# Patient Record
Sex: Female | Born: 1960 | Race: White | Hispanic: No | Smoking: Current every day smoker
Health system: Southern US, Community
[De-identification: ages and names within clinical notes are randomized; demographics above are authoritative.]

## PROBLEM LIST (undated history)

## (undated) DIAGNOSIS — J439 Emphysema, unspecified: Secondary | ICD-10-CM

## (undated) DIAGNOSIS — F419 Anxiety disorder, unspecified: Secondary | ICD-10-CM

## (undated) DIAGNOSIS — R51 Headache: Secondary | ICD-10-CM

## (undated) DIAGNOSIS — J45909 Unspecified asthma, uncomplicated: Secondary | ICD-10-CM

## (undated) DIAGNOSIS — K8010 Calculus of gallbladder with chronic cholecystitis without obstruction: Secondary | ICD-10-CM

## (undated) DIAGNOSIS — T8859XA Other complications of anesthesia, initial encounter: Secondary | ICD-10-CM

## (undated) DIAGNOSIS — R519 Headache, unspecified: Secondary | ICD-10-CM

## (undated) DIAGNOSIS — M1990 Unspecified osteoarthritis, unspecified site: Secondary | ICD-10-CM

## (undated) DIAGNOSIS — J449 Chronic obstructive pulmonary disease, unspecified: Secondary | ICD-10-CM

## (undated) DIAGNOSIS — I10 Essential (primary) hypertension: Secondary | ICD-10-CM

## (undated) DIAGNOSIS — K4090 Unilateral inguinal hernia, without obstruction or gangrene, not specified as recurrent: Secondary | ICD-10-CM

## (undated) DIAGNOSIS — Z8719 Personal history of other diseases of the digestive system: Secondary | ICD-10-CM

## (undated) DIAGNOSIS — T4145XA Adverse effect of unspecified anesthetic, initial encounter: Secondary | ICD-10-CM

## (undated) DIAGNOSIS — M7989 Other specified soft tissue disorders: Secondary | ICD-10-CM

## (undated) HISTORY — DX: Other specified soft tissue disorders: M79.89

## (undated) HISTORY — PX: TUBAL LIGATION: SHX77

## (undated) HISTORY — DX: Unilateral inguinal hernia, without obstruction or gangrene, not specified as recurrent: K40.90

## (undated) HISTORY — DX: Headache: R51

## (undated) HISTORY — DX: Essential (primary) hypertension: I10

## (undated) HISTORY — DX: Chronic obstructive pulmonary disease, unspecified: J44.9

## (undated) HISTORY — DX: Anxiety disorder, unspecified: F41.9

## (undated) HISTORY — DX: Personal history of other diseases of the digestive system: Z87.19

## (undated) HISTORY — DX: Calculus of gallbladder with chronic cholecystitis without obstruction: K80.10

## (undated) HISTORY — DX: Unspecified osteoarthritis, unspecified site: M19.90

## (undated) HISTORY — DX: Headache, unspecified: R51.9

---

## 1997-12-28 ENCOUNTER — Inpatient Hospital Stay (HOSPITAL_COMMUNITY): Admission: AD | Admit: 1997-12-28 | Discharge: 1997-12-28 | Payer: Self-pay | Admitting: *Deleted

## 1998-03-20 ENCOUNTER — Emergency Department (HOSPITAL_COMMUNITY): Admission: EM | Admit: 1998-03-20 | Discharge: 1998-03-20 | Payer: Self-pay | Admitting: Emergency Medicine

## 1998-03-20 ENCOUNTER — Encounter: Payer: Self-pay | Admitting: Emergency Medicine

## 1998-05-22 ENCOUNTER — Encounter: Admission: RE | Admit: 1998-05-22 | Discharge: 1998-05-22 | Payer: Self-pay | Admitting: Obstetrics

## 1998-07-13 ENCOUNTER — Emergency Department (HOSPITAL_COMMUNITY): Admission: EM | Admit: 1998-07-13 | Discharge: 1998-07-13 | Payer: Self-pay | Admitting: Emergency Medicine

## 1998-07-13 ENCOUNTER — Encounter: Payer: Self-pay | Admitting: Emergency Medicine

## 1998-07-15 ENCOUNTER — Encounter: Admission: RE | Admit: 1998-07-15 | Discharge: 1998-07-15 | Payer: Self-pay | Admitting: Internal Medicine

## 1998-07-29 ENCOUNTER — Encounter: Admission: RE | Admit: 1998-07-29 | Discharge: 1998-07-29 | Payer: Self-pay | Admitting: Hematology and Oncology

## 1999-04-03 ENCOUNTER — Ambulatory Visit (HOSPITAL_COMMUNITY): Admission: RE | Admit: 1999-04-03 | Discharge: 1999-04-03 | Payer: Self-pay | Admitting: *Deleted

## 1999-10-19 ENCOUNTER — Emergency Department (HOSPITAL_COMMUNITY): Admission: EM | Admit: 1999-10-19 | Discharge: 1999-10-19 | Payer: Self-pay | Admitting: Emergency Medicine

## 2000-02-17 ENCOUNTER — Encounter: Payer: Self-pay | Admitting: Emergency Medicine

## 2000-02-17 ENCOUNTER — Emergency Department (HOSPITAL_COMMUNITY): Admission: EM | Admit: 2000-02-17 | Discharge: 2000-02-17 | Payer: Self-pay | Admitting: Emergency Medicine

## 2000-10-23 ENCOUNTER — Emergency Department (HOSPITAL_COMMUNITY): Admission: EM | Admit: 2000-10-23 | Discharge: 2000-10-23 | Payer: Self-pay | Admitting: Emergency Medicine

## 2000-10-23 ENCOUNTER — Encounter: Payer: Self-pay | Admitting: Emergency Medicine

## 2000-11-17 ENCOUNTER — Inpatient Hospital Stay (HOSPITAL_COMMUNITY): Admission: AD | Admit: 2000-11-17 | Discharge: 2000-11-17 | Payer: Self-pay | Admitting: Obstetrics

## 2001-02-20 ENCOUNTER — Emergency Department (HOSPITAL_COMMUNITY): Admission: EM | Admit: 2001-02-20 | Discharge: 2001-02-20 | Payer: Self-pay | Admitting: Emergency Medicine

## 2001-02-20 ENCOUNTER — Encounter: Payer: Self-pay | Admitting: Emergency Medicine

## 2001-07-21 ENCOUNTER — Emergency Department (HOSPITAL_COMMUNITY): Admission: EM | Admit: 2001-07-21 | Discharge: 2001-07-21 | Payer: Self-pay | Admitting: Emergency Medicine

## 2001-10-23 ENCOUNTER — Emergency Department (HOSPITAL_COMMUNITY): Admission: EM | Admit: 2001-10-23 | Discharge: 2001-10-23 | Payer: Self-pay | Admitting: Emergency Medicine

## 2001-10-23 ENCOUNTER — Encounter: Payer: Self-pay | Admitting: Emergency Medicine

## 2002-01-03 ENCOUNTER — Emergency Department (HOSPITAL_COMMUNITY): Admission: EM | Admit: 2002-01-03 | Discharge: 2002-01-03 | Payer: Self-pay | Admitting: Emergency Medicine

## 2002-01-03 ENCOUNTER — Encounter: Payer: Self-pay | Admitting: Emergency Medicine

## 2002-02-10 ENCOUNTER — Encounter: Payer: Self-pay | Admitting: Emergency Medicine

## 2002-02-10 ENCOUNTER — Emergency Department (HOSPITAL_COMMUNITY): Admission: EM | Admit: 2002-02-10 | Discharge: 2002-02-10 | Payer: Self-pay

## 2002-02-20 ENCOUNTER — Ambulatory Visit (HOSPITAL_COMMUNITY): Admission: RE | Admit: 2002-02-20 | Discharge: 2002-02-20 | Payer: Self-pay | Admitting: *Deleted

## 2002-04-27 ENCOUNTER — Other Ambulatory Visit: Admission: RE | Admit: 2002-04-27 | Discharge: 2002-04-27 | Payer: Self-pay | Admitting: Family Medicine

## 2002-07-18 ENCOUNTER — Encounter: Payer: Self-pay | Admitting: Emergency Medicine

## 2002-07-18 ENCOUNTER — Emergency Department (HOSPITAL_COMMUNITY): Admission: EM | Admit: 2002-07-18 | Discharge: 2002-07-18 | Payer: Self-pay | Admitting: Emergency Medicine

## 2003-01-02 ENCOUNTER — Ambulatory Visit (HOSPITAL_COMMUNITY): Admission: RE | Admit: 2003-01-02 | Discharge: 2003-01-02 | Payer: Self-pay | Admitting: Internal Medicine

## 2003-07-02 ENCOUNTER — Emergency Department (HOSPITAL_COMMUNITY): Admission: AD | Admit: 2003-07-02 | Discharge: 2003-07-02 | Payer: Self-pay | Admitting: Family Medicine

## 2004-02-08 ENCOUNTER — Emergency Department (HOSPITAL_COMMUNITY): Admission: EM | Admit: 2004-02-08 | Discharge: 2004-02-08 | Payer: Self-pay | Admitting: Emergency Medicine

## 2004-03-24 ENCOUNTER — Ambulatory Visit: Payer: Self-pay | Admitting: Family Medicine

## 2004-03-31 ENCOUNTER — Ambulatory Visit (HOSPITAL_COMMUNITY): Admission: RE | Admit: 2004-03-31 | Discharge: 2004-03-31 | Payer: Self-pay | Admitting: Family Medicine

## 2004-04-03 ENCOUNTER — Emergency Department (HOSPITAL_COMMUNITY): Admission: EM | Admit: 2004-04-03 | Discharge: 2004-04-03 | Payer: Self-pay | Admitting: Emergency Medicine

## 2004-04-06 ENCOUNTER — Ambulatory Visit (HOSPITAL_COMMUNITY): Admission: RE | Admit: 2004-04-06 | Discharge: 2004-04-06 | Payer: Self-pay | Admitting: Internal Medicine

## 2004-04-13 ENCOUNTER — Encounter: Admission: RE | Admit: 2004-04-13 | Discharge: 2004-04-13 | Payer: Self-pay | Admitting: Family Medicine

## 2004-09-21 ENCOUNTER — Ambulatory Visit: Payer: Self-pay | Admitting: Family Medicine

## 2004-12-28 ENCOUNTER — Ambulatory Visit: Payer: Self-pay | Admitting: Family Medicine

## 2004-12-30 ENCOUNTER — Ambulatory Visit (HOSPITAL_COMMUNITY): Admission: RE | Admit: 2004-12-30 | Discharge: 2004-12-30 | Payer: Self-pay | Admitting: Internal Medicine

## 2005-03-15 ENCOUNTER — Ambulatory Visit: Payer: Self-pay | Admitting: Family Medicine

## 2005-03-15 ENCOUNTER — Other Ambulatory Visit: Admission: RE | Admit: 2005-03-15 | Discharge: 2005-03-15 | Payer: Self-pay | Admitting: Family Medicine

## 2005-04-07 ENCOUNTER — Encounter: Admission: RE | Admit: 2005-04-07 | Discharge: 2005-04-07 | Payer: Self-pay | Admitting: Internal Medicine

## 2005-04-21 ENCOUNTER — Encounter: Admission: RE | Admit: 2005-04-21 | Discharge: 2005-04-21 | Payer: Self-pay | Admitting: Internal Medicine

## 2005-09-13 ENCOUNTER — Ambulatory Visit: Payer: Self-pay | Admitting: Family Medicine

## 2006-01-07 ENCOUNTER — Ambulatory Visit: Payer: Self-pay | Admitting: Family Medicine

## 2006-09-01 ENCOUNTER — Ambulatory Visit: Payer: Self-pay | Admitting: Internal Medicine

## 2006-09-08 ENCOUNTER — Ambulatory Visit: Payer: Self-pay | Admitting: Family Medicine

## 2006-10-13 ENCOUNTER — Ambulatory Visit: Payer: Self-pay | Admitting: Family Medicine

## 2006-11-03 ENCOUNTER — Ambulatory Visit: Payer: Self-pay | Admitting: Internal Medicine

## 2006-11-15 ENCOUNTER — Ambulatory Visit: Payer: Self-pay | Admitting: Internal Medicine

## 2006-11-23 ENCOUNTER — Ambulatory Visit (HOSPITAL_COMMUNITY): Admission: RE | Admit: 2006-11-23 | Discharge: 2006-11-23 | Payer: Self-pay | Admitting: Family Medicine

## 2006-11-23 ENCOUNTER — Ambulatory Visit: Payer: Self-pay | Admitting: Internal Medicine

## 2007-09-02 ENCOUNTER — Emergency Department (HOSPITAL_COMMUNITY): Admission: EM | Admit: 2007-09-02 | Discharge: 2007-09-02 | Payer: Self-pay | Admitting: Family Medicine

## 2007-09-12 ENCOUNTER — Ambulatory Visit: Payer: Self-pay | Admitting: Internal Medicine

## 2007-11-03 ENCOUNTER — Ambulatory Visit (HOSPITAL_COMMUNITY): Admission: RE | Admit: 2007-11-03 | Discharge: 2007-11-03 | Payer: Self-pay | Admitting: Family Medicine

## 2008-02-05 ENCOUNTER — Emergency Department (HOSPITAL_COMMUNITY): Admission: EM | Admit: 2008-02-05 | Discharge: 2008-02-05 | Payer: Self-pay | Admitting: Emergency Medicine

## 2008-02-05 ENCOUNTER — Ambulatory Visit: Payer: Self-pay | Admitting: *Deleted

## 2008-06-14 HISTORY — PX: COLON SURGERY: SHX602

## 2008-06-14 HISTORY — PX: COLOSTOMY: SHX63

## 2008-06-28 ENCOUNTER — Emergency Department (HOSPITAL_COMMUNITY): Admission: EM | Admit: 2008-06-28 | Discharge: 2008-06-28 | Payer: Self-pay | Admitting: Family Medicine

## 2008-07-16 ENCOUNTER — Emergency Department (HOSPITAL_COMMUNITY): Admission: EM | Admit: 2008-07-16 | Discharge: 2008-07-16 | Payer: Self-pay | Admitting: Family Medicine

## 2008-08-19 ENCOUNTER — Encounter: Payer: Self-pay | Admitting: Internal Medicine

## 2008-08-19 ENCOUNTER — Ambulatory Visit: Payer: Self-pay | Admitting: Internal Medicine

## 2008-08-19 DIAGNOSIS — R109 Unspecified abdominal pain: Secondary | ICD-10-CM | POA: Insufficient documentation

## 2008-08-19 LAB — CONVERTED CEMR LAB
Alkaline Phosphatase: 75 units/L (ref 39–117)
BUN: 5 mg/dL — ABNORMAL LOW (ref 6–23)
Band Neutrophils: 0 % (ref 0–10)
Basophils Absolute: 0 10*3/uL (ref 0.0–0.1)
Chloride: 104 meq/L (ref 96–112)
Creatinine, Ser: 0.55 mg/dL (ref 0.40–1.20)
Eosinophils Absolute: 0.4 10*3/uL (ref 0.0–0.7)
Eosinophils Relative: 3 % (ref 0–5)
Glucose, Bld: 81 mg/dL (ref 70–99)
Hemoglobin: 13.5 g/dL (ref 12.0–15.0)
Lymphocytes Relative: 21 % (ref 12–46)
Lymphs Abs: 2.8 10*3/uL (ref 0.7–4.0)
Monocytes Absolute: 1 10*3/uL (ref 0.1–1.0)
Neutrophils Relative %: 67 % (ref 43–77)
Platelets: 332 10*3/uL (ref 150–400)
RDW: 14.4 % (ref 11.5–15.5)
Sodium: 142 meq/L (ref 135–145)
Total Bilirubin: 0.3 mg/dL (ref 0.3–1.2)
WBC: 13 10*3/uL — ABNORMAL HIGH (ref 4.0–10.5)

## 2008-08-20 ENCOUNTER — Telehealth: Payer: Self-pay | Admitting: Internal Medicine

## 2008-08-21 ENCOUNTER — Ambulatory Visit (HOSPITAL_COMMUNITY): Admission: RE | Admit: 2008-08-21 | Discharge: 2008-08-21 | Payer: Self-pay | Admitting: Internal Medicine

## 2008-09-10 ENCOUNTER — Ambulatory Visit: Payer: Self-pay | Admitting: Family Medicine

## 2008-09-11 ENCOUNTER — Encounter: Payer: Self-pay | Admitting: Internal Medicine

## 2008-09-16 ENCOUNTER — Ambulatory Visit: Payer: Self-pay | Admitting: Family Medicine

## 2008-09-16 ENCOUNTER — Encounter (INDEPENDENT_AMBULATORY_CARE_PROVIDER_SITE_OTHER): Payer: Self-pay | Admitting: Adult Health

## 2008-09-16 LAB — CONVERTED CEMR LAB
ALT: 14 units/L (ref 0–35)
AST: 15 units/L (ref 0–37)
Alkaline Phosphatase: 71 units/L (ref 39–117)
CO2: 24 meq/L (ref 19–32)
Chloride: 111 meq/L (ref 96–112)
Cholesterol: 220 mg/dL — ABNORMAL HIGH (ref 0–200)
Creatinine, Ser: 0.66 mg/dL (ref 0.40–1.20)
Eosinophils Absolute: 0.4 10*3/uL (ref 0.0–0.7)
Eosinophils Relative: 5 % (ref 0–5)
Glucose, Bld: 78 mg/dL (ref 70–99)
HDL: 47 mg/dL (ref >39)
LDL Cholesterol: 150 mg/dL — ABNORMAL HIGH (ref 0–99)
Lymphocytes Relative: 35 % (ref 12–46)
Lymphs Abs: 3 10*3/uL (ref 0.7–4.0)
MCV: 89 fL (ref 78.0–100.0)
Monocytes Relative: 9 % (ref 3–12)
Potassium: 4.7 meq/L (ref 3.5–5.3)
Total CHOL/HDL Ratio: 4.7

## 2008-12-04 ENCOUNTER — Encounter (INDEPENDENT_AMBULATORY_CARE_PROVIDER_SITE_OTHER): Payer: Self-pay | Admitting: Adult Health

## 2008-12-04 ENCOUNTER — Ambulatory Visit: Payer: Self-pay | Admitting: Internal Medicine

## 2008-12-04 LAB — CONVERTED CEMR LAB
ALT: 10 units/L (ref 0–35)
AST: 13 units/L (ref 0–37)
Basophils Relative: 0 % (ref 0–1)
Chloride: 107 meq/L (ref 96–112)
Eosinophils Absolute: 0.6 10*3/uL (ref 0.0–0.7)
GC Probe Amp, Genital: NEGATIVE
HCT: 42.6 % (ref 36.0–46.0)
MCHC: 32.4 g/dL (ref 30.0–36.0)
Monocytes Absolute: 0.7 10*3/uL (ref 0.1–1.0)
Potassium: 4.2 meq/L (ref 3.5–5.3)
Sodium: 141 meq/L (ref 135–145)
TSH: 1.337 microintl units/mL (ref 0.350–4.500)
Total Bilirubin: 0.3 mg/dL (ref 0.3–1.2)
Vit D, 25-Hydroxy: 20 ng/mL — ABNORMAL LOW (ref 30–89)

## 2008-12-11 ENCOUNTER — Emergency Department (HOSPITAL_COMMUNITY): Admission: EM | Admit: 2008-12-11 | Discharge: 2008-12-11 | Payer: Self-pay | Admitting: Emergency Medicine

## 2009-01-10 ENCOUNTER — Encounter: Admission: RE | Admit: 2009-01-10 | Discharge: 2009-01-10 | Payer: Self-pay | Admitting: Internal Medicine

## 2009-03-05 ENCOUNTER — Inpatient Hospital Stay (HOSPITAL_COMMUNITY): Admission: EM | Admit: 2009-03-05 | Discharge: 2009-03-12 | Payer: Self-pay | Admitting: Emergency Medicine

## 2009-03-05 ENCOUNTER — Encounter (INDEPENDENT_AMBULATORY_CARE_PROVIDER_SITE_OTHER): Payer: Self-pay | Admitting: General Surgery

## 2009-04-17 ENCOUNTER — Emergency Department (HOSPITAL_COMMUNITY): Admission: EM | Admit: 2009-04-17 | Discharge: 2009-04-17 | Payer: Self-pay | Admitting: Emergency Medicine

## 2009-05-26 ENCOUNTER — Ambulatory Visit: Payer: Self-pay | Admitting: Internal Medicine

## 2009-05-26 ENCOUNTER — Encounter (INDEPENDENT_AMBULATORY_CARE_PROVIDER_SITE_OTHER): Payer: Self-pay | Admitting: Adult Health

## 2009-05-26 LAB — CONVERTED CEMR LAB
Albumin: 4.6 g/dL (ref 3.5–5.2)
CO2: 23 meq/L (ref 19–32)
Calcium: 9.6 mg/dL (ref 8.4–10.5)
Chloride: 104 meq/L (ref 96–112)
Cholesterol: 179 mg/dL (ref 0–200)
Glucose, Bld: 94 mg/dL (ref 70–99)
LDL Cholesterol: 108 mg/dL — ABNORMAL HIGH (ref 0–99)
Sodium: 141 meq/L (ref 135–145)
Total CHOL/HDL Ratio: 3.7
Triglycerides: 117 mg/dL (ref <150)
VLDL: 23 mg/dL (ref 0–40)

## 2009-06-10 ENCOUNTER — Ambulatory Visit: Payer: Self-pay | Admitting: Internal Medicine

## 2009-06-10 ENCOUNTER — Encounter (INDEPENDENT_AMBULATORY_CARE_PROVIDER_SITE_OTHER): Payer: Self-pay | Admitting: Adult Health

## 2009-06-10 LAB — CONVERTED CEMR LAB
BUN: 8 mg/dL (ref 6–23)
Calcium: 9.9 mg/dL (ref 8.4–10.5)

## 2009-07-08 ENCOUNTER — Ambulatory Visit: Payer: Self-pay | Admitting: Internal Medicine

## 2009-07-14 ENCOUNTER — Inpatient Hospital Stay (HOSPITAL_COMMUNITY): Admission: RE | Admit: 2009-07-14 | Discharge: 2009-07-20 | Payer: Self-pay | Admitting: General Surgery

## 2009-07-14 ENCOUNTER — Encounter (INDEPENDENT_AMBULATORY_CARE_PROVIDER_SITE_OTHER): Payer: Self-pay | Admitting: General Surgery

## 2009-07-23 ENCOUNTER — Inpatient Hospital Stay (HOSPITAL_COMMUNITY): Admission: EM | Admit: 2009-07-23 | Discharge: 2009-07-30 | Payer: Self-pay | Admitting: Emergency Medicine

## 2009-08-06 ENCOUNTER — Encounter: Admission: RE | Admit: 2009-08-06 | Discharge: 2009-08-06 | Payer: Self-pay | Admitting: General Surgery

## 2009-08-13 ENCOUNTER — Encounter: Admission: RE | Admit: 2009-08-13 | Discharge: 2009-08-13 | Payer: Self-pay | Admitting: General Surgery

## 2009-09-02 ENCOUNTER — Encounter: Admission: RE | Admit: 2009-09-02 | Discharge: 2009-09-02 | Payer: Self-pay | Admitting: General Surgery

## 2009-09-04 ENCOUNTER — Ambulatory Visit (HOSPITAL_COMMUNITY): Admission: RE | Admit: 2009-09-04 | Discharge: 2009-09-04 | Payer: Self-pay | Admitting: General Surgery

## 2009-09-05 ENCOUNTER — Ambulatory Visit: Payer: Self-pay | Admitting: Internal Medicine

## 2009-09-05 ENCOUNTER — Encounter: Payer: Self-pay | Admitting: Cardiology

## 2009-09-15 ENCOUNTER — Ambulatory Visit (HOSPITAL_COMMUNITY): Admission: RE | Admit: 2009-09-15 | Discharge: 2009-09-15 | Payer: Self-pay | Admitting: General Surgery

## 2009-10-01 ENCOUNTER — Ambulatory Visit: Payer: Self-pay | Admitting: Cardiology

## 2009-10-01 DIAGNOSIS — Z720 Tobacco use: Secondary | ICD-10-CM | POA: Insufficient documentation

## 2009-10-01 DIAGNOSIS — I10 Essential (primary) hypertension: Secondary | ICD-10-CM | POA: Insufficient documentation

## 2009-10-01 DIAGNOSIS — R079 Chest pain, unspecified: Secondary | ICD-10-CM | POA: Insufficient documentation

## 2009-10-02 ENCOUNTER — Telehealth: Payer: Self-pay | Admitting: Cardiology

## 2009-10-02 ENCOUNTER — Telehealth (INDEPENDENT_AMBULATORY_CARE_PROVIDER_SITE_OTHER): Payer: Self-pay | Admitting: *Deleted

## 2009-10-07 LAB — CONVERTED CEMR LAB
BUN: 4 mg/dL — ABNORMAL LOW (ref 6–23)
CO2: 30 meq/L (ref 19–32)
Glucose, Bld: 86 mg/dL (ref 70–99)
Potassium: 3.6 meq/L (ref 3.5–5.1)
Sodium: 143 meq/L (ref 135–145)

## 2009-10-16 ENCOUNTER — Encounter (INDEPENDENT_AMBULATORY_CARE_PROVIDER_SITE_OTHER): Payer: Self-pay | Admitting: *Deleted

## 2009-10-16 ENCOUNTER — Ambulatory Visit: Payer: Self-pay | Admitting: Cardiology

## 2009-10-16 ENCOUNTER — Ambulatory Visit: Payer: Self-pay

## 2010-01-03 ENCOUNTER — Emergency Department (HOSPITAL_COMMUNITY): Admission: EM | Admit: 2010-01-03 | Discharge: 2010-01-04 | Payer: Self-pay | Admitting: Emergency Medicine

## 2010-04-07 ENCOUNTER — Emergency Department (HOSPITAL_COMMUNITY): Admission: EM | Admit: 2010-04-07 | Discharge: 2010-04-07 | Payer: Self-pay | Admitting: Family Medicine

## 2010-05-19 ENCOUNTER — Encounter (INDEPENDENT_AMBULATORY_CARE_PROVIDER_SITE_OTHER): Payer: Self-pay | Admitting: *Deleted

## 2010-05-19 LAB — CONVERTED CEMR LAB
ALT: 12 units/L (ref 0–35)
Albumin: 4.4 g/dL (ref 3.5–5.2)
Alkaline Phosphatase: 73 units/L (ref 39–117)
BUN: 7 mg/dL (ref 6–23)
Basophils Absolute: 0 10*3/uL (ref 0.0–0.1)
Basophils Relative: 1 % (ref 0–1)
CO2: 22 meq/L (ref 19–32)
CRP: 0.2 mg/dL (ref <0.6)
Calcium: 9.4 mg/dL (ref 8.4–10.5)
Chloride: 109 meq/L (ref 96–112)
Eosinophils Absolute: 0.5 10*3/uL (ref 0.0–0.7)
Eosinophils Relative: 6 % — ABNORMAL HIGH (ref 0–5)
HCT: 44.7 % (ref 36.0–46.0)
Lymphs Abs: 2.4 10*3/uL (ref 0.7–4.0)
MCHC: 32.4 g/dL (ref 30.0–36.0)
MCV: 86.3 fL (ref 78.0–100.0)
Monocytes Absolute: 0.5 10*3/uL (ref 0.1–1.0)
Monocytes Relative: 6 % (ref 3–12)
Neutrophils Relative %: 59 % (ref 43–77)
Platelets: 340 10*3/uL (ref 150–400)
Total CK: 64 units/L (ref 7–177)
Total Protein: 6.8 g/dL (ref 6.0–8.3)

## 2010-07-05 ENCOUNTER — Encounter: Payer: Self-pay | Admitting: Family Medicine

## 2010-07-05 ENCOUNTER — Encounter: Payer: Self-pay | Admitting: General Surgery

## 2010-07-06 ENCOUNTER — Encounter: Payer: Self-pay | Admitting: Internal Medicine

## 2010-07-14 NOTE — Progress Notes (Signed)
Summary: Reseach update  Pt was randomized to stress test for Promise Study. Do you want any meds held prior to treadmill. Pt is on HCTZ 25mg  once daily. Loletha Carrow  Appended Document: Reseach update do not need to hold HCTZ

## 2010-07-14 NOTE — Progress Notes (Signed)
Summary: Stress test instructions  Patient notified by phone that received stress test for Promise Study.  Stress test is ordered for Oct 16, 2009 at 2 pm. All instructions for  stress test was given to patient. Patient verbalized understanding. Loletha Carrow

## 2010-07-14 NOTE — Miscellaneous (Signed)
Summary: Orders Update  Clinical Lists Changes  Problems: Added new problem of CHEST PAIN (ICD-786.50) Orders: Added new Test order of TLB-BMP (Basic Metabolic Panel-BMET) (80048-METABOL) - Signed

## 2010-07-14 NOTE — Consult Note (Signed)
Summary: [facility]   Imported By: Marylou Mccoy 11/26/2009 15:02:36  _____________________________________________________________________  External Attachment:    Type:   Image     Comment:   External Document

## 2010-07-14 NOTE — Assessment & Plan Note (Signed)
Summary: np6/intermittent chestpain/ h/o tobacco use./ pt has medicaid...   Primary Provider:  Dr. Rudean Curt  CC:  new patient/intermittent chest pain/pt has not taken her medication yet today.  History of Present Illness: 50 yo with history of HTN, smoking, and hyperlipidemia presents for evaluation of chest pain.  She has had episodes of tightness in her chest for about 6 months.  Nothing in particular brings it on.  It is not exertional.  She gets it a couple of times a week.  It is not related to meals either.  Pain will last about 10 minutes then resolve.  It is of moderate intensity.  She gets mild exertional dyspnea such as after climbing a flight of steps.  She attributes this to smoking.  She does have a history of premature CAD in her mother.   ECG: NSR, Qs in V1 and V2 (nonspecific finding)  Labs (12/10): creatinine 0.66, LDL 108, HDL 48  Current Medications (verified): 1)  Hydrochlorothiazide 25 Mg Tabs (Hydrochlorothiazide) .... Take One Tablet Once Daily  Allergies (verified): 1)  ! Penicillin 2)  ! Codeine 3)  ! Aspirin  Past History:  Past Medical History: 1. HTN 2. Smokes 1 ppd 3. Hyperlipidemia 4. Colonic perforation with polypectomy in 2010.  Had colostomy that has been taken down.  5. Diverticulitis in 2010 6. 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   Vital Signs:  Patient profile:   50 year old female Height:      64 inches Weight:      151 pounds BMI:     26.01 Pulse rate:   80 / minute Pulse rhythm:   regular BP sitting:   132 / 82  (left arm) Cuff size:   regular  Vitals Entered By: Judithe Modest CMA (October 01, 2009 11:42 AM)  Physical Exam  General:  Well developed, well nourished, in no acute distress. Head:  normocephalic and atraumatic Nose:  no  deformity, discharge, inflammation, or lesions Mouth:  Teeth, gums and palate normal. Oral mucosa normal. Neck:  Neck supple, no JVD. No masses, thyromegaly or abnormal cervical nodes. Lungs:  Clear bilaterally to auscultation and percussion. Heart:  Non-displaced PMI, chest non-tender; regular rate and rhythm, S1, S2 without murmurs, rubs or gallops. Carotid upstroke normal, no bruit. Pedals normal pulses. No edema, no varicosities. Abdomen:  Bowel sounds positive; abdomen soft and non-tender without masses, organomegaly, or hernias noted. No hepatosplenomegaly. Msk:  Back normal, normal gait. Muscle strength and tone normal. Extremities:  No clubbing or cyanosis. Neurologic:  Alert and oriented x 3. Skin:  Intact without lesions or rashes. Psych:  Normal affect.   Impression & Recommendations:  Problem # 1:  CHEST PAIN (ICD-786.50) Atypical chest pain in a patient with multiple risk factors for CAD, including HTN, active smoking, hyperlipidemia, and family history of premature CAD in her mother.  I think that she merits risk stratification.  I have spoken with her about the PROMISE trial, which she is interested it.  This will randomize her to coronary CT angiogram or stress myoview. I will have her start ASA 81 mg daily.   Problem # 2:  SMOKER (ICD-305.1) Patient is interested in quitting.  I gave her a prescription for Chantix and encouraged her to quit smoking.  Problem # 3:  HYPERTENSION, UNSPECIFIED (ICD-401.9) BP is reasonably controlled on HCTZ.   Patient Instructions: 1)  Your physician has recommended you make the following change in your medication:  START ASPIRIN 81 MG  TAKE 1 TABLET A DAY.  THIS SHOULD BE BUFFERED OR COATED. 2)  START CHANTIX TO HELP YOU STOP SMOKING. 3)  Your physician discussed the hazards of tobacco use.  Tobacco use cessation is recommended and techniques and options to help you quit were discussed. 4)  TESTING PER PROMISE STUDY 5)  Your physician  recommends that you schedule a follow-up appointment in: 2-3 WEEKS WITH DR. Shirlee Latch AFTER TESTING IS COMPLETED. Prescriptions: CHANTIX CONTINUING MONTH PAK 1 MG TABS (VARENICLINE TARTRATE) TAKE AS DIRECTED  #1 x 1   Entered by:   Lisabeth Devoid RN   Authorized by:   Marca Ancona, MD   Signed by:   Lisabeth Devoid RN on 10/01/2009   Method used:   Faxed to ...       [facility] (retail)       [address] [place], [place]  [address]       Ph: [phone]       Fax: [phone]   RxID:   [id number]

## 2010-07-14 NOTE — Letter (Signed)
Summary: Outpatient Coinsurance Notice  Outpatient Coinsurance Notice   Imported By: Marylou Mccoy 10/17/2009 16:27:38  _____________________________________________________________________  External Attachment:    Type:   Image     Comment:   External Document

## 2010-07-30 ENCOUNTER — Ambulatory Visit (HOSPITAL_BASED_OUTPATIENT_CLINIC_OR_DEPARTMENT_OTHER): Payer: Self-pay

## 2010-08-29 LAB — URINE MICROSCOPIC-ADD ON

## 2010-08-29 LAB — URINALYSIS, ROUTINE W REFLEX MICROSCOPIC
Bilirubin Urine: NEGATIVE
Glucose, UA: NEGATIVE mg/dL
Ketones, ur: 15 mg/dL — AB
Nitrite: NEGATIVE
Protein, ur: NEGATIVE mg/dL
Specific Gravity, Urine: 1.029 (ref 1.005–1.030)

## 2010-08-29 LAB — RAPID URINE DRUG SCREEN, HOSP PERFORMED
Amphetamines: NOT DETECTED
Barbiturates: NOT DETECTED
Tetrahydrocannabinol: POSITIVE — AB

## 2010-08-29 LAB — DIFFERENTIAL
Lymphocytes Relative: 30 % (ref 12–46)
Lymphs Abs: 2.3 10*3/uL (ref 0.7–4.0)
Monocytes Absolute: 1 10*3/uL (ref 0.1–1.0)

## 2010-08-29 LAB — CBC
MCH: 28.4 pg (ref 26.0–34.0)
Platelets: 271 10*3/uL (ref 150–400)
RDW: 15.4 % (ref 11.5–15.5)

## 2010-08-29 LAB — COMPREHENSIVE METABOLIC PANEL
ALT: 14 U/L (ref 0–35)
Albumin: 4 g/dL (ref 3.5–5.2)
Alkaline Phosphatase: 71 U/L (ref 39–117)
CO2: 28 mEq/L (ref 19–32)
Calcium: 9.6 mg/dL (ref 8.4–10.5)
Creatinine, Ser: 0.76 mg/dL (ref 0.4–1.2)
Total Bilirubin: 0.5 mg/dL (ref 0.3–1.2)

## 2010-08-30 LAB — CBC
HCT: 44 % (ref 36.0–46.0)
Hemoglobin: 15.2 g/dL — ABNORMAL HIGH (ref 12.0–15.0)
WBC: 8.3 10*3/uL (ref 4.0–10.5)

## 2010-08-30 LAB — DIFFERENTIAL
Eosinophils Relative: 6 % — ABNORMAL HIGH (ref 0–5)
Lymphocytes Relative: 42 % (ref 12–46)
Lymphs Abs: 3.4 10*3/uL (ref 0.7–4.0)
Monocytes Absolute: 0.7 10*3/uL (ref 0.1–1.0)
Monocytes Relative: 9 % (ref 3–12)

## 2010-08-30 LAB — BASIC METABOLIC PANEL
Chloride: 100 mEq/L (ref 96–112)
GFR calc non Af Amer: >60 mL/min (ref >60)
Potassium: 3.5 mEq/L (ref 3.5–5.1)
Sodium: 136 mEq/L (ref 135–145)

## 2010-09-02 LAB — CBC
HCT: 31 % — ABNORMAL LOW (ref 36.0–46.0)
HCT: 31.4 % — ABNORMAL LOW (ref 36.0–46.0)
HCT: 31.8 % — ABNORMAL LOW (ref 36.0–46.0)
Hemoglobin: 10.4 g/dL — ABNORMAL LOW (ref 12.0–15.0)
Hemoglobin: 10.5 g/dL — ABNORMAL LOW (ref 12.0–15.0)
Hemoglobin: 10.8 g/dL — ABNORMAL LOW (ref 12.0–15.0)
Hemoglobin: 10.8 g/dL — ABNORMAL LOW (ref 12.0–15.0)
Hemoglobin: 10.9 g/dL — ABNORMAL LOW (ref 12.0–15.0)
MCHC: 34.3 g/dL (ref 30.0–36.0)
MCHC: 34.4 g/dL (ref 30.0–36.0)
MCHC: 34.5 g/dL (ref 30.0–36.0)
MCHC: 34.6 g/dL (ref 30.0–36.0)
MCHC: 34.6 g/dL (ref 30.0–36.0)
MCV: 84.8 fL (ref 78.0–100.0)
MCV: 85.1 fL (ref 78.0–100.0)
MCV: 86 fL (ref 78.0–100.0)
Platelets: 310 10*3/uL (ref 150–400)
Platelets: 490 10*3/uL — ABNORMAL HIGH (ref 150–400)
Platelets: 531 10*3/uL — ABNORMAL HIGH (ref 150–400)
RBC: 3.57 MIL/uL — ABNORMAL LOW (ref 3.87–5.11)
RBC: 3.62 MIL/uL — ABNORMAL LOW (ref 3.87–5.11)
RBC: 3.65 MIL/uL — ABNORMAL LOW (ref 3.87–5.11)
RBC: 3.73 MIL/uL — ABNORMAL LOW (ref 3.87–5.11)
RBC: 3.96 MIL/uL (ref 3.87–5.11)
RBC: 3.98 MIL/uL (ref 3.87–5.11)
RDW: 15.2 % (ref 11.5–15.5)
RDW: 15.2 % (ref 11.5–15.5)
RDW: 15.8 % — ABNORMAL HIGH (ref 11.5–15.5)
WBC: 12.2 10*3/uL — ABNORMAL HIGH (ref 4.0–10.5)
WBC: 17.5 10*3/uL — ABNORMAL HIGH (ref 4.0–10.5)
WBC: 9.8 10*3/uL (ref 4.0–10.5)

## 2010-09-02 LAB — BASIC METABOLIC PANEL
BUN: 1 mg/dL — ABNORMAL LOW (ref 6–23)
BUN: 10 mg/dL (ref 6–23)
BUN: 6 mg/dL (ref 6–23)
CO2: 23 mEq/L (ref 19–32)
CO2: 25 mEq/L (ref 19–32)
CO2: 25 mEq/L (ref 19–32)
CO2: 26 mEq/L (ref 19–32)
Calcium: 8.4 mg/dL (ref 8.4–10.5)
Calcium: 8.5 mg/dL (ref 8.4–10.5)
Calcium: 8.9 mg/dL (ref 8.4–10.5)
Chloride: 102 mEq/L (ref 96–112)
Chloride: 104 mEq/L (ref 96–112)
Chloride: 105 mEq/L (ref 96–112)
Chloride: 107 mEq/L (ref 96–112)
Creatinine, Ser: 0.51 mg/dL (ref 0.4–1.2)
GFR calc Af Amer: >60 mL/min (ref >60)
GFR calc Af Amer: >60 mL/min (ref >60)
GFR calc non Af Amer: >60 mL/min (ref >60)
Glucose, Bld: 102 mg/dL — ABNORMAL HIGH (ref 70–99)
Glucose, Bld: 107 mg/dL — ABNORMAL HIGH (ref 70–99)
Glucose, Bld: 109 mg/dL — ABNORMAL HIGH (ref 70–99)
Glucose, Bld: 126 mg/dL — ABNORMAL HIGH (ref 70–99)
Glucose, Bld: 139 mg/dL — ABNORMAL HIGH (ref 70–99)
Glucose, Bld: 90 mg/dL (ref 70–99)
Potassium: 4 mEq/L (ref 3.5–5.1)
Potassium: 4.3 mEq/L (ref 3.5–5.1)
Potassium: 4.4 mEq/L (ref 3.5–5.1)
Potassium: 4.9 mEq/L (ref 3.5–5.1)
Sodium: 133 mEq/L — ABNORMAL LOW (ref 135–145)
Sodium: 136 mEq/L (ref 135–145)
Sodium: 138 mEq/L (ref 135–145)
Sodium: 140 mEq/L (ref 135–145)

## 2010-09-02 LAB — GLUCOSE, CAPILLARY
Glucose-Capillary: 104 mg/dL — ABNORMAL HIGH (ref 70–99)
Glucose-Capillary: 110 mg/dL — ABNORMAL HIGH (ref 70–99)
Glucose-Capillary: 133 mg/dL — ABNORMAL HIGH (ref 70–99)
Glucose-Capillary: 150 mg/dL — ABNORMAL HIGH (ref 70–99)

## 2010-09-02 LAB — CHOLESTEROL, TOTAL
Cholesterol: 103 mg/dL (ref 0–200)
Cholesterol: 140 mg/dL (ref 0–200)

## 2010-09-02 LAB — CULTURE, ROUTINE-ABSCESS

## 2010-09-02 LAB — DIFFERENTIAL
Basophils Absolute: 0 10*3/uL (ref 0.0–0.1)
Eosinophils Absolute: 0.4 10*3/uL (ref 0.0–0.7)
Eosinophils Relative: 0 % (ref 0–5)
Eosinophils Relative: 5 % (ref 0–5)
Lymphocytes Relative: 26 % (ref 12–46)
Lymphocytes Relative: 8 % — ABNORMAL LOW (ref 12–46)
Lymphs Abs: 1.4 10*3/uL (ref 0.7–4.0)
Lymphs Abs: 1.4 10*3/uL (ref 0.7–4.0)
Lymphs Abs: 2.2 10*3/uL (ref 0.7–4.0)
Monocytes Absolute: 0.7 10*3/uL (ref 0.1–1.0)
Monocytes Relative: 6 % (ref 3–12)
Monocytes Relative: 9 % (ref 3–12)
Neutro Abs: 14.9 10*3/uL — ABNORMAL HIGH (ref 1.7–7.7)
Neutro Abs: 9.9 10*3/uL — ABNORMAL HIGH (ref 1.7–7.7)
Neutrophils Relative %: 81 % — ABNORMAL HIGH (ref 43–77)
Neutrophils Relative %: 85 % — ABNORMAL HIGH (ref 43–77)

## 2010-09-02 LAB — WOUND CULTURE

## 2010-09-02 LAB — ANAEROBIC CULTURE

## 2010-09-02 LAB — COMPREHENSIVE METABOLIC PANEL
ALT: 19 U/L (ref 0–35)
ALT: 25 U/L (ref 0–35)
AST: 16 U/L (ref 0–37)
AST: 17 U/L (ref 0–37)
Albumin: 2.6 g/dL — ABNORMAL LOW (ref 3.5–5.2)
Albumin: 2.7 g/dL — ABNORMAL LOW (ref 3.5–5.2)
Alkaline Phosphatase: 87 U/L (ref 39–117)
CO2: 26 mEq/L (ref 19–32)
Calcium: 8.8 mg/dL (ref 8.4–10.5)
Creatinine, Ser: 0.41 mg/dL (ref 0.4–1.2)
GFR calc Af Amer: >60 mL/min (ref >60)
GFR calc non Af Amer: >60 mL/min (ref >60)
Potassium: 3.8 mEq/L (ref 3.5–5.1)
Sodium: 133 mEq/L — ABNORMAL LOW (ref 135–145)
Sodium: 138 mEq/L (ref 135–145)
Total Protein: 5.9 g/dL — ABNORMAL LOW (ref 6.0–8.3)
Total Protein: 6 g/dL (ref 6.0–8.3)

## 2010-09-02 LAB — APTT: aPTT: 37 seconds (ref 24–37)

## 2010-09-02 LAB — TRIGLYCERIDES: Triglycerides: 125 mg/dL (ref <150)

## 2010-09-02 LAB — URINALYSIS, ROUTINE W REFLEX MICROSCOPIC
Glucose, UA: NEGATIVE mg/dL
Hgb urine dipstick: NEGATIVE
Nitrite: NEGATIVE
Protein, ur: NEGATIVE mg/dL
Urobilinogen, UA: 0.2 mg/dL (ref 0.0–1.0)

## 2010-09-02 LAB — PHOSPHORUS
Phosphorus: 3.3 mg/dL (ref 2.3–4.6)
Phosphorus: 4.4 mg/dL (ref 2.3–4.6)

## 2010-09-02 LAB — FUNGUS CULTURE W SMEAR

## 2010-09-02 LAB — MAGNESIUM
Magnesium: 1.9 mg/dL (ref 1.5–2.5)
Magnesium: 2.3 mg/dL (ref 1.5–2.5)

## 2010-09-02 LAB — PROTIME-INR
INR: 1.25 (ref 0.00–1.49)
Prothrombin Time: 15.6 seconds — ABNORMAL HIGH (ref 11.6–15.2)

## 2010-09-16 LAB — POCT URINALYSIS DIP (DEVICE)
Glucose, UA: NEGATIVE mg/dL
Specific Gravity, Urine: 1.02 (ref 1.005–1.030)

## 2010-09-16 LAB — GC/CHLAMYDIA PROBE AMP, GENITAL: GC Probe Amp, Genital: NEGATIVE

## 2010-09-16 LAB — URINE MICROSCOPIC-ADD ON

## 2010-09-16 LAB — WET PREP, GENITAL
Clue Cells Wet Prep HPF POC: NONE SEEN
Yeast Wet Prep HPF POC: NONE SEEN

## 2010-09-16 LAB — URINE CULTURE

## 2010-09-16 LAB — URINALYSIS, ROUTINE W REFLEX MICROSCOPIC
Glucose, UA: NEGATIVE mg/dL
Nitrite: NEGATIVE
Specific Gravity, Urine: 1.029 (ref 1.005–1.030)
pH: 6 (ref 5.0–8.0)

## 2010-09-16 LAB — POCT PREGNANCY, URINE: Preg Test, Ur: NEGATIVE

## 2010-09-18 LAB — URINALYSIS, ROUTINE W REFLEX MICROSCOPIC
Nitrite: NEGATIVE
Specific Gravity, Urine: 1.018 (ref 1.005–1.030)
Urobilinogen, UA: 0.2 mg/dL (ref 0.0–1.0)

## 2010-09-18 LAB — BASIC METABOLIC PANEL
BUN: 2 mg/dL — ABNORMAL LOW (ref 6–23)
BUN: 4 mg/dL — ABNORMAL LOW (ref 6–23)
BUN: <1 mg/dL — ABNORMAL LOW (ref 6–23)
CO2: 25 mEq/L (ref 19–32)
CO2: 26 mEq/L (ref 19–32)
CO2: 28 mEq/L (ref 19–32)
Calcium: 7.8 mg/dL — ABNORMAL LOW (ref 8.4–10.5)
Calcium: 7.9 mg/dL — ABNORMAL LOW (ref 8.4–10.5)
Calcium: 8.5 mg/dL (ref 8.4–10.5)
Chloride: 103 mEq/L (ref 96–112)
Creatinine, Ser: 0.5 mg/dL (ref 0.4–1.2)
Creatinine, Ser: 0.53 mg/dL (ref 0.4–1.2)
Creatinine, Ser: 0.59 mg/dL (ref 0.4–1.2)
Creatinine, Ser: 0.67 mg/dL (ref 0.4–1.2)
GFR calc Af Amer: >60 mL/min (ref >60)
GFR calc Af Amer: >60 mL/min (ref >60)
GFR calc non Af Amer: >60 mL/min (ref >60)
Glucose, Bld: 127 mg/dL — ABNORMAL HIGH (ref 70–99)
Potassium: 3.3 mEq/L — ABNORMAL LOW (ref 3.5–5.1)

## 2010-09-18 LAB — COMPREHENSIVE METABOLIC PANEL
AST: 18 U/L (ref 0–37)
CO2: 22 mEq/L (ref 19–32)
Calcium: 9.5 mg/dL (ref 8.4–10.5)
Creatinine, Ser: 0.66 mg/dL (ref 0.4–1.2)
GFR calc Af Amer: >60 mL/min (ref >60)
GFR calc non Af Amer: >60 mL/min (ref >60)
Total Protein: 7 g/dL (ref 6.0–8.3)

## 2010-09-18 LAB — CBC
HCT: 37.3 % (ref 36.0–46.0)
MCHC: 33.3 g/dL (ref 30.0–36.0)
MCHC: 33.9 g/dL (ref 30.0–36.0)
MCHC: 34 g/dL (ref 30.0–36.0)
MCV: 87.3 fL (ref 78.0–100.0)
MCV: 87.5 fL (ref 78.0–100.0)
Platelets: 261 10*3/uL (ref 150–400)
Platelets: 275 10*3/uL (ref 150–400)
Platelets: 316 10*3/uL (ref 150–400)
Platelets: 338 10*3/uL (ref 150–400)
RBC: 3.86 MIL/uL — ABNORMAL LOW (ref 3.87–5.11)
RBC: 5.05 MIL/uL (ref 3.87–5.11)
RDW: 14.4 % (ref 11.5–15.5)
RDW: 15.1 % (ref 11.5–15.5)
WBC: 11.9 10*3/uL — ABNORMAL HIGH (ref 4.0–10.5)

## 2010-09-18 LAB — MAGNESIUM
Magnesium: 1.9 mg/dL (ref 1.5–2.5)
Magnesium: 1.9 mg/dL (ref 1.5–2.5)

## 2010-09-18 LAB — DIFFERENTIAL
Eosinophils Relative: 6 % — ABNORMAL HIGH (ref 0–5)
Lymphocytes Relative: 36 % (ref 12–46)
Lymphs Abs: 3.5 10*3/uL (ref 0.7–4.0)

## 2010-09-18 LAB — URINE CULTURE

## 2010-09-18 LAB — TYPE AND SCREEN
ABO/RH(D): O POS
Antibody Screen: NEGATIVE

## 2010-09-18 LAB — URINE MICROSCOPIC-ADD ON

## 2010-09-18 LAB — PHOSPHORUS
Phosphorus: 1.5 mg/dL — ABNORMAL LOW (ref 2.3–4.6)
Phosphorus: 3.3 mg/dL (ref 2.3–4.6)

## 2010-09-18 LAB — POTASSIUM: Potassium: 3.6 mEq/L (ref 3.5–5.1)

## 2010-09-18 LAB — POCT PREGNANCY, URINE: Preg Test, Ur: NEGATIVE

## 2010-09-18 LAB — LIPASE, BLOOD: Lipase: 15 U/L (ref 11–59)

## 2010-09-21 LAB — POCT I-STAT, CHEM 8
BUN: 8 mg/dL (ref 6–23)
Chloride: 106 mEq/L (ref 96–112)
Creatinine, Ser: 0.7 mg/dL (ref 0.4–1.2)
Sodium: 141 mEq/L (ref 135–145)
TCO2: 23 mmol/L (ref 0–100)

## 2010-09-21 LAB — CBC
HCT: 40 % (ref 36.0–46.0)
Hemoglobin: 13.5 g/dL (ref 12.0–15.0)
Platelets: 271 10*3/uL (ref 150–400)
RDW: 14.4 % (ref 11.5–15.5)
WBC: 13.4 10*3/uL — ABNORMAL HIGH (ref 4.0–10.5)

## 2010-09-21 LAB — DIFFERENTIAL
Basophils Absolute: 0.1 10*3/uL (ref 0.0–0.1)
Eosinophils Relative: 4 % (ref 0–5)
Lymphocytes Relative: 23 % (ref 12–46)
Lymphs Abs: 3.1 10*3/uL (ref 0.7–4.0)
Monocytes Absolute: 0.9 10*3/uL (ref 0.1–1.0)
Neutro Abs: 8.8 10*3/uL — ABNORMAL HIGH (ref 1.7–7.7)

## 2010-09-21 LAB — URINALYSIS, ROUTINE W REFLEX MICROSCOPIC
Ketones, ur: NEGATIVE mg/dL
Leukocytes, UA: NEGATIVE
Nitrite: NEGATIVE
Protein, ur: NEGATIVE mg/dL
Urobilinogen, UA: 0.2 mg/dL (ref 0.0–1.0)

## 2010-09-21 LAB — URINE CULTURE: Culture: NO GROWTH

## 2010-09-21 LAB — URINE MICROSCOPIC-ADD ON

## 2010-09-28 LAB — POCT URINALYSIS DIP (DEVICE)
Glucose, UA: 100 mg/dL — AB
Specific Gravity, Urine: >=1.03 (ref 1.005–1.030)
pH: 5.5 (ref 5.0–8.0)

## 2010-09-29 LAB — POCT URINALYSIS DIP (DEVICE)
Bilirubin Urine: NEGATIVE
Hgb urine dipstick: NEGATIVE
Ketones, ur: NEGATIVE mg/dL
Protein, ur: NEGATIVE mg/dL
Specific Gravity, Urine: 1.02 (ref 1.005–1.030)
pH: 7 (ref 5.0–8.0)

## 2011-02-14 IMAGING — CT CT ABDOMEN W/ CM
2 of 6 series · 16 of 46 positions shown, 18 images · IV contrast (agent unspecified)
Comparison: 12/11/2008

CT ABDOMEN

CLINICAL DATA: Abdominal pain

CT ABDOMEN AND PELVIS WITH CONTRAST
TECHNIQUE: Multidetector CT imaging of the abdomen and pelvis was
performed using the standard protocol following bolus
administration of intravenous contrast.
Contrast: 80 ml of omni 300

[Series 2: abd pelvis · axial · 0.83mm/px · z∈[-429,-49]mm · 13 of 86 slices shown, 15 images]
[im 5/86  soft-tissue]
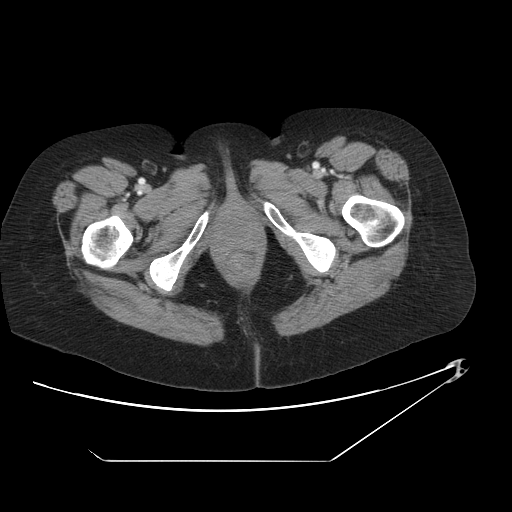
[im 5/86  bone]
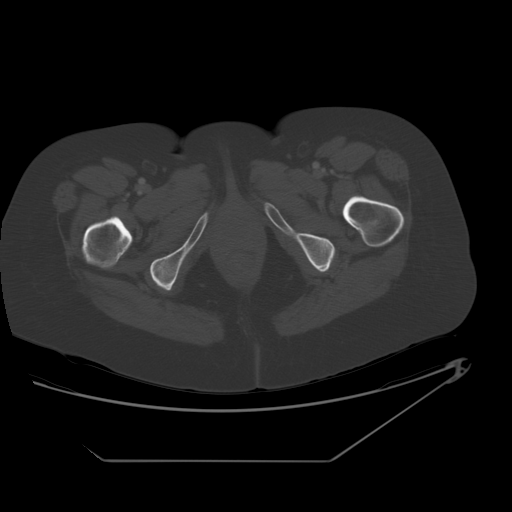
[im 14/86  soft-tissue]
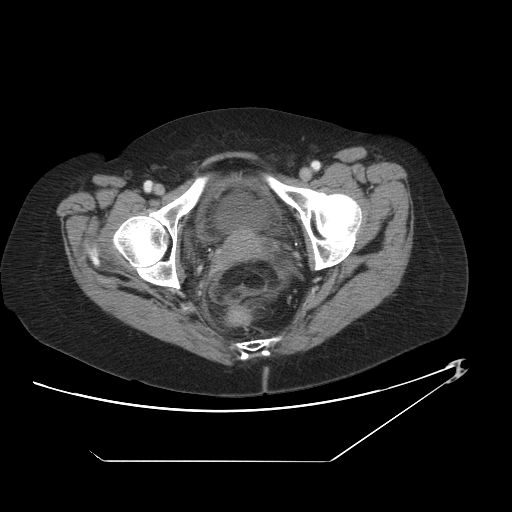
[im 18/86  soft-tissue]
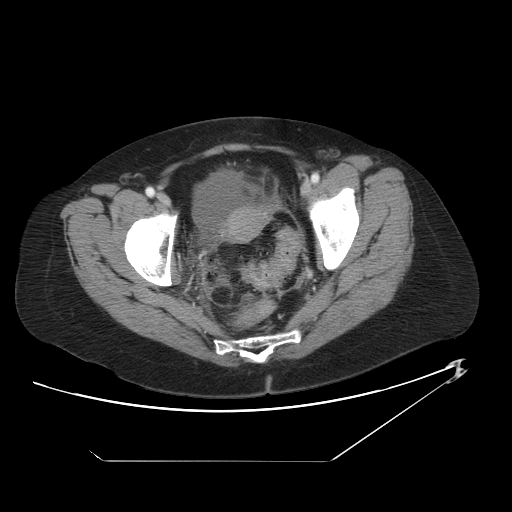
[im 23/86  soft-tissue]
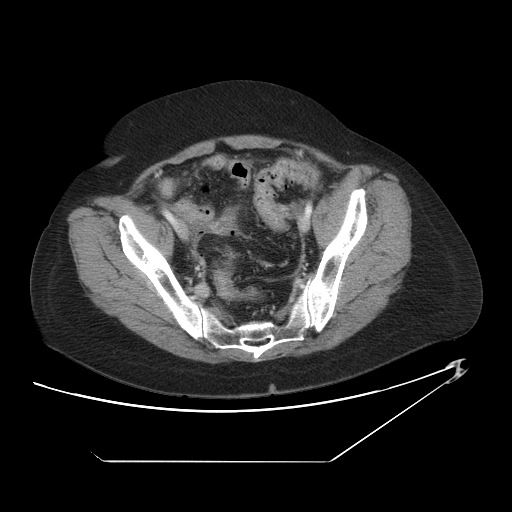
[im 32/86  soft-tissue]
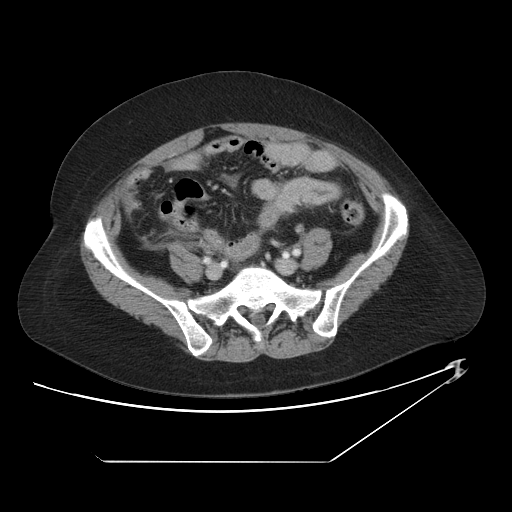
[im 36/86  soft-tissue]
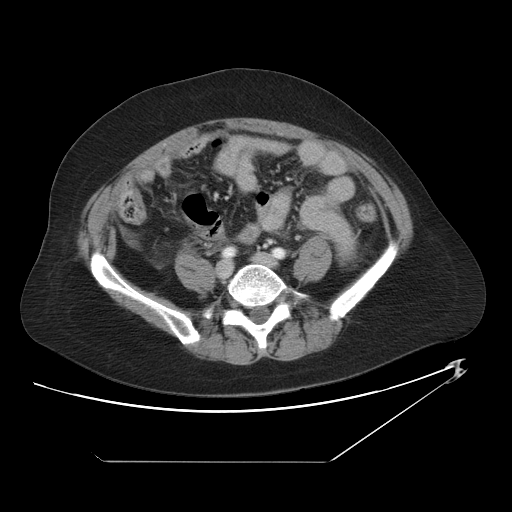
[im 45/86  soft-tissue]
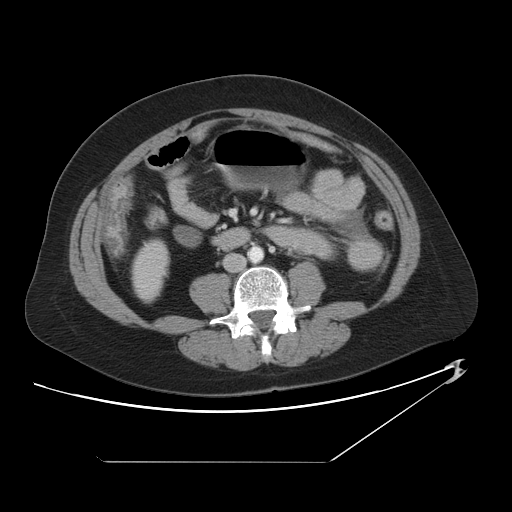
[im 50/86  soft-tissue]
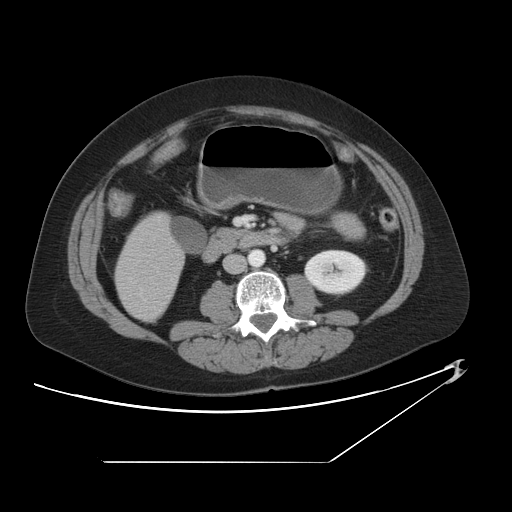
[im 54/86  soft-tissue]
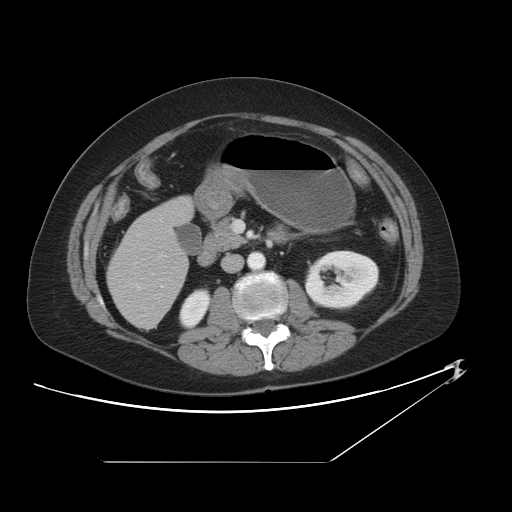
[im 54/86  bone]
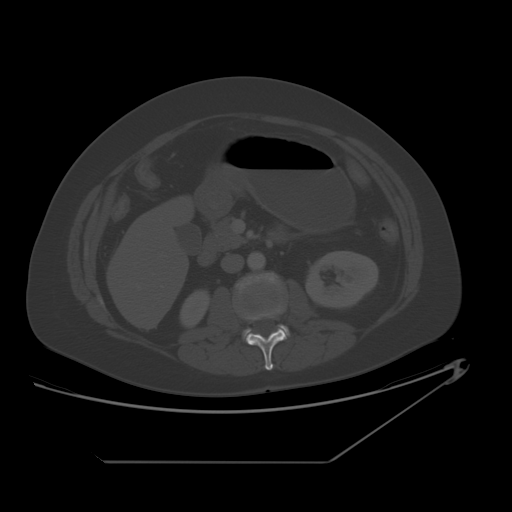
[im 63/86  soft-tissue]
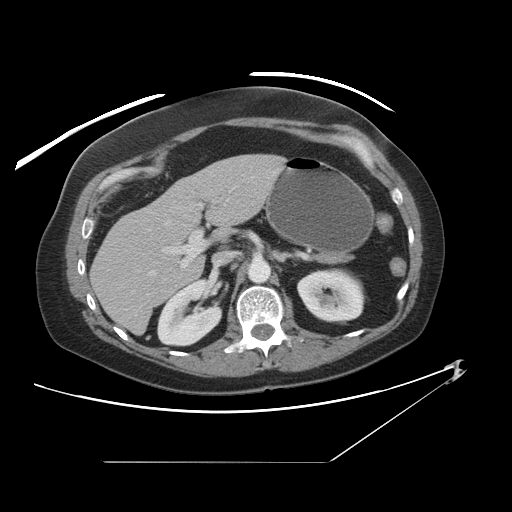
[im 68/86  soft-tissue]
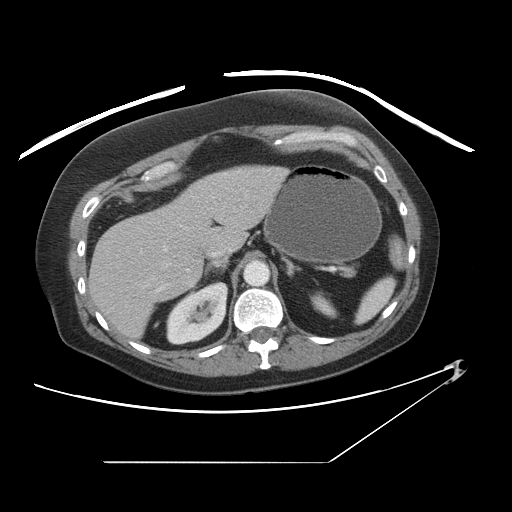
[im 72/86  soft-tissue]
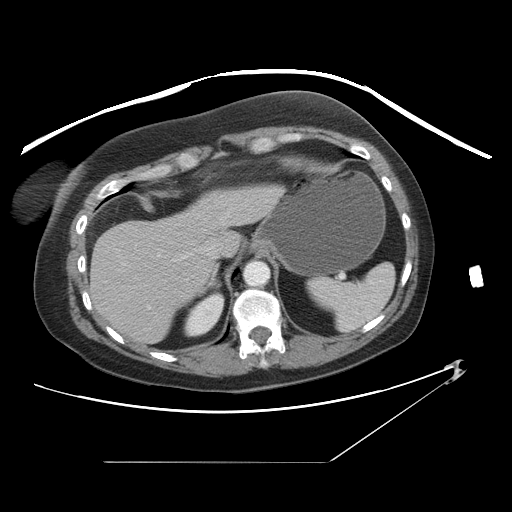
[im 81/86  soft-tissue]
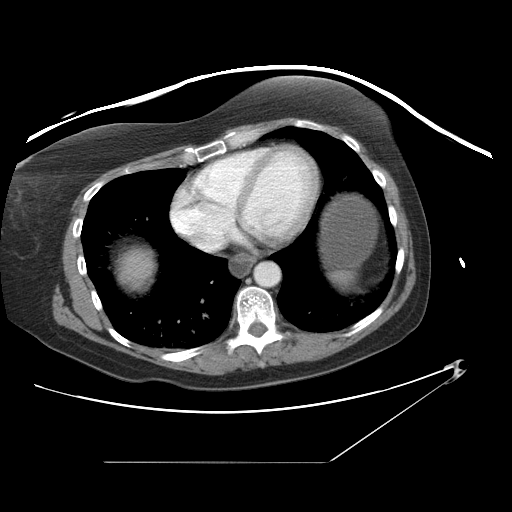

[Series 401: cor · coronal · 0.80mm/px · 3 of 91 slices shown]
[im 23/91  soft-tissue]
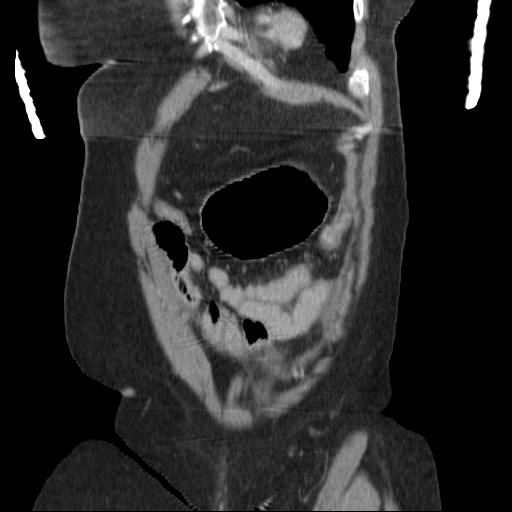
[im 46/91  soft-tissue]
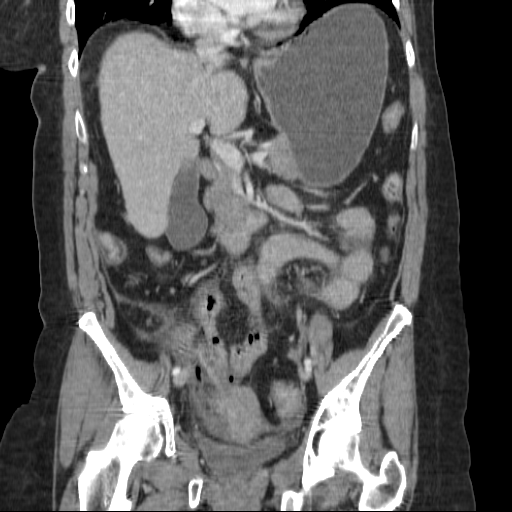
[im 68/91  soft-tissue]
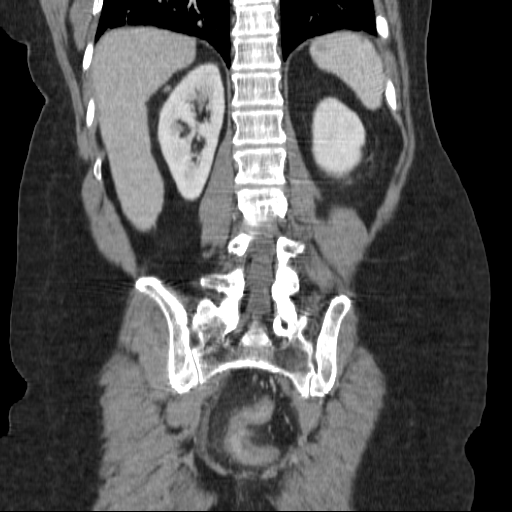

[16 of 46 positions shown; findings below may reference images not displayed]

FINDINGS: Dependent changes are noted within both lung bases.

The adrenal glands are normal.

The pancreas is normal.

The right kidney is normal.

There is a hypodense structure within the left kidney which is
unchanged and likely represents a simple cyst.

The liver is negative.

The gallbladder is normal.

There is no biliary ductal dilatation.

There are no enlarged upper abdominal lymph nodes.

There is mesenteric edema and free fluid within the upper abdomen.

There is mild wall thickening and enhancement involving the entire
colon.

There are multiple tiny foci of gas identified within the right
iliac fossa.

No discrete drainable and abscess identified.
IMPRESSION: 1.  Edema and fluid does throughout the upper abdomen is an
abnormal finding.  In the setting of recent colonoscopy with acute
abdominal pain findings are concerning for peritonitis secondary to
bowel perforation. Tiny foci of gas within the right iliac fossa
supports the diagnosis of bowel perforation secondary to a
colonoscopy.

CT PELVIS
FINDINGS: The site of perforation is felt to be at the
rectosigmoid junction, image 67 of the axial series.  Here, there
is a foci of extraluminal gas directly adjacent to the colon.

There is some of free fluid and mesenteric edema within the pelvis.

The urinary bladder is negative.

The uterus and adnexal structures are unremarkable.

There are no pathologically enlarged pelvic or inguinal lymph
nodes.
IMPRESSION: 1.  Suspect perforation of the rectosigmoid colon.

Critical test results telephoned to [REDACTED] at the time of
interpretation on [DATE] p.m. at 03/05/2009 .

## 2011-03-24 ENCOUNTER — Ambulatory Visit (INDEPENDENT_AMBULATORY_CARE_PROVIDER_SITE_OTHER): Payer: PRIVATE HEALTH INSURANCE | Admitting: Surgery

## 2011-03-24 ENCOUNTER — Encounter (INDEPENDENT_AMBULATORY_CARE_PROVIDER_SITE_OTHER): Payer: Self-pay | Admitting: Surgery

## 2011-03-24 VITALS — BP 124/86 | HR 64 | Temp 97.1°F | Resp 16 | Ht 64.0 in | Wt 159.6 lb

## 2011-03-24 DIAGNOSIS — F17200 Nicotine dependence, unspecified, uncomplicated: Secondary | ICD-10-CM

## 2011-03-24 DIAGNOSIS — K432 Incisional hernia without obstruction or gangrene: Secondary | ICD-10-CM

## 2011-03-24 NOTE — Progress Notes (Signed)
Subjective:     Patient ID: Maria Oliver, female   DOB: [birth year], 50 y.o.   MRN: [id number]  HPI  Patient Care Team: Collene Leyden as PCP - General (Family Medicine)  This patient is a 50 y.o.female who presents today for surgical evaluation.   Reason for visit: Incisional hernia. Status post numerous surgeries.  The patient is a smoking female who required emergency colectomy and ostomy for a perforation after polypectomy during colonoscopy in Sept 2010. She gradually recovered. She underwent colostomy takedown [id number]. This was complicated by a wound infection. She also had a delayed anastomotic leak that required perutaneous drainage and IV nutrition. Eventually it healed by Apr 2011. It was noted during the ostomy takedown she had a incisional hernia which was primarily repaired given the fact it was a contaminated case.  The patient notes that she has had incisional swelling since the surgery & has has worsened over the past year. She was diagnosed with a hernia and came to me for evaluation. She has a bowel movement every day. She claims she can walk 20 minutes without much problem. Some limited mobility secondary to left knee pain & arthritis.  Past Medical History  Diagnosis Date  . Arthritis   . COPD (chronic obstructive pulmonary disease)   . Hypertension   . Generalized headaches   . Leg swelling   . Abdominal pain     Past Surgical History  Procedure Date  . Hernia repair 2010  . Colon surgery 2010    per patient "colon take down"    History   Social History  . Marital Status: Widowed    Spouse Name: N/A    Number of Children: N/A  . Years of Education: N/A   Occupational History  . Not on file.   Social History Main Topics  . Smoking status: Current Everyday Smoker -- 1.0 packs/day  . Smokeless tobacco: Never Used  . Alcohol Use: No  . Drug Use: No  . Sexually Active: Not on file   Other Topics Concern  . Not on file   Social History Narrative    . No narrative on file    Family History  Problem Relation Age of Onset  . Heart disease Mother 88  . Alzheimer's disease Father 23    Current outpatient prescriptions:hydrochlorothiazide (HYDRODIURIL) 25 MG tablet, Take 25 mg by mouth daily.  , Disp: , Rfl: ;  Multiple Vitamin (MULTIVITAMIN) tablet, Take 1 tablet by mouth daily.  , Disp: , Rfl:   Allergies  Allergen Reactions  . Aspirin     REACTION: GI upset  . Codeine     REACTION: GI upset  . Penicillins     REACTION: rash       Review of Systems  Constitutional: Negative for fever, chills, diaphoresis, appetite change and fatigue.  HENT: Negative for ear pain, sore throat, trouble swallowing, neck pain and ear discharge.   Eyes: Negative for photophobia, discharge and visual disturbance.  Respiratory: Negative for cough, choking, chest tightness and shortness of breath.   Cardiovascular: Negative for chest pain and palpitations.  Gastrointestinal: Negative for nausea, vomiting, abdominal pain, diarrhea, constipation, blood in stool, abdominal distention, anal bleeding and rectal pain.  Genitourinary: Negative for dysuria, frequency and difficulty urinating.  Musculoskeletal: Positive for arthralgias. Negative for myalgias and gait problem.  Skin: Negative for color change, pallor, rash and wound.  Neurological: Negative for dizziness, speech difficulty, weakness and numbness.  Hematological: Negative for adenopathy.  Psychiatric/Behavioral: Negative  for confusion and agitation. The patient is not nervous/anxious.        Objective:   Physical Exam  Constitutional: She is oriented to person, place, and time. She appears well-developed and well-nourished. No distress.  HENT:  Head: Normocephalic.  Mouth/Throat: Oropharynx is clear and moist. No oropharyngeal exudate.  Eyes: Conjunctivae and EOM are normal. Pupils are equal, round, and reactive to light. No scleral icterus.  Neck: Normal range of motion. Neck supple.  No tracheal deviation present.  Cardiovascular: Normal rate, regular rhythm and intact distal pulses.   Pulmonary/Chest: Effort normal and breath sounds normal. No respiratory distress. She exhibits no tenderness.  Abdominal: Soft. She exhibits no distension and no mass. There is no tenderness. There is no rebound and no guarding. Hernia confirmed negative in the right inguinal area and confirmed negative in the left inguinal area.       10x10cm swelling RLQ reducing to midline c/w VWH.  Genitourinary: No vaginal discharge found.  Musculoskeletal: Normal range of motion. She exhibits no tenderness.  Lymphadenopathy:    She has no cervical adenopathy.       Right: No inguinal adenopathy present.       Left: No inguinal adenopathy present.  Neurological: She is alert and oriented to person, place, and time. No cranial nerve deficit. She exhibits normal muscle tone. Coordination normal.  Skin: Skin is warm and dry. No rash noted. She is not diaphoretic. No erythema. No pallor.  Psychiatric: She has a normal mood and affect. Her behavior is normal. Judgment and thought content normal.       Assessment:     Recurrent incisional hernia in a smoker who's had numerous complications from surgeries    Plan:     I had a long conversation with the patient. Certainly her hernia is large enough and recurrent that it would benefit from repair. She's not severely symptomatic from it.  However, I stated that I need her to stop smoking before I would take her to surgery to fix this. I noted her numerous problems with leak wound infection and recurrence. I noted that she is at high risk for hernia recurrence without stopping smoking.  She did not like this. She stated "Why have I paid to $10 to see a surgeon if you're not going to operate on me? "  She also challenged: "How would you know I even stopped smoking?"   I noted that I would check urine for nicotine to document that she really had stop smoking. I  gave her the phone number of [place]'s smoking cessation support system to help her through that period.  She noted she been smoking since a teenager. She says that when she was on patches in the hospital she still wanted to smoke. I stressed to her that smoking increases her risk for a lot of problems such as recurrence, stroke, heart attack, and numerous other problems.  While there is risk of incarceration and strangulation in her lifetime, I think the risk of complications of the surgery are higher. She wanted to get a second opinion. I discussed the case with my partners. (Drs. Rosenbower, Andrey Campanile, [place], [place]). All agree she should stop smoking.  The anatomy & physiology of the abdominal wall was discussed.  The pathophysiology of hernias was discussed.  Natural history risks without surgery of enlargement, pain, incarceration & strangulation was discussed.   Contributors to complications such as smoking, obesity, diabetes, prior surgery, etc were discussed.  I feel the risks  of no intervention will lead to serious problems that outweigh the operative risks; therefore, I recommended surgery to reduce and repair the hernia.  I explained laparoscopic techniques with possible need for an open approach.  I noted the probable use of mesh to patch and/or buttress hernia repair  Risks such as bleeding, infection, abscess, need for further treatment, heart attack, death, and other risks were discussed.  Goals of post-operative recovery were discussed as well.  Possibility that this will not correct all symptoms was explained.  I stressed the importance of low-impact activity, aggressive pain control, avoiding constipation, & not pushing through pain to minimize risk of post-operative chronic pain or injury. Possibility of reherniation was discussed.  We will work to minimize complications.   An educational handout further explaining the pathology & treatment options was given as well.   I think it is  reasonable start with a laparoscopic approach to minimize wound risks.   I offered her to to get a second opinion at any another institution. However, I suspect they will stay the same thing.  I gave her my card. I told her if she stop smoking, I will be happy to help take care of this.

## 2011-03-24 NOTE — Patient Instructions (Signed)
We strongly recommend that you stop smoking.   Smoking increases the risk of surgery including infection in the form of an open wound, pus formation, abscess, hernia at an incision on the abdomen, etc.  You have an increased risk of other MAJOR complications such as stroke, heart attack, forming clots in the leg and/or lungs, and death.    While it can be one of the most difficult things to do, the [place] community has programs to help you stop.  Consider talking with your primary care physician about options.  Also, Smoking Cessation classes are available through the [facility]. Call [phone] or check the Classes and Support Groups section of the Web site such as: http://www.hanson.biz/.cfm?id=1235  Hernia A hernia occurs when an internal organ pushes out through a weak spot in the belly (abdominal) wall. Hernias most commonly occur in the groin and around the navel. Hernias also can occur through by cut (incision) made by the surgeon after an abdominal operation. Hernias often can be pushed back into place (reduced). Most hernias tend to get worse over time. Problems occur when abdominal contents get stuck in the opening (incarcerated hernia). The blood supply becomes blocked or impaired (strangulated hernia). Because of these risks, you may require surgery to repair the hernia. CAUSES  Heavy lifting.   Prolonged coughing.   Straining to move your bowels.   Hernias can also occur through a cut (incision) by a surgeon after an abdominal operation.  HOME CARE INSTRUCTIONS  Bed rest is not required. You may continue your normal activities. Avoid heavy lifting (more than 10 pounds) or straining. Cough gently. If you are a smoker it is best to stop. Even the best hernia repair can break down with the continual strain of coughing. Even if you do not have your hernia repaired, a cough will continue to aggravate the problem.   Do not wear anything tight over your  hernia. Do not try to keep it in with an outside bandage or truss. These can damage abdominal contents if they are trapped within the hernia sac.   Eat a normal diet. Avoid constipation. Straining over long periods of time will increase hernia size and encourage breakdown of repairs. If you cannot do this with diet alone, stool softeners may be used.  SEEK IMMEDIATE MEDICAL CARE IF: You have problems (symptoms) of a trapped (incarcerated) hernia:  You develop an oral temperature above 102, or as your caregiver suggests.   You develop increasing abdominal pain.   You feel sick to your stomach (nausea) and vomiting.   The hernia is stuck outside the abdomen, looks discolored, feels hard, or is tender.   You have any changes in your bowel habits or in the hernia that is unusual for you.   You have increased pain or swelling around the hernia.   You cannot push the hernia back in place by applying gentle pressure while lying down.  MAKE SURE YOU:   Understand these instructions.   Will watch your condition.   Will get help right away if you are not doing well or get worse.  Document Released: 05/31/2005 Document Re-Released: 03/28/2009 [facility] Patient Information 2011 [place], [place].

## 2011-03-28 ENCOUNTER — Emergency Department (HOSPITAL_COMMUNITY)
Admission: EM | Admit: 2011-03-28 | Discharge: 2011-03-29 | Disposition: A | Discharge status: Home / Self Care | Payer: Self-pay | Attending: Emergency Medicine | Admitting: Emergency Medicine

## 2011-03-28 ENCOUNTER — Emergency Department (HOSPITAL_COMMUNITY): Payer: Self-pay

## 2011-03-28 DIAGNOSIS — I10 Essential (primary) hypertension: Secondary | ICD-10-CM | POA: Insufficient documentation

## 2011-03-28 DIAGNOSIS — R10819 Abdominal tenderness, unspecified site: Secondary | ICD-10-CM | POA: Insufficient documentation

## 2011-03-28 DIAGNOSIS — Z79899 Other long term (current) drug therapy: Secondary | ICD-10-CM | POA: Insufficient documentation

## 2011-03-28 DIAGNOSIS — R1032 Left lower quadrant pain: Secondary | ICD-10-CM | POA: Insufficient documentation

## 2011-03-28 DIAGNOSIS — R300 Dysuria: Secondary | ICD-10-CM | POA: Insufficient documentation

## 2011-03-28 DIAGNOSIS — R42 Dizziness and giddiness: Secondary | ICD-10-CM | POA: Insufficient documentation

## 2011-03-28 DIAGNOSIS — R350 Frequency of micturition: Secondary | ICD-10-CM | POA: Insufficient documentation

## 2011-03-28 LAB — BASIC METABOLIC PANEL
CO2: 26 mEq/L (ref 19–32)
GFR calc non Af Amer: >90 mL/min (ref >90)
Glucose, Bld: 90 mg/dL (ref 70–99)
Potassium: 3.7 mEq/L (ref 3.5–5.1)
Sodium: 137 mEq/L (ref 135–145)

## 2011-03-28 LAB — DIFFERENTIAL
Basophils Absolute: 0 10*3/uL (ref 0.0–0.1)
Basophils Relative: 0 % (ref 0–1)
Neutro Abs: 6.5 10*3/uL (ref 1.7–7.7)
Neutrophils Relative %: 53 % (ref 43–77)

## 2011-03-28 LAB — URINALYSIS, ROUTINE W REFLEX MICROSCOPIC
Leukocytes, UA: NEGATIVE
Nitrite: NEGATIVE
Specific Gravity, Urine: 1.009 (ref 1.005–1.030)
Urobilinogen, UA: 0.2 mg/dL (ref 0.0–1.0)

## 2011-03-28 LAB — CBC
Hemoglobin: 15.1 g/dL — ABNORMAL HIGH (ref 12.0–15.0)
RBC: 5.16 MIL/uL — ABNORMAL HIGH (ref 3.87–5.11)

## 2011-03-29 MED ORDER — IOHEXOL 300 MG/ML  SOLN
80.0000 mL | Freq: Once | INTRAMUSCULAR | Status: AC | PRN
  Administered 2011-03-29: 80 mL via INTRAVENOUS

## 2011-04-20 ENCOUNTER — Encounter (HOSPITAL_COMMUNITY): Payer: Self-pay | Admitting: *Deleted

## 2011-04-20 ENCOUNTER — Emergency Department (HOSPITAL_COMMUNITY)
Admission: EM | Admit: 2011-04-20 | Discharge: 2011-04-20 | Disposition: A | Discharge status: Home / Self Care | Payer: Self-pay | Source: Home / Self Care | Attending: Family Medicine | Admitting: Family Medicine

## 2011-04-20 DIAGNOSIS — J329 Chronic sinusitis, unspecified: Secondary | ICD-10-CM

## 2011-04-20 DIAGNOSIS — H8309 Labyrinthitis, unspecified ear: Secondary | ICD-10-CM

## 2011-04-20 MED ORDER — MECLIZINE HCL 25 MG PO TABS: 25.0000 mg | ORAL_TABLET | Freq: Four times a day (QID) | ORAL | Status: AC

## 2011-04-20 MED ORDER — AZITHROMYCIN 250 MG PO TABS: 250.0000 mg | ORAL_TABLET | Freq: Every day | ORAL | Status: AC

## 2011-04-20 NOTE — ED Provider Notes (Signed)
History     CSN: [id number] Arrival date & time: 04/20/2011  3:06 PM   First MD Initiated Contact with Patient 04/20/11 1554      Chief Complaint  Patient presents with  . Headache    1 1/2 week of sinus pressure, headache, dizziness    (Consider location/radiation/quality/duration/timing/severity/associated sxs/prior treatment) Patient is a 50 y.o. female presenting with headaches.  Headache The primary symptoms include headaches, dizziness and nausea. Primary symptoms do not include altered mental status, visual change, focal weakness, loss of sensation, fever or vomiting. The symptoms are unchanged. Context: change of position.  The headache is not associated with photophobia, visual change, neck stiffness or weakness.  Dizziness also occurs with nausea. Dizziness does not occur with vomiting or weakness.  Additional symptoms do not include neck stiffness, weakness, pain, photophobia, hearing loss or vertigo. Medical issues do not include seizures, alcohol use, diabetes or recent surgery.    Past Medical History  Diagnosis Date  . Arthritis   . COPD (chronic obstructive pulmonary disease)   . Hypertension   . Generalized headaches   . Leg swelling   . Abdominal pain     Past Surgical History  Procedure Date  . Hernia repair 2010  . Colon surgery 2010    per patient "colon take down"    Family History  Problem Relation Age of Onset  . Heart disease Mother 109  . Heart failure Mother   . Asthma Mother   . Alzheimer's disease Father 91    History  Substance Use Topics  . Smoking status: Current Everyday Smoker -- 1.0 packs/day for 25 years  . Smokeless tobacco: Never Used  . Alcohol Use: No    OB History    Grav Para Term Preterm Abortions TAB SAB Ect Mult Living                  Review of Systems  Constitutional: Negative.  Negative for fever.  HENT: Negative for hearing loss, ear pain and neck stiffness.   Eyes: Negative.  Negative for photophobia.    Respiratory: Negative.   Cardiovascular: Negative.   Gastrointestinal: Positive for nausea. Negative for vomiting and diarrhea.  Genitourinary: Negative.   Neurological: Positive for dizziness and headaches. Negative for vertigo, focal weakness, weakness, light-headedness and numbness.  Psychiatric/Behavioral: Negative for altered mental status.    Allergies  Aspirin; Codeine; and Penicillins  Home Medications   Current Outpatient Rx  Name Route Sig Dispense Refill  . HYDROCHLOROTHIAZIDE 25 MG PO TABS Oral Take 25 mg by mouth daily.      Marland Kitchen ONE-DAILY MULTI VITAMINS PO TABS Oral Take 1 tablet by mouth daily.        BP 142/89  Pulse 73  Temp(Src) 98.3 F (36.8 C) (Oral)  Resp 18  SpO2 98%  Physical Exam  Constitutional: She is oriented to person, place, and time. She appears well-developed and well-nourished. No distress.  HENT:  Head: Normocephalic and atraumatic.  Right Ear: External ear normal.  Left Ear: External ear normal.  Nose: Nose normal.  Mouth/Throat: Oropharynx is clear and moist.       Maxillary and frontal tenderness  Eyes: Conjunctivae and EOM are normal. Pupils are equal, round, and reactive to light. Right eye exhibits no discharge. Left eye exhibits no discharge.  Neck: Normal range of motion. Neck supple.  Cardiovascular: Normal rate and regular rhythm.   Pulmonary/Chest: Effort normal and breath sounds normal.  Neurological: She is alert and oriented to person, place,  and time. No cranial nerve deficit. Coordination normal.  Skin: Skin is warm and dry.  Psychiatric: She has a normal mood and affect.    ED Course  Procedures (including critical care time)  Labs Reviewed - No data to display No results found.   No diagnosis found.    MDM          Randa Spike, MD 04/20/11 [phone]

## 2011-06-21 IMAGING — CR DG CHEST 2V
2 series · 2 of 2 positions shown · non-contrast
Comparison: 04/06/2004

CLINICAL DATA: Preop colon surgery.

CHEST - 2 VIEW

[view not recorded (1 of 2)]
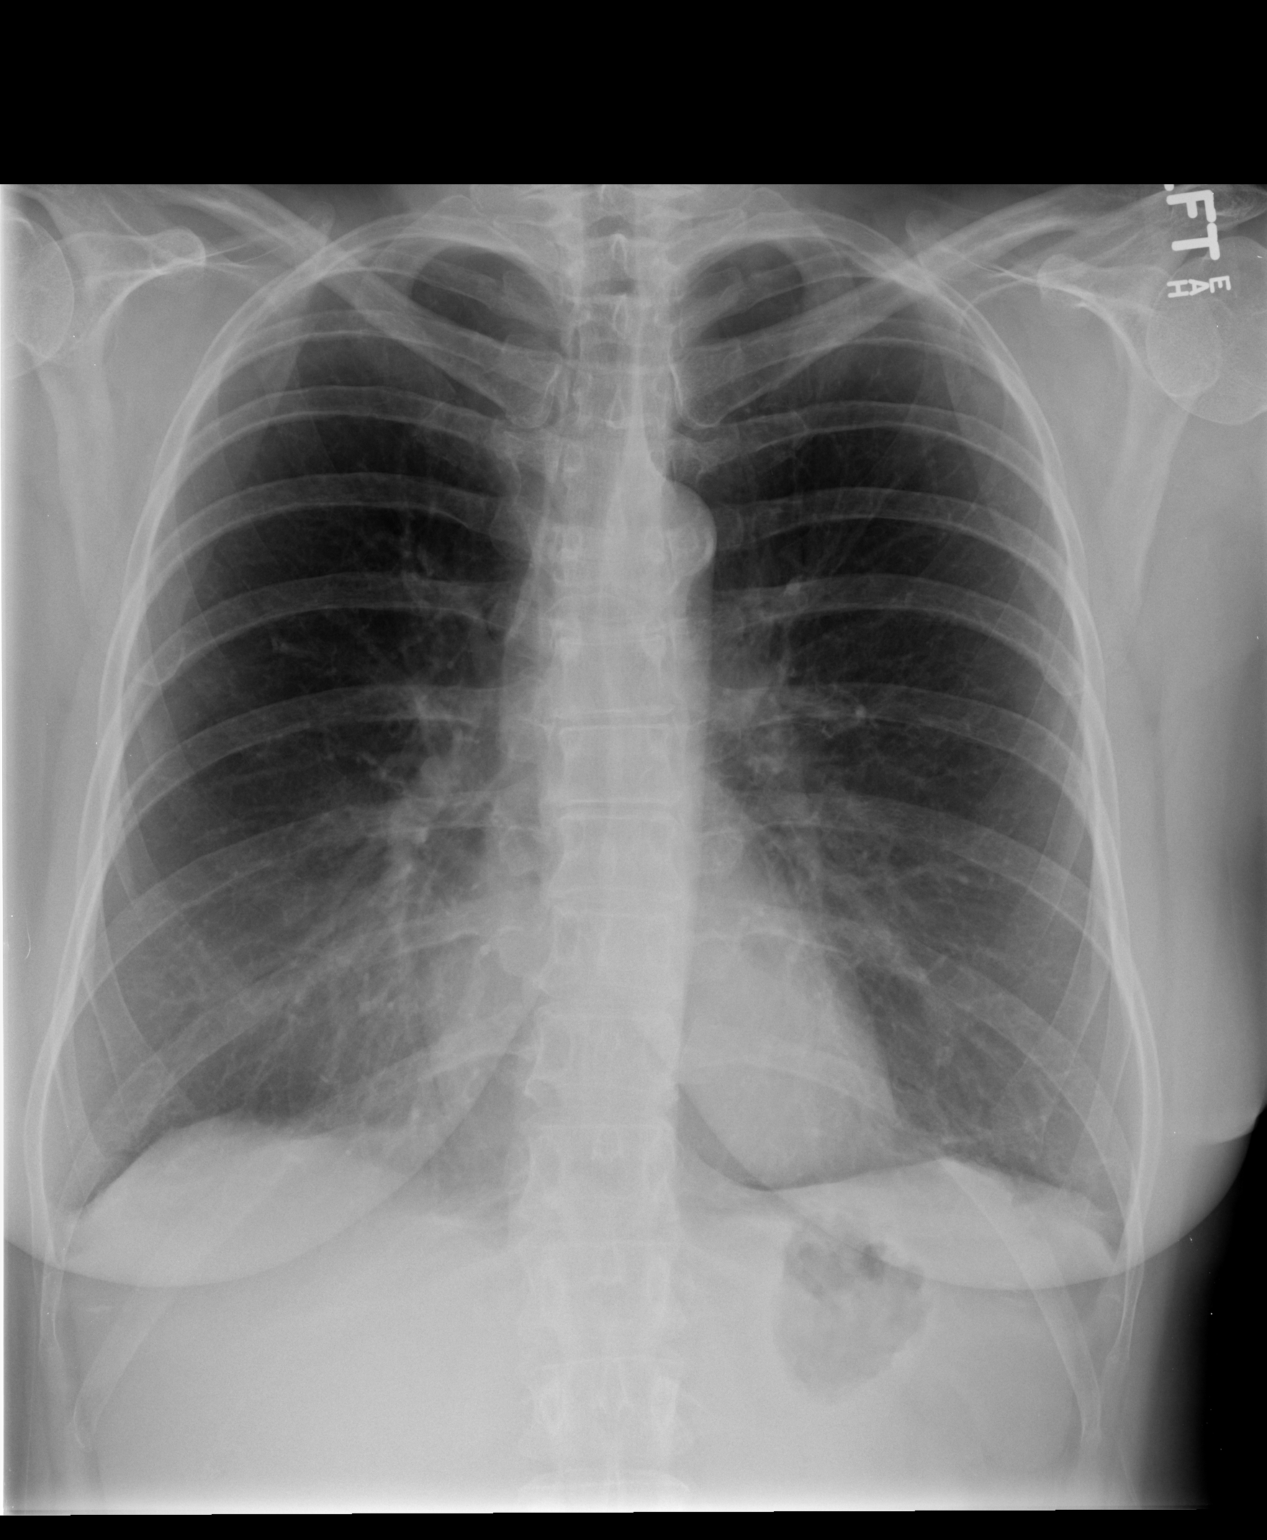

[view not recorded (2 of 2)]
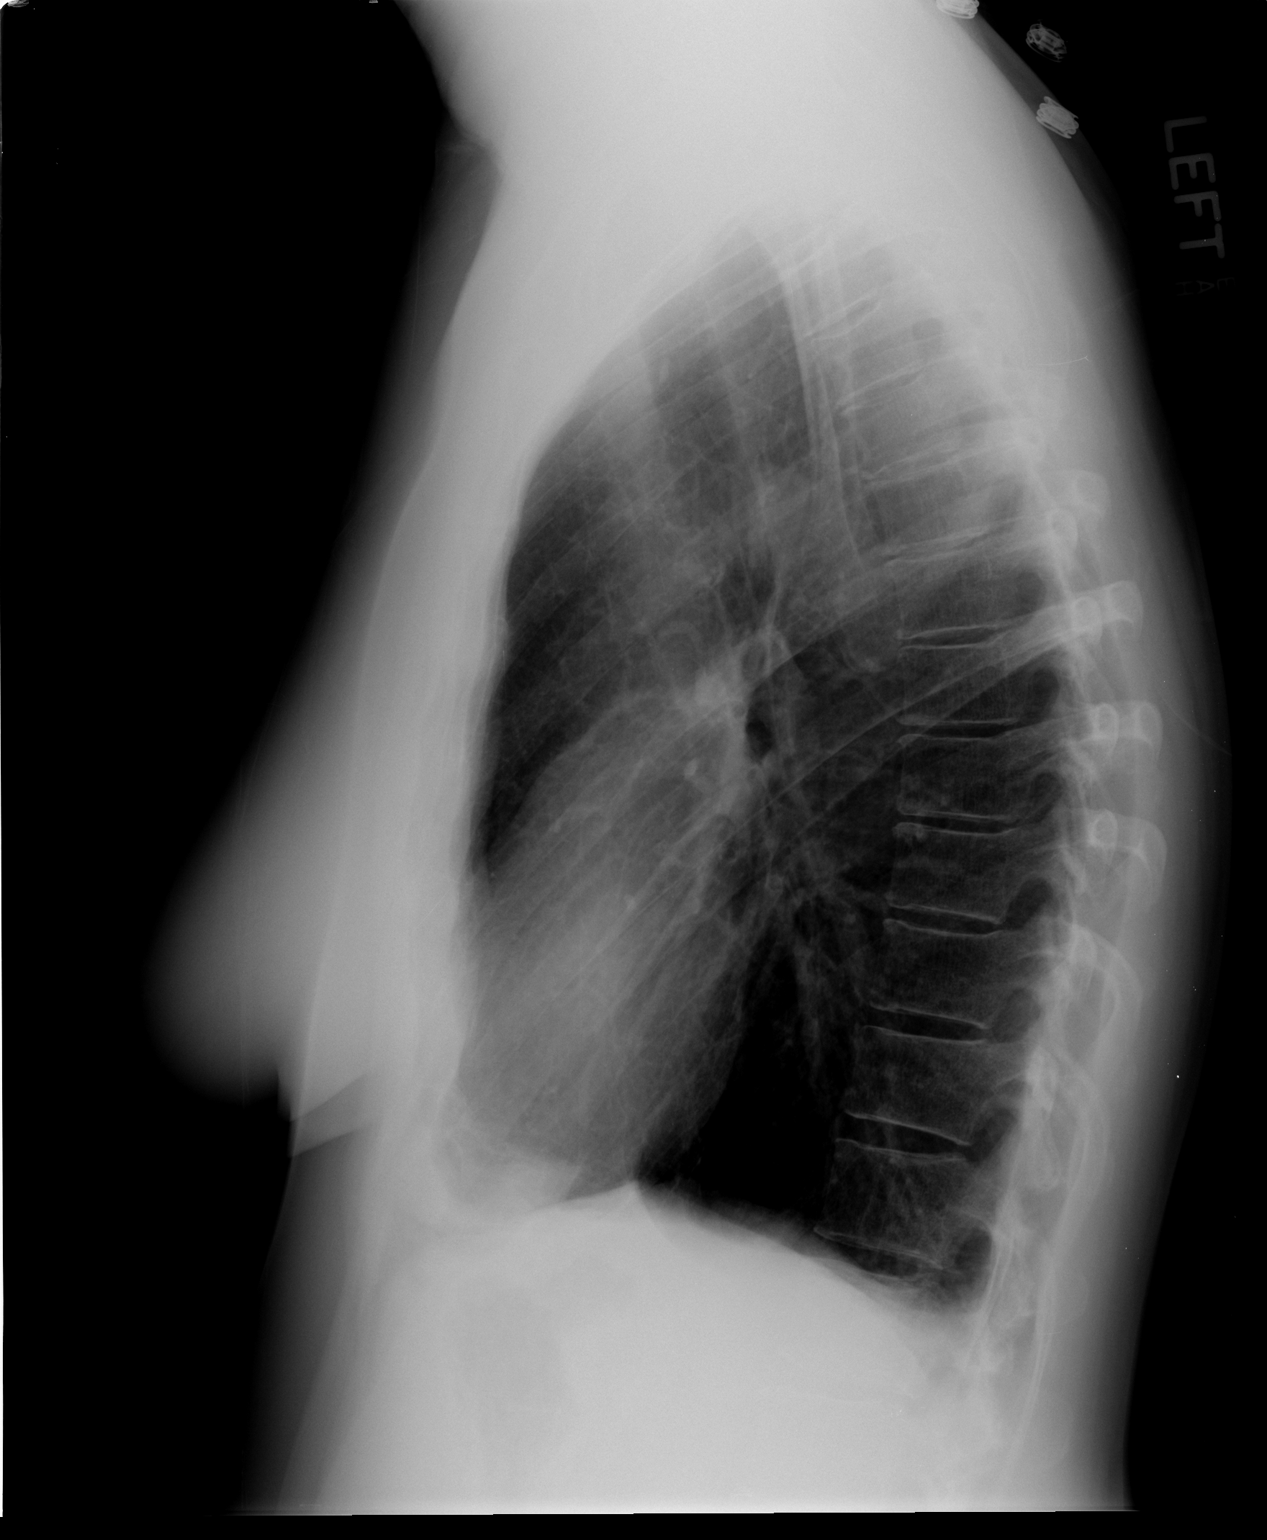

[2 of 2 positions shown; findings below may reference images not displayed]

FINDINGS: Suspect mild COPD with hyperinflation.  Heart is normal
size.  Lungs are clear.  No effusions or acute bony abnormality.
IMPRESSION: COPD.  No acute findings.

## 2011-09-21 ENCOUNTER — Encounter (HOSPITAL_COMMUNITY): Payer: Self-pay | Admitting: Emergency Medicine

## 2011-09-21 ENCOUNTER — Emergency Department (HOSPITAL_COMMUNITY)
Admission: EM | Admit: 2011-09-21 | Discharge: 2011-09-22 | Disposition: A | Discharge status: Home / Self Care | Payer: Self-pay | Attending: Emergency Medicine | Admitting: Emergency Medicine

## 2011-09-21 DIAGNOSIS — I10 Essential (primary) hypertension: Secondary | ICD-10-CM | POA: Insufficient documentation

## 2011-09-21 DIAGNOSIS — F17200 Nicotine dependence, unspecified, uncomplicated: Secondary | ICD-10-CM | POA: Insufficient documentation

## 2011-09-21 DIAGNOSIS — R109 Unspecified abdominal pain: Secondary | ICD-10-CM | POA: Insufficient documentation

## 2011-09-21 DIAGNOSIS — K439 Ventral hernia without obstruction or gangrene: Secondary | ICD-10-CM | POA: Insufficient documentation

## 2011-09-21 DIAGNOSIS — M25559 Pain in unspecified hip: Secondary | ICD-10-CM | POA: Insufficient documentation

## 2011-09-21 DIAGNOSIS — Z9889 Other specified postprocedural states: Secondary | ICD-10-CM | POA: Insufficient documentation

## 2011-09-21 DIAGNOSIS — R10813 Right lower quadrant abdominal tenderness: Secondary | ICD-10-CM | POA: Insufficient documentation

## 2011-09-21 DIAGNOSIS — M25551 Pain in right hip: Secondary | ICD-10-CM

## 2011-09-21 LAB — CBC
MCV: 86.5 fL (ref 78.0–100.0)
Platelets: 342 10*3/uL (ref 150–400)
RBC: 4.83 MIL/uL (ref 3.87–5.11)
WBC: 11.3 10*3/uL — ABNORMAL HIGH (ref 4.0–10.5)

## 2011-09-21 LAB — DIFFERENTIAL
Basophils Absolute: 0.1 10*3/uL (ref 0.0–0.1)
Eosinophils Relative: 6 % — ABNORMAL HIGH (ref 0–5)
Lymphocytes Relative: 38 % (ref 12–46)
Lymphs Abs: 4.3 10*3/uL — ABNORMAL HIGH (ref 0.7–4.0)
Neutro Abs: 5.6 10*3/uL (ref 1.7–7.7)

## 2011-09-21 LAB — BASIC METABOLIC PANEL
CO2: 25 mEq/L (ref 19–32)
Chloride: 105 mEq/L (ref 96–112)
Glucose, Bld: 115 mg/dL — ABNORMAL HIGH (ref 70–99)
Potassium: 3.9 mEq/L (ref 3.5–5.1)
Sodium: 141 mEq/L (ref 135–145)

## 2011-09-21 NOTE — ED Notes (Signed)
PT. REPORTS RIGHT HIP , RIGHT LOWER BACKA ND RIGHT THIGH PAIN FOR 3 DAYS , DENIES INJURY OR FALL , WORSE WHEN WALKING , STATES HISTORY OF HERNIA .

## 2011-09-22 ENCOUNTER — Emergency Department (HOSPITAL_COMMUNITY): Payer: Self-pay

## 2011-09-22 LAB — URINALYSIS, ROUTINE W REFLEX MICROSCOPIC
Glucose, UA: NEGATIVE mg/dL
Hgb urine dipstick: NEGATIVE
Specific Gravity, Urine: 1.007 (ref 1.005–1.030)
Urobilinogen, UA: 0.2 mg/dL (ref 0.0–1.0)

## 2011-09-22 LAB — URINE MICROSCOPIC-ADD ON

## 2011-09-22 MED ORDER — IOHEXOL 300 MG/ML  SOLN
80.0000 mL | Freq: Once | INTRAMUSCULAR | Status: AC | PRN
  Administered 2011-09-22: 80 mL via INTRAVENOUS

## 2011-09-22 MED ORDER — ONDANSETRON HCL 4 MG/2ML IJ SOLN
4.0000 mg | Freq: Once | INTRAMUSCULAR | Status: AC
  Administered 2011-09-22: 4 mg via INTRAVENOUS
  Filled 2011-09-22: qty 2

## 2011-09-22 MED ORDER — SODIUM CHLORIDE 0.9 % IV BOLUS (SEPSIS)
1000.0000 mL | Freq: Once | INTRAVENOUS | Status: AC
  Administered 2011-09-22: 1000 mL via INTRAVENOUS

## 2011-09-22 MED ORDER — ONDANSETRON HCL 4 MG/2ML IJ SOLN
4.0000 mg | Freq: Once | INTRAMUSCULAR | Status: AC
  Administered 2011-09-22: 4 mg via INTRAVENOUS

## 2011-09-22 MED ORDER — IOHEXOL 300 MG/ML  SOLN
20.0000 mL | INTRAMUSCULAR | Status: AC
  Administered 2011-09-22: 20 mL via ORAL

## 2011-09-22 MED ORDER — HYDROCODONE-ACETAMINOPHEN 5-500 MG PO TABS: 1.0000 | ORAL_TABLET | Freq: Four times a day (QID) | ORAL | Status: AC | PRN

## 2011-09-22 MED ORDER — ONDANSETRON HCL 4 MG/2ML IJ SOLN
INTRAMUSCULAR | Status: AC
  Administered 2011-09-22: 4 mg via INTRAVENOUS
  Filled 2011-09-22: qty 2

## 2011-09-22 MED ORDER — HYDROCHLOROTHIAZIDE 25 MG PO TABS: 25.0000 mg | ORAL_TABLET | Freq: Every day | ORAL | Status: DC

## 2011-09-22 MED ORDER — HYDROMORPHONE HCL PF 1 MG/ML IJ SOLN
1.0000 mg | Freq: Once | INTRAMUSCULAR | Status: AC
  Administered 2011-09-22: 1 mg via INTRAVENOUS
  Filled 2011-09-22: qty 1

## 2011-09-22 NOTE — ED Provider Notes (Signed)
  7:30 AM Pt seen and examined by me in CDU. Pt placed in CDU awaiting CT abd/pelvis for evaluation of her hernia and abdominal pain. Pt in NAD, VS normal. Abdomen soft, diffusely tender. Also complaining of right hip pain, worsenining over last 3 days, x-ray negative. Pt states "I hurt all over."   Dg Hip Complete Right  09/22/2011  *RADIOLOGY REPORT*  Clinical Data: Right hip pain.  RIGHT HIP - COMPLETE 2+ VIEW  Comparison: None.  Findings: There is no evidence of fracture or dislocation.  Both femoral heads are seated normally within their respective acetabula.  The proximal right femur appears intact.  No significant degenerative change is appreciated.  The sacroiliac joints are unremarkable in appearance.  The visualized bowel gas pattern is grossly unremarkable in appearance.  IMPRESSION: No evidence of fracture or dislocation.  Original Report Authenticated By: Tonia Ghent, M.D.   Ct Abdomen Pelvis W Contrast  09/22/2011  *RADIOLOGY REPORT*  Clinical Data: Right lower quadrant pain.  Right flank pain. Nausea and vomiting.  Previous colon resection for diverticulitis.  CT ABDOMEN AND PELVIS WITH CONTRAST  Technique:  Multidetector CT imaging of the abdomen and pelvis was performed following the standard protocol during bolus administration of intravenous contrast.  Contrast: 80mL OMNIPAQUE IOHEXOL 300 MG/ML  SOLN  Comparison: 03/29/2011  Findings: A large hypogastric ventral abdominal wall hernia is again seen containing both colon and small bowel.  There is no evidence of bowel obstruction or ischemia.  The appendix is not well visualized, however there is no evidence of inflammatory process in the region of the cecum.  No evidence of bowel wall thickening or dilatation.  No other signs of inflammatory process or abnormal fluid collections within the abdomen or pelvis.  The liver, gallbladder, pancreas, adrenal glands, and kidneys are normal appearance. No evidence of hydronephrosis or ureteral  calculi.  Several small low attenuation splenic lesions remain stable, consistent with a benign etiology.  Uterus and adnexa are unremarkable in appearance.  No other soft tissue masses or lymphadenopathy identified within the abdomen or pelvis.  IMPRESSION:  1.  No acute findings. 2.  Stable large hypogastric ventral hernia.  No evidence of bowel obstruction or ischemia.  Original Report Authenticated By: Danae Orleans, M.D.    8:55 AM CT abdomen pelvis negative. Will d/chome with pain medications. Pt asking for refill of HCTZ. She is non toxic, ambulatory, VS remain normal.   Lottie Mussel, PA 09/22/11 [phone]

## 2011-09-22 NOTE — ED Provider Notes (Signed)
History     CSN: [id number]  Arrival date & time 09/21/11  1907   First MD Initiated Contact with Patient 09/22/11 0032      Chief Complaint  Patient presents with  . Hip Pain    (Consider location/radiation/quality/duration/timing/severity/associated sxs/prior treatment) HPI  Patient who is a smoking female who required emergency colectomy and ostomy for a perforation after polypectomy during colonoscopy in Sept 2010 and gradually recovered and underwent colostomy takedown Feb 2011 that was complicated by a wound infection with a incisional hernia which was primarily repaired given the fact it was a contaminated case and ongoing right abdominal hernia presents to ER complaining of a 3-4 day hx of right hip, right lower abdomen and lower back pain. Patient states that pain was gradual onset and mild and located primarily in her right hip but that over the last few days has been increasing in pain and now involves total right hip, lower back and right lower abdomen. Patient states "I am just so sore all over." Patient has taken nothing for pain prior to arrival. Patient states pain is aggravated by movement such as walking in such as touch like lying on her belly. Patient states pain is now constant. She denies known fevers, chills, chest pain, shortness breath, nausea, vomiting, diarrhea, dysuria, hematuria, blood in her stool, or increased urination. She denies vaginal discharge or pelvic pain. Patient states [facility] as her [facility] care [facility] but that she no longer meets criteria to be seen. Patient states she's not had blood pressure medication in a long time and is requesting refill.   Past Medical History  Diagnosis Date  . Arthritis   . COPD (chronic obstructive pulmonary disease)   . Hypertension   . Generalized headaches   . Leg swelling   . Abdominal pain     Past Surgical History  Procedure Date  . Hernia repair 2010  . Colon  surgery 2010    per patient "colon take down"    Family History  Problem Relation Age of Onset  . Heart disease Mother 72  . Heart failure Mother   . Asthma Mother   . Alzheimer's disease Father 63    History  Substance Use Topics  . Smoking status: Current Everyday Smoker -- 1.0 packs/day for 25 years  . Smokeless tobacco: Never Used  . Alcohol Use: No    OB History    Grav Para Term Preterm Abortions TAB SAB Ect Mult Living                  Review of Systems  All other systems reviewed and are negative.    Allergies  Aspirin; Codeine; and Penicillins  Home Medications   Current Outpatient Rx  Name Route Sig Dispense Refill  . HYDROCHLOROTHIAZIDE 25 MG PO TABS Oral Take 25 mg by mouth daily.      . IBUPROFEN 200 MG PO TABS Oral Take 400 mg by mouth every 6 (six) hours as needed. For pain    . ONE-DAILY MULTI VITAMINS PO TABS Oral Take 1 tablet by mouth daily.        BP 122/90  Pulse 87  Temp(Src) 98.7 F (37.1 C) (Oral)  Resp 18  SpO2 94%  Physical Exam  Nursing note and vitals reviewed. Constitutional: She is oriented to person, place, and time. She appears well-developed and well-nourished. No distress.  HENT:  Head: Normocephalic and atraumatic.  Eyes: Conjunctivae are normal.  Neck: Normal range of motion. Neck supple.  Cardiovascular: Normal rate, regular rhythm, normal heart sounds and intact distal pulses.  Exam reveals no gallop and no friction rub.   No murmur heard. Pulmonary/Chest: Effort normal and breath sounds normal. No respiratory distress. She has no wheezes. She has no rales. She exhibits no tenderness.  Abdominal: Soft. Bowel sounds are normal. She exhibits no distension and no mass. There is tenderness. There is no rebound and no guarding.       Moderate tenderness to palpation with guarding of right lower quadrant with right lower abdominal hernia that is soft and reducible.   Musculoskeletal: Normal range of motion. She exhibits  tenderness. She exhibits no edema.       Tenderness to palpation of the lateral right hip pain with full range of motion but no erythema, rash, or swelling. No tenderness to palpation of entire bilateral eyes or lower legs. Good femoral pulse bilaterally good pedal pulses bilaterally  5/5 strength of bilateral lower extremities. No spinal midline tenderness to palpation.  Neurological: She is alert and oriented to person, place, and time.  Skin: Skin is warm and dry. No rash noted. She is not diaphoretic. No erythema.  Psychiatric: She has a normal mood and affect.    ED Course  Procedures (including critical care time)  IV fluids, IV dilaudid, IV zofran  Labs Reviewed  URINALYSIS, ROUTINE W REFLEX MICROSCOPIC - Abnormal; Notable for the following:    Leukocytes, UA TRACE (*)    All other components within normal limits  CBC - Abnormal; Notable for the following:    WBC 11.3 (*)    All other components within normal limits  DIFFERENTIAL - Abnormal; Notable for the following:    Lymphs Abs 4.3 (*)    Eosinophils Relative 6 (*)    All other components within normal limits  BASIC METABOLIC PANEL - Abnormal; Notable for the following:    Glucose, Bld 115 (*)    All other components within normal limits  URINE MICROSCOPIC-ADD ON - Abnormal; Notable for the following:    Squamous Epithelial / LPF MANY (*)    All other components within normal limits   Dg Hip Complete Right  09/22/2011  *RADIOLOGY REPORT*  Clinical Data: Right hip pain.  RIGHT HIP - COMPLETE 2+ VIEW  Comparison: None.  Findings: There is no evidence of fracture or dislocation.  Both femoral heads are seated normally within their respective acetabula.  The proximal right femur appears intact.  No significant degenerative change is appreciated.  The sacroiliac joints are unremarkable in appearance.  The visualized bowel gas pattern is grossly unremarkable in appearance.  IMPRESSION: No evidence of fracture or dislocation.   Original Report Authenticated By: Tonia Ghent, M.D.     No diagnosis found.    MDM  CT adomen pelvis pending with dispo pending results. Sign out given to Dr. Nicanor Alcon and Lorenz Coaster PA-C. VSS.         Lenon Oms [place], [place] 09/22/11 [phone]

## 2011-09-22 NOTE — ED Provider Notes (Signed)
Medical screening examination/treatment/procedure(s) were performed by non-physician practitioner and as supervising physician I was immediately available for consultation/collaboration.  Jahmai Finelli R. Odell Fasching, MD 09/22/11 0712 

## 2011-09-22 NOTE — ED Notes (Signed)
The p[t continues to vomit 

## 2011-09-22 NOTE — ED Notes (Signed)
The pt is sleeping at present.  Unable to drink oral contrast

## 2011-09-22 NOTE — Discharge Instructions (Signed)
Your x-ray of your right hip is normal today. You pain may be related to arthritis, or a pulled muscle. Your CT of abdomen is normal as well. Make sure to continue your blood pressure medications. Tylenol for pain, take vicodin as prescribed as needed for severe pain. Follow up with primary care doctor as soon as able for further evaluation and treatment.   Abdominal Pain Abdominal pain can be caused by many things. Your caregiver decides the seriousness of your pain by an examination and possibly blood tests and X-rays. Many cases can be observed and treated at home. Most abdominal pain is not caused by a disease and will probably improve without treatment. However, in many cases, more time must pass before a clear cause of the pain can be found. Before that point, it may not be known if you need more testing, or if hospitalization or surgery is needed. HOME CARE INSTRUCTIONS   Do not take laxatives unless directed by your caregiver.   Take pain medicine only as directed by your caregiver.   Only take over-the-counter or prescription medicines for pain, discomfort, or fever as directed by your caregiver.   Try a clear liquid diet (broth, tea, or water) for as long as directed by your caregiver. Slowly move to a bland diet as tolerated.  SEEK IMMEDIATE MEDICAL CARE IF:   The pain does not go away.   You have a fever.   You keep throwing up (vomiting).   The pain is felt only in portions of the abdomen. Pain in the right side could possibly be appendicitis. In an adult, pain in the left lower portion of the abdomen could be colitis or diverticulitis.   You pass bloody or black tarry stools.  MAKE SURE YOU:   Understand these instructions.   Will watch your condition.   Will get help right away if you are not doing well or get worse.  Document Released: 03/10/2005 Document Revised: 05/20/2011 Document Reviewed: 01/17/2008 [facility] [employer] Patient Information 2012 [place], [place].

## 2011-09-22 NOTE — ED Notes (Signed)
PA at bedside.

## 2011-09-22 NOTE — ED Notes (Signed)
X-ray here to get patient; informed x-ray to come back in 10 minutes so patient can be medicated for pain first.

## 2011-09-22 NOTE — ED Notes (Signed)
Patient transported to CT 

## 2011-09-22 NOTE — ED Notes (Signed)
Pt continues to drink contrast slowly without difficulty.

## 2011-09-22 NOTE — ED Notes (Signed)
Pt awake and alert, able to drink contrast independently. Will continue to monitor closely.

## 2011-09-22 NOTE — ED Notes (Signed)
Patient complaining of right lower abdominal pain, which reaches around her side and to her flank area for the past three days.  Patient does have right abdominal hernia (has had hernia for six months). Patient describes pain as "constant" and "burning".  Patient denies history of kidney stones; denies any changes in her urine.  Patient alert and oriented x4; PERRL present.  Upon arrival to room, patient changed into gown.  Will continue to monitor.

## 2011-09-25 NOTE — ED Provider Notes (Signed)
Medical screening examination/treatment/procedure(s) were performed by non-physician practitioner and as supervising physician I was immediately available for consultation/collaboration.  Juliet Rude. Rubin Payor, MD 09/25/11 1949

## 2011-11-29 ENCOUNTER — Emergency Department (HOSPITAL_COMMUNITY)
Admission: EM | Admit: 2011-11-29 | Discharge: 2011-11-29 | Disposition: A | Discharge status: Home / Self Care | Payer: No Typology Code available for payment source | Attending: Emergency Medicine | Admitting: Emergency Medicine

## 2011-11-29 ENCOUNTER — Emergency Department (HOSPITAL_COMMUNITY): Payer: No Typology Code available for payment source

## 2011-11-29 ENCOUNTER — Encounter (HOSPITAL_COMMUNITY): Payer: Self-pay | Admitting: Emergency Medicine

## 2011-11-29 DIAGNOSIS — S335XXA Sprain of ligaments of lumbar spine, initial encounter: Secondary | ICD-10-CM | POA: Insufficient documentation

## 2011-11-29 DIAGNOSIS — M542 Cervicalgia: Secondary | ICD-10-CM | POA: Insufficient documentation

## 2011-11-29 DIAGNOSIS — I10 Essential (primary) hypertension: Secondary | ICD-10-CM | POA: Insufficient documentation

## 2011-11-29 DIAGNOSIS — S139XXA Sprain of joints and ligaments of unspecified parts of neck, initial encounter: Secondary | ICD-10-CM | POA: Insufficient documentation

## 2011-11-29 DIAGNOSIS — F17200 Nicotine dependence, unspecified, uncomplicated: Secondary | ICD-10-CM | POA: Insufficient documentation

## 2011-11-29 DIAGNOSIS — S39012A Strain of muscle, fascia and tendon of lower back, initial encounter: Secondary | ICD-10-CM

## 2011-11-29 DIAGNOSIS — S161XXA Strain of muscle, fascia and tendon at neck level, initial encounter: Secondary | ICD-10-CM

## 2011-11-29 DIAGNOSIS — J449 Chronic obstructive pulmonary disease, unspecified: Secondary | ICD-10-CM | POA: Insufficient documentation

## 2011-11-29 DIAGNOSIS — M545 Low back pain, unspecified: Secondary | ICD-10-CM | POA: Insufficient documentation

## 2011-11-29 DIAGNOSIS — M25512 Pain in left shoulder: Secondary | ICD-10-CM

## 2011-11-29 DIAGNOSIS — J4489 Other specified chronic obstructive pulmonary disease: Secondary | ICD-10-CM | POA: Insufficient documentation

## 2011-11-29 DIAGNOSIS — M25519 Pain in unspecified shoulder: Secondary | ICD-10-CM | POA: Insufficient documentation

## 2011-11-29 MED ORDER — HYDROCODONE-ACETAMINOPHEN 5-325 MG PO TABS
1.0000 | ORAL_TABLET | Freq: Once | ORAL | Status: AC
  Administered 2011-11-29: 1 via ORAL
  Filled 2011-11-29: qty 1

## 2011-11-29 MED ORDER — DIAZEPAM 5 MG PO TABS
5.0000 mg | ORAL_TABLET | Freq: Once | ORAL | Status: AC
  Administered 2011-11-29: 5 mg via ORAL
  Filled 2011-11-29: qty 1

## 2011-11-29 MED ORDER — HYDROCODONE-ACETAMINOPHEN 5-325 MG PO TABS: 1.0000 | ORAL_TABLET | ORAL | Status: AC | PRN

## 2011-11-29 MED ORDER — DIAZEPAM 5 MG PO TABS: 5.0000 mg | ORAL_TABLET | Freq: Four times a day (QID) | ORAL | Status: AC | PRN

## 2011-11-29 NOTE — ED Notes (Signed)
Rx x 2.  Pt voiced understanding to f/u with PCP and return for worsening condition.  
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
## 2011-11-29 NOTE — Discharge Instructions (Signed)
Arthralgia Your caregiver has diagnosed you as suffering from an arthralgia. Arthralgia means there is pain in a joint. This can come from many reasons including:  Bruising the joint which causes soreness (inflammation) in the joint.   Wear and tear on the joints which occur as we grow older (osteoarthritis).   Overusing the joint.   Various forms of arthritis.   Infections of the joint.  Regardless of the cause of pain in your joint, most of these different pains respond to anti-inflammatory drugs and rest. The exception to this is when a joint is infected, and these cases are treated with antibiotics, if it is a bacterial infection. HOME CARE INSTRUCTIONS   Rest the injured area for as long as directed by your caregiver. Then slowly start using the joint as directed by your caregiver and as the pain allows. Crutches as directed may be useful if the ankles, knees or hips are involved. If the knee was splinted or casted, continue use and care as directed. If an stretchy or elastic wrapping bandage has been applied today, it should be removed and re-applied every 3 to 4 hours. It should not be applied tightly, but firmly enough to keep swelling down. Watch toes and feet for swelling, bluish discoloration, coldness, numbness or excessive pain. If any of these problems (symptoms) occur, remove the ace bandage and re-apply more loosely. If these symptoms persist, contact your caregiver or return to this location.   For the first 24 hours, keep the injured extremity elevated on pillows while lying down.   Apply ice for 15 to 20 minutes to the sore joint every couple hours while awake for the first half day. Then 3 to 4 times per day for the first 48 hours. Put the ice in a plastic bag and place a towel between the bag of ice and your skin.   Wear any splinting, casting, elastic bandage applications, or slings as instructed.   Only take over-the-counter or prescription medicines for pain,  discomfort, or fever as directed by your caregiver. Do not use aspirin immediately after the injury unless instructed by your physician. Aspirin can cause increased bleeding and bruising of the tissues.   If you were given crutches, continue to use them as instructed and do not resume weight bearing on the sore joint until instructed.  Persistent pain and inability to use the sore joint as directed for more than 2 to 3 days are warning signs indicating that you should see a caregiver for a follow-up visit as soon as possible. Initially, a hairline fracture (break in bone) may not be evident on X-rays. Persistent pain and swelling indicate that further evaluation, non-weight bearing or use of the joint (use of crutches or slings as instructed), or further X-rays are indicated. X-rays may sometimes not show a small fracture until a week or 10 days later. Make a follow-up appointment with your own caregiver or one to whom we have referred you. A radiologist (specialist in reading X-rays) may read your X-rays. Make sure you know how you are to obtain your X-ray results. Do not assume everything is normal if you do not hear from us. SEEK MEDICAL CARE IF: Bruising, swelling, or pain increases. SEEK IMMEDIATE MEDICAL CARE IF:   Your fingers or toes are numb or blue.   The pain is not responding to medications and continues to stay the same or get worse.   The pain in your joint becomes severe.   You develop a fever over   102 F (38.9 C).   It becomes impossible to move or use the joint.  MAKE SURE YOU:   Understand these instructions.   Will watch your condition.   Will get help right away if you are not doing well or get worse.  Document Released: 05/31/2005 Document Revised: 05/20/2011 Document Reviewed: 01/17/2008 [facility] Patient Information 2012 [place], [place].Cervical Sprain A cervical sprain is an injury in the neck in which the ligaments are stretched or torn. The ligaments are the  tissues that hold the bones of the neck (vertebrae) in place.Cervical sprains can range from very mild to very severe. Most cervical sprains get better in 1 to 3 weeks, but it depends on the cause and extent of the injury. Severe cervical sprains can cause the neck vertebrae to be unstable. This can lead to damage of the spinal cord and can result in serious nervous system problems. Your caregiver will determine whether your cervical sprain is mild or severe. CAUSES  Severe cervical sprains may be caused by:  Contact sport injuries (football, rugby, wrestling, hockey, auto racing, gymnastics, diving, martial arts, boxing).   Motor vehicle collisions.   Whiplash injuries. This means the neck is forcefully whipped backward and forward.   Falls.  Mild cervical sprains may be caused by:   Awkward positions, such as cradling a telephone between your ear and shoulder.   Sitting in a chair that does not offer proper support.   Working at a poorly Marketing executive station.   Activities that require looking up or down for long periods of time.  SYMPTOMS   Pain, soreness, stiffness, or a burning sensation in the front, back, or sides of the neck. This discomfort may develop immediately after injury or it may develop slowly and not begin for 24 hours or more after an injury.   Pain or tenderness directly in the middle of the back of the neck.   Shoulder or upper back pain.   Limited ability to move the neck.   Headache.   Dizziness.   Weakness, numbness, or tingling in the hands or arms.   Muscle spasms.   Difficulty swallowing or chewing.   Tenderness and swelling of the neck.  DIAGNOSIS  Most of the time, your caregiver can diagnose this problem by taking your history and doing a physical exam. Your caregiver will ask about any known problems, such as arthritis in the neck or a previous neck injury. X-rays may be taken to find out if there are any other problems, such as problems  with the bones of the neck. However, an X-ray often does not reveal the full extent of a cervical sprain. Other tests such as a computed tomography (CT) scan or magnetic resonance imaging (MRI) may be needed. TREATMENT  Treatment depends on the severity of the cervical sprain. Mild sprains can be treated with rest, keeping the neck in place (immobilization), and pain medicines. Severe cervical sprains need immediate immobilization and an appointment with an orthopedist or neurosurgeon. Several treatment options are available to help with pain, muscle spasms, and other symptoms. Your caregiver may prescribe:  Medicines, such as pain relievers, numbing medicines, or muscle relaxants.   Physical therapy. This can include stretching exercises, strengthening exercises, and posture training. Exercises and improved posture can help stabilize the neck, strengthen muscles, and help stop symptoms from returning.   A neck collar to be worn for short periods of time. Often, these collars are worn for comfort. However, certain collars may be worn to  protect the neck and prevent further worsening of a serious cervical sprain.  HOME CARE INSTRUCTIONS   Put ice on the injured area.   Put ice in a plastic bag.   Place a towel between your skin and the bag.   Leave the ice on for 15 to 20 minutes, 3 to 4 times a day.   Only take over-the-counter or prescription medicines for pain, discomfort, or fever as directed by your caregiver.   Keep all follow-up appointments as directed by your caregiver.   Keep all physical therapy appointments as directed by your caregiver.   If a neck collar is prescribed, wear it as directed by your caregiver.   Do not drive while wearing a neck collar.   Make any needed adjustments to your work station to promote good posture.   Avoid positions and activities that make your symptoms worse.   Warm up and stretch before being active to help prevent problems.  SEEK MEDICAL  CARE IF:   Your pain is not controlled with medicine.   You are unable to decrease your pain medicine over time as planned.   Your activity level is not improving as expected.  SEEK IMMEDIATE MEDICAL CARE IF:   You develop any bleeding, stomach upset, or signs of an allergic reaction to your medicine.   Your symptoms get worse.   You develop new, unexplained symptoms.   You have numbness, tingling, weakness, or paralysis in any part of your body.  MAKE SURE YOU:   Understand these instructions.   Will watch your condition.   Will get help right away if you are not doing well or get worse.  Document Released: 03/28/2007 Document Revised: 05/20/2011 Document Reviewed: 03/03/2011 [facility] Patient Information 2012 [place], [place].Lumbosacral Strain Lumbosacral strain is one of the most common causes of back pain. There are many causes of back pain. Most are not serious conditions. CAUSES  Your backbone (spinal column) is made up of 24 main vertebral bodies, the sacrum, and the coccyx. These are held together by muscles and tough, fibrous tissue (ligaments). Nerve roots pass through the openings between the vertebrae. A sudden move or injury to the back may cause injury to, or pressure on, these nerves. This may result in localized back pain or pain movement (radiation) into the buttocks, down the leg, and into the foot. Sharp, shooting pain from the buttock down the back of the leg (sciatica) is frequently associated with a ruptured (herniated) disk. Pain may be caused by muscle spasm alone. Your caregiver can often find the cause of your pain by the details of your symptoms and an exam. In some cases, you may need tests (such as X-rays). Your caregiver will work with you to decide if any tests are needed based on your specific exam. HOME CARE INSTRUCTIONS   Avoid an underactive lifestyle. Active exercise, as directed by your caregiver, is your greatest weapon against back pain.   Avoid  hard physical activities (tennis, racquetball, waterskiing) if you are not in proper physical condition for it. This may aggravate or create problems.   If you have a back problem, avoid sports requiring sudden body movements. Swimming and walking are generally safer activities.   Maintain good posture.   Avoid becoming overweight (obese).   Use bed rest for only the most extreme, sudden (acute) episode. Your caregiver will help you determine how much bed rest is necessary.   For acute conditions, you may put ice on the injured area.  Put ice in a plastic bag.   Place a towel between your skin and the bag.   Leave the ice on for 15 to 20 minutes at a time, every 2 hours, or as needed.   After you are improved and more active, it may help to apply heat for 30 minutes before activities.  See your caregiver if you are having pain that lasts longer than expected. Your caregiver can advise appropriate exercises or therapy if needed. With conditioning, most back problems can be avoided. SEEK IMMEDIATE MEDICAL CARE IF:   You have numbness, tingling, weakness, or problems with the use of your arms or legs.   You experience severe back pain not relieved with medicines.   There is a change in bowel or bladder control.   You have increasing pain in any area of the body, including your belly (abdomen).   You notice shortness of breath, dizziness, or feel faint.   You feel sick to your stomach (nauseous), are throwing up (vomiting), or become sweaty.   You notice discoloration of your toes or legs, or your feet get very cold.   Your back pain is getting worse.   You have a fever.  MAKE SURE YOU:   Understand these instructions.   Will watch your condition.   Will get help right away if you are not doing well or get worse.  Document Released: 03/10/2005 Document Revised: 05/20/2011 Document Reviewed: 08/30/2008 [facility] Patient Information 2012 [place], [place].

## 2011-11-29 NOTE — ED Notes (Signed)
Pt placed in c-collar
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
## 2011-11-30 NOTE — ED Provider Notes (Signed)
Medical screening examination/treatment/procedure(s) were performed by non-physician practitioner and as supervising physician I was immediately available for consultation/collaboration.  Flint Melter, MD 11/30/11 2022

## 2011-12-28 ENCOUNTER — Ambulatory Visit (HOSPITAL_COMMUNITY)
Admission: RE | Admit: 2011-12-28 | Discharge: 2011-12-28 | Disposition: A | Discharge status: Home / Self Care | Payer: No Typology Code available for payment source | Source: Ambulatory Visit | Attending: Chiropractic Medicine | Admitting: Chiropractic Medicine

## 2011-12-28 ENCOUNTER — Other Ambulatory Visit (HOSPITAL_COMMUNITY): Payer: Self-pay | Admitting: Chiropractic Medicine

## 2011-12-28 DIAGNOSIS — R52 Pain, unspecified: Secondary | ICD-10-CM

## 2011-12-28 DIAGNOSIS — M25569 Pain in unspecified knee: Secondary | ICD-10-CM | POA: Insufficient documentation

## 2012-01-12 ENCOUNTER — Other Ambulatory Visit (HOSPITAL_COMMUNITY): Payer: Self-pay | Admitting: Chiropractic Medicine

## 2012-01-12 DIAGNOSIS — R52 Pain, unspecified: Secondary | ICD-10-CM

## 2012-01-15 ENCOUNTER — Ambulatory Visit (HOSPITAL_COMMUNITY)
Admission: RE | Admit: 2012-01-15 | Discharge: 2012-01-15 | Disposition: A | Discharge status: Home / Self Care | Payer: No Typology Code available for payment source | Source: Ambulatory Visit | Attending: Chiropractic Medicine | Admitting: Chiropractic Medicine

## 2012-01-15 DIAGNOSIS — M659 Unspecified synovitis and tenosynovitis, unspecified site: Secondary | ICD-10-CM | POA: Insufficient documentation

## 2012-01-15 DIAGNOSIS — M6740 Ganglion, unspecified site: Secondary | ICD-10-CM | POA: Insufficient documentation

## 2012-01-15 DIAGNOSIS — R52 Pain, unspecified: Secondary | ICD-10-CM

## 2012-01-15 DIAGNOSIS — X58XXXA Exposure to other specified factors, initial encounter: Secondary | ICD-10-CM | POA: Insufficient documentation

## 2012-01-15 DIAGNOSIS — IMO0002 Reserved for concepts with insufficient information to code with codable children: Secondary | ICD-10-CM | POA: Insufficient documentation

## 2012-01-15 DIAGNOSIS — M25569 Pain in unspecified knee: Secondary | ICD-10-CM | POA: Insufficient documentation

## 2012-01-19 ENCOUNTER — Encounter (HOSPITAL_COMMUNITY): Payer: Self-pay

## 2012-01-19 ENCOUNTER — Emergency Department (INDEPENDENT_AMBULATORY_CARE_PROVIDER_SITE_OTHER)
Admission: EM | Admit: 2012-01-19 | Discharge: 2012-01-19 | Disposition: A | Discharge status: Home / Self Care | Payer: Self-pay | Source: Home / Self Care | Attending: Emergency Medicine | Admitting: Emergency Medicine

## 2012-01-19 DIAGNOSIS — N390 Urinary tract infection, site not specified: Secondary | ICD-10-CM

## 2012-01-19 LAB — POCT URINALYSIS DIP (DEVICE)
Glucose, UA: NEGATIVE mg/dL
Ketones, ur: NEGATIVE mg/dL
Leukocytes, UA: NEGATIVE
pH: 6 (ref 5.0–8.0)

## 2012-01-19 MED ORDER — NITROFURANTOIN MONOHYD MACRO 100 MG PO CAPS: 100.0000 mg | ORAL_CAPSULE | Freq: Two times a day (BID) | ORAL | Status: AC

## 2012-01-19 NOTE — ED Provider Notes (Signed)
History     CSN: [id number]  Arrival date & time 01/19/12  1100   First MD Initiated Contact with Patient 01/19/12 1110      Chief Complaint  Patient presents with  . Urinary Tract Infection    (Consider location/radiation/quality/duration/timing/severity/associated sxs/prior treatment) HPI Comments: Patient presents urgent care this morning complaining of increased frequency urgency and burning with urination. "Feels like when done I still want to urinate more". Some abdominal pressure (patient points to suprapubic region). Vision denies any fevers, generalized malaise headaches, nausea vomiting or flank pain.  Patient is a 51 y.o. female presenting with urinary tract infection. The history is provided by the patient.  Urinary Tract Infection This is a new problem. The current episode started 2 days ago. The problem occurs constantly. The problem has not changed since onset.Associated symptoms include abdominal pain. Pertinent negatives include no shortness of breath. The treatment provided no relief.    Past Medical History  Diagnosis Date  . Arthritis   . COPD (chronic obstructive pulmonary disease)   . Hypertension   . Generalized headaches   . Leg swelling   . Abdominal pain     Past Surgical History  Procedure Date  . Hernia repair 2010  . Colon surgery 2010    per patient "colon take down"    Family History  Problem Relation Age of Onset  . Heart disease Mother 23  . Heart failure Mother   . Asthma Mother   . Alzheimer's disease Father 69    History  Substance Use Topics  . Smoking status: Current Everyday Smoker -- 1.0 packs/day for 25 years  . Smokeless tobacco: Never Used  . Alcohol Use: No    OB History    Grav Para Term Preterm Abortions TAB SAB Ect Mult Living                  Review of Systems  Constitutional: Negative for chills, diaphoresis, activity change and appetite change.  Respiratory: Negative for shortness of breath.     Gastrointestinal: Positive for abdominal pain. Negative for constipation.  Genitourinary: Positive for dysuria, urgency and frequency. Negative for hematuria, flank pain, decreased urine volume, vaginal bleeding, vaginal discharge, enuresis, genital sores, vaginal pain and menstrual problem.  Skin: Negative for rash and wound.    Allergies  Aspirin; Codeine; and Penicillins  Home Medications   Current Outpatient Rx  Name Route Sig Dispense Refill  . HYDROCHLOROTHIAZIDE 25 MG PO TABS Oral Take 25 mg by mouth daily.      . IBUPROFEN 200 MG PO TABS Oral Take 400 mg by mouth every 6 (six) hours as needed. For pain    . ONE-DAILY MULTI VITAMINS PO TABS Oral Take 1 tablet by mouth daily.      Marland Kitchen NITROFURANTOIN MONOHYD MACRO 100 MG PO CAPS Oral Take 1 capsule (100 mg total) by mouth 2 (two) times daily. 14 capsule 0    BP 135/86  Pulse 64  Temp 98.3 F (36.8 C) (Oral)  Resp 18  SpO2 96%  Physical Exam  Nursing note and vitals reviewed. Constitutional: Vital signs are normal. She appears well-developed and well-nourished.  Non-toxic appearance. She does not have a sickly appearance. She does not appear ill. No distress.    ED Course  Procedures (including critical care time)  Labs Reviewed  POCT URINALYSIS DIP (DEVICE) - Abnormal; Notable for the following:    Bilirubin Urine SMALL (*)     Hgb urine dipstick TRACE (*)  Protein, ur 30 (*)     All other components within normal limits   No results found.   1. Urinary tract infection       MDM  Dysuria for 2 days. Afebrile no abdominal or flank pain. Minimal suprapubic tenderness. Patient looks comfortable will start patient on Macrobid for 7 days. Patient was advised about symptoms that would warrant further evaluation or warrant her return.      Jimmie Molly, MD 01/19/12 [phone]

## 2012-01-19 NOTE — ED Notes (Signed)
Describes frequency, urgency, burning, spasm sensation with urination for past 2 days; NAD at present w c/o low abdominal discomfort

## 2012-05-30 ENCOUNTER — Encounter (HOSPITAL_COMMUNITY): Payer: Self-pay

## 2012-05-30 ENCOUNTER — Emergency Department (HOSPITAL_COMMUNITY)
Admission: EM | Admit: 2012-05-30 | Discharge: 2012-05-30 | Disposition: A | Discharge status: Home / Self Care | Payer: No Typology Code available for payment source | Source: Home / Self Care

## 2012-05-30 DIAGNOSIS — K439 Ventral hernia without obstruction or gangrene: Secondary | ICD-10-CM

## 2012-05-30 DIAGNOSIS — I10 Essential (primary) hypertension: Secondary | ICD-10-CM

## 2012-05-30 DIAGNOSIS — F17200 Nicotine dependence, unspecified, uncomplicated: Secondary | ICD-10-CM

## 2012-05-30 MED ORDER — HYDROCHLOROTHIAZIDE 25 MG PO TABS: 25.0000 mg | ORAL_TABLET | Freq: Every day | ORAL | Status: DC

## 2012-05-30 NOTE — ED Notes (Signed)
Patient states [facility]-needs medication refill.  Also states has had prior colon surgeries  And having some issues with constipation

## 2012-05-30 NOTE — ED Provider Notes (Signed)
Patient Demographics  Maria Oliver, is a 51 y.o. female  ZOX:[id number]  WJX:[id number]  DOB - Nov 04, 1960  Chief Complaint  Patient presents with  . Medication Refill  . GI Problem        Subjective:   Maria Oliver today is here for to establish primary care and also to get a refill on her antihypertensive medication. She has a chronic ventral hernia that is currently not causing her any problems. She has no vomiting or abdominal pain. She is tolerating her diet and is having bowel movements. She had seen Dr. gross [facility]-he had offered to fix this hernia if the patient quit smoking, however she has no intention to quit smoking. She has not seen a Careers adviser since then. In 2010, patient had a colonoscopy with sigmoid polypectomy, unfortunately this was complicated by a sigmoid perforation and development of peritonitis. 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 This was reversed, however she had numerous complications including wound infection.  Patient has No headache, No chest pain, No abdominal pain - No Nausea, No new weakness tingling or numbness, No Cough - SOB.   Objective:    Filed Vitals:   05/30/12 1311  BP: 142/80  Pulse: 65  Temp: 98.1 F (36.7 C)  TempSrc: Oral  Resp: 20  SpO2: 97%     ALLERGIES:   Allergies  Allergen Reactions  . Aspirin     REACTION: GI upset  . Codeine     REACTION: GI upset  . Penicillins     REACTION: rash    PAST MEDICAL HISTORY: Past Medical History  Diagnosis Date  . Arthritis   . COPD (chronic obstructive pulmonary disease)   . Hypertension   . Generalized headaches   . Leg swelling   . Abdominal pain     PAST SURGICAL HISTORY: Past Surgical History  Procedure Date  . Hernia repair 2010  . Colon surgery 2010    per patient "colon take down"    FAMILY HISTORY: Family History  Problem Relation Age of Onset  . Heart disease Mother 67  . Heart failure Mother   . Asthma  Mother   . Alzheimer's disease Father 72    MEDICATIONS AT HOME: Prior to Admission medications   Medication Sig Start Date End Date Taking? Authorizing Provider  Multiple Vitamin (MULTIVITAMIN) tablet Take 1 tablet by mouth daily.     Yes Historical Provider, MD  hydrochlorothiazide (HYDRODIURIL) 25 MG tablet Take 1 tablet (25 mg total) by mouth daily. 06/12/12   Shanker Levora Dredge, MD  ibuprofen (ADVIL,MOTRIN) 200 MG tablet Take 400 mg by mouth every 6 (six) hours as needed. For pain    Historical Provider, MD    REVIEW OF SYSTEMS:  Constitutional:   No   Fevers, chills, fatigue.  HEENT:    No headaches, Sore throat,   Cardio-vascular: No chest pain,  Orthopnea, swelling in lower extremities, anasarca, palpitations  GI:  No abdominal pain, nausea, vomiting, diarrhea  Resp: No shortness of breath,  No coughing up of blood.No cough.No wheezing.  Skin:  no rash or lesions.  GU:  no dysuria, change in color of urine, no urgency or frequency.  No flank pain.  Musculoskeletal: No joint pain or swelling.  No decreased range of motion.  No back pain.  Psych: No change in mood or affect. No depression or anxiety.  No memory loss.   Exam  General appearance :Awake, alert, not in any distress. Speech Clear. Not toxic  Looking HEENT: Atraumatic and Normocephalic, pupils equally reactive to light and accomodation Neck: supple, no JVD. No cervical lymphadenopathy.  Chest:Good air entry bilaterally, no added sounds  CVS: S1 S2 regular, no murmurs.  Abdomen: Bowel sounds present, Non tender and not distended with no gaurding, rigidity or rebound. Ventral hernia in place. Extremities: B/L Lower Ext shows no edema, both legs are warm to touch Neurology: Awake alert, and oriented X 3, CN II-XII intact, Non focal Skin:No Rash Wounds:N/A    Data Review   CBC No results found for this basename:  WBC:5,HGB:5,HCT:5,PLT:5,MCV:5,MCH:5,MCHC:5,RDW:5,NEUTRABS:5,LYMPHSABS:5,MONOABS:5,EOSABS:5,BASOSABS:5,BANDABS:5,BANDSABD:5 in the last 168 hours  Chemistries   No results found for this basename: NA:5,K:5,CL:5,CO2:5,GLUCOSE:5,BUN:5,CREATININE:5,GFRCGP,:5,CALCIUM:5,MG:5,AST:5,ALT:5,ALKPHOS:5,BILITOT:5 in the last 168 hours ------------------------------------------------------------------------------------------------------------------ No results found for this basename: HGBA1C:2 in the last 72 hours ------------------------------------------------------------------------------------------------------------------ No results found for this basename: CHOL:2,HDL:2,LDLCALC:2,TRIG:2,CHOLHDL:2,LDLDIRECT:2 in the last 72 hours ------------------------------------------------------------------------------------------------------------------ No results found for this basename: TSH,T4TOTAL,FREET3,T3FREE,THYROIDAB in the last 72 hours ------------------------------------------------------------------------------------------------------------------ No results found for this basename: VITAMINB12:2,FOLATE:2,FERRITIN:2,TIBC:2,IRON:2,RETICCTPCT:2 in the last 72 hours  Coagulation profile  No results found for this basename: INR:5,PROTIME:5 in the last 168 hours    Assessment & Plan   Hypertension - Seems to be well controlled with hydrochlorothiazide-continue. Reevaluate the need for adding a second agent during next visit  Ventral hernia - Asymptomatic, she has been seen by surgery in the past and has been told to quit smoking before the middle repair the hernia. Currently the hernia is asymptomatic.  Tobacco abuse - She smokes approximately a pack a day. She currently has no intention of quitting. She has been counseled extensively by me. She understands the risks.  Gen. health maintenance - Colonoscopy-2010 - Mammography-2010 -? Not sure when her last Pap smear was - Refuses a flu shot-allergic  to eggs  We need to make sure she is compliant with followup, before commencing on further health maintenance.   Follow-up Information    Follow up with [facility]. Schedule an appointment as soon as possible for a visit in 4 weeks.          Maretta Bees, MD 05/30/12 [phone]

## 2012-08-02 ENCOUNTER — Emergency Department (HOSPITAL_COMMUNITY)
Admission: EM | Admit: 2012-08-02 | Discharge: 2012-08-02 | Disposition: A | Discharge status: Home / Self Care | Payer: No Typology Code available for payment source | Attending: Emergency Medicine | Admitting: Emergency Medicine

## 2012-08-02 ENCOUNTER — Encounter (HOSPITAL_COMMUNITY): Payer: Self-pay | Admitting: Emergency Medicine

## 2012-08-02 DIAGNOSIS — R51 Headache: Secondary | ICD-10-CM | POA: Insufficient documentation

## 2012-08-02 DIAGNOSIS — R0789 Other chest pain: Secondary | ICD-10-CM | POA: Insufficient documentation

## 2012-08-02 DIAGNOSIS — Z79899 Other long term (current) drug therapy: Secondary | ICD-10-CM | POA: Insufficient documentation

## 2012-08-02 DIAGNOSIS — J329 Chronic sinusitis, unspecified: Secondary | ICD-10-CM

## 2012-08-02 DIAGNOSIS — H9209 Otalgia, unspecified ear: Secondary | ICD-10-CM | POA: Insufficient documentation

## 2012-08-02 DIAGNOSIS — Z8739 Personal history of other diseases of the musculoskeletal system and connective tissue: Secondary | ICD-10-CM | POA: Insufficient documentation

## 2012-08-02 DIAGNOSIS — I10 Essential (primary) hypertension: Secondary | ICD-10-CM | POA: Insufficient documentation

## 2012-08-02 DIAGNOSIS — J069 Acute upper respiratory infection, unspecified: Secondary | ICD-10-CM

## 2012-08-02 DIAGNOSIS — R52 Pain, unspecified: Secondary | ICD-10-CM | POA: Insufficient documentation

## 2012-08-02 DIAGNOSIS — R6889 Other general symptoms and signs: Secondary | ICD-10-CM | POA: Insufficient documentation

## 2012-08-02 DIAGNOSIS — F17200 Nicotine dependence, unspecified, uncomplicated: Secondary | ICD-10-CM | POA: Insufficient documentation

## 2012-08-02 DIAGNOSIS — J449 Chronic obstructive pulmonary disease, unspecified: Secondary | ICD-10-CM | POA: Insufficient documentation

## 2012-08-02 DIAGNOSIS — J4489 Other specified chronic obstructive pulmonary disease: Secondary | ICD-10-CM | POA: Insufficient documentation

## 2012-08-02 MED ORDER — SULFAMETHOXAZOLE-TRIMETHOPRIM 800-160 MG PO TABS: 1.0000 | ORAL_TABLET | Freq: Two times a day (BID) | ORAL | Status: DC

## 2012-08-02 MED ORDER — HYDROCODONE-ACETAMINOPHEN 5-325 MG PO TABS: ORAL_TABLET | ORAL | Status: DC

## 2012-08-02 MED ORDER — PREDNISONE 10 MG PO TABS: ORAL_TABLET | ORAL | Status: DC

## 2012-08-02 NOTE — ED Provider Notes (Signed)
History  This chart was scribed for non-physician practitioner working with Flint Melter, MD by Ardeen Jourdain, ED Scribe. This patient was seen in room TR09C/TR09C and the patient's care was started at 1900.  CSN: [id number]  Arrival date & time 08/02/12  1732   None     Chief Complaint  Patient presents with  . Cough    Patient is a 52 y.o. female presenting with cough. The history is provided by the patient. No language interpreter was used.  Cough Cough characteristics:  Productive Sputum characteristics:  Nondescript Severity:  Moderate Onset quality:  Gradual Duration:  6 days Timing:  Constant Progression:  Worsening Chronicity:  New Smoker: yes   Context: not sick contacts   Relieved by:  Nothing Worsened by:  Deep breathing, lying down and activity Ineffective treatments:  Cough suppressants and decongestant Associated symptoms: ear pain and headaches   Associated symptoms: no chest pain and no chills   Ear pain:    Location:  Left   Severity:  Mild   Onset quality:  Gradual   Timing:  Intermittent   Progression:  Unchanged   Chronicity:  New Headaches:    Severity:  Mild   Onset quality:  Gradual   Timing:  Intermittent   Progression:  Waxing and waning   Chronicity:  New Risk factors: no chemical exposure, no recent infection and no recent travel     LIVIANNA PETRAGLIA is a 52 y.o. female with a h/o COPD who presents to the Emergency Department complaining of cough with associated generalized body aches, HA, ear pain, chest tightness and neck stiffness. She states the current symptoms began 6 days ago and have been gradually worsening. She states the pain in her neck began to radiate into her shoulder 1 day ago. She locates the HA to the front of her face and head. She states the ear pain is located behind her left ear. She reports taking Advil with no relief.    Past Medical History  Diagnosis Date  . Arthritis   . COPD (chronic obstructive pulmonary  disease)   . Hypertension   . Generalized headaches   . Leg swelling   . Abdominal pain     Past Surgical History  Procedure Laterality Date  . Hernia repair  2010  . Colon surgery  2010    per patient "colon take down"    Family History  Problem Relation Age of Onset  . Heart disease Mother 4  . Heart failure Mother   . Asthma Mother   . Alzheimer's disease Father 41    History  Substance Use Topics  . Smoking status: Current Every Day Smoker -- 1.00 packs/day for 25 years  . Smokeless tobacco: Never Used  . Alcohol Use: No   No OB history available.   Review of Systems  Constitutional: Negative for chills.  HENT: Positive for ear pain.   Respiratory: Positive for cough and chest tightness.   Cardiovascular: Negative for chest pain.  Gastrointestinal: Negative for nausea, vomiting and diarrhea.  Neurological: Positive for headaches.  All other systems reviewed and are negative.    Allergies  Aspirin; Codeine; and Penicillins  Home Medications   Current Outpatient Rx  Name  Route  Sig  Dispense  Refill  . Docosanol (ABREVA) 10 % CREA   Apply externally   Apply 1 application topically daily as needed (for fever blister).         . hydrochlorothiazide (HYDRODIURIL) 25 MG tablet  Oral   Take 1 tablet (25 mg total) by mouth daily.   30 tablet   2   . Multiple Vitamin (MULTIVITAMIN) tablet   Oral   Take 1 tablet by mouth daily.             Triage Vitals: BP 99/65  Pulse 83  Temp(Src) 98.2 F (36.8 C) (Oral)  SpO2 96%  Physical Exam  Nursing note and vitals reviewed. Constitutional: She is oriented to person, place, and time. She appears well-developed and well-nourished. No distress.  HENT:  Head: Normocephalic and atraumatic.  Right Ear: External ear normal.  Left Ear: External ear normal.  Pain to percussion to frontal and maxillary sinuses, TM within normal limits, mold soreness behind left ear, no redness or swelling, airway patent and  speech clear  Eyes: EOM are normal. Pupils are equal, round, and reactive to light.  Neck: Normal range of motion. Neck supple. No tracheal deviation present.  Cardiovascular: Normal rate, regular rhythm and normal heart sounds.  Exam reveals no gallop and no friction rub.   No murmur heard. Pulmonary/Chest: Effort normal and breath sounds normal. No respiratory distress. She has no wheezes. She has no rales. She exhibits no tenderness.  Abdominal: Soft. She exhibits no distension.  Musculoskeletal: Normal range of motion. She exhibits no edema.  Neurological: She is alert and oriented to person, place, and time.  Skin: Skin is warm and dry. No rash noted.  Psychiatric: She has a normal mood and affect. Her behavior is normal.    ED Course  Procedures (including critical care time)  DIAGNOSTIC STUDIES: Oxygen Saturation is 96% on room air, adequate by my interpretation.    COORDINATION OF CARE:  7:08 PM: Discussed treatment plan which includes antibiotics and  with pt at bedside and pt agreed to plan.     Labs Reviewed - No data to display No results found.   No diagnosis found.    MDM  I have reviewed nursing notes, vital signs, and all appropriate lab and imaging results for this patient.  Patient presents to the emergency department with 6-10 days of viral type illness complaints. Patient states this problem started with diarrhea, and then progressed into facial pressure, sinus congestion, cough, and headache. The patient denies any high fever. States she's been trying over-the-counter medications without improvement. A  The plan at this time is for the patient to be on Sudafed 3 times daily for congestion, prednisone taper, Septra DS 2 times daily, and Norco every 4 hours as needed for pain. Patient is encouraged to see her primary care physician or return to the emergency department if not improving.      Kathie Dike, [place] 08/02/12 1919

## 2012-08-02 NOTE — ED Notes (Signed)
Pt c/o left ear pain, facial pressure and productive cough x's 6 days.  Has been taking OTC meds without relief.

## 2012-08-03 NOTE — ED Provider Notes (Signed)
Medical screening examination/treatment/procedure(s) were performed by non-physician practitioner and as supervising physician I was immediately available for consultation/collaboration.   Anniyah Mood L Dontrae Morini, MD 08/03/12 0027 

## 2012-10-20 ENCOUNTER — Emergency Department (HOSPITAL_COMMUNITY): Payer: Self-pay

## 2012-10-20 ENCOUNTER — Encounter (HOSPITAL_COMMUNITY): Payer: Self-pay | Admitting: Emergency Medicine

## 2012-10-20 ENCOUNTER — Emergency Department (HOSPITAL_COMMUNITY)
Admission: EM | Admit: 2012-10-20 | Discharge: 2012-10-20 | Disposition: A | Discharge status: Home / Self Care | Payer: Self-pay | Attending: Emergency Medicine | Admitting: Emergency Medicine

## 2012-10-20 DIAGNOSIS — R197 Diarrhea, unspecified: Secondary | ICD-10-CM | POA: Insufficient documentation

## 2012-10-20 DIAGNOSIS — I10 Essential (primary) hypertension: Secondary | ICD-10-CM | POA: Insufficient documentation

## 2012-10-20 DIAGNOSIS — F17200 Nicotine dependence, unspecified, uncomplicated: Secondary | ICD-10-CM | POA: Insufficient documentation

## 2012-10-20 DIAGNOSIS — G8929 Other chronic pain: Secondary | ICD-10-CM

## 2012-10-20 DIAGNOSIS — J4489 Other specified chronic obstructive pulmonary disease: Secondary | ICD-10-CM | POA: Insufficient documentation

## 2012-10-20 DIAGNOSIS — Z79899 Other long term (current) drug therapy: Secondary | ICD-10-CM | POA: Insufficient documentation

## 2012-10-20 DIAGNOSIS — J449 Chronic obstructive pulmonary disease, unspecified: Secondary | ICD-10-CM | POA: Insufficient documentation

## 2012-10-20 DIAGNOSIS — Z8739 Personal history of other diseases of the musculoskeletal system and connective tissue: Secondary | ICD-10-CM | POA: Insufficient documentation

## 2012-10-20 DIAGNOSIS — Z8679 Personal history of other diseases of the circulatory system: Secondary | ICD-10-CM | POA: Insufficient documentation

## 2012-10-20 DIAGNOSIS — M542 Cervicalgia: Secondary | ICD-10-CM | POA: Insufficient documentation

## 2012-10-20 LAB — CBC WITH DIFFERENTIAL/PLATELET
Eosinophils Absolute: 0.3 10*3/uL (ref 0.0–0.7)
Eosinophils Relative: 3 % (ref 0–5)
HCT: 41.8 % (ref 36.0–46.0)
Hemoglobin: 15 g/dL (ref 12.0–15.0)
Lymphs Abs: 2.5 10*3/uL (ref 0.7–4.0)
MCH: 30.2 pg (ref 26.0–34.0)
MCV: 84.3 fL (ref 78.0–100.0)
Monocytes Relative: 7 % (ref 3–12)
RBC: 4.96 MIL/uL (ref 3.87–5.11)

## 2012-10-20 LAB — COMPREHENSIVE METABOLIC PANEL
AST: 17 U/L (ref 0–37)
Albumin: 4 g/dL (ref 3.5–5.2)
Calcium: 9.4 mg/dL (ref 8.4–10.5)
Creatinine, Ser: 0.7 mg/dL (ref 0.50–1.10)

## 2012-10-20 MED ORDER — HYDROCODONE-ACETAMINOPHEN 5-325 MG PO TABS
1.0000 | ORAL_TABLET | Freq: Once | ORAL | Status: AC
  Administered 2012-10-20: 1 via ORAL
  Filled 2012-10-20: qty 1

## 2012-10-20 MED ORDER — DIAZEPAM 5 MG PO TABS
5.0000 mg | ORAL_TABLET | Freq: Once | ORAL | Status: AC
  Administered 2012-10-20: 5 mg via ORAL
  Filled 2012-10-20: qty 1

## 2012-10-20 MED ORDER — DIAZEPAM 5 MG PO TABS: ORAL_TABLET | ORAL | Status: DC

## 2012-10-20 MED ORDER — SODIUM CHLORIDE 0.9 % IV BOLUS (SEPSIS)
500.0000 mL | Freq: Once | INTRAVENOUS | Status: AC
  Administered 2012-10-20: 500 mL via INTRAVENOUS

## 2012-10-20 MED ORDER — DIPHENOXYLATE-ATROPINE 2.5-0.025 MG PO TABS
2.0000 | ORAL_TABLET | ORAL | Status: AC
  Administered 2012-10-20: 2 via ORAL
  Filled 2012-10-20: qty 2

## 2012-10-20 MED ORDER — HYDROCHLOROTHIAZIDE 25 MG PO TABS: 25.0000 mg | ORAL_TABLET | Freq: Every day | ORAL | Status: DC

## 2012-10-20 MED ORDER — DIPHENOXYLATE-ATROPINE 2.5-0.025 MG PO TABS: 1.0000 | ORAL_TABLET | Freq: Four times a day (QID) | ORAL | Status: DC | PRN

## 2012-10-20 NOTE — ED Notes (Signed)
Pt c/o diarrhea x several days; pt sts she works in [employer]; pt sts neck and upper back pain x several days and generalized weakness
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
## 2012-10-21 NOTE — ED Provider Notes (Signed)
Medical screening examination/treatment/procedure(s) were performed by non-physician practitioner and as supervising physician I was immediately available for consultation/collaboration.   Carleene Cooper III, MD 10/21/12 [phone]

## 2012-12-20 ENCOUNTER — Emergency Department (INDEPENDENT_AMBULATORY_CARE_PROVIDER_SITE_OTHER)
Admission: EM | Admit: 2012-12-20 | Discharge: 2012-12-20 | Disposition: A | Discharge status: Home / Self Care | Payer: Self-pay | Source: Home / Self Care | Attending: Family Medicine | Admitting: Family Medicine

## 2012-12-20 DIAGNOSIS — L568 Other specified acute skin changes due to ultraviolet radiation: Secondary | ICD-10-CM

## 2012-12-20 DIAGNOSIS — L564 Polymorphous light eruption: Secondary | ICD-10-CM

## 2012-12-20 DIAGNOSIS — N390 Urinary tract infection, site not specified: Secondary | ICD-10-CM

## 2012-12-20 LAB — POCT URINALYSIS DIP (DEVICE)
Bilirubin Urine: NEGATIVE
Nitrite: NEGATIVE
Urobilinogen, UA: 0.2 mg/dL (ref 0.0–1.0)
pH: 6.5 (ref 5.0–8.0)

## 2012-12-20 MED ORDER — CIPROFLOXACIN HCL 500 MG PO TABS: 500.0000 mg | ORAL_TABLET | Freq: Two times a day (BID) | ORAL | Status: DC

## 2012-12-20 NOTE — ED Provider Notes (Signed)
History    CSN: [id number] Arrival date & time 12/20/12  1438  First MD Initiated Contact with Patient 12/20/12 1618     Chief Complaint  Patient presents with  . Urinary Tract Infection  . Rash   (Consider location/radiation/quality/duration/timing/severity/associated sxs/prior Treatment) Patient is a 52 y.o. female presenting with dysuria. The history is provided by the patient.  Dysuria Pain quality:  Burning Pain severity:  Mild Onset quality:  Gradual Duration:  3 days Timing:  Constant Progression:  Worsening Chronicity:  New Recent urinary tract infections: no   Urinary symptoms: discolored urine and frequent urination   Urinary symptoms: no hematuria   Associated symptoms: no abdominal pain, no fever, no flank pain, no nausea and no vomiting    Past Medical History  Diagnosis Date  . Arthritis   . COPD (chronic obstructive pulmonary disease)   . Hypertension   . Generalized headaches   . Leg swelling   . Abdominal pain    Past Surgical History  Procedure Laterality Date  . Hernia repair  2010  . Colon surgery  2010    per patient "colon take down"   Family History  Problem Relation Age of Onset  . Heart disease Mother 36  . Heart failure Mother   . Asthma Mother   . Alzheimer's disease Father 19   History  Substance Use Topics  . Smoking status: Current Every Day Smoker -- 1.00 packs/day for 25 years  . Smokeless tobacco: Never Used  . Alcohol Use: No   OB History   Grav Para Term Preterm Abortions TAB SAB Ect Mult Living                 Review of Systems  Constitutional: Negative.  Negative for fever.  Gastrointestinal: Negative.  Negative for nausea, vomiting and abdominal pain.  Genitourinary: Positive for dysuria, urgency and frequency. Negative for flank pain.  Skin: Positive for rash.    Allergies  Aspirin; Codeine; and Penicillins  Home Medications   Current Outpatient Rx  Name  Route  Sig  Dispense  Refill  . ciprofloxacin  (CIPRO) 500 MG tablet   Oral   Take 1 tablet (500 mg total) by mouth 2 (two) times daily.   10 tablet   0   . diazepam (VALIUM) 5 MG tablet      Take one tablet every 6 hours as needed for muscle relaxant   6 tablet   0   . diphenoxylate-atropine (LOMOTIL) 2.5-0.025 MG per tablet   Oral   Take 1 tablet by mouth 4 (four) times daily as needed for diarrhea or loose stools.   28 tablet   0   . hydrochlorothiazide (HYDRODIURIL) 25 MG tablet   Oral   Take 1 tablet (25 mg total) by mouth daily.   30 tablet   2   . hydrochlorothiazide (HYDRODIURIL) 25 MG tablet   Oral   Take 1 tablet (25 mg total) by mouth daily.   30 tablet   1   . ibuprofen (ADVIL,MOTRIN) 200 MG tablet   Oral   Take 600 mg by mouth 2 (two) times daily as needed for pain. For neck pain         . Multiple Vitamin (MULTIVITAMIN) tablet   Oral   Take 1 tablet by mouth every other day.           BP 139/80  Pulse 63  Temp(Src) 97.9 F (36.6 C) (Oral)  Resp 18  SpO2 97% Physical  Exam  Nursing note and vitals reviewed. Constitutional: She is oriented to person, place, and time. She appears well-developed and well-nourished.  Abdominal: Soft. Bowel sounds are normal. She exhibits no distension and no mass. There is no tenderness. There is no rebound and no guarding.  Neurological: She is alert and oriented to person, place, and time.  Skin: Rash noted.  Local upper arm and ant chest erythema, ill defined, no lesions.    ED Course  Procedures (including critical care time) Labs Reviewed  POCT URINALYSIS DIP (DEVICE) - Abnormal; Notable for the following:    Hgb urine dipstick TRACE (*)    All other components within normal limits   No results found. 1. Polymorphic photodermatitis   2. UTI (lower urinary tract infection)     MDM    Linna Hoff, MD 12/20/12 1640

## 2012-12-20 NOTE — ED Notes (Signed)
Patient states that she has rash al over her body which does itch.  Patient has been using bendaryl but no relief.    Patient states UTI.  Does have burning when urinate and pain in lower back.  No medications taken.

## 2012-12-30 ENCOUNTER — Emergency Department (INDEPENDENT_AMBULATORY_CARE_PROVIDER_SITE_OTHER)
Admission: EM | Admit: 2012-12-30 | Discharge: 2012-12-30 | Disposition: A | Discharge status: Home / Self Care | Payer: Self-pay | Source: Home / Self Care | Attending: Emergency Medicine | Admitting: Emergency Medicine

## 2012-12-30 ENCOUNTER — Encounter (HOSPITAL_COMMUNITY): Payer: Self-pay | Admitting: Emergency Medicine

## 2012-12-30 DIAGNOSIS — N390 Urinary tract infection, site not specified: Secondary | ICD-10-CM

## 2012-12-30 LAB — POCT URINALYSIS DIP (DEVICE)
Ketones, ur: NEGATIVE mg/dL
Protein, ur: NEGATIVE mg/dL
pH: 7 (ref 5.0–8.0)

## 2012-12-30 MED ORDER — CEPHALEXIN 500 MG PO CAPS: 500.0000 mg | ORAL_CAPSULE | Freq: Three times a day (TID) | ORAL | Status: DC

## 2012-12-30 MED ORDER — PHENAZOPYRIDINE HCL 200 MG PO TABS: 200.0000 mg | ORAL_TABLET | Freq: Three times a day (TID) | ORAL | Status: DC | PRN

## 2012-12-30 MED ORDER — OXYCODONE-ACETAMINOPHEN 5-325 MG PO TABS: ORAL_TABLET | ORAL | Status: DC

## 2012-12-30 NOTE — ED Provider Notes (Signed)
Chief Complaint:   Chief Complaint  Patient presents with  . Urinary Tract Infection    was seen on 7/9 for uti. symptoms not any better pt states that she was unable to take cipro it made her feel bad.     History of Present Illness:   Maria Oliver is a 52 year old female who developed urinary symptoms on July 7 dysuria, frequency, urgency, and urinary order. In addition she felt achy, had back and abdominal pain. She was seen here on July 9 and sent home on Cipro, this caused nausea and aching in her knees and her shoulders. She stopped this medication around the 11th and her symptoms have come back again. She denies any fever, chills, nausea, vomiting, or GYN symptoms. She has had urinary tract infections in the past.  Review of Systems:  Other than noted above, the patient denies any of the following symptoms: General:  No fevers, chills, sweats, aches, or fatigue. GI:  No abdominal pain, back pain, nausea, vomiting, diarrhea, or constipation. GU:  No dysuria, frequency, urgency, hematuria, or incontinence. GYN:  No discharge, itching, vulvar pain or lesions, pelvic pain, or abnormal vaginal bleeding.  PMFSH:  Past medical history, family history, social history, meds, and allergies were reviewed.  She has high blood pressure and takes hydrochlorothiazide. She also has a large ventral hernia and a history of COPD. She is allergic to penicillin which has caused her to break out in hives but has never had any kind of reaction to cephalosporins. Her reaction to penicillin occurred many years ago when she was a small child. She's also intolerant to codeine and aspirin.  Physical Exam:   Vital signs:  BP 131/80  Pulse 89  Temp(Src) 98.9 F (37.2 C) (Oral)  Resp 17  SpO2 94% Gen:  Alert, oriented, in no distress. Lungs:  Clear to auscultation, no wheezes, rales or rhonchi. Heart:  Regular rhythm, no gallop or murmer. Abdomen:  Flat and soft. There was moderate suprapubic pain to  palpation.  No guarding, or rebound.  No hepato-splenomegaly or mass.  Bowel sounds were normally active.  There is a large hernia in the right lower quadrant which is moderately tender to touch. Back:  No CVA tenderness.  Skin:  Clear, warm and dry.  Labs:    Results for orders placed during the hospital encounter of 12/30/12  POCT URINALYSIS DIP (DEVICE)      Result Value Range   Glucose, UA NEGATIVE  NEGATIVE mg/dL   Bilirubin Urine NEGATIVE  NEGATIVE   Ketones, ur NEGATIVE  NEGATIVE mg/dL   Specific Gravity, Urine 1.015  1.005 - 1.030   Hgb urine dipstick MODERATE (*) NEGATIVE   pH 7.0  5.0 - 8.0   Protein, ur NEGATIVE  NEGATIVE mg/dL   Urobilinogen, UA 0.2  0.0 - 1.0 mg/dL   Nitrite NEGATIVE  NEGATIVE   Leukocytes, UA SMALL (*) NEGATIVE     A urine culture was obtained.  Results are pending at this time and we will call about any positive results.  Assessment: The encounter diagnosis was UTI (lower urinary tract infection).   No evidence of pyelonephritis. She has had an intolerance to Cipro, I think she will tolerate Keflex well, but I told her to call if there were any reactions or rash.  Plan:   1.  The following meds were prescribed:   Discharge Medication List as of 12/30/2012  6:17 PM    START taking these medications   Details  cephALEXin (  KEFLEX) 500 MG capsule Take 1 capsule (500 mg total) by mouth 3 (three) times daily., Starting 12/30/2012, Until Discontinued, Normal    oxyCODONE-acetaminophen (PERCOCET) 5-325 MG per tablet 1 to 2 tablets every 6 hours as needed for pain., Print    phenazopyridine (PYRIDIUM) 200 MG tablet Take 1 tablet (200 mg total) by mouth 3 (three) times daily as needed for pain., Starting 12/30/2012, Until Discontinued, Normal       2.  The patient was instructed in symptomatic care and handouts were given. 3.  The patient was told to return if becoming worse in any way, if no better in 3 or 4 days, and given some red flag symptoms such as  any type of skin rash or reaction to the cephalexin, fever, vomiting, or increased abdominal pain that would indicate earlier return. 4.  The patient was told to avoid intercourse for 10 days, get extra fluids, and return for a follow up with her primary care doctor at the completion of treatment for a repeat UA and culture.     Reuben Likes, MD 12/30/12 [phone]

## 2012-12-30 NOTE — ED Notes (Signed)
Pt report uti symptoms. Urgency frequency and dysuria.  Pt was recently seen on 7/9 for same symptoms.  Pt states that she was unable to take cipro it made her feel bad. Symptoms have gradually gotten worse.

## 2013-01-02 LAB — URINE CULTURE

## 2013-01-02 NOTE — ED Notes (Signed)
Urine culture: >100,000 colonies E. Coli.   Pt. adequately treated with Keflex. Vassie Moselle 01/02/2013

## 2013-01-13 ENCOUNTER — Encounter (HOSPITAL_COMMUNITY): Payer: Self-pay | Admitting: *Deleted

## 2013-01-13 ENCOUNTER — Emergency Department (HOSPITAL_COMMUNITY)
Admission: EM | Admit: 2013-01-13 | Discharge: 2013-01-13 | Disposition: A | Discharge status: Home / Self Care | Payer: Self-pay | Attending: Emergency Medicine | Admitting: Emergency Medicine

## 2013-01-13 DIAGNOSIS — Z8679 Personal history of other diseases of the circulatory system: Secondary | ICD-10-CM | POA: Insufficient documentation

## 2013-01-13 DIAGNOSIS — J4489 Other specified chronic obstructive pulmonary disease: Secondary | ICD-10-CM | POA: Insufficient documentation

## 2013-01-13 DIAGNOSIS — R300 Dysuria: Secondary | ICD-10-CM | POA: Insufficient documentation

## 2013-01-13 DIAGNOSIS — Z79899 Other long term (current) drug therapy: Secondary | ICD-10-CM | POA: Insufficient documentation

## 2013-01-13 DIAGNOSIS — R350 Frequency of micturition: Secondary | ICD-10-CM | POA: Insufficient documentation

## 2013-01-13 DIAGNOSIS — Z8739 Personal history of other diseases of the musculoskeletal system and connective tissue: Secondary | ICD-10-CM | POA: Insufficient documentation

## 2013-01-13 DIAGNOSIS — N390 Urinary tract infection, site not specified: Secondary | ICD-10-CM | POA: Insufficient documentation

## 2013-01-13 DIAGNOSIS — R109 Unspecified abdominal pain: Secondary | ICD-10-CM | POA: Insufficient documentation

## 2013-01-13 DIAGNOSIS — J449 Chronic obstructive pulmonary disease, unspecified: Secondary | ICD-10-CM | POA: Insufficient documentation

## 2013-01-13 DIAGNOSIS — F17200 Nicotine dependence, unspecified, uncomplicated: Secondary | ICD-10-CM | POA: Insufficient documentation

## 2013-01-13 DIAGNOSIS — I10 Essential (primary) hypertension: Secondary | ICD-10-CM | POA: Insufficient documentation

## 2013-01-13 LAB — URINE MICROSCOPIC-ADD ON

## 2013-01-13 LAB — URINALYSIS, ROUTINE W REFLEX MICROSCOPIC
Glucose, UA: NEGATIVE mg/dL
Specific Gravity, Urine: 1.022 (ref 1.005–1.030)

## 2013-01-13 MED ORDER — DEXTROSE 5 % IV SOLN
1.0000 g | Freq: Once | INTRAVENOUS | Status: AC
  Administered 2013-01-13: 1 g via INTRAVENOUS
  Filled 2013-01-13: qty 10

## 2013-01-13 MED ORDER — HYDROCODONE-ACETAMINOPHEN 5-325 MG PO TABS: ORAL_TABLET | ORAL | Status: DC

## 2013-01-13 MED ORDER — NITROFURANTOIN MONOHYD MACRO 100 MG PO CAPS: 100.0000 mg | ORAL_CAPSULE | Freq: Two times a day (BID) | ORAL | Status: DC

## 2013-01-13 NOTE — ED Provider Notes (Signed)
CSN: [id number]     Arrival date & time 01/13/13  1517 History     First MD Initiated Contact with Patient 01/13/13 1529     Chief Complaint  Patient presents with  . Urinary Tract Infection  . Hematuria   (Consider location/radiation/quality/duration/timing/severity/associated sxs/prior Treatment) HPI Comments: Patient with history of urinary tract infection presents with complaint of dysuria, suprapubic pain, hematuria. Symptoms initially started 3-4 weeks ago. Patient was started on a course of ciprofloxacin which she could not tolerate. Symptoms continued and she was placed on Keflex on 07/19. Patient took this for 10 days as prescribed and noted relief of symptoms. Over the past 2 days the symptoms have returned become worse. She denies fever. Nausea but no vomiting. No diarrhea. Patient has some lower abdominal pain at baseline. The onset of this condition was acute. The course is constant. Aggravating factors: palpation, urination. Alleviating factors: none.    Patient is a 52 y.o. female presenting with urinary tract infection and hematuria. The history is provided by the patient and medical records.  Urinary Tract Infection Pertinent negatives include no abdominal pain, chest pain, coughing, fever, headaches, myalgias, nausea, rash, sore throat or vomiting.  Hematuria Pertinent negatives include no abdominal pain, chest pain, coughing, fever, headaches, myalgias, nausea, rash, sore throat or vomiting.    Past Medical History  Diagnosis Date  . Arthritis   . COPD (chronic obstructive pulmonary disease)   . Hypertension   . Generalized headaches   . Leg swelling   . Abdominal pain    Past Surgical History  Procedure Laterality Date  . Hernia repair  2010  . Colon surgery  2010    per patient "colon take down"   Family History  Problem Relation Age of Onset  . Heart disease Mother 32  . Heart failure Mother   . Asthma Mother   . Alzheimer's disease Father 31   History   Substance Use Topics  . Smoking status: Current Every Day Smoker -- 1.00 packs/day for 25 years  . Smokeless tobacco: Never Used  . Alcohol Use: No   OB History   Grav Para Term Preterm Abortions TAB SAB Ect Mult Living                 Review of Systems  Constitutional: Negative for fever.  HENT: Negative for sore throat and rhinorrhea.   Eyes: Negative for redness.  Respiratory: Negative for cough.   Cardiovascular: Negative for chest pain.  Gastrointestinal: Negative for nausea, vomiting, abdominal pain and diarrhea.  Genitourinary: Positive for dysuria, frequency and hematuria. Negative for flank pain, vaginal bleeding and vaginal discharge.  Musculoskeletal: Negative for myalgias.  Skin: Negative for rash.  Neurological: Negative for headaches.    Allergies  Ciprofloxacin; Aspirin; Codeine; and Penicillins  Home Medications   Current Outpatient Rx  Name  Route  Sig  Dispense  Refill  . hydrochlorothiazide (HYDRODIURIL) 25 MG tablet   Oral   Take 1 tablet (25 mg total) by mouth daily.   30 tablet   1   . ibuprofen (ADVIL,MOTRIN) 200 MG tablet   Oral   Take 400 mg by mouth 2 (two) times daily as needed for pain. For neck pain         . Multiple Vitamin (MULTIVITAMIN) tablet   Oral   Take 1 tablet by mouth every other day.          . phenazopyridine (PYRIDIUM) 200 MG tablet   Oral   Take 1 tablet (  200 mg total) by mouth 3 (three) times daily as needed for pain.   15 tablet   0   . cephALEXin (KEFLEX) 500 MG capsule   Oral   Take 1 capsule (500 mg total) by mouth 3 (three) times daily.   30 capsule   0    BP 106/71  Pulse 85  Temp(Src) 98.7 F (37.1 C) (Oral)  Resp 18  SpO2 99%  Physical Exam  Nursing note and vitals reviewed. Constitutional: She appears well-developed and well-nourished.  HENT:  Head: Normocephalic and atraumatic.  Eyes: Conjunctivae are normal. Right eye exhibits no discharge. Left eye exhibits no discharge.  Neck: Normal  range of motion. Neck supple.  Cardiovascular: Normal rate, regular rhythm and normal heart sounds.   Pulmonary/Chest: Effort normal and breath sounds normal.  Abdominal: Soft. She exhibits no distension. There is tenderness (suprapubic). There is no rebound, no guarding and no CVA tenderness.  Neurological: She is alert.  Skin: Skin is warm and dry.  Psychiatric: She has a normal mood and affect.    ED Course   Procedures (including critical care time)  Labs Reviewed  URINALYSIS, ROUTINE W REFLEX MICROSCOPIC - Abnormal; Notable for the following:    Color, Urine RED (*)    APPearance TURBID (*)    Hgb urine dipstick LARGE (*)    Bilirubin Urine MODERATE (*)    Ketones, ur 15 (*)    Protein, ur >300 (*)    Urobilinogen, UA 4.0 (*)    Nitrite POSITIVE (*)    Leukocytes, UA LARGE (*)    All other components within normal limits  URINE MICROSCOPIC-ADD ON   No results found. 1. Complicated UTI (urinary tract infection)     4:20 PM Patient seen and examined. Work-up initiated. Medications ordered.   Vital signs reviewed and are as follows: Filed Vitals:   01/13/13 1544  BP: 106/71  Pulse: 85  Temp: 98.7 F (37.1 C)  Resp: 18   6:23 PM Patient continues to do well. She has received Rocephin.   Patient urged to return with worsening symptoms or other concerns. Patient verbalized understanding and agrees with plan.   MDM  Complicated UTI -- failure of antibiotics. Unfortunately it does not appear that 10 day course of Keflex was enough to irradiate infection. No sx concerning for pyelo.  No CVA tenderness, vomiting, fever. Culture shows susceptibility to macrobid. Will give 14 day course. No concern for kidney stone given presentation.  Renne Crigler, PA-C 01/13/13 [phone]

## 2013-01-13 NOTE — ED Notes (Signed)
Pt reports having continued UTI symptoms and pain after completed antibiotics for a UTI 2 days ago.  Pt having left lower abdominal tenderness and lower back tenderness.  States she was urinating blood- currently taking Pyridium so pt is unsure if she still is.

## 2013-01-13 NOTE — ED Notes (Signed)
Pt reports having UTI, has been seen multiple times for it already. Finished keflex 3 days ago and now having left side abd pain and lower back pain, blood in urine this am.

## 2013-01-14 NOTE — ED Provider Notes (Signed)
  Medical screening examination/treatment/procedure(s) were performed by non-physician practitioner and as supervising physician I was immediately available for consultation/collaboration.    Gerhard Munch, MD 01/14/13 0001

## 2013-04-11 ENCOUNTER — Emergency Department (HOSPITAL_COMMUNITY): Payer: No Typology Code available for payment source

## 2013-04-11 ENCOUNTER — Encounter (HOSPITAL_COMMUNITY): Payer: Self-pay | Admitting: Emergency Medicine

## 2013-04-11 DIAGNOSIS — Z79899 Other long term (current) drug therapy: Secondary | ICD-10-CM | POA: Insufficient documentation

## 2013-04-11 DIAGNOSIS — J029 Acute pharyngitis, unspecified: Secondary | ICD-10-CM | POA: Insufficient documentation

## 2013-04-11 DIAGNOSIS — J4489 Other specified chronic obstructive pulmonary disease: Secondary | ICD-10-CM | POA: Insufficient documentation

## 2013-04-11 DIAGNOSIS — J449 Chronic obstructive pulmonary disease, unspecified: Secondary | ICD-10-CM | POA: Insufficient documentation

## 2013-04-11 DIAGNOSIS — N390 Urinary tract infection, site not specified: Secondary | ICD-10-CM | POA: Insufficient documentation

## 2013-04-11 DIAGNOSIS — F17200 Nicotine dependence, unspecified, uncomplicated: Secondary | ICD-10-CM | POA: Insufficient documentation

## 2013-04-11 DIAGNOSIS — Z880 Allergy status to penicillin: Secondary | ICD-10-CM | POA: Insufficient documentation

## 2013-04-11 DIAGNOSIS — Z8739 Personal history of other diseases of the musculoskeletal system and connective tissue: Secondary | ICD-10-CM | POA: Insufficient documentation

## 2013-04-11 DIAGNOSIS — I10 Essential (primary) hypertension: Secondary | ICD-10-CM | POA: Insufficient documentation

## 2013-04-11 DIAGNOSIS — J069 Acute upper respiratory infection, unspecified: Secondary | ICD-10-CM | POA: Insufficient documentation

## 2013-04-11 NOTE — ED Notes (Signed)
Pt c/o URI sx with productive cough with yellow sputum and body aches; pt sts some HA

## 2013-04-12 ENCOUNTER — Emergency Department (HOSPITAL_COMMUNITY)
Admission: EM | Admit: 2013-04-12 | Discharge: 2013-04-12 | Disposition: A | Discharge status: Home / Self Care | Payer: No Typology Code available for payment source | Attending: Emergency Medicine | Admitting: Emergency Medicine

## 2013-04-12 DIAGNOSIS — J069 Acute upper respiratory infection, unspecified: Secondary | ICD-10-CM

## 2013-04-12 DIAGNOSIS — J209 Acute bronchitis, unspecified: Secondary | ICD-10-CM

## 2013-04-12 DIAGNOSIS — N390 Urinary tract infection, site not specified: Secondary | ICD-10-CM

## 2013-04-12 LAB — URINALYSIS, ROUTINE W REFLEX MICROSCOPIC
Glucose, UA: NEGATIVE mg/dL
Leukocytes, UA: NEGATIVE
Nitrite: NEGATIVE
Protein, ur: NEGATIVE mg/dL
Specific Gravity, Urine: 1.016 (ref 1.005–1.030)
Urobilinogen, UA: 0.2 mg/dL (ref 0.0–1.0)

## 2013-04-12 MED ORDER — PREDNISONE 20 MG PO TABS
60.0000 mg | ORAL_TABLET | Freq: Once | ORAL | Status: AC
  Administered 2013-04-12: 60 mg via ORAL
  Filled 2013-04-12: qty 3

## 2013-04-12 MED ORDER — AZITHROMYCIN 250 MG PO TABS: 250.0000 mg | ORAL_TABLET | Freq: Every day | ORAL | Status: DC

## 2013-04-12 MED ORDER — CEPHALEXIN 250 MG PO CAPS
1000.0000 mg | ORAL_CAPSULE | Freq: Once | ORAL | Status: AC
  Administered 2013-04-12: 1000 mg via ORAL
  Filled 2013-04-12: qty 4

## 2013-04-12 MED ORDER — ALBUTEROL SULFATE HFA 108 (90 BASE) MCG/ACT IN AERS
2.0000 | INHALATION_SPRAY | RESPIRATORY_TRACT | Status: DC | PRN
  Filled 2013-04-12: qty 6.7

## 2013-04-12 MED ORDER — HYDROCHLOROTHIAZIDE 25 MG PO TABS: 25.0000 mg | ORAL_TABLET | Freq: Every day | ORAL | Status: DC

## 2013-04-12 MED ORDER — CEPHALEXIN 500 MG PO CAPS: 500.0000 mg | ORAL_CAPSULE | Freq: Four times a day (QID) | ORAL | Status: DC

## 2013-04-12 MED ORDER — TRAMADOL HCL 50 MG PO TABS
50.0000 mg | ORAL_TABLET | Freq: Once | ORAL | Status: AC
  Administered 2013-04-12: 50 mg via ORAL
  Filled 2013-04-12: qty 1

## 2013-04-12 MED ORDER — PREDNISONE 20 MG PO TABS: 60.0000 mg | ORAL_TABLET | Freq: Every day | ORAL | Status: DC

## 2013-04-12 NOTE — ED Provider Notes (Signed)
CSN: [id number]     Arrival date & time 04/11/13  1833 History   First MD Initiated Contact with Patient 04/12/13 0203     Chief Complaint  Patient presents with  . URI  . Generalized Body Aches  . Cough   (Consider location/radiation/quality/duration/timing/severity/associated sxs/prior Treatment) Patient is a 52 y.o. female presenting with URI and cough. The history is provided by the patient.  URI Presenting symptoms: cough   Cough She complains of a cough for the last 3 days. Cough is productive of some thick yellowish sputum. There is associated nasal stuffiness and sore throat and generalized bodyaches. She denies dyspnea, nausea, vomiting, diarrhea. She's not had any fever or chills. She has taken ibuprofen which does give her relief from her bodyaches. Of note, she is a cigarette smoker. She denies sick contacts. She denies travel to [place] or exposure to people who have traveled to [place]. She is also requesting a refill of her blood pressure medication. She states she ran out 3 weeks ago and does not have a PCP.  Past Medical History  Diagnosis Date  . Arthritis   . COPD (chronic obstructive pulmonary disease)   . Hypertension   . Generalized headaches   . Leg swelling   . Abdominal pain    Past Surgical History  Procedure Laterality Date  . Hernia repair  2010  . Colon surgery  2010    per patient "colon take down"   Family History  Problem Relation Age of Onset  . Heart disease Mother 14  . Heart failure Mother   . Asthma Mother   . Alzheimer's disease Father 85   History  Substance Use Topics  . Smoking status: Current Every Day Smoker -- 1.00 packs/day for 25 years  . Smokeless tobacco: Never Used  . Alcohol Use: No   OB History   Grav Para Term Preterm Abortions TAB SAB Ect Mult Living                 Review of Systems  Respiratory: Positive for cough.   All other systems reviewed and are negative.    Allergies  Ciprofloxacin; Aspirin;  Codeine; and Penicillins  Home Medications   Current Outpatient Rx  Name  Route  Sig  Dispense  Refill  . hydrochlorothiazide (HYDRODIURIL) 25 MG tablet   Oral   Take 1 tablet (25 mg total) by mouth daily.   30 tablet   1   . ibuprofen (ADVIL,MOTRIN) 200 MG tablet   Oral   Take 400 mg by mouth 2 (two) times daily as needed for pain. For neck pain          BP 138/79  Pulse 67  Temp(Src) 97.9 F (36.6 C) (Oral)  Resp 14  SpO2 99% Physical Exam  Nursing note and vitals reviewed.  52 year old female, resting comfortably and in no acute distress. Vital signs are significant for hypertension with blood pressure 160/81. Oxygen saturation is 98%, which is normal. Head is normocephalic and atraumatic. PERRLA, EOMI. Oropharynx is clear. There is mild tenderness to palpation over frontal and maxillary sinuses. Neck is nontender and supple without adenopathy or JVD. Back is nontender and there is no CVA tenderness. Lungs have faint expiratory wheezes noted with forced exhalation. There are no rales or rhonchi. Chest is nontender. Heart has regular rate and rhythm without murmur. Abdomen is soft, flat, nontender without masses or hepatosplenomegaly and peristalsis is normoactive. Extremities have no cyanosis or edema, full range  of motion is present. Skin is warm and dry without rash. Neurologic: Mental status is normal, cranial nerves are intact, there are no motor or sensory deficits.  ED Course  Procedures (including critical care time) Labs Review Results for orders placed during the hospital encounter of 01/13/13  URINALYSIS, ROUTINE W REFLEX MICROSCOPIC      Result Value Range   Color, Urine RED (*) YELLOW   APPearance TURBID (*) CLEAR   Specific Gravity, Urine 1.022  1.005 - 1.030   pH 5.0  5.0 - 8.0   Glucose, UA NEGATIVE  NEGATIVE mg/dL   Hgb urine dipstick LARGE (*) NEGATIVE   Bilirubin Urine MODERATE (*) NEGATIVE   Ketones, ur 15 (*) NEGATIVE mg/dL   Protein, ur  >454 (*) NEGATIVE mg/dL   Urobilinogen, UA 4.0 (*) 0.0 - 1.0 mg/dL   Nitrite POSITIVE (*) NEGATIVE   Leukocytes, UA LARGE (*) NEGATIVE  URINE MICROSCOPIC-ADD ON      Result Value Range   WBC, UA 11-20  <3 WBC/hpf   RBC / HPF 21-50  <3 RBC/hpf   Bacteria, UA RARE  RARE   Imaging Review Dg Chest 2 View  04/11/2013   CLINICAL DATA:  Upper respiratory symptoms with body aches and cough for 3 days. Smoker.  EXAM: CHEST  2 VIEW  COMPARISON:  10/20/2012.  FINDINGS: The heart size and mediastinal contours are stable. There is aortic atherosclerosis. Linear scarring or atelectasis at the left lung base is unchanged. There is no confluent airspace opacity, pleural effusion or pneumothorax .  IMPRESSION: Stable chest. No acute cardiopulmonary process.   Electronically Signed   By: Roxy Horseman M.D.   On: 04/11/2013 19:47     Date: 04/12/2013  Rate: 69  Rhythm: normal sinus rhythm  QRS Axis: normal  Intervals: QT prolonged  ST/T Wave abnormalities: normal  Conduction Disutrbances:none  Narrative Interpretation: Possible old anteroseptal myocardial infarction. Prolonged QT interval. No prior ECG available for comparison.  Old EKG Reviewed: none available   MDM   1. Acute bronchitis   2. Upper respiratory infection   3. Urinary tract infection    Acute bronchitis. She's given albuterol inhaler as well as a short course of prednisone and refill is given for her hydrochlorothiazide. Note is made of a UTI which is asymptomatic. She is given a prescription for cephalexin.    Dione Booze, MD 04/12/13 [phone]

## 2013-04-12 NOTE — ED Notes (Signed)
Pt. Family member came out to nurses station stating "We've been here for 8 hours and still haven't been seen. We asked to talk to the supervisor 3 hours ago and no one has come by.  We are about to go to a different hospital and say you wouldn't treat her. She is here for the same reason other people are so what makes them get to be seen before her?"  Charge nurse notified.

## 2013-05-08 ENCOUNTER — Ambulatory Visit: Payer: No Typology Code available for payment source | Attending: Internal Medicine

## 2013-05-09 ENCOUNTER — Encounter: Payer: Self-pay | Admitting: Internal Medicine

## 2013-05-09 ENCOUNTER — Ambulatory Visit: Payer: No Typology Code available for payment source | Attending: Internal Medicine | Admitting: Internal Medicine

## 2013-05-09 VITALS — BP 134/86 | HR 98 | Temp 97.9°F | Resp 16 | Ht 64.0 in | Wt 166.4 lb

## 2013-05-09 DIAGNOSIS — R42 Dizziness and giddiness: Secondary | ICD-10-CM | POA: Insufficient documentation

## 2013-05-09 DIAGNOSIS — K469 Unspecified abdominal hernia without obstruction or gangrene: Secondary | ICD-10-CM

## 2013-05-09 DIAGNOSIS — F17200 Nicotine dependence, unspecified, uncomplicated: Secondary | ICD-10-CM | POA: Insufficient documentation

## 2013-05-09 DIAGNOSIS — Z139 Encounter for screening, unspecified: Secondary | ICD-10-CM

## 2013-05-09 DIAGNOSIS — Z760 Encounter for issue of repeat prescription: Secondary | ICD-10-CM

## 2013-05-09 DIAGNOSIS — I10 Essential (primary) hypertension: Secondary | ICD-10-CM

## 2013-05-09 LAB — CBC WITH DIFFERENTIAL/PLATELET
Basophils Absolute: 0 10*3/uL (ref 0.0–0.1)
Eosinophils Absolute: 0.3 10*3/uL (ref 0.0–0.7)
Eosinophils Relative: 4 % (ref 0–5)
HCT: 45.6 % (ref 36.0–46.0)
Hemoglobin: 15.5 g/dL — ABNORMAL HIGH (ref 12.0–15.0)
Lymphocytes Relative: 37 % (ref 12–46)
Lymphs Abs: 3.1 10*3/uL (ref 0.7–4.0)
MCH: 28.9 pg (ref 26.0–34.0)
MCV: 85.1 fL (ref 78.0–100.0)
Monocytes Relative: 8 % (ref 3–12)
Neutrophils Relative %: 50 % (ref 43–77)
Platelets: 368 10*3/uL (ref 150–400)
RBC: 5.36 MIL/uL — ABNORMAL HIGH (ref 3.87–5.11)
RDW: 14.5 % (ref 11.5–15.5)
WBC: 8.5 10*3/uL (ref 4.0–10.5)

## 2013-05-09 LAB — COMPLETE METABOLIC PANEL WITH GFR
ALT: 14 U/L (ref 0–35)
AST: 16 U/L (ref 0–37)
Alkaline Phosphatase: 77 U/L (ref 39–117)
BUN: 7 mg/dL (ref 6–23)
Calcium: 9.9 mg/dL (ref 8.4–10.5)
Chloride: 103 mEq/L (ref 96–112)
Creat: 0.69 mg/dL (ref 0.50–1.10)
Sodium: 139 mEq/L (ref 135–145)
Total Bilirubin: 0.4 mg/dL (ref 0.3–1.2)
Total Protein: 7.4 g/dL (ref 6.0–8.3)

## 2013-05-09 LAB — LIPID PANEL
Cholesterol: 221 mg/dL — ABNORMAL HIGH (ref 0–200)
HDL: 47 mg/dL (ref >39)
LDL Cholesterol: 142 mg/dL — ABNORMAL HIGH (ref 0–99)
Total CHOL/HDL Ratio: 4.7 Ratio
Triglycerides: 158 mg/dL — ABNORMAL HIGH (ref <150)
VLDL: 32 mg/dL (ref 0–40)

## 2013-05-09 LAB — TSH: TSH: 1.089 u[IU]/mL (ref 0.350–4.500)

## 2013-05-09 MED ORDER — MECLIZINE HCL 25 MG PO TABS: 25.0000 mg | ORAL_TABLET | Freq: Three times a day (TID) | ORAL | Status: DC | PRN

## 2013-05-09 MED ORDER — ALBUTEROL SULFATE HFA 108 (90 BASE) MCG/ACT IN AERS: 2.0000 | INHALATION_SPRAY | Freq: Four times a day (QID) | RESPIRATORY_TRACT | Status: DC | PRN

## 2013-05-09 MED ORDER — HYDROCHLOROTHIAZIDE 25 MG PO TABS: 25.0000 mg | ORAL_TABLET | Freq: Every day | ORAL | Status: DC

## 2013-05-09 NOTE — Patient Instructions (Signed)
Smoking Cessation Quitting smoking is important to your health and has many advantages. However, it is not always easy to quit since nicotine is a very addictive drug. Often times, people try 3 times or more before being able to quit. This document explains the best ways for you to prepare to quit smoking. Quitting takes hard work and a lot of effort, but you can do it. ADVANTAGES OF QUITTING SMOKING  You will live longer, feel better, and live better.  Your body will feel the impact of quitting smoking almost immediately.  Within 20 minutes, blood pressure decreases. Your pulse returns to its normal level.  After 8 hours, carbon monoxide levels in the blood return to normal. Your oxygen level increases.  After 24 hours, the chance of having a heart attack starts to decrease. Your breath, hair, and body stop smelling like smoke.  After 48 hours, damaged nerve endings begin to recover. Your sense of taste and smell improve.  After 72 hours, the body is virtually free of nicotine. Your bronchial tubes relax and breathing becomes easier.  After 2 to 12 weeks, lungs can hold more air. Exercise becomes easier and circulation improves.  The risk of having a heart attack, stroke, cancer, or lung disease is greatly reduced.  After 1 year, the risk of coronary heart disease is cut in half.  After 5 years, the risk of stroke falls to the same as a nonsmoker.  After 10 years, the risk of lung cancer is cut in half and the risk of other cancers decreases significantly.  After 15 years, the risk of coronary heart disease drops, usually to the level of a nonsmoker.  If you are pregnant, quitting smoking will improve your chances of having a healthy baby.  The people you live with, especially any children, will be healthier.  You will have extra money to spend on things other than cigarettes. QUESTIONS TO THINK ABOUT BEFORE ATTEMPTING TO QUIT You may want to talk about your answers with your  caregiver.  Why do you want to quit?  If you tried to quit in the past, what helped and what did not?  What will be the most difficult situations for you after you quit? How will you plan to handle them?  Who can help you through the tough times? Your family? Friends? A caregiver?  What pleasures do you get from smoking? What ways can you still get pleasure if you quit? Here are some questions to ask your caregiver:  How can you help me to be successful at quitting?  What medicine do you think would be best for me and how should I take it?  What should I do if I need more help?  What is smoking withdrawal like? How can I get information on withdrawal? GET READY  Set a quit date.  Change your environment by getting rid of all cigarettes, ashtrays, matches, and lighters in your home, car, or work. Do not let people smoke in your home.  Review your past attempts to quit. Think about what worked and what did not. GET SUPPORT AND ENCOURAGEMENT You have a better chance of being successful if you have help. You can get support in many ways.  Tell your family, friends, and co-workers that you are going to quit and need their support. Ask them not to smoke around you.  Get individual, group, or telephone counseling and support. Programs are available at local hospitals and health centers. Call your local health department for   information about programs in your area.  Spiritual beliefs and practices may help some smokers quit.  Download a "quit meter" on your computer to keep track of quit statistics, such as how long you have gone without smoking, cigarettes not smoked, and money saved.  Get a self-help book about quitting smoking and staying off of tobacco. LEARN NEW SKILLS AND BEHAVIORS  Distract yourself from urges to smoke. Talk to someone, go for a walk, or occupy your time with a task.  Change your normal routine. Take a different route to work. Drink tea instead of coffee.  Eat breakfast in a different place.  Reduce your stress. Take a hot bath, exercise, or read a book.  Plan something enjoyable to do every day. Reward yourself for not smoking.  Explore interactive web-based programs that specialize in helping you quit. GET MEDICINE AND USE IT CORRECTLY Medicines can help you stop smoking and decrease the urge to smoke. Combining medicine with the above behavioral methods and support can greatly increase your chances of successfully quitting smoking.  Nicotine replacement therapy helps deliver nicotine to your body without the negative effects and risks of smoking. Nicotine replacement therapy includes nicotine gum, lozenges, inhalers, nasal sprays, and skin patches. Some may be available over-the-counter and others require a prescription.  Antidepressant medicine helps people abstain from smoking, but how this works is unknown. This medicine is available by prescription.  Nicotinic receptor partial agonist medicine simulates the effect of nicotine in your brain. This medicine is available by prescription. Ask your caregiver for advice about which medicines to use and how to use them based on your health history. Your caregiver will tell you what side effects to look out for if you choose to be on a medicine or therapy. Carefully read the information on the package. Do not use any other product containing nicotine while using a nicotine replacement product.  RELAPSE OR DIFFICULT SITUATIONS Most relapses occur within the first 3 months after quitting. Do not be discouraged if you start smoking again. Remember, most people try several times before finally quitting. You may have symptoms of withdrawal because your body is used to nicotine. You may crave cigarettes, be irritable, feel very hungry, cough often, get headaches, or have difficulty concentrating. The withdrawal symptoms are only temporary. They are strongest when you first quit, but they will go away within  10 14 days. To reduce the chances of relapse, try to:  Avoid drinking alcohol. Drinking lowers your chances of successfully quitting.  Reduce the amount of caffeine you consume. Once you quit smoking, the amount of caffeine in your body increases and can give you symptoms, such as a rapid heartbeat, sweating, and anxiety.  Avoid smokers because they can make you want to smoke.  Do not let weight gain distract you. Many smokers will gain weight when they quit, usually less than 10 pounds. Eat a healthy diet and stay active. You can always lose the weight gained after you quit.  Find ways to improve your mood other than smoking. FOR MORE INFORMATION  www.smokefree.gov  Document Released: 05/25/2001 Document Revised: 11/30/2011 Document Reviewed: 09/09/2011 [employer] Patient Information 2014 [employer]. Hypertension As your heart beats, it forces blood through your arteries. This force is your blood pressure. If the pressure is too high, it is called hypertension (HTN) or high blood pressure. HTN is dangerous because you may have it and not know it. High blood pressure may mean that your heart has to work harder to pump   blood. Your arteries may be narrow or stiff. The extra work puts you at risk for heart disease, stroke, and other problems.  Blood pressure consists of two numbers, a higher number over a lower, 110/72, for example. It is stated as "110 over 72." The ideal is below 120 for the top number (systolic) and under 80 for the bottom (diastolic). Write down your blood pressure today. You should pay close attention to your blood pressure if you have certain conditions such as:  Heart failure.  Prior heart attack.  Diabetes  Chronic kidney disease.  Prior stroke.  Multiple risk factors for heart disease. To see if you have HTN, your blood pressure should be measured while you are seated with your arm held at the level of the heart. It should be measured at least twice. A  one-time elevated blood pressure reading (especially in the Emergency Department) does not mean that you need treatment. There may be conditions in which the blood pressure is different between your right and left arms. It is important to see your caregiver soon for a recheck. Most people have essential hypertension which means that there is not a specific cause. This type of high blood pressure may be lowered by changing lifestyle factors such as:  Stress.  Smoking.  Lack of exercise.  Excessive weight.  Drug/tobacco/alcohol use.  Eating less salt. Most people do not have symptoms from high blood pressure until it has caused damage to the body. Effective treatment can often prevent, delay or reduce that damage. TREATMENT  When a cause has been identified, treatment for high blood pressure is directed at the cause. There are a large number of medications to treat HTN. These fall into several categories, and your caregiver will help you select the medicines that are best for you. Medications may have side effects. You should review side effects with your caregiver. If your blood pressure stays high after you have made lifestyle changes or started on medicines,   Your medication(s) may need to be changed.  Other problems may need to be addressed.  Be certain you understand your prescriptions, and know how and when to take your medicine.  Be sure to follow up with your caregiver within the time frame advised (usually within two weeks) to have your blood pressure rechecked and to review your medications.  If you are taking more than one medicine to lower your blood pressure, make sure you know how and at what times they should be taken. Taking two medicines at the same time can result in blood pressure that is too low. SEEK IMMEDIATE MEDICAL CARE IF:  You develop a severe headache, blurred or changing vision, or confusion.  You have unusual weakness or numbness, or a faint feeling.  You  have severe chest or abdominal pain, vomiting, or breathing problems. MAKE SURE YOU:   Understand these instructions.  Will watch your condition.  Will get help right away if you are not doing well or get worse. Document Released: 05/31/2005 Document Revised: 08/23/2011 Document Reviewed: 01/19/2008 Exit[employer] Patient Information 2014 [employer].  

## 2013-05-09 NOTE — Progress Notes (Signed)
Patient here to establish care Complains of feeling nervous panic attacks  HTN Copd Arthritis incontinent Lately gets dizzy when she lays on her left side Feels fatiqued History of UTI

## 2013-05-09 NOTE — Progress Notes (Signed)
MRN: [id number] Name: Maria Oliver  Sex: female Age: 52 y.o. DOB: 08-Apr-1961  Allergies: Ciprofloxacin; Aspirin; Codeine; and Penicillins  Chief Complaint  Patient presents with  . Establish Care    HPI: Patient is 52 y.o. female who comes for the first time to establish medical care, she is history of hypertension bronchitis, currently smokes cigarettes everyday not ready to quit also use Ultram when necessary, she is requesting refill on the medications. She also reported to have occasional dizziness which is like a spinning sensation especially when she turns her head to the left while sleeping on denies any ear pain discharge, denies any chest. Patient also has abdominal hernia for the last few years as per patient it is now increasing in the size .  Past Medical History  Diagnosis Date  . Arthritis   . COPD (chronic obstructive pulmonary disease)   . Hypertension   . Generalized headaches   . Leg swelling   . Abdominal pain   . Anxiety     Past Surgical History  Procedure Laterality Date  . Hernia repair  2010  . Colon surgery  2010    per patient "colon take down"      Medication List       This list is accurate as of: 05/09/13  2:43 PM.  Always use your most recent med list.               albuterol 108 (90 BASE) MCG/ACT inhaler  Commonly known as:  PROVENTIL HFA;VENTOLIN HFA  Inhale 2 puffs into the lungs every 6 (six) hours as needed for wheezing or shortness of breath.     cephALEXin 500 MG capsule  Commonly known as:  KEFLEX  Take 1 capsule (500 mg total) by mouth 4 (four) times daily.     hydrochlorothiazide 25 MG tablet  Commonly known as:  HYDRODIURIL  Take 1 tablet (25 mg total) by mouth daily.     ibuprofen 200 MG tablet  Commonly known as:  ADVIL,MOTRIN  Take 400 mg by mouth 2 (two) times daily as needed for pain. For neck pain     meclizine 25 MG tablet  Commonly known as:  ANTIVERT  Take 1 tablet (25 mg total) by mouth 3 (three) times  daily as needed for nausea.     predniSONE 20 MG tablet  Commonly known as:  DELTASONE  Take 3 tablets (60 mg total) by mouth daily.        Meds ordered this encounter  Medications  . DISCONTD: albuterol (PROVENTIL HFA;VENTOLIN HFA) 108 (90 BASE) MCG/ACT inhaler    Sig: Inhale into the lungs every 6 (six) hours as needed for wheezing or shortness of breath.  Marland Kitchen albuterol (PROVENTIL HFA;VENTOLIN HFA) 108 (90 BASE) MCG/ACT inhaler    Sig: Inhale 2 puffs into the lungs every 6 (six) hours as needed for wheezing or shortness of breath.    Dispense:  1 Inhaler    Refill:  2  . hydrochlorothiazide (HYDRODIURIL) 25 MG tablet    Sig: Take 1 tablet (25 mg total) by mouth daily.    Dispense:  30 tablet    Refill:  1    Order Specific Question:  Supervising Provider    Answer:  Eber Hong D [[id number]]  . meclizine (ANTIVERT) 25 MG tablet    Sig: Take 1 tablet (25 mg total) by mouth 3 (three) times daily as needed for nausea.    Dispense:  30 tablet    Refill:  1     There is no immunization history on file for this patient.  History  Substance Use Topics  . Smoking status: Current Every Day Smoker -- 1.00 packs/day for 40 years  . Smokeless tobacco: Never Used  . Alcohol Use: No    Review of Systems  As noted in HPI  Filed Vitals:   05/09/13 1409  BP: 134/86  Pulse: 98  Temp: 97.9 F (36.6 C)  Resp: 16    Physical Exam  Physical Exam  Constitutional: She is oriented to person, place, and time. No distress.  Eyes: EOM are normal. Pupils are equal, round, and reactive to light.  Cardiovascular: Normal rate and regular rhythm.   Pulmonary/Chest: Breath sounds normal. No respiratory distress. She has no wheezes. She has no rales.  Abdominal: Bowel sounds are normal.  Right lower abdominal hernia, increases with cough non tender   Musculoskeletal: She exhibits no edema.  Neurological: She is alert and oriented to person, place, and time.    CBC    Component Value  Date/Time   WBC 13.8* 10/20/2012 1513   RBC 4.96 10/20/2012 1513   HGB 15.0 10/20/2012 1513   HCT 41.8 10/20/2012 1513   PLT 317 10/20/2012 1513   MCV 84.3 10/20/2012 1513   LYMPHSABS 2.5 10/20/2012 1513   MONOABS 1.0 10/20/2012 1513   EOSABS 0.3 10/20/2012 1513   BASOSABS 0.0 10/20/2012 1513    CMP     Component Value Date/Time   NA 142 10/20/2012 1513   K 3.6 10/20/2012 1513   CL 103 10/20/2012 1513   CO2 28 10/20/2012 1513   GLUCOSE 98 10/20/2012 1513   BUN 8 10/20/2012 1513   CREATININE 0.70 10/20/2012 1513   CALCIUM 9.4 10/20/2012 1513   PROT 7.0 10/20/2012 1513   ALBUMIN 4.0 10/20/2012 1513   AST 17 10/20/2012 1513   ALT 15 10/20/2012 1513   ALKPHOS 79 10/20/2012 1513   BILITOT 0.2* 10/20/2012 1513   GFRNONAA >90 10/20/2012 1513   GFRAA >90 10/20/2012 1513    Lab Results  Component Value Date/Time   CHOL  Value: 103        ATP III CLASSIFICATION:  <200     mg/dL   Desirable  962-952  mg/dL   Borderline High  >=841    mg/dL   High        09/04/4008  4:45 AM    No components found with this basename: hga1c    Lab Results  Component Value Date/Time   AST 17 10/20/2012  3:13 PM    Assessment and Plan  HYPERTENSION, UNSPECIFIED - Plan: Continue with hydrochlorothiazide (HYDRODIURIL) 25 MG tablet  SMOKER... patient is counseled to quit smoking, she's not ready yet  Abdominal hernia - Plan: Ambulatory referral to General Surgery  Dizziness.. Will try meclizine when necessary  Medication refill - Plan: albuterol (PROVENTIL HFA;VENTOLIN HFA) 108 (90 BASE) MCG/ACT inhaler  Screening - Plan: CBC with Differential, COMPLETE METABOLIC PANEL WITH GFR, Lipid panel, TSH, Vit D  25 hydroxy (rtn osteoporosis monitoring)    Return in about 6 weeks (around 06/20/2013).  [name], MD

## 2013-06-19 ENCOUNTER — Ambulatory Visit: Payer: No Typology Code available for payment source | Attending: Internal Medicine | Admitting: Internal Medicine

## 2013-06-19 ENCOUNTER — Encounter: Payer: Self-pay | Admitting: Internal Medicine

## 2013-06-19 VITALS — BP 136/90 | HR 85 | Temp 98.0°F | Resp 16 | Wt 165.8 lb

## 2013-06-19 DIAGNOSIS — J029 Acute pharyngitis, unspecified: Secondary | ICD-10-CM

## 2013-06-19 DIAGNOSIS — F17200 Nicotine dependence, unspecified, uncomplicated: Secondary | ICD-10-CM

## 2013-06-19 DIAGNOSIS — R0981 Nasal congestion: Secondary | ICD-10-CM

## 2013-06-19 DIAGNOSIS — M549 Dorsalgia, unspecified: Secondary | ICD-10-CM

## 2013-06-19 DIAGNOSIS — J329 Chronic sinusitis, unspecified: Secondary | ICD-10-CM

## 2013-06-19 DIAGNOSIS — J3489 Other specified disorders of nose and nasal sinuses: Secondary | ICD-10-CM

## 2013-06-19 LAB — POCT RAPID STREP A (OFFICE): Rapid Strep A Screen: NEGATIVE

## 2013-06-19 LAB — POCT URINALYSIS DIPSTICK
Bilirubin, UA: NEGATIVE
Glucose, UA: NEGATIVE
Ketones, UA: NEGATIVE
LEUKOCYTES UA: NEGATIVE
NITRITE UA: NEGATIVE
PROTEIN UA: 30
Spec Grav, UA: 1.025
UROBILINOGEN UA: 0.2
pH, UA: 5.5

## 2013-06-19 MED ORDER — FLUTICASONE PROPIONATE 50 MCG/ACT NA SUSP: 2.0000 | Freq: Every day | NASAL | Status: DC

## 2013-06-19 MED ORDER — SULFAMETHOXAZOLE-TMP DS 800-160 MG PO TABS: 1.0000 | ORAL_TABLET | Freq: Two times a day (BID) | ORAL | Status: DC

## 2013-06-19 NOTE — Progress Notes (Signed)
Patient complains of congestion Sore throat Chills-hot and cold Increased phlegm in throat Hands shake all the time Heart races in the am-anxiety Lower flank back pain

## 2013-06-19 NOTE — Progress Notes (Signed)
   Subjective:    Patient ID: Maria Oliver, female    DOB: Oct 17, 1960, 53 y.o.   MRN: [id number]  53 year old female comes for the complaint of sinus pressure postnasal drip ear fullness dizziness coughing productive whitish to yellowish in color sore throat for the last for 5 days also reported to have some fever and chills denies any nausea vomiting reported to have lower flank pain denies any urinary frequency or dysuria, denies any chest pain or shortness of breath. Patient does smoke cigarettes everyday advised to quit smoking.   Sore Throat  Associated symptoms include coughing and ear pain.  Sinusitis Associated symptoms include chills, coughing and ear pain.      Review of Systems  Constitutional: Positive for chills.  HENT: Positive for ear pain, postnasal drip and rhinorrhea.   Respiratory: Positive for cough.   Genitourinary: Positive for flank pain.  Neurological: Positive for dizziness.       Objective:   Physical Exam  HENT:  Nasal congestion sinus tenderness, pharyngeal erythema no exudate, bilateral TMs visualized not congested  Cardiovascular: Normal rate and regular rhythm.   Pulmonary/Chest: Breath sounds normal. No respiratory distress. She has no wheezes. She has no rales.  Abdominal: Soft. There is no tenderness. There is no rebound.  No CVA tenderness          Assessment & Plan:  Sinusitis - Plan: sulfamethoxazole-trimethoprim (BACTRIM DS) 800-160 MG per tablet  Sore throat - Plan: Rapid Strep A is negative for will send Throat culture Loney Loh([facility])  Stuffy nose - Plan: fluticasone (FLONASE) 50 MCG/ACT nasal spray  Back pain - Plan: Urinalysis Dipstick negative for nitrite or leukocyte esterase  Smoking .. advised patient to quit smoking  Advised patient for salt water gargles, Tylenol when necessary.  Return if symptoms worsen or fail to improve.

## 2013-06-21 LAB — CULTURE, GROUP A STREP: ORGANISM ID, BACTERIA: NORMAL

## 2013-07-26 ENCOUNTER — Emergency Department (HOSPITAL_COMMUNITY)
Admission: EM | Admit: 2013-07-26 | Discharge: 2013-07-26 | Disposition: A | Discharge status: Home / Self Care | Payer: No Typology Code available for payment source | Source: Home / Self Care

## 2013-07-26 ENCOUNTER — Encounter (HOSPITAL_COMMUNITY): Payer: Self-pay | Admitting: Emergency Medicine

## 2013-07-26 DIAGNOSIS — M940 Chondrocostal junction syndrome [Tietze]: Secondary | ICD-10-CM

## 2013-07-26 DIAGNOSIS — R0789 Other chest pain: Secondary | ICD-10-CM

## 2013-07-26 DIAGNOSIS — R42 Dizziness and giddiness: Secondary | ICD-10-CM

## 2013-07-26 DIAGNOSIS — Z720 Tobacco use: Secondary | ICD-10-CM

## 2013-07-26 DIAGNOSIS — H8301 Labyrinthitis, right ear: Secondary | ICD-10-CM

## 2013-07-26 DIAGNOSIS — H8309 Labyrinthitis, unspecified ear: Secondary | ICD-10-CM

## 2013-07-26 DIAGNOSIS — S139XXA Sprain of joints and ligaments of unspecified parts of neck, initial encounter: Secondary | ICD-10-CM

## 2013-07-26 DIAGNOSIS — F17200 Nicotine dependence, unspecified, uncomplicated: Secondary | ICD-10-CM

## 2013-07-26 DIAGNOSIS — R071 Chest pain on breathing: Secondary | ICD-10-CM

## 2013-07-26 DIAGNOSIS — S161XXA Strain of muscle, fascia and tendon at neck level, initial encounter: Secondary | ICD-10-CM

## 2013-07-26 MED ORDER — DICLOFENAC POTASSIUM 50 MG PO TABS: 50.0000 mg | ORAL_TABLET | Freq: Three times a day (TID) | ORAL | Status: DC

## 2013-07-26 MED ORDER — MECLIZINE HCL 25 MG PO TABS: 25.0000 mg | ORAL_TABLET | Freq: Three times a day (TID) | ORAL | Status: DC | PRN

## 2013-07-26 MED ORDER — TRAMADOL HCL 50 MG PO TABS: 50.0000 mg | ORAL_TABLET | Freq: Four times a day (QID) | ORAL | Status: DC | PRN

## 2013-07-26 NOTE — ED Provider Notes (Signed)
CSN: [id number]     Arrival date & time 07/26/13  1529 History   First MD Initiated Contact with Patient 07/26/13 1634     Chief Complaint  Patient presents with  . Dizziness     (Consider location/radiation/quality/duration/timing/severity/associated sxs/prior Treatment) HPI Comments: 53 year old female who appears 15 years older than stated age presents with dizziness for one day. It is exacerbated by movement such as rotating the head, bending forward, movement and ambulation. She is also complaining of pain in the neck, shoulder and left collar bone. She denies anterior chest heaviness tightness fullness or pressure. She states that she has chronic mild shortness of breath due to her COPD.   Past Medical History  Diagnosis Date  . Arthritis   . COPD (chronic obstructive pulmonary disease)   . Hypertension   . Generalized headaches   . Leg swelling   . Abdominal pain   . Anxiety    Past Surgical History  Procedure Laterality Date  . Hernia repair  2010  . Colon surgery  2010    per patient "colon take down"   Family History  Problem Relation Age of Onset  . Heart disease Mother 82  . Heart failure Mother   . Asthma Mother   . Alzheimer's disease Father 1   History  Substance Use Topics  . Smoking status: Current Every Day Smoker -- 1.00 packs/day for 40 years  . Smokeless tobacco: Never Used  . Alcohol Use: No   OB History   Grav Para Term Preterm Abortions TAB SAB Ect Mult Living                 Review of Systems  Constitutional: Positive for activity change and appetite change. Negative for fever.  HENT: Positive for ear pain. Negative for congestion, ear discharge, postnasal drip, sore throat and trouble swallowing.   Respiratory: Positive for cough and shortness of breath. Negative for apnea and choking.   Cardiovascular: Positive for chest pain. Negative for palpitations and leg swelling.  Gastrointestinal: Negative.   Genitourinary: Negative for dysuria  and pelvic pain.       Dark colored urine  Musculoskeletal: Positive for myalgias and neck pain. Negative for joint swelling.  Skin: Negative.  Negative for rash and wound.  Neurological: Negative for seizures, facial asymmetry, speech difficulty and numbness.      Allergies  Ciprofloxacin; Aspirin; Codeine; and Penicillins  Home Medications   Current Outpatient Rx  Name  Route  Sig  Dispense  Refill  . hydrochlorothiazide (HYDRODIURIL) 25 MG tablet   Oral   Take 1 tablet (25 mg total) by mouth daily.   30 tablet   1   . ibuprofen (ADVIL,MOTRIN) 200 MG tablet   Oral   Take 400 mg by mouth 2 (two) times daily as needed for pain. For neck pain         . albuterol (PROVENTIL HFA;VENTOLIN HFA) 108 (90 BASE) MCG/ACT inhaler   Inhalation   Inhale 2 puffs into the lungs every 6 (six) hours as needed for wheezing or shortness of breath.   1 Inhaler   2   . cephALEXin (KEFLEX) 500 MG capsule   Oral   Take 1 capsule (500 mg total) by mouth 4 (four) times daily.   40 capsule   0   . diclofenac (CATAFLAM) 50 MG tablet   Oral   Take 1 tablet (50 mg total) by mouth 3 (three) times daily. Take with food   21 tablet  0   . fluticasone (FLONASE) 50 MCG/ACT nasal spray   Each Nare   Place 2 sprays into both nostrils daily.   16 g   1   . meclizine (ANTIVERT) 25 MG tablet   Oral   Take 1 tablet (25 mg total) by mouth 3 (three) times daily as needed for nausea.   30 tablet   1   . meclizine (ANTIVERT) 25 MG tablet   Oral   Take 1 tablet (25 mg total) by mouth 3 (three) times daily as needed for dizziness.   20 tablet   0   . predniSONE (DELTASONE) 20 MG tablet   Oral   Take 3 tablets (60 mg total) by mouth daily.   15 tablet   0   . sulfamethoxazole-trimethoprim (BACTRIM DS) 800-160 MG per tablet   Oral   Take 1 tablet by mouth 2 (two) times daily.   14 tablet   0   . traMADol (ULTRAM) 50 MG tablet   Oral   Take 1 tablet (50 mg total) by mouth every 6 (six)  hours as needed.   15 tablet   0    BP 106/67  Pulse 81  Temp(Src) 98.6 F (37 C) (Oral)  Resp 16  SpO2 98% Physical Exam  Nursing note and vitals reviewed. Constitutional: She is oriented to person, place, and time. She appears well-developed and well-nourished. No distress.  HENT:  Mouth/Throat: No oropharyngeal exudate.  Left TM with no erythema but with minor retraction. Right TM with minor erythema and moderate to severe retraction. Oropharynx with mild erythema, cobblestoning and clear PND  Eyes: Conjunctivae and EOM are normal. Pupils are equal, round, and reactive to light.  Neck: Normal range of motion.  Neck with full range of motion but slow due to muscle pain.  Cardiovascular: Normal rate, regular rhythm and normal heart sounds.   Pulmonary/Chest: Effort normal and breath sounds normal. No respiratory distress.  No wheezing with Tylenol by and expiration however with forced expiration or cough there is generalized coarseness.  Abdominal:  Chronic, old lower abdominal hernia.  Musculoskeletal: She exhibits tenderness. She exhibits no edema.   There is moderate to severe tenderness in the anterior chest wall, bilateral para sternal ribs, left clavicle at the manubrium junction. There is also tenderness to the bilateral trapezii muscle particularly along the ridges and areas of attachment to the neck.  Lymphadenopathy:    She has no cervical adenopathy.  Neurological: She is alert and oriented to person, place, and time. She has normal strength. No cranial nerve deficit or sensory deficit.  Skin: Skin is warm and dry.    ED Course  Procedures (including critical care time) Labs Review Labs Reviewed - No data to display Imaging Review No results found.    MDM   Final diagnoses:  Chest wall pain  Costochondritis  Strain of neck muscle  Dizziness  Labyrinthitis of right ear  Tobacco abuse      Meclizine 25 mg 3 times a day when necessary  dizziness Cataflam 50 mg 3 times a day with food when necessary chest wall pain Tramadol 50 mg Q6 hours when necessary pain Drink plenty of fluids stay well hydrated Stop smoking Followup with your PCP in one week.   Hayden Rasmussenavid Gail Vendetti, NP 07/26/13 [phone]

## 2013-07-26 NOTE — ED Notes (Signed)
C/o dizziness that started 2 days ago. Stated that she has pain in neck/collar bone area, and it hurts when you press in her chest area. Tried Advil with no relief. Pain is 5/10. Written by: Marga MelnickQuaNeisha Jones, SMA

## 2013-07-26 NOTE — Discharge Instructions (Signed)
Benign Positional Vertigo Vertigo means you feel like you or your surroundings are moving when they are not. Benign positional vertigo is the most common form of vertigo. Benign means that the cause of your condition is not serious. Benign positional vertigo is more common in older adults. CAUSES  Benign positional vertigo is the result of an upset in the labyrinth system. This is an area in the middle ear that helps control your balance. This may be caused by a viral infection, head injury, or repetitive motion. However, often no specific cause is found. SYMPTOMS  Symptoms of benign positional vertigo occur when you move your head or eyes in different directions. Some of the symptoms may include:  Loss of balance and falls.  Vomiting.  Blurred vision.  Dizziness.  Nausea.  Involuntary eye movements (nystagmus). DIAGNOSIS  Benign positional vertigo is usually diagnosed by physical exam. If the specific cause of your benign positional vertigo is unknown, your caregiver may perform imaging tests, such as magnetic resonance imaging (MRI) or computed tomography (CT). TREATMENT  Your caregiver may recommend movements or procedures to correct the benign positional vertigo. Medicines such as meclizine, benzodiazepines, and medicines for nausea may be used to treat your symptoms. In rare cases, if your symptoms are caused by certain conditions that affect the inner ear, you may need surgery. HOME CARE INSTRUCTIONS   Follow your caregiver's instructions.  Move slowly. Do not make sudden body or head movements.  Avoid driving.  Avoid operating heavy machinery.  Avoid performing any tasks that would be dangerous to you or others during a vertigo episode.  Drink enough fluids to keep your urine clear or pale yellow. SEEK IMMEDIATE MEDICAL CARE IF:   You develop problems with walking, weakness, numbness, or using your arms, hands, or legs.  You have difficulty speaking.  You develop  severe headaches.  Your nausea or vomiting continues or gets worse.  You develop visual changes.  Your family or friends notice any behavioral changes.  Your condition gets worse.  You have a fever.  You develop a stiff neck or sensitivity to light. MAKE SURE YOU:   Understand these instructions.  Will watch your condition.  Will get help right away if you are not doing well or get worse. Document Released: 03/08/2006 Document Revised: 08/23/2011 Document Reviewed: 02/18/2011 [facility] Patient Information 2014 [place], [place].  Chest Wall Pain Chest wall pain is pain in or around the bones and muscles of your chest. It may take up to 6 weeks to get better. It may take longer if you must stay physically active in your work and activities.  CAUSES  Chest wall pain may happen on its own. However, it may be caused by:  A viral illness like the flu.  Injury.  Coughing.  Exercise.  Arthritis.  Fibromyalgia.  Shingles. HOME CARE INSTRUCTIONS   Avoid overtiring physical activity. Try not to strain or perform activities that cause pain. This includes any activities using your chest or your abdominal and side muscles, especially if heavy weights are used.  Put ice on the sore area.  Put ice in a plastic bag.  Place a towel between your skin and the bag.  Leave the ice on for 15-20 minutes per hour while awake for the first 2 days.  Only take over-the-counter or prescription medicines for pain, discomfort, or fever as directed by your caregiver. SEEK IMMEDIATE MEDICAL CARE IF:   Your pain increases, or you are very uncomfortable.  You have a fever.  Your chest pain becomes worse.  You have new, unexplained symptoms.  You have nausea or vomiting.  You feel sweaty or lightheaded.  You have a cough with phlegm (sputum), or you cough up blood. MAKE SURE YOU:   Understand these instructions.  Will watch your condition.  Will get help right away if you are not  doing well or get worse. Document Released: 05/31/2005 Document Revised: 08/23/2011 Document Reviewed: 01/25/2011 [facility] Patient Information 2014 [place], [place].  Costochondritis Costochondritis, sometimes called Tietze syndrome, is a swelling and irritation (inflammation) of the tissue (cartilage) that connects your ribs with your breastbone (sternum). It causes pain in the chest and rib area. Costochondritis usually goes away on its own over time. It can take up to 6 weeks or longer to get better, especially if you are unable to limit your activities. CAUSES  Some cases of costochondritis have no known cause. Possible causes include:  Injury (trauma).  Exercise or activity such as lifting.  Severe coughing. SIGNS AND SYMPTOMS  Pain and tenderness in the chest and rib area.  Pain that gets worse when coughing or taking deep breaths.  Pain that gets worse with specific movements. DIAGNOSIS  Your health care provider will do a physical exam and ask about your symptoms. Chest X-rays or other tests may be done to rule out other problems. TREATMENT  Costochondritis usually goes away on its own over time. Your health care provider may prescribe medicine to help relieve pain. HOME CARE INSTRUCTIONS   Avoid exhausting physical activity. Try not to strain your ribs during normal activity. This would include any activities using chest, abdominal, and side muscles, especially if heavy weights are used.  Apply ice to the affected area for the first 2 days after the pain begins.  Put ice in a plastic bag.  Place a towel between your skin and the bag.  Leave the ice on for 20 minutes, 2 3 times a day.  Only take over-the-counter or prescription medicines as directed by your health care provider. SEEK MEDICAL CARE IF:  You have redness or swelling at the rib joints. These are signs of infection.  Your pain does not go away despite rest or medicine. SEEK IMMEDIATE MEDICAL CARE IF:    Your pain increases or you are very uncomfortable.  You have shortness of breath or difficulty breathing.  You cough up blood.  You have worse chest pains, sweating, or vomiting.  You have a fever or persistent symptoms for more than 2 3 days.  You have a fever and your symptoms suddenly get worse. MAKE SURE YOU:   Understand these instructions.  Will watch your condition.  Will get help right away if you are not doing well or get worse. Document Released: 03/10/2005 Document Revised: 03/21/2013 Document Reviewed: 01/02/2013 [facility] Patient Information 2014 [place], [place].  Dizziness Dizziness is a common problem. It is a feeling of unsteadiness or lightheadedness. You may feel like you are about to faint. Dizziness can lead to injury if you stumble or fall. A person of any age group can suffer from dizziness, but dizziness is more common in older adults. CAUSES  Dizziness can be caused by many different things, including:  Middle ear problems.  Standing for too long.  Infections.  An allergic reaction.  Aging.  An emotional response to something, such as the sight of blood.  Side effects of medicines.  Fatigue.  Problems with circulation or blood pressure.  Excess use of alcohol, medicines, or illegal drug  use.  Breathing too fast (hyperventilation).  An arrhythmia or problems with your heart rhythm.  Low red blood cell count (anemia).  Pregnancy.  Vomiting, diarrhea, fever, or other illnesses that cause dehydration.  Diseases or conditions such as Parkinson's disease, high blood pressure (hypertension), diabetes, and thyroid problems.  Exposure to extreme heat. DIAGNOSIS  To find the cause of your dizziness, your caregiver may do a physical exam, lab tests, radiologic imaging scans, or an electrocardiography test (ECG).  TREATMENT  Treatment of dizziness depends on the cause of your symptoms and can vary greatly. HOME CARE INSTRUCTIONS   Drink  enough fluids to keep your urine clear or pale yellow. This is especially important in very hot weather. In the elderly, it is also important in cold weather.  If your dizziness is caused by medicines, take them exactly as directed. When taking blood pressure medicines, it is especially important to get up slowly.  Rise slowly from chairs and steady yourself until you feel okay.  In the morning, first sit up on the side of the bed. When this seems okay, stand slowly while holding onto something until you know your balance is fine.  If you need to stand in one place for a long time, be sure to move your legs often. Tighten and relax the muscles in your legs while standing.  If dizziness continues to be a problem, have someone stay with you for a day or two. Do this until you feel you are well enough to stay alone. Have the person call your caregiver if he or she notices changes in you that are concerning.  Do not drive or use heavy machinery if you feel dizzy.  Do not drink alcohol. SEEK IMMEDIATE MEDICAL CARE IF:   Your dizziness or lightheadedness gets worse.  You feel nauseous or vomit.  You develop problems with talking, walking, weakness, or using your arms, hands, or legs.  You are not thinking clearly or you have difficulty forming sentences. It may take a friend or family member to determine if your thinking is normal.  You develop chest pain, abdominal pain, shortness of breath, or sweating.  Your vision changes.  You notice any bleeding.  You have side effects from medicine that seems to be getting worse rather than better. MAKE SURE YOU:   Understand these instructions.  Will watch your condition.  Will get help right away if you are not doing well or get worse. Document Released: 11/24/2000 Document Revised: 08/23/2011 Document Reviewed: 12/18/2010 [facility] Patient Information 2014 [place], [place].  Muscle Strain A muscle strain is an injury that occurs when a  muscle is stretched beyond its normal length. Usually a small number of muscle fibers are torn when this happens. Muscle strain is rated in degrees. First-degree strains have the least amount of muscle fiber tearing and pain. Second-degree and third-degree strains have increasingly more tearing and pain.  Usually, recovery from muscle strain takes 1 2 weeks. Complete healing takes 5 6 weeks.  CAUSES  Muscle strain happens when a sudden, violent force placed on a muscle stretches it too far. This may occur with lifting, sports, or a fall.  RISK FACTORS Muscle strain is especially common in athletes.  SIGNS AND SYMPTOMS At the site of the muscle strain, there may be:  Pain.  Bruising.  Swelling.  Difficulty using the muscle due to pain or lack of normal function. DIAGNOSIS  Your health care provider will perform a physical exam and ask about your medical  history. TREATMENT  Often, the best treatment for a muscle strain is resting, icing, and applying cold compresses to the injured area.  HOME CARE INSTRUCTIONS   Use the PRICE method of treatment to promote muscle healing during the first 2 3 days after your injury. The PRICE method involves:  Protecting the muscle from being injured again.  Restricting your activity and resting the injured body part.  Icing your injury. To do this, put ice in a plastic bag. Place a towel between your skin and the bag. Then, apply the ice and leave it on from 15 20 minutes each hour. After the third day, switch to moist heat packs.  Apply compression to the injured area with a splint or elastic bandage. Be careful not to wrap it too tightly. This may interfere with blood circulation or increase swelling.  Elevate the injured body part above the level of your heart as often as you can.  Only take over-the-counter or prescription medicines for pain, discomfort, or fever as directed by your health care provider.  Warming up prior to exercise helps to  prevent future muscle strains. SEEK MEDICAL CARE IF:   You have increasing pain or swelling in the injured area.  You have numbness, tingling, or a significant loss of strength in the injured area. MAKE SURE YOU:   Understand these instructions.  Will watch your condition.  Will get help right away if you are not doing well or get worse. Document Released: 05/31/2005 Document Revised: 03/21/2013 Document Reviewed: 12/28/2012 [facility] Patient Information 2014 [place], [place].  Labyrinthitis (Inner Ear Inflammation) Your exam shows you have an inner ear disturbance or labyrinthitis. The cause of this condition is not known. But it may be due to a virus infection. The symptoms of labyrinthitis include vertigo or dizziness made worse by motion, nausea and vomiting. The onset of labyrinthitis may be very sudden. It usually lasts for a few days and then clears up over 1-2 weeks. The treatment of an inner ear disturbance includes bed rest and medications to reduce dizziness, nausea, and vomiting. You should stay away from alcohol, tranquilizers, caffeine, nicotine, or any medicine your doctor thinks may make your symptoms worse. Further testing may be needed to evaluate your hearing and balance system. Please see your doctor or go to the emergency room right away if you have:  Increasing vertigo, earache, loss of hearing, or ear drainage.  Headache, blurred vision, trouble walking, fainting, or fever.  Persistent vomiting, dehydration, or extreme weakness. Document Released: 05/31/2005 Document Revised: 08/23/2011 Document Reviewed: 11/16/2006 [facility] Patient Information 2014 [place], [place].

## 2013-07-26 NOTE — ED Provider Notes (Signed)
Medical screening examination/treatment/procedure(s) were performed by a resident physician or non-physician practitioner and as the supervising physician I was immediately available for consultation/collaboration.  Evan Corey, MD    Evan S Corey, MD 07/26/13 2115 

## 2013-08-15 ENCOUNTER — Other Ambulatory Visit: Payer: Self-pay | Admitting: Internal Medicine

## 2013-08-16 ENCOUNTER — Other Ambulatory Visit: Payer: Self-pay | Admitting: Pharmacist

## 2013-08-16 DIAGNOSIS — I10 Essential (primary) hypertension: Secondary | ICD-10-CM

## 2013-08-16 MED ORDER — HYDROCHLOROTHIAZIDE 25 MG PO TABS: ORAL_TABLET | ORAL | Status: DC

## 2013-08-20 ENCOUNTER — Encounter: Payer: Self-pay | Admitting: Internal Medicine

## 2013-08-20 ENCOUNTER — Ambulatory Visit: Payer: No Typology Code available for payment source | Attending: Internal Medicine | Admitting: Internal Medicine

## 2013-08-20 VITALS — BP 152/86 | HR 80 | Temp 98.7°F | Resp 16

## 2013-08-20 DIAGNOSIS — R21 Rash and other nonspecific skin eruption: Secondary | ICD-10-CM

## 2013-08-20 DIAGNOSIS — N898 Other specified noninflammatory disorders of vagina: Secondary | ICD-10-CM

## 2013-08-20 DIAGNOSIS — I10 Essential (primary) hypertension: Secondary | ICD-10-CM

## 2013-08-20 LAB — POCT URINALYSIS DIPSTICK
Bilirubin, UA: NEGATIVE
Blood, UA: NEGATIVE
Glucose, UA: NEGATIVE
Nitrite, UA: NEGATIVE
Spec Grav, UA: 1.025
Urobilinogen, UA: 1
pH, UA: 6

## 2013-08-20 MED ORDER — FLUCONAZOLE 150 MG PO TABS: 150.0000 mg | ORAL_TABLET | Freq: Once | ORAL | Status: DC

## 2013-08-20 MED ORDER — METRONIDAZOLE 500 MG PO TABS: 500.0000 mg | ORAL_TABLET | Freq: Three times a day (TID) | ORAL | Status: DC

## 2013-08-20 MED ORDER — HYDROCHLOROTHIAZIDE 25 MG PO TABS: ORAL_TABLET | ORAL | Status: DC

## 2013-08-20 MED ORDER — NYSTATIN-TRIAMCINOLONE 100000-0.1 UNIT/GM-% EX OINT: 1.0000 "application " | TOPICAL_OINTMENT | Freq: Two times a day (BID) | CUTANEOUS | Status: DC

## 2013-08-20 NOTE — Progress Notes (Signed)
MRN: [id number] Name: Maria Oliver  Sex: female Age: 53 y.o. DOB: 12/24/1960  Allergies: Ciprofloxacin; Aspirin; Codeine; and Penicillins  Chief Complaint  Patient presents with  . Vaginal Discharge    HPI: Patient is 53 y.o. female who complained of vaginal discharge whitish in color also reported to have rash in her right groin which gets itchy, some burning sensation after urination, denies any frequency urgency, denies any nausea vomiting.   Past Medical History  Diagnosis Date  . Arthritis   . COPD (chronic obstructive pulmonary disease)   . Hypertension   . Generalized headaches   . Leg swelling   . Abdominal pain   . Anxiety     Past Surgical History  Procedure Laterality Date  . Hernia repair  2010  . Colon surgery  2010    per patient "colon take down"      Medication List       This list is accurate as of: 08/20/13  5:12 PM.  Always use your most recent med list.               albuterol 108 (90 BASE) MCG/ACT inhaler  Commonly known as:  PROVENTIL HFA;VENTOLIN HFA  Inhale 2 puffs into the lungs every 6 (six) hours as needed for wheezing or shortness of breath.     cephALEXin 500 MG capsule  Commonly known as:  KEFLEX  Take 1 capsule (500 mg total) by mouth 4 (four) times daily.     diclofenac 50 MG tablet  Commonly known as:  CATAFLAM  Take 1 tablet (50 mg total) by mouth 3 (three) times daily. Take with food     fluconazole 150 MG tablet  Commonly known as:  DIFLUCAN  Take 1 tablet (150 mg total) by mouth once.     fluticasone 50 MCG/ACT nasal spray  Commonly known as:  FLONASE  Place 2 sprays into both nostrils daily.     hydrochlorothiazide 25 MG tablet  Commonly known as:  HYDRODIURIL  TAKE 1 TABLET BY MOUTH ONCE DAILY     ibuprofen 200 MG tablet  Commonly known as:  ADVIL,MOTRIN  Take 400 mg by mouth 2 (two) times daily as needed for pain. For neck pain     meclizine 25 MG tablet  Commonly known as:  ANTIVERT  Take 1 tablet  (25 mg total) by mouth 3 (three) times daily as needed for nausea.     meclizine 25 MG tablet  Commonly known as:  ANTIVERT  Take 1 tablet (25 mg total) by mouth 3 (three) times daily as needed for dizziness.     metroNIDAZOLE 500 MG tablet  Commonly known as:  FLAGYL  Take 1 tablet (500 mg total) by mouth 3 (three) times daily.     nystatin-triamcinolone ointment  Commonly known as:  MYCOLOG  Apply 1 application topically 2 (two) times daily.     predniSONE 20 MG tablet  Commonly known as:  DELTASONE  Take 3 tablets (60 mg total) by mouth daily.     sulfamethoxazole-trimethoprim 800-160 MG per tablet  Commonly known as:  BACTRIM DS  Take 1 tablet by mouth 2 (two) times daily.     traMADol 50 MG tablet  Commonly known as:  ULTRAM  Take 1 tablet (50 mg total) by mouth every 6 (six) hours as needed.        Meds ordered this encounter  Medications  . metroNIDAZOLE (FLAGYL) 500 MG tablet    Sig: Take 1  tablet (500 mg total) by mouth 3 (three) times daily.    Dispense:  21 tablet    Refill:  0  . fluconazole (DIFLUCAN) 150 MG tablet    Sig: Take 1 tablet (150 mg total) by mouth once.    Dispense:  1 tablet    Refill:  0  . nystatin-triamcinolone ointment (MYCOLOG)    Sig: Apply 1 application topically 2 (two) times daily.    Dispense:  30 g    Refill:  1     There is no immunization history on file for this patient.  Family History  Problem Relation Age of Onset  . Heart disease Mother 156  . Heart failure Mother   . Asthma Mother   . Alzheimer's disease Father 3467    History  Substance Use Topics  . Smoking status: Current Every Day Smoker -- 1.00 packs/day for 40 years  . Smokeless tobacco: Never Used  . Alcohol Use: No    Review of Systems   As noted in HPI  Filed Vitals:   08/20/13 1650  BP: 152/86  Pulse: 80  Temp: 98.7 F (37.1 C)  Resp: 16    Physical Exam  Physical Exam  Eyes: EOM are normal. Pupils are equal, round, and reactive to  light.  Cardiovascular: Normal rate and regular rhythm.   Pulmonary/Chest: Breath sounds normal. She has no wheezes. She has no rales.  Abdominal: There is no tenderness. There is no rebound.  Skin:  Patient examined in the presence of medical staff as chaperone, Rash in the right groin area    CBC    Component Value Date/Time   WBC 8.5 05/09/2013 1445   RBC 5.36* 05/09/2013 1445   HGB 15.5* 05/09/2013 1445   HCT 45.6 05/09/2013 1445   PLT 368 05/09/2013 1445   MCV 85.1 05/09/2013 1445   LYMPHSABS 3.1 05/09/2013 1445   MONOABS 0.7 05/09/2013 1445   EOSABS 0.3 05/09/2013 1445   BASOSABS 0.0 05/09/2013 1445    CMP     Component Value Date/Time   NA 139 05/09/2013 1445   K 4.1 05/09/2013 1445   CL 103 05/09/2013 1445   CO2 30 05/09/2013 1445   GLUCOSE 78 05/09/2013 1445   BUN 7 05/09/2013 1445   CREATININE 0.69 05/09/2013 1445   CREATININE 0.70 10/20/2012 1513   CALCIUM 9.9 05/09/2013 1445   PROT 7.4 05/09/2013 1445   ALBUMIN 4.5 05/09/2013 1445   AST 16 05/09/2013 1445   ALT 14 05/09/2013 1445   ALKPHOS 77 05/09/2013 1445   BILITOT 0.4 05/09/2013 1445   GFRNONAA >90 10/20/2012 1513   GFRAA >90 10/20/2012 1513    Lab Results  Component Value Date/Time   CHOL 221* 05/09/2013  2:45 PM    No components found with this basename: hga1c    Lab Results  Component Value Date/Time   AST 16 05/09/2013  2:45 PM    Assessment and Plan  Discharge of vagina - Plan: Urinalysis Dipstick  Results for orders placed in visit on 08/20/13  POCT URINALYSIS DIPSTICK      Result Value Ref Range   Color, UA yellow     Clarity, UA cloudy     Glucose, UA neg     Bilirubin, UA neg     Ketones, UA trace     Spec Grav, UA 1.025     Blood, UA neg     pH, UA 6.0     Protein, UA 30mg   Urobilinogen, UA 1.0     Nitrite, UA neg     Leukocytes, UA Trace      metroNIDAZOLE (FLAGYL) 500 MG tablet, fluconazole (DIFLUCAN) 150 MG tablet for vaginal discharge   Rash - Plan:  nystatin-triamcinolone  cream  (MYCOLOG)  Patient will be scheduled with Dr. Joseph Art for PAP smear    Return in about 4 months (around 12/20/2013).  [name], MD

## 2013-08-20 NOTE — Progress Notes (Signed)
Patient complains of having some vaginal discharge with odor And some burning when she voids

## 2013-08-28 ENCOUNTER — Other Ambulatory Visit: Payer: Self-pay

## 2013-08-28 ENCOUNTER — Ambulatory Visit: Payer: No Typology Code available for payment source | Attending: Internal Medicine

## 2013-08-28 DIAGNOSIS — Z760 Encounter for issue of repeat prescription: Secondary | ICD-10-CM

## 2013-08-28 MED ORDER — ALBUTEROL SULFATE HFA 108 (90 BASE) MCG/ACT IN AERS: 2.0000 | INHALATION_SPRAY | Freq: Four times a day (QID) | RESPIRATORY_TRACT | Status: DC | PRN

## 2013-09-09 ENCOUNTER — Emergency Department (HOSPITAL_COMMUNITY)
Admission: EM | Admit: 2013-09-09 | Discharge: 2013-09-09 | Disposition: A | Discharge status: Home / Self Care | Payer: No Typology Code available for payment source | Attending: Emergency Medicine | Admitting: Emergency Medicine

## 2013-09-09 ENCOUNTER — Encounter (HOSPITAL_COMMUNITY): Payer: Self-pay | Admitting: Emergency Medicine

## 2013-09-09 DIAGNOSIS — IMO0002 Reserved for concepts with insufficient information to code with codable children: Secondary | ICD-10-CM | POA: Insufficient documentation

## 2013-09-09 DIAGNOSIS — J449 Chronic obstructive pulmonary disease, unspecified: Secondary | ICD-10-CM | POA: Insufficient documentation

## 2013-09-09 DIAGNOSIS — F17200 Nicotine dependence, unspecified, uncomplicated: Secondary | ICD-10-CM | POA: Insufficient documentation

## 2013-09-09 DIAGNOSIS — M25562 Pain in left knee: Secondary | ICD-10-CM

## 2013-09-09 DIAGNOSIS — M129 Arthropathy, unspecified: Secondary | ICD-10-CM | POA: Insufficient documentation

## 2013-09-09 DIAGNOSIS — Z79899 Other long term (current) drug therapy: Secondary | ICD-10-CM | POA: Insufficient documentation

## 2013-09-09 DIAGNOSIS — I10 Essential (primary) hypertension: Secondary | ICD-10-CM | POA: Insufficient documentation

## 2013-09-09 DIAGNOSIS — M25569 Pain in unspecified knee: Secondary | ICD-10-CM | POA: Insufficient documentation

## 2013-09-09 DIAGNOSIS — J4489 Other specified chronic obstructive pulmonary disease: Secondary | ICD-10-CM | POA: Insufficient documentation

## 2013-09-09 DIAGNOSIS — Z880 Allergy status to penicillin: Secondary | ICD-10-CM | POA: Insufficient documentation

## 2013-09-09 DIAGNOSIS — F411 Generalized anxiety disorder: Secondary | ICD-10-CM | POA: Insufficient documentation

## 2013-09-09 MED ORDER — HYDROCODONE-ACETAMINOPHEN 5-325 MG PO TABS: 1.0000 | ORAL_TABLET | ORAL | Status: DC | PRN

## 2013-09-09 NOTE — Discharge Instructions (Signed)
Take vicodin as prescribed for severe pain.   Do not drive within four hours of taking this medication (may cause drowsiness or confusion).  Take up to 800mg of ibuprofen three times a day for the next 3-4 days (take with food).  Ice 3 times a day for 15-20 minutes.  Elevate when possible to decrease swelling and pain.  Activity as tolerated.  Follow up with the orthopedist if your pain has not started to improve in 5-7 days, or you develop weakness of the injured joint.    You may return to the ER if your pain worsens or you have any other concerns.   °

## 2013-09-09 NOTE — ED Notes (Signed)
Pt reports pain to left knee x 4 days. Denies injury to knee. Ambulatory at triage.

## 2013-09-11 NOTE — ED Provider Notes (Signed)
CSN: [id number]     Arrival date & time 09/09/13  1513 History   First MD Initiated Contact with Patient 09/09/13 1626     Chief Complaint  Patient presents with  . Knee Pain     (Consider location/radiation/quality/duration/timing/severity/associated sxs/prior Treatment) HPI History provided by pt.   Pt c/o diffuse, constant L knee pain that is aggravated by bearing weight for the past 4 days.  Associated w/ giving way.  No associated fever, edema, skin changes, paresthesias or other arthralgias.  H/o torn meniscus and this pain feels similar.  Past Medical History  Diagnosis Date  . Arthritis   . COPD (chronic obstructive pulmonary disease)   . Hypertension   . Generalized headaches   . Leg swelling   . Abdominal pain   . Anxiety    Past Surgical History  Procedure Laterality Date  . Hernia repair  2010  . Colon surgery  2010    per patient "colon take down"   Family History  Problem Relation Age of Onset  . Heart disease Mother 53  . Heart failure Mother   . Asthma Mother   . Alzheimer's disease Father 35   History  Substance Use Topics  . Smoking status: Current Every Day Smoker -- 1.00 packs/day for 40 years  . Smokeless tobacco: Never Used  . Alcohol Use: No   OB History   Grav Para Term Preterm Abortions TAB SAB Ect Mult Living                 Review of Systems  All other systems reviewed and are negative.      Allergies  Ciprofloxacin; Aspirin; Codeine; and Penicillins  Home Medications   Current Outpatient Rx  Name  Route  Sig  Dispense  Refill  . albuterol (PROVENTIL HFA;VENTOLIN HFA) 108 (90 BASE) MCG/ACT inhaler   Inhalation   Inhale 2 puffs into the lungs every 6 (six) hours as needed for wheezing or shortness of breath.   3 Inhaler   3   . cephALEXin (KEFLEX) 500 MG capsule   Oral   Take 1 capsule (500 mg total) by mouth 4 (four) times daily.   40 capsule   0   . diclofenac (CATAFLAM) 50 MG tablet   Oral   Take 1 tablet (50 mg  total) by mouth 3 (three) times daily. Take with food   21 tablet   0   . fluconazole (DIFLUCAN) 150 MG tablet   Oral   Take 1 tablet (150 mg total) by mouth once.   1 tablet   0   . fluticasone (FLONASE) 50 MCG/ACT nasal spray   Each Nare   Place 2 sprays into both nostrils daily.   16 g   1   . hydrochlorothiazide (HYDRODIURIL) 25 MG tablet      TAKE 1 TABLET BY MOUTH ONCE DAILY   30 tablet   3   . HYDROcodone-acetaminophen (NORCO/VICODIN) 5-325 MG per tablet   Oral   Take 1 tablet by mouth every 4 (four) hours as needed for moderate pain.   20 tablet   0   . ibuprofen (ADVIL,MOTRIN) 200 MG tablet   Oral   Take 400 mg by mouth 2 (two) times daily as needed for pain. For neck pain         . meclizine (ANTIVERT) 25 MG tablet   Oral   Take 1 tablet (25 mg total) by mouth 3 (three) times daily as needed for nausea.  30 tablet   1   . meclizine (ANTIVERT) 25 MG tablet   Oral   Take 1 tablet (25 mg total) by mouth 3 (three) times daily as needed for dizziness.   20 tablet   0   . metroNIDAZOLE (FLAGYL) 500 MG tablet   Oral   Take 1 tablet (500 mg total) by mouth 3 (three) times daily.   21 tablet   0   . nystatin-triamcinolone ointment (MYCOLOG)   Topical   Apply 1 application topically 2 (two) times daily.   30 g   1   . predniSONE (DELTASONE) 20 MG tablet   Oral   Take 3 tablets (60 mg total) by mouth daily.   15 tablet   0   . sulfamethoxazole-trimethoprim (BACTRIM DS) 800-160 MG per tablet   Oral   Take 1 tablet by mouth 2 (two) times daily.   14 tablet   0   . traMADol (ULTRAM) 50 MG tablet   Oral   Take 1 tablet (50 mg total) by mouth every 6 (six) hours as needed.   15 tablet   0    BP 136/93  Pulse 69  Temp(Src) 98.6 F (37 C) (Oral)  Resp 18  SpO2 97% Physical Exam  Nursing note and vitals reviewed. Constitutional: She is oriented to person, place, and time. She appears well-developed and well-nourished. No distress.  HENT:   Head: Normocephalic and atraumatic.  Eyes:  Normal appearance  Neck: Normal range of motion.  Pulmonary/Chest: Effort normal.  Musculoskeletal: Normal range of motion.  L knee w/ou deformity, erythema, edema or warmth.  Diffuse tenderness, worst anteriorly.  Minimal discomfort w/ passive flexion/extension.  2+ DP pulse and distal sensation intact.    Neurological: She is alert and oriented to person, place, and time.  Psychiatric: She has a normal mood and affect. Her behavior is normal.    ED Course  Procedures (including critical care time) Labs Review Labs Reviewed - No data to display Imaging Review No results found.   EKG Interpretation None      MDM   Final diagnoses:  Pain in left knee    53yo F presents w/ non-traumatic L knee pain x 4 days.  No sign of septic arthritis on exam.  Will treat symptomatically w/ knee immobilizer, provided by ortho tech, vicodin, NSAID, rest, ice, elevation and refer to ortho for persistent/worsening sx. Return precautions discussed.     Otilio Miuatherine E Diron Haddon, PA-C 09/11/13 [phone]

## 2013-09-12 NOTE — ED Provider Notes (Signed)
Medical screening examination/treatment/procedure(s) were performed by non-physician practitioner and as supervising physician I was immediately available for consultation/collaboration.     Abigayle Wilinski, MD 09/12/13 2350 

## 2013-10-07 ENCOUNTER — Emergency Department (HOSPITAL_COMMUNITY): Payer: PRIVATE HEALTH INSURANCE

## 2013-10-07 ENCOUNTER — Emergency Department (HOSPITAL_COMMUNITY)
Admission: EM | Admit: 2013-10-07 | Discharge: 2013-10-07 | Disposition: A | Discharge status: Home / Self Care | Payer: PRIVATE HEALTH INSURANCE | Attending: Emergency Medicine | Admitting: Emergency Medicine

## 2013-10-07 ENCOUNTER — Encounter (HOSPITAL_COMMUNITY): Payer: Self-pay | Admitting: Emergency Medicine

## 2013-10-07 DIAGNOSIS — S59919A Unspecified injury of unspecified forearm, initial encounter: Principal | ICD-10-CM

## 2013-10-07 DIAGNOSIS — I10 Essential (primary) hypertension: Secondary | ICD-10-CM | POA: Insufficient documentation

## 2013-10-07 DIAGNOSIS — S6990XA Unspecified injury of unspecified wrist, hand and finger(s), initial encounter: Principal | ICD-10-CM

## 2013-10-07 DIAGNOSIS — F411 Generalized anxiety disorder: Secondary | ICD-10-CM | POA: Insufficient documentation

## 2013-10-07 DIAGNOSIS — W208XXA Other cause of strike by thrown, projected or falling object, initial encounter: Secondary | ICD-10-CM | POA: Insufficient documentation

## 2013-10-07 DIAGNOSIS — Y929 Unspecified place or not applicable: Secondary | ICD-10-CM | POA: Insufficient documentation

## 2013-10-07 DIAGNOSIS — Z791 Long term (current) use of non-steroidal anti-inflammatories (NSAID): Secondary | ICD-10-CM | POA: Insufficient documentation

## 2013-10-07 DIAGNOSIS — Z23 Encounter for immunization: Secondary | ICD-10-CM | POA: Insufficient documentation

## 2013-10-07 DIAGNOSIS — Z79899 Other long term (current) drug therapy: Secondary | ICD-10-CM | POA: Insufficient documentation

## 2013-10-07 DIAGNOSIS — S6992XA Unspecified injury of left wrist, hand and finger(s), initial encounter: Secondary | ICD-10-CM

## 2013-10-07 DIAGNOSIS — S59909A Unspecified injury of unspecified elbow, initial encounter: Secondary | ICD-10-CM | POA: Insufficient documentation

## 2013-10-07 DIAGNOSIS — IMO0002 Reserved for concepts with insufficient information to code with codable children: Secondary | ICD-10-CM | POA: Insufficient documentation

## 2013-10-07 DIAGNOSIS — S6000XA Contusion of unspecified finger without damage to nail, initial encounter: Secondary | ICD-10-CM | POA: Insufficient documentation

## 2013-10-07 DIAGNOSIS — S60019A Contusion of unspecified thumb without damage to nail, initial encounter: Secondary | ICD-10-CM

## 2013-10-07 DIAGNOSIS — J449 Chronic obstructive pulmonary disease, unspecified: Secondary | ICD-10-CM | POA: Insufficient documentation

## 2013-10-07 DIAGNOSIS — Z880 Allergy status to penicillin: Secondary | ICD-10-CM | POA: Insufficient documentation

## 2013-10-07 DIAGNOSIS — Y939 Activity, unspecified: Secondary | ICD-10-CM | POA: Insufficient documentation

## 2013-10-07 DIAGNOSIS — J4489 Other specified chronic obstructive pulmonary disease: Secondary | ICD-10-CM | POA: Insufficient documentation

## 2013-10-07 DIAGNOSIS — M129 Arthropathy, unspecified: Secondary | ICD-10-CM | POA: Insufficient documentation

## 2013-10-07 DIAGNOSIS — F17200 Nicotine dependence, unspecified, uncomplicated: Secondary | ICD-10-CM | POA: Insufficient documentation

## 2013-10-07 DIAGNOSIS — Z792 Long term (current) use of antibiotics: Secondary | ICD-10-CM | POA: Insufficient documentation

## 2013-10-07 MED ORDER — TETANUS-DIPHTH-ACELL PERTUSSIS 5-2.5-18.5 LF-MCG/0.5 IM SUSP
0.5000 mL | Freq: Once | INTRAMUSCULAR | Status: AC
  Administered 2013-10-07: 0.5 mL via INTRAMUSCULAR
  Filled 2013-10-07: qty 0.5

## 2013-10-07 MED ORDER — IBUPROFEN 800 MG PO TABS: 800.0000 mg | ORAL_TABLET | Freq: Three times a day (TID) | ORAL | Status: DC | PRN

## 2013-10-07 MED ORDER — HYDROCODONE-ACETAMINOPHEN 5-325 MG PO TABS
1.0000 | ORAL_TABLET | Freq: Once | ORAL | Status: AC
  Administered 2013-10-07: 1 via ORAL
  Filled 2013-10-07: qty 1

## 2013-10-07 MED ORDER — HYDROCODONE-ACETAMINOPHEN 5-325 MG PO TABS: 1.0000 | ORAL_TABLET | Freq: Four times a day (QID) | ORAL | Status: DC | PRN

## 2013-10-07 NOTE — ED Provider Notes (Signed)
CSN: [id number]     Arrival date & time 10/07/13  1800 History   First MD Initiated Contact with Patient 10/07/13 1816     This chart was scribed for non-physician practitioner, Ebbie Ridgehris Tra Wilemon PA-C working with Ward GivensIva L Knapp, MD by Arlan OrganAshley Leger, ED Scribe. This patient was seen in room TR11C/TR11C and the patient's care was started at 6:29 PM.   Chief Complaint  Patient presents with  . Arm Pain   The history is provided by the patient. No language interpreter was used.    HPI Comments: Maria Oliver is a 53 y.o. Female with a PMHx of COPD, HTN, and arthritis who presents to the Emergency Department complaining of L arm pain since earlier today. Pt also reports bilateral wrist pain. 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 Currently rates her pain 8/10. She denies hitting her head at time of fall. Pt unaware of Tetanus status.  Patient denies loss of consciousness, nausea, vomiting, weakness, or numbness  Past Medical History  Diagnosis Date  . Arthritis   . COPD (chronic obstructive pulmonary disease)   . Hypertension   . Generalized headaches   . Leg swelling   . Abdominal pain   . Anxiety    Past Surgical History  Procedure Laterality Date  . Hernia repair  2010  . Colon surgery  2010    per patient "colon take down"   Family History  Problem Relation Age of Onset  . Heart disease Mother 6756  . Heart failure Mother   . Asthma Mother   . Alzheimer's disease Father 3767   History  Substance Use Topics  . Smoking status: Current Every Day Smoker -- 1.00 packs/day for 40 years  . Smokeless tobacco: Never Used  . Alcohol Use: No   OB History   Grav Para Term Preterm Abortions TAB SAB Ect Mult Living                 Review of Systems  All other systems negative except as documented in the HPI. All pertinent positives and negatives as reviewed in the HPI.   Allergies  Ciprofloxacin; Aspirin; Codeine; and Penicillins  Home Medications    Prior to Admission medications   Medication Sig Start Date End Date Taking? Authorizing Provider  albuterol (PROVENTIL HFA;VENTOLIN HFA) 108 (90 BASE) MCG/ACT inhaler Inhale 2 puffs into the lungs every 6 (six) hours as needed for wheezing or shortness of breath. 08/28/13   Doris Cheadleeepak Advani, MD  cephALEXin (KEFLEX) 500 MG capsule Take 1 capsule (500 mg total) by mouth 4 (four) times daily. 04/12/13   Dione Boozeavid Glick, MD  diclofenac (CATAFLAM) 50 MG tablet Take 1 tablet (50 mg total) by mouth 3 (three) times daily. Take with food 07/26/13   Hayden Rasmussenavid Mabe, NP  fluconazole (DIFLUCAN) 150 MG tablet Take 1 tablet (150 mg total) by mouth once. 08/20/13   Doris Cheadleeepak Advani, MD  fluticasone (FLONASE) 50 MCG/ACT nasal spray Place 2 sprays into both nostrils daily. 06/19/13   Doris Cheadleeepak Advani, MD  hydrochlorothiazide (HYDRODIURIL) 25 MG tablet TAKE 1 TABLET BY MOUTH ONCE DAILY 08/20/13   Doris Cheadleeepak Advani, MD  HYDROcodone-acetaminophen (NORCO/VICODIN) 5-325 MG per tablet Take 1 tablet by mouth every 4 (four) hours as needed for moderate pain. 09/09/13   Arie Sabinaatherine E Schinlever, PA-C  ibuprofen (ADVIL,MOTRIN) 200 MG tablet Take 400 mg by mouth 2 (two) times daily as needed for pain. For neck pain    Historical Provider, MD  meclizine (  ANTIVERT) 25 MG tablet Take 1 tablet (25 mg total) by mouth 3 (three) times daily as needed for nausea. 05/09/13   Doris Cheadleeepak Advani, MD  meclizine (ANTIVERT) 25 MG tablet Take 1 tablet (25 mg total) by mouth 3 (three) times daily as needed for dizziness. 07/26/13   Hayden Rasmussenavid Mabe, NP  metroNIDAZOLE (FLAGYL) 500 MG tablet Take 1 tablet (500 mg total) by mouth 3 (three) times daily. 08/20/13   Doris Cheadleeepak Advani, MD  nystatin-triamcinolone ointment (MYCOLOG) Apply 1 application topically 2 (two) times daily. 08/20/13   Doris Cheadleeepak Advani, MD  predniSONE (DELTASONE) 20 MG tablet Take 3 tablets (60 mg total) by mouth daily. 04/12/13   Dione Boozeavid Glick, MD  sulfamethoxazole-trimethoprim (BACTRIM DS) 800-160 MG per tablet Take 1 tablet by  mouth 2 (two) times daily. 06/19/13   Doris Cheadleeepak Advani, MD  traMADol (ULTRAM) 50 MG tablet Take 1 tablet (50 mg total) by mouth every 6 (six) hours as needed. 07/26/13   Hayden Rasmussenavid Mabe, NP   Triage Vitals: BP 143/91  Pulse 72  Temp(Src) 97.9 F (36.6 C) (Oral)  Resp 18  SpO2 99%   Physical Exam  Nursing note and vitals reviewed. Constitutional: She is oriented to person, place, and time. She appears well-developed and well-nourished.  HENT:  Head: Normocephalic and atraumatic.  Eyes: EOM are normal.  Neck: Normal range of motion.  Cardiovascular: Normal rate.   Pulmonary/Chest: Effort normal.  Musculoskeletal: Normal range of motion.       Left hand: She exhibits tenderness. She exhibits normal capillary refill, no deformity, no laceration and no swelling. Normal sensation noted. Normal strength noted.       Hands: Neurological: She is alert and oriented to person, place, and time.  Skin: Skin is warm and dry.  Psychiatric: She has a normal mood and affect. Her behavior is normal.    ED Course  Procedures (including critical care time)  DIAGNOSTIC STUDIES: Oxygen Saturation is 99% on RA, Normal by my interpretation.    COORDINATION OF CARE: 6:18 PM-Discussed treatment plan with pt at bedside and pt agreed to plan.     Labs Review Labs Reviewed - No data to display  Imaging Review Dg Wrist Complete Left  10/07/2013   CLINICAL DATA:  Left wrist pain after a fall.  EXAM: LEFT WRIST - COMPLETE 3+ VIEW  COMPARISON:  None.  FINDINGS: There is no acute bony or joint abnormality. Degenerative change about the first [facility] is noted. Soft tissues are unremarkable.  IMPRESSION: No acute abnormality.   Electronically Signed   By: Drusilla Kannerhomas  Dalessio M.D.   On: 10/07/2013 19:56   Dg Shoulder Left  10/07/2013   CLINICAL DATA:  Fall.  Left shoulder pain.  EXAM: LEFT SHOULDER - 2+ VIEW  COMPARISON:  DG SHOULDER*L* dated 11/29/2011  FINDINGS: Mild glenohumeral osteoarthritis is present with tiny  inferior humeral head osteophyte. Shoulder is located. There is no fracture.  IMPRESSION: Mild glenohumeral osteoarthritis.   Electronically Signed   By: Andreas NewportGeoffrey  Lamke M.D.   On: 10/07/2013 19:57   Dg Hand Complete Left  10/07/2013   CLINICAL DATA:  Trauma.  Fall.  Hand pain.  Abrasion.  EXAM: LEFT HAND - COMPLETE 3+ VIEW  COMPARISON:  None.  FINDINGS: Anatomic alignment. Basal joint of the thumb and mild STT joint osteoarthritis is present. Faint lucency through the ulnar styloid is present, likely representing a vascular channel. Mild first MCP joint osteoarthritis.  IMPRESSION: Osteoarthritis of the hand.  No acute osseous abnormality.   Electronically Signed  By: Andreas Newport M.D.   On: 10/07/2013 19:56   Patient be referred to orthopedics, to return here as needed.  Advised to use ice and elevation on the areas that are sore   I personally performed the services described in this documentation, which was scribed in my presence. The recorded information has been reviewed and is accurate.    Carlyle Dolly, PA-C 10/07/13 2028

## 2013-10-07 NOTE — Discharge Instructions (Signed)
Return here as needed.  Followup in the orthopedic Dr. needed.  Ice and elevate the areas that hurt

## 2013-10-07 NOTE — ED Provider Notes (Signed)
Medical screening examination/treatment/procedure(s) were performed by non-physician practitioner and as supervising physician I was immediately available for consultation/collaboration.   EKG Interpretation None      Devoria AlbeIva Anesa Fronek, MD, Armando GangFACEP   Ward GivensIva L Florina Glas, MD 10/07/13 2300

## 2013-10-07 NOTE — ED Notes (Signed)
Onset 5:15p pt tripped and fell over uneven concrete.  Fell on left hip, knee, shoulder and bilateral hands.  Left wrist swollen and painful to move, guarding hand.  No other s/s noted.

## 2013-10-07 NOTE — ED Notes (Signed)
Pt presents to department for evaluation of fall this afternoon, states L arm and bilateral wrist pain. No obvious deformities noted. 8/10 pain upon arrival. Able to wiggle digits, capillary refill less than 2 seconds. Pt is alert and oriented x4.

## 2013-10-17 ENCOUNTER — Other Ambulatory Visit: Payer: No Typology Code available for payment source

## 2013-12-04 ENCOUNTER — Encounter (HOSPITAL_COMMUNITY): Payer: Self-pay | Admitting: Emergency Medicine

## 2013-12-04 DIAGNOSIS — Z791 Long term (current) use of non-steroidal anti-inflammatories (NSAID): Secondary | ICD-10-CM | POA: Insufficient documentation

## 2013-12-04 DIAGNOSIS — M129 Arthropathy, unspecified: Secondary | ICD-10-CM | POA: Insufficient documentation

## 2013-12-04 DIAGNOSIS — J441 Chronic obstructive pulmonary disease with (acute) exacerbation: Secondary | ICD-10-CM | POA: Insufficient documentation

## 2013-12-04 DIAGNOSIS — IMO0002 Reserved for concepts with insufficient information to code with codable children: Secondary | ICD-10-CM | POA: Insufficient documentation

## 2013-12-04 DIAGNOSIS — Z880 Allergy status to penicillin: Secondary | ICD-10-CM | POA: Insufficient documentation

## 2013-12-04 DIAGNOSIS — Z79899 Other long term (current) drug therapy: Secondary | ICD-10-CM | POA: Insufficient documentation

## 2013-12-04 DIAGNOSIS — Z8659 Personal history of other mental and behavioral disorders: Secondary | ICD-10-CM | POA: Insufficient documentation

## 2013-12-04 DIAGNOSIS — I10 Essential (primary) hypertension: Secondary | ICD-10-CM | POA: Insufficient documentation

## 2013-12-04 DIAGNOSIS — B351 Tinea unguium: Secondary | ICD-10-CM | POA: Insufficient documentation

## 2013-12-04 DIAGNOSIS — R233 Spontaneous ecchymoses: Secondary | ICD-10-CM | POA: Insufficient documentation

## 2013-12-04 DIAGNOSIS — F17200 Nicotine dependence, unspecified, uncomplicated: Secondary | ICD-10-CM | POA: Insufficient documentation

## 2013-12-04 NOTE — ED Notes (Addendum)
Pt states that she had a varicose vein that "busted" yesterday and was bleeding (bleeding controlled at this time) pt states that now she notices a swollen area to where the vein travels. Pt does not have bruising nor does she has any discoloration to the swelling, but some swelling noted under right knee. Pt also concerned about her great toe on her right foot. Pt also states her inhaler at home is not working for her. Pt using fans with no relief as well.

## 2013-12-05 ENCOUNTER — Telehealth: Payer: Self-pay | Admitting: Internal Medicine

## 2013-12-05 ENCOUNTER — Emergency Department (HOSPITAL_COMMUNITY)
Admission: EM | Admit: 2013-12-05 | Discharge: 2013-12-05 | Disposition: A | Discharge status: Home / Self Care | Payer: No Typology Code available for payment source | Attending: Emergency Medicine | Admitting: Emergency Medicine

## 2013-12-05 ENCOUNTER — Telehealth: Payer: Self-pay

## 2013-12-05 DIAGNOSIS — S8011XA Contusion of right lower leg, initial encounter: Secondary | ICD-10-CM

## 2013-12-05 DIAGNOSIS — B351 Tinea unguium: Secondary | ICD-10-CM

## 2013-12-05 DIAGNOSIS — J441 Chronic obstructive pulmonary disease with (acute) exacerbation: Secondary | ICD-10-CM

## 2013-12-05 MED ORDER — PREDNISONE 20 MG PO TABS
60.0000 mg | ORAL_TABLET | Freq: Once | ORAL | Status: AC
  Administered 2013-12-05: 60 mg via ORAL
  Filled 2013-12-05: qty 3

## 2013-12-05 MED ORDER — PREDNISONE 50 MG PO TABS: 50.0000 mg | ORAL_TABLET | Freq: Every day | ORAL | Status: DC

## 2013-12-05 MED ORDER — TERBINAFINE HCL 250 MG PO TABS: 250.0000 mg | ORAL_TABLET | Freq: Every day | ORAL | Status: DC

## 2013-12-05 MED ORDER — BENZONATATE 100 MG PO CAPS: 100.0000 mg | ORAL_CAPSULE | Freq: Three times a day (TID) | ORAL | Status: DC

## 2013-12-05 MED ORDER — ALBUTEROL SULFATE (2.5 MG/3ML) 0.083% IN NEBU
5.0000 mg | INHALATION_SOLUTION | Freq: Once | RESPIRATORY_TRACT | Status: AC
  Administered 2013-12-05: 5 mg via RESPIRATORY_TRACT
  Filled 2013-12-05: qty 6

## 2013-12-05 MED ORDER — TRAMADOL HCL 50 MG PO TABS: 50.0000 mg | ORAL_TABLET | Freq: Four times a day (QID) | ORAL | Status: DC | PRN

## 2013-12-05 MED ORDER — TRAMADOL HCL 50 MG PO TABS
50.0000 mg | ORAL_TABLET | Freq: Once | ORAL | Status: AC
  Administered 2013-12-05: 50 mg via ORAL
  Filled 2013-12-05: qty 1

## 2013-12-05 NOTE — ED Provider Notes (Signed)
CSN: [id number]     Arrival date & time 12/04/13  2221 History   First MD Initiated Contact with Patient 12/05/13 0127     Chief Complaint  Patient presents with  . Leg Pain  . Nail Problem  . Shortness of Breath     (Consider location/radiation/quality/duration/timing/severity/associated sxs/prior Treatment) HPI Patient presents with several complaints. She complains of a "burst blood vessel" around her right knee. She's had bruising and swelling associated with this. Is tender to palpation. She's had no warmth or fever. She denies any calf swelling or pain.   Patient also complains of multiple days of increased wheezing and shortness of breath. She states that the heat has exacerbated her COPD. She's had some coughing without any sputum production. She denies any chest pain.  Patient also complains of several weeks of right great toe nail thickening and discoloration. Mild tenderness at the base of the toe. She's had no foot swelling. Past Medical History  Diagnosis Date  . Arthritis   . COPD (chronic obstructive pulmonary disease)   . Hypertension   . Generalized headaches   . Leg swelling   . Abdominal pain   . Anxiety    Past Surgical History  Procedure Laterality Date  . Hernia repair  2010  . Colon surgery  2010    per patient "colon take down"   Family History  Problem Relation Age of Onset  . Heart disease Mother 2556  . Heart failure Mother   . Asthma Mother   . Alzheimer's disease Father 3667   History  Substance Use Topics  . Smoking status: Current Every Day Smoker -- 1.00 packs/day for 40 years  . Smokeless tobacco: Never Used  . Alcohol Use: No   OB History   Grav Para Term Preterm Abortions TAB SAB Ect Mult Living                 Review of Systems  Constitutional: Negative for fever and chills.  HENT: Negative for sore throat.   Respiratory: Positive for cough, shortness of breath and wheezing. Negative for chest tightness.   Cardiovascular:  Negative for chest pain, palpitations and leg swelling.  Gastrointestinal: Negative for nausea, vomiting, abdominal pain and diarrhea.  Musculoskeletal: Negative for back pain, myalgias, neck pain and neck stiffness.  Skin: Positive for rash. Negative for pallor and wound.  Neurological: Negative for dizziness, weakness, light-headedness, numbness and headaches.  All other systems reviewed and are negative.     Allergies  Ciprofloxacin; Aspirin; Codeine; and Penicillins  Home Medications   Prior to Admission medications   Medication Sig Start Date End Date Taking? Authorizing Khrystyna Schwalm  albuterol (PROVENTIL HFA;VENTOLIN HFA) 108 (90 BASE) MCG/ACT inhaler Inhale 2 puffs into the lungs every 6 (six) hours as needed for wheezing or shortness of breath. 08/28/13  Yes Deepak Advani, MD  fluticasone (FLONASE) 50 MCG/ACT nasal spray Place 2 sprays into both nostrils daily. 06/19/13  Yes Doris Cheadleeepak Advani, MD  hydrochlorothiazide (HYDRODIURIL) 25 MG tablet Take 25 mg by mouth daily.   Yes Historical Kiyoshi Schaab, MD  ibuprofen (ADVIL,MOTRIN) 200 MG tablet Take 400 mg by mouth 2 (two) times daily as needed for pain. For neck pain   Yes Historical Karlyn Glasco, MD   BP 107/53  Pulse 89  Temp(Src) 97.9 F (36.6 C) (Oral)  Resp 18  Ht 5\' 4"  (1.626 m)  Wt 165 lb (74.844 kg)  BMI 28.31 kg/m2  SpO2 98% Physical Exam  Nursing note and vitals reviewed. Constitutional: She is  oriented to person, place, and time. She appears well-developed and well-nourished. No distress.  HENT:  Head: Normocephalic and atraumatic.  Mouth/Throat: Oropharynx is clear and moist. No oropharyngeal exudate.  Eyes: EOM are normal. Pupils are equal, round, and reactive to light.  Neck: Normal range of motion. Neck supple.  Cardiovascular: Normal rate and regular rhythm.   Pulmonary/Chest: Effort normal. No respiratory distress. She has wheezes (mild end expiratory wheezing diffusely.). She has no rales.  Abdominal: Soft. Bowel sounds  are normal. She exhibits no distension and no mass. There is no tenderness. There is no rebound and no guarding.  Musculoskeletal: Normal range of motion. She exhibits tenderness. She exhibits no edema.  Patient with tenderness over the medial surface of her right leg at the level just inferior to the knee. There is mild bruising and associated swelling. There is no erythema. Patient has no calf tenderness. Patient also noted to have thickened discolored nail of the great toe of the right foot. There is mild surrounding erythema at the nail bed. There is no fluctuance. Patient has good distal pulses.  Neurological: She is alert and oriented to person, place, and time.  5/5 motor in all extremities. Sensation is grossly intact.  Skin: Skin is warm and dry. No rash noted. No erythema.  Psychiatric: She has a normal mood and affect. Her behavior is normal.    ED Course  Procedures (including critical care time) Labs Review Labs Reviewed - No data to display  Imaging Review No results found.   EKG Interpretation None      MDM   Final diagnoses:  None   Exam consistent with onychomycosis of the right great toe nail and superficial venous injury with extravasation and bruising. Mild wheezing. Advised patient to ice on the swelling of her leg and keep elevated. We'll start a medication for her toenail infection. Will give a breathing treatment in the emergency department but anticipate discharge home.     Loren Raceravid Yelverton, MD 12/05/13 [phone]

## 2013-12-05 NOTE — ED Notes (Signed)
Patient presents with several complaints.  Blood vessel on the right leg below the knee broke and bruising area noted around the leg.  States that it burns.  Also concerned about toenail on the right great toe.  Toenail yellow.

## 2013-12-05 NOTE — Telephone Encounter (Signed)
Pt. Came in today inquiring about a Colonoscopy referral she needed and asked for but has not been made.Marland Kitchen.Marland Kitchen.I informed patient of her appt. coming up on 7/8 and stated she can ask her doctor about referring her to get this procedure done...Marland Kitchen..Marland Kitchen

## 2013-12-05 NOTE — Discharge Instructions (Signed)
Chronic Obstructive Pulmonary Disease Chronic obstructive pulmonary disease (COPD) is a common lung condition in which airflow from the lungs is limited. COPD is a general term that can be used to describe many different lung problems that limit airflow, including both chronic bronchitis and emphysema. If you have COPD, your lung function will probably never return to normal, but there are measures you can take to improve lung function and make yourself feel better.  CAUSES   Smoking (common).   Exposure to secondhand smoke.   Genetic problems.  Chronic inflammatory lung diseases or recurrent infections. SYMPTOMS   Shortness of breath, especially with physical activity.   Deep, persistent (chronic) cough with a large amount of thick mucus.   Wheezing.   Rapid breaths (tachypnea).   Gray or bluish discoloration (cyanosis) of the skin, especially in fingers, toes, or lips.   Fatigue.   Weight loss.   Frequent infections or episodes when breathing symptoms become much worse (exacerbations).   Chest tightness. DIAGNOSIS  Your healthcare provider will take a medical history and perform a physical examination to make the initial diagnosis. Additional tests for COPD may include:   Lung (pulmonary) function tests.  Chest X-ray.  CT scan.  Blood tests. TREATMENT  Treatment available to help you feel better when you have COPD include:   Inhaler and nebulizer medicines. These help manage the symptoms of COPD and make your breathing more comfortable  Supplemental oxygen. Supplemental oxygen is only helpful if you have a low oxygen level in your blood.   Exercise and physical activity. These are beneficial for nearly all people with COPD. Some people may also benefit from a pulmonary rehabilitation program. HOME CARE INSTRUCTIONS   Take all medicines (inhaled or pills) as directed by your health care provider.  Only take over-the-counter or prescription medicines  for pain, fever, or discomfort as directed by your health care provider.   Avoid over-the-counter medicines or cough syrups that dry up your airway (such as antihistamines) and slow down the elimination of secretions unless instructed otherwise by your healthcare provider.   If you are a smoker, the most important thing that you can do is stop smoking. Continuing to smoke will cause further lung damage and breathing trouble. Ask your health care provider for help with quitting smoking. He or she can direct you to community resources or hospitals that provide support.  Avoid exposure to irritants such as smoke, chemicals, and fumes that aggravate your breathing.  Use oxygen therapy and pulmonary rehabilitation if directed by your health care provider. If you require home oxygen therapy, ask your healthcare provider whether you should purchase a pulse oximeter to measure your oxygen level at home.   Avoid contact with individuals who have a contagious illness.  Avoid extreme temperature and humidity changes.  Eat healthy foods. Eating smaller, more frequent meals and resting before meals may help you maintain your strength.  Stay active, but balance activity with periods of rest. Exercise and physical activity will help you maintain your ability to do things you want to do.  Preventing infection and hospitalization is very important when you have COPD. Make sure to receive all the vaccines your health care provider recommends, especially the pneumococcal and influenza vaccines. Ask your healthcare provider whether you need a pneumonia vaccine.  Learn and use relaxation techniques to manage stress.  Learn and use controlled breathing techniques as directed by your health care provider. Controlled breathing techniques include:   Pursed lip breathing. Start by breathing   in (inhaling) through your nose for 1 second. Then, purse your lips as if you were going to whistle and breathe out (exhale)  through the pursed lips for 2 seconds.   Diaphragmatic breathing. Start by putting one hand on your abdomen just above your waist. Inhale slowly through your nose. The hand on your abdomen should move out. Then purse your lips and exhale slowly. You should be able to feel the hand on your abdomen moving in as you exhale.   Learn and use controlled coughing to clear mucus from your lungs. Controlled coughing is a series of short, progressive coughs. The steps of controlled coughing are:  1. Lean your head slightly forward.  2. Breathe in deeply using diaphragmatic breathing.  3. Try to hold your breath for 3 seconds.  4. Keep your mouth slightly open while coughing twice.  5. Spit any mucus out into a tissue.  6. Rest and repeat the steps once or twice as needed. SEEK MEDICAL CARE IF:   You are coughing up more mucus than usual.   There is a change in the color or thickness of your mucus.   Your breathing is more labored than usual.   Your breathing is faster than usual.  SEEK IMMEDIATE MEDICAL CARE IF:   You have shortness of breath while you are resting.   You have shortness of breath that prevents you from:  Being able to talk.   Performing your usual physical activities.   You have chest pain lasting longer than 5 minutes.   Your skin color is more cyanotic than usual.  You measure low oxygen saturations for longer than 5 minutes with a pulse oximeter. MAKE SURE YOU:   Understand these instructions.  Will watch your condition.  Will get help right away if you are not doing well or get worse. Document Released: 03/10/2005 Document Revised: 03/21/2013 Document Reviewed: 01/25/2013 [facility]Exit[facility] Patient Information 2015 [place], [place]. This information is not intended to replace advice given to you by your health care provider. Make sure you discuss any questions you have with your health care provider.  Contusion A contusion is a deep bruise. Contusions are  the result of an injury that caused bleeding under the skin. The contusion may turn blue, purple, or yellow. Minor injuries will give you a painless contusion, but more severe contusions may stay painful and swollen for a few weeks.  CAUSES  A contusion is usually caused by a blow, trauma, or direct force to an area of the body. SYMPTOMS   Swelling and redness of the injured area.  Bruising of the injured area.  Tenderness and soreness of the injured area.  Pain. DIAGNOSIS  The diagnosis can be made by taking a history and physical exam. An X-ray, CT scan, or MRI may be needed to determine if there were any associated injuries, such as fractures. TREATMENT  Specific treatment will depend on what area of the body was injured. In general, the best treatment for a contusion is resting, icing, elevating, and applying cold compresses to the injured area. Over-the-counter medicines may also be recommended for pain control. Ask your caregiver what the best treatment is for your contusion. HOME CARE INSTRUCTIONS   Put ice on the injured area.  Put ice in a plastic bag.  Place a towel between your skin and the bag.  Leave the ice on for 15-20 minutes, 3-4 times a day, or as directed by your health care provider.  Only take over-the-counter or prescription  medicines for pain, discomfort, or fever as directed by your caregiver. Your caregiver may recommend avoiding anti-inflammatory medicines (aspirin, ibuprofen, and naproxen) for 48 hours because these medicines may increase bruising.  Rest the injured area.  If possible, elevate the injured area to reduce swelling. SEEK IMMEDIATE MEDICAL CARE IF:   You have increased bruising or swelling.  You have pain that is getting worse.  Your swelling or pain is not relieved with medicines. MAKE SURE YOU:   Understand these instructions.  Will watch your condition.  Will get help right away if you are not doing well or get worse. Document  Released: 03/10/2005 Document Revised: 06/05/2013 Document Reviewed: 04/05/2011 [facility] DisabilitiesExitCare Patient Information 2015 [place], [place]. This information is not intended to replace advice given to you by your health care provider. Make sure you discuss any questions you have with your health care provider.

## 2013-12-19 ENCOUNTER — Encounter: Payer: Self-pay | Admitting: Internal Medicine

## 2013-12-19 ENCOUNTER — Ambulatory Visit: Payer: No Typology Code available for payment source | Attending: Internal Medicine | Admitting: Internal Medicine

## 2013-12-19 VITALS — BP 126/85 | HR 82 | Temp 98.3°F | Resp 16 | Wt 158.2 lb

## 2013-12-19 DIAGNOSIS — I10 Essential (primary) hypertension: Secondary | ICD-10-CM | POA: Insufficient documentation

## 2013-12-19 DIAGNOSIS — N898 Other specified noninflammatory disorders of vagina: Secondary | ICD-10-CM | POA: Insufficient documentation

## 2013-12-19 DIAGNOSIS — B351 Tinea unguium: Secondary | ICD-10-CM

## 2013-12-19 DIAGNOSIS — IMO0001 Reserved for inherently not codable concepts without codable children: Secondary | ICD-10-CM | POA: Insufficient documentation

## 2013-12-19 DIAGNOSIS — Z139 Encounter for screening, unspecified: Secondary | ICD-10-CM

## 2013-12-19 DIAGNOSIS — F17200 Nicotine dependence, unspecified, uncomplicated: Secondary | ICD-10-CM

## 2013-12-19 LAB — POCT URINALYSIS DIPSTICK
Bilirubin, UA: NEGATIVE
Blood, UA: NEGATIVE
Glucose, UA: NEGATIVE
Ketones, UA: NEGATIVE
LEUKOCYTES UA: NEGATIVE
NITRITE UA: NEGATIVE
Protein, UA: NEGATIVE
Spec Grav, UA: 1.02
UROBILINOGEN UA: 0.2
pH, UA: 6.5

## 2013-12-19 LAB — CBC WITH DIFFERENTIAL/PLATELET
Basophils Absolute: 0.1 10*3/uL (ref 0.0–0.1)
Basophils Relative: 1 % (ref 0–1)
EOS ABS: 0.4 10*3/uL (ref 0.0–0.7)
EOS PCT: 4 % (ref 0–5)
HCT: 44.2 % (ref 36.0–46.0)
Hemoglobin: 15.3 g/dL — ABNORMAL HIGH (ref 12.0–15.0)
Lymphocytes Relative: 28 % (ref 12–46)
Lymphs Abs: 2.7 10*3/uL (ref 0.7–4.0)
MCH: 28.6 pg (ref 26.0–34.0)
MCHC: 34.6 g/dL (ref 30.0–36.0)
MCV: 82.6 fL (ref 78.0–100.0)
MONOS PCT: 9 % (ref 3–12)
Monocytes Absolute: 0.9 10*3/uL (ref 0.1–1.0)
Neutro Abs: 5.6 10*3/uL (ref 1.7–7.7)
Neutrophils Relative %: 58 % (ref 43–77)
PLATELETS: 331 10*3/uL (ref 150–400)
RBC: 5.35 MIL/uL — AB (ref 3.87–5.11)
RDW: 14.9 % (ref 11.5–15.5)
WBC: 9.7 10*3/uL (ref 4.0–10.5)

## 2013-12-19 MED ORDER — FLUCONAZOLE 150 MG PO TABS: 150.0000 mg | ORAL_TABLET | Freq: Once | ORAL | Status: DC

## 2013-12-19 NOTE — Progress Notes (Signed)
Patient states she is still having some vaginal discharge Still having a problem with her right great toe Not sure if she should still be using anti-fungal medication

## 2013-12-19 NOTE — Progress Notes (Signed)
MRN: [id number] Name: Maria Oliver  Sex: female Age: 53 y.o. DOB: Feb 07, 1961  Allergies: Ciprofloxacin; Aspirin; Codeine; and Penicillins  Chief Complaint  Patient presents with  . Vaginal Discharge    HPI: Patient is 53 y.o. female who has history of hypertension, comes today for followup, 2 months ago she went to the emergency room and was treated for bronchitis symptoms and was prescribed prednisone as per patient it helped her with the symptoms, she was also prescribed Lamisil for her toenail fungus which she has not started yet, she still complained of some vaginal discharge denies any dysuria, her urine is negative for infection. [facility] and has been prescribed Paxil for anxiety/depression.  Past Medical History  Diagnosis Date  . Arthritis   . COPD (chronic obstructive pulmonary disease)   . Hypertension   . Generalized headaches   . Leg swelling   . Abdominal pain   . Anxiety     Past Surgical History  Procedure Laterality Date  . Hernia repair  2010  . Colon surgery  2010    per patient "colon take down"      Medication List       This list is accurate as of: 12/19/13  4:41 PM.  Always use your most recent med list.               albuterol 108 (90 BASE) MCG/ACT inhaler  Commonly known as:  PROVENTIL HFA;VENTOLIN HFA  Inhale 2 puffs into the lungs every 6 (six) hours as needed for wheezing or shortness of breath.     benzonatate 100 MG capsule  Commonly known as:  TESSALON  Take 1 capsule (100 mg total) by mouth every 8 (eight) hours.     fluconazole 150 MG tablet  Commonly known as:  DIFLUCAN  Take 1 tablet (150 mg total) by mouth once.     fluticasone 50 MCG/ACT nasal spray  Commonly known as:  FLONASE  Place 2 sprays into both nostrils daily.     hydrochlorothiazide 25 MG tablet  Commonly known as:  HYDRODIURIL  Take 25 mg by mouth daily.     ibuprofen 200 MG tablet  Commonly known as:  ADVIL,MOTRIN  Take 400  mg by mouth 2 (two) times daily as needed for pain. For neck pain     predniSONE 50 MG tablet  Commonly known as:  DELTASONE  Take 1 tablet (50 mg total) by mouth daily.     terbinafine 250 MG tablet  Commonly known as:  LAMISIL  Take 1 tablet (250 mg total) by mouth daily. For 12 weeks     traMADol 50 MG tablet  Commonly known as:  ULTRAM  Take 1 tablet (50 mg total) by mouth every 6 (six) hours as needed.        Meds ordered this encounter  Medications  . fluconazole (DIFLUCAN) 150 MG tablet    Sig: Take 1 tablet (150 mg total) by mouth once.    Dispense:  1 tablet    Refill:  0    Immunization History  Administered Date(s) Administered  . Tdap 10/07/2013    Family History  Problem Relation Age of Onset  . Heart disease Mother 2456  . Heart failure Mother   . Asthma Mother   . Alzheimer's disease Father 1967    History  Substance Use Topics  . Smoking status: Current Every Day Smoker -- 1.00 packs/day for 40 years  . Smokeless  tobacco: Never Used  . Alcohol Use: No    Review of Systems   As noted in HPI  Filed Vitals:   12/19/13 1623  BP: 126/85  Pulse: 82  Temp: 98.3 F (36.8 C)  Resp: 16    Physical Exam  Physical Exam  Constitutional: No distress.  Eyes: EOM are normal. Pupils are equal, round, and reactive to light.  Cardiovascular: Normal rate and regular rhythm.   Pulmonary/Chest: Breath sounds normal. No respiratory distress. She has no wheezes. She has no rales.  Musculoskeletal:  Right foot big toe onychomycosis    CBC    Component Value Date/Time   WBC 8.5 05/09/2013 1445   RBC 5.36* 05/09/2013 1445   HGB 15.5* 05/09/2013 1445   HCT 45.6 05/09/2013 1445   PLT 368 05/09/2013 1445   MCV 85.1 05/09/2013 1445   LYMPHSABS 3.1 05/09/2013 1445   MONOABS 0.7 05/09/2013 1445   EOSABS 0.3 05/09/2013 1445   BASOSABS 0.0 05/09/2013 1445    CMP     Component Value Date/Time   NA 139 05/09/2013 1445   K 4.1 05/09/2013 1445   CL 103  05/09/2013 1445   CO2 30 05/09/2013 1445   GLUCOSE 78 05/09/2013 1445   BUN 7 05/09/2013 1445   CREATININE 0.69 05/09/2013 1445   CREATININE 0.70 10/20/2012 1513   CALCIUM 9.9 05/09/2013 1445   PROT 7.4 05/09/2013 1445   ALBUMIN 4.5 05/09/2013 1445   AST 16 05/09/2013 1445   ALT 14 05/09/2013 1445   ALKPHOS 77 05/09/2013 1445   BILITOT 0.4 05/09/2013 1445   GFRNONAA >89 05/09/2013 1445   GFRNONAA >90 10/20/2012 1513   GFRAA >89 05/09/2013 1445   GFRAA >90 10/20/2012 1513    Lab Results  Component Value Date/Time   CHOL 221* 05/09/2013  2:45 PM    No components found with this basename: hga1c    Lab Results  Component Value Date/Time   AST 16 05/09/2013  2:45 PM    Assessment and Plan  Vaginal discharge - Plan: Urinalysis Dipstick. Results for orders placed in visit on 12/19/13  POCT URINALYSIS DIPSTICK      Result Value Ref Range   Color, UA yellow     Clarity, UA clear     Glucose, UA neg     Bilirubin, UA neg     Ketones, UA neg     Spec Grav, UA 1.020     Blood, UA neg     pH, UA 6.5     Protein, UA neg     Urobilinogen, UA 0.2     Nitrite, UA neg     Leukocytes, UA Negative     Urine is negative for infection. , Ambulatory referral to Gynecology, fluconazole (DIFLUCAN) 150 MG tablet  Essential hypertension, benign - Plan: Blood pressure is well controlled continue with hydrochlorothiazide we'll recheck blood chemistry COMPLETE METABOLIC PANEL WITH GFR  Onychomycosis - Plan: She is going to start Lamisil will check CBC with Differential as well as LFTs.  Smoking Advised patient to quit smoking.  Screening - Plan: MM DIGITAL SCREENING BILATERAL   Health Maintenance -Colonoscopy: patient had one 5 years ago she will schedule apt with her gastroenterologist -Pap Smear: referred to GYN -Mammogram: ordered    Return in about 3 months (around 03/21/2014) for hypertension.  Doris CheadleADVANI, Kinzlee Selvy, MD

## 2013-12-20 ENCOUNTER — Encounter: Payer: Self-pay | Admitting: Family Medicine

## 2013-12-20 ENCOUNTER — Telehealth: Payer: Self-pay

## 2013-12-20 LAB — COMPLETE METABOLIC PANEL WITH GFR
ALK PHOS: 66 U/L (ref 39–117)
ALT: 10 U/L (ref 0–35)
AST: 14 U/L (ref 0–37)
Albumin: 4.4 g/dL (ref 3.5–5.2)
BUN: 10 mg/dL (ref 6–23)
CALCIUM: 10 mg/dL (ref 8.4–10.5)
CHLORIDE: 102 meq/L (ref 96–112)
CO2: 30 mEq/L (ref 19–32)
CREATININE: 0.71 mg/dL (ref 0.50–1.10)
GFR, Est African American: >89 mL/min
GFR, Est Non African American: >89 mL/min
Glucose, Bld: 87 mg/dL (ref 70–99)
POTASSIUM: 3.5 meq/L (ref 3.5–5.3)
Sodium: 138 mEq/L (ref 135–145)
Total Bilirubin: 0.4 mg/dL (ref 0.2–1.2)
Total Protein: 6.9 g/dL (ref 6.0–8.3)

## 2013-12-20 NOTE — Telephone Encounter (Signed)
Spoke with patient and she is aware of her lab results 

## 2013-12-20 NOTE — Telephone Encounter (Signed)
Message copied by Lestine MountJUAREZ, Lindley Hiney L on Thu Dec 20, 2013 12:44 PM ------      Message from: Doris CheadleADVANI, DEEPAK      Created: Thu Dec 20, 2013 11:53 AM       Call and let the patient know that her blood chemistry and liver function is normal. ------

## 2013-12-25 ENCOUNTER — Encounter: Payer: Self-pay | Admitting: Internal Medicine

## 2014-01-07 ENCOUNTER — Ambulatory Visit (INDEPENDENT_AMBULATORY_CARE_PROVIDER_SITE_OTHER): Payer: No Typology Code available for payment source | Admitting: Family Medicine

## 2014-01-07 ENCOUNTER — Encounter: Payer: Self-pay | Admitting: Family Medicine

## 2014-01-07 VITALS — BP 123/74 | HR 57 | Temp 98.1°F | Ht 65.0 in | Wt 155.5 lb

## 2014-01-07 DIAGNOSIS — Z1151 Encounter for screening for human papillomavirus (HPV): Secondary | ICD-10-CM

## 2014-01-07 DIAGNOSIS — Z1159 Encounter for screening for other viral diseases: Secondary | ICD-10-CM

## 2014-01-07 DIAGNOSIS — Z118 Encounter for screening for other infectious and parasitic diseases: Secondary | ICD-10-CM

## 2014-01-07 DIAGNOSIS — N898 Other specified noninflammatory disorders of vagina: Secondary | ICD-10-CM

## 2014-01-07 DIAGNOSIS — Z01419 Encounter for gynecological examination (general) (routine) without abnormal findings: Secondary | ICD-10-CM

## 2014-01-07 DIAGNOSIS — Z124 Encounter for screening for malignant neoplasm of cervix: Secondary | ICD-10-CM

## 2014-01-07 NOTE — Progress Notes (Signed)
Subjective:     Patient ID: Maria Oliver, female   DOB: 10-16-1960, 53 y.o.   MRN: [id number]  HPI  [id number] Menarche: age 53 Contraception: BTL Menopause: 43 Supplements: does not take calcium, occasionally takes MV GU: +urinary incontinence with sneezing/cough x 1 year, occasional urge incontinence STD: hx of chlamydia several years ago Pap: last 5 years ago, possible hx of abn previously.  Vaginal discharge: x 6 months, feels right labia gets swollen and sore from discharge.  Has taken yeast medications with no improvement.  Past Medical History  Diagnosis Date  . Arthritis   . COPD (chronic obstructive pulmonary disease)   . Hypertension   . Generalized headaches   . Leg swelling   . Abdominal pain   . Anxiety     Review of Systems  Constitutional: Negative for chills, diaphoresis and fatigue.  Respiratory: Positive for cough and shortness of breath.   Cardiovascular: Negative for leg swelling.  Gastrointestinal: Negative for abdominal pain, diarrhea and constipation.  Genitourinary: Positive for frequency. Negative for dysuria and urgency.       Incontinence  Musculoskeletal: Negative for back pain and gait problem.  Neurological: Negative for headaches.       Objective:   Physical Exam  Constitutional: She appears well-developed and well-nourished. No distress.  HENT:  Head: Normocephalic and atraumatic.  Eyes: Conjunctivae are normal. Pupils are equal, round, and reactive to light.  Neck: Normal range of motion. Neck supple. No thyromegaly present.  Cardiovascular: Normal rate, regular rhythm and normal heart sounds.   Pulmonary/Chest: Effort normal. No respiratory distress.  Abdominal: Soft. She exhibits no distension. There is no tenderness.  Genitourinary: Vagina normal and uterus normal. No vaginal discharge found.  +cystocele  Musculoskeletal: She exhibits no edema.  Skin: Skin is warm and dry. No rash noted. She is not diaphoretic. No erythema.   Psychiatric: She has a normal mood and affect. Her behavior is normal.       Assessment:     53 yo female with multiple comorbidities here for well woman and complaints of vaginal discharge     Plan:     Pap, has mammogram ordered, colon cancer screening per PCP,  AFFIRM sent for further eval of discharge however no discharge noted today. Briefly discussed cystocele, pt given handout on pelvic floor disorders/treatments; declines at this time.

## 2014-01-07 NOTE — Patient Instructions (Addendum)
Place cystocele, rectocele, and pessary patient instructions here.  

## 2014-01-09 LAB — CYTOLOGY - PAP

## 2014-02-13 ENCOUNTER — Ambulatory Visit (AMBULATORY_SURGERY_CENTER): Payer: Self-pay | Admitting: *Deleted

## 2014-02-13 ENCOUNTER — Telehealth: Payer: Self-pay | Admitting: *Deleted

## 2014-02-13 VITALS — Ht 64.0 in | Wt 155.0 lb

## 2014-02-13 DIAGNOSIS — Z860100 Personal history of colon polyps, unspecified: Secondary | ICD-10-CM

## 2014-02-13 NOTE — Telephone Encounter (Signed)
Patient is here in pre-visit, colonoscopy was cancelled at this time, due to she does not have any records. She states her last colonoscopy was in 2010 with Dr.Hung. Patient states he removed 2 polyps, she went home fine, then started having severe abdominal pain, went to ER, woke up with colostomy bag. States she wore that bag for about 6 months and it has been reversed. Patient unsure what happened during colonoscopy procedure. Patient signed release of information form and that was given to [facility]. Told patient that we will call her once Dr.Pyrtle reviews records. Patient is very anxious about having colonoscopy. [employer] instructions given and "free" coupon given to patient at this time. Pre-visit done also. [employer]

## 2014-02-13 NOTE — Progress Notes (Signed)
Patient denies any allergies to soy. Patient states she can not eat eggs, vomit and gets dizzy and sick. Patient is able to eat cake with eggs in them. Patient denies any problems with anesthesia/sedation. Patient denies any oxygen use at home and does not take any diet/weight loss medications. EMMI education assisgned to patient on colonoscopy, this was explained and instructions given to patient. Last colonoscopy 2010 with Dr.Hung, had colostomy afterwards, release of information form signed by patient. Given to [facility].

## 2014-02-14 NOTE — Telephone Encounter (Signed)
ROI was sent to Dr Haywood Pao office yesterday. We are awaiting records.

## 2014-02-20 ENCOUNTER — Ambulatory Visit: Payer: No Typology Code available for payment source | Admitting: Internal Medicine

## 2014-02-27 ENCOUNTER — Encounter: Payer: No Typology Code available for payment source | Admitting: Internal Medicine

## 2014-02-27 NOTE — Telephone Encounter (Signed)
Left message for patient to call back. Dr Rhea Belton has reviewed notes from Dr Elnoria Howard. He states "records reviewed. 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 Perforation requiring surgery. Now overdue for polyps surveillance. I rec. Colonoscopy. Okay for office visit if patient would prefer 1st."

## 2014-03-01 NOTE — Telephone Encounter (Signed)
Left message for patient to call back  

## 2014-03-04 NOTE — Telephone Encounter (Signed)
Patient states that she would prefer to see Dr Rhea Belton in the office first. I have scheduled her for his first available opening on 05/08/14. She verbalizes understanding.

## 2014-05-08 ENCOUNTER — Ambulatory Visit: Payer: No Typology Code available for payment source | Admitting: Internal Medicine

## 2014-06-19 ENCOUNTER — Emergency Department (HOSPITAL_COMMUNITY)
Admission: EM | Admit: 2014-06-19 | Discharge: 2014-06-19 | Disposition: A | Discharge status: Home / Self Care | Payer: Self-pay | Attending: Emergency Medicine | Admitting: Emergency Medicine

## 2014-06-19 ENCOUNTER — Encounter (HOSPITAL_COMMUNITY): Payer: Self-pay | Admitting: *Deleted

## 2014-06-19 ENCOUNTER — Emergency Department (HOSPITAL_COMMUNITY): Payer: No Typology Code available for payment source

## 2014-06-19 DIAGNOSIS — Z8739 Personal history of other diseases of the musculoskeletal system and connective tissue: Secondary | ICD-10-CM | POA: Insufficient documentation

## 2014-06-19 DIAGNOSIS — I10 Essential (primary) hypertension: Secondary | ICD-10-CM | POA: Insufficient documentation

## 2014-06-19 DIAGNOSIS — Z720 Tobacco use: Secondary | ICD-10-CM | POA: Insufficient documentation

## 2014-06-19 DIAGNOSIS — K4090 Unilateral inguinal hernia, without obstruction or gangrene, not specified as recurrent: Secondary | ICD-10-CM | POA: Insufficient documentation

## 2014-06-19 DIAGNOSIS — Z79899 Other long term (current) drug therapy: Secondary | ICD-10-CM | POA: Insufficient documentation

## 2014-06-19 DIAGNOSIS — Z880 Allergy status to penicillin: Secondary | ICD-10-CM | POA: Insufficient documentation

## 2014-06-19 DIAGNOSIS — M1990 Unspecified osteoarthritis, unspecified site: Secondary | ICD-10-CM | POA: Insufficient documentation

## 2014-06-19 DIAGNOSIS — B349 Viral infection, unspecified: Secondary | ICD-10-CM | POA: Insufficient documentation

## 2014-06-19 DIAGNOSIS — J449 Chronic obstructive pulmonary disease, unspecified: Secondary | ICD-10-CM | POA: Insufficient documentation

## 2014-06-19 DIAGNOSIS — Z7951 Long term (current) use of inhaled steroids: Secondary | ICD-10-CM | POA: Insufficient documentation

## 2014-06-19 DIAGNOSIS — F419 Anxiety disorder, unspecified: Secondary | ICD-10-CM | POA: Insufficient documentation

## 2014-06-19 LAB — URINALYSIS, ROUTINE W REFLEX MICROSCOPIC
Glucose, UA: NEGATIVE mg/dL
Ketones, ur: 15 mg/dL — AB
Nitrite: NEGATIVE
Protein, ur: 100 mg/dL — AB
Specific Gravity, Urine: 1.028 (ref 1.005–1.030)
Urobilinogen, UA: 1 mg/dL (ref 0.0–1.0)
pH: 6 (ref 5.0–8.0)

## 2014-06-19 LAB — CBC WITH DIFFERENTIAL/PLATELET
BASOS ABS: 0 10*3/uL (ref 0.0–0.1)
Basophils Relative: 0 % (ref 0–1)
Eosinophils Absolute: 0 10*3/uL (ref 0.0–0.7)
Eosinophils Relative: 0 % (ref 0–5)
HCT: 42.9 % (ref 36.0–46.0)
Hemoglobin: 14.4 g/dL (ref 12.0–15.0)
Lymphocytes Relative: 10 % — ABNORMAL LOW (ref 12–46)
Lymphs Abs: 1.6 10*3/uL (ref 0.7–4.0)
MCH: 28.5 pg (ref 26.0–34.0)
MCHC: 33.6 g/dL (ref 30.0–36.0)
MCV: 85 fL (ref 78.0–100.0)
Monocytes Absolute: 1 10*3/uL (ref 0.1–1.0)
Monocytes Relative: 6 % (ref 3–12)
Neutro Abs: 13.2 10*3/uL — ABNORMAL HIGH (ref 1.7–7.7)
Neutrophils Relative %: 84 % — ABNORMAL HIGH (ref 43–77)
PLATELETS: 288 10*3/uL (ref 150–400)
RBC: 5.05 MIL/uL (ref 3.87–5.11)
RDW: 14.6 % (ref 11.5–15.5)
WBC: 15.7 10*3/uL — ABNORMAL HIGH (ref 4.0–10.5)

## 2014-06-19 LAB — COMPREHENSIVE METABOLIC PANEL
ALBUMIN: 3.9 g/dL (ref 3.5–5.2)
ALT: 12 U/L (ref 0–35)
AST: 17 U/L (ref 0–37)
Alkaline Phosphatase: 75 U/L (ref 39–117)
Anion gap: 10 (ref 5–15)
BUN: 8 mg/dL (ref 6–23)
CO2: 26 mmol/L (ref 19–32)
Calcium: 9.4 mg/dL (ref 8.4–10.5)
Chloride: 106 mEq/L (ref 96–112)
Creatinine, Ser: 0.73 mg/dL (ref 0.50–1.10)
GFR calc non Af Amer: >90 mL/min (ref >90)
Glucose, Bld: 96 mg/dL (ref 70–99)
Potassium: 3.6 mmol/L (ref 3.5–5.1)
Sodium: 142 mmol/L (ref 135–145)
Total Bilirubin: 0.6 mg/dL (ref 0.3–1.2)
Total Protein: 6.8 g/dL (ref 6.0–8.3)

## 2014-06-19 LAB — URINE MICROSCOPIC-ADD ON

## 2014-06-19 MED ORDER — PREDNISONE (PAK) 10 MG PO TABS: ORAL_TABLET | Freq: Every day | ORAL | Status: DC

## 2014-06-19 MED ORDER — ONDANSETRON HCL 4 MG/2ML IJ SOLN
4.0000 mg | Freq: Once | INTRAMUSCULAR | Status: AC
  Administered 2014-06-19: 4 mg via INTRAVENOUS
  Filled 2014-06-19: qty 2

## 2014-06-19 MED ORDER — ALBUTEROL SULFATE HFA 108 (90 BASE) MCG/ACT IN AERS: 1.0000 | INHALATION_SPRAY | Freq: Four times a day (QID) | RESPIRATORY_TRACT | Status: DC | PRN

## 2014-06-19 MED ORDER — ONDANSETRON HCL 4 MG PO TABS: 4.0000 mg | ORAL_TABLET | Freq: Three times a day (TID) | ORAL | Status: DC | PRN

## 2014-06-19 MED ORDER — MORPHINE SULFATE 4 MG/ML IJ SOLN
4.0000 mg | Freq: Once | INTRAMUSCULAR | Status: AC
  Administered 2014-06-19: 4 mg via INTRAVENOUS
  Filled 2014-06-19: qty 1

## 2014-06-19 MED ORDER — BENZONATATE 100 MG PO CAPS: 100.0000 mg | ORAL_CAPSULE | Freq: Three times a day (TID) | ORAL | Status: DC

## 2014-06-19 MED ORDER — IBUPROFEN 600 MG PO TABS: 600.0000 mg | ORAL_TABLET | Freq: Three times a day (TID) | ORAL | Status: DC | PRN

## 2014-06-19 MED ORDER — IPRATROPIUM-ALBUTEROL 0.5-2.5 (3) MG/3ML IN SOLN
3.0000 mL | Freq: Once | RESPIRATORY_TRACT | Status: AC
  Administered 2014-06-19: 3 mL via RESPIRATORY_TRACT
  Filled 2014-06-19: qty 3

## 2014-06-19 MED ORDER — SODIUM CHLORIDE 0.9 % IV BOLUS (SEPSIS)
1000.0000 mL | Freq: Once | INTRAVENOUS | Status: AC
  Administered 2014-06-19: 1000 mL via INTRAVENOUS

## 2014-06-19 NOTE — ED Notes (Signed)
Patient keeps taking BP cuff off stating that she does not like it.

## 2014-06-19 NOTE — ED Notes (Signed)
Pt in c/o n/v/d, also cough and congestion, also lower abd pain from her hernia, also states that she fell off a ladder and landed on her butt three days ago, denies injuries from that

## 2014-06-19 NOTE — ED Notes (Signed)
RN transported patient back to room from triage. Pt denies wheelchair sts "I need a room now! There is no one in this waiting room and I know you guys take other people before me." RN explained she is taking patient to a room to be seen now and apologetic for patients wait. Pt sts "I know how yall do, I'm not afraid to cause a scene." RN apologetic for past bad experiences and patient given gown and warm blanket.  Pt has steady gait, with no limp or difficulty. Pt. In NAD, able to move all extremities without difficulty.

## 2014-06-19 NOTE — ED Notes (Signed)
Patient finished water and given sprite.

## 2014-06-19 NOTE — ED Provider Notes (Signed)
CSN: [id number]     Arrival date & time 06/19/14  1019 History   First MD Initiated Contact with Patient 06/19/14 1057     Chief Complaint  Patient presents with  . N/V/D    . Cough     (Consider location/radiation/quality/duration/timing/severity/associated sxs/prior Treatment) The history is provided by the patient.     Pt presents with multiple complaints.  Patient has been sick for three days with nasal congestion, sore throat, cough productive of yellow sputum, SOB, chest soreness, general myalgias, nausea, vomiting, diarrhea, also with burning with urination.  Has taken two ibuprofen without relief of her symptoms.  Son was sick last week with URI.  Emesis and diarrhea are defined as mucousy.    Also notes she had hx vertigo and was having her typical symptoms causing her to fall off a ladder three days ago onto back and buttocks.  Denies head injury or LOC.  Has diffuse tenderness over her lower back.  Also notes that she has had increased pain in her abdominal hernia over the past few days.     Past Medical History  Diagnosis Date  . Arthritis   . COPD (chronic obstructive pulmonary disease)   . Hypertension   . Generalized headaches   . Leg swelling   . Abdominal pain   . Anxiety   . Hernia, inguinal, right    Past Surgical History  Procedure Laterality Date  . Hernia repair  2010  . Colon surgery  2010    per patient "colon take down"  . Colostomy  2010  . Tubal ligation     Family History  Problem Relation Age of Onset  . Heart disease Mother [id number]  . Heart failure Mother   . Asthma Mother   . Alzheimer's disease Father [id number]  . Colon cancer Neg Hx    History  Substance Use Topics  . Smoking status: Current Every Day Smoker -- 1.00 packs/day for 40 years    Types: Cigarettes  . Smokeless tobacco: Never Used  . Alcohol Use: No   OB History    No data available     Review of Systems  All other systems reviewed and are negative.     Allergies   Ciprofloxacin; Aspirin; Codeine; and Penicillins  Home Medications   Prior to Admission medications   Medication Sig Start Date End Date Taking? Authorizing Provider  albuterol (PROVENTIL HFA;VENTOLIN HFA) 108 (90 BASE) MCG/ACT inhaler Inhale 2 puffs into the lungs every 6 (six) hours as needed for wheezing or shortness of breath. 08/28/13  Yes Deepak Advani, MD  fluticasone (FLONASE) 50 MCG/ACT nasal spray Place 2 sprays into both nostrils daily. 06/19/13  Yes Doris Cheadleeepak Advani, MD  hydrochlorothiazide (HYDRODIURIL) 25 MG tablet Take 25 mg by mouth daily.   Yes Historical Provider, MD  ibuprofen (ADVIL,MOTRIN) 200 MG tablet Take 400 mg by mouth 2 (two) times daily as needed for pain. For neck pain   Yes Historical Provider, MD  PARoxetine (PAXIL) 20 MG tablet Take 20 mg by mouth daily.   Yes Historical Provider, MD  benzonatate (TESSALON) 100 MG capsule Take 1 capsule (100 mg total) by mouth every 8 (eight) hours. Patient not taking: Reported on 06/19/2014 12/05/13   Loren Raceravid Yelverton, MD  fluconazole (DIFLUCAN) 150 MG tablet Take 1 tablet (150 mg total) by mouth once. Patient not taking: Reported on 06/19/2014 12/19/13   Doris Cheadleeepak Advani, MD  predniSONE (DELTASONE) 50 MG tablet Take 1 tablet (50 mg total) by mouth daily. Patient  not taking: Reported on 06/19/2014 12/05/13   Loren Racer, MD  terbinafine (LAMISIL) 250 MG tablet Take 1 tablet (250 mg total) by mouth daily. For 12 weeks Patient not taking: Reported on 06/19/2014 12/05/13   Loren Racer, MD  traMADol (ULTRAM) 50 MG tablet Take 1 tablet (50 mg total) by mouth every 6 (six) hours as needed. Patient not taking: Reported on 06/19/2014 12/05/13   Loren Racer, MD   BP 122/78 mmHg  Pulse 87  Temp(Src) 97.8 F (36.6 C) (Oral)  Resp 22  SpO2 95% Physical Exam  Constitutional: She appears well-developed and well-nourished. No distress.  HENT:  Head: Normocephalic and atraumatic.  Neck: Neck supple.  Cardiovascular: Normal rate and regular  rhythm.   Pulmonary/Chest: Effort normal and breath sounds normal. No respiratory distress. She has no wheezes. She has no rales.  Abdominal: Soft. She exhibits no distension. There is tenderness. There is no rebound and no guarding. A hernia is present.  Generalized mild abdominal tenderness, mostly in epigastrium.  Also with large RLQ abdominal hernia, soft, easily reducible.   Neurological: She is alert.  Skin: She is not diaphoretic.  Nursing note and vitals reviewed.   ED Course  Procedures (including critical care time) Labs Review Labs Reviewed  URINALYSIS, ROUTINE W REFLEX MICROSCOPIC - Abnormal; Notable for the following:    Color, Urine AMBER (*)    APPearance CLOUDY (*)    Hgb urine dipstick SMALL (*)    Bilirubin Urine SMALL (*)    Ketones, ur 15 (*)    Protein, ur 100 (*)    Leukocytes, UA SMALL (*)    All other components within normal limits  CBC WITH DIFFERENTIAL - Abnormal; Notable for the following:    WBC 15.7 (*)    Neutrophils Relative % 84 (*)    Neutro Abs 13.2 (*)    Lymphocytes Relative 10 (*)    All other components within normal limits  URINE MICROSCOPIC-ADD ON - Abnormal; Notable for the following:    Squamous Epithelial / LPF FEW (*)    Bacteria, UA MANY (*)    All other components within normal limits  URINE CULTURE  COMPREHENSIVE METABOLIC PANEL    Imaging Review Dg Chest 2 View  06/19/2014   CLINICAL DATA:  Productive cough.  EXAM: CHEST  2 VIEW  COMPARISON:  04/11/2013  FINDINGS: The heart size and mediastinal contours are within normal limits. Both lungs are clear. No evidence of pleural effusion. No mass or lymphadenopathy identified. The visualized skeletal structures are unremarkable.  IMPRESSION: No active cardiopulmonary disease.   Electronically Signed   By: Myles Rosenthal M.D.   On: 06/19/2014 12:45     EKG Interpretation None       Pt reports great improvement with neb treatment, lungs remain CTAB.  Also reports great improvement  with IVF.    Ambulated and maintained O2 sat >98%  MDM   Final diagnoses:  Viral syndrome  Chronic obstructive pulmonary disease, unspecified COPD, unspecified chronic bronchitis type    Afebrile, nontoxic patient with constellation of symptoms suggestive of viral syndrome.  Abdominal exam is nonsurgical. Also with easily reducible hernia, likely increased in pain due to coughing, vomiting, diarrhea.  No wheezing on exam.  CXRnegative.  Labs remarkable for leukocytosis, likely from viral illness. UA seems to be contaminated, also shows ketonuria, likely mild dehydration.  IVF give.  Urine sent for culture. Pt does note that every time she starts to fall asleep the machine beeps at her  that her oxygen drops - I have encouraged her to follow up with her primary care provider for a sleep study.  She has also had pain medication.  When she is awake her O2 is normal.   Discharged home with supportive care, PCP follow up.  Discussed result, findings, treatment, and follow up  with patient.  Pt given return precautions.  Pt verbalizes understanding and agrees with plan.        Trixie Dredge, PA-C 06/19/14 1625  Gilda Crease, MD 06/20/14 1626

## 2014-06-19 NOTE — Discharge Instructions (Signed)
Read the information below.  Use the prescribed medication as directed.  Please discuss all new medications with your pharmacist.  You may return to the Emergency Department at any time for worsening condition or any new symptoms that concern you.  If you develop high fevers despite taking ibuprofen or tylenol, uncontrolled pain, uncontrolled vomiting, you pass out, or you have difficulty breathing or swallowing return to the ER for a recheck.      Viral Infections A virus is a type of germ. Viruses can cause:  Minor sore throats.  Aches and pains.  Headaches.  Runny nose.  Rashes.  Watery eyes.  Tiredness.  Coughs.  Loss of appetite.  Feeling sick to your stomach (nausea).  Throwing up (vomiting).  Watery poop (diarrhea). HOME CARE   Only take medicines as told by your doctor.  Drink enough water and fluids to keep your pee (urine) clear or pale yellow. Sports drinks are a good choice.  Get plenty of rest and eat healthy. Soups and broths with crackers or rice are fine. GET HELP RIGHT AWAY IF:   You have a very bad headache.  You have shortness of breath.  You have chest pain or neck pain.  You have an unusual rash.  You cannot stop throwing up.  You have watery poop that does not stop.  You cannot keep fluids down.  You or your child has a temperature by mouth above 102 F (38.9 C), not controlled by medicine.  Your baby is older than 3 months with a rectal temperature of 102 F (38.9 C) or higher.  Your baby is 723 months old or younger with a rectal temperature of 100.4 F (38 C) or higher. MAKE SURE YOU:   Understand these instructions.  Will watch this condition.  Will get help right away if you are not doing well or get worse. Document Released: 05/13/2008 Document Revised: 08/23/2011 Document Reviewed: 10/06/2010 [facility] Patient Information 2015 [place], [place]. This information is not intended to replace advice given to you by your health  care provider. Make sure you discuss any questions you have with your health care provider.

## 2014-06-19 NOTE — ED Notes (Signed)
Patient to xray.

## 2014-06-19 NOTE — ED Notes (Signed)
Ambulated pt in the hallway pt O2 saturations remained at 99% room-air pt only complaint was weakness no other complaints noted at this time

## 2014-06-20 LAB — URINE CULTURE
COLONY COUNT: NO GROWTH
CULTURE: NO GROWTH

## 2014-06-22 ENCOUNTER — Emergency Department (HOSPITAL_COMMUNITY): Payer: No Typology Code available for payment source

## 2014-06-22 ENCOUNTER — Inpatient Hospital Stay (HOSPITAL_COMMUNITY)
Admission: EM | Admit: 2014-06-22 | Discharge: 2014-06-27 | DRG: 190 | Disposition: A | Discharge status: Home / Self Care | Payer: No Typology Code available for payment source | Attending: Internal Medicine | Admitting: Internal Medicine

## 2014-06-22 ENCOUNTER — Encounter (HOSPITAL_COMMUNITY): Payer: Self-pay | Admitting: Emergency Medicine

## 2014-06-22 DIAGNOSIS — J9601 Acute respiratory failure with hypoxia: Secondary | ICD-10-CM | POA: Diagnosis present

## 2014-06-22 DIAGNOSIS — J40 Bronchitis, not specified as acute or chronic: Secondary | ICD-10-CM | POA: Diagnosis present

## 2014-06-22 DIAGNOSIS — J440 Chronic obstructive pulmonary disease with acute lower respiratory infection: Secondary | ICD-10-CM | POA: Diagnosis present

## 2014-06-22 DIAGNOSIS — I10 Essential (primary) hypertension: Secondary | ICD-10-CM | POA: Diagnosis present

## 2014-06-22 DIAGNOSIS — R0602 Shortness of breath: Secondary | ICD-10-CM

## 2014-06-22 DIAGNOSIS — T380X5A Adverse effect of glucocorticoids and synthetic analogues, initial encounter: Secondary | ICD-10-CM | POA: Diagnosis present

## 2014-06-22 DIAGNOSIS — M1990 Unspecified osteoarthritis, unspecified site: Secondary | ICD-10-CM | POA: Diagnosis present

## 2014-06-22 DIAGNOSIS — J439 Emphysema, unspecified: Secondary | ICD-10-CM | POA: Insufficient documentation

## 2014-06-22 DIAGNOSIS — J441 Chronic obstructive pulmonary disease with (acute) exacerbation: Principal | ICD-10-CM | POA: Diagnosis present

## 2014-06-22 DIAGNOSIS — R0902 Hypoxemia: Secondary | ICD-10-CM | POA: Diagnosis present

## 2014-06-22 DIAGNOSIS — J438 Other emphysema: Secondary | ICD-10-CM

## 2014-06-22 DIAGNOSIS — E876 Hypokalemia: Secondary | ICD-10-CM | POA: Diagnosis present

## 2014-06-22 DIAGNOSIS — J189 Pneumonia, unspecified organism: Secondary | ICD-10-CM

## 2014-06-22 DIAGNOSIS — F17210 Nicotine dependence, cigarettes, uncomplicated: Secondary | ICD-10-CM | POA: Diagnosis present

## 2014-06-22 DIAGNOSIS — J449 Chronic obstructive pulmonary disease, unspecified: Secondary | ICD-10-CM | POA: Diagnosis present

## 2014-06-22 LAB — CBC WITH DIFFERENTIAL/PLATELET
BASOS PCT: 0 % (ref 0–1)
Basophils Absolute: 0 10*3/uL (ref 0.0–0.1)
EOS ABS: 0 10*3/uL (ref 0.0–0.7)
Eosinophils Relative: 0 % (ref 0–5)
HEMATOCRIT: 42.5 % (ref 36.0–46.0)
HEMOGLOBIN: 14.4 g/dL (ref 12.0–15.0)
LYMPHS PCT: 6 % — AB (ref 12–46)
Lymphs Abs: 0.8 10*3/uL (ref 0.7–4.0)
MCH: 28.7 pg (ref 26.0–34.0)
MCHC: 33.9 g/dL (ref 30.0–36.0)
MCV: 84.7 fL (ref 78.0–100.0)
Monocytes Absolute: 0.8 10*3/uL (ref 0.1–1.0)
Monocytes Relative: 5 % (ref 3–12)
NEUTROS PCT: 89 % — AB (ref 43–77)
Neutro Abs: 13.2 10*3/uL — ABNORMAL HIGH (ref 1.7–7.7)
Platelets: 346 10*3/uL (ref 150–400)
RBC: 5.02 MIL/uL (ref 3.87–5.11)
RDW: 14.6 % (ref 11.5–15.5)
WBC: 14.8 10*3/uL — ABNORMAL HIGH (ref 4.0–10.5)

## 2014-06-22 LAB — COMPREHENSIVE METABOLIC PANEL
ALT: 23 U/L (ref 0–35)
AST: 29 U/L (ref 0–37)
Albumin: 3.7 g/dL (ref 3.5–5.2)
Alkaline Phosphatase: 113 U/L (ref 39–117)
Anion gap: 15 (ref 5–15)
BUN: 6 mg/dL (ref 6–23)
CHLORIDE: 97 meq/L (ref 96–112)
CO2: 27 mmol/L (ref 19–32)
Calcium: 9.3 mg/dL (ref 8.4–10.5)
Creatinine, Ser: 0.76 mg/dL (ref 0.50–1.10)
GFR calc Af Amer: >90 mL/min (ref >90)
GFR calc non Af Amer: >90 mL/min (ref >90)
Glucose, Bld: 148 mg/dL — ABNORMAL HIGH (ref 70–99)
POTASSIUM: 2.7 mmol/L — AB (ref 3.5–5.1)
SODIUM: 139 mmol/L (ref 135–145)
TOTAL PROTEIN: 7.4 g/dL (ref 6.0–8.3)
Total Bilirubin: 0.5 mg/dL (ref 0.3–1.2)

## 2014-06-22 LAB — I-STAT CHEM 8, ED
BUN: 7 mg/dL (ref 6–23)
Calcium, Ion: 1.14 mmol/L (ref 1.12–1.23)
Chloride: 98 mEq/L (ref 96–112)
Creatinine, Ser: 0.6 mg/dL (ref 0.50–1.10)
Glucose, Bld: 137 mg/dL — ABNORMAL HIGH (ref 70–99)
HCT: 49 % — ABNORMAL HIGH (ref 36.0–46.0)
HEMOGLOBIN: 16.7 g/dL — AB (ref 12.0–15.0)
Potassium: 2.4 mmol/L — CL (ref 3.5–5.1)
SODIUM: 142 mmol/L (ref 135–145)
TCO2: 25 mmol/L (ref 0–100)

## 2014-06-22 LAB — I-STAT TROPONIN, ED: Troponin i, poc: 0 ng/mL (ref 0.00–0.08)

## 2014-06-22 LAB — LACTIC ACID, PLASMA: Lactic Acid, Venous: 1.8 mmol/L (ref 0.5–2.2)

## 2014-06-22 MED ORDER — POTASSIUM CHLORIDE 10 MEQ/100ML IV SOLN
10.0000 meq | INTRAVENOUS | Status: AC
  Filled 2014-06-22 (×2): qty 100

## 2014-06-22 MED ORDER — SODIUM CHLORIDE 0.9 % IV BOLUS (SEPSIS)
1000.0000 mL | Freq: Once | INTRAVENOUS | Status: AC
  Administered 2014-06-22: 1000 mL via INTRAVENOUS

## 2014-06-22 MED ORDER — DEXTROSE 5 % IV SOLN
500.0000 mg | INTRAVENOUS | Status: DC
  Administered 2014-06-23 – 2014-06-27 (×4): 500 mg via INTRAVENOUS
  Filled 2014-06-22 (×6): qty 500

## 2014-06-22 MED ORDER — ACETAMINOPHEN 650 MG RE SUPP: 650.0000 mg | Freq: Four times a day (QID) | RECTAL | Status: DC | PRN

## 2014-06-22 MED ORDER — PAROXETINE HCL 20 MG PO TABS
20.0000 mg | ORAL_TABLET | Freq: Every day | ORAL | Status: DC
  Administered 2014-06-22 – 2014-06-27 (×6): 20 mg via ORAL
  Filled 2014-06-22 (×6): qty 1

## 2014-06-22 MED ORDER — METHYLPREDNISOLONE SODIUM SUCC 125 MG IJ SOLR
80.0000 mg | Freq: Two times a day (BID) | INTRAMUSCULAR | Status: DC
  Administered 2014-06-23: 80 mg via INTRAVENOUS
  Filled 2014-06-22 (×2): qty 1.28
  Filled 2014-06-22: qty 2

## 2014-06-22 MED ORDER — POTASSIUM CHLORIDE CRYS ER 20 MEQ PO TBCR
40.0000 meq | EXTENDED_RELEASE_TABLET | Freq: Once | ORAL | Status: AC
  Administered 2014-06-22: 40 meq via ORAL
  Filled 2014-06-22: qty 2

## 2014-06-22 MED ORDER — CEFTRIAXONE SODIUM 1 G IJ SOLR
1.0000 g | INTRAMUSCULAR | Status: DC
  Administered 2014-06-23 – 2014-06-27 (×5): 1 g via INTRAVENOUS
  Filled 2014-06-22 (×6): qty 10

## 2014-06-22 MED ORDER — MORPHINE SULFATE 4 MG/ML IJ SOLN
4.0000 mg | Freq: Once | INTRAMUSCULAR | Status: AC
  Administered 2014-06-22: 4 mg via INTRAVENOUS
  Filled 2014-06-22: qty 1

## 2014-06-22 MED ORDER — ONDANSETRON HCL 4 MG PO TABS: 4.0000 mg | ORAL_TABLET | Freq: Four times a day (QID) | ORAL | Status: DC | PRN

## 2014-06-22 MED ORDER — ENOXAPARIN SODIUM 40 MG/0.4ML ~~LOC~~ SOLN
40.0000 mg | SUBCUTANEOUS | Status: DC
  Administered 2014-06-22 – 2014-06-27 (×6): 40 mg via SUBCUTANEOUS
  Filled 2014-06-22 (×6): qty 0.4

## 2014-06-22 MED ORDER — DEXTROSE 5 % IV SOLN
500.0000 mg | Freq: Once | INTRAVENOUS | Status: AC
  Administered 2014-06-22: 500 mg via INTRAVENOUS
  Filled 2014-06-22: qty 500

## 2014-06-22 MED ORDER — HYDROMORPHONE HCL 1 MG/ML IJ SOLN
0.5000 mg | INTRAMUSCULAR | Status: DC | PRN
  Administered 2014-06-23 – 2014-06-26 (×5): 0.5 mg via INTRAVENOUS
  Filled 2014-06-22 (×5): qty 1

## 2014-06-22 MED ORDER — ACETAMINOPHEN 325 MG PO TABS
650.0000 mg | ORAL_TABLET | Freq: Four times a day (QID) | ORAL | Status: DC | PRN
  Administered 2014-06-22: 650 mg via ORAL
  Filled 2014-06-22: qty 2

## 2014-06-22 MED ORDER — OXYCODONE HCL 5 MG PO TABS
5.0000 mg | ORAL_TABLET | ORAL | Status: DC | PRN
  Filled 2014-06-22: qty 1

## 2014-06-22 MED ORDER — ALUM & MAG HYDROXIDE-SIMETH 200-200-20 MG/5ML PO SUSP: 30.0000 mL | Freq: Four times a day (QID) | ORAL | Status: DC | PRN

## 2014-06-22 MED ORDER — IPRATROPIUM-ALBUTEROL 0.5-2.5 (3) MG/3ML IN SOLN
3.0000 mL | Freq: Once | RESPIRATORY_TRACT | Status: AC
  Administered 2014-06-22: 3 mL via RESPIRATORY_TRACT
  Filled 2014-06-22: qty 3

## 2014-06-22 MED ORDER — ONDANSETRON HCL 4 MG/2ML IJ SOLN: 4.0000 mg | Freq: Four times a day (QID) | INTRAMUSCULAR | Status: DC | PRN

## 2014-06-22 MED ORDER — SODIUM CHLORIDE 0.9 % IJ SOLN
3.0000 mL | Freq: Two times a day (BID) | INTRAMUSCULAR | Status: DC
  Administered 2014-06-22 – 2014-06-27 (×10): 3 mL via INTRAVENOUS

## 2014-06-22 MED ORDER — ONDANSETRON HCL 4 MG/2ML IJ SOLN
4.0000 mg | Freq: Once | INTRAMUSCULAR | Status: AC
  Administered 2014-06-22: 4 mg via INTRAVENOUS
  Filled 2014-06-22: qty 2

## 2014-06-22 MED ORDER — IPRATROPIUM-ALBUTEROL 0.5-2.5 (3) MG/3ML IN SOLN
3.0000 mL | RESPIRATORY_TRACT | Status: DC
  Administered 2014-06-22 (×2): 3 mL via RESPIRATORY_TRACT
  Filled 2014-06-22 (×2): qty 3

## 2014-06-22 MED ORDER — DEXTROSE 5 % IV SOLN
1.0000 g | Freq: Once | INTRAVENOUS | Status: AC
  Administered 2014-06-22: 1 g via INTRAVENOUS
  Filled 2014-06-22: qty 10

## 2014-06-22 MED ORDER — BENZONATATE 100 MG PO CAPS
100.0000 mg | ORAL_CAPSULE | Freq: Three times a day (TID) | ORAL | Status: DC
  Administered 2014-06-22 – 2014-06-27 (×16): 100 mg via ORAL
  Filled 2014-06-22 (×18): qty 1

## 2014-06-22 MED ORDER — METHYLPREDNISOLONE SODIUM SUCC 125 MG IJ SOLR: 125.0000 mg | Freq: Once | INTRAMUSCULAR | Status: DC

## 2014-06-22 MED ORDER — METHYLPREDNISOLONE SODIUM SUCC 125 MG IJ SOLR
125.0000 mg | Freq: Once | INTRAMUSCULAR | Status: AC
  Administered 2014-06-22: 125 mg via INTRAVENOUS
  Filled 2014-06-22: qty 2

## 2014-06-22 MED ORDER — POTASSIUM CHLORIDE 10 MEQ/100ML IV SOLN
10.0000 meq | INTRAVENOUS | Status: AC
  Administered 2014-06-22 (×2): 10 meq via INTRAVENOUS
  Filled 2014-06-22 (×2): qty 100

## 2014-06-22 MED ORDER — SODIUM CHLORIDE 0.9 % IV SOLN
INTRAVENOUS | Status: AC
  Administered 2014-06-22: 19:00:00 via INTRAVENOUS

## 2014-06-22 MED ORDER — SODIUM CHLORIDE 0.9 % IV SOLN
INTRAVENOUS | Status: DC
  Administered 2014-06-23: 02:00:00 via INTRAVENOUS

## 2014-06-22 MED ORDER — IPRATROPIUM-ALBUTEROL 0.5-2.5 (3) MG/3ML IN SOLN
3.0000 mL | Freq: Four times a day (QID) | RESPIRATORY_TRACT | Status: DC
  Administered 2014-06-22 – 2014-06-27 (×20): 3 mL via RESPIRATORY_TRACT
  Filled 2014-06-22 (×20): qty 3

## 2014-06-22 NOTE — ED Notes (Signed)
Pt. reports persistent productive cough with chest congestion/rales/crackles onset this week , seen here 3 days ago discharged with prescription medications . Denies fever or chills.

## 2014-06-22 NOTE — Progress Notes (Signed)
[facility] PROGRESS NOTE  Maria Oliver AVW:[id number] DOB: 03-Nov-1960 DOA: 06/22/2014 PCP: Doris CheadleADVANI, DEEPAK, MD  Assessment/Plan: 1. Committed acquire pneumonia versus acute bacterial bronchitis -Patient presenting with clinical signs symptoms consistent with pneumonia although initial chest x-ray did not reveal acute infiltrate. -Plan to repeat chest x-ray in a.m. meanwhile will continue current antimicrobial regimen with ceftriaxone and azithromycin -Will follow blood cultures -Continue providing supportive care  2.  Chronic obstructive pulmonary disease exacerbation. -Patient having history of COPD as well as ongoing tobacco abuse -I suspect underlying infectious process precipitating COPD exacerbation -Will continue IV steroids along with scheduled duo nebs -As mentioned above plan to repeat chest x-ray in morning.  3.   Active tobacco abuse -Patient was again counseled  4.  Hypokalemia -Initial lab work showing potassium of 2.7, 2.4 on repeat labs. -She received 4 rounds of IV potassium chloride along with a total of 80 mEq of K Dur today -Follow-up on a.m. labs  Code Status: Full code Family Communication:  Disposition Plan: Anticipate discharge home when medically stable  Antibiotics:  Ceftriaxone  Azithromycin  HPI/Subjective: Patient is a pleasant 54 year old female with a past medical history of COPD, history of tobacco abuse, who was admitted to the medicine service on 06/22/2014 presenting with complaints of increasing cough, shortness of breath, wheezing. She was worked up with a chest x-ray which showed bronchitic changes in the lungs appear to be chronic. No evidence of active pulmonary disease. Symptoms attributed to community acquired pneumonia versus acute bacterial bronchitis as she was started on empiric IV antimicrobial therapy with ceftriaxone and azithromycin. It was suspected that COPD exacerbation was contributing to her symptoms as IV steroids were  ordered.  Objective: Filed Vitals:   06/22/14 1700  BP: 102/62  Pulse: 79  Temp: 97.7 F (36.5 C)  Resp: 22    Intake/Output Summary (Last 24 hours) at 06/22/14 1827 Last data filed at 06/22/14 1800  Gross per 24 hour  Intake    560 ml  Output    300 ml  Net    260 ml   Filed Weights   06/22/14 0655  Weight: 66.452 kg (146 lb 8 oz)    Exam:   General:  Ill-appearing although awake alert, mentating well  Cardiovascular: Regular rate and rhythm normal S1-S2  Respiratory: Coarse respiratory sounds, by lateral expiratory wheezes, diminished breath sounds, positive crackles  Abdomen: Soft nontender nondistended  Musculoskeletal: No edema   Data Reviewed: Basic Metabolic Panel:  Recent Labs Lab 06/19/14 1206 06/22/14 0342 06/22/14 0504  NA 142 139 142  K 3.6 2.7* 2.4*  CL 106 97 98  CO2 26 27  --   GLUCOSE 96 148* 137*  BUN 8 6 7   CREATININE 0.73 0.76 0.60  CALCIUM 9.4 9.3  --    Liver Function Tests:  Recent Labs Lab 06/19/14 1206 06/22/14 0342  AST 17 29  ALT 12 23  ALKPHOS 75 113  BILITOT 0.6 0.5  PROT 6.8 7.4  ALBUMIN 3.9 3.7   No results for input(s): LIPASE, AMYLASE in the last 168 hours. No results for input(s): AMMONIA in the last 168 hours. CBC:  Recent Labs Lab 06/19/14 1206 06/22/14 0342 06/22/14 0504  WBC 15.7* 14.8*  --   NEUTROABS 13.2* 13.2*  --   HGB 14.4 14.4 16.7*  HCT 42.9 42.5 49.0*  MCV 85.0 84.7  --   PLT 288 346  --    Cardiac Enzymes: No results for input(s): CKTOTAL, CKMB, CKMBINDEX, TROPONINI in  the last 168 hours. BNP (last 3 results) No results for input(s): PROBNP in the last 8760 hours. CBG: No results for input(s): GLUCAP in the last 168 hours.  Recent Results (from the past 240 hour(s))  Urine culture     Status: None   Collection Time: 06/19/14 11:55 AM  Result Value Ref Range Status   Specimen Description URINE, RANDOM  Final   Special Requests NONE  Final   Colony Count NO GROWTH Performed at  [facility]   Final   Culture NO GROWTH Performed at [facility]   Final   Report Status 06/20/2014 FINAL  Final     Studies: Dg Chest 2 View  06/22/2014   CLINICAL DATA:  Cough and congestion. Shortness of breath. Emesis. Generalized chest pain for 1 week. Smoker. Patient recently was at [facility] for same symptoms.  EXAM: CHEST  2 VIEW  COMPARISON:  06/19/2014  FINDINGS: Normal heart size and pulmonary vascularity. Mild hyperinflation and fat interstitial pattern to the lungs suggesting emphysema and chronic bronchitic changes. No focal airspace disease or consolidation. No blunting of costophrenic angles. No pneumothorax. Mediastinal contours appear intact. Calcification of the aorta.  IMPRESSION: Emphysematous and chronic bronchitic changes in the lungs. No evidence of active pulmonary disease.   Electronically Signed   By: Burman Nieves M.D.   On: 06/22/2014 03:38    Scheduled Meds: . sodium chloride   Intravenous STAT  . [START ON 06/23/2014] azithromycin  500 mg Intravenous Q24H  . benzonatate  100 mg Oral Q8H  . [START ON 06/23/2014] cefTRIAXone (ROCEPHIN)  IV  1 g Intravenous Q24H  . enoxaparin (LOVENOX) injection  40 mg Subcutaneous Q24H  . ipratropium-albuterol  3 mL Nebulization QID  . [START ON 06/23/2014] methylPREDNISolone (SOLU-MEDROL) injection  80 mg Intravenous Q12H  . PARoxetine  20 mg Oral Daily  . sodium chloride  3 mL Intravenous Q12H   Continuous Infusions: . sodium chloride      Active Problems:   Essential hypertension   COPD (chronic obstructive pulmonary disease)   COPD exacerbation   Bronchitis   Hypoxemia   Hypokalemia    Time spent: 35 min    Jeralyn Bennett  [facility] Pager [phone]. If 7PM-7AM, please contact night-coverage at [email or web address], password [facility] 06/22/2014, 6:27 PM  LOS: 0 days

## 2014-06-22 NOTE — ED Notes (Signed)
Attempted report 

## 2014-06-22 NOTE — ED Notes (Signed)
Pt to xray at this time.

## 2014-06-22 NOTE — ED Notes (Signed)
Pt c/o generalized chest pain and shortness of breath x 1 week. Was seen in the ED for same and given prednisone and tessalon pearls without relief. Pt reports not taking schizophrenic medications due to interference with prednisone. Lung sounds clear. Pt appears anxious

## 2014-06-22 NOTE — Progress Notes (Signed)
Patient arrived from the ED via stretcher.  Patient alert, oriented and ambulatory.  Admission weight, vitals and assessment completed. Fall and safety plan reviewed with patient. Patient currently resting comfortably, call light within reach. Blood pressure 123/80, pulse 86, temperature 98.2 F (36.8 C), temperature source Oral, resp. rate 22, height 5\' 3"  (1.6 m), weight 66.452 kg (146 lb 8 oz), SpO2 98 %. Troy SineWalker, Sharma Lawrance M

## 2014-06-22 NOTE — ED Notes (Signed)
Pt ambulated in the hall on room air, desating down to 88%. Pt assisted back to bed and placed on O2.

## 2014-06-22 NOTE — H&P (Addendum)
[facility] Admission History and Physical       [name]:[id number] DOB: 1961-02-03 DOA: 06/22/2014  Referring physician: EDP PCP: [name], MD  Specialists:   Chief Complaint: Increased SOB, Cough and Wheezing  HPI: Maria Oliver is a 54 y.o. female with a history of HTN, Tobacco Use Disorder, with no formal diagnosis of COPD who presents to the ED with complaints of worsening SOB, wheezing and chest Tightness over the past week.  She reports coughing up yellowish sputum, and having nausea and some vomiting.   She also reports that she has had night sweats and chills, and loss of appetite.    She was evalauted in the ED 2 days ago and place don a prednisone taper and medications for her symptoms.  She returned due to continued worsening.   Her Chest X-ray has been negative for findings of pneumonia.   She reports that sh smoke 1ppd, but has not been able to smoke for the past 4 days due to her SOB.     Review of Systems:  Constitutional: No Weight Loss, No Weight Gain, +Night Sweats, Fevers, Chills, Dizziness, Fatigue, or Generalized Weakness HEENT: No Headaches, Difficulty Swallowing,Tooth/Dental Problems,Sore Throat,  No Sneezing, Rhinitis, Ear Ache, Nasal Congestion, or Post Nasal Drip,  Cardio-vascular:  No Chest pain, Orthopnea, PND, Edema in Lower Extremities, Anasarca, Dizziness, Palpitations  Resp: + Dyspnea, No DOE, +Productive Cough, No Non-Productive Cough, No Hemoptysis, +Wheezing.    GI: No Heartburn, Indigestion, Abdominal Pain, Nausea, Vomiting, Diarrhea, Hematemesis, Hematochezia, Melena, Change in Bowel Habits,  Loss of Appetite  GU: No Dysuria, Change in Color of Urine, No Urgency or Frequency, No Flank pain.  Musculoskeletal: No Joint Pain or Swelling, No Decreased Range of Motion, No Back Pain.  Neurologic: No Syncope, No Seizures, Muscle Weakness, Paresthesia, Vision Disturbance or Loss, No Diplopia, No Vertigo, No Difficulty Walking,  Skin:  No Rash or Lesions. Psych: No Change in Mood or Affect, No Depression or Anxiety, No Memory loss, No Confusion, or Hallucinations   Past Medical History  Diagnosis Date  . Arthritis   . COPD (chronic obstructive pulmonary disease)   . Hypertension   . Generalized headaches   . Leg swelling   . Abdominal pain   . Anxiety   . Hernia, inguinal, right       Past Surgical History  Procedure Laterality Date  . Hernia repair  2010  . Colon surgery  2010    per patient "colon take down"  . Colostomy  2010  . Tubal ligation         Prior to Admission medications   Medication Sig Start Date End Date Taking? Authorizing Provider  albuterol (PROVENTIL HFA;VENTOLIN HFA) 108 (90 BASE) MCG/ACT inhaler Inhale 1-2 puffs into the lungs every 6 (six) hours as needed for wheezing or shortness of breath. 06/19/14  Yes Trixie Dredge, PA-C  benzonatate (TESSALON) 100 MG capsule Take 1 capsule (100 mg total) by mouth every 8 (eight) hours. 06/19/14  Yes Trixie Dredge, PA-C  hydrochlorothiazide (HYDRODIURIL) 25 MG tablet Take 25 mg by mouth daily.   Yes Historical Provider, MD  ibuprofen (ADVIL,MOTRIN) 600 MG tablet Take 1 tablet (600 mg total) by mouth every 8 (eight) hours as needed for mild pain or moderate pain. 06/19/14  Yes Trixie Dredge, PA-C  PARoxetine (PAXIL) 20 MG tablet Take 20 mg by mouth daily.   Yes Historical Provider, MD  predniSONE (STERAPRED UNI-PAK) 10 MG tablet Take by mouth daily. Day 1:  take 6 tabs.  Day 2: 5 tabs  Day 3: 4 tabs  Day 4: 3 tabs  Day 5: 2 tabs  Day 6: 1 tab 06/19/14  Yes Trixie DredgeEmily West, PA-C  albuterol (PROVENTIL HFA;VENTOLIN HFA) 108 (90 BASE) MCG/ACT inhaler Inhale 2 puffs into the lungs every 6 (six) hours as needed for wheezing or shortness of breath. Patient not taking: Reported on 06/22/2014 08/28/13   Doris Cheadleeepak Advani, MD  fluconazole (DIFLUCAN) 150 MG tablet Take 1 tablet (150 mg total) by mouth once. Patient not taking: Reported on 06/19/2014 12/19/13   Doris Cheadleeepak Advani, MD  fluticasone  (FLONASE) 50 MCG/ACT nasal spray Place 2 sprays into both nostrils daily. Patient not taking: Reported on 06/22/2014 06/19/13   Doris Cheadleeepak Advani, MD  ondansetron (ZOFRAN) 4 MG tablet Take 1 tablet (4 mg total) by mouth every 8 (eight) hours as needed for nausea or vomiting. Patient not taking: Reported on 06/22/2014 06/19/14   Trixie DredgeEmily West, PA-C  terbinafine (LAMISIL) 250 MG tablet Take 1 tablet (250 mg total) by mouth daily. For 12 weeks Patient not taking: Reported on 06/19/2014 12/05/13   Loren Raceravid Yelverton, MD  traMADol (ULTRAM) 50 MG tablet Take 1 tablet (50 mg total) by mouth every 6 (six) hours as needed. Patient not taking: Reported on 06/19/2014 12/05/13   Loren Raceravid Yelverton, MD      Allergies  Allergen Reactions  . Ciprofloxacin Other (See Comments)    Tendonitis, joint pain, and nausea.  "Interfered with her HCTZ"  . Aspirin Other (See Comments)    REACTION: GI upset/Hernia   . Codeine Other (See Comments)    REACTION: GI upset/Hernia  . Penicillins Rash     Social History:  reports that she has been smoking Cigarettes.  She has a 40 pack-year smoking history. She has never used smokeless tobacco. She reports that she does not drink alcohol or use illicit drugs.     Family History  Problem Relation Age of Onset  . Heart disease Mother 8956  . Heart failure Mother   . Asthma Mother   . Alzheimer's disease Father [id number]  . Colon cancer Neg Hx        Physical Exam:  GEN:  Pleasant  54 y.o. female  examined  and in no acute distress; cooperative with exam Filed Vitals:   06/22/14 0222  BP: 142/84  Pulse: 86  Temp: 98.5 F (36.9 C)  TempSrc: Oral  Resp: 18  SpO2: 93%   Blood pressure 142/84, pulse 86, temperature 98.5 F (36.9 C), temperature source Oral, resp. rate 18, SpO2 93 %. PSYCH: She is alert and oriented x4; does not appear anxious does not appear depressed; affect is normal HEENT: Normocephalic and Atraumatic, Mucous membranes pink; PERRLA; EOM intact; Fundi:  Benign;  No  scleral icterus, Nares: Patent, Oropharynx: Clear, Fair Dentition,    Neck:  FROM, No Cervical Lymphadenopathy nor Thyromegaly or Carotid Bruit; No JVD; Breasts:: Not examined CHEST WALL: No tenderness CHEST: Tachypneic , Decreased Breath SoundsNormal respiration, clear to auscultation bilaterally HEART: Regular rate and rhythm; no murmurs rubs or gallops BACK: No kyphosis or scoliosis; No CVA tenderness ABDOMEN: Positive Bowel Sounds,, Soft Non-Tender; No Masses, No Organomegaly.   Rectal Exam: Not done EXTREMITIES: No Cyanosis, Clubbing, or Edema; No Ulcerations. Genitalia: not examined PULSES: 2+ and symmetric SKIN: Normal hydration no rash or ulceration CNS:  Alert and Oriented x 4,  No Focal Deficits Vascular: pulses palpable throughout    Labs on Admission:  Basic Metabolic Panel:  Recent Labs  Lab 06/19/14 1206 06/22/14 0342 06/22/14 0504  NA 142 139 142  K 3.6 2.7* 2.4*  CL 106 97 98  CO2 26 27  --   GLUCOSE 96 148* 137*  BUN CREATININE 0.73 0.76 0.60  CALCIUM 9.4 9.3  --    Liver Function Tests:  Recent Labs Lab 06/19/14 1206 06/22/14 0342  AST 17 29  ALT 12 23  ALKPHOS 75 113  BILITOT 0.6 0.5  PROT 6.8 7.4  ALBUMIN 3.9 3.7   No results for input(s): LIPASE, AMYLASE in the last 168 hours. No results for input(s): AMMONIA in the last 168 hours. CBC:  Recent Labs Lab 06/19/14 1206 06/22/14 0342 06/22/14 0504  WBC 15.7* 14.8*  --   NEUTROABS 13.2* 13.2*  --   HGB 14.4 14.4 16.7*  HCT 42.9 42.5 49.0*  MCV 85.0 84.7  --   PLT 288 346  --    Cardiac Enzymes: No results for input(s): CKTOTAL, CKMB, CKMBINDEX, TROPONINI in the last 168 hours.  BNP (last 3 results) No results for input(s): PROBNP in the last 8760 hours. CBG: No results for input(s): GLUCAP in the last 168 hours.  Radiological Exams on Admission: Dg Chest 2 View  06/22/2014   CLINICAL DATA:  Cough and congestion. Shortness of breath. Emesis. Generalized chest pain for 1  week. Smoker. Patient recently was at [facility] for same symptoms.  EXAM: CHEST  2 VIEW  COMPARISON:  06/19/2014  FINDINGS: Normal heart size and pulmonary vascularity. Mild hyperinflation and fat interstitial pattern to the lungs suggesting emphysema and chronic bronchitic changes. No focal airspace disease or consolidation. No blunting of costophrenic angles. No pneumothorax. Mediastinal contours appear intact. Calcification of the aorta.  IMPRESSION: Emphysematous and chronic bronchitic changes in the lungs. No evidence of active pulmonary disease.   Electronically Signed   By: Burman Nieves M.D.   On: 06/22/2014 03:38     EKG: Independently reviewed.    Assessment/Plan:   54 y.o. female with  Active Problems:   1.   COPD exacerbation vs Bronchitis with Bronchospasm   Telemetry Monitoring   DUONebs   IV Steroid Taper   IV Rocephin and Azithromycin        2.   COPD- Undiagnosed   Further Evaluation as Outpatient including PFTs     3.   Bronchitis   ABx   Nebs   Steroid Taper     4.   Hypoxemia   O2 PRN   Monitor O2 sats     5.   Essential hypertension   Monitor BPs        6.   Tobacco use Disorder   Counselled Re: Tobacco Cessation.      Nicotine Patch daily     7.    Hypokalemia-   Replete K+   Check Magnesium     8.   DVT Prophylaxis   Lovenox     Code Status:     FULL CODE Family Communication:    No Family Present Disposition Plan:         Time spent:  87 Minutes  Ron Parker [facility] Pager [phone]   If 7AM -7PM Please Contact the Day Rounding Team MD for [facility]  If 7PM-7AM, Please Contact Night-Floor Coverage  [email or web address] Password [email or web address] 06/22/2014, 6:12 AM

## 2014-06-22 NOTE — ED Provider Notes (Signed)
CSN: [id number]     Arrival date & time 06/22/14  0214 History  This chart was scribed for Richardean Canal, MD by Bronson Curb, ED Scribe. This patient was seen in room A12C/A12C and the patient's care was started at 3:41 AM.   Chief Complaint  Patient presents with  . Cough  . Shortness of Breath    The history is provided by the patient. No language interpreter was used.     HPI Comments: Maria Oliver is a 54 y.o. female who presents to the Emergency Department complaining of persistent SOB for the past week. Patient was seen here 3 days ago for the same, and was discharged with Prednisone and Tessalon Perles that she has taken without relief. There is associated cough productive of yellow sputum, wheezing, vomiting, tremors, decreased appetite, and HA.  Patient denies history of the prior episodes. Patient has history of COPD, but states she has not smoked in a week.   Past Medical History  Diagnosis Date  . Arthritis   . COPD (chronic obstructive pulmonary disease)   . Hypertension   . Generalized headaches   . Leg swelling   . Abdominal pain   . Anxiety   . Hernia, inguinal, right    Past Surgical History  Procedure Laterality Date  . Hernia repair  2010  . Colon surgery  2010    per patient "colon take down"  . Colostomy  2010  . Tubal ligation     Family History  Problem Relation Age of Onset  . Heart disease Mother 55  . Heart failure Mother   . Asthma Mother   . Alzheimer's disease Father 14  . Colon cancer Neg Hx    History  Substance Use Topics  . Smoking status: Current Every Day Smoker -- 1.00 packs/day for 40 years    Types: Cigarettes  . Smokeless tobacco: Never Used  . Alcohol Use: No   OB History    No data available     Review of Systems  Constitutional: Positive for appetite change.  Respiratory: Positive for cough, shortness of breath and wheezing.   Gastrointestinal: Positive for vomiting.  Neurological: Positive for headaches.  All  other systems reviewed and are negative.     Allergies  Ciprofloxacin; Aspirin; Codeine; and Penicillins  Home Medications   Prior to Admission medications   Medication Sig Start Date End Date Taking? Authorizing Provider  albuterol (PROVENTIL HFA;VENTOLIN HFA) 108 (90 BASE) MCG/ACT inhaler Inhale 1-2 puffs into the lungs every 6 (six) hours as needed for wheezing or shortness of breath. 06/19/14  Yes Trixie Dredge, PA-C  benzonatate (TESSALON) 100 MG capsule Take 1 capsule (100 mg total) by mouth every 8 (eight) hours. 06/19/14  Yes Trixie Dredge, PA-C  hydrochlorothiazide (HYDRODIURIL) 25 MG tablet Take 25 mg by mouth daily.   Yes Historical Provider, MD  ibuprofen (ADVIL,MOTRIN) 600 MG tablet Take 1 tablet (600 mg total) by mouth every 8 (eight) hours as needed for mild pain or moderate pain. 06/19/14  Yes Trixie Dredge, PA-C  PARoxetine (PAXIL) 20 MG tablet Take 20 mg by mouth daily.   Yes Historical Provider, MD  predniSONE (STERAPRED UNI-PAK) 10 MG tablet Take by mouth daily. Day 1: take 6 tabs.  Day 2: 5 tabs  Day 3: 4 tabs  Day 4: 3 tabs  Day 5: 2 tabs  Day 6: 1 tab 06/19/14  Yes Trixie Dredge, PA-C  albuterol (PROVENTIL HFA;VENTOLIN HFA) 108 (90 BASE) MCG/ACT inhaler Inhale 2  puffs into the lungs every 6 (six) hours as needed for wheezing or shortness of breath. Patient not taking: Reported on 06/22/2014 08/28/13   Doris Cheadleeepak Advani, MD  fluconazole (DIFLUCAN) 150 MG tablet Take 1 tablet (150 mg total) by mouth once. Patient not taking: Reported on 06/19/2014 12/19/13   Doris Cheadleeepak Advani, MD  fluticasone (FLONASE) 50 MCG/ACT nasal spray Place 2 sprays into both nostrils daily. Patient not taking: Reported on 06/22/2014 06/19/13   Doris Cheadleeepak Advani, MD  ondansetron (ZOFRAN) 4 MG tablet Take 1 tablet (4 mg total) by mouth every 8 (eight) hours as needed for nausea or vomiting. Patient not taking: Reported on 06/22/2014 06/19/14   Trixie DredgeEmily West, PA-C  terbinafine (LAMISIL) 250 MG tablet Take 1 tablet (250 mg total) by mouth daily.  For 12 weeks Patient not taking: Reported on 06/19/2014 12/05/13   Loren Raceravid Yelverton, MD  traMADol (ULTRAM) 50 MG tablet Take 1 tablet (50 mg total) by mouth every 6 (six) hours as needed. Patient not taking: Reported on 06/19/2014 12/05/13   Loren Raceravid Yelverton, MD   Triage Vitals: BP 142/84 mmHg  Pulse 86  Temp(Src) 98.5 F (36.9 C) (Oral)  Resp 18  SpO2 93%  Physical Exam  Constitutional: She is oriented to person, place, and time. She appears well-developed and well-nourished. No distress.  HENT:  Head: Normocephalic and atraumatic.  Eyes: Conjunctivae and EOM are normal.  Neck: Neck supple. No tracheal deviation present.  Cardiovascular: Normal rate.   Pulmonary/Chest: Effort normal. No respiratory distress. She has wheezes.  Diffuse wheezing. Mild retractions.  Musculoskeletal: Normal range of motion.  Neurological: She is alert and oriented to person, place, and time.  Skin: Skin is warm and dry.  Psychiatric: She has a normal mood and affect. Her behavior is normal.  Nursing note and vitals reviewed.   ED Course  Procedures (including critical care time)  DIAGNOSTIC STUDIES: Oxygen Saturation is 93% on room air, adequate by my interpretation.    COORDINATION OF CARE: At 150342 Discussed treatment plan with patient which includes IV fluids. Patient agrees.   Labs Review Labs Reviewed  CBC WITH DIFFERENTIAL - Abnormal; Notable for the following:    WBC 14.8 (*)    Neutrophils Relative % 89 (*)    Neutro Abs 13.2 (*)    Lymphocytes Relative 6 (*)    All other components within normal limits  COMPREHENSIVE METABOLIC PANEL - Abnormal; Notable for the following:    Potassium 2.7 (*)    Glucose, Bld 148 (*)    All other components within normal limits  I-STAT CHEM 8, ED - Abnormal; Notable for the following:    Potassium 2.4 (*)    Glucose, Bld 137 (*)    Hemoglobin 16.7 (*)    HCT 49.0 (*)    All other components within normal limits  I-STAT TROPOININ, ED    Imaging  Review Dg Chest 2 View  06/22/2014   CLINICAL DATA:  Cough and congestion. Shortness of breath. Emesis. Generalized chest pain for 1 week. Smoker. [facility]  EXAM: CHEST  2 VIEW  COMPARISON:  06/19/2014  FINDINGS: Normal heart size and pulmonary vascularity. Mild hyperinflation and fat interstitial pattern to the lungs suggesting emphysema and chronic bronchitic changes. No focal airspace disease or consolidation. No blunting of costophrenic angles. No pneumothorax. Mediastinal contours appear intact. Calcification of the aorta.  IMPRESSION: Emphysematous and chronic bronchitic changes in the lungs. No evidence of active pulmonary disease.   Electronically Signed  By: Burman Nieves M.D.   On: 06/22/2014 03:38     EKG Interpretation   Date/Time:  Saturday June 22 2014 03:50:59 EST Ventricular Rate:  72 PR Interval:  129 QRS Duration: 67 QT Interval:  446 QTC Calculation: 488 R Axis:   81 Text Interpretation:  Sinus rhythm Ventricular premature complex Anterior  infarct, old Minimal ST depression, diffuse leads No significant change  since last tracing Confirmed by Byford Schools  MD, Marris Frontera ([id number]) on 06/22/2014 4:32:06  AM      MDM   Final diagnoses:  Shortness of breath    Maria Oliver is a 54 y.o. female here with persistent cough and SOB despite being on steroids. Will give nebs and repeat labs and CXR and reassess.   5:24 AM After nebs, steroids, she ambulated and desat to 88% on RA. Will give more nebs and admit for COPD. Hypokalemic likely from not eating. K supplemented. Will admit to tele.   I personally performed the services described in this documentation, which was scribed in my presence. The recorded information has been reviewed and is accurate.   Richardean Canal, MD 06/22/14 [phone]

## 2014-06-23 ENCOUNTER — Inpatient Hospital Stay (HOSPITAL_COMMUNITY): Payer: No Typology Code available for payment source

## 2014-06-23 LAB — BASIC METABOLIC PANEL
ANION GAP: 9 (ref 5–15)
BUN: 8 mg/dL (ref 6–23)
CHLORIDE: 107 meq/L (ref 96–112)
CO2: 25 mmol/L (ref 19–32)
CREATININE: 0.62 mg/dL (ref 0.50–1.10)
Calcium: 8.5 mg/dL (ref 8.4–10.5)
GFR calc Af Amer: >90 mL/min (ref >90)
GFR calc non Af Amer: >90 mL/min (ref >90)
GLUCOSE: 107 mg/dL — AB (ref 70–99)
Potassium: 3.9 mmol/L (ref 3.5–5.1)
Sodium: 141 mmol/L (ref 135–145)

## 2014-06-23 LAB — CBC
HEMATOCRIT: 35.6 % — AB (ref 36.0–46.0)
Hemoglobin: 11.7 g/dL — ABNORMAL LOW (ref 12.0–15.0)
MCH: 29 pg (ref 26.0–34.0)
MCHC: 32.9 g/dL (ref 30.0–36.0)
MCV: 88.1 fL (ref 78.0–100.0)
PLATELETS: 307 10*3/uL (ref 150–400)
RBC: 4.04 MIL/uL (ref 3.87–5.11)
RDW: 15.2 % (ref 11.5–15.5)
WBC: 13.1 10*3/uL — ABNORMAL HIGH (ref 4.0–10.5)

## 2014-06-23 LAB — OCCULT BLOOD X 1 CARD TO LAB, STOOL: Fecal Occult Bld: NEGATIVE

## 2014-06-23 MED ORDER — POLYETHYLENE GLYCOL 3350 17 G PO PACK
17.0000 g | PACK | Freq: Every day | ORAL | Status: DC
  Administered 2014-06-23 – 2014-06-24 (×2): 17 g via ORAL
  Filled 2014-06-23 (×5): qty 1

## 2014-06-23 MED ORDER — DOCUSATE SODIUM 100 MG PO CAPS
100.0000 mg | ORAL_CAPSULE | Freq: Two times a day (BID) | ORAL | Status: DC
  Administered 2014-06-23 – 2014-06-26 (×5): 100 mg via ORAL
  Filled 2014-06-23 (×10): qty 1

## 2014-06-23 MED ORDER — METHYLPREDNISOLONE SODIUM SUCC 40 MG IJ SOLR
40.0000 mg | Freq: Four times a day (QID) | INTRAMUSCULAR | Status: DC
  Administered 2014-06-23 – 2014-06-26 (×12): 40 mg via INTRAVENOUS
  Filled 2014-06-23 (×17): qty 1

## 2014-06-23 NOTE — Progress Notes (Signed)
[facility] PROGRESS NOTE  Maria Oliver ZOX:[id number] DOB: [birth year] DOA: 06/22/2014 PCP: Doris CheadleADVANI, DEEPAK, MD  Assessment/Plan: 1. Community acquire pneumonia versus acute bacterial bronchitis -Patient presenting with clinical signs symptoms consistent with pneumonia although initial chest x-ray did not reveal acute infiltrate. -A repeat chest x-ray performed this morning show mild bibasilar opacities that likely reflect atelectasis per radiology -Clinically she seems to be improving today however continues to desat with ambulation -Continue empiric IV antimicrobial therapy with Ace azithromycin and ceftriaxone  2.  Chronic obstructive pulmonary disease exacerbation. -Patient having history of COPD as well as ongoing tobacco abuse -I suspect underlying infectious process precipitating COPD exacerbation -Patient continues to desat with ambulation, will schedule centimeter 60 g IV every 6 hours, continue duo nebs  3.   Active tobacco abuse -Patient was again counseled  4.  Hypokalemia -Initial lab work showing potassium of 2.7, 2.4 on repeat labs. -She received 4 rounds of IV potassium chloride along with a total of 80 mEq of K Dur on 06/22/2014 -Labs showing normalization of potassium at 3.9 today  Code Status: Full code Family Communication:  Disposition Plan: Anticipate discharge home when medically stable  Antibiotics:  Ceftriaxone  Azithromycin  HPI/Subjective: Patient is a pleasant 54 year old female with a past medical history of COPD, history of tobacco abuse, who was admitted to the medicine service on 06/22/2014 presenting with complaints of increasing cough, shortness of breath, wheezing. She was worked up with a chest x-ray which showed bronchitic changes in the lungs appear to be chronic. No evidence of active pulmonary disease. Symptoms attributed to community acquired pneumonia versus acute bacterial bronchitis as she was started on empiric IV antimicrobial  therapy with ceftriaxone and azithromycin. It was suspected that COPD exacerbation was contributing to her symptoms as IV steroids were ordered.  Objective: Filed Vitals:   06/23/14 1337  BP: 132/80  Pulse: 76  Temp: 98.6 F (37 C)  Resp: 18    Intake/Output Summary (Last 24 hours) at 06/23/14 1508 Last data filed at 06/23/14 1300  Gross per 24 hour  Intake 3876.66 ml  Output    900 ml  Net 2976.66 ml   Filed Weights   06/22/14 0655 06/23/14 0451  Weight: 66.452 kg (146 lb 8 oz) 68.13 kg (150 lb 3.2 oz)    Exam:   General:  Patient was admitted down the hallway where her saturations dropped to 88% on room air, otherwise seems improved  Cardiovascular: Regular rate and rhythm normal S1-S2  Respiratory: Coarse respiratory sounds, by lateral expiratory wheezes, diminished breath sounds, positive crackles  Abdomen: Soft nontender nondistended  Musculoskeletal: No edema   Data Reviewed: Basic Metabolic Panel:  Recent Labs Lab 06/19/14 1206 06/22/14 0342 06/22/14 0504 06/23/14 0426  NA 142 139 142 141  K 3.6 2.7* 2.4* 3.9  CL 106 97 98 107  CO2 26 27  --  25  GLUCOSE 96 148* 137* 107*  BUN 8 6 7 8   CREATININE 0.73 0.76 0.60 0.62  CALCIUM 9.4 9.3  --  8.5   Liver Function Tests:  Recent Labs Lab 06/19/14 1206 06/22/14 0342  AST 17 29  ALT 12 23  ALKPHOS 75 113  BILITOT 0.6 0.5  PROT 6.8 7.4  ALBUMIN 3.9 3.7   No results for input(s): LIPASE, AMYLASE in the last 168 hours. No results for input(s): AMMONIA in the last 168 hours. CBC:  Recent Labs Lab 06/19/14 1206 06/22/14 0342 06/22/14 0504 06/23/14 0426  WBC 15.7* 14.8*  --  13.1*  NEUTROABS 13.2* 13.2*  --   --   HGB 14.4 14.4 16.7* 11.7*  HCT 42.9 42.5 49.0* 35.6*  MCV 85.0 84.7  --  88.1  PLT 288 346  --  307   Cardiac Enzymes: No results for input(s): CKTOTAL, CKMB, CKMBINDEX, TROPONINI in the last 168 hours. BNP (last 3 results) No results for input(s): PROBNP in the last 8760  hours. CBG: No results for input(s): GLUCAP in the last 168 hours.  Recent Results (from the past 240 hour(s))  Urine culture     Status: None   Collection Time: 06/19/14 11:55 AM  Result Value Ref Range Status   Specimen Description URINE, RANDOM  Final   Special Requests NONE  Final   Colony Count NO GROWTH Performed at [facility]   Final   Culture NO GROWTH Performed at [facility]   Final   Report Status 06/20/2014 FINAL  Final  Culture, blood (routine x 2)     Status: None (Preliminary result)   Collection Time: 06/22/14  7:39 AM  Result Value Ref Range Status   Specimen Description BLOOD RIGHT ARM  Final   Special Requests BOTTLES DRAWN AEROBIC AND ANAEROBIC 10CC  Final   Culture   Final           BLOOD CULTURE RECEIVED NO GROWTH TO DATE CULTURE WILL BE HELD FOR 5 DAYS BEFORE ISSUING A FINAL NEGATIVE REPORT Performed at [facility]    Report Status PENDING  Incomplete  Culture, blood (routine x 2)     Status: None (Preliminary result)   Collection Time: 06/22/14  7:48 AM  Result Value Ref Range Status   Specimen Description BLOOD RIGHT ARM  Final   Special Requests BOTTLES DRAWN AEROBIC AND ANAEROBIC 10CC  Final   Culture   Final           BLOOD CULTURE RECEIVED NO GROWTH TO DATE CULTURE WILL BE HELD FOR 5 DAYS BEFORE ISSUING A FINAL NEGATIVE REPORT Performed at [facility]    Report Status PENDING  Incomplete     Studies: Dg Chest 2 View  06/23/2014   CLINICAL DATA:  Pneumonia  EXAM: CHEST  2 VIEW  COMPARISON:  06/22/2014  FINDINGS: Mild lobe is opacities, likely atelectasis. No focal consolidation. No pleural effusion or pneumothorax.  The heart is normal in size.  Visualized osseous structures are within normal limits.  IMPRESSION: Mild bibasilar opacities, likely atelectasis.   Electronically Signed   By: Charline Bills M.D.   On: 06/23/2014 07:48   Dg Chest 2 View  06/22/2014   CLINICAL DATA:  Cough and congestion.  Shortness of breath. Emesis. Generalized chest pain for 1 week. Smoker. Patient [facility] for same symptoms.  EXAM: CHEST  2 VIEW  COMPARISON:  06/19/2014  FINDINGS: Normal heart size and pulmonary vascularity. Mild hyperinflation and fat interstitial pattern to the lungs suggesting emphysema and chronic bronchitic changes. No focal airspace disease or consolidation. No blunting of costophrenic angles. No pneumothorax. Mediastinal contours appear intact. Calcification of the aorta.  IMPRESSION: Emphysematous and chronic bronchitic changes in the lungs. No evidence of active pulmonary disease.   Electronically Signed   By: Burman Nieves M.D.   On: 06/22/2014 03:38    Scheduled Meds: . azithromycin  500 mg Intravenous Q24H  . benzonatate  100 mg Oral Q8H  . cefTRIAXone (ROCEPHIN)  IV  1 g Intravenous Q24H  . docusate sodium  100 mg  Oral BID  . enoxaparin (LOVENOX) injection  40 mg Subcutaneous Q24H  . ipratropium-albuterol  3 mL Nebulization QID  . methylPREDNISolone (SOLU-MEDROL) injection  40 mg Intravenous Q6H  . PARoxetine  20 mg Oral Daily  . polyethylene glycol  17 g Oral Daily  . sodium chloride  3 mL Intravenous Q12H   Continuous Infusions:    Active Problems:   Essential hypertension   COPD (chronic obstructive pulmonary disease)   COPD exacerbation   Bronchitis   Hypoxemia   Hypokalemia    Time spent: 25 min    Jeralyn Bennett  [facility] Pager [phone]. If 7PM-7AM, please contact night-coverage at [email or web address], password [facility] 06/23/2014, 3:08 PM  LOS: 1 day

## 2014-06-23 NOTE — Progress Notes (Signed)
Patient is sleeping peacefully. Maintained on telemetry and is in NSR on the monitor. Will continue to monitor.  Janaye Corp, RN 

## 2014-06-24 DIAGNOSIS — J9621 Acute and chronic respiratory failure with hypoxia: Secondary | ICD-10-CM

## 2014-06-24 LAB — CBC
HCT: 35.5 % — ABNORMAL LOW (ref 36.0–46.0)
HEMOGLOBIN: 11.5 g/dL — AB (ref 12.0–15.0)
MCH: 27.8 pg (ref 26.0–34.0)
MCHC: 32.4 g/dL (ref 30.0–36.0)
MCV: 86 fL (ref 78.0–100.0)
PLATELETS: 327 10*3/uL (ref 150–400)
RBC: 4.13 MIL/uL (ref 3.87–5.11)
RDW: 15.3 % (ref 11.5–15.5)
WBC: 14 10*3/uL — ABNORMAL HIGH (ref 4.0–10.5)

## 2014-06-24 LAB — BASIC METABOLIC PANEL
ANION GAP: 3 — AB (ref 5–15)
BUN: 13 mg/dL (ref 6–23)
CALCIUM: 8.6 mg/dL (ref 8.4–10.5)
CO2: 27 mmol/L (ref 19–32)
Chloride: 108 mEq/L (ref 96–112)
Creatinine, Ser: 0.63 mg/dL (ref 0.50–1.10)
GFR calc Af Amer: >90 mL/min (ref >90)
GFR calc non Af Amer: >90 mL/min (ref >90)
Glucose, Bld: 135 mg/dL — ABNORMAL HIGH (ref 70–99)
POTASSIUM: 4 mmol/L (ref 3.5–5.1)
Sodium: 138 mmol/L (ref 135–145)

## 2014-06-24 LAB — URINALYSIS, ROUTINE W REFLEX MICROSCOPIC
BILIRUBIN URINE: NEGATIVE
GLUCOSE, UA: NEGATIVE mg/dL
HGB URINE DIPSTICK: NEGATIVE
KETONES UR: 15 mg/dL — AB
Leukocytes, UA: NEGATIVE
Nitrite: NEGATIVE
Protein, ur: NEGATIVE mg/dL
SPECIFIC GRAVITY, URINE: 1.02 (ref 1.005–1.030)
UROBILINOGEN UA: 0.2 mg/dL (ref 0.0–1.0)
pH: 6.5 (ref 5.0–8.0)

## 2014-06-24 LAB — OCCULT BLOOD X 1 CARD TO LAB, STOOL: Fecal Occult Bld: NEGATIVE

## 2014-06-24 MED ORDER — SODIUM CHLORIDE 0.9 % IV BOLUS (SEPSIS)
500.0000 mL | Freq: Once | INTRAVENOUS | Status: AC
  Administered 2014-06-24: 500 mL via INTRAVENOUS

## 2014-06-24 NOTE — Progress Notes (Signed)
Patient is sleeping peacefully. Continues to have productive cough of moderate amount of thick, yellowish sputum. Lungs are diminished in the b/l bases with scant crackles and expiratory wheezing noted in the b/l upper lobes. Maintained on 2 liters of oxygen via nasalcannula. Patient does become dyspneic with moderate exertion. Maintained on telemetry and is in NSR on the monitor. Will continue to monitor.  Sharlene Doryanya Indy Prestwood, RN

## 2014-06-24 NOTE — Progress Notes (Signed)
PROGRESS NOTE  Maria Oliver:[id number] DOB: 10-02-1960 DOA: 06/22/2014 PCP: [name], MD  HPI/Subjective: Maria Oliver is a 54 y.o. Caucasian female who presented to the ED on 06/22/2014 with increased SOB, cough, wheezing, and chest tightness over the past week..   According to admitting H&P: She has a history of HTN, Tobacco Use Disorder, with no formal diagnosis of COPD.  She reports coughing up yellowish sputum, and having nausea and some vomiting. She also reports that she has had night sweats and chills, and loss of appetite. She was evaluated in the ED a last week and placed on a prednisone taper and medications for her symptoms. She returned due to continued worsening. Her Chest X-ray has been negative for findings of pneumonia. She reports that she smoke 1ppd, but has not been able to smoke for the past 4 days due to her SOB.  More history gathered today: About 1 week ago she fell from a household ladder and landed on buttocks. She has pain on the right side that she describes as deep (not superficial) as if it were in her lung.  She says an xray showed nothing was broken. She says that at home she can walk around her house unassisted.  ROS: Gen: Feeling better today than yesterday; no change in appetite Pulm: SOB some at rest, more when walking to bathroom or talking. Coughing yellow sputum. Sharp/constant pain "in right lung" that is worse on expiration.  GI: No N/V; + constipation (pellet-shaped stool) Extremities: Fingers and feet a little puffy. GU: urine is "pure brown" or tea-colored, with a very bad smell   Assessment/Plan: 1. Community acquire pneumonia versus acute bacterial bronchitis -Patient presenting with clinical signs symptoms consistent with pneumonia although initial chest x-ray did not reveal acute infiltrate. -A repeat chest x-ray performed yesterday showed mild bibasilar opacities that likely reflect atelectasis per  radiology -Clinically she seems to be improving today however continues to desat with ambulation -Continue empiric IV antimicrobial therapy with azithromycin and ceftriaxone  2. Chronic obstructive pulmonary disease exacerbation. -Patient having history of COPD as well as ongoing tobacco abuse -I suspect underlying infectious process precipitating COPD exacerbation -O2sat at rest today 93-96% on 3L O2 ([place]). -Patient walked length of [address] and back with 3L O2 (nasal cannula), O2Sat ranged from 91-94%. -Patient continues to desat with ambulation, on scheduled SoluMedrol   IV every 6 hours, continue duo nebs and empiric IV antimicrobial therapy.  -Will repeat chest x-ray in a.m.  3. Active tobacco abuse -Patient was given tobacco abuse cessation counseling  4. Hypokalemia -Initial lab work showing potassium of 2.7, 2.4 on repeat labs. -She received 4 rounds of IV potassium chloride along with a total of 80 mEq of K Dur on 06/22/2014 -Labs showing normalization of potassium at 3.9 06/23/2014.  -No new labs drawn today    DVT Prophylaxis:  Enoxaparin (Lovenox) injection 40 mg, [place] Q24H  Code Status: Full code Family Communication: None Disposition Plan: Inpatient; possibly discharge to home in the next 24-48 hours.  Consultants:  None  Antibiotics:  Azithromycin 500 mg IV Q24H and Ceftriaxone 1 g IV Q24H  Objective: Filed Vitals:   06/24/14 0503 06/24/14 0753 06/24/14 1213 06/24/14 1423  BP: 132/75   116/60  Pulse: 58   79  Temp: 97.2 F (36.2 C)   98.2 F (36.8 C)  TempSrc: Tympanic   Oral  Resp: 18   18  Height:      Weight: 68.9 kg (  151 lb 14.4 oz)     SpO2: 98% 96% 96% 93%    Intake/Output Summary (Last 24 hours) at 06/24/14 1431 Last data filed at 06/24/14 1426  Gross per 24 hour  Intake    763 ml  Output    676 ml  Net     87 ml   Filed Weights   06/22/14 0655 06/23/14 0451 06/24/14 0503  Weight: 66.452 kg (146 lb 8 oz) 68.13 kg (150 lb 3.2  oz) 68.9 kg (151 lb 14.4 oz)    Exam: General: Well developed, well nourished, mild distress (breathing), appears stated age  HEENT:  Anicteic Sclera, MMM.   Neck: No JVD. Cardiovascular: RRR, S1 S2 auscultated, no rubs, murmurs or gallops.  Radial and pedal pulses 3+. Respiratory: Expiratory wheezes all throughout. Pt appears to have trouble talking. I observed coughing spells and opaque yellow sputum.  Abdomen: Large hernia on right side, sometimes tender; scar inferior to umbilicus from abdominal surgeries. Otherwise non-distended.   Extremities: warm dry without cyanosis or edema.  Fungus on right great toenail. Psych: Normal affect and demeanor with intact judgement and insight.   Data Reviewed: Basic Metabolic Panel:  Recent Labs Lab 06/19/14 1206 06/22/14 0342 06/22/14 0504 06/23/14 0426 06/24/14 0311  NA 142 139 142 141 138  K 3.6 2.7* 2.4* 3.9 4.0  CL 106 97 98 107 108  CO2 26 27  --  25 27  GLUCOSE 96 148* 137* 107* 135*  BUN 8 6 7 8 13   CREATININE 0.73 0.76 0.60 0.62 0.63  CALCIUM 9.4 9.3  --  8.5 8.6   Liver Function Tests:  Recent Labs Lab 06/19/14 1206 06/22/14 0342  AST 17 29  ALT 12 23  ALKPHOS 75 113  BILITOT 0.6 0.5  PROT 6.8 7.4  ALBUMIN 3.9 3.7   No results for input(s): LIPASE, AMYLASE in the last 168 hours. No results for input(s): AMMONIA in the last 168 hours. CBC:  Recent Labs Lab 06/19/14 1206 06/22/14 0342 06/22/14 0504 06/23/14 0426 06/24/14 0311  WBC 15.7* 14.8*  --  13.1* 14.0*  NEUTROABS 13.2* 13.2*  --   --   --   HGB 14.4 14.4 16.7* 11.7* 11.5*  HCT 42.9 42.5 49.0* 35.6* 35.5*  MCV 85.0 84.7  --  88.1 86.0  PLT 288 346  --  307 327   Cardiac Enzymes: No results for input(s): CKTOTAL, CKMB, CKMBINDEX, TROPONINI in the last 168 hours. BNP (last 3 results) No results for input(s): PROBNP in the last 8760 hours. CBG: No results for input(s): GLUCAP in the last 168 hours.  Recent Results (from the past 240 hour(s))   Urine culture     Status: None   Collection Time: 06/19/14 11:55 AM  Result Value Ref Range Status   Specimen Description URINE, RANDOM  Final   Special Requests NONE  Final   Colony Count NO GROWTH [facility]   Final   Culture NO GROWTH [facility]formed at [facility]   Final   Report Status 06/20/2014 FINAL  Final  Culture, blood (routine x 2)     Status: None (Preliminary result)   Collection Time: 06/22/14  7:39 AM  Result Value Ref Range Status   Specimen Description BLOOD RIGHT ARM  Final   Special Requests BOTTLES DRAWN AEROBIC AND ANAEROBIC 10CC  Final   Culture   Final           BLOOD CULTURE RECEIVED NO GROWTH TO DATE CULTURE  WILL BE HELD FOR 5 DAYS BEFORE ISSUING A FINAL NEGATIVE REPORT Performed at [facility]    Report Status PENDING  Incomplete  Culture, blood (routine x 2)     Status: None (Preliminary result)   Collection Time: 06/22/14  7:48 AM  Result Value Ref Range Status   Specimen Description BLOOD RIGHT ARM  Final   Special Requests BOTTLES DRAWN AEROBIC AND ANAEROBIC 10CC  Final   Culture   Final           BLOOD CULTURE RECEIVED NO GROWTH TO DATE CULTURE WILL BE HELD FOR 5 DAYS BEFORE ISSUING A FINAL NEGATIVE REPORT Performed at [facility]    Report Status PENDING  Incomplete     Studies: Dg Chest 2 View  06/23/2014   CLINICAL DATA:  Pneumonia  EXAM: CHEST  2 VIEW  COMPARISON:  06/22/2014  FINDINGS: Mild lobe is opacities, likely atelectasis. No focal consolidation. No pleural effusion or pneumothorax.  The heart is normal in size.  Visualized osseous structures are within normal limits.  IMPRESSION: Mild bibasilar opacities, likely atelectasis.   Electronically Signed   By: Charline Bills M.D.   On: 06/23/2014 07:48    Scheduled Meds: . azithromycin  500 mg Intravenous Q24H  . benzonatate  100 mg Oral Q8H  . cefTRIAXone (ROCEPHIN)  IV  1 g Intravenous Q24H  . docusate sodium  100 mg Oral BID  .  enoxaparin (LOVENOX) injection  40 mg Subcutaneous Q24H  . ipratropium-albuterol  3 mL Nebulization QID  . methylPREDNISolone (SOLU-MEDROL) injection  40 mg Intravenous Q6H  . PARoxetine  20 mg Oral Daily  . polyethylene glycol  17 g Oral Daily  . sodium chloride  3 mL Intravenous Q12H   Continuous Infusions:   Active Problems:   Essential hypertension   COPD (chronic obstructive pulmonary disease)   COPD exacerbation   Bronchitis   Hypoxemia   Hypokalemia      [facility] Pager [phone]. If 7PM-7AM, please contact night-coverage at [email or web address], password [facility] 06/24/2014, 2:31 PM  LOS: 2 days    Addendum  I personally evaluated patient on 06/24/2014 and agree with above findings. Patient is a pleasant 54 year old female with a past medical history of active tobacco abuse who was admitted on 06/22/2014 presenting with complaints of shortness of breath, cough, sputum production. She was started on IV steroids, IV and microbial therapy, duo nebs for COPD exacerbation that was likely precipitated by underlying infectious process. Patient showing gradual clinical improvement she was able to ambulate down the hallway today. She continues to require supplemental oxygen 3 L nasal cannula. On exam she continues to have bilateral expiratory wheezes, positive rhonchi, remains dyspneic. Will continue Solu-Medrol at 40 mg IV every 6 hours, ceftriaxone, azithromycin, scheduled duo nebs, reassess in a.m. Plan to repeat chest x-ray. Blood cultures today have remained negative 2 sets. Of note patient was noted to have a second drop in her hemoglobin from 16.7 on admission to 11.7. She was administered IV fluids however as suspicious by the possibility of underlying GI bleed causing some of control. Stools were guaiac and have remained negative 2 sets. Patient will likely require outpatient follow-up with GI.

## 2014-06-24 NOTE — Progress Notes (Signed)
UR completed Jaleel Allen K. Magalie Almon, RN, BSN, MSHL, CCM  06/24/2014 11:48 AM

## 2014-06-24 NOTE — Care Management Note (Addendum)
  Page 2 of 2   06/27/2014     4:40:40 PM CARE MANAGEMENT NOTE 06/27/2014  Patient:  Maria Oliver,Maria Oliver   Account Number:  [id number]  Date Initiated:  06/24/2014  Documentation initiated by:  Destynie Toomey  Subjective/Objective Assessment:   CP, COPD, CAP     Action/Plan:   CM to follow for disposition needs   Anticipated DC Date:  06/25/2014   Anticipated DC Plan:  HOME/SELF CARE      DC Planning Services  CM consult      PAC Choice  NA   Choice offered to / List presented to:  NA           Status of service:  Completed, signed off Medicare Important Message given?  NO (If response is "NO", the following Medicare IM given date fields will be blank) Date Medicare IM given:   Medicare IM given by:   Date Additional Medicare IM given:   Additional Medicare IM given by:    Discharge Disposition:  HOME/SELF CARE  Per UR Regulation:  Reviewed for med. necessity/level of care/duration of stay  If discussed at Long Length of Stay Meetings, dates discussed:    Comments:  Lynley Killilea RN, BSN, MSHL, CCM  Nurse - Case Manager,  ([address] [id number]) [phone]  06/27/2014 Social:  from home with adult son's Transportation:  Self or son's Insurance:  Self pay - Patient has been denied for MCD x 2. SSD application completed and pending. Medications:  [facility] Discount Card - (Patient aware she needs to renew card) PCP:  [facility]:  Dr. 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  States she now has a hernia since her reversal of the colonoscopy procedure but unable to have surgery until COPD, PNA resolved. Disposition:  Home / Self Care CM instructed to remain compliant in alll MD appt's CM advised in [facility] in order to get further medication asssitance. CM  [facility]SSD [facility]sit[facility]. CM [facility] at Oliver/c.

## 2014-06-25 ENCOUNTER — Inpatient Hospital Stay (HOSPITAL_COMMUNITY): Payer: No Typology Code available for payment source

## 2014-06-25 LAB — CBC
HCT: 40.4 % (ref 36.0–46.0)
Hemoglobin: 13.2 g/dL (ref 12.0–15.0)
MCH: 28.7 pg (ref 26.0–34.0)
MCHC: 32.7 g/dL (ref 30.0–36.0)
MCV: 87.8 fL (ref 78.0–100.0)
Platelets: 417 10*3/uL — ABNORMAL HIGH (ref 150–400)
RBC: 4.6 MIL/uL (ref 3.87–5.11)
RDW: 15.5 % (ref 11.5–15.5)
WBC: 17.3 10*3/uL — AB (ref 4.0–10.5)

## 2014-06-25 LAB — BASIC METABOLIC PANEL
ANION GAP: 8 (ref 5–15)
BUN: 14 mg/dL (ref 6–23)
CALCIUM: 8.4 mg/dL (ref 8.4–10.5)
CO2: 28 mmol/L (ref 19–32)
CREATININE: 0.73 mg/dL (ref 0.50–1.10)
Chloride: 102 mEq/L (ref 96–112)
Glucose, Bld: 107 mg/dL — ABNORMAL HIGH (ref 70–99)
Potassium: 3.6 mmol/L (ref 3.5–5.1)
Sodium: 138 mmol/L (ref 135–145)

## 2014-06-25 NOTE — Progress Notes (Signed)
[facility] PROGRESS NOTE  Maria Oliver ZOX:[id number] DOB: February 28, 1961 DOA: 06/22/2014 PCP: [name], MD  Interim summary Patient is a pleasant 54 year old female with a past medical history of COPD, history of tobacco abuse, who was admitted to the medicine service on 06/22/2014 presenting with complaints of increasing cough, shortness of breath, wheezing. She was worked up with a chest x-ray which showed bronchitic changes in the lungs appear to be chronic. No evidence of active pulmonary disease. Symptoms attributed to community acquired pneumonia versus acute bacterial bronchitis as she was started on empiric IV antimicrobial therapy with ceftriaxone and azithromycin. It was suspected that COPD exacerbation was contributing to her symptoms as IV steroids were ordered. She has shown slow improvement over the course of this hospitalization. On 06/25/2014 she was ambulated without oxygen with O2 sats dropping to 87-88%. She does not use home oxygen. On lung exam she continued to have diminished breath sounds with expiratory wheezing. Will plan to continue IV antibiotics, IV steroids, DuoNeb's, reassess in a.m. I do feel that she is improving and will likely be able to go home in the next 24-48 hours.  Assessment/Plan: 1. Community acquire pneumonia versus acute bacterial bronchitis -Patient presenting with clinical signs symptoms consistent with pneumonia although initial chest x-ray did not reveal acute infiltrate. -Clinically she seems to be improving today however continues to desat with ambulation -Repeat chest x-ray performed 06/25/2014 reported by radiology to have slightly more conspicuous pneumonia however clinically she appears improved. Will continue one more day of IV antimicrobial therapy, if remains afebrile and clinically improving transition to oral  antibiotics in a.m.  2.  Chronic obstructive pulmonary disease exacerbation. -Patient having history of COPD as well as  ongoing tobacco abuse -I suspect underlying infectious process precipitating COPD exacerbation -Patient continues to desat with ambulation, will schedule centimeter 60 g IV every 6 hours, continue duo nebs -Plan to transition to oral prednisone in the next 24 hours if she continues to improve.  3.   Active tobacco abuse -Patient was again counseled  4.  Hypokalemia -Initial lab work showing potassium of 2.7, 2.4 on repeat labs. -She received 4 rounds of IV potassium chloride along with a total of 80 mEq of K Dur on 06/22/2014 -Labs showing normalization of potassium   Code Status: Full code Family Communication:  Disposition Plan: Anticipate discharge home in the next 24-48 hours  Antibiotics:  Ceftriaxone  Azithromycin  HPI/Subjective: Patient patient states feeling a little better today. She ambulated down the hallway however saturations dropped to 8788%.  Objective: Filed Vitals:   06/25/14 1811  BP: 129/76  Pulse: 73  Temp: 97.9 F (36.6 C)  Resp: 20    Intake/Output Summary (Last 24 hours) at 06/25/14 1825 Last data filed at 06/25/14 1817  Gross per 24 hour  Intake   1320 ml  Output    175 ml  Net   1145 ml   Filed Weights   06/23/14 0451 06/24/14 0503 06/25/14 0606  Weight: 68.13 kg (150 lb 3.2 oz) 68.9 kg (151 lb 14.4 oz) 69.673 kg (153 lb 9.6 oz)    Exam:   General:  Patient was admitted down the hallway where her saturations dropped to 88% on room air, otherwise seems improved  Cardiovascular: Regular rate and rhythm normal S1-S2  Respiratory: Coarse respiratory sounds, by lateral expiratory wheezes, diminished breath sounds, positive crackles  Abdomen: Soft nontender nondistended  Musculoskeletal: No edema   Data Reviewed: Basic Metabolic Panel:  Recent Labs Lab 06/19/14  1206 06/22/14 0342 06/22/14 0504 06/23/14 0426 06/24/14 0311 06/25/14 0618  NA 142 139 142 141 138 138  K 3.6 2.7* 2.4* 3.9 4.0 3.6  CL 106 97 98 107 108 102  CO2  26 27  --  GLUCOSE 96 148* 137* 107* 135* 107*  BUN CREATININE 0.73 0.76 0.60 0.62 0.63 0.73  CALCIUM 9.4 9.3  --  8.5 8.6 8.4   Liver Function Tests:  Recent Labs Lab 06/19/14 1206 06/22/14 0342  AST 17 29  ALT 12 23  ALKPHOS 75 113  BILITOT 0.6 0.5  PROT 6.8 7.4  ALBUMIN 3.9 3.7   No results for input(s): LIPASE, AMYLASE in the last 168 hours. No results for input(s): AMMONIA in the last 168 hours. CBC:  Recent Labs Lab 06/19/14 1206 06/22/14 0342 06/22/14 0504 06/23/14 0426 06/24/14 0311 06/25/14 0618  WBC 15.7* 14.8*  --  13.1* 14.0* 17.3*  NEUTROABS 13.2* 13.2*  --   --   --   --   HGB 14.4 14.4 16.7* 11.7* 11.5* 13.2  HCT 42.9 42.5 49.0* 35.6* 35.5* 40.4  MCV 85.0 84.7  --  88.1 86.0 87.8  PLT 288 346  --  307 327 417*   Cardiac Enzymes: No results for input(s): CKTOTAL, CKMB, CKMBINDEX, TROPONINI in the last 168 hours. BNP (last 3 results) No results for input(s): PROBNP in the last 8760 hours. CBG: No results for input(s): GLUCAP in the last 168 hours.  Recent Results (from the past 240 hour(s))  Urine culture     Status: None   Collection Time: 06/19/14 11:55 AM  Result Value Ref Range Status   Specimen Description URINE, RANDOM  Final   Special Requests NONE  Final   Colony Count NO GROWTH Performed at [facility]   Final   Culture NO GROWTH Performed at [facility]   Final   Report Status 06/20/2014 FINAL  Final  Culture, blood (routine x 2)     Status: None (Preliminary result)   Collection Time: 06/22/14  7:39 AM  Result Value Ref Range Status   Specimen Description BLOOD RIGHT ARM  Final   Special Requests BOTTLES DRAWN AEROBIC AND ANAEROBIC 10CC  Final   Culture   Final           BLOOD CULTURE RECEIVED NO GROWTH TO DATE CULTURE WILL BE HELD FOR 5 DAYS BEFORE ISSUING A FINAL NEGATIVE REPORT Performed at [facility]    Report Status PENDING  Incomplete  Culture, blood (routine x 2)      Status: None (Preliminary result)   Collection Time: 06/22/14  7:48 AM  Result Value Ref Range Status   Specimen Description BLOOD RIGHT ARM  Final   Special Requests BOTTLES DRAWN AEROBIC AND ANAEROBIC 10CC  Final   Culture   Final           BLOOD CULTURE RECEIVED NO GROWTH TO DATE CULTURE WILL BE HELD FOR 5 DAYS BEFORE ISSUING A FINAL NEGATIVE REPORT Performed at [facility]    Report Status PENDING  Incomplete     Studies: Dg Chest 2 View  06/25/2014   CLINICAL DATA:  Community-acquired pneumonia with persistent shortness of breath and weakness; history of COPD.  EXAM: CHEST  2 VIEW  COMPARISON:  PA and lateral chest x-ray of June 23, 2014.  FINDINGS: The lungs remain hyperinflated with persistently increased interstitial markings diffusely. There is patchy increased  density in the left mid upper lung field which is more conspicuous today. The interstitial markings remain increased bilaterally. There is no pleural effusion or pneumothorax. The cardiac silhouette is normal in size. The pulmonary vascularity is not engorged. The bony thorax is unremarkable.  IMPRESSION: COPD with interstitial pneumonia in the left mid/upper lung which is slightly more conspicuous today. There is no CHF nor pleural effusion.   Electronically Signed   By: David  [place]   On: 06/25/2014 08:14    Scheduled Meds: . azithromycin  500 mg Intravenous Q24H  . benzonatate  100 mg Oral Q8H  . cefTRIAXone (ROCEPHIN)  IV  1 g Intravenous Q24H  . docusate sodium  100 mg Oral BID  . enoxaparin (LOVENOX) injection  40 mg Subcutaneous Q24H  . ipratropium-albuterol  3 mL Nebulization QID  . methylPREDNISolone (SOLU-MEDROL) injection  40 mg Intravenous Q6H  . PARoxetine  20 mg Oral Daily  . polyethylene glycol  17 g Oral Daily  . sodium chloride  3 mL Intravenous Q12H   Continuous Infusions:    Active Problems:   Essential hypertension   COPD (chronic obstructive pulmonary disease)   COPD exacerbation    Bronchitis   Hypoxemia   Hypokalemia    Time spent: 30 min    Jeralyn BennettZAMORA, Candice Lunney  [facility] Pager [phone]. If 7PM-7AM, please contact night-coverage at [email or web address], password [email or web address] 06/25/2014, 6:25 PM  LOS: 3 days

## 2014-06-25 NOTE — Progress Notes (Signed)
Pt requesting pain medicine for moderate pain 6/10 in back and right leg. This RN asked pt if she would like oxycodone for her pain. Pt states "im allergic to codeine". Pt educated that oxycodone is not the same as codeine and codeine is listed as an allergy in the computer. Pt states she will only take IV pain medicine and that she takes ibuprofen at home. RN brought IV pain medicine to room and explained to patient that IV pain medicine works more quickly than the pain pill, but the pain pill usually relieves pain for a longer period of time, and that she always has this option. Patient sits up in bed and in a raised voice tells RN to leave the room and that "this is not my first rodeo, I hate whiny people" patient then told RN that if she is given any form of codeine that upsets her stomach that she will "come up front and choke you out". RN informed patient that she has no codeine ordered for her, and pt allowed nurse to give her dilaudid.Will continue to monitor. Huel Coventryosenberger, Meagan Ancona A

## 2014-06-26 DIAGNOSIS — J189 Pneumonia, unspecified organism: Secondary | ICD-10-CM

## 2014-06-26 DIAGNOSIS — J9601 Acute respiratory failure with hypoxia: Secondary | ICD-10-CM

## 2014-06-26 MED ORDER — METHYLPREDNISOLONE SODIUM SUCC 125 MG IJ SOLR
60.0000 mg | Freq: Two times a day (BID) | INTRAMUSCULAR | Status: DC
  Administered 2014-06-26 – 2014-06-27 (×2): 60 mg via INTRAVENOUS
  Filled 2014-06-26: qty 0.96
  Filled 2014-06-26 (×2): qty 2
  Filled 2014-06-26: qty 0.96

## 2014-06-26 NOTE — Progress Notes (Signed)
Pt resting comfortably in bed this AM. Pt A/Ox4 and pleasant. Pt BP in 150s overnight, pt states she will speak with MD this AM regarding blood pressure medicine. Pt asymptomatic. Pt IV leaking, unable to restart IV and IV team paged. Will continue to monitor. Huel Coventryosenberger, Marty Uy A, RN

## 2014-06-26 NOTE — Progress Notes (Signed)
[facility] PROGRESS NOTE  [name]:[id number] DOB: Nov 28, 1960 DOA: 06/22/2014 PCP: Maria Cheadle, MD  Interim summary Patient is a pleasant 54 year old female with a past medical history of COPD, history of tobacco abuse, who was admitted to the medicine service on 06/22/2014 presenting with complaints of increasing cough, shortness of breath, wheezing. She was worked up with a chest x-ray which showed bronchitic changes in the lungs appear to be chronic. No evidence of active pulmonary disease. Symptoms attributed to community acquired pneumonia versus acute bacterial bronchitis as she was started on empiric IV antimicrobial therapy with ceftriaxone and azithromycin. It was suspected that COPD exacerbation was contributing to her symptoms as IV steroids were ordered. She has shown slow improvement over the course of this hospitalization. On 06/25/2014 she was ambulated without oxygen with O2 sats dropping to 87-88%. She does not use home oxygen. On lung exam she continued to have diminished breath sounds with expiratory wheezing. Will plan to continue IV antibiotics, IV steroids, DuoNeb's, reassess in a.m. I do feel that she is improving and will likely be able to go home in the next 24-48 hours.  Assessment/Plan: 1. Community acquire pneumonia versus acute bacterial bronchitis -Patient presenting with clinical signs symptoms consistent with pneumonia although initial chest x-ray did not reveal acute infiltrate. -Clinically she seems to be improving today however continued to desat with ambulation -Repeat chest x-ray performed 06/25/2014 reported by radiology to have slightly more conspicuous pneumonia however clinically she appears improved. Will continue one more day of IV antimicrobial therapy until 1/14, if remains afebrile and clinically improving transition to oral  antibiotics in a.m.  2.  Chronic obstructive pulmonary disease exacerbation. -Patient having history of COPD as  well as ongoing tobacco abuse - suspect underlying infectious process precipitating COPD exacerbation - Patient continued to desat with ambulation, will schedule centimeter 60 g IV every 6 hours, continue duo nebs - check desat eval and reduce steroids  3.   Active tobacco abuse -Patient was again counseled  4.  Hypokalemia -Initial lab work showing potassium of 2.7, 2.4 on repeat labs. -She received 4 rounds of IV potassium chloride along with a total of 80 mEq of K Dur on 06/22/2014 -Labs showing normalization of potassium   Code Status: Full code Family Communication: None at bedside Disposition Plan: Anticipate discharge home in the next 24 hours  Antibiotics:  Ceftriaxone  Azithromycin  HPI/Subjective: Patient continues to feel better. Mild dry cough. Dyspnea improved but not yet at baseline/DOE. No chest pain.  Objective: Filed Vitals:   06/26/14 0544 06/26/14 0929 06/26/14 1020 06/26/14 1330  BP: 154/88  140/80 129/74  Pulse:   72 60  Temp:  97.6 F (36.4 C) 97.6 F (36.4 C) 97.7 F (36.5 C)  TempSrc:   Oral Oral  Resp:   18 20  Height:      Weight:      SpO2:   91% 95%     Intake/Output Summary (Last 24 hours) at 06/26/14 1649 Last data filed at 06/26/14 1413  Gross per 24 hour  Intake   1140 ml  Output    300 ml  Net    840 ml   Filed Weights   06/24/14 0503 06/25/14 0606 06/26/14 0517  Weight: 68.9 kg (151 lb 14.4 oz) 69.673 kg (153 lb 9.6 oz) 70.398 kg (155 lb 3.2 oz)    Exam:   General:  Patient lying comfortably supine in bed.  Cardiovascular: Regular rate and rhythm normal S1-S2. No  JVD or pedal edema. Telemetry: Sinus bradycardia in the 50s-sinus rhythm.  Respiratory: Fair breath sounds. Few posterior medium pitched expiratory rhonchi. No increased work of breathing. Able to speak in full sentences.  Abdomen: Soft nontender nondistended  Musculoskeletal: No edema   CNS: Alert and oriented. No focal deficits.  Data Reviewed: Basic  Metabolic Panel:  Recent Labs Lab 06/22/14 0342 06/22/14 0504 06/23/14 0426 06/24/14 0311 06/25/14 0618  NA 139 142 141 138 138  K 2.7* 2.4* 3.9 4.0 3.6  CL 97 98 107 108 102  CO2 27  --  GLUCOSE 148* 137* 107* 135* 107*  BUN CREATININE 0.76 0.60 0.62 0.63 0.73  CALCIUM 9.3  --  8.5 8.6 8.4   Liver Function Tests:  Recent Labs Lab 06/22/14 0342  AST 29  ALT 23  ALKPHOS 113  BILITOT 0.5  PROT 7.4  ALBUMIN 3.7   No results for input(s): LIPASE, AMYLASE in the last 168 hours. No results for input(s): AMMONIA in the last 168 hours. CBC:  Recent Labs Lab 06/22/14 0342 06/22/14 0504 06/23/14 0426 06/24/14 0311 06/25/14 0618  WBC 14.8*  --  13.1* 14.0* 17.3*  NEUTROABS 13.2*  --   --   --   --   HGB 14.4 16.7* 11.7* 11.5* 13.2  HCT 42.5 49.0* 35.6* 35.5* 40.4  MCV 84.7  --  88.1 86.0 87.8  PLT 346  --  307 327 417*   Cardiac Enzymes: No results for input(s): CKTOTAL, CKMB, CKMBINDEX, TROPONINI in the last 168 hours. BNP (last 3 results) No results for input(s): PROBNP in the last 8760 hours. CBG: No results for input(s): GLUCAP in the last 168 hours.  Recent Results (from the past 240 hour(s))  Urine culture     Status: None   Collection Time: 06/19/14 11:55 AM  Result Value Ref Range Status   Specimen Description URINE, RANDOM  Final   Special Requests NONE  Final   Colony Count NO GROWTH Performed at [facility]   Final   Culture NO GROWTH Performed at [facility]   Final   Report Status 06/20/2014 FINAL  Final  Culture, blood (routine x 2)     Status: None (Preliminary result)   Collection Time: 06/22/14  7:39 AM  Result Value Ref Range Status   Specimen Description BLOOD RIGHT ARM  Final   Special Requests BOTTLES DRAWN AEROBIC AND ANAEROBIC 10CC  Final   Culture   Final           BLOOD CULTURE RECEIVED NO GROWTH TO DATE CULTURE WILL BE HELD FOR 5 DAYS BEFORE ISSUING A FINAL NEGATIVE REPORT Performed at  [facility]    Report Status PENDING  Incomplete  Culture, blood (routine x 2)     Status: None (Preliminary result)   Collection Time: 06/22/14  7:48 AM  Result Value Ref Range Status   Specimen Description BLOOD RIGHT ARM  Final   Special Requests BOTTLES DRAWN AEROBIC AND ANAEROBIC 10CC  Final   Culture   Final           BLOOD CULTURE RECEIVED NO GROWTH TO DATE CULTURE WILL BE HELD FOR 5 DAYS BEFORE ISSUING A FINAL NEGATIVE REPORT Performed at [facility]    Report Status PENDING  Incomplete     Studies: Dg Chest 2 View  06/25/2014   CLINICAL DATA:  Community-acquired pneumonia with persistent shortness of breath and weakness; history  of COPD.  EXAM: CHEST  2 VIEW  COMPARISON:  PA and lateral chest x-ray of June 23, 2014.  FINDINGS: The lungs remain hyperinflated with persistently increased interstitial markings diffusely. There is patchy increased density in the left mid upper lung field which is more conspicuous today. The interstitial markings remain increased bilaterally. There is no pleural effusion or pneumothorax. The cardiac silhouette is normal in size. The pulmonary vascularity is not engorged. The bony thorax is unremarkable.  IMPRESSION: COPD with interstitial pneumonia in the left mid/upper lung which is slightly more conspicuous today. There is no CHF nor pleural effusion.   Electronically Signed   By: David  [place]   On: 06/25/2014 08:14    Scheduled Meds: . azithromycin  500 mg Intravenous Q24H  . benzonatate  100 mg Oral Q8H  . cefTRIAXone (ROCEPHIN)  IV  1 g Intravenous Q24H  . docusate sodium  100 mg Oral BID  . enoxaparin (LOVENOX) injection  40 mg Subcutaneous Q24H  . ipratropium-albuterol  3 mL Nebulization QID  . methylPREDNISolone (SOLU-MEDROL) injection  60 mg Intravenous Q12H  . PARoxetine  20 mg Oral Daily  . polyethylene glycol  17 g Oral Daily  . sodium chloride  3 mL Intravenous Q12H   Continuous Infusions:    Active Problems:    Essential hypertension   COPD (chronic obstructive pulmonary disease)   COPD exacerbation   Bronchitis   Hypoxemia   Hypokalemia    Time spent: 30 min   Maria Whitefield, MD, FACP, FHM. [facility] Pager [phone]  If 7PM-7AM, please contact night-coverage [email or web address] Password [email or web address] 06/26/2014, 4:53 PM    LOS: 4 days

## 2014-06-27 MED ORDER — HYDRALAZINE HCL 20 MG/ML IJ SOLN: 10.0000 mg | Freq: Four times a day (QID) | INTRAMUSCULAR | Status: DC | PRN

## 2014-06-27 MED ORDER — HYDROCHLOROTHIAZIDE 25 MG PO TABS
25.0000 mg | ORAL_TABLET | Freq: Every day | ORAL | Status: DC
  Administered 2014-06-27: 25 mg via ORAL
  Filled 2014-06-27: qty 1

## 2014-06-27 MED ORDER — IPRATROPIUM-ALBUTEROL 0.5-2.5 (3) MG/3ML IN SOLN: 3.0000 mL | Freq: Three times a day (TID) | RESPIRATORY_TRACT | Status: DC

## 2014-06-27 NOTE — Progress Notes (Addendum)
SATURATION QUALIFICATIONS: (This note is used to comply with regulatory documentation for home oxygen)  Patient Saturations on Room Air at Rest = 92 %  Patient Saturations on Room Air while Ambulating = 92%  Patient Saturations on 0 Liters of oxygen while Ambulating = N/A  Please briefly explain why patient needs home oxygen: Pt. Did not require Oxygen while ambulating and did not drop below 92% O2 Saturation Level.

## 2014-06-27 NOTE — Progress Notes (Signed)
UR completed Qais Jowers K. Tylyn Stankovich, RN, BSN, MSHL, CCM  06/27/2014 3:54 PM

## 2014-06-27 NOTE — Progress Notes (Signed)
Pt BP elevated this AM. Dr. Waymon AmatoHongalgi notified via amion textpage, report given to day shift RN.

## 2014-06-27 NOTE — Progress Notes (Signed)
Patient ambulatory out of hospital for discharge.  Son here to take patient home.

## 2014-06-27 NOTE — Discharge Instructions (Signed)
Pneumonia °Pneumonia is an infection of the lungs.  °CAUSES °Pneumonia may be caused by bacteria or a virus. Usually, these infections are caused by breathing infectious particles into the lungs (respiratory tract). °SIGNS AND SYMPTOMS  °· Cough. °· Fever. °· Chest pain. °· Increased rate of breathing. °· Wheezing. °· Mucus production. °DIAGNOSIS  °If you have the common symptoms of pneumonia, your health care provider will typically confirm the diagnosis with a chest X-ray. The X-ray will show an abnormality in the lung (pulmonary infiltrate) if you have pneumonia. Other tests of your blood, urine, or sputum may be done to find the specific cause of your pneumonia. Your health care provider may also do tests (blood gases or pulse oximetry) to see how well your lungs are working. °TREATMENT  °Some forms of pneumonia may be spread to other people when you cough or sneeze. You may be asked to wear a mask before and during your exam. Pneumonia that is caused by bacteria is treated with antibiotic medicine. Pneumonia that is caused by the influenza virus may be treated with an antiviral medicine. Most other viral infections must run their course. These infections will not respond to antibiotics.  °HOME CARE INSTRUCTIONS  °· Cough suppressants may be used if you are losing too much rest. However, coughing protects you by clearing your lungs. You should avoid using cough suppressants if you can. °· Your health care provider may have prescribed medicine if he or she thinks your pneumonia is caused by bacteria or influenza. Finish your medicine even if you start to feel better. °· Your health care provider may also prescribe an expectorant. This loosens the mucus to be coughed up. °· Take medicines only as directed by your health care provider. °· Do not smoke. Smoking is a common cause of bronchitis and can contribute to pneumonia. If you are a smoker and continue to smoke, your cough may last several weeks after your  pneumonia has cleared. °· A cold steam vaporizer or humidifier in your room or home may help loosen mucus. °· Coughing is often worse at night. Sleeping in a semi-upright position in a recliner or using a couple pillows under your head will help with this. °· Get rest as you feel it is needed. Your body will usually let you know when you need to rest. °PREVENTION °A pneumococcal shot (vaccine) is available to prevent a common bacterial cause of pneumonia. This is usually suggested for: °· People over 65 years old. °· Patients on chemotherapy. °· People with chronic lung problems, such as bronchitis or emphysema. °· People with immune system problems. °If you are over 65 or have a high risk condition, you may receive the pneumococcal vaccine if you have not received it before. In some countries, a routine influenza vaccine is also recommended. This vaccine can help prevent some cases of pneumonia. You may be offered the influenza vaccine as part of your care. °If you smoke, it is time to quit. You may receive instructions on how to stop smoking. Your health care provider can provide medicines and counseling to help you quit. °SEEK MEDICAL CARE IF: °You have a fever. °SEEK IMMEDIATE MEDICAL CARE IF:  °· Your illness becomes worse. This is especially true if you are elderly or weakened from any other disease. °· You cannot control your cough with suppressants and are losing sleep. °· You begin coughing up blood. °· You develop pain which is getting worse or is uncontrolled with medicines. °· Any of the symptoms   which initially brought you in for treatment are getting worse rather than better. °· You develop shortness of breath or chest pain. °MAKE SURE YOU:  °· Understand these instructions. °· Will watch your condition. °· Will get help right away if you are not doing well or get worse. °Document Released: 05/31/2005 Document Revised: 10/15/2013 Document Reviewed: 08/20/2010 °ExitCare® Patient Information ©2015  [employer]. This information is not intended to replace advice given to you by your health care provider. Make sure you discuss any questions you have with your health care provider. ° ° °Chronic Obstructive Pulmonary Disease °Chronic obstructive pulmonary disease (COPD) is a common lung condition in which airflow from the lungs is limited. COPD is a general term that can be used to describe many different lung problems that limit airflow, including both chronic bronchitis and emphysema.  If you have COPD, your lung function will probably never return to normal, but there are measures you can take to improve lung function and make yourself feel better.  °CAUSES  °· Smoking (common).   °· Exposure to secondhand smoke.   °· Genetic problems. °· Chronic inflammatory lung diseases or recurrent infections. °SYMPTOMS  °· Shortness of breath, especially with physical activity.   °· Deep, persistent (chronic) cough with a large amount of thick mucus.   °· Wheezing.   °· Rapid breaths (tachypnea).   °· Gray or bluish discoloration (cyanosis) of the skin, especially in fingers, toes, or lips.   °· Fatigue.   °· Weight loss.   °· Frequent infections or episodes when breathing symptoms become much worse (exacerbations).   °· Chest tightness. °DIAGNOSIS  °Your health care provider will take a medical history and perform a physical examination to make the initial diagnosis.  Additional tests for COPD may include:  °· Lung (pulmonary) function tests. °· Chest X-ray. °· CT scan. °· Blood tests. °TREATMENT  °Treatment available to help you feel better when you have COPD includes:  °· Inhaler and nebulizer medicines. These help manage the symptoms of COPD and make your breathing more comfortable. °· Supplemental oxygen. Supplemental oxygen is only helpful if you have a low oxygen level in your blood.   °· Exercise and physical activity. These are beneficial for nearly all people with COPD. Some people may also benefit from a  pulmonary rehabilitation program. °HOME CARE INSTRUCTIONS  °· Take all medicines (inhaled or pills) as directed by your health care provider. °· Avoid over-the-counter medicines or cough syrups that dry up your airway (such as antihistamines) and slow down the elimination of secretions unless instructed otherwise by your health care provider.   °· If you are a smoker, the most important thing that you can do is stop smoking. Continuing to smoke will cause further lung damage and breathing trouble. Ask your health care provider for help with quitting smoking. He or she can direct you to community resources or hospitals that provide support. °· Avoid exposure to irritants such as smoke, chemicals, and fumes that aggravate your breathing. °· Use oxygen therapy and pulmonary rehabilitation if directed by your health care provider. If you require home oxygen therapy, ask your health care provider whether you should purchase a pulse oximeter to measure your oxygen level at home.   °· Avoid contact with individuals who have a contagious illness. °· Avoid extreme temperature and humidity changes. °· Eat healthy foods. Eating smaller, more frequent meals and resting before meals may help you maintain your strength. °· Stay active, but balance activity with periods of rest. Exercise and physical activity will help you maintain your ability to do   things you want to do. °· Preventing infection and hospitalization is very important when you have COPD. Make sure to receive all the vaccines your health care provider recommends, especially the pneumococcal and influenza vaccines. Ask your health care provider whether you need a pneumonia vaccine. °· Learn and use relaxation techniques to manage stress. °· Learn and use controlled breathing techniques as directed by your health care provider. Controlled breathing techniques include:   °¨ Pursed lip breathing. Start by breathing in (inhaling) through your nose for 1 second. Then,  purse your lips as if you were going to whistle and breathe out (exhale) through the pursed lips for 2 seconds.   °¨ Diaphragmatic breathing. Start by putting one hand on your abdomen just above your waist. Inhale slowly through your nose. The hand on your abdomen should move out. Then purse your lips and exhale slowly. You should be able to feel the hand on your abdomen moving in as you exhale.   °· Learn and use controlled coughing to clear mucus from your lungs. Controlled coughing is a series of short, progressive coughs. The steps of controlled coughing are:   °1. Lean your head slightly forward.   °2. Breathe in deeply using diaphragmatic breathing.   °3. Try to hold your breath for 3 seconds.   °4. Keep your mouth slightly open while coughing twice.   °5. Spit any mucus out into a tissue.   °6. Rest and repeat the steps once or twice as needed. °SEEK MEDICAL CARE IF:  °· You are coughing up more mucus than usual.   °· There is a change in the color or thickness of your mucus.   °· Your breathing is more labored than usual.   °· Your breathing is faster than usual.   °SEEK IMMEDIATE MEDICAL CARE IF:  °· You have shortness of breath while you are resting.   °· You have shortness of breath that prevents you from: °¨ Being able to talk.   °¨ Performing your usual physical activities.   °· You have chest pain lasting longer than 5 minutes.   °· Your skin color is more cyanotic than usual. °· You measure low oxygen saturations for longer than 5 minutes with a pulse oximeter. °MAKE SURE YOU:  °· Understand these instructions. °· Will watch your condition. °· Will get help right away if you are not doing well or get worse. °Document Released: 03/10/2005 Document Revised: 10/15/2013 Document Reviewed: 01/25/2013 °ExitCare® Patient Information ©2015 [employer]. This information is not intended to replace advice given to you by your health care provider. Make sure you discuss any questions you have with your health  care provider. ° °

## 2014-06-27 NOTE — Discharge Summary (Signed)
Physician Discharge Summary  Maria Oliver:[id number] DOB: July 09, 1960 DOA: 06/22/2014  PCP: [name], MD  Admit date: 06/22/2014 Discharge date: 06/27/2014  Time spent: Greater than 30 minutes  Recommendations for Outpatient Follow-up:  1. Dr. [name], PCP in week. Please follow final blood culture results sent from hospital. 2. Consider OP Pulmonary consultation. 3. Recommend repeating chest x-ray in 4-6 weeks to ensure resolution of pneumonia findings.  Discharge Diagnoses:  Active Problems:   Essential hypertension   COPD (chronic obstructive pulmonary disease)   COPD exacerbation   Bronchitis   Hypoxemia   Hypokalemia   Discharge Condition: Improved & Stable  Diet recommendation: Heart Healthy diet.  Filed Weights   06/25/14 0606 06/26/14 0517 06/27/14 0659  Weight: 69.673 kg (153 lb 9.6 oz) 70.398 kg (155 lb 3.2 oz) 70.5 kg (155 lb 6.8 oz)    History of present illness:  Patient is a pleasant 54 year old female with a past medical history of COPD, history of tobacco abuse, who was admitted to the medicine service on 06/22/2014 presenting with complaints of increasing cough, shortness of breath, wheezing. She was worked up with a chest x-ray which showed bronchitic changes in the lungs appear to be chronic. No evidence of active pulmonary disease. Symptoms attributed to community acquired pneumonia versus AECOPD and she was admitted for further management.   Hospital Course:   1. Community-acquired pneumonia: Treated empirically with IV Rocephin and azithromycin. She has completed 7 days course of antibiotics today. Clinically improved. Afebrile. Leukocytosis possibly secondary to steroids. Blood cultures negative to date. 2. COPD exacerbation: Ongoing tobacco abuse prior to admission he tobacco cessation counseled. Treated with antibiotics, oxygen, bronchodilator nebulizations and IV steroids. Clinically improved. Breathing back to baseline. Hypoxia resolved.  Complete oral prednisone taper that she had started PTA on 1/8. 3. Acute hypoxic respiratory failure: Secondary to problem #1 and 2. Resolved. 4. Tobacco abuse: Cessation counseled. 5. Hypokalemia: Replaced. 6. Leukocytosis: Likely secondary to steroids. 7. Uncontrolled hypertension: HCTZ had been held which will be resumed today. Some of it may have been precipitated by steroids inpatient. Outpatient follow-up with PCP.  Consultations:  None  Procedures:  None    Discharge Exam:  Complaints:  Anxious to go home. Denies dyspnea. Minimal intermittent dry cough. No fever or chest pain.  Filed Vitals:   06/27/14 0702 06/27/14 0847 06/27/14 1028 06/27/14 1237  BP: 166/102  149/85   Pulse:   68   Temp:   98.1 F (36.7 C)   TempSrc:   Oral   Resp:      Height:      Weight:      SpO2:  96% 96% 93%   respiratory rate: 18/m  General exam: Pleasant middle-aged female sitting up comfortably in bed. Respiratory system: Clear. No increased work of breathing. Cardiovascular system: S1 & S2 heard, RRR. No JVD, murmurs, gallops, clicks or pedal edema. Telemetry: Sinus rhythm. Gastrointestinal system: Abdomen is nondistended, soft and nontender. Normal bowel sounds heard. Central nervous system: Alert and oriented. No focal neurological deficits. Extremities: Symmetric 5 x 5 power.  Discharge Instructions      Discharge Instructions    Call MD for:  difficulty breathing, headache or visual disturbances    Complete by:  As directed      Call MD for:  temperature >100.4    Complete by:  As directed      Diet - low sodium heart healthy    Complete by:  As directed  Increase activity slowly    Complete by:  As directed             Medication List    STOP taking these medications        fluconazole 150 MG tablet  Commonly known as:  DIFLUCAN     fluticasone 50 MCG/ACT nasal spray  Commonly known as:  FLONASE     ondansetron 4 MG tablet  Commonly known as:  ZOFRAN      terbinafine 250 MG tablet  Commonly known as:  LAMISIL     traMADol 50 MG tablet  Commonly known as:  ULTRAM      TAKE these medications        albuterol 108 (90 BASE) MCG/ACT inhaler  Commonly known as:  PROVENTIL HFA;VENTOLIN HFA  Inhale 1-2 puffs into the lungs every 6 (six) hours as needed for wheezing or shortness of breath.     benzonatate 100 MG capsule  Commonly known as:  TESSALON  Take 1 capsule (100 mg total) by mouth every 8 (eight) hours.     hydrochlorothiazide 25 MG tablet  Commonly known as:  HYDRODIURIL  Take 25 mg by mouth daily.     ibuprofen 600 MG tablet  Commonly known as:  ADVIL,MOTRIN  Take 1 tablet (600 mg total) by mouth every 8 (eight) hours as needed for mild pain or moderate pain.     PARoxetine 20 MG tablet  Commonly known as:  PAXIL  Take 20 mg by mouth daily.     predniSONE 10 MG tablet  Commonly known as:  STERAPRED UNI-PAK  Take by mouth daily. Day 1: take 6 tabs.  Day 2: 5 tabs  Day 3: 4 tabs  Day 4: 3 tabs  Day 5: 2 tabs  Day 6: 1 tab       Follow-up Information    Follow up with [name], MD. Schedule an appointment as soon as possible for a visit in 1 week.   Specialty:  Internal Medicine   Contact information:   [address] [place] [address] [phone]        The results of significant diagnostics from this hospitalization (including imaging, microbiology, ancillary and laboratory) are listed below for reference.    Significant Diagnostic Studies: Dg Chest 2 View  06/25/2014   CLINICAL DATA:  Community-acquired pneumonia with persistent shortness of breath and weakness; history of COPD.  EXAM: CHEST  2 VIEW  COMPARISON:  PA and lateral chest x-ray of June 23, 2014.  FINDINGS: The lungs remain hyperinflated with persistently increased interstitial markings diffusely. There is patchy increased density in the left mid upper lung field which is more conspicuous today. The interstitial markings remain  increased bilaterally. There is no pleural effusion or pneumothorax. The cardiac silhouette is normal in size. The pulmonary vascularity is not engorged. The bony thorax is unremarkable.  IMPRESSION: COPD with interstitial pneumonia in the left mid/upper lung which is slightly more conspicuous today. There is no CHF nor pleural effusion.   Electronically Signed   By: David  [place]   On: 06/25/2014 08:14   Dg Chest 2 View  06/23/2014   CLINICAL DATA:  Pneumonia  EXAM: CHEST  2 VIEW  COMPARISON:  06/22/2014  FINDINGS: Mild lobe is opacities, likely atelectasis. No focal consolidation. No pleural effusion or pneumothorax.  The heart is normal in size.  Visualized osseous structures are within normal limits.  IMPRESSION: Mild bibasilar opacities, likely atelectasis.   Electronically Signed  By: Charline BillsSriyesh  Krishnan M.D.   On: 06/23/2014 07:48   Dg Chest 2 View  06/22/2014   CLINICAL DATA:  Cough and congestion. Shortness of breath. Emesis. Generalized chest pain for 1 week. Smoker. [facility]  EXAM: CHEST  2 VIEW  COMPARISON:  06/19/2014  FINDINGS: Normal heart size and pulmonary vascularity. Mild hyperinflation and fat interstitial pattern to the lungs suggesting emphysema and chronic bronchitic changes. No focal airspace disease or consolidation. No blunting of costophrenic angles. No pneumothorax. Mediastinal contours appear intact. Calcification of the aorta.  IMPRESSION: Emphysematous and chronic bronchitic changes in the lungs. No evidence of active pulmonary disease.   Electronically Signed   By: Burman NievesWilliam  Stevens M.D.   On: 06/22/2014 03:38   Dg Chest 2 View  06/19/2014   CLINICAL DATA:  Productive cough.  EXAM: CHEST  2 VIEW  COMPARISON:  04/11/2013  FINDINGS: The heart size and mediastinal contours are within normal limits. Both lungs are clear. No evidence of pleural effusion. No mass or lymphadenopathy identified. The visualized skeletal structures are  unremarkable.  IMPRESSION: No active cardiopulmonary disease.   Electronically Signed   By: Myles RosenthalJohn  Stahl M.D.   On: 06/19/2014 12:45    Microbiology: Recent Results (from the past 240 hour(s))  Urine culture     Status: None   Collection Time: 06/19/14 11:55 AM  Result Value Ref Range Status   Specimen Description URINE, RANDOM  Final   Special Requests NONE  Final   Colony Count NO GROWTH Performed at [facility]   Final   Culture NO GROWTH Performed at [facility]   Final   Report Status 06/20/2014 FINAL  Final  Culture, blood (routine x 2)     Status: None (Preliminary result)   Collection Time: 06/22/14  7:39 AM  Result Value Ref Range Status   Specimen Description BLOOD RIGHT ARM  Final   Special Requests BOTTLES DRAWN AEROBIC AND ANAEROBIC 10CC  Final   Culture   Final           BLOOD CULTURE RECEIVED NO GROWTH TO DATE CULTURE WILL BE HELD FOR 5 DAYS BEFORE ISSUING A FINAL NEGATIVE REPORT Performed at [facility]    Report Status PENDING  Incomplete  Culture, blood (routine x 2)     Status: None (Preliminary result)   Collection Time: 06/22/14  7:48 AM  Result Value Ref Range Status   Specimen Description BLOOD RIGHT ARM  Final   Special Requests BOTTLES DRAWN AEROBIC AND ANAEROBIC 10CC  Final   Culture   Final           BLOOD CULTURE RECEIVED NO GROWTH TO DATE CULTURE WILL BE HELD FOR 5 DAYS BEFORE ISSUING A FINAL NEGATIVE REPORT Performed at [facility]    Report Status PENDING  Incomplete     Labs: Basic Metabolic Panel:  Recent Labs Lab 06/22/14 0342 06/22/14 0504 06/23/14 0426 06/24/14 0311 06/25/14 0618  NA 139 142 141 138 138  K 2.7* 2.4* 3.9 4.0 3.6  CL 97 98 107 108 102  CO2 27  --  25 27 28   GLUCOSE 148* 137* 107* 135* 107*  BUN 6 7 8 13 14   CREATININE 0.76 0.60 0.62 0.63 0.73  CALCIUM 9.3  --  8.5 8.6 8.4   Liver Function Tests:  Recent Labs Lab 06/22/14 0342  AST 29  ALT 23  ALKPHOS 113  BILITOT 0.5   PROT 7.4  ALBUMIN 3.7   No results for input(s): LIPASE, AMYLASE in the last 168 hours. No results for input(s): AMMONIA in the last 168 hours. CBC:  Recent Labs Lab 06/22/14 0342 06/22/14 0504 06/23/14 0426 06/24/14 0311 06/25/14 0618  WBC 14.8*  --  13.1* 14.0* 17.3*  NEUTROABS 13.2*  --   --   --   --   HGB 14.4 16.7* 11.7* 11.5* 13.2  HCT 42.5 49.0* 35.6* 35.5* 40.4  MCV 84.7  --  88.1 86.0 87.8  PLT 346  --  307 327 417*   Cardiac Enzymes: No results for input(s): CKTOTAL, CKMB, CKMBINDEX, TROPONINI in the last 168 hours. BNP: BNP (last 3 results) No results for input(s): PROBNP in the last 8760 hours. CBG: No results for input(s): GLUCAP in the last 168 hours.     Signed:  Marcellus Scott, MD, FACP, FHM. [facility] Pager [phone]  If 7PM-7AM, please contact night-coverage [email or web address] Password [employer] 06/27/2014, 2:47 PM

## 2014-06-28 ENCOUNTER — Other Ambulatory Visit: Payer: Self-pay | Admitting: Internal Medicine

## 2014-06-28 ENCOUNTER — Telehealth: Payer: Self-pay

## 2014-06-28 LAB — CULTURE, BLOOD (ROUTINE X 2)
CULTURE: NO GROWTH
Culture: NO GROWTH

## 2014-06-28 MED ORDER — TRAMADOL HCL 50 MG PO TABS: 50.0000 mg | ORAL_TABLET | Freq: Three times a day (TID) | ORAL | Status: DC | PRN

## 2014-06-28 NOTE — Telephone Encounter (Signed)
Post-discharge Follow-Up Phone Call: Date of Discharge:06/27/14 Principal Discharge Diagnosis: Community-acquired pneumonia, COPD exacerbation   Please check all that apply: X  Patient is knowledgeable of his/her condition(s) and/or treatment. ? Family and/or caregiver is knowledgeable of patient's condition(s) and/or treatment. X  Patient is caring for self at home. ? Patient is receiving home health services. If so, name of agency.    Medication Reconciliation:  X  Medication list reviewed with patient. X  Patient able to obtain needed medications. Patient taking all prescribed medications as directed.    Community resources in place for patient:  X  None  ? Home Health  ? Assisted Living ? Hospice ? Support Group    Topics discussed: Patient taking all medications as prescribed. She indicates she is still coughing and has sputum; however, she "can breathe better" and her sputum is less than before and  thinner in consistency.  She indicates she is having back pain (8/10) from a fall that occurred days before being admitted to the hospital. She indicates ibuprofen is not adequately controlling her pain.  Informed patient that this RN will discuss with Dr. Hyman HopesJegede and get back with her.  Also discussed that follow-up appointment needed with Dr. Orpah CobbAdvani.  Appointment scheduled for 07/09/14 at 1200 with Dr. Orpah CobbAdvani. Patient verbalized understanding.   Addendum 1657-Spoke with Dr. Hyman HopesJegede about patient's pain and Tramadol ordered. Patient called; unable to reach. Left voicemail requesting return call.

## 2014-07-01 ENCOUNTER — Emergency Department (HOSPITAL_COMMUNITY)
Admission: EM | Admit: 2014-07-01 | Discharge: 2014-07-01 | Disposition: A | Discharge status: Home / Self Care | Payer: Self-pay | Attending: Emergency Medicine | Admitting: Emergency Medicine

## 2014-07-01 ENCOUNTER — Emergency Department (HOSPITAL_COMMUNITY): Payer: No Typology Code available for payment source

## 2014-07-01 ENCOUNTER — Other Ambulatory Visit: Payer: Self-pay | Admitting: Internal Medicine

## 2014-07-01 ENCOUNTER — Encounter (HOSPITAL_COMMUNITY): Payer: Self-pay | Admitting: Emergency Medicine

## 2014-07-01 DIAGNOSIS — S4991XA Unspecified injury of right shoulder and upper arm, initial encounter: Secondary | ICD-10-CM | POA: Insufficient documentation

## 2014-07-01 DIAGNOSIS — I10 Essential (primary) hypertension: Secondary | ICD-10-CM | POA: Insufficient documentation

## 2014-07-01 DIAGNOSIS — R0602 Shortness of breath: Secondary | ICD-10-CM

## 2014-07-01 DIAGNOSIS — M545 Low back pain, unspecified: Secondary | ICD-10-CM

## 2014-07-01 DIAGNOSIS — Z720 Tobacco use: Secondary | ICD-10-CM | POA: Insufficient documentation

## 2014-07-01 DIAGNOSIS — Z880 Allergy status to penicillin: Secondary | ICD-10-CM | POA: Insufficient documentation

## 2014-07-01 DIAGNOSIS — S4992XA Unspecified injury of left shoulder and upper arm, initial encounter: Secondary | ICD-10-CM | POA: Insufficient documentation

## 2014-07-01 DIAGNOSIS — F419 Anxiety disorder, unspecified: Secondary | ICD-10-CM | POA: Insufficient documentation

## 2014-07-01 DIAGNOSIS — M1990 Unspecified osteoarthritis, unspecified site: Secondary | ICD-10-CM | POA: Insufficient documentation

## 2014-07-01 DIAGNOSIS — Y9289 Other specified places as the place of occurrence of the external cause: Secondary | ICD-10-CM | POA: Insufficient documentation

## 2014-07-01 DIAGNOSIS — S299XXA Unspecified injury of thorax, initial encounter: Secondary | ICD-10-CM | POA: Insufficient documentation

## 2014-07-01 DIAGNOSIS — W11XXXA Fall on and from ladder, initial encounter: Secondary | ICD-10-CM | POA: Insufficient documentation

## 2014-07-01 DIAGNOSIS — J441 Chronic obstructive pulmonary disease with (acute) exacerbation: Secondary | ICD-10-CM | POA: Insufficient documentation

## 2014-07-01 DIAGNOSIS — Y998 Other external cause status: Secondary | ICD-10-CM | POA: Insufficient documentation

## 2014-07-01 DIAGNOSIS — Y9389 Activity, other specified: Secondary | ICD-10-CM | POA: Insufficient documentation

## 2014-07-01 DIAGNOSIS — Z79899 Other long term (current) drug therapy: Secondary | ICD-10-CM | POA: Insufficient documentation

## 2014-07-01 DIAGNOSIS — Z7952 Long term (current) use of systemic steroids: Secondary | ICD-10-CM | POA: Insufficient documentation

## 2014-07-01 MED ORDER — HYDROCODONE-ACETAMINOPHEN 5-325 MG PO TABS: 1.0000 | ORAL_TABLET | Freq: Four times a day (QID) | ORAL | Status: DC | PRN

## 2014-07-01 MED ORDER — ONDANSETRON HCL 4 MG/2ML IJ SOLN
4.0000 mg | Freq: Once | INTRAMUSCULAR | Status: AC
  Administered 2014-07-01: 4 mg via INTRAVENOUS
  Filled 2014-07-01: qty 2

## 2014-07-01 MED ORDER — IPRATROPIUM-ALBUTEROL 0.5-2.5 (3) MG/3ML IN SOLN
3.0000 mL | Freq: Once | RESPIRATORY_TRACT | Status: AC
  Administered 2014-07-01: 3 mL via RESPIRATORY_TRACT
  Filled 2014-07-01: qty 3

## 2014-07-01 MED ORDER — PREDNISONE 20 MG PO TABS
60.0000 mg | ORAL_TABLET | Freq: Once | ORAL | Status: AC
  Administered 2014-07-01: 60 mg via ORAL
  Filled 2014-07-01: qty 3

## 2014-07-01 MED ORDER — HYDROMORPHONE HCL 1 MG/ML IJ SOLN
0.5000 mg | Freq: Once | INTRAMUSCULAR | Status: AC
  Administered 2014-07-01: 0.5 mg via INTRAVENOUS
  Filled 2014-07-01: qty 1

## 2014-07-01 MED ORDER — PREDNISONE 10 MG PO TABS: 20.0000 mg | ORAL_TABLET | Freq: Two times a day (BID) | ORAL | Status: DC

## 2014-07-01 NOTE — Telephone Encounter (Signed)
Pt stated has F/U appointment with PCP Pt had pain medication prescribed on 06/28/2014  Rx Tramadol

## 2014-07-01 NOTE — ED Notes (Signed)
Pt. Left with all belongings and refused wheelchair 

## 2014-07-01 NOTE — Discharge Instructions (Signed)
Stop the prednisone dose pack you have at home and start the new prescription tomorrow. When you finishnthat go back and finish the dose pack. Call your doctors office tomorrow for a follow up appointment. Return here if symptoms worsen. Take the Tramadol you have for pain and the ibuprofen

## 2014-07-01 NOTE — Telephone Encounter (Signed)
Pt is calling to see if we can give her any medication for her pain. She is experiencing a lot of back and lower back pain. She can barely move and she felt like something popped last night. Please follow up with pt.

## 2014-07-01 NOTE — ED Provider Notes (Signed)
CSN: [id number]     Arrival date & time 07/01/14  2111 History  This chart was scribed for non-physician practitioner, Kerrie BuffaloHope Kayonna Lawniczak, FNP working with Rolland PorterMark James, MD by Greggory StallionKayla Andersen, ED scribe. This patient was seen in room TR08C/TR08C and the patient's care was started at 9:41 PM.   Chief Complaint  Patient presents with  . Back Pain   The history is provided by the patient. No language interpreter was used.    HPI Comments: Maria Oliver is a 54 y.o. female who presents to the Emergency Department complaining of mid to upper back pain and bilateral shoulder pain that worsened yesterday. Certain movements worsen pain. She states that her pain first started after she almost fell from a ladder and twisted her back. After that she started coughing and developed shortness of breath. She was admitted to the hospital with COPD and pneumonia. Pt was released from the hospital 4 days ago. States she is continuing to have SOB and is requesting a breathing treatment in the ED. Pt has been taking the prednisone and tessalon as prescribed that she was discharged with. States she was not discharged with antibiotics. Denies chest pain. No UTI symptoms. Pt states she didn't get her tramadol filled because it doesn't help her. She is requesting something stronger. Patient is an every day smoker.  Past Medical History  Diagnosis Date  . Arthritis   . COPD (chronic obstructive pulmonary disease)   . Hypertension   . Generalized headaches   . Leg swelling   . Abdominal pain   . Anxiety   . Hernia, inguinal, right    Past Surgical History  Procedure Laterality Date  . Hernia repair  2010  . Colon surgery  2010    per patient "colon take down"  . Colostomy  2010  . Tubal ligation     Family History  Problem Relation Age of Onset  . Heart disease Mother 4356  . Heart failure Mother   . Asthma Mother   . Alzheimer's disease Father 867  . Colon cancer Neg Hx    History  Substance Use Topics  . Smoking  status: Current Every Day Smoker -- 1.00 packs/day for 40 years    Types: Cigarettes  . Smokeless tobacco: Never Used  . Alcohol Use: No   OB History    No data available     Review of Systems  Respiratory: Positive for shortness of breath.   Cardiovascular: Negative for chest pain.  Musculoskeletal: Positive for back pain and arthralgias.  All other systems reviewed and are negative.  Allergies  Ciprofloxacin; Aspirin; Codeine; and Penicillins  Home Medications   Prior to Admission medications   Medication Sig Start Date End Date Taking? Authorizing Provider  albuterol (PROVENTIL HFA;VENTOLIN HFA) 108 (90 BASE) MCG/ACT inhaler Inhale 1-2 puffs into the lungs every 6 (six) hours as needed for wheezing or shortness of breath. 06/19/14   Trixie DredgeEmily West, PA-C  benzonatate (TESSALON) 100 MG capsule Take 1 capsule (100 mg total) by mouth every 8 (eight) hours. 06/19/14   Trixie DredgeEmily West, PA-C  hydrochlorothiazide (HYDRODIURIL) 25 MG tablet Take 25 mg by mouth daily.    Historical Provider, MD  HYDROcodone-acetaminophen (NORCO) 5-325 MG per tablet Take 1 tablet by mouth every 6 (six) hours as needed for moderate pain. 07/01/14   Ziggy Reveles Orlene OchM Florian Chauca, NP  ibuprofen (ADVIL,MOTRIN) 600 MG tablet Take 1 tablet (600 mg total) by mouth every 8 (eight) hours as needed for mild pain or moderate pain.  06/19/14   Trixie Dredge, PA-C  PARoxetine (PAXIL) 20 MG tablet Take 20 mg by mouth daily.    Historical Provider, MD  predniSONE (DELTASONE) 10 MG tablet Take 2 tablets (20 mg total) by mouth 2 (two) times daily with a meal. 07/01/14   Maury Groninger Orlene Och, NP   BP 106/74 mmHg  Pulse 62  Temp(Src) 98.4 F (36.9 C) (Oral)  Resp 16  Ht  (1.626 m)  Wt 155 lb (70.308 kg)  BMI 26.59 kg/m2  SpO2 100%   Physical Exam  Constitutional: She is oriented to person, place, and time. She appears well-developed and well-nourished. No distress.  HENT:  Head: Normocephalic and atraumatic.  Eyes: Conjunctivae and EOM are normal.  Neck:  Neck supple.  Cardiovascular: Normal rate and regular rhythm.   Pulmonary/Chest: Effort normal. She has decreased breath sounds.  Decreased breath sounds throughout. Diffuse crackles in left lung field.  Musculoskeletal:       Thoracic back: She exhibits tenderness. Decreased range of motion: due to pain.       Back:  Neurological: She is alert and oriented to person, place, and time.  Skin: Skin is warm and dry.  Psychiatric: She has a normal mood and affect. Her behavior is normal.  Nursing note and vitals reviewed.   ED Course  Procedures (including critical care time) Albuterol/Atrovent neb treatment given which improved the patient's symptoms greatly. Prednisone 60 mg PO given, Dilaudid 0.5 mg IV and Zofran 4 mg IV.  Dr. Fayrene Fearing in to examine the patient and discuss x-ray results and plan of care.   DIAGNOSTIC STUDIES: Oxygen Saturation is 98% on RA, normal by my interpretation.    COORDINATION OF CARE: 9:46 PM-Discussed treatment plan which includes chest xray and breathing treatment with pt at bedside and pt agreed to plan.    MDM  54 y.o. female with back pain that started after a near fall from a ladder over a week ago and worsened after she developed a bad cough. Stable for discharge without pneumonia on x-ray and no respiratory distress. O2 SAAT 100% on R/A at discharge. Will treat for pain and acute excerebration of COPD. She is to call her doctor tomorrow for follow up.  Final diagnoses:  Shortness of breath  Right-sided low back pain without sciatica  COPD exacerbation   I personally performed the services described in this documentation, which was scribed in my presence. The recorded information has been reviewed and is accurate.  [address] Cochran, NP 07/01/14 2351  Rolland Porter, MD 07/06/14 [phone]

## 2014-07-01 NOTE — ED Notes (Signed)
Pt. reports chronic upper back pain , right lateral ribcage pain and bilateral shoulder pain for 2 weeks , admitted last week for COPD , denies SOB , no fever or chills.

## 2014-07-09 ENCOUNTER — Ambulatory Visit: Payer: No Typology Code available for payment source | Attending: Internal Medicine | Admitting: Internal Medicine

## 2014-07-09 ENCOUNTER — Encounter: Payer: Self-pay | Admitting: Internal Medicine

## 2014-07-09 VITALS — BP 120/79 | HR 93 | Temp 98.0°F | Resp 16 | Wt 142.8 lb

## 2014-07-09 DIAGNOSIS — M5489 Other dorsalgia: Secondary | ICD-10-CM | POA: Insufficient documentation

## 2014-07-09 DIAGNOSIS — M549 Dorsalgia, unspecified: Secondary | ICD-10-CM

## 2014-07-09 DIAGNOSIS — Z720 Tobacco use: Secondary | ICD-10-CM

## 2014-07-09 DIAGNOSIS — J189 Pneumonia, unspecified organism: Secondary | ICD-10-CM

## 2014-07-09 DIAGNOSIS — F17200 Nicotine dependence, unspecified, uncomplicated: Secondary | ICD-10-CM

## 2014-07-09 DIAGNOSIS — B351 Tinea unguium: Secondary | ICD-10-CM | POA: Insufficient documentation

## 2014-07-09 DIAGNOSIS — M546 Pain in thoracic spine: Secondary | ICD-10-CM

## 2014-07-09 DIAGNOSIS — J449 Chronic obstructive pulmonary disease, unspecified: Secondary | ICD-10-CM | POA: Insufficient documentation

## 2014-07-09 DIAGNOSIS — J188 Other pneumonia, unspecified organism: Secondary | ICD-10-CM | POA: Insufficient documentation

## 2014-07-09 DIAGNOSIS — I10 Essential (primary) hypertension: Secondary | ICD-10-CM | POA: Insufficient documentation

## 2014-07-09 LAB — COMPLETE METABOLIC PANEL WITH GFR
ALK PHOS: 69 U/L (ref 39–117)
ALT: 14 U/L (ref 0–35)
AST: 13 U/L (ref 0–37)
Albumin: 4.3 g/dL (ref 3.5–5.2)
BUN: 10 mg/dL (ref 6–23)
CHLORIDE: 107 meq/L (ref 96–112)
CO2: 26 mEq/L (ref 19–32)
Calcium: 10 mg/dL (ref 8.4–10.5)
Creat: 0.59 mg/dL (ref 0.50–1.10)
Glucose, Bld: 85 mg/dL (ref 70–99)
Potassium: 5.3 mEq/L (ref 3.5–5.3)
Sodium: 143 mEq/L (ref 135–145)
Total Bilirubin: 0.4 mg/dL (ref 0.2–1.2)
Total Protein: 6.5 g/dL (ref 6.0–8.3)

## 2014-07-09 MED ORDER — TRAMADOL HCL 50 MG PO TABS: 50.0000 mg | ORAL_TABLET | Freq: Three times a day (TID) | ORAL | Status: DC | PRN

## 2014-07-09 MED ORDER — CYCLOBENZAPRINE HCL 10 MG PO TABS: 10.0000 mg | ORAL_TABLET | Freq: Every day | ORAL | Status: DC

## 2014-07-09 MED ORDER — AZITHROMYCIN 250 MG PO TABS: ORAL_TABLET | ORAL | Status: DC

## 2014-07-09 NOTE — Progress Notes (Signed)
Patient states here for follow up from the hospital  Was admitted with pneumonia  Patient still complains of having upper back pain Requesting an antibiotic Patient states she stopped taking the prednisone because  It is affecting her eye

## 2014-07-09 NOTE — Progress Notes (Signed)
MRN: [id number] Name: Maria Oliver  Sex: female Age: 54 y.o. DOB: 1960-11-17  Allergies: Ciprofloxacin; Aspirin; Codeine; and Penicillins  Chief Complaint  Patient presents with  . Follow-up    HPI: Patient is 54 y.o. female who  has history of hypertension, COPD, tobacco abuse, 2 weeks ago patient was hospitalized with symptoms of cough shortness of breath, EMR reviewed patient was treated for community-acquired pneumonia as well as COPD exacerbation, was discharged on steroids, one week ago patient went to the emergency room with symptoms of upper back pain, EMR reviewed she had a repeat chest x-ray done which was negative for pneumonia, patient has already been treated with antibiotic she still has occasional cough and wheezing denies any shortness of breath or fever or chills, patient is trying to quit smoking, patient was advised to make an appointment with pulmonologist, for hypertension she has been taking hydrochlorothiazide.  Past Medical History  Diagnosis Date  . Arthritis   . COPD (chronic obstructive pulmonary disease)   . Hypertension   . Generalized headaches   . Leg swelling   . Abdominal pain   . Anxiety   . Hernia, inguinal, right     Past Surgical History  Procedure Laterality Date  . Hernia repair  2010  . Colon surgery  2010    per patient "colon take down"  . Colostomy  2010  . Tubal ligation        Medication List       This list is accurate as of: 07/09/14 12:46 PM.  Always use your most recent med list.               albuterol 108 (90 BASE) MCG/ACT inhaler  Commonly known as:  PROVENTIL HFA;VENTOLIN HFA  Inhale 1-2 puffs into the lungs every 6 (six) hours as needed for wheezing or shortness of breath.     benzonatate 100 MG capsule  Commonly known as:  TESSALON  Take 1 capsule (100 mg total) by mouth every 8 (eight) hours.     cyclobenzaprine 10 MG tablet  Commonly known as:  FLEXERIL  Take 1 tablet (10 mg total) by mouth at  bedtime.     hydrochlorothiazide 25 MG tablet  Commonly known as:  HYDRODIURIL  Take 25 mg by mouth daily.     HYDROcodone-acetaminophen 5-325 MG per tablet  Commonly known as:  NORCO  Take 1 tablet by mouth every 6 (six) hours as needed for moderate pain.     ibuprofen 600 MG tablet  Commonly known as:  ADVIL,MOTRIN  Take 1 tablet (600 mg total) by mouth every 8 (eight) hours as needed for mild pain or moderate pain.     PARoxetine 20 MG tablet  Commonly known as:  PAXIL  Take 20 mg by mouth daily.     predniSONE 10 MG tablet  Commonly known as:  DELTASONE  Take 2 tablets (20 mg total) by mouth 2 (two) times daily with a meal.        Meds ordered this encounter  Medications  . cyclobenzaprine (FLEXERIL) 10 MG tablet    Sig: Take 1 tablet (10 mg total) by mouth at bedtime.    Dispense:  30 tablet    Refill:  0    Immunization History  Administered Date(s) Administered  . Tdap 10/07/2013    Family History  Problem Relation Age of Onset  . Heart disease Mother 5056  . Heart failure Mother   . Asthma Mother   .  Alzheimer's disease Father 16  . Colon cancer Neg Hx     History  Substance Use Topics  . Smoking status: Current Every Day Smoker -- 1.00 packs/day for 40 years    Types: Cigarettes  . Smokeless tobacco: Never Used  . Alcohol Use: No    Review of Systems   As noted in HPI  Filed Vitals:   07/09/14 1210  BP: 120/79  Pulse: 93  Temp: 98 F (36.7 C)  Resp: 16    Physical Exam  Physical Exam  Eyes: EOM are normal. Pupils are equal, round, and reactive to light.  Cardiovascular: Normal rate and regular rhythm.   Pulmonary/Chest: Breath sounds normal. No respiratory distress. She has no wheezes. She has no rales.  Musculoskeletal:  Upper back paraspinal tenderness  Onychomycosis in toes     CBC    Component Value Date/Time   WBC 17.3* 06/25/2014 0618   RBC 4.60 06/25/2014 0618   HGB 13.2 06/25/2014 0618   HCT 40.4 06/25/2014 0618    PLT 417* 06/25/2014 0618   MCV 87.8 06/25/2014 0618   LYMPHSABS 0.8 06/22/2014 0342   MONOABS 0.8 06/22/2014 0342   EOSABS 0.0 06/22/2014 0342   BASOSABS 0.0 06/22/2014 0342    CMP     Component Value Date/Time   NA 138 06/25/2014 0618   K 3.6 06/25/2014 0618   CL 102 06/25/2014 0618   CO2 28 06/25/2014 0618   GLUCOSE 107* 06/25/2014 0618   BUN 14 06/25/2014 0618   CREATININE 0.73 06/25/2014 0618   CREATININE 0.71 12/19/2013 1656   CALCIUM 8.4 06/25/2014 0618   PROT 7.4 06/22/2014 0342   ALBUMIN 3.7 06/22/2014 0342   AST 29 06/22/2014 0342   ALT 23 06/22/2014 0342   ALKPHOS 113 06/22/2014 0342   BILITOT 0.5 06/22/2014 0342   GFRNONAA >90 06/25/2014 0618   GFRNONAA >89 12/19/2013 1656   GFRAA >90 06/25/2014 0618   GFRAA >89 12/19/2013 1656    Lab Results  Component Value Date/Time   CHOL 221* 05/09/2013 02:45 PM    No components found for: HGA1C  Lab Results  Component Value Date/Time   AST 29 06/22/2014 03:42 AM    Assessment and Plan  Essential hypertension, benign - Plan:blood pressure is well controlled, continue with hydrochlorothiazide, repeat  COMPLETE METABOLIC PANEL WITH GFR  CAP (community acquired pneumonia) Patient has been treated with antibiotic, repeat chest x-ray was negative for pneumonia  SMOKER Consultation to quit smoking.  Chronic obstructive pulmonary disease, unspecified COPD, unspecified chronic bronchitis type - Plan:Currently patient is on albuterol when necessary, needs to have PFTs done  Ambulatory referral to Pulmonology  Upper back pain - Plan: cyclobenzaprine (FLEXERIL) 10 MG tablet, tramadol when necessary for pain  Onychomycosis - Plan:patient still has persistent symptoms already been treated  With Lamisil. Ambulatory referral to Podiatry   Return in about 3 months (around 10/08/2014) for hypertension.  [name], MD

## 2014-07-12 ENCOUNTER — Encounter (INDEPENDENT_AMBULATORY_CARE_PROVIDER_SITE_OTHER): Payer: Self-pay

## 2014-07-12 ENCOUNTER — Ambulatory Visit (INDEPENDENT_AMBULATORY_CARE_PROVIDER_SITE_OTHER): Payer: Self-pay | Admitting: Pulmonary Disease

## 2014-07-12 ENCOUNTER — Encounter: Payer: Self-pay | Admitting: Pulmonary Disease

## 2014-07-12 DIAGNOSIS — R0609 Other forms of dyspnea: Secondary | ICD-10-CM

## 2014-07-12 NOTE — Progress Notes (Signed)
   Subjective:    Patient ID: Maria Oliver, female    DOB: [birth year], 54 y.o.   MRN: [id number]  HPI The patient is a 54 year old female who I've been asked to see for possible COPD. She has a long history of smoking, and continues to do so. She was recently in the hospital with possible pneumonia, although a review of her chest x-ray looks more like atelectasis than infiltrate. The patient feels much better since discharge, but continues to have significant dyspnea on exertion. She tells me that she has been having breathing issues for 1-2 years, and they are getting worse. Currently she describes a less than one block dyspnea on exertion at a moderate pace on flat ground, and will also get winded walking up one flight of stairs. She also gets short of breath doing her activities of daily living. She has an intermittent cough with white foamy mucus, and also gives a history for recurrent episodes of bronchitis. She also notes intermittent ankle edema at times. She has had a recent chest x-ray which showed some hyperinflation and increased bronchovascular markings, but no acute process.   Review of Systems  Constitutional: Negative for fever and unexpected weight change.  HENT: Positive for congestion, dental problem, postnasal drip and trouble swallowing. Negative for ear pain, nosebleeds, rhinorrhea, sinus pressure, sneezing and sore throat.   Eyes: Negative for redness and itching.  Respiratory: Positive for cough, chest tightness, shortness of breath and wheezing.   Cardiovascular: Positive for chest pain and leg swelling. Negative for palpitations.  Gastrointestinal: Positive for abdominal pain. Negative for nausea and vomiting.  Genitourinary: Negative for dysuria.  Musculoskeletal: Positive for arthralgias. Negative for joint swelling.  Skin: Negative for rash.  Neurological: Negative for headaches.  Hematological: Does not bruise/bleed easily.  Psychiatric/Behavioral: Positive for  dysphoric mood. The patient is nervous/anxious.        Objective:   Physical Exam Constitutional:  Well developed, no acute distress  HENT:  Nares patent without discharge  Oropharynx without exudate, palate and uvula are normal  Eyes:  Perrla, eomi, no scleral icterus  Neck:  No JVD, no TMG  Cardiovascular:  Normal rate, regular rhythm, no rubs or gallops.  No murmurs        Intact distal pulses  Pulmonary :  Normal breath sounds, no stridor or respiratory distress   No rales, rhonchi, or wheezing  Abdominal:  Soft, nondistended, bowel sounds present.  No tenderness noted.   Musculoskeletal:  No significant lower extremity edema noted.  Lymph Nodes:  No cervical lymphadenopathy noted  Skin:  No cyanosis noted  Neurologic:  Alert, appropriate, moves all 4 extremities without obvious deficit.         Assessment & Plan:

## 2014-07-12 NOTE — Assessment & Plan Note (Signed)
The patient has significant dyspnea on exertion by history, but it is unclear whether she has clinically significant airflow obstruction or not. She does have a long history of smoking, but has not had pulmonary function studies. At this point, she needs to have full PFTs, and I have also had a long discussion with her about the importance of total smoking cessation. If she is indeed found to have COPD, I will start her on an aggressive bronchodilator regimen and also refer to pulmonary rehabilitation. I will see her back on the same day as her breathing studies so that we can review.

## 2014-07-12 NOTE — Patient Instructions (Signed)
Will schedule for breathing studies, and see you back the same day to review You need to really work on total smoking cessation. Can use albuterol as needed until we see you back for review of your breathing tests.

## 2014-07-26 ENCOUNTER — Emergency Department (HOSPITAL_COMMUNITY): Payer: Self-pay

## 2014-07-26 ENCOUNTER — Emergency Department (HOSPITAL_COMMUNITY)
Admission: EM | Admit: 2014-07-26 | Discharge: 2014-07-27 | Disposition: A | Discharge status: Home / Self Care | Payer: Self-pay | Attending: Emergency Medicine | Admitting: Emergency Medicine

## 2014-07-26 ENCOUNTER — Encounter (HOSPITAL_COMMUNITY): Payer: Self-pay | Admitting: *Deleted

## 2014-07-26 DIAGNOSIS — M25512 Pain in left shoulder: Secondary | ICD-10-CM | POA: Insufficient documentation

## 2014-07-26 DIAGNOSIS — R109 Unspecified abdominal pain: Secondary | ICD-10-CM

## 2014-07-26 DIAGNOSIS — R1084 Generalized abdominal pain: Secondary | ICD-10-CM | POA: Insufficient documentation

## 2014-07-26 DIAGNOSIS — F419 Anxiety disorder, unspecified: Secondary | ICD-10-CM | POA: Insufficient documentation

## 2014-07-26 DIAGNOSIS — Z720 Tobacco use: Secondary | ICD-10-CM | POA: Insufficient documentation

## 2014-07-26 DIAGNOSIS — R079 Chest pain, unspecified: Secondary | ICD-10-CM | POA: Insufficient documentation

## 2014-07-26 DIAGNOSIS — Z79899 Other long term (current) drug therapy: Secondary | ICD-10-CM | POA: Insufficient documentation

## 2014-07-26 DIAGNOSIS — Z7952 Long term (current) use of systemic steroids: Secondary | ICD-10-CM | POA: Insufficient documentation

## 2014-07-26 DIAGNOSIS — Z880 Allergy status to penicillin: Secondary | ICD-10-CM | POA: Insufficient documentation

## 2014-07-26 DIAGNOSIS — K5900 Constipation, unspecified: Secondary | ICD-10-CM | POA: Insufficient documentation

## 2014-07-26 DIAGNOSIS — M1990 Unspecified osteoarthritis, unspecified site: Secondary | ICD-10-CM | POA: Insufficient documentation

## 2014-07-26 DIAGNOSIS — I10 Essential (primary) hypertension: Secondary | ICD-10-CM | POA: Insufficient documentation

## 2014-07-26 DIAGNOSIS — J449 Chronic obstructive pulmonary disease, unspecified: Secondary | ICD-10-CM | POA: Insufficient documentation

## 2014-07-26 LAB — COMPREHENSIVE METABOLIC PANEL
ALK PHOS: 65 U/L (ref 39–117)
ALT: 12 U/L (ref 0–35)
AST: 17 U/L (ref 0–37)
Albumin: 3.7 g/dL (ref 3.5–5.2)
Anion gap: 4 — ABNORMAL LOW (ref 5–15)
BILIRUBIN TOTAL: 0.4 mg/dL (ref 0.3–1.2)
BUN: 9 mg/dL (ref 6–23)
CO2: 31 mmol/L (ref 19–32)
Calcium: 9.3 mg/dL (ref 8.4–10.5)
Chloride: 106 mmol/L (ref 96–112)
Creatinine, Ser: 0.65 mg/dL (ref 0.50–1.10)
GLUCOSE: 114 mg/dL — AB (ref 70–99)
Potassium: 3.5 mmol/L (ref 3.5–5.1)
SODIUM: 141 mmol/L (ref 135–145)
Total Protein: 6.4 g/dL (ref 6.0–8.3)

## 2014-07-26 LAB — CBC WITH DIFFERENTIAL/PLATELET
BASOS PCT: 0 % (ref 0–1)
Basophils Absolute: 0 10*3/uL (ref 0.0–0.1)
Eosinophils Absolute: 0.2 10*3/uL (ref 0.0–0.7)
Eosinophils Relative: 2 % (ref 0–5)
HEMATOCRIT: 41.7 % (ref 36.0–46.0)
HEMOGLOBIN: 14 g/dL (ref 12.0–15.0)
Lymphocytes Relative: 32 % (ref 12–46)
Lymphs Abs: 2.9 10*3/uL (ref 0.7–4.0)
MCH: 28.5 pg (ref 26.0–34.0)
MCHC: 33.6 g/dL (ref 30.0–36.0)
MCV: 84.9 fL (ref 78.0–100.0)
MONO ABS: 0.6 10*3/uL (ref 0.1–1.0)
Monocytes Relative: 7 % (ref 3–12)
Neutro Abs: 5.3 10*3/uL (ref 1.7–7.7)
Neutrophils Relative %: 59 % (ref 43–77)
Platelets: 328 10*3/uL (ref 150–400)
RBC: 4.91 MIL/uL (ref 3.87–5.11)
RDW: 14.9 % (ref 11.5–15.5)
WBC: 9 10*3/uL (ref 4.0–10.5)

## 2014-07-26 LAB — URINALYSIS, ROUTINE W REFLEX MICROSCOPIC
Glucose, UA: NEGATIVE mg/dL
HGB URINE DIPSTICK: NEGATIVE
Ketones, ur: NEGATIVE mg/dL
Leukocytes, UA: NEGATIVE
Nitrite: NEGATIVE
PROTEIN: NEGATIVE mg/dL
SPECIFIC GRAVITY, URINE: 1.029 (ref 1.005–1.030)
Urobilinogen, UA: 0.2 mg/dL (ref 0.0–1.0)
pH: 5.5 (ref 5.0–8.0)

## 2014-07-26 LAB — TROPONIN I: Troponin I: <0.03 ng/mL (ref <0.031)

## 2014-07-26 LAB — LIPASE, BLOOD: LIPASE: 21 U/L (ref 11–59)

## 2014-07-26 MED ORDER — IOHEXOL 300 MG/ML  SOLN
100.0000 mL | Freq: Once | INTRAMUSCULAR | Status: AC | PRN
  Administered 2014-07-26: 100 mL via INTRAVENOUS

## 2014-07-26 MED ORDER — MORPHINE SULFATE 4 MG/ML IJ SOLN
4.0000 mg | Freq: Once | INTRAMUSCULAR | Status: AC
Start: 2014-07-26 — End: 2014-07-26
  Administered 2014-07-26: 4 mg via INTRAVENOUS
  Filled 2014-07-26: qty 1

## 2014-07-26 MED ORDER — IOHEXOL 300 MG/ML  SOLN
25.0000 mL | Freq: Once | INTRAMUSCULAR | Status: AC | PRN
  Administered 2014-07-26: 25 mL via ORAL

## 2014-07-26 MED ORDER — PROMETHAZINE HCL 25 MG/ML IJ SOLN
25.0000 mg | Freq: Once | INTRAMUSCULAR | Status: AC | PRN
  Filled 2014-07-26: qty 1

## 2014-07-26 MED ORDER — ONDANSETRON 4 MG PO TBDP: 4.0000 mg | ORAL_TABLET | Freq: Three times a day (TID) | ORAL | Status: DC | PRN

## 2014-07-26 MED ORDER — ONDANSETRON HCL 4 MG/2ML IJ SOLN
4.0000 mg | Freq: Once | INTRAMUSCULAR | Status: AC
  Administered 2014-07-26: 4 mg via INTRAVENOUS
  Filled 2014-07-26: qty 2

## 2014-07-26 NOTE — ED Provider Notes (Signed)
CSN: [id number]     Arrival date & time 07/26/14  1739 History   First MD Initiated Contact with Patient 07/26/14 2005     Chief Complaint  Patient presents with  . Abdominal Pain     (Consider location/radiation/quality/duration/timing/severity/associated sxs/prior Treatment) HPI Comments: Patient is a 54 year old female past medical history significant for COPD, HTN, anxiety, hernia presenting to the emergency department for 3 days of worsening generalized abdominal pain with radiation into her groin and back. She is unable to qualify her pain but does endorse associated constipation x 2 weeks , states she had a small bowel movement earlier today and has been able to pass gas. She's tried ibuprofen with no improvement. Denies any nausea, vomiting, urinary symptoms. Patient is also complaining about left sided chest pain and shoulder pain. She describes as a sore muscle no improvement. No modifying factors identified. She has had a stress test greater than 5 years ago no history of echocardiogram or cardiac catheterization.  Patient is a 54 y.o. female presenting with abdominal pain.  Abdominal Pain Associated symptoms: chest pain and constipation   Associated symptoms: no chills, no diarrhea, no fever, no nausea and no vomiting     Past Medical History  Diagnosis Date  . Arthritis   . COPD (chronic obstructive pulmonary disease)   . Hypertension   . Generalized headaches   . Leg swelling   . Abdominal pain   . Anxiety   . Hernia, inguinal, right    Past Surgical History  Procedure Laterality Date  . Hernia repair  2010  . Colon surgery  2010    per patient "colon take down"  . Colostomy  2010  . Tubal ligation     Family History  Problem Relation Age of Onset  . Heart disease Mother 11  . Heart failure Mother   . Asthma Mother   . Alzheimer's disease Father 58  . Colon cancer Neg Hx   . Emphysema Mother   . Emphysema Brother   . Asthma Brother    History  Substance  Use Topics  . Smoking status: Current Every Day Smoker -- 0.50 packs/day for 40 years    Types: Cigarettes  . Smokeless tobacco: Never Used  . Alcohol Use: No   OB History    No data available     Review of Systems  Constitutional: Negative for fever and chills.  Cardiovascular: Positive for chest pain.  Gastrointestinal: Positive for abdominal pain and constipation. Negative for nausea, vomiting, diarrhea and blood in stool.  Musculoskeletal: Positive for arthralgias.  All other systems reviewed and are negative.     Allergies  Aspirin; Ciprofloxacin; Codeine; and Penicillins  Home Medications   Prior to Admission medications   Medication Sig Start Date End Date Taking? Authorizing Provider  albuterol (PROVENTIL HFA;VENTOLIN HFA) 108 (90 BASE) MCG/ACT inhaler Inhale 1-2 puffs into the lungs every 6 (six) hours as needed for wheezing or shortness of breath. 06/19/14  Yes Trixie Dredge, PA-C  cyclobenzaprine (FLEXERIL) 10 MG tablet Take 1 tablet (10 mg total) by mouth at bedtime. 07/09/14  Yes [name], MD  hydrochlorothiazide (HYDRODIURIL) 25 MG tablet Take 25 mg by mouth daily.   Yes Historical Provider, MD  ibuprofen (ADVIL,MOTRIN) 200 MG tablet Take 400 mg by mouth every 6 (six) hours as needed for moderate pain.   Yes Historical Provider, MD  PARoxetine (PAXIL) 20 MG tablet Take 20 mg by mouth daily.   Yes Historical Provider, MD  predniSONE (DELTASONE) 10  MG tablet Take 2 tablets (20 mg total) by mouth 2 (two) times daily with a meal. Patient taking differently: Take 10 mg by mouth daily with breakfast.  07/01/14  Yes Hope Orlene Och, NP  Sennosides (LAXATIVE MAX STR PO) Take 4 tablets by mouth as needed (for constipation).   Yes Historical Provider, MD  traMADol (ULTRAM) 50 MG tablet Take 1 tablet (50 mg total) by mouth every 8 (eight) hours as needed. 07/09/14  Yes Quentin Angst, MD  azithromycin (ZITHROMAX Z-PAK) 250 MG tablet Take as directed 07/09/14   [name], MD    benzonatate (TESSALON) 100 MG capsule Take 1 capsule (100 mg total) by mouth every 8 (eight) hours. 06/19/14   Trixie Dredge, PA-C  HYDROcodone-acetaminophen (NORCO) 5-325 MG per tablet Take 1 tablet by mouth every 6 (six) hours as needed for moderate pain. 07/01/14   Hope Orlene Och, NP  ibuprofen (ADVIL,MOTRIN) 600 MG tablet Take 1 tablet (600 mg total) by mouth every 8 (eight) hours as needed for mild pain or moderate pain. 06/19/14   Trixie Dredge, PA-C  ondansetron (ZOFRAN ODT) 4 MG disintegrating tablet Take 1 tablet (4 mg total) by mouth every 8 (eight) hours as needed for nausea or vomiting. 07/27/14   Melvenia Beam A Upstill, PA-C  polyethylene glycol (MIRALAX) packet Take 17 g by mouth 2 (two) times daily. 07/27/14   Shari A Upstill, PA-C   BP 123/80 mmHg  Pulse 77  Temp(Src) 98.5 F (36.9 C) (Oral)  Resp 19  SpO2 92% Physical Exam  Constitutional: She is oriented to person, place, and time. She appears well-developed and well-nourished.  HENT:  Head: Normocephalic and atraumatic.  Eyes: Conjunctivae are normal.  Neck: Neck supple.  Cardiovascular: Normal rate, regular rhythm and normal heart sounds.   Pulmonary/Chest: Effort normal and breath sounds normal. No respiratory distress. She exhibits no tenderness and no bony tenderness.  Abdominal: Soft. Bowel sounds are normal. She exhibits no distension. There is generalized tenderness. There is no rebound and no guarding.    Musculoskeletal: Normal range of motion.       Left shoulder: She exhibits tenderness. She exhibits normal range of motion, no bony tenderness, no deformity, no pain and no spasm.  Neurological: She is alert and oriented to person, place, and time.  Skin: Skin is warm and dry.  Nursing note and vitals reviewed.   ED Course  Procedures (including critical care time) Medications  promethazine (PHENERGAN) injection 25 mg (25 mg Intravenous Not Given 07/27/14 0018)  morphine 4 MG/ML injection 4 mg (4 mg Intravenous Given 07/26/14  2047)  ondansetron (ZOFRAN) injection 4 mg (4 mg Intravenous Given 07/26/14 2043)  iohexol (OMNIPAQUE) 300 MG/ML solution 25 mL (25 mLs Oral Contrast Given 07/26/14 2050)  iohexol (OMNIPAQUE) 300 MG/ML solution 100 mL (100 mLs Intravenous Contrast Given 07/26/14 2210)    Labs Review Labs Reviewed  COMPREHENSIVE METABOLIC PANEL - Abnormal; Notable for the following:    Glucose, Bld 114 (*)    Anion gap 4 (*)    All other components within normal limits  URINALYSIS, ROUTINE W REFLEX MICROSCOPIC - Abnormal; Notable for the following:    APPearance CLOUDY (*)    Bilirubin Urine SMALL (*)    All other components within normal limits  CBC WITH DIFFERENTIAL/PLATELET  LIPASE, BLOOD  TROPONIN I    Imaging Review Ct Abdomen Pelvis W Contrast  07/26/2014   CLINICAL DATA:  Subacute onset of generalized abdominal pain and constipation. Vomiting. Initial encounter.  EXAM:  CT ABDOMEN AND PELVIS WITH CONTRAST  TECHNIQUE: Multidetector CT imaging of the abdomen and pelvis was performed using the standard protocol following bolus administration of intravenous contrast.  CONTRAST:  OMNIPAQUE IOHEXOL 300 MG/ML  SOLN  COMPARISON:  CT of the abdomen and pelvis from 09/22/2011  FINDINGS: Mild bibasilar atelectasis is noted.  The liver is unremarkable in appearance. A few scattered hypodensities are seen within the spleen, similar in appearance to 2013 and likely benign. The gallbladder is within normal limits. The pancreas and adrenal glands are unremarkable.  A 1.1 cm cyst is noted at the interpole region of the left kidney, with mild overlying scarring. Mild nonspecific left-sided perinephric stranding is seen. There is no evidence of hydronephrosis. No renal or ureteral stones are seen.  No free fluid is identified. The proximal small bowel is unremarkable in appearance. The stomach is within normal limits. No acute vascular abnormalities are seen. Mild scattered calcification is seen along the abdominal aorta  and its branches.  A large anterior abdominal wall hernia is again noted at the right lower abdomen, containing multiple loops of small bowel. There is no evidence for obstruction or strangulation at this time. No significant soft tissue inflammation is seen.  The appendix is not definitely seen; there is no evidence of appendicitis. The colon is mostly empty and unremarkable in appearance.  The bladder is decompressed and not well assessed. The uterus is grossly unremarkable. The ovaries are relatively symmetric. No suspicious adnexal masses are seen. No inguinal lymphadenopathy is seen.  No acute osseous abnormalities are identified.  IMPRESSION: 1. No acute abnormality seen to explain the patient's symptoms. 2. The colon is largely empty and grossly unremarkable in appearance. No definite evidence for constipation. 3. Mild scattered calcification along the abdominal aorta and its branches. 4. Large anterior abdominal wall hernia noted at the right lower abdomen, containing multiple loops of small bowel. No evidence for obstruction or strangulation. The appearance is relatively stable from 2013. 5. Stable scattered hypodensities within the spleen are likely benign. Small left renal cyst noted, with mild overlying scarring. 6. Mild bibasilar atelectasis noted.   Electronically Signed   By: Roanna Raider M.D.   On: 07/26/2014 22:46   Dg Abd Acute W/chest  07/26/2014   CLINICAL DATA:  Diffuse abdominal pain. Nausea and vomiting. Back pain. COPD.  EXAM: ACUTE ABDOMEN SERIES (ABDOMEN 2 VIEW & CHEST 1 VIEW)  COMPARISON:  Chest radiograph on 07/01/2014  FINDINGS: Mildly dilated small bowel loops are seen in the lower abdomen and pelvis. Bowel gas all seen within colon which is nondilated. This is nonspecific and may be due to a focal ileus or partial/early small bowel obstruction. No evidence of free air.  Minimal atelectasis or scarring noted at left lung base. Lung fields are otherwise clear. Heart size is normal.   IMPRESSION: Mildly dilated small bowel loops and lower abdomen and pelvis. This is nonspecific, but may be due to focal ileus or partial/ early small bowel obstruction.  Minimal left basilar atelectasis versus scarring.   Electronically Signed   By: Myles Rosenthal M.D.   On: 07/26/2014 21:34     EKG Interpretation None      MDM   Final diagnoses:  Abdominal pain  Abdominal pain    Filed Vitals:   07/27/14 0025  BP:   Pulse:   Temp: 98.5 F (36.9 C)  Resp:    Afebrile, NAD, non-toxic appearing, AAOx4.  I have reviewed nursing notes, vital signs, and all  appropriate lab and imaging results for this patient. Patient is nontoxic, nonseptic appearing, in no apparent distress.  Patient's pain and other symptoms adequately managed in emergency department.  Fluid bolus given.  Labs, imaging and vitals reviewed.  Patient does not meet the SIRS or Sepsis criteria.  On repeat exam patient does not have a surgical abdomen and there are no peritoneal signs.  No indication of appendicitis, bowel obstruction, bowel perforation, cholecystitis, diverticulitis, PID or ectopic pregnancy or cardiac etiology.  Patient discharged home with symptomatic treatment and given strict instructions for follow-up with their primary care physician.  I have also discussed reasons to return immediately to the ER.  Patient expresses understanding and agrees with plan. Patient d/w with Dr. Radford PaxBeaton, agrees with plan.    Signed out to Elpidio AnisShari Upstill, PA-C pending fluid challenge.      Jeannetta EllisJennifer L Devaunte Gasparini, PA-C 07/27/14 [id number]  Nelia Shiobert L Beaton, MD 07/27/14 [phone]

## 2014-07-26 NOTE — ED Notes (Signed)
Pt c/o abd pain for 2 weeks with constipation.  She also has a visible hernia rt lower abd that is  Large.

## 2014-07-26 NOTE — ED Notes (Signed)
Pt denies nausea at this time .  Pt given water as fluid challenge

## 2014-07-26 NOTE — ED Notes (Signed)
Pt vomited after ingesting contrast.  Pt denies nausea at this time.

## 2014-07-27 MED ORDER — ONDANSETRON 4 MG PO TBDP: 4.0000 mg | ORAL_TABLET | Freq: Three times a day (TID) | ORAL | Status: DC | PRN

## 2014-07-27 MED ORDER — POLYETHYLENE GLYCOL 3350 17 G PO PACK: 17.0000 g | PACK | Freq: Two times a day (BID) | ORAL | Status: DC

## 2014-07-27 NOTE — ED Provider Notes (Signed)
[facility] at end of [facility].  Re-eval:  Patient presented with generalized abdominal pain, constipation with nausea/vomiting only after receiving CM for CT scan in the department. She has a history of multiple surgeries in the past and a concerning plain film of abdomen so CT scan performed to r/o obstruction - which is negative. Re-eval: patient is tolerating PO fluids. Discussed treatment of constipation based on presentation, however, /noting/discussing that no significant constipation was seen on CT. [facility] and will follow up after this holiday weekend next week on Tuesday. Return precautions provided/discussed with the patient. VSS. She is felt stable for discharge home.   Arnoldo HookerShari A Collin Rengel, PA-C 07/27/14 0010  Nelia Shiobert L Beaton, MD 07/27/14 [phone]

## 2014-07-27 NOTE — Discharge Instructions (Signed)
Please follow up with your primary care physician in 1-2 days. If you do not have one please call the [facility] number listed above. Please read all discharge instructions and return precautions.  ° °Abdominal Pain °Many things can cause abdominal pain. Usually, abdominal pain is not caused by a disease and will improve without treatment. It can often be observed and treated at home. Your health care provider will do a physical exam and possibly order blood tests and X-rays to help determine the seriousness of your pain. However, in many cases, more time must pass before a clear cause of the pain can be found. Before that point, your health care provider may not know if you need more testing or further treatment. °HOME CARE INSTRUCTIONS  °Monitor your abdominal pain for any changes. The following actions may help to alleviate any discomfort you are experiencing: °· Only take over-the-counter or prescription medicines as directed by your health care provider. °· Do not take laxatives unless directed to do so by your health care provider. °· Try a clear liquid diet (broth, tea, or water) as directed by your health care provider. Slowly move to a bland diet as tolerated. °SEEK MEDICAL CARE IF: °· You have unexplained abdominal pain. °· You have abdominal pain associated with nausea or diarrhea. °· You have pain when you urinate or have a bowel movement. °· You experience abdominal pain that wakes you in the night. °· You have abdominal pain that is worsened or improved by eating food. °· You have abdominal pain that is worsened with eating fatty foods. °· You have a fever. °SEEK IMMEDIATE MEDICAL CARE IF:  °· Your pain does not go away within 2 hours. °· You keep throwing up (vomiting). °· Your pain is felt only in portions of the abdomen, such as the right side or the left lower portion of the abdomen. °· You pass bloody or black tarry stools. °MAKE SURE YOU: °· Understand these instructions.    °· Will watch your condition.   °· Will get help right away if you are not doing well or get worse.   °Document Released: 03/10/2005 Document Revised: 06/05/2013 Document Reviewed: 02/07/2013 °Exit[employer]® Patient Information ©2015 [employer]. This information is not intended to replace advice given to you by your health care provider. Make sure you discuss any questions you have with your health care provider. °Constipation °Constipation is when a person has fewer than three bowel movements a week, has difficulty having a bowel movement, or has stools that are dry, hard, or larger than normal. As people grow older, constipation is more common. If you try to fix constipation with medicines that make you have a bowel movement (laxatives), the problem may get worse. Long-term laxative use may cause the muscles of the colon to become weak. A low-fiber diet, not taking in enough fluids, and taking certain medicines may make constipation worse.  °CAUSES  °· Certain medicines, such as antidepressants, pain medicine, iron supplements, antacids, and water pills.   °· Certain diseases, such as diabetes, irritable bowel syndrome (IBS), thyroid disease, or depression.   °· Not drinking enough water.   °· Not eating enough fiber-rich foods.   °· Stress or travel.   °· Lack of physical activity or exercise.   °· Ignoring the urge to have a bowel movement.   °· Using laxatives too much.   °SIGNS AND SYMPTOMS  °· Having fewer than three bowel movements a week.   °· Straining to have a bowel movement.   °· Having stools that are hard,   dry, or larger than normal.   °· Feeling full or bloated.   °· Pain in the lower abdomen.   °· Not feeling relief after having a bowel movement.   °DIAGNOSIS  °Your health care provider will take a medical history and perform a physical exam. Further testing may be done for severe constipation. Some tests may include: °· A barium enema X-ray to examine your rectum, colon, and, sometimes, your small  intestine.   °· A sigmoidoscopy to examine your lower colon.   °· A colonoscopy to examine your entire colon. °TREATMENT  °Treatment will depend on the severity of your constipation and what is causing it. Some dietary treatments include drinking more fluids and eating more fiber-rich foods. Lifestyle treatments may include regular exercise. If these diet and lifestyle recommendations do not help, your health care provider may recommend taking over-the-counter laxative medicines to help you have bowel movements. Prescription medicines may be prescribed if over-the-counter medicines do not work.  °HOME CARE INSTRUCTIONS  °· Eat foods that have a lot of fiber, such as fruits, vegetables, whole grains, and beans. °· Limit foods high in fat and processed sugars, such as french fries, hamburgers, cookies, candies, and soda.   °· A fiber supplement may be added to your diet if you cannot get enough fiber from foods.   °· Drink enough fluids to keep your urine clear or pale yellow.   °· Exercise regularly or as directed by your health care provider.   °· Go to the restroom when you have the urge to go. Do not hold it.   °· Only take over-the-counter or prescription medicines as directed by your health care provider. Do not take other medicines for constipation without talking to your health care provider first.   °SEEK IMMEDIATE MEDICAL CARE IF:  °· You have bright red blood in your stool.   °· Your constipation lasts for more than 4 days or gets worse.   °· You have abdominal or rectal pain.   °· You have thin, pencil-like stools.   °· You have unexplained weight loss. °MAKE SURE YOU:  °· Understand these instructions. °· Will watch your condition. °· Will get help right away if you are not doing well or get worse. °Document Released: 02/27/2004 Document Revised: 06/05/2013 Document Reviewed: 03/12/2013 °ExitCare® Patient Information ©2015 [employer]. This information is not intended to replace advice given to you by  your health care provider. Make sure you discuss any questions you have with your health care provider. ° °

## 2014-08-02 ENCOUNTER — Encounter: Payer: Self-pay | Admitting: Pulmonary Disease

## 2014-08-02 ENCOUNTER — Ambulatory Visit (INDEPENDENT_AMBULATORY_CARE_PROVIDER_SITE_OTHER): Payer: No Typology Code available for payment source | Admitting: Pulmonary Disease

## 2014-08-02 ENCOUNTER — Ambulatory Visit (HOSPITAL_COMMUNITY)
Admission: RE | Admit: 2014-08-02 | Discharge: 2014-08-02 | Disposition: A | Discharge status: Home / Self Care | Payer: Self-pay | Source: Ambulatory Visit | Attending: Pulmonary Disease | Admitting: Pulmonary Disease

## 2014-08-02 VITALS — BP 110/80 | HR 86 | Temp 97.0°F | Ht 63.0 in | Wt 142.0 lb

## 2014-08-02 DIAGNOSIS — J438 Other emphysema: Secondary | ICD-10-CM

## 2014-08-02 DIAGNOSIS — R0609 Other forms of dyspnea: Secondary | ICD-10-CM | POA: Insufficient documentation

## 2014-08-02 LAB — PULMONARY FUNCTION TEST
DL/VA % pred: 62 %
DL/VA: 2.93 ml/min/mmHg/L
DLCO COR % PRED: 54 %
DLCO cor: 12.5 ml/min/mmHg
DLCO unc % pred: 54 %
DLCO unc: 12.42 ml/min/mmHg
FEF 25-75 PRE: 0.96 L/s
FEF 25-75 Post: 1.77 L/sec
FEF2575-%CHANGE-POST: 85 %
FEF2575-%PRED-PRE: 38 %
FEF2575-%Pred-Post: 72 %
FEV1-%Change-Post: 19 %
FEV1-%PRED-PRE: 63 %
FEV1-%Pred-Post: 76 %
FEV1-Post: 1.95 L
FEV1-Pre: 1.63 L
FEV1FVC-%CHANGE-POST: 7 %
FEV1FVC-%Pred-Pre: 83 %
FEV6-%CHANGE-POST: 12 %
FEV6-%PRED-POST: 86 %
FEV6-%PRED-PRE: 76 %
FEV6-Post: 2.75 L
FEV6-Pre: 2.44 L
FEV6FVC-%Change-Post: 0 %
FEV6FVC-%PRED-PRE: 101 %
FEV6FVC-%Pred-Post: 102 %
FVC-%Change-Post: 11 %
FVC-%PRED-POST: 83 %
FVC-%Pred-Pre: 75 %
FVC-POST: 2.76 L
FVC-PRE: 2.47 L
POST FEV1/FVC RATIO: 71 %
Post FEV6/FVC ratio: 100 %
Pre FEV1/FVC ratio: 66 %
Pre FEV6/FVC Ratio: 99 %
RV % pred: 180 %
RV: 3.36 L
TLC % pred: 116 %
TLC: 5.72 L

## 2014-08-02 MED ORDER — ALBUTEROL SULFATE (2.5 MG/3ML) 0.083% IN NEBU
2.5000 mg | INHALATION_SOLUTION | Freq: Once | RESPIRATORY_TRACT | Status: AC
  Administered 2014-08-02: 2.5 mg via RESPIRATORY_TRACT

## 2014-08-02 NOTE — Progress Notes (Signed)
   Subjective:    Patient ID: Maria Oliver, female    DOB: March 12, 1961, 54 y.o.   MRN: [id number]  HPI Patient comes in today for follow-up of her recent PFTs, done as part of a workup for dyspnea on exertion. She was found to have mild airflow obstruction, but a 19% improvement in her FEV1 with bronchodilators. She was also noted to have air-trapping, but no restriction. Her diffusion capacity was moderately reduced, indicating underlying emphysema. I have reviewed the study with her in detail, and answered all of her questions. Unfortunately, she continues to smoke.   Review of Systems  Constitutional: Negative for fever, chills, activity change, fatigue and unexpected weight change.  HENT: Positive for congestion and postnasal drip. Negative for dental problem, drooling, ear discharge, ear pain, hearing loss, nosebleeds, rhinorrhea, sinus pressure, sneezing, sore throat and trouble swallowing.   Eyes: Negative.  Negative for discharge, redness and itching.  Respiratory: Positive for cough, chest tightness, shortness of breath and wheezing. Negative for apnea and stridor.   Cardiovascular: Negative.  Negative for palpitations and leg swelling.  Gastrointestinal: Negative.  Negative for nausea, vomiting and abdominal distention.  Endocrine: Negative for cold intolerance and polyphagia.  Genitourinary: Negative for dysuria.  Musculoskeletal: Negative.  Negative for joint swelling.  Skin: Negative.  Negative for rash.  Allergic/Immunologic: Negative.   Neurological: Positive for dizziness and weakness. Negative for tremors, seizures, speech difficulty, light-headedness and headaches.  Hematological: Negative.  Does not bruise/bleed easily.  Psychiatric/Behavioral: Negative.  Negative for dysphoric mood. The patient is not nervous/anxious.        Objective:   Physical Exam Well-developed female in no acute distress Nose without purulence or discharge noted Neck without lymphadenopathy or  thyromegaly Chest with decreased breath sounds throughout, no wheezing Cardiac exam with regular rate and rhythm Lower extremities with minimal edema, no cyanosis Alert and oriented, moves all 4 extremities.       Assessment & Plan:

## 2014-08-02 NOTE — Assessment & Plan Note (Signed)
The patient has mild airflow obstruction by her recent PFTs, and has a significant response to bronchodilators. 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 I will start her on an aggressive bronchodilator regimen because of her symptoms, but I stressed to her the importance of total smoking cessation.

## 2014-08-02 NOTE — Patient Instructions (Signed)
Will start on stiolto 2 inhalations each am everyday no matter what You can continue your albuterol as needed for rescue You need to stop smoking now, so you can preserve your lung function followup with me again in 6mos.

## 2014-08-07 ENCOUNTER — Other Ambulatory Visit: Payer: Self-pay | Admitting: Internal Medicine

## 2014-09-16 ENCOUNTER — Other Ambulatory Visit: Payer: Self-pay | Admitting: Internal Medicine

## 2014-09-16 ENCOUNTER — Telehealth: Payer: Self-pay

## 2014-09-16 MED ORDER — HYDROCHLOROTHIAZIDE 25 MG PO TABS: 25.0000 mg | ORAL_TABLET | Freq: Every day | ORAL | Status: DC

## 2014-09-16 NOTE — Telephone Encounter (Signed)
Patient called requesting a refill on her HCTZ [facility]

## 2014-09-18 ENCOUNTER — Other Ambulatory Visit: Payer: Self-pay | Admitting: Internal Medicine

## 2014-11-24 ENCOUNTER — Encounter (HOSPITAL_COMMUNITY): Payer: Self-pay | Admitting: *Deleted

## 2014-11-24 DIAGNOSIS — R112 Nausea with vomiting, unspecified: Secondary | ICD-10-CM | POA: Insufficient documentation

## 2014-11-24 DIAGNOSIS — Z9851 Tubal ligation status: Secondary | ICD-10-CM | POA: Insufficient documentation

## 2014-11-24 DIAGNOSIS — R1084 Generalized abdominal pain: Secondary | ICD-10-CM | POA: Insufficient documentation

## 2014-11-24 DIAGNOSIS — F419 Anxiety disorder, unspecified: Secondary | ICD-10-CM | POA: Insufficient documentation

## 2014-11-24 DIAGNOSIS — I10 Essential (primary) hypertension: Secondary | ICD-10-CM | POA: Insufficient documentation

## 2014-11-24 DIAGNOSIS — Z8719 Personal history of other diseases of the digestive system: Secondary | ICD-10-CM | POA: Insufficient documentation

## 2014-11-24 DIAGNOSIS — M1990 Unspecified osteoarthritis, unspecified site: Secondary | ICD-10-CM | POA: Insufficient documentation

## 2014-11-24 DIAGNOSIS — Z880 Allergy status to penicillin: Secondary | ICD-10-CM | POA: Insufficient documentation

## 2014-11-24 DIAGNOSIS — R079 Chest pain, unspecified: Secondary | ICD-10-CM | POA: Insufficient documentation

## 2014-11-24 DIAGNOSIS — Z720 Tobacco use: Secondary | ICD-10-CM | POA: Insufficient documentation

## 2014-11-24 DIAGNOSIS — J449 Chronic obstructive pulmonary disease, unspecified: Secondary | ICD-10-CM | POA: Insufficient documentation

## 2014-11-24 DIAGNOSIS — Z79899 Other long term (current) drug therapy: Secondary | ICD-10-CM | POA: Insufficient documentation

## 2014-11-24 MED ORDER — ONDANSETRON 4 MG PO TBDP
8.0000 mg | ORAL_TABLET | Freq: Once | ORAL | Status: AC
  Administered 2014-11-24: 8 mg via ORAL
  Filled 2014-11-24: qty 2

## 2014-11-24 NOTE — ED Notes (Signed)
The pt is c/o  And pain that caused her chest to hurt with nv  No diarrhea.  Hyperventilating.  Feeling dizzy.

## 2014-11-25 ENCOUNTER — Encounter (HOSPITAL_COMMUNITY): Payer: Self-pay | Admitting: Radiology

## 2014-11-25 ENCOUNTER — Emergency Department (HOSPITAL_COMMUNITY): Payer: Self-pay

## 2014-11-25 ENCOUNTER — Emergency Department (HOSPITAL_COMMUNITY)
Admission: EM | Admit: 2014-11-25 | Discharge: 2014-11-25 | Disposition: A | Discharge status: Home / Self Care | Payer: Self-pay | Attending: Emergency Medicine | Admitting: Emergency Medicine

## 2014-11-25 ENCOUNTER — Emergency Department (HOSPITAL_COMMUNITY): Payer: No Typology Code available for payment source

## 2014-11-25 DIAGNOSIS — R109 Unspecified abdominal pain: Secondary | ICD-10-CM

## 2014-11-25 DIAGNOSIS — R112 Nausea with vomiting, unspecified: Secondary | ICD-10-CM

## 2014-11-25 LAB — URINALYSIS, ROUTINE W REFLEX MICROSCOPIC
GLUCOSE, UA: NEGATIVE mg/dL
HGB URINE DIPSTICK: NEGATIVE
Ketones, ur: 15 mg/dL — AB
Leukocytes, UA: NEGATIVE
Nitrite: NEGATIVE
PROTEIN: 100 mg/dL — AB
Specific Gravity, Urine: 1.031 — ABNORMAL HIGH (ref 1.005–1.030)
UROBILINOGEN UA: 1 mg/dL (ref 0.0–1.0)
pH: 5 (ref 5.0–8.0)

## 2014-11-25 LAB — COMPREHENSIVE METABOLIC PANEL
ALT: 12 U/L — ABNORMAL LOW (ref 14–54)
AST: 17 U/L (ref 15–41)
Albumin: 4.5 g/dL (ref 3.5–5.0)
Alkaline Phosphatase: 68 U/L (ref 38–126)
Anion gap: 12 (ref 5–15)
BILIRUBIN TOTAL: 0.4 mg/dL (ref 0.3–1.2)
BUN: 10 mg/dL (ref 6–20)
CHLORIDE: 102 mmol/L (ref 101–111)
CO2: 28 mmol/L (ref 22–32)
Calcium: 10 mg/dL (ref 8.9–10.3)
Creatinine, Ser: 0.9 mg/dL (ref 0.44–1.00)
GFR calc Af Amer: >60 mL/min (ref >60)
GFR calc non Af Amer: >60 mL/min (ref >60)
Glucose, Bld: 159 mg/dL — ABNORMAL HIGH (ref 65–99)
POTASSIUM: 3.8 mmol/L (ref 3.5–5.1)
Sodium: 142 mmol/L (ref 135–145)
TOTAL PROTEIN: 7.2 g/dL (ref 6.5–8.1)

## 2014-11-25 LAB — CBC WITH DIFFERENTIAL/PLATELET
BASOS PCT: 0 % (ref 0–1)
Basophils Absolute: 0 10*3/uL (ref 0.0–0.1)
Eosinophils Absolute: 0.3 10*3/uL (ref 0.0–0.7)
Eosinophils Relative: 3 % (ref 0–5)
HEMATOCRIT: 46.3 % — AB (ref 36.0–46.0)
HEMOGLOBIN: 15.6 g/dL — AB (ref 12.0–15.0)
LYMPHS PCT: 22 % (ref 12–46)
Lymphs Abs: 2.6 10*3/uL (ref 0.7–4.0)
MCH: 28.9 pg (ref 26.0–34.0)
MCHC: 33.7 g/dL (ref 30.0–36.0)
MCV: 85.7 fL (ref 78.0–100.0)
MONO ABS: 0.8 10*3/uL (ref 0.1–1.0)
MONOS PCT: 6 % (ref 3–12)
NEUTROS ABS: 8.1 10*3/uL — AB (ref 1.7–7.7)
Neutrophils Relative %: 69 % (ref 43–77)
Platelets: 350 10*3/uL (ref 150–400)
RBC: 5.4 MIL/uL — ABNORMAL HIGH (ref 3.87–5.11)
RDW: 14.6 % (ref 11.5–15.5)
WBC: 11.8 10*3/uL — ABNORMAL HIGH (ref 4.0–10.5)

## 2014-11-25 LAB — URINE MICROSCOPIC-ADD ON

## 2014-11-25 LAB — LIPASE, BLOOD: LIPASE: 14 U/L — AB (ref 22–51)

## 2014-11-25 LAB — TROPONIN I: Troponin I: <0.03 ng/mL (ref <0.031)

## 2014-11-25 MED ORDER — ONDANSETRON HCL 4 MG PO TABS: 4.0000 mg | ORAL_TABLET | Freq: Three times a day (TID) | ORAL | Status: DC | PRN

## 2014-11-25 MED ORDER — ONDANSETRON HCL 4 MG/2ML IJ SOLN
4.0000 mg | INTRAMUSCULAR | Status: DC | PRN
  Administered 2014-11-25: 4 mg via INTRAVENOUS
  Filled 2014-11-25: qty 2

## 2014-11-25 MED ORDER — DICYCLOMINE HCL 20 MG PO TABS: 20.0000 mg | ORAL_TABLET | Freq: Four times a day (QID) | ORAL | Status: DC | PRN

## 2014-11-25 MED ORDER — IOHEXOL 300 MG/ML  SOLN
25.0000 mL | Freq: Once | INTRAMUSCULAR | Status: AC | PRN
  Administered 2014-11-25: 25 mL via INTRAVENOUS

## 2014-11-25 MED ORDER — IOHEXOL 300 MG/ML  SOLN
100.0000 mL | Freq: Once | INTRAMUSCULAR | Status: AC | PRN
  Administered 2014-11-25: 100 mL via INTRAVENOUS

## 2014-11-25 MED ORDER — SODIUM CHLORIDE 0.9 % IV SOLN
INTRAVENOUS | Status: DC
  Administered 2014-11-25: 04:00:00 via INTRAVENOUS

## 2014-11-25 MED ORDER — MORPHINE SULFATE 4 MG/ML IJ SOLN
4.0000 mg | INTRAMUSCULAR | Status: DC | PRN
  Administered 2014-11-25: 4 mg via INTRAVENOUS
  Filled 2014-11-25: qty 1

## 2014-11-25 NOTE — Discharge Instructions (Signed)
°Emergency Department Resource Guide °1) Find a Doctor and Pay Out of Pocket °Although you won't have to find out who is covered by your insurance plan, it is a good idea to ask around and get recommendations. You will then need to call the office and see if the doctor you have chosen will accept you as a new patient and what types of options they offer for patients who are self-pay. Some doctors offer discounts or will set up payment plans for their patients who do not have insurance, but you will need to ask so you aren't surprised when you get to your appointment. ° °2) Contact Your Local Health Department °Not all health departments have doctors that can see patients for sick visits, but many do, so it is worth a call to see if yours does. If you don't know where your local health department is, you can check in your phone book. The CDC also has a tool to help you locate your state's health department, and many state websites also have listings of all of their local health departments. ° °3) Find a Walk-in Clinic °If your illness is not likely to be very severe or complicated, you may want to try a walk in clinic. These are popping up all over the country in pharmacies, drugstores, and shopping centers. They're usually staffed by nurse practitioners or physician assistants that have been trained to treat common illnesses and complaints. They're usually fairly quick and inexpensive. However, if you have serious medical issues or chronic medical problems, these are probably not your best option. ° °No Primary Care Doctor: °- Call [facility] at  832-8000 - they can help you locate a primary care doctor that  accepts your insurance, provides certain services, etc. °- Physician Referral Service- 1-800-533-3463 ° °Chronic Pain Problems: °Organization         Address  Phone   Notes  °[facility]  (336) 297-2271 Patients need to be referred by their primary care doctor.  ° °Medication  Assistance: °Organization         Address  Phone   Notes  °[place] [facility] [address] °[place], [place] [address] (336) 641-8030 --Must be a resident of [place] °-- Must have NO insurance coverage whatsoever (no Medicaid/ Medicare, etc.) °-- The pt. MUST have a primary care doctor that directs their care regularly and follows them in the community °  °[facility]  (866) 331-1348   °[employer]  (888) 892-1162   ° °Agencies that provide inexpensive medical care: °Organization         Address  Phone   Notes  °[facility]  (336) 832-8035   °[facility]    (336) 832-7272   °[facility] [address] °[place], [place] [address] (336) 832-4777   °[facility] [address], °[place] (336) 271-4999   °[facility]    (336) 373-0678   °[facility]    (336) 272-1050   °[facility] ° [address], [place] Phone:  (336) 832-4444, Fax:  (336) 832-4440 Hours of Operation:  9 am - 6 pm, M-F.  Also accepts Medicaid/Medicare and self-pay.  °[facility] ° [address], [place] Phone: (336) 832-3150, Fax: (336) 832-3151. Hours of Operation:  8:30 am - 5:30 pm, M-F.  Also accepts Medicaid and self-pay.  °[facility] [place] [address], [place] Phone: (336) 878-6027   °[facility] [address], [place], [place] (336)723-1848, Ext. 123 Mondays & Thursdays: 7-9 AM.  First 15 patients are seen on a first come, first serve basis. °  ° °Medicaid-accepting [place] Providers: ° °Organization         Address  Phone   Notes  °[facility] [address], [place] (336) 641-2100 Also accepts self-pay patients.  °[facility] [address], [place] ° (336) 856-9996   °[facility] [address], [place]  (336) 288-8857   °[facility] [address] [place] [address], [place] (336) 299-7000   °Veita Bland [address], [place]  ° (336) 373-1557 Only accepts [place] Access Medicaid patients after they have their name applied to their card.  ° °Self-Pay (no insurance) in [place]: ° °Organization         Address  Phone   Notes  °Sickle Cell Patients, [facility] [address], [place] (336) 832-1970   °[facility] [address], [place] (336) 832-4400   °[facility] ° [address], [place] (336) 992-4800   °[facility]/Dr. Osei-Bonsu ° [address] [place] [address], [place] or [address], [place] (336) 841-8500 Phone number for both [place] and [place] locations is the same.  °[facility] [address], [place] (336) 299-0000   °[facility] [address] [place] [address], [place] or [address] (336) 852-7530 °(336) 878-2260   °[facility] [address], [place] (336) 350-1642, phone; (336) 294-5005, fax Sees patients 1st and 3rd Saturday of every month.  Must not qualify for public or private insurance (i.e. Medicaid, Medicare, [place] Health Choice, Veterans' Benefits) • Household income should be no more than 200% of the poverty level •The clinic cannot treat you if you are pregnant or think you are pregnant • Sexually transmitted diseases are not treated at the clinic.  ° ° °Dental Care: °Organization         Address  Phone  Notes  °[place] [facility] [address], [place] (336) 641-6152 Accepts children up to age 21 who are enrolled in Medicaid or [place] Health Choice; pregnant women with a Medicaid card; and children who have applied for Medicaid or [place] Health Choice, but were declined, whose parents can pay a reduced fee at time of service.  °[place]  [facility] [place]  [address], [place] (336) 641-7733 Accepts children up to age 21 who are enrolled in Medicaid or [place] Health Choice; pregnant women with a Medicaid card; and children who have applied for Medicaid or [place] Health Choice, but were declined, whose parents can pay a reduced fee at time of service.  °[facility] ° [address], [place] (336) 641-4533 Patients are seen by appointment only. Walk-ins are not accepted. [facility] will see patients 18 years of age and older. °Monday - Tuesday (8am-5pm) °Most Wednesdays (8:30-5pm) °$30 per visit, cash only  °[facility] ° [address], [place] (336) 641-4533 Patients are seen by appointment only. Walk-ins are not accepted. [facility] will see patients 18 years of age and older. °One   Wednesday Evening (Monthly: Volunteer Based).  $30 per visit, cash only  °[facility]  (919) 537-3737 for adults; Children under age 4, call Graduate Pediatric Dentistry at ([phone]) [phone]. Children aged 4-14, please call (919) [phone] to request a pediatric application. ° Dental services are provided in all areas of dental care including fillings, crowns and bridges, complete and partial dentures, implants, gum treatment, root canals, and extractions. Preventive care is also provided. Treatment is provided to both adults and children. °Patients are selected via a lottery and there is often a waiting list. °  °[facility] [address], °[place] ° (336) 763-8833 [email or web address] °  °[facility] [address], [place], [place] (336)723-1848, Ext. 123 Second and Fourth Thursday of each month, opens at 6:30 AM; Clinic ends at 9 AM.  Patients are seen on a first-come first-served basis, and a limited number are seen during each clinic.  ° °[facility] ° [address], [place], [place] (336) 723-7904    Eligibility Requirements °You must have lived in [place], [place], or [place] counties for at least the last three months. °  You cannot be eligible for state or federal sponsored healthcare insurance, including Veterans Administration, Medicaid, or Medicare. °  You generally cannot be eligible for healthcare insurance through your employer.  °  How to apply: °Eligibility screenings are held every Tuesday and Wednesday afternoon from 1:00 pm until 4:00 pm. You do not need an appointment for the interview!  °[facility] [address], [place], [place] 336-631-2330   °[facility]  336-342-8273   °[facility]  336-703-3100   °[facility]  336-570-6415   ° °Behavioral Health Resources in the Community: °Intensive Outpatient Programs °Organization         Address  Phone  Notes  °[facility] [address], [place], [place] 336-878-6098   °[facility] [address], [place], [place] 336-832-9800   °[facility] [address], [place], [place] ° 336-882-2125   °[facility] [address],  °[place], [place] 1-800-853-5163 or 336-641-4981   °Substance Abuse Resources °Organization         Address  Phone  Notes  °[facility]  336-882-2125   °[facility]  336-784-9470   °[facility]  336-285-9073   °[facility]  336-845-3988   °Residential & Outpatient Substance Abuse Program  1-800-659-3381   °Psychological Services °Organization         Address  Phone  Notes  °[facility]  336- 832-9600   °[facility]  336- 378-7881   °[facility] [address], [place] 1-800-853-5163 or 336-641-4981   ° °Mobile Crisis Teams °Organization         Address  Phone  Notes  °[facility], Mobile Crisis Care Unit  1-877-626-1772   °Assertive °[facility] ° [address]  [place], [place] 336-834-9664   °Sharon DeEsch [address] °[place] 336-554-5454   ° °Self-Help/Support Groups °Organization         Address  Phone             Notes  °[facility]  - variety of support groups  336- 373-1402 Call for more information  °Narcotics Anonymous (NA), [facility] [address], °[place]  2 meetings at this location  ° °  Residential Treatment Programs Organization         Address  Phone  Notes  [facility] [address],    [place]  8-372-902-1115   [facility]  [address], [place] [address], [place], [place] 233-612-2449   [facility] [address] [place], [place] 753-005-1102 Admissions: 8am-3pm M-F  [facility] [address]    [place], [place] 111-735-6701   [facility] [address] [place], [place], [place] 410-301-3143   [facility] [address],  [place], [place] 888-757-9728   [facility] Intensive Outpatient [address], [place], [place] 206-015-6153   [facility] [address]  [place], [place] 7-943-276-1470 or 430-194-1765   [facility] [address], [place], [place] 370-964-3838 Accepts Medicaid  [facility] [address],  [place] 1-840-375-4360 Substance Abuse/Addiction Treatment   [facility]         Address  Phone  Notes  [facility]  269-035-8098   Angie Fava, PhD [address] [place], [place]   262-040-4758 or 305-370-2194   [facility]   [address] [place], [place] 602-216-6750   [facility] [address] [place], [place] (430) 632-5019 Insurance/Medicaid/sponsorship through [facility] [address]                                    [place], [place] 918-325-4028 Therapy/tele-psych/case    [facility] [address], [place] [phone]    Dr. Lolly Mustache  (845)250-9653   [facility]  [employer] [facility] Dept. 1) [address] [place] 2) [address] [place] 3)  [address], [place] [phone] 618-139-3294  (732) [phone]   [facility] Child Abuse Hotline 628-411-7097 or (616) 759-2373 (After Hours)      Take the prescriptions as directed.  Increase your fluid intake (ie:  Gatoraide) for the next few days, as discussed.  Eat a bland diet and advance to your regular diet slowly as you can tolerate it.  Call your regular medical doctor today to schedule a follow up appointment in the next 2 days.  Return to the Emergency Department immediately if not improving (or even worsening) despite taking the medicines as prescribed, any black or bloody stool or vomit, if you develop a fever over "101," or for any other concerns.

## 2014-11-25 NOTE — ED Notes (Signed)
Pt placed on 2L [place]

## 2014-11-25 NOTE — ED Provider Notes (Signed)
CSN: [id number]     Arrival date & time 11/24/14  2334 History   First MD Initiated Contact with Patient 11/25/14 0201     Chief Complaint  Patient presents with  . Chest Pain  . Abdominal Pain     HPI Pt was seen at 0210.  Per pt, c/o gradual onset and persistence of constant generalized abd "pain" for the past several hours.  Has been associated with multiple intermittent episodes of N/V and "passing a lot of gas and belching a lot."  Describes the abd pain as "cramping."  Denies diarrhea, no fevers, no back pain, no rash, no CP/SOB, no black or blood in stools or emesis.     Past Medical History  Diagnosis Date  . Arthritis   . COPD (chronic obstructive pulmonary disease)   . Hypertension   . Generalized headaches   . Leg swelling   . Abdominal pain   . Anxiety   . Hernia, inguinal, right    Past Surgical History  Procedure Laterality Date  . Hernia repair  2010  . Colon surgery  2010    per patient "colon take down"  . Colostomy  2010  . Tubal ligation     Family History  Problem Relation Age of Onset  . Heart disease Mother 23  . Heart failure Mother   . Asthma Mother   . Alzheimer's disease Father 8  . Colon cancer Neg Hx   . Emphysema Mother   . Emphysema Brother   . Asthma Brother    History  Substance Use Topics  . Smoking status: Current Every Day Smoker -- 0.50 packs/day for 40 years    Types: Cigarettes  . Smokeless tobacco: Never Used  . Alcohol Use: No    Review of Systems ROS: Statement: All systems negative except as marked or noted in the HPI; Constitutional: Negative for fever and chills. ; ; Eyes: Negative for eye pain, redness and discharge. ; ; ENMT: Negative for ear pain, hoarseness, nasal congestion, sinus pressure and sore throat. ; ; Cardiovascular: Negative for chest pain, palpitations, diaphoresis, dyspnea and peripheral edema. ; ; Respiratory: Negative for cough, wheezing and stridor. ; ; Gastrointestinal: +N/V, abd pain. Negative for  diarrhea, blood in stool, hematemesis, jaundice and rectal bleeding. ; ; Genitourinary: Negative for dysuria, flank pain and hematuria. ; ; Musculoskeletal: Negative for back pain and neck pain. Negative for swelling and trauma.; ; Skin: Negative for pruritus, rash, abrasions, blisters, bruising and skin lesion.; ; Neuro: Negative for headache, lightheadedness and neck stiffness. Negative for weakness, altered level of consciousness , altered mental status, extremity weakness, paresthesias, involuntary movement, seizure and syncope.      Allergies  Aspirin; Ciprofloxacin; Codeine; and Penicillins  Home Medications   Prior to Admission medications   Medication Sig Start Date End Date Taking? Authorizing Provider  cyclobenzaprine (FLEXERIL) 10 MG tablet Take 1 tablet (10 mg total) by mouth at bedtime. Patient taking differently: Take 10 mg by mouth at bedtime as needed for muscle spasms.  07/09/14  Yes [name], MD  hydrochlorothiazide (HYDRODIURIL) 25 MG tablet Take 1 tablet (25 mg total) by mouth daily. 09/16/14  Yes [name], MD  ibuprofen (ADVIL,MOTRIN) 200 MG tablet Take 400 mg by mouth every 6 (six) hours as needed for moderate pain.   Yes Historical Provider, MD  PARoxetine (PAXIL) 20 MG tablet Take 20 mg by mouth daily.   Yes Historical Provider, MD  polyethylene glycol (MIRALAX) packet Take 17 g by mouth  2 (two) times daily. Patient taking differently: Take 17 g by mouth daily as needed for mild constipation.  07/27/14  Yes Shari Upstill, PA-C  VENTOLIN HFA 108 (90 BASE) MCG/ACT inhaler INHALE 2 PUFFS INTO THE LUNGS EVERY 6 HOURS AS NEEDED FOR WHEEZING OR SHORTNESS OF BREATH. (MAP) 09/20/14  Yes Deepak Advani, MD   BP 165/90 mmHg  Pulse 58  Temp(Src) 97.5 F (36.4 C) (Oral)  Resp 32  SpO2 99% Physical Exam  0215: Physical examination:  Nursing notes reviewed; Vital signs and O2 SAT reviewed;  Constitutional: Well developed, Well nourished, Well hydrated, Anxious and  intermittently hyperventilating.; Head:  Normocephalic, atraumatic; Eyes: EOMI, PERRL, No scleral icterus; ENMT: Mouth and pharynx normal, Mucous membranes moist; Neck: Supple, Full range of motion, No lymphadenopathy; Cardiovascular: Regular rate and rhythm, No gallop; Respiratory: Breath sounds clear & equal bilaterally, No rales, rhonchi, wheezes.  Speaking full sentences with ease, Normal respiratory effort/excursion; Chest: Nontender, Movement normal; Abdomen: Soft, +diffuse tenderness to palp. No rebound or guarding. Soft, reducible ventral hernia. Nondistended, Normal bowel sounds; Genitourinary: No CVA tenderness; Extremities: Pulses normal, No tenderness, No edema, No calf edema or asymmetry.; Neuro: AA&Ox3, Major CN grossly intact.  Speech clear. No gross focal motor or sensory deficits in extremities.; Skin: Color normal, Warm, Dry.   ED Course  Procedures     EKG Interpretation   Date/Time:  Sunday November 24 2014 23:41:43 EDT Ventricular Rate:  55 PR Interval:    QRS Duration: 74 QT Interval:  500 QTC Calculation: 478 R Axis:   77 Text Interpretation:  Normal sinus rhythm Low voltage QRS Cannot rule out  Anterior infarct , age undetermined Artifact When compared with ECG of  07/18/2002 No significant change was found Confirmed by [facility]  MD,  Nicholos Johns [phone]) on 11/25/2014 3:27:27 AM      MDM  MDM Reviewed: previous chart, nursing note and vitals Reviewed previous: labs and ECG Interpretation: labs, ECG and x-ray     Results for orders placed or performed during the hospital encounter of 11/25/14  CBC with Differential  Result Value Ref Range   WBC 11.8 (H) 4.0 - 10.5 K/uL   RBC 5.40 (H) 3.87 - 5.11 MIL/uL   Hemoglobin 15.6 (H) 12.0 - 15.0 g/dL   HCT 91.4 (H) 78.2 - 95.6 %   MCV 85.7 78.0 - 100.0 fL   MCH 28.9 26.0 - 34.0 pg   MCHC 33.7 30.0 - 36.0 g/dL   RDW 21.3 08.6 - 57.8 %   Platelets 350 150 - 400 K/uL   Neutrophils Relative % 69 43 - 77 %   Neutro Abs 8.1  (H) 1.7 - 7.7 K/uL   Lymphocytes Relative 22 12 - 46 %   Lymphs Abs 2.6 0.7 - 4.0 K/uL   Monocytes Relative 6 3 - 12 %   Monocytes Absolute 0.8 0.1 - 1.0 K/uL   Eosinophils Relative 3 0 - 5 %   Eosinophils Absolute 0.3 0.0 - 0.7 K/uL   Basophils Relative 0 0 - 1 %   Basophils Absolute 0.0 0.0 - 0.1 K/uL  Comprehensive metabolic panel  Result Value Ref Range   Sodium 142 135 - 145 mmol/L   Potassium 3.8 3.5 - 5.1 mmol/L   Chloride 102 101 - 111 mmol/L   CO2 28 22 - 32 mmol/L   Glucose, Bld 159 (H) 65 - 99 mg/dL   BUN 10 6 - 20 mg/dL   Creatinine, Ser 4.69 0.44 - 1.00 mg/dL   Calcium  10.0 8.9 - 10.3 mg/dL   Total Protein 7.2 6.5 - 8.1 g/dL   Albumin 4.5 3.5 - 5.0 g/dL   AST 17 15 - 41 U/L   ALT 12 (L) 14 - 54 U/L   Alkaline Phosphatase 68 38 - 126 U/L   Total Bilirubin 0.4 0.3 - 1.2 mg/dL   GFR calc non Af Amer >60 >60 mL/min   GFR calc Af Amer >60 >60 mL/min   Anion gap 12 5 - 15  Lipase, blood  Result Value Ref Range   Lipase 14 (L) 22 - 51 U/L  Urinalysis, Routine w reflex microscopic (not at [facility])  Result Value Ref Range   Color, Urine AMBER (A) YELLOW   APPearance TURBID (A) CLEAR   Specific Gravity, Urine 1.031 (H) 1.005 - 1.030   pH 5.0 5.0 - 8.0   Glucose, UA NEGATIVE NEGATIVE mg/dL   Hgb urine dipstick NEGATIVE NEGATIVE   Bilirubin Urine SMALL (A) NEGATIVE   Ketones, ur 15 (A) NEGATIVE mg/dL   Protein, ur 161 (A) NEGATIVE mg/dL   Urobilinogen, UA 1.0 0.0 - 1.0 mg/dL   Nitrite NEGATIVE NEGATIVE   Leukocytes, UA NEGATIVE NEGATIVE  Troponin I  Result Value Ref Range   Troponin I <0.03 <0.031 ng/mL  Urine microscopic-add on  Result Value Ref Range   Squamous Epithelial / LPF FEW (A) RARE   WBC, UA 0-2 <3 WBC/hpf   RBC / HPF 0-2 <3 RBC/hpf   Crystals CA OXALATE CRYSTALS (A) NEGATIVE   Urine-Other AMORPHOUS URATES/PHOSPHATES    Dg Chest 2 View 11/25/2014   CLINICAL DATA:  Substernal chest pain and left shoulder pain since earlier today. Smoker.  EXAM: CHEST  2  VIEW  COMPARISON:  07/26/2014  FINDINGS: Hyperinflation suggesting emphysema. Normal heart size and pulmonary vascularity. Linear fibrosis or atelectasis in the lung bases similar to prior study. No focal consolidation or airspace disease in the lungs. No blunting of costophrenic angles. No pneumothorax. Calcification of the aorta.  IMPRESSION: Emphysematous changes in the lungs. Linear fibrosis in the lung bases. No evidence of active pulmonary disease.   Electronically Signed   By: Burman Nieves M.D.   On: 11/25/2014 03:08   Ct Abdomen Pelvis W Contrast 11/25/2014   CLINICAL DATA:  Diffuse abdominal pain for 1 day. History of multiple colon surgeries secondary to perforation.  EXAM: CT ABDOMEN AND PELVIS WITH CONTRAST  TECHNIQUE: Multidetector CT imaging of the abdomen and pelvis was performed using the standard protocol following bolus administration of intravenous contrast.  CONTRAST:  OMNIPAQUE IOHEXOL 300 MG/ML  SOLN  COMPARISON:  CT abdomen and pelvis July 26, 2014  FINDINGS: LUNG BASES: Included view of the lung bases demonstrate dependent atelectasis. Visualized heart and pericardium are unremarkable.  SOLID ORGANS: The liver, gallbladder, pancreas and adrenal glands are unremarkable. The spleen demonstrates multiple tiny hypodensities measuring up to 9 mm, relatively unchanged and nonspecific.  GASTROINTESTINAL TRACT: Wide neck ventral abdominal hernia with ligamentous laxity, multiple loops of small bowel course and out of the hernia sac. The stomach, small and large bowel are normal in course and caliber without inflammatory changes. The appendix is not discretely identified, however there are no inflammatory changes in the right lower quadrant.  KIDNEYS/ URINARY TRACT: Kidneys are orthotopic, demonstrating symmetric enhancement. No nephrolithiasis, hydronephrosis or solid renal masses. LEFT interpolar scarring. Low 10 mm LEFT interpolar cyst. Too small to characterize LEFT interpolar  hypodensity. The unopacified ureters are normal in course and caliber. Delayed imaging through  the kidneys demonstrates symmetric prompt contrast excretion within the proximal urinary collecting system. Urinary bladder is decompressed and unremarkable.  PERITONEUM/RETROPERITONEUM: Aortoiliac vessels are normal in course and caliber, mild calcific atherosclerosis. No lymphadenopathy by CT size criteria. Internal reproductive organs are unremarkable. No intraperitoneal free fluid nor free air.  SOFT TISSUE/OSSEOUS STRUCTURES: Non-suspicious. LEFT paraspinal muscle tubular 10 mm hypodensity nonspecific, possible focal fatty atrophy.  IMPRESSION: No acute intra-abdominal or pelvic process.  Stable splenic hypodensities, nonspecific though likely benign.  Anterior abdominal wall ligamentous laxity and wide neck ventral hernia containing multiple loops of small bowel without obstruction or strangulation, stable from prior imaging.   Electronically Signed   By: Awilda Metro M.D.   On: 11/25/2014 04:45    0555:  Pt has tol PO well while in the ED without N/V.  No stooling while in the ED. Abd benign, VSS. Feels better and wants to go home now.  Tx symptomatically at this time. Dx and testing d/w pt.  Questions answered.  Verb understanding, agreeable to d/c home with outpt f/u.    Samuel Jester, DO 11/28/14 1321

## 2014-12-31 ENCOUNTER — Ambulatory Visit: Payer: No Typology Code available for payment source | Attending: Family Medicine | Admitting: Family Medicine

## 2014-12-31 ENCOUNTER — Encounter: Payer: Self-pay | Admitting: Family Medicine

## 2014-12-31 ENCOUNTER — Ambulatory Visit (HOSPITAL_COMMUNITY)
Admission: RE | Admit: 2014-12-31 | Discharge: 2014-12-31 | Disposition: A | Discharge status: Home / Self Care | Payer: No Typology Code available for payment source | Source: Ambulatory Visit | Attending: Family Medicine | Admitting: Family Medicine

## 2014-12-31 ENCOUNTER — Encounter (HOSPITAL_BASED_OUTPATIENT_CLINIC_OR_DEPARTMENT_OTHER): Payer: No Typology Code available for payment source | Admitting: Clinical

## 2014-12-31 VITALS — BP 142/90 | HR 61 | Temp 98.9°F | Ht 64.0 in | Wt 150.0 lb

## 2014-12-31 DIAGNOSIS — F329 Major depressive disorder, single episode, unspecified: Secondary | ICD-10-CM | POA: Insufficient documentation

## 2014-12-31 DIAGNOSIS — Z79899 Other long term (current) drug therapy: Secondary | ICD-10-CM | POA: Insufficient documentation

## 2014-12-31 DIAGNOSIS — F419 Anxiety disorder, unspecified: Secondary | ICD-10-CM

## 2014-12-31 DIAGNOSIS — I10 Essential (primary) hypertension: Secondary | ICD-10-CM | POA: Insufficient documentation

## 2014-12-31 DIAGNOSIS — J449 Chronic obstructive pulmonary disease, unspecified: Secondary | ICD-10-CM | POA: Insufficient documentation

## 2014-12-31 DIAGNOSIS — K439 Ventral hernia without obstruction or gangrene: Secondary | ICD-10-CM

## 2014-12-31 DIAGNOSIS — R109 Unspecified abdominal pain: Secondary | ICD-10-CM | POA: Insufficient documentation

## 2014-12-31 DIAGNOSIS — F17210 Nicotine dependence, cigarettes, uncomplicated: Secondary | ICD-10-CM | POA: Insufficient documentation

## 2014-12-31 DIAGNOSIS — R1084 Generalized abdominal pain: Secondary | ICD-10-CM

## 2014-12-31 LAB — POCT URINALYSIS DIPSTICK
BILIRUBIN UA: NEGATIVE
Blood, UA: NEGATIVE
Glucose, UA: NEGATIVE
Ketones, UA: NEGATIVE
Leukocytes, UA: NEGATIVE
NITRITE UA: NEGATIVE
Protein, UA: NEGATIVE
Spec Grav, UA: 1.02
UROBILINOGEN UA: 0.2
pH, UA: 7.5

## 2014-12-31 MED ORDER — LACTULOSE 10 GM/15ML PO SOLN: 10.0000 g | Freq: Three times a day (TID) | ORAL | Status: DC

## 2014-12-31 NOTE — Patient Instructions (Signed)

## 2014-12-31 NOTE — Progress Notes (Signed)
Quick Note:  Labs addressed at office as well as medications and patient was made aware. ______ 

## 2014-12-31 NOTE — Progress Notes (Signed)
ASSESSMENT: Pt currently experiencing symptoms of anxiety; needs to f/u with PCP. Pt would benefit from psychoeducation and supportive counseling to cope with symptoms of anxiety.  Stage of Change: precontemplative  PLAN: 1. F/U with behavioral health consultant in as needed 2. Psychiatric Medications: Paxil. 3. Behavioral recommendation(s):   -Consider self-care as important for wellbeing -Consider reading educational material regarding coping with symptoms of anxiety and depression -[facility] -Keep mobile crisis number on hand as needed  SUBJECTIVE: Pt. referred by Dr Venetia NightAmao for symptoms of anxiety and depression:  Pt. reports the following symptoms/concerns: Pt states that there is a lot of "family drama" in her household, and that this is causing her stress and anxiety; was going [facility], and has often been to the ED for pain. She states that she does not want to harm herself, but that she is just "stressed out, and I don't know what to do".  Duration of problem: undetermined number of years Severity: moderate  OBJECTIVE: Orientation & Cognition: Oriented x3. Thought processes normal and appropriate to situation. Mood: teary. Affect: appropriate Appearance: appropriate Risk of harm to self or others: no risk of harm to self or others Substance use: none stated Assessments administered: PHQ9: 16/ GAD7: 21  Diagnosis: Anxiety CPT Code: F41.9 -------------------------------------------- Other(s) present in the room: none  Time spent with patient in exam room: 20 minutes

## 2014-12-31 NOTE — Progress Notes (Signed)
Patient ID: Madie Renollen D Bugarin, female   DOB: 07-21-1960, 54 y.o.   MRN: [id number]   CC:  HPI: Lindell Sparllen Czerwonka is a 54 y.o. female with a history of anxiety, hypertension, COPD here today complaining of 2 day history of pelvic and abdominal pain which radiates around to the back which is a 5/10 and is now steady but was previously intermittent. She has a normal appetitie and is moving her bowels okay. Of note she has a ventral hernia and has not been able to afford the surgery for repair. Denies dysuria or urinary frequency or fever; also denies vomiting but does occasionally feel nauseated. Moved her bowels yesterday after using a laxative and said she did have some loose as well as small clumpy stool. She denies hematochezia.  Complains of also feeling depressed and anxious and takes Paxil which just makes her sleep. She has a lot going on at home and this stresses her out as well.  Allergies  Allergen Reactions  . Aspirin Other (See Comments)    REACTION: GI upset/Hernia   . Ciprofloxacin Other (See Comments)    Tendonitis, joint pain, and nausea.  "Interfered with her HCTZ"  . Codeine Other (See Comments)    REACTION: GI upset/Hernia  . Penicillins Rash   Past Medical History  Diagnosis Date  . Arthritis   . COPD (chronic obstructive pulmonary disease)   . Hypertension   . Generalized headaches   . Leg swelling   . Abdominal pain   . Anxiety   . Hernia, inguinal, right    Current Outpatient Prescriptions on File Prior to Visit  Medication Sig Dispense Refill  . cyclobenzaprine (FLEXERIL) 10 MG tablet Take 1 tablet (10 mg total) by mouth at bedtime. (Patient taking differently: Take 10 mg by mouth at bedtime as needed for muscle spasms. ) 30 tablet 0  . dicyclomine (BENTYL) 20 MG tablet Take 1 tablet (20 mg total) by mouth every 6 (six) hours as needed for spasms (abdominal cramping). 15 tablet 0  . hydrochlorothiazide (HYDRODIURIL) 25 MG tablet Take 1 tablet (25 mg total) by mouth  daily. 30 tablet 2  . ibuprofen (ADVIL,MOTRIN) 200 MG tablet Take 400 mg by mouth every 6 (six) hours as needed for moderate pain.    Marland Kitchen. ondansetron (ZOFRAN) 4 MG tablet Take 1 tablet (4 mg total) by mouth every 8 (eight) hours as needed for nausea or vomiting. 6 tablet 0  . PARoxetine (PAXIL) 20 MG tablet Take 20 mg by mouth daily.    . polyethylene glycol (MIRALAX) packet Take 17 g by mouth 2 (two) times daily. (Patient taking differently: Take 17 g by mouth daily as needed for mild constipation. ) 4 each 0  . VENTOLIN HFA 108 (90 BASE) MCG/ACT inhaler INHALE 2 PUFFS INTO THE LUNGS EVERY 6 HOURS AS NEEDED FOR WHEEZING OR SHORTNESS OF BREATH. (MAP) 54 each 3   No current facility-administered medications on file prior to visit.   Family History  Problem Relation Age of Onset  . Heart disease Mother 2856  . Heart failure Mother   . Asthma Mother   . Alzheimer's disease Father [id number]  . Colon cancer Neg Hx   . Emphysema Mother   . Emphysema Brother   . Asthma Brother    History   Social History  . Marital Status: Widowed    Spouse Name: N/A  . Number of Children: 3  . Years of Education: N/A   Occupational History  . disabled  Social History Main Topics  . Smoking status: Current Every Day Smoker -- 0.50 packs/day for 40 years    Types: Cigarettes  . Smokeless tobacco: Never Used  . Alcohol Use: No  . Drug Use: No  . Sexual Activity: Yes   Other Topics Concern  . Not on file   Social History Narrative    Review of Systems: Constitutional: Negative for fever, chills, diaphoresis, activity change, appetite change and fatigue. HENT: Negative for ear pain, nosebleeds, congestion, facial swelling, rhinorrhea, neck pain, neck stiffness and ear discharge.  Eyes: Negative for pain, discharge, redness, itching and visual disturbance. Respiratory: Negative for cough, choking, chest tightness, gets occasionally short of breath in the hot weather, negative for wheezing and stridor.    Cardiovascular: Negative for chest pain, palpitations and leg swelling. Gastrointestinal: Positive for hernia and abdominal pain. Genitourinary: Negative for dysuria, urgency, frequency, hematuria, flank pain, decreased urine volume, difficulty urinating and dyspareunia.  Musculoskeletal: positive for back pain, negative for joint swelling, arthralgias and gait problem. Neurological: Negative for dizziness, tremors, seizures, syncope, facial asymmetry, speech difficulty, weakness, light-headedness, numbness and headaches.  Hematological: Negative for adenopathy. Does not bruise/bleed easily. Psychiatric/Behavioral: Positive for depression and anxiety.     Objective:   Filed Vitals:   12/31/14 1455  BP: 142/90  Pulse: 61  Temp: 98.9 F (37.2 C)    Physical Exam: Constitutional: Patient appears well-developed and well-nourished. No distress. ist. . Neck: Normal ROM.  No JVD. No tracheal deviation. No thyromegaly. CVS: RRR, S1/S2 +, no murmurs, no gallops, no carotid bruit.  Pulmonary: Effort and breath sounds normal, no stridor, rhonchi, wheezes, rales.  Abdominal: Ventral hernia in the lower aspect of her abdomen, abdomen is diffusely tender to palpation, bowel sounds present.  Musculoskeletal: Normal range of motion. No edema and no tenderness, positive CVA tenderness bilaterally which is out of proportion to pressure and receded.  Lymphadenopathy: No lymphadenopathy noted, cervical, inguinal or axillary Neuro: Alert. Normal reflexes, muscle tone coordination. No cranial nerve deficit. Skin: Skin is warm and dry. No rash noted. Not diaphoretic. No erythema. No pallor. Psychiatric: Anxious  Lab Results  Component Value Date   WBC 11.8* 11/25/2014   HGB 15.6* 11/25/2014   HCT 46.3* 11/25/2014   MCV 85.7 11/25/2014   PLT 350 11/25/2014   Lab Results  Component Value Date   CREATININE 0.90 11/25/2014   BUN 10 11/25/2014   NA 142 11/25/2014   K 3.8 11/25/2014   CL 102  11/25/2014   CO2 28 11/25/2014    No results found for: HGBA1C Lipid Panel     Component Value Date/Time   CHOL 221* 05/09/2013 1445   TRIG 158* 05/09/2013 1445   HDL 47 05/09/2013 1445   CHOLHDL 4.7 05/09/2013 1445   VLDL 32 05/09/2013 1445   LDLCALC 142* 05/09/2013 1445       Assessment and plan:  54 year old female with a history of anxiety, hypertension, COPD, ventral hernia with pelvic and abdominal pain.   Abdominal pain: Symptoms are suspicious for constipation and so I am placing her on lactulose and have advised her to stop Bentyl. I am referring her for an abdominal x-ray to exclude obstruction and strangulation given the presence of ventral hernia. UA negative for UTI  Anxiety: PH Q9 score is 16 and GAD score is 21 Behavioral Health called in to see the patient She might be a candidate for BuSpar; will need to discuss this at her next visit.  Ventral hernia: Advised  she will need to be seen by General Surgery to have this fixed  This note has been created with Education officer, environmental. Any transcriptional errors are unintentional.      Jaclyn Shaggy, MD. [facility] [phone] 12/31/2014, 3:03 PM

## 2014-12-31 NOTE — Progress Notes (Signed)
Patient complains of lower abdominal and flank pain since yesterday She took 4 ibuprofen yesterday but has had nothing today for pain She rates pain 5/10 aching with the occasional spasm

## 2015-01-01 ENCOUNTER — Telehealth: Payer: Self-pay | Admitting: *Deleted

## 2015-01-01 NOTE — Telephone Encounter (Signed)
-----   Message from Jaclyn ShaggyEnobong Amao, MD sent at 12/31/2014 11:06 PM EDT ----- No acute abnormality to explain abdominal pain,possible mild hepatomegaly.

## 2015-01-01 NOTE — Telephone Encounter (Signed)
Verified name and date of birth and gave results.  Patient verbalized understanding. 

## 2015-01-15 ENCOUNTER — Other Ambulatory Visit: Payer: Self-pay | Admitting: Internal Medicine

## 2015-01-22 ENCOUNTER — Telehealth: Payer: Self-pay | Admitting: Internal Medicine

## 2015-01-22 NOTE — Telephone Encounter (Signed)
Patient called requesting medication refill for, hydrochlorothiazide (HYDRODIURIL) Please follow up.

## 2015-01-23 ENCOUNTER — Other Ambulatory Visit: Payer: Self-pay

## 2015-01-23 MED ORDER — HYDROCHLOROTHIAZIDE 25 MG PO TABS: 25.0000 mg | ORAL_TABLET | Freq: Every day | ORAL | Status: DC

## 2015-01-23 NOTE — Progress Notes (Unsigned)
Patient came into building requesting a refill on her HCTZ [facility]

## 2015-01-31 ENCOUNTER — Ambulatory Visit: Payer: No Typology Code available for payment source | Admitting: Pulmonary Disease

## 2015-01-31 ENCOUNTER — Encounter: Payer: Self-pay | Admitting: Pulmonary Disease

## 2015-01-31 ENCOUNTER — Ambulatory Visit (INDEPENDENT_AMBULATORY_CARE_PROVIDER_SITE_OTHER): Payer: No Typology Code available for payment source | Admitting: Pulmonary Disease

## 2015-01-31 VITALS — BP 124/72 | HR 74 | Ht 63.0 in | Wt 145.0 lb

## 2015-01-31 DIAGNOSIS — J438 Other emphysema: Secondary | ICD-10-CM

## 2015-01-31 DIAGNOSIS — F17200 Nicotine dependence, unspecified, uncomplicated: Secondary | ICD-10-CM

## 2015-01-31 DIAGNOSIS — Z720 Tobacco use: Secondary | ICD-10-CM

## 2015-01-31 NOTE — Assessment & Plan Note (Signed)
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 This can be a more difficult syndrome to treat and can represent a more severe phenotype. Clearly her ongoing tobacco use increases her risk for hospitalization and mortality/morbidity from her COPD.  Fortunately this has been a stable interval but she continues to have some shortness breath and ongoing mucus production. This is primarily due to her ongoing tobacco use.  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

## 2015-01-31 NOTE — Patient Instructions (Signed)
Quit smoking Stop using Stiolto, used [employer] a 2 puffs twice a day, let us know if it is helping so we can change your prescription Get a egg free flu shot in the fall We will see you back in 6 month or sooner if needed

## 2015-01-31 NOTE — Progress Notes (Signed)
Subjective:    Patient ID: Maria Oliver, female    DOB: 03-09-1961, 54 y.o.   MRN: [id number]   Synopsis: Former patient of Dr. Shelle Iron who has COPD.  She was referred to [facility] pulmonary in 2016 after having pneumonia. CXR 06/2014: hyperinflation, prominent BV markings, no acute process PFT's 07/2014:  FEV1 1.63 (63%), ratio 66, 19% increase with BD, +airtrapping, normal TLC, DLCO 54% Still smoking as of August 2016.  HPI Chief Complaint  Patient presents with  . Follow-up    former [facility] pt following up for emphysema.  pt c/o increased DOE in heat.  pt's a/c in home is out making her breathing worse.     [name] was diagnosed with COPD this year. She continues to produce mucus daily, it is foamy and thick.  Better than when she had pneumonia earlier this year. She has been taking Stiolto since Dr. Shelle Iron saw her last.  She says that proventil works better when she takes it. She says that her mother and brother had chronic, severe asthma.  She says her mother spent a lot of time in [place] in [place] as an adult. She has been struggling with the hot weather recently, no air conditioning at home. Still smoking 1/2 pack per day.  She is trying behavioral measures, but struggling.   Has struggled with depression, recently had Paxil dose increased.   She can't take the flu shot due to an allergy medicine.     Past Medical History  Diagnosis Date  . Arthritis   . COPD (chronic obstructive pulmonary disease)   . Hypertension   . Generalized headaches   . Leg swelling   . Abdominal pain   . Anxiety   . Hernia, inguinal, right       Review of Systems  Constitutional: Negative for fever, chills and fatigue.  HENT: Negative for postnasal drip, rhinorrhea and sinus pressure.   Respiratory: Positive for cough and shortness of breath. Negative for wheezing.   Cardiovascular: Negative for chest pain, palpitations and leg swelling.       Objective:   Physical Exam Filed Vitals:     01/31/15 1001  BP: 124/72  Pulse: 74  Height:  (1.6 m)  Weight: 145 lb (65.772 kg)  SpO2: 97%   room air  Gen: well appearing HENT: OP clear, neck supple PULM: CTA B, normal percussion CV: RRR, no mgr, trace edema GI: BS+, soft, nontender Derm: no cyanosis or rash Psyche: normal mood and affect   June 2016 chest x-ray images personally reviewed showing emphysema, questionable atelectasis in the bases but no other pulmonary parenchymal abnormality     Assessment & Plan:  COPD (chronic obstructive pulmonary disease) with emphysema She has moderate airflow obstruction with positive bronchial reactivity which 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 This can be a more difficult syndrome to treat and can represent a more severe phenotype. Clearly her ongoing tobacco use increases her risk for hospitalization and mortality/morbidity from her COPD.  Fortunately this has been a stable interval but she continues to have some shortness breath and ongoing mucus production. This is primarily due to her ongoing tobacco use.  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 She has  depression so she is reluctant to use Chantix. She has not had benefit from nicotine replacement in the past. I advised her to talk to her primary care physician about adding Wellbutrin to her anti-depression regimen. She was counseled at length today to quit smoking.     Current outpatient prescriptions:  .  cyclobenzaprine (FLEXERIL) 10 MG tablet, Take 1 tablet (10 mg total) by mouth at bedtime. (Patient taking differently: Take 10 mg by mouth at bedtime as needed for muscle spasms. ), Disp: 30 tablet, Rfl: 0 .  dicyclomine (BENTYL) 20 MG tablet, Take 1  tablet (20 mg total) by mouth every 6 (six) hours as needed for spasms (abdominal cramping)., Disp: 15 tablet, Rfl: 0 .  hydrochlorothiazide (HYDRODIURIL) 25 MG tablet, Take 1 tablet (25 mg total) by mouth daily., Disp: 30 tablet, Rfl: 2 .  ibuprofen (ADVIL,MOTRIN) 200 MG tablet, Take 400 mg by mouth every 6 (six) hours as needed for moderate pain., Disp: , Rfl:  .  lactulose (CHRONULAC) 10 GM/15ML solution, Take 15 mLs (10 g total) by mouth 3 (three) times daily., Disp: 946 mL, Rfl: 0 .  ondansetron (ZOFRAN) 4 MG tablet, Take 1 tablet (4 mg total) by mouth every 8 (eight) hours as needed for nausea or vomiting., Disp: 6 tablet, Rfl: 0 .  PARoxetine (PAXIL) 20 MG tablet, Take 20 mg by mouth daily., Disp: , Rfl:  .  polyethylene glycol (MIRALAX) packet, Take 17 g by mouth 2 (two) times daily. (Patient taking differently: Take 17 g by mouth daily as needed for mild constipation. ), Disp: 4 each, Rfl: 0 .  Tiotropium Bromide-Olodaterol (STIOLTO RESPIMAT) 2.5-2.5 MCG/ACT AERS, Inhale 2 puffs into the lungs daily., Disp: , Rfl:  .  VENTOLIN HFA 108 (90 BASE) MCG/ACT inhaler, INHALE 2 PUFFS INTO THE LUNGS EVERY 6 HOURS AS NEEDED FOR WHEEZING OR SHORTNESS OF BREATH. (MAP), Disp: 54 each, Rfl: 3

## 2015-01-31 NOTE — Assessment & Plan Note (Signed)
She continues to smoke on a daily basis which is contributing to her symptoms. She has depression so she is reluctant to use Chantix. She has not had benefit from nicotine replacement in the past. I advised her to talk to her primary care physician about adding Wellbutrin to her anti-depression regimen. She was counseled at length today to quit smoking.

## 2015-02-06 ENCOUNTER — Ambulatory Visit: Payer: Medicaid Other | Attending: Family Medicine | Admitting: Family Medicine

## 2015-02-06 ENCOUNTER — Encounter: Payer: Self-pay | Admitting: Family Medicine

## 2015-02-06 VITALS — BP 114/69 | HR 72 | Temp 98.6°F | Resp 18 | Ht 64.0 in | Wt 146.0 lb

## 2015-02-06 DIAGNOSIS — M25569 Pain in unspecified knee: Secondary | ICD-10-CM

## 2015-02-06 DIAGNOSIS — J438 Other emphysema: Secondary | ICD-10-CM

## 2015-02-06 DIAGNOSIS — G8929 Other chronic pain: Secondary | ICD-10-CM | POA: Diagnosis not present

## 2015-02-06 DIAGNOSIS — F17210 Nicotine dependence, cigarettes, uncomplicated: Secondary | ICD-10-CM | POA: Diagnosis not present

## 2015-02-06 DIAGNOSIS — I10 Essential (primary) hypertension: Secondary | ICD-10-CM | POA: Diagnosis not present

## 2015-02-06 DIAGNOSIS — M25561 Pain in right knee: Secondary | ICD-10-CM | POA: Insufficient documentation

## 2015-02-06 DIAGNOSIS — F419 Anxiety disorder, unspecified: Secondary | ICD-10-CM | POA: Diagnosis not present

## 2015-02-06 DIAGNOSIS — E559 Vitamin D deficiency, unspecified: Secondary | ICD-10-CM

## 2015-02-06 DIAGNOSIS — M25562 Pain in left knee: Secondary | ICD-10-CM | POA: Insufficient documentation

## 2015-02-06 DIAGNOSIS — K439 Ventral hernia without obstruction or gangrene: Secondary | ICD-10-CM

## 2015-02-06 MED ORDER — DICLOFENAC SODIUM 1 % TD GEL: 2.0000 g | Freq: Four times a day (QID) | TRANSDERMAL | Status: DC

## 2015-02-06 MED ORDER — IBUPROFEN 600 MG PO TABS: 600.0000 mg | ORAL_TABLET | Freq: Three times a day (TID) | ORAL | Status: DC | PRN

## 2015-02-06 MED ORDER — PAROXETINE HCL 20 MG PO TABS: 40.0000 mg | ORAL_TABLET | ORAL | Status: DC

## 2015-02-06 MED ORDER — MOMETASONE FURO-FORMOTEROL FUM 100-5 MCG/ACT IN AERO: 2.0000 | INHALATION_SPRAY | Freq: Two times a day (BID) | RESPIRATORY_TRACT | Status: DC

## 2015-02-06 MED ORDER — HYDROCHLOROTHIAZIDE 25 MG PO TABS: 25.0000 mg | ORAL_TABLET | Freq: Every day | ORAL | Status: DC

## 2015-02-06 NOTE — Patient Instructions (Signed)
Maria Oliver,  Thank you for coming in today. It was a pleasure meeting you. I look forward to being your primary doctor.  1. HTN:  BP at goal Continue HCTZ 25 mg daily   2. Fatigue: most likely related to COPD/asthma Continue dulera trial Albuterol as needed   3. History of vit D insuff: vit D check today  4. Knee pain: suspect osteoarthritis ibuprofen 600 mg 3 times a day as needed with food and voltaren gel  5. Anxiety: paxil ready. It will be two, 20 mg tablets daily. It will be free today  F/u in 3-4 weeks with RN for flu shot F/u with me in 2 months for HTN and knee pain   Dr. Armen Pickup

## 2015-02-06 NOTE — Progress Notes (Signed)
F/U abdominal pain, no pain now Medicine refills  Hx tobacco 1/2 ppday

## 2015-02-06 NOTE — Assessment & Plan Note (Signed)
HTN:  BP at goal Continue HCTZ 25 mg daily

## 2015-02-06 NOTE — Assessment & Plan Note (Signed)
Anxiety:  Treated at [facility] Patient to start, paxil  20 mg tablets, two tabs once daily

## 2015-02-06 NOTE — Progress Notes (Signed)
   Subjective:    Patient ID: Maria Oliver, female    DOB: [birth year], 54 y.o.   MRN: [id number] CC: f/u HTN, knee pain, med refills  HPI 54 yo F presents for f/u visit   1. CHRONIC HYPERTENSION  Disease Monitoring  Blood pressure range: not checking   Chest pain: no   Dyspnea: no   Claudication: no   Medication compliance: yes  Medication Side Effects  Lightheadedness: no   Urinary frequency: no   Edema: no     2. B/l knee pain: chronic pain. Aches in knees. Daily. Moderate pain. With joint stiffness. No joint swelling. No recent trauma.   3. Anxiety: treated at [facility]. paxil dose has been increased to 40 mg daily. Patient has not started 40 mg dose yet.   Social History  Substance Use Topics  . Smoking status: Current Every Day Smoker -- 0.50 packs/day for 40 years    Types: Cigarettes  . Smokeless tobacco: Never Used  . Alcohol Use: No   Review of Systems  Constitutional: Negative for fever and chills.  Eyes: Negative for visual disturbance.  Respiratory: Negative for shortness of breath.   Cardiovascular: Negative for chest pain.  Gastrointestinal: Negative for abdominal pain and blood in stool.  Musculoskeletal: Positive for arthralgias. Negative for joint swelling.  Skin: Negative for rash.  Allergic/Immunologic: Negative for immunocompromised state.  Hematological: Negative for adenopathy. Does not bruise/bleed easily.  Psychiatric/Behavioral: Positive for dysphoric mood. Negative for suicidal ideas.      Objective:   Physical Exam  Constitutional: She is oriented to person, place, and time. She appears well-developed and well-nourished. No distress.  HENT:  Head: Normocephalic and atraumatic.  Cardiovascular: Normal rate, regular rhythm, normal heart sounds and intact distal pulses.   Pulmonary/Chest: Effort normal and breath sounds normal.  Abdominal: She exhibits mass (RL ventral hernia ).  Musculoskeletal: She exhibits no edema or tenderness.    Neurological: She is alert and oriented to person, place, and time.  Skin: Skin is warm and dry. No rash noted.  Psychiatric: She has a normal mood and affect.  BP 114/69 mmHg  Pulse 72  Temp(Src) 98.6 F (37 C) (Oral)  Resp 18  Ht  (1.626 m)  Wt 146 lb (66.225 kg)  BMI 25.05 kg/m2  SpO2 96%  Depression screen [facility] 2/9 12/31/2014 08/20/2013  Decreased Interest 2 0  Down, Depressed, Hopeless 3 0  PHQ - 2 Score 5 0  Altered sleeping 3 -  Tired, decreased energy 3 -  Change in appetite 0 -  Feeling bad or failure about yourself  3 -  Trouble concentrating 2 -  Moving slowly or fidgety/restless 0 -  Suicidal thoughts (No Data) -  PHQ-9 Score 16 -       Assessment & Plan:

## 2015-02-06 NOTE — Assessment & Plan Note (Signed)
A: hernia with intermittent pain, no pain now P: Monitor

## 2015-02-06 NOTE — Assessment & Plan Note (Signed)
A; hx of vit D insufficieny, no supplement P: vit D recheck today

## 2015-02-07 LAB — VITAMIN D 25 HYDROXY (VIT D DEFICIENCY, FRACTURES): Vit D, 25-Hydroxy: 22 ng/mL — ABNORMAL LOW (ref 30–100)

## 2015-02-10 MED ORDER — VITAMIN D3 50 MCG (2000 UT) PO TABS: 2000.0000 [IU] | ORAL_TABLET | Freq: Every day | ORAL | Status: DC

## 2015-02-10 NOTE — Addendum Note (Signed)
Addended by: Dessa Phi on: 02/10/2015 01:49 PM   Modules accepted: Orders

## 2015-02-26 ENCOUNTER — Telehealth: Payer: Self-pay | Admitting: *Deleted

## 2015-02-26 NOTE — Telephone Encounter (Signed)
-----   Message from Dessa Phi, MD sent at 02/10/2015  1:49 PM EDT ----- Vit D insufficiency Take 2000 IU of D3 daily

## 2015-02-26 NOTE — Telephone Encounter (Signed)
Date of birth verified by pt Notified Vit D insufficiency advised to buy OTC Vit D 2000 unit daily

## 2015-03-04 ENCOUNTER — Telehealth: Payer: Self-pay | Admitting: Pulmonary Disease

## 2015-03-04 DIAGNOSIS — J438 Other emphysema: Secondary | ICD-10-CM

## 2015-03-04 NOTE — Telephone Encounter (Signed)
Pt states that BQ made mention to her about a program to possibly help her to get A/C in home as she lives in an older trailer without A/C and hard to breath.

## 2015-03-04 NOTE — Telephone Encounter (Signed)
Patient Instructions     Quit smoking Stop using Stiolto, used Dulera a 2 puffs twice a day, let us know if it is helping so we can change your prescription Get a egg free flu shot in the fall We will see you back in 6 month or sooner if needed   Pt states that Maria Oliver is working about the same as the [employer]. Are you okay with patient getting Rx.

## 2015-03-05 MED ORDER — MOMETASONE FURO-FORMOTEROL FUM 100-5 MCG/ACT IN AERO: 2.0000 | INHALATION_SPRAY | Freq: Two times a day (BID) | RESPIRATORY_TRACT | Status: DC

## 2015-03-05 NOTE — Telephone Encounter (Signed)
lmtcb x1--we are still awaiting response from Dr. Kendrick Fries.  Please advise Dr. Kendrick Fries on pt response below. thanks

## 2015-03-05 NOTE — Telephone Encounter (Signed)
[facility] COPD referral; please order

## 2015-03-05 NOTE — Telephone Encounter (Signed)
[phone]. Pt cb

## 2015-03-05 NOTE — Telephone Encounter (Signed)
Spoke with pt. Aware referral placed. Nothing further needed

## 2015-03-05 NOTE — Telephone Encounter (Signed)
Yes that is OK to stay on dulera

## 2015-03-05 NOTE — Telephone Encounter (Signed)
Pt aware RX sent in. Nothing further needed 

## 2015-03-05 NOTE — Telephone Encounter (Signed)
Received a message from [facility]:  Good Afternoon,        Thank you for this consult.  This patient is Not eligible for [facility] Care Management Services.     Reason: Not a beneficiary currently attributed to one of the [facility] ACO Registry populations. Membership roster used to verify non- eligible status.        Again Thank you for this consult.        Thanks,    Corrie Mckusick. Sharlee Blew    [facility] Care Management    [facility] CM Assistant    Phone: [phone]    Fax: [phone]    Please advise Dr. Kendrick Fries thanks

## 2015-03-05 NOTE — Telephone Encounter (Signed)
Called spoke with pt. She reports she would prefer to stay on dulera for now and not stiolto. Is this okay? thanks

## 2015-03-05 NOTE — Telephone Encounter (Signed)
Yes, OK to change to [employer]

## 2015-04-28 ENCOUNTER — Telehealth: Payer: Self-pay | Admitting: Pulmonary Disease

## 2015-04-28 MED ORDER — LEVOFLOXACIN 500 MG PO TABS: 500.0000 mg | ORAL_TABLET | Freq: Every day | ORAL | Status: DC

## 2015-04-28 MED ORDER — PREDNISONE 10 MG PO TABS
ORAL_TABLET | ORAL | Status: DC
Start: 2015-04-28 — End: 2015-05-15

## 2015-04-28 NOTE — Telephone Encounter (Addendum)
lmtcb for pt.   Message needs to marked urgent when routed back to triage.

## 2015-04-28 NOTE — Telephone Encounter (Signed)
Patient says that Dr. Shelle Ironlance mentioned a program that can get her air fixed at her home.  Not sure what program she is referring too, patient wants to be enrolled in this program because her air is broken and she needs it repaired.  Patient notified of Dr. Shirlee MoreMannam's recommendations.  Rx sent to pharmacy.    TP - please advise regarding above.. Thanks.

## 2015-04-28 NOTE — Telephone Encounter (Signed)
Pt returned call. Pt c/o an increase in SOB, prod cough with yellow mucus, chest tightness, and wheezing x 4 days. Pt denies f/c/s.  Pt also requesting an appt with BQ for disability forms. Pt is unsatisfied that BQ is not in the office every day for a sooner appt that 06/2015. Advised pt that if she would like to change providers then we would have to check with BQ then again with the new provider. Pt became frustrated and stated she was unsure then disconnected the line. Will send to doc of day since Dr. Kendrick FriesMcQuaid is unavailable.   Dr. Isaiah SergeMannam please advise on pt's s/s. Thanks.

## 2015-04-28 NOTE — Telephone Encounter (Signed)
Levaquin 500 mg qd for 7 days. Pred taper starting at 60 mg. Reduce by 10 mg every 3 days 

## 2015-04-29 NOTE — Telephone Encounter (Signed)
I am not aware of any program like this  Is she talking about air conditioning or vent cleaning  Please contact office for sooner follow up if symptoms do not improve or worsen or seek emergency care

## 2015-04-29 NOTE — Telephone Encounter (Signed)
Pt is aware that TP does not know program she is referring to. Nothing further was needed.

## 2015-05-15 ENCOUNTER — Ambulatory Visit (INDEPENDENT_AMBULATORY_CARE_PROVIDER_SITE_OTHER): Payer: No Typology Code available for payment source | Admitting: Adult Health

## 2015-05-15 ENCOUNTER — Encounter: Payer: Self-pay | Admitting: Adult Health

## 2015-05-15 VITALS — BP 122/84 | HR 59 | Temp 97.8°F | Ht 64.0 in | Wt 148.0 lb

## 2015-05-15 DIAGNOSIS — J439 Emphysema, unspecified: Secondary | ICD-10-CM

## 2015-05-15 MED ORDER — TIOTROPIUM BROMIDE MONOHYDRATE 18 MCG IN CAPS: 18.0000 ug | ORAL_CAPSULE | Freq: Every day | RESPIRATORY_TRACT | Status: DC

## 2015-05-15 NOTE — Assessment & Plan Note (Signed)
Recent flare now resolved.  Smoking is biggest barrier at this point  Will try to add spiriva to help with sx control   Plan  Continue on Dulera 2 puffs Twice daily  , rinse after use.  Begin Spiriva Handihaler 1 puff daily .  Work on not smoking .  follow up Dr. Kendrick FriesMcQuaid in 6 months and As needed

## 2015-05-15 NOTE — Progress Notes (Signed)
Subjective:    Patient ID: Maria Oliver, female    DOB: 1961-03-21, 54 y.o.   MRN: [id number]  HPI 54 year old female with known COPD with asthmatic component, smoker  Test :  PFTs February 2016 showed FEV1 at 1.63, 63% of predicted, ratio 66, positive bronchodilator response, 90% increase, positive air trapping, DLCO 54%.  05/15/2015 follow-up COPD Returns for three-month follow-up. Patient says that she has intermittent cough and congestion most days. She coughs up a white mucus. Patient had a recent bronchitis with thick yellow mucus. She was called in Levaquin and a prednisone taper. Patient says that she is improved since taking antibiotics. Patient is still smoking. Has cut back to one half pack daily. We discussed smoking cessation. Patient declines flu shot. She denies any hemoptysis, chest pain, orthopnea, PND or leg swelling. Says that she gets winded with activity. [facility]  Has not seen much difference in breathing.    Past Medical History  Diagnosis Date  . Arthritis   . COPD (chronic obstructive pulmonary disease) (HCC)   . Hypertension   . Generalized headaches   . Leg swelling   . Abdominal pain   . Anxiety   . Hernia, inguinal, right    Current Outpatient Prescriptions on File Prior to Visit  Medication Sig Dispense Refill  . cyclobenzaprine (FLEXERIL) 10 MG tablet Take 1 tablet (10 mg total) by mouth at bedtime. 30 tablet 0  . diclofenac sodium (VOLTAREN) 1 % GEL Apply 2 g topically 4 (four) times daily. 100 g 3  . dicyclomine (BENTYL) 20 MG tablet Take 1 tablet (20 mg total) by mouth every 6 (six) hours as needed for spasms (abdominal cramping). 15 tablet 0  . hydrochlorothiazide (HYDRODIURIL) 25 MG tablet Take 1 tablet (25 mg total) by mouth daily. 30 tablet 5  . ibuprofen (ADVIL,MOTRIN) 600 MG tablet Take 1 tablet (600 mg total) by mouth every 8 (eight) hours as needed. 30 tablet 0  . mometasone-formoterol (DULERA)  100-5 MCG/ACT AERO Inhale 2 puffs into the lungs 2 (two) times daily. 1 Inhaler 3  . ondansetron (ZOFRAN) 4 MG tablet Take 1 tablet (4 mg total) by mouth every 8 (eight) hours as needed for nausea or vomiting. 6 tablet 0  . VENTOLIN HFA 108 (90 BASE) MCG/ACT inhaler INHALE 2 PUFFS INTO THE LUNGS EVERY 6 HOURS AS NEEDED FOR WHEEZING OR SHORTNESS OF BREATH. (MAP) 54 each 3  . Cholecalciferol (VITAMIN D3) 2000 UNITS TABS Take 2,000 Units by mouth daily. (Patient not taking: Reported on 05/15/2015) 30 tablet 11  . PARoxetine (PAXIL) 20 MG tablet Take 2 tablets (40 mg total) by mouth every morning. 60 tablet 5   No current facility-administered medications on file prior to visit.     Review of Systems    Constitutional:   No  weight loss, night sweats,  Fevers, chills,  +fatigue, or  lassitude.  HEENT:   No headaches,  Difficulty swallowing,  Tooth/dental problems, or  Sore throat,                No sneezing, itching, ear ache, nasal congestion, post nasal drip,   CV:  No chest pain,  Orthopnea, PND, swelling in lower extremities, anasarca, dizziness, palpitations, syncope.   GI  No heartburn, indigestion, abdominal pain, nausea, vomiting, diarrhea, change in bowel habits, loss of appetite, bloody stools.   Resp:    No chest wall deformity  Skin: no rash or lesions.  GU:  no dysuria, change in color of urine, no urgency or frequency.  No flank pain, no hematuria   MS:  No joint pain or swelling.  No decreased range of motion.  No back pain.  Psych:  No change in mood or affect. No depression or anxiety.  No memory loss.      Objective:   Physical Exam GEN: A/Ox3; pleasant , NAD, smells strong of smoke   HEENT:  [place]/AT,  EACs-clear, TMs-wnl, NOSE-clear, THROAT-clear, no lesions, no postnasal drip or exudate noted.   NECK:  Supple w/ fair ROM; no JVD; normal carotid impulses w/o bruits; no thyromegaly or nodules palpated; no lymphadenopathy.  RESP  Decreased BS in bases ,  w/o,  wheezes/ rales/ or rhonchi.no accessory muscle use, no dullness to percussion  CARD:  RRR, no m/r/g  , no peripheral edema, pulses intact, no cyanosis or clubbing.  GI:   Soft & nt; nml bowel sounds; no organomegaly or masses detected.  Musco: Warm bil, no deformities or joint swelling noted.   Neuro: alert, no focal deficits noted.    Skin: Warm, no lesions or rashes         Assessment & Plan:

## 2015-05-15 NOTE — Patient Instructions (Signed)
Continue on Dulera 2 puffs Twice daily  , rinse after use.  Begin Spiriva Handihaler 1 puff daily .  Work on not smoking .  follow up Dr. Kendrick FriesMcQuaid in 6 months and As needed

## 2015-05-16 ENCOUNTER — Other Ambulatory Visit: Payer: Self-pay | Admitting: Internal Medicine

## 2015-05-19 ENCOUNTER — Other Ambulatory Visit: Payer: Self-pay

## 2015-05-19 NOTE — Progress Notes (Signed)
Reviewed, I agree with this plan

## 2015-05-21 ENCOUNTER — Other Ambulatory Visit: Payer: Self-pay | Admitting: Family Medicine

## 2015-05-21 DIAGNOSIS — J439 Emphysema, unspecified: Secondary | ICD-10-CM

## 2015-05-21 MED ORDER — ALBUTEROL SULFATE HFA 108 (90 BASE) MCG/ACT IN AERS: 2.0000 | INHALATION_SPRAY | Freq: Four times a day (QID) | RESPIRATORY_TRACT | Status: DC | PRN

## 2015-05-22 ENCOUNTER — Encounter: Payer: Self-pay | Admitting: Family Medicine

## 2015-05-22 ENCOUNTER — Ambulatory Visit: Payer: Medicaid Other | Attending: Family Medicine | Admitting: Family Medicine

## 2015-05-22 VITALS — BP 112/68 | HR 64 | Temp 98.6°F | Resp 16 | Ht 64.0 in | Wt 147.0 lb

## 2015-05-22 DIAGNOSIS — G894 Chronic pain syndrome: Secondary | ICD-10-CM | POA: Diagnosis not present

## 2015-05-22 DIAGNOSIS — R1032 Left lower quadrant pain: Secondary | ICD-10-CM | POA: Insufficient documentation

## 2015-05-22 DIAGNOSIS — F419 Anxiety disorder, unspecified: Secondary | ICD-10-CM

## 2015-05-22 DIAGNOSIS — R109 Unspecified abdominal pain: Secondary | ICD-10-CM | POA: Diagnosis not present

## 2015-05-22 DIAGNOSIS — M546 Pain in thoracic spine: Secondary | ICD-10-CM | POA: Diagnosis not present

## 2015-05-22 DIAGNOSIS — I10 Essential (primary) hypertension: Secondary | ICD-10-CM

## 2015-05-22 DIAGNOSIS — G8929 Other chronic pain: Secondary | ICD-10-CM | POA: Diagnosis not present

## 2015-05-22 DIAGNOSIS — K439 Ventral hernia without obstruction or gangrene: Secondary | ICD-10-CM

## 2015-05-22 DIAGNOSIS — Z79899 Other long term (current) drug therapy: Secondary | ICD-10-CM | POA: Insufficient documentation

## 2015-05-22 DIAGNOSIS — F17210 Nicotine dependence, cigarettes, uncomplicated: Secondary | ICD-10-CM | POA: Diagnosis not present

## 2015-05-22 DIAGNOSIS — R350 Frequency of micturition: Secondary | ICD-10-CM

## 2015-05-22 DIAGNOSIS — M549 Dorsalgia, unspecified: Secondary | ICD-10-CM

## 2015-05-22 LAB — POCT URINALYSIS DIPSTICK
BILIRUBIN UA: NEGATIVE
Blood, UA: NEGATIVE
GLUCOSE UA: NEGATIVE
KETONES UA: NEGATIVE
Leukocytes, UA: NEGATIVE
Nitrite, UA: NEGATIVE
Protein, UA: NEGATIVE
SPEC GRAV UA: 1.02
Urobilinogen, UA: 0.2
pH, UA: 7

## 2015-05-22 MED ORDER — CYCLOBENZAPRINE HCL 10 MG PO TABS: 10.0000 mg | ORAL_TABLET | Freq: Every day | ORAL | Status: DC

## 2015-05-22 MED ORDER — DICYCLOMINE HCL 20 MG PO TABS
20.0000 mg | ORAL_TABLET | Freq: Four times a day (QID) | ORAL | Status: DC | PRN
Start: 2015-05-22 — End: 2016-03-19

## 2015-05-22 MED ORDER — HYDROCHLOROTHIAZIDE 25 MG PO TABS: 25.0000 mg | ORAL_TABLET | Freq: Every day | ORAL | Status: DC

## 2015-05-22 MED ORDER — PAROXETINE HCL 20 MG PO TABS: 40.0000 mg | ORAL_TABLET | ORAL | Status: DC

## 2015-05-22 MED ORDER — ONDANSETRON HCL 4 MG PO TABS: 4.0000 mg | ORAL_TABLET | Freq: Three times a day (TID) | ORAL | Status: DC | PRN

## 2015-05-22 NOTE — Assessment & Plan Note (Signed)
A: Blood pressure at goal. Meds: compliant  P: I will continue the patient's current medication regimen since her blood pressure is at goal.   

## 2015-05-22 NOTE — Patient Instructions (Signed)
[name] was seen today for hypertension.  Diagnoses and all orders for this visit:  Essential hypertension -     hydrochlorothiazide (HYDRODIURIL) 25 MG tablet; Take 1 tablet (25 mg total) by mouth daily.  Anxiety -     PARoxetine (PAXIL) 20 MG tablet; Take 2 tablets (40 mg total) by mouth every morning.  Dysuria -     POCT urinalysis dipstick  Upper back pain  Ventral hernia without obstruction or gangrene  Chronic abdominal pain -     cyclobenzaprine (FLEXERIL) 10 MG tablet; Take 1 tablet (10 mg total) by mouth at bedtime. -     dicyclomine (BENTYL) 20 MG tablet; Take 1 tablet (20 mg total) by mouth every 6 (six) hours as needed for spasms (abdominal cramping). -     ondansetron (ZOFRAN) 4 MG tablet; Take 1 tablet (4 mg total) by mouth every 8 (eight) hours as needed for nausea or vomiting.  Chronic pain syndrome -     cyclobenzaprine (FLEXERIL) 10 MG tablet; Take 1 tablet (10 mg total) by mouth at bedtime.   UA is negative, there is no UTI Drink plenty of water to reduce urine odor.  meds refilled and form completed  F/u in 3 months  Dr. Armen PickupFunches

## 2015-05-22 NOTE — Progress Notes (Signed)
F/U medication refills  C/C Frequency urination. Neck and shoulder pain  Pain scale #6 Tobacco user -1/2ppday No suicidal thought in the past two weeks

## 2015-05-22 NOTE — Assessment & Plan Note (Signed)
A; chronic MSK pain in diffuse locations. Patient has COPD and anxiety P: Continue prn nightly flexeril Continue treatment of potentially exacerbating medical conditions

## 2015-05-22 NOTE — Progress Notes (Signed)
Patient ID: Maria Oliver, female   DOB: 05-13-1961, 54 y.o.   MRN: [id number]   Subjective:  Patient ID: Maria Oliver, female    DOB: 02/02/1961  Age: 54 y.o. MRN: [id number]  CC: Hypertension   HPI RITAL CAVEY presents for    1. CHRONIC HYPERTENSION  Disease Monitoring  Blood pressure range: not checking   Chest pain: no   Dyspnea: no   Claudication: no   Medication compliance: yes  Medication Side Effects  Lightheadedness: no   Urinary frequency: yes   Edema: no     2. Urinary frequency: x 2 weeks. With increased urine odor. No dysuria or hematuria. There is LLQ pain. She has hx of chronic abdominal pain.  She has known ventral hernia. She has hx of stress incontinence. She was evaluated in past by genecology for pessary or bladder sling, she is not sure of which. She declined it at the time. She in on HCTZ. She reports this frequency is different. She would like to stay on HCTZ.  3. Form: she has form from disability services. She needs to list all meds, indication, frequency and date first prescribed. She has some but not all of this information. She is requesting help completing the form. She has 3 providers: me for primary care, Dr. Kendrick Fries for pulmonology, Vesta Mixer for mental health.    Social History  Substance Use Topics  . Smoking status: Current Every Day Smoker -- 0.50 packs/day for 40 years    Types: Cigarettes  . Smokeless tobacco: Never Used  . Alcohol Use: No   Outpatient Prescriptions Prior to Visit  Medication Sig Dispense Refill  . albuterol (VENTOLIN HFA) 108 (90 BASE) MCG/ACT inhaler Inhale 2 puffs into the lungs every 6 (six) hours as needed for wheezing or shortness of breath. 54 each 3  . Cholecalciferol (VITAMIN D3) 2000 UNITS TABS Take 2,000 Units by mouth daily. (Patient not taking: Reported on 05/15/2015) 30 tablet 11  . cyclobenzaprine (FLEXERIL) 10 MG tablet Take 1 tablet (10 mg total) by mouth at bedtime. 30 tablet 0  . diclofenac sodium  (VOLTAREN) 1 % GEL Apply 2 g topically 4 (four) times daily. 100 g 3  . dicyclomine (BENTYL) 20 MG tablet Take 1 tablet (20 mg total) by mouth every 6 (six) hours as needed for spasms (abdominal cramping). 15 tablet 0  . hydrochlorothiazide (HYDRODIURIL) 25 MG tablet Take 1 tablet (25 mg total) by mouth daily. 30 tablet 5  . ibuprofen (ADVIL,MOTRIN) 600 MG tablet Take 1 tablet (600 mg total) by mouth every 8 (eight) hours as needed. 30 tablet 0  . mometasone-formoterol (DULERA) 100-5 MCG/ACT AERO Inhale 2 puffs into the lungs 2 (two) times daily. 1 Inhaler 3  . Multiple Vitamin (MULTIVITAMIN) tablet Take 1 tablet by mouth daily.    . ondansetron (ZOFRAN) 4 MG tablet Take 1 tablet (4 mg total) by mouth every 8 (eight) hours as needed for nausea or vomiting. 6 tablet 0  . PARoxetine (PAXIL) 20 MG tablet Take 2 tablets (40 mg total) by mouth every morning. 60 tablet 5  . tiotropium (SPIRIVA HANDIHALER) 18 MCG inhalation capsule Place 1 capsule (18 mcg total) into inhaler and inhale daily. 30 capsule 5   No facility-administered medications prior to visit.    ROS Review of Systems  Constitutional: Negative for fever and chills.  Eyes: Negative for visual disturbance.  Respiratory: Negative for shortness of breath.   Cardiovascular: Negative for chest pain.  Gastrointestinal: Positive for abdominal  pain. Negative for blood in stool.  Genitourinary: Positive for frequency. Negative for dysuria.  Musculoskeletal: Positive for myalgias and neck pain. Negative for back pain and arthralgias.  Skin: Negative for rash.  Allergic/Immunologic: Negative for immunocompromised state.  Hematological: Negative for adenopathy. Does not bruise/bleed easily.  Psychiatric/Behavioral: Negative for suicidal ideas and dysphoric mood.    Objective:  BP 112/68 mmHg  Pulse 64  Temp(Src) 98.6 F (37 C) (Oral)  Resp 16  Ht 5\' 4"  (1.626 m)  Wt 147 lb (66.679 kg)  BMI 25.22 kg/m2  SpO2 95%  BP/Weight 05/22/2015  05/15/2015 02/06/2015  Systolic BP 112 122 114  Diastolic BP 68 84 69  Wt. (Lbs) 147 148 146  BMI 25.22 25.39 25.05   Physical Exam  Constitutional: She is oriented to person, place, and time. She appears well-developed and well-nourished. No distress.  HENT:  Head: Normocephalic and atraumatic.  Cardiovascular: Normal rate, regular rhythm, normal heart sounds and intact distal pulses.   Pulmonary/Chest: Effort normal and breath sounds normal.  Musculoskeletal: She exhibits no edema.  Neurological: She is alert and oriented to person, place, and time.  Skin: Skin is warm and dry. No rash noted.  Psychiatric: She has a normal mood and affect.   UA: negative   Assessment & Plan:   Problem List Items Addressed This Visit    Anxiety (Chronic)   Relevant Medications   hydrOXYzine (ATARAX/VISTARIL) 10 MG tablet   PARoxetine (PAXIL) 20 MG tablet   Chronic abdominal pain (Chronic)   Relevant Medications   PARoxetine (PAXIL) 20 MG tablet   cyclobenzaprine (FLEXERIL) 10 MG tablet   dicyclomine (BENTYL) 20 MG tablet   ondansetron (ZOFRAN) 4 MG tablet   Chronic pain syndrome (Chronic)    A; chronic MSK pain in diffuse locations. Patient has COPD and anxiety P: Continue prn nightly flexeril Continue treatment of potentially exacerbating medical conditions       Relevant Medications   cyclobenzaprine (FLEXERIL) 10 MG tablet   Essential hypertension - Primary (Chronic)    A: Blood pressure at goal. Meds: compliant  P: I will continue the patient's current medication regimen since her blood pressure is at goal.        Relevant Medications   hydrochlorothiazide (HYDRODIURIL) 25 MG tablet   Ventral hernia without obstruction or gangrene (Chronic)    Other Visit Diagnoses    Upper back pain        Relevant Medications    cyclobenzaprine (FLEXERIL) 10 MG tablet    Urinary frequency        Relevant Orders    POCT urinalysis dipstick (Completed)       No orders of the defined  types were placed in this encounter.    Follow-up: No Follow-up on file.   Dessa PhiJosalyn Hussam Muniz MD

## 2015-05-23 ENCOUNTER — Telehealth: Payer: Self-pay | Admitting: Pulmonary Disease

## 2015-05-23 MED ORDER — MOMETASONE FURO-FORMOTEROL FUM 100-5 MCG/ACT IN AERO: 2.0000 | INHALATION_SPRAY | Freq: Two times a day (BID) | RESPIRATORY_TRACT | Status: DC

## 2015-05-23 NOTE — Telephone Encounter (Signed)
?   We do not have a list with these dates that we can print off for pt. lmtcb X1 to see if a regular medication list will do

## 2015-05-23 NOTE — Telephone Encounter (Signed)
Patient called back and was advised samples are up front and we close at 5:00 pm today.

## 2015-05-23 NOTE — Telephone Encounter (Signed)
lmtcb X1 for pt.   Samples of dulera left up front for pt.  

## 2015-05-26 NOTE — Telephone Encounter (Signed)
LM for pt x 2 

## 2015-05-26 NOTE — Telephone Encounter (Signed)
Spoke with pt. Advised her that we do not have a way to list when she started her medications. She verbalized understanding. Would like a copy of her med list. This has been placed up front for pick up. Nothing further was needed.

## 2015-05-26 NOTE — Telephone Encounter (Signed)
[phone] pt calling back

## 2015-05-30 ENCOUNTER — Ambulatory Visit: Payer: No Typology Code available for payment source

## 2015-06-03 ENCOUNTER — Other Ambulatory Visit: Payer: Self-pay | Admitting: Family Medicine

## 2015-06-03 DIAGNOSIS — J439 Emphysema, unspecified: Secondary | ICD-10-CM

## 2015-06-03 MED ORDER — ALBUTEROL SULFATE 108 (90 BASE) MCG/ACT IN AEPB: 2.0000 | INHALATION_SPRAY | Freq: Four times a day (QID) | RESPIRATORY_TRACT | Status: DC | PRN

## 2015-06-04 NOTE — Telephone Encounter (Signed)
error 

## 2015-06-25 MED FILL — HYDROCHLOROTHIAZIDE 25 MG T: 25 | 30 days supply | Qty: 30 | Fill #3

## 2015-06-27 ENCOUNTER — Ambulatory Visit: Payer: No Typology Code available for payment source | Attending: Family Medicine

## 2015-07-16 ENCOUNTER — Other Ambulatory Visit: Payer: Self-pay | Admitting: Internal Medicine

## 2015-07-16 MED ORDER — TIOTROPIUM BROMIDE MONOHYDRATE 18 MCG IN CAPS: 18.0000 ug | ORAL_CAPSULE | Freq: Every day | RESPIRATORY_TRACT | Status: DC

## 2015-07-31 MED FILL — PARoxetine HCL 20 MG TABS: 20 | 30 days supply | Qty: 60 | Fill #2

## 2015-07-31 MED FILL — HYDROCHLOROTHIAZIDE 25 MG T: 25 | 30 days supply | Qty: 30 | Fill #4

## 2015-08-11 ENCOUNTER — Ambulatory Visit: Payer: Medicaid Other | Attending: Family Medicine | Admitting: Family Medicine

## 2015-08-11 ENCOUNTER — Encounter: Payer: Self-pay | Admitting: Family Medicine

## 2015-08-11 VITALS — BP 116/73 | HR 61 | Temp 98.1°F | Resp 16 | Ht 64.0 in | Wt 150.0 lb

## 2015-08-11 DIAGNOSIS — M549 Dorsalgia, unspecified: Secondary | ICD-10-CM | POA: Diagnosis not present

## 2015-08-11 DIAGNOSIS — M1990 Unspecified osteoarthritis, unspecified site: Secondary | ICD-10-CM | POA: Insufficient documentation

## 2015-08-11 DIAGNOSIS — Z1159 Encounter for screening for other viral diseases: Secondary | ICD-10-CM

## 2015-08-11 DIAGNOSIS — Z1231 Encounter for screening mammogram for malignant neoplasm of breast: Secondary | ICD-10-CM

## 2015-08-11 DIAGNOSIS — M19041 Primary osteoarthritis, right hand: Secondary | ICD-10-CM | POA: Diagnosis present

## 2015-08-11 DIAGNOSIS — Z79899 Other long term (current) drug therapy: Secondary | ICD-10-CM | POA: Diagnosis not present

## 2015-08-11 DIAGNOSIS — M19042 Primary osteoarthritis, left hand: Secondary | ICD-10-CM | POA: Diagnosis not present

## 2015-08-11 DIAGNOSIS — K5900 Constipation, unspecified: Secondary | ICD-10-CM | POA: Diagnosis not present

## 2015-08-11 DIAGNOSIS — Z0000 Encounter for general adult medical examination without abnormal findings: Secondary | ICD-10-CM

## 2015-08-11 DIAGNOSIS — K439 Ventral hernia without obstruction or gangrene: Secondary | ICD-10-CM | POA: Diagnosis not present

## 2015-08-11 DIAGNOSIS — F17210 Nicotine dependence, cigarettes, uncomplicated: Secondary | ICD-10-CM | POA: Insufficient documentation

## 2015-08-11 DIAGNOSIS — R42 Dizziness and giddiness: Secondary | ICD-10-CM | POA: Diagnosis not present

## 2015-08-11 DIAGNOSIS — Z114 Encounter for screening for human immunodeficiency virus [HIV]: Secondary | ICD-10-CM | POA: Diagnosis not present

## 2015-08-11 DIAGNOSIS — G8929 Other chronic pain: Secondary | ICD-10-CM | POA: Diagnosis not present

## 2015-08-11 DIAGNOSIS — R109 Unspecified abdominal pain: Secondary | ICD-10-CM | POA: Insufficient documentation

## 2015-08-11 DIAGNOSIS — R10A3 Flank pain, bilateral: Secondary | ICD-10-CM

## 2015-08-11 LAB — HEPATITIS C ANTIBODY: HCV Ab: NEGATIVE

## 2015-08-11 LAB — COMPLETE METABOLIC PANEL WITH GFR
ALT: 13 U/L (ref 6–29)
AST: 18 U/L (ref 10–35)
Albumin: 4.1 g/dL (ref 3.6–5.1)
Alkaline Phosphatase: 66 U/L (ref 33–130)
BUN: 10 mg/dL (ref 7–25)
CHLORIDE: 103 mmol/L (ref 98–110)
CO2: 27 mmol/L (ref 20–31)
Calcium: 9.8 mg/dL (ref 8.6–10.4)
Creat: 0.67 mg/dL (ref 0.50–1.05)
GFR, Est African American: >89 mL/min (ref >=60)
GLUCOSE: 71 mg/dL (ref 65–99)
POTASSIUM: 3.9 mmol/L (ref 3.5–5.3)
SODIUM: 141 mmol/L (ref 135–146)
Total Bilirubin: 0.3 mg/dL (ref 0.2–1.2)
Total Protein: 6.1 g/dL (ref 6.1–8.1)

## 2015-08-11 LAB — POCT URINALYSIS DIPSTICK
Bilirubin, UA: NEGATIVE
GLUCOSE UA: NEGATIVE
KETONES UA: NEGATIVE
Leukocytes, UA: NEGATIVE
Nitrite, UA: NEGATIVE
SPEC GRAV UA: 1.02
UROBILINOGEN UA: 0.2
pH, UA: 6

## 2015-08-11 LAB — HIV ANTIBODY (ROUTINE TESTING W REFLEX): HIV 1&2 Ab, 4th Generation: NONREACTIVE

## 2015-08-11 LAB — C-REACTIVE PROTEIN

## 2015-08-11 LAB — POCT GLYCOSYLATED HEMOGLOBIN (HGB A1C): Hemoglobin A1C: 5.6

## 2015-08-11 LAB — URIC ACID: Uric Acid, Serum: 4.6 mg/dL (ref 2.4–7.0)

## 2015-08-11 MED ORDER — TRAMADOL HCL 50 MG PO TABS: 50.0000 mg | ORAL_TABLET | Freq: Three times a day (TID) | ORAL | Status: DC | PRN

## 2015-08-11 NOTE — Patient Instructions (Addendum)
Maria Oliver was seen today for back pain and urinary frequency.  Diagnoses and all orders for this visit:  Healthcare maintenance -     HgB A1c  Bilateral flank pain -     POCT urinalysis dipstick -     Urine culture -     traMADol (ULTRAM) 50 MG tablet; Take 1 tablet (50 mg total) by mouth every 8 (eight) hours as needed. -     COMPLETE METABOLIC PANEL WITH GFR  Ventral hernia without obstruction or gangrene -     Ambulatory referral to General Surgery  Primary osteoarthritis of both hands -     traMADol (ULTRAM) 50 MG tablet; Take 1 tablet (50 mg total) by mouth every 8 (eight) hours as needed. -     Uric Acid -     Sedimentation Rate -     C-reactive protein  Encounter for screening for HIV -     HIV antibody (with reflex)  Need for hepatitis C screening test -     Hepatitis C antibody, reflex  Visit for screening mammogram -     MM DIGITAL SCREENING BILATERAL; Future   F/u in 4 weeks for arthritis   Dr. Armen Pickup

## 2015-08-11 NOTE — Progress Notes (Signed)
Subjective:  Patient ID: Maria Oliver, female    DOB: 1960-06-15  Age: 55 y.o. MRN: [id number]  CC: Back Pain and Urinary Frequency   HPI Maria Oliver presents for   1. Flank pain: bilateral x 1-2 weeks. With frequency and urgency. No dysuria, nausea, emesis, fever or chills. She has chronic pain in abdomen, hips, back. Pain is 5/10 most times. Up to 8/10 sometimes.She takes ibuprofen 600 mg BID for pain. She is intolerant of codeine, she experiences dizziness, nausea and emesis when she takes it.   2. Abdominal pain: chronic. She has large RLQ ventral hernia. She feels the hernia is enlarging. She admits to constipation and abdominal cramping. Bentyl helps with cramping. She cancelled her f/u colonoscopy scheduled in 05/2015 due to fear of complications. She would like to see a general surgeon to discuss hernia repair.   Social History  Substance Use Topics  . Smoking status: Current Every Day Smoker -- 0.50 packs/day for 40 years    Types: Cigarettes  . Smokeless tobacco: Never Used  . Alcohol Use: No    Outpatient Prescriptions Prior to Visit  Medication Sig Dispense Refill  . albuterol (VENTOLIN HFA) 108 (90 BASE) MCG/ACT inhaler Inhale 2 puffs into the lungs every 6 (six) hours as needed for wheezing or shortness of breath. 54 each 3  . Albuterol Sulfate (PROAIR RESPICLICK) 108 (90 BASE) MCG/ACT AEPB Inhale 2 puffs into the lungs every 6 (six) hours as needed (SOB). 1 each 2  . b complex vitamins tablet Take 1 tablet by mouth daily.    . Cholecalciferol (VITAMIN D3) 2000 UNITS TABS Take 2,000 Units by mouth daily. 30 tablet 11  . cyclobenzaprine (FLEXERIL) 10 MG tablet Take 1 tablet (10 mg total) by mouth at bedtime. 30 tablet 2  . diclofenac sodium (VOLTAREN) 1 % GEL Apply 2 g topically 4 (four) times daily. 100 g 3  . dicyclomine (BENTYL) 20 MG tablet Take 1 tablet (20 mg total) by mouth every 6 (six) hours as needed for spasms (abdominal cramping). 30 tablet 2  .  hydrochlorothiazide (HYDRODIURIL) 25 MG tablet Take 1 tablet (25 mg total) by mouth daily. 30 tablet 5  . hydrOXYzine (ATARAX/VISTARIL) 10 MG tablet Take 10 mg by mouth 3 (three) times daily as needed.    Marland Kitchen ibuprofen (ADVIL,MOTRIN) 600 MG tablet Take 1 tablet (600 mg total) by mouth every 8 (eight) hours as needed. 30 tablet 0  . mometasone-formoterol (DULERA) 100-5 MCG/ACT AERO Inhale 2 puffs into the lungs 2 (two) times daily. 2 Inhaler 0  . Multiple Vitamin (MULTIVITAMIN) tablet Take 1 tablet by mouth daily.    . ondansetron (ZOFRAN) 4 MG tablet Take 1 tablet (4 mg total) by mouth every 8 (eight) hours as needed for nausea or vomiting. 20 tablet 0  . PARoxetine (PAXIL) 20 MG tablet Take 2 tablets (40 mg total) by mouth every morning. 60 tablet 5  . tiotropium (SPIRIVA HANDIHALER) 18 MCG inhalation capsule Place 1 capsule (18 mcg total) into inhaler and inhale daily. (Patient not taking: Reported on 08/11/2015) 90 capsule 3   No facility-administered medications prior to visit.    ROS Review of Systems  Constitutional: Negative for fever and chills.  Eyes: Negative for visual disturbance.  Respiratory: Negative for shortness of breath.   Cardiovascular: Negative for chest pain.  Gastrointestinal: Negative for abdominal pain and blood in stool.  Genitourinary: Positive for frequency. Negative for dysuria.  Musculoskeletal: Positive for myalgias, back pain and arthralgias. Negative  for neck pain.  Skin: Negative for rash.  Allergic/Immunologic: Negative for immunocompromised state.  Hematological: Negative for adenopathy. Does not bruise/bleed easily.  Psychiatric/Behavioral: Negative for suicidal ideas and dysphoric mood.    Objective:  BP 116/73 mmHg  Pulse 61  Temp(Src) 98.1 F (36.7 C) (Oral)  Resp 16  Ht  (1.626 m)  Wt 150 lb (68.04 kg)  BMI 25.73 kg/m2  SpO2 95%  BP/Weight 08/11/2015 05/22/2015 05/15/2015  Systolic BP 116 112 122  Diastolic BP 73 68 84  Wt. (Lbs) 150  147 148  BMI 25.73 25.22 25.39   Physical Exam  Constitutional: She is oriented to person, place, and time. She appears well-developed and well-nourished. No distress.  HENT:  Head: Normocephalic and atraumatic.  Cardiovascular: Normal rate, regular rhythm, normal heart sounds and intact distal pulses.   Pulmonary/Chest: Effort normal and breath sounds normal.  Abdominal: There is no CVA tenderness. A hernia is present. Hernia confirmed positive in the ventral area.    Musculoskeletal: She exhibits tenderness. She exhibits no edema.       Arms: B/l lumbar tenderness   Neurological: She is alert and oriented to person, place, and time.  Skin: Skin is warm and dry. No rash noted.  Psychiatric: She has a normal mood and affect.   UA: trace blood, otherwise negative   Lab Results  Component Value Date   HGBA1C 5.6 08/11/2015   Assessment & Plan:   Maria Oliver was seen today for back pain and urinary frequency.  Diagnoses and all orders for this visit:  Healthcare maintenance -     HgB A1c  Bilateral flank pain -     POCT urinalysis dipstick -     Urine culture -     traMADol (ULTRAM) 50 MG tablet; Take 1 tablet (50 mg total) by mouth every 8 (eight) hours as needed. -     COMPLETE METABOLIC PANEL WITH GFR  Ventral hernia without obstruction or gangrene -     Ambulatory referral to General Surgery  Primary osteoarthritis of both hands -     traMADol (ULTRAM) 50 MG tablet; Take 1 tablet (50 mg total) by mouth every 8 (eight) hours as needed. -     Uric Acid -     Sedimentation Rate -     C-reactive protein  Encounter for screening for HIV -     HIV antibody (with reflex)  Need for hepatitis C screening test -     Hepatitis C antibody, reflex  Visit for screening mammogram -     MM DIGITAL SCREENING BILATERAL; Future    No orders of the defined types were placed in this encounter.    Follow-up: No Follow-up on file.   Maria Phi MD

## 2015-08-11 NOTE — Assessment & Plan Note (Signed)
A; OA in hands  P: Inflammatory markers Uric acid  Add tramadol to ibuprofen

## 2015-08-11 NOTE — Assessment & Plan Note (Signed)
A: ventral hernia, reducible with chronic abdominal pain P: Patient to reschedule colonoscopy gen surg referral placed

## 2015-08-11 NOTE — Assessment & Plan Note (Signed)
A: bilateral flank pain, MSK pain. Patient does not appear to have a UTI based on symptoms and today's UA.  P: Pain control Urine culture

## 2015-08-11 NOTE — Progress Notes (Signed)
C/C back and leg pain. Frequency urinating  Hx arthritis on hand, pain today  Pain scale #5 Tobacco user 1-3 cigarette per day  No suicidal thoughts in the past two weeks

## 2015-08-12 ENCOUNTER — Telehealth: Payer: Self-pay | Admitting: Family Medicine

## 2015-08-12 LAB — SEDIMENTATION RATE: SED RATE: 1 mm/h (ref 0–30)

## 2015-08-12 LAB — URINE CULTURE

## 2015-08-12 NOTE — Telephone Encounter (Signed)
Pt. Is still experiencing abdominal pain.Marland KitchenMarland KitchenMarland KitchenMarland Kitchenpatient wanted to know if lab work was back.... Please follow up with patient

## 2015-08-14 NOTE — Telephone Encounter (Signed)
Noted  

## 2015-08-14 NOTE — Telephone Encounter (Signed)
Date of birth verified by pt  Normal lab results given  Pt verbalized understanding    Pt stated still experiencing abdominal pain.

## 2015-08-14 NOTE — Telephone Encounter (Signed)
-----   Message from Dessa Phi, MD sent at 08/12/2015  8:47 AM EST ----- screening HIV and hep C negative Normal CMP Normal inflammatory markers

## 2015-08-21 ENCOUNTER — Ambulatory Visit
Admission: RE | Admit: 2015-08-21 | Discharge: 2015-08-21 | Disposition: A | Discharge status: Home / Self Care | Payer: No Typology Code available for payment source | Source: Ambulatory Visit | Attending: Family Medicine | Admitting: Family Medicine

## 2015-08-21 DIAGNOSIS — Z1231 Encounter for screening mammogram for malignant neoplasm of breast: Secondary | ICD-10-CM

## 2015-09-04 ENCOUNTER — Other Ambulatory Visit: Payer: Self-pay | Admitting: Internal Medicine

## 2015-09-04 MED FILL — HYDROCHLOROTHIAZIDE 25 MG T: 25 | 30 days supply | Qty: 30 | Fill #5

## 2015-09-08 ENCOUNTER — Ambulatory Visit: Payer: Medicaid Other | Attending: Family Medicine | Admitting: Family Medicine

## 2015-09-08 ENCOUNTER — Encounter: Payer: Self-pay | Admitting: Clinical

## 2015-09-08 ENCOUNTER — Other Ambulatory Visit: Payer: Self-pay | Admitting: *Deleted

## 2015-09-08 ENCOUNTER — Encounter: Payer: Self-pay | Admitting: Family Medicine

## 2015-09-08 VITALS — BP 106/68 | HR 60 | Temp 98.1°F | Resp 18 | Ht 64.0 in | Wt 149.0 lb

## 2015-09-08 DIAGNOSIS — J439 Emphysema, unspecified: Secondary | ICD-10-CM | POA: Insufficient documentation

## 2015-09-08 DIAGNOSIS — K029 Dental caries, unspecified: Secondary | ICD-10-CM

## 2015-09-08 DIAGNOSIS — Z79899 Other long term (current) drug therapy: Secondary | ICD-10-CM | POA: Diagnosis not present

## 2015-09-08 DIAGNOSIS — R1032 Left lower quadrant pain: Secondary | ICD-10-CM

## 2015-09-08 DIAGNOSIS — M19041 Primary osteoarthritis, right hand: Secondary | ICD-10-CM | POA: Diagnosis not present

## 2015-09-08 DIAGNOSIS — R109 Unspecified abdominal pain: Secondary | ICD-10-CM | POA: Diagnosis not present

## 2015-09-08 DIAGNOSIS — J449 Chronic obstructive pulmonary disease, unspecified: Secondary | ICD-10-CM | POA: Diagnosis not present

## 2015-09-08 DIAGNOSIS — M19042 Primary osteoarthritis, left hand: Secondary | ICD-10-CM

## 2015-09-08 DIAGNOSIS — G894 Chronic pain syndrome: Secondary | ICD-10-CM

## 2015-09-08 DIAGNOSIS — I10 Essential (primary) hypertension: Secondary | ICD-10-CM | POA: Insufficient documentation

## 2015-09-08 DIAGNOSIS — M25569 Pain in unspecified knee: Secondary | ICD-10-CM

## 2015-09-08 DIAGNOSIS — R05 Cough: Secondary | ICD-10-CM | POA: Diagnosis not present

## 2015-09-08 DIAGNOSIS — F17210 Nicotine dependence, cigarettes, uncomplicated: Secondary | ICD-10-CM | POA: Diagnosis not present

## 2015-09-08 DIAGNOSIS — G8929 Other chronic pain: Secondary | ICD-10-CM | POA: Diagnosis present

## 2015-09-08 DIAGNOSIS — L304 Erythema intertrigo: Secondary | ICD-10-CM | POA: Insufficient documentation

## 2015-09-08 DIAGNOSIS — R1030 Lower abdominal pain, unspecified: Secondary | ICD-10-CM

## 2015-09-08 DIAGNOSIS — R1031 Right lower quadrant pain: Secondary | ICD-10-CM

## 2015-09-08 MED ORDER — TRAMADOL HCL 50 MG PO TABS: 50.0000 mg | ORAL_TABLET | Freq: Three times a day (TID) | ORAL | Status: DC | PRN

## 2015-09-08 MED ORDER — NYSTATIN 100000 UNIT/GM EX CREA: 1.0000 "application " | TOPICAL_CREAM | Freq: Two times a day (BID) | CUTANEOUS | Status: DC

## 2015-09-08 MED ORDER — TRIAMCINOLONE ACETONIDE 0.1 % EX CREA: 1.0000 "application " | TOPICAL_CREAM | Freq: Two times a day (BID) | CUTANEOUS | Status: DC

## 2015-09-08 MED ORDER — HYDROCHLOROTHIAZIDE 25 MG PO TABS: 25.0000 mg | ORAL_TABLET | Freq: Every day | ORAL | Status: DC

## 2015-09-08 MED ORDER — MOMETASONE FURO-FORMOTEROL FUM 100-5 MCG/ACT IN AERO: 2.0000 | INHALATION_SPRAY | Freq: Two times a day (BID) | RESPIRATORY_TRACT | Status: DC

## 2015-09-08 MED ORDER — METHYLPREDNISOLONE ACETATE 40 MG/ML IJ SUSP
40.0000 mg | Freq: Once | INTRAMUSCULAR | Status: AC
  Administered 2015-09-08: 40 mg via INTRA_ARTICULAR

## 2015-09-08 MED ORDER — MOMETASONE FURO-FORMOTEROL FUM 200-5 MCG/ACT IN AERO: 2.0000 | INHALATION_SPRAY | Freq: Two times a day (BID) | RESPIRATORY_TRACT | Status: DC

## 2015-09-08 MED FILL — NYSTATIN 100,000 UNIT/GM CR: 100000 | 15 days supply | Qty: 30 | Fill #0

## 2015-09-08 MED FILL — TRIAMCINOLONE 0.1% CREAM: 0.1 | 30 days supply | Qty: 30 | Fill #0

## 2015-09-08 NOTE — Progress Notes (Signed)
[facility] 09/08/2015 05/22/2015 12/31/2014 08/20/2013  Decreased Interest 2 0 2 0  Down, Depressed, Hopeless 1 0 3 0  PHQ - 2 Score 3 0 5 0  Altered sleeping 2 - 3 -  Tired, decreased energy 3 - 3 -  Change in appetite 0 - 0 -  Feeling bad or failure about yourself  1 - 3 -  Trouble concentrating 1 - 2 -  Moving slowly or fidgety/restless 0 - 0 -  Suicidal thoughts 0 - (No Data) -  PHQ-9 Score 10 - 16 -    GAD 7 : Generalized Anxiety Score 09/08/2015  Nervous, Anxious, on Edge 2  Control/stop worrying 2  Worry too much - different things 2  Trouble relaxing 2  Restless 1  Easily annoyed or irritable 2  Afraid - awful might happen 1  Total GAD 7 Score 12

## 2015-09-08 NOTE — Assessment & Plan Note (Signed)
A: COPD in smoker.  Med: non compliant with dulera 100-5 P: Prescribed dulera 200-5 as sample available to patient today

## 2015-09-08 NOTE — Progress Notes (Signed)
Patient is here for FU Arthritis  Patient complains of chronic arthritis scaled currently at a 5.  Patient denies any suicidal ideations at this time.

## 2015-09-08 NOTE — Assessment & Plan Note (Signed)
A: chronic knee pain P: L knee corticosteroid injection Standing knee x-rays

## 2015-09-08 NOTE — Progress Notes (Signed)
Subjective:  Patient ID: Maria Oliver, female    DOB: Aug 24, 1960  Age: 55 y.o. MRN: [id number]  CC: Follow-up   HPI Maria Oliver presents for    1. Chronic pain: in b/l groin and knees. Worse in L knee. Has intermittent knee swelling. No knee pain.   2. COPD: not using Dulera. Still smoking. No CP or SOB. Has intermittent cough.   Social History  Substance Use Topics  . Smoking status: Current Every Day Smoker -- 0.50 packs/day for 40 years    Types: Cigarettes  . Smokeless tobacco: Never Used  . Alcohol Use: No    Outpatient Prescriptions Prior to Visit  Medication Sig Dispense Refill  . albuterol (VENTOLIN HFA) 108 (90 BASE) MCG/ACT inhaler Inhale 2 puffs into the lungs every 6 (six) hours as needed for wheezing or shortness of breath. 54 each 3  . Albuterol Sulfate (PROAIR RESPICLICK) 108 (90 BASE) MCG/ACT AEPB Inhale 2 puffs into the lungs every 6 (six) hours as needed (SOB). 1 each 2  . b complex vitamins tablet Take 1 tablet by mouth daily.    . Cholecalciferol (VITAMIN D3) 2000 UNITS TABS Take 2,000 Units by mouth daily. 30 tablet 11  . cyclobenzaprine (FLEXERIL) 10 MG tablet Take 1 tablet (10 mg total) by mouth at bedtime. 30 tablet 2  . diclofenac sodium (VOLTAREN) 1 % GEL Apply 2 g topically 4 (four) times daily. 100 g 3  . dicyclomine (BENTYL) 20 MG tablet Take 1 tablet (20 mg total) by mouth every 6 (six) hours as needed for spasms (abdominal cramping). 30 tablet 2  . hydrochlorothiazide (HYDRODIURIL) 25 MG tablet Take 1 tablet (25 mg total) by mouth daily. 30 tablet 5  . hydrOXYzine (ATARAX/VISTARIL) 10 MG tablet Take 10 mg by mouth 3 (three) times daily as needed.    Marland Kitchen. ibuprofen (ADVIL,MOTRIN) 600 MG tablet Take 1 tablet (600 mg total) by mouth every 8 (eight) hours as needed. 30 tablet 0  . Multiple Vitamin (MULTIVITAMIN) tablet Take 1 tablet by mouth daily.    . ondansetron (ZOFRAN) 4 MG tablet Take 1 tablet (4 mg total) by mouth every 8 (eight) hours as  needed for nausea or vomiting. 20 tablet 0  . PARoxetine (PAXIL) 20 MG tablet Take 2 tablets (40 mg total) by mouth every morning. 60 tablet 5  . tiotropium (SPIRIVA HANDIHALER) 18 MCG inhalation capsule Place 1 capsule (18 mcg total) into inhaler and inhale daily. 90 capsule 3  . traMADol (ULTRAM) 50 MG tablet Take 1 tablet (50 mg total) by mouth every 8 (eight) hours as needed. 60 tablet 0  . mometasone-formoterol (DULERA) 100-5 MCG/ACT AERO Inhale 2 puffs into the lungs 2 (two) times daily. (Patient not taking: Reported on 09/08/2015) 2 Inhaler 0   No facility-administered medications prior to visit.    ROS Review of Systems  Constitutional: Negative for fever and chills.  HENT: Positive for dental problem.   Eyes: Negative for visual disturbance.  Respiratory: Negative for shortness of breath.   Cardiovascular: Negative for chest pain.  Gastrointestinal: Negative for abdominal pain and blood in stool.  Genitourinary: Negative for dysuria.  Musculoskeletal: Positive for myalgias, joint swelling and arthralgias. Negative for back pain and neck pain.  Skin: Negative for rash.  Allergic/Immunologic: Negative for immunocompromised state.  Hematological: Negative for adenopathy. Does not bruise/bleed easily.  Psychiatric/Behavioral: Negative for suicidal ideas and dysphoric mood.    Objective:  BP 106/68 mmHg  Pulse 60  Temp(Src) 98.1 F (36.7  C) (Oral)  Resp 18  Ht  (1.626 m)  Wt 149 lb (67.586 kg)  BMI 25.56 kg/m2  SpO2 95%  BP/Weight 09/08/2015 08/11/2015 05/22/2015  Systolic BP 106 116 112  Diastolic BP 68 73 68  Wt. (Lbs) 149 150 147  BMI 25.56 25.73 25.22   Physical Exam  Constitutional: She is oriented to person, place, and time. She appears well-developed and well-nourished. No distress.  HENT:  Head: Normocephalic and atraumatic.  Cardiovascular: Normal rate, regular rhythm, normal heart sounds and intact distal pulses.   Pulmonary/Chest: Effort normal and  breath sounds normal.  Abdominal: A hernia is present. Hernia confirmed positive in the ventral area.    Musculoskeletal: She exhibits no edema.       Right hip: Normal.       Left hip: Normal.       Left knee: She exhibits decreased range of motion. She exhibits no swelling, no effusion, no ecchymosis, no deformity, no laceration, no erythema, normal alignment, no LCL laxity and normal patellar mobility. Tenderness found. Medial joint line tenderness noted.       Legs: Neurological: She is alert and oriented to person, place, and time.  Skin: Skin is warm and dry. No rash noted.  Psychiatric: She has a normal mood and affect.   After obtaining informed consent and cleaning the skin using iodine and alcohol a  steroid injection was performed at L knee using 1% plain Lidocaine and 40 mg of Depo Medrol. This was well tolerated  Assessment & Plan:   Maria Oliver was seen today for follow-up.  Diagnoses and all orders for this visit:  Chronic knee pain, unspecified laterality -     Ambulatory referral to Pain Clinic -     DG Knee Bilateral Standing AP; Future -     methylPREDNISolone acetate (DEPO-MEDROL) injection 40 mg; Inject 1 mL (40 mg total) into the articular space once.  Chronic pain syndrome -     Ambulatory referral to Pain Clinic  Pulmonary emphysema, unspecified emphysema type (HCC) -     Discontinue: mometasone-formoterol (DULERA) 100-5 MCG/ACT AERO; Inhale 2 puffs into the lungs 2 (two) times daily. -     mometasone-formoterol (DULERA) 200-5 MCG/ACT inhaler 2 puff; Inhale 2 puffs into the lungs 2 (two) times daily.  Bilateral groin pain -     DG HIPS BILAT WITH PELVIS 2V; Future  Bilateral flank pain -     traMADol (ULTRAM) 50 MG tablet; Take 1 tablet (50 mg total) by mouth every 8 (eight) hours as needed.  Primary osteoarthritis of both hands -     traMADol (ULTRAM) 50 MG tablet; Take 1 tablet (50 mg total) by mouth every 8 (eight) hours as needed.  Dental cavities -      Ambulatory referral to Dentistry  Intertrigo -     triamcinolone cream (KENALOG) 0.1 %; Apply 1 application topically 2 (two) times daily. -     nystatin cream (MYCOSTATIN); Apply 1 application topically 2 (two) times daily.  Essential hypertension -     hydrochlorothiazide (HYDRODIURIL) 25 MG tablet; Take 1 tablet (25 mg total) by mouth daily.   Meds ordered this encounter  Medications  . DISCONTD: mometasone-formoterol (DULERA) 100-5 MCG/ACT AERO    Sig: Inhale 2 puffs into the lungs 2 (two) times daily.    Dispense:  2 Inhaler    Refill:  0  . methylPREDNISolone acetate (DEPO-MEDROL) injection 40 mg    Sig:   . traMADol (ULTRAM) 50 MG  tablet    Sig: Take 1 tablet (50 mg total) by mouth every 8 (eight) hours as needed.    Dispense:  60 tablet    Refill:  0  . mometasone-formoterol (DULERA) 200-5 MCG/ACT inhaler 2 puff    Sig:     Follow-up: No Follow-up on file.   Dessa Phi MD

## 2015-09-08 NOTE — Patient Instructions (Addendum)
Maria Oliver was seen today for follow-up.  Diagnoses and all orders for this visit:  Chronic knee pain, unspecified laterality -     Ambulatory referral to Pain Clinic -     DG Knee Bilateral Standing AP; Future -     methylPREDNISolone acetate (DEPO-MEDROL) injection 40 mg; Inject 1 mL (40 mg total) into the articular space once.  Chronic pain syndrome -     Ambulatory referral to Pain Clinic  Pulmonary emphysema, unspecified emphysema type (HCC) -     Discontinue: mometasone-formoterol (DULERA) 100-5 MCG/ACT AERO; Inhale 2 puffs into the lungs 2 (two) times daily. -     mometasone-formoterol (DULERA) 200-5 MCG/ACT inhaler 2 puff; Inhale 2 puffs into the lungs 2 (two) times daily.  Bilateral groin pain -     DG HIPS BILAT WITH PELVIS 2V; Future  Bilateral flank pain -     traMADol (ULTRAM) 50 MG tablet; Take 1 tablet (50 mg total) by mouth every 8 (eight) hours as needed.  Primary osteoarthritis of both hands -     traMADol (ULTRAM) 50 MG tablet; Take 1 tablet (50 mg total) by mouth every 8 (eight) hours as needed.  Dental cavities -     Ambulatory referral to Dentistry  Intertrigo -     triamcinolone cream (KENALOG) 0.1 %; Apply 1 application topically 2 (two) times daily. -     nystatin cream (MYCOSTATIN); Apply 1 application topically 2 (two) times daily.  Essential hypertension -     hydrochlorothiazide (HYDRODIURIL) 25 MG tablet; Take 1 tablet (25 mg total) by mouth daily.   You have received a shot of steroid in your joint today. Rest and ice knee today. Regular activity tomorrow. Look out for redness, swelling, fever,severe pain in joint and call if you experience these symptoms.   F/u in 4 weeks for R knee injection  Dr. Armen PickupFunches

## 2015-09-10 MED FILL — traMADol HCL 50 MG TABS: 50 | 20 days supply | Qty: 60 | Fill #0

## 2015-10-01 ENCOUNTER — Other Ambulatory Visit: Payer: Self-pay | Admitting: Family Medicine

## 2015-10-01 ENCOUNTER — Ambulatory Visit (HOSPITAL_COMMUNITY)
Admission: RE | Admit: 2015-10-01 | Discharge: 2015-10-01 | Disposition: A | Discharge status: Home / Self Care | Payer: Medicaid Other | Source: Ambulatory Visit | Attending: Family Medicine | Admitting: Family Medicine

## 2015-10-01 DIAGNOSIS — R1032 Left lower quadrant pain: Secondary | ICD-10-CM

## 2015-10-01 DIAGNOSIS — M170 Bilateral primary osteoarthritis of knee: Secondary | ICD-10-CM | POA: Insufficient documentation

## 2015-10-01 DIAGNOSIS — G8929 Other chronic pain: Secondary | ICD-10-CM

## 2015-10-01 DIAGNOSIS — R1030 Lower abdominal pain, unspecified: Secondary | ICD-10-CM | POA: Insufficient documentation

## 2015-10-01 DIAGNOSIS — R1031 Right lower quadrant pain: Secondary | ICD-10-CM

## 2015-10-01 DIAGNOSIS — M25562 Pain in left knee: Secondary | ICD-10-CM | POA: Diagnosis not present

## 2015-10-01 DIAGNOSIS — M25569 Pain in unspecified knee: Secondary | ICD-10-CM | POA: Diagnosis present

## 2015-10-01 DIAGNOSIS — M25561 Pain in right knee: Secondary | ICD-10-CM | POA: Diagnosis not present

## 2015-10-01 MED FILL — PARoxetine HCL 40 MG TABS: 40 | 30 days supply | Qty: 30 | Fill #0

## 2015-10-01 MED FILL — hydrOXYzine HCL 10 MG TABS: 10 | 30 days supply | Qty: 90 | Fill #0

## 2015-10-03 ENCOUNTER — Other Ambulatory Visit: Payer: Self-pay | Admitting: Family Medicine

## 2015-10-06 MED FILL — !VENTOLIN HFA INHALER: 108 (90 BAS | 25 days supply | Qty: 18 | Fill #0

## 2015-10-06 MED FILL — SPIRIVA 18 MCG CP-HANDIHALE: 18 | 30 days supply | Qty: 30 | Fill #0

## 2015-10-06 MED FILL — ?HYDROCHLOROTHIAZIDE 25 MG: 25 MG | 30 days supply | Qty: 30 | Fill #0

## 2015-10-08 ENCOUNTER — Other Ambulatory Visit: Payer: Self-pay | Admitting: *Deleted

## 2015-10-08 DIAGNOSIS — J439 Emphysema, unspecified: Secondary | ICD-10-CM

## 2015-10-08 MED ORDER — ALBUTEROL SULFATE HFA 108 (90 BASE) MCG/ACT IN AERS: 2.0000 | INHALATION_SPRAY | Freq: Four times a day (QID) | RESPIRATORY_TRACT | Status: DC | PRN

## 2015-10-13 ENCOUNTER — Ambulatory Visit: Payer: Medicaid Other | Attending: Family Medicine | Admitting: Family Medicine

## 2015-10-13 ENCOUNTER — Encounter: Payer: Self-pay | Admitting: Family Medicine

## 2015-10-13 VITALS — BP 110/68 | HR 70 | Temp 98.3°F | Resp 16 | Ht 64.0 in | Wt 146.0 lb

## 2015-10-13 DIAGNOSIS — B001 Herpesviral vesicular dermatitis: Secondary | ICD-10-CM | POA: Insufficient documentation

## 2015-10-13 DIAGNOSIS — F17210 Nicotine dependence, cigarettes, uncomplicated: Secondary | ICD-10-CM | POA: Insufficient documentation

## 2015-10-13 DIAGNOSIS — Z79899 Other long term (current) drug therapy: Secondary | ICD-10-CM | POA: Diagnosis not present

## 2015-10-13 DIAGNOSIS — B351 Tinea unguium: Secondary | ICD-10-CM | POA: Insufficient documentation

## 2015-10-13 DIAGNOSIS — G8929 Other chronic pain: Secondary | ICD-10-CM | POA: Insufficient documentation

## 2015-10-13 DIAGNOSIS — M25569 Pain in unspecified knee: Secondary | ICD-10-CM | POA: Insufficient documentation

## 2015-10-13 MED ORDER — ACYCLOVIR 400 MG PO TABS
400.0000 mg | ORAL_TABLET | Freq: Three times a day (TID) | ORAL | Status: DC
Start: 2015-10-13 — End: 2016-03-19

## 2015-10-13 MED ORDER — METHYLPREDNISOLONE ACETATE 40 MG/ML IJ SUSP
40.0000 mg | Freq: Once | INTRAMUSCULAR | Status: AC
  Administered 2015-10-13: 40 mg via INTRA_ARTICULAR

## 2015-10-13 MED ORDER — TERBINAFINE HCL 250 MG PO TABS: 250.0000 mg | ORAL_TABLET | Freq: Every day | ORAL | Status: DC

## 2015-10-13 MED FILL — TERBINAFINE HCL 250 MG TAB: 250 | 30 days supply | Qty: 30 | Fill #0

## 2015-10-13 MED FILL — ACYCLOVIR 400 MG TABLET: 400 | 5 days supply | Qty: 15 | Fill #0

## 2015-10-13 NOTE — Patient Instructions (Addendum)
Maria Oliver was seen today for knee pain.  Diagnoses and all orders for this visit:  Onychomycosis of right great toe -     Ambulatory referral to Podiatry -     terbinafine (LAMISIL) 250 MG tablet; Take 1 tablet (250 mg total) by mouth daily.  Cold sore -     acyclovir (ZOVIRAX) 400 MG tablet; Take 1 tablet (400 mg total) by mouth 3 (three) times daily.  Chronic knee pain, unspecified laterality -     methylPREDNISolone acetate (DEPO-MEDROL) injection 40 mg; Inject 1 mL (40 mg total) into the articular space once.   You have received a shot of steroid in your joint today. Rest and ice knee today. Regular activity tomorrow. Look out for redness, swelling, fever,severe pain in joint and call if you experience these symptoms.  F/u in  6 weeks for fatigue   Dr. Armen PickupFunches

## 2015-10-13 NOTE — Assessment & Plan Note (Signed)
Podiatry referral Course of lamisil

## 2015-10-13 NOTE — Progress Notes (Signed)
Subjective:  Patient ID: Maria Oliver, female    DOB: February 06, 1961  Age: 55 y.o. MRN: [id number]  CC: Knee Pain   HPI Maria Renollen D Fearn presents for   1. R knee injection: she tolerated the L knee injection well. Request R today. Has chronic knee pain. No significant swelling.  2. Fatigue: x one week. With nausea and emesis x one yesterday. Getting cold sores. No fever or chill. No known sick contacts.   3. Referral: request referral/order to get a donated mattress as she had a recent bed bug infestation.   Social History  Substance Use Topics  . Smoking status: Current Every Day Smoker -- 0.50 packs/day for 40 years    Types: Cigarettes  . Smokeless tobacco: Never Used  . Alcohol Use: No    Outpatient Prescriptions Prior to Visit  Medication Sig Dispense Refill  . albuterol (VENTOLIN HFA) 108 (90 Base) MCG/ACT inhaler Inhale 2 puffs into the lungs every 6 (six) hours as needed for wheezing or shortness of breath. 54 each 3  . Albuterol Sulfate (PROAIR RESPICLICK) 108 (90 BASE) MCG/ACT AEPB Inhale 2 puffs into the lungs every 6 (six) hours as needed (SOB). 1 each 2  . b complex vitamins tablet Take 1 tablet by mouth daily.    . Cholecalciferol (VITAMIN D3) 2000 UNITS TABS Take 2,000 Units by mouth daily. 30 tablet 11  . cyclobenzaprine (FLEXERIL) 10 MG tablet Take 1 tablet (10 mg total) by mouth at bedtime. 30 tablet 2  . diclofenac sodium (VOLTAREN) 1 % GEL Apply 2 g topically 4 (four) times daily. 100 g 3  . dicyclomine (BENTYL) 20 MG tablet Take 1 tablet (20 mg total) by mouth every 6 (six) hours as needed for spasms (abdominal cramping). 30 tablet 2  . hydrochlorothiazide (HYDRODIURIL) 25 MG tablet Take 1 tablet (25 mg total) by mouth daily. 30 tablet 5  . hydrOXYzine (ATARAX/VISTARIL) 10 MG tablet Take 10 mg by mouth 3 (three) times daily as needed.    Marland Kitchen. ibuprofen (ADVIL,MOTRIN) 600 MG tablet Take 1 tablet (600 mg total) by mouth every 8 (eight) hours as needed. 30 tablet 0    . Multiple Vitamin (MULTIVITAMIN) tablet Take 1 tablet by mouth daily.    Marland Kitchen. nystatin cream (MYCOSTATIN) Apply 1 application topically 2 (two) times daily. 30 g 3  . ondansetron (ZOFRAN) 4 MG tablet Take 1 tablet (4 mg total) by mouth every 8 (eight) hours as needed for nausea or vomiting. 20 tablet 0  . PARoxetine (PAXIL) 20 MG tablet Take 2 tablets (40 mg total) by mouth every morning. 60 tablet 5  . tiotropium (SPIRIVA HANDIHALER) 18 MCG inhalation capsule Place 1 capsule (18 mcg total) into inhaler and inhale daily. 90 capsule 3  . traMADol (ULTRAM) 50 MG tablet Take 1 tablet (50 mg total) by mouth every 8 (eight) hours as needed. 60 tablet 0  . triamcinolone cream (KENALOG) 0.1 % Apply 1 application topically 2 (two) times daily. 30 g 3   Facility-Administered Medications Prior to Visit  Medication Dose Route Frequency Provider Last Rate Last Dose  . mometasone-formoterol (DULERA) 200-5 MCG/ACT inhaler 2 puff  2 puff Inhalation BID Olanrewaju Osborn, MD        ROS Review of Systems  Constitutional: Negative for fever and chills.  HENT: Positive for dental problem.   Eyes: Negative for visual disturbance.  Respiratory: Negative for shortness of breath.   Cardiovascular: Negative for chest pain.  Gastrointestinal: Negative for abdominal pain and blood  in stool.  Genitourinary: Negative for dysuria.  Musculoskeletal: Positive for myalgias and joint swelling. Negative for back pain, arthralgias and neck pain.  Skin: Positive for rash.  Allergic/Immunologic: Negative for immunocompromised state.  Hematological: Negative for adenopathy. Does not bruise/bleed easily.  Psychiatric/Behavioral: Negative for suicidal ideas and dysphoric mood.    Objective:  BP 110/68 mmHg  Pulse 70  Temp(Src) 98.3 F (36.8 C) (Oral)  Resp 16  Ht  (1.626 m)  Wt 146 lb (66.225 kg)  BMI 25.05 kg/m2  SpO2 92%  BP/Weight 10/13/2015 09/08/2015 08/11/2015  Systolic BP 110 106 116  Diastolic BP 68 68 73   Wt. (Lbs) 146 149 150  BMI 25.05 25.56 25.73    Physical Exam  Constitutional: She is oriented to person, place, and time. She appears well-developed and well-nourished. No distress.  HENT:  Head: Normocephalic and atraumatic.  Mouth/Throat:    Cardiovascular: Normal rate, regular rhythm, normal heart sounds and intact distal pulses.   Pulmonary/Chest: Effort normal and breath sounds normal.  Abdominal: A hernia is present. Hernia confirmed positive in the ventral area.    Musculoskeletal: She exhibits no edema.       Right hip: Normal.       Left hip: Normal.       Left knee: She exhibits decreased range of motion. She exhibits no swelling, no effusion, no ecchymosis, no deformity, no laceration, no erythema, normal alignment, no LCL laxity and normal patellar mobility. Tenderness found. Medial joint line tenderness noted.       Legs: Neurological: She is alert and oriented to person, place, and time.  Skin: Skin is warm and dry. No rash noted.  Psychiatric: She has a normal mood and affect.    After obtaining informed consent and cleaning the skin using iodine and alcohol a  steroid injection was performed at R knee using 1% plain Lidocaine and 40 mg of Depo Medrol. This was well tolerated  Assessment & Plan:   There are no diagnoses linked to this encounter. Holden was seen today for knee pain.  Diagnoses and all orders for this visit:  Onychomycosis of right great toe -     Ambulatory referral to Podiatry -     terbinafine (LAMISIL) 250 MG tablet; Take 1 tablet (250 mg total) by mouth daily.  Cold sore -     acyclovir (ZOVIRAX) 400 MG tablet; Take 1 tablet (400 mg total) by mouth 3 (three) times daily.  Chronic knee pain, unspecified laterality -     methylPREDNISolone acetate (DEPO-MEDROL) injection 40 mg; Inject 1 mL (40 mg total) into the articular space once.   Meds ordered this encounter  Medications  . terbinafine (LAMISIL) 250 MG tablet    Sig: Take 1  tablet (250 mg total) by mouth daily.    Dispense:  30 tablet    Refill:  2  . acyclovir (ZOVIRAX) 400 MG tablet    Sig: Take 1 tablet (400 mg total) by mouth 3 (three) times daily.    Dispense:  15 tablet    Refill:  0  . methylPREDNISolone acetate (DEPO-MEDROL) injection 40 mg    Sig:     Follow-up: No Follow-up on file.   Dessa Phi MD

## 2015-10-13 NOTE — Assessment & Plan Note (Signed)
Course of valtrex  

## 2015-10-13 NOTE — Progress Notes (Signed)
F/U rt knee injection  Pain scale # 5 Stated body ache, no energy, vomiting  Tobacco user 6 cigarette per day  No suicidal thought in the past two weeks

## 2015-10-13 NOTE — Assessment & Plan Note (Signed)
Steroid injection done today

## 2015-10-15 ENCOUNTER — Telehealth: Payer: Self-pay | Admitting: *Deleted

## 2015-10-15 NOTE — Telephone Encounter (Signed)
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

## 2015-10-22 ENCOUNTER — Other Ambulatory Visit: Payer: Self-pay | Admitting: *Deleted

## 2015-10-22 MED ORDER — TIOTROPIUM BROMIDE MONOHYDRATE 18 MCG IN CAPS: 18.0000 ug | ORAL_CAPSULE | Freq: Every day | RESPIRATORY_TRACT | Status: DC

## 2015-10-27 ENCOUNTER — Ambulatory Visit (INDEPENDENT_AMBULATORY_CARE_PROVIDER_SITE_OTHER): Payer: Self-pay | Admitting: Pulmonary Disease

## 2015-10-27 ENCOUNTER — Encounter: Payer: Self-pay | Admitting: Pulmonary Disease

## 2015-10-27 VITALS — BP 136/78 | HR 66 | Ht 64.0 in | Wt 146.0 lb

## 2015-10-27 DIAGNOSIS — F17200 Nicotine dependence, unspecified, uncomplicated: Secondary | ICD-10-CM

## 2015-10-27 DIAGNOSIS — J439 Emphysema, unspecified: Secondary | ICD-10-CM

## 2015-10-27 NOTE — Assessment & Plan Note (Signed)
Counseled to quit smoking today, given literature and community resource information.
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
## 2015-10-27 NOTE — Patient Instructions (Signed)
Stop smoking Stop smoking Stop smoking Keep taking Dulera twice a day, not once a day Takes Spiriva daily I will see back in 6 months or sooner if needed

## 2015-10-27 NOTE — Assessment & Plan Note (Signed)
This has been a stable interval for her. She has not had a flareup since the last visit but she continues to be symptomatic in the setting of her moderate airflow obstruction. I believe she has an asthma COPD overlap syndrome. She is currently not taking her medications appropriately and she continues to smoke.  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

## 2015-11-05 MED FILL — ?HYDROCHLOROTHIAZIDE 25 MG: 25 MG | 30 days supply | Qty: 30 | Fill #1

## 2015-11-17 ENCOUNTER — Ambulatory Visit: Payer: Self-pay | Attending: Podiatry | Admitting: Sports Medicine

## 2015-11-17 DIAGNOSIS — B351 Tinea unguium: Secondary | ICD-10-CM

## 2015-11-17 DIAGNOSIS — M79674 Pain in right toe(s): Secondary | ICD-10-CM

## 2015-11-17 MED ORDER — CICLOPIROX 8 % EX SOLN: Freq: Every day | CUTANEOUS | Status: DC

## 2015-11-17 NOTE — Progress Notes (Addendum)
Patient ID: Maria Oliver, female   DOB: 1960-07-16, 55 y.o.   MRN: [id number] Subjective: Maria Oliver is a 55 y.o. female patient [facility] with complaint of painful thickened and discolored nail, right great toenail, started on Lamisil has taken 3 month course and was started back on the medication again. Patient states that the nail is not getting better. Reports that nail at right great toe is becoming difficult to manage because of the thickness. Admits to past history of nail avulsion >1 year ago. Patient has no other pedal complaints at this time.   Patient Active Problem List   Diagnosis Date Noted  . Onychomycosis of right great toe 10/13/2015  . Cold sore 10/13/2015  . Osteoarthritis 08/11/2015  . Bilateral flank pain 08/11/2015  . Chronic abdominal pain 05/22/2015  . Chronic pain syndrome 05/22/2015  . Vitamin D insufficiency 02/06/2015  . Chronic knee pain 02/06/2015  . Ventral hernia without obstruction or gangrene 12/31/2014  . Anxiety 12/31/2014  . COPD (chronic obstructive pulmonary disease) with emphysema (HCC)   . SMOKER 10/01/2009  . Essential hypertension 10/01/2009    Current Outpatient Prescriptions on File Prior to Visit  Medication Sig Dispense Refill  . acyclovir (ZOVIRAX) 400 MG tablet Take 1 tablet (400 mg total) by mouth 3 (three) times daily. 15 tablet 0  . Albuterol Sulfate (PROAIR RESPICLICK) 108 (90 BASE) MCG/ACT AEPB Inhale 2 puffs into the lungs every 6 (six) hours as needed (SOB). 1 each 2  . b complex vitamins tablet Take 1 tablet by mouth daily.    . Cholecalciferol (VITAMIN D3) 2000 UNITS TABS Take 2,000 Units by mouth daily. 30 tablet 11  . cyclobenzaprine (FLEXERIL) 10 MG tablet Take 1 tablet (10 mg total) by mouth at bedtime. 30 tablet 2  . diclofenac sodium (VOLTAREN) 1 % GEL Apply 2 g topically 4 (four) times daily. 100 g 3  . dicyclomine (BENTYL) 20 MG tablet Take 1 tablet (20 mg total) by mouth every 6 (six) hours as needed  for spasms (abdominal cramping). 30 tablet 2  . hydrochlorothiazide (HYDRODIURIL) 25 MG tablet Take 1 tablet (25 mg total) by mouth daily. 30 tablet 5  . hydrOXYzine (ATARAX/VISTARIL) 10 MG tablet Take 10 mg by mouth 3 (three) times daily as needed.    Marland Kitchen. ibuprofen (ADVIL,MOTRIN) 600 MG tablet Take 1 tablet (600 mg total) by mouth every 8 (eight) hours as needed. 30 tablet 0  . Multiple Vitamin (MULTIVITAMIN) tablet Take 1 tablet by mouth daily.    Marland Kitchen. nystatin cream (MYCOSTATIN) Apply 1 application topically 2 (two) times daily. 30 g 3  . ondansetron (ZOFRAN) 4 MG tablet Take 1 tablet (4 mg total) by mouth every 8 (eight) hours as needed for nausea or vomiting. 20 tablet 0  . PARoxetine (PAXIL) 20 MG tablet Take 2 tablets (40 mg total) by mouth every morning. 60 tablet 5  . terbinafine (LAMISIL) 250 MG tablet Take 1 tablet (250 mg total) by mouth daily. 30 tablet 2  . tiotropium (SPIRIVA HANDIHALER) 18 MCG inhalation capsule Place 1 capsule (18 mcg total) into inhaler and inhale daily. 90 capsule 3  . traMADol (ULTRAM) 50 MG tablet Take 1 tablet (50 mg total) by mouth every 8 (eight) hours as needed. 60 tablet 0  . triamcinolone cream (KENALOG) 0.1 % Apply 1 application topically 2 (two) times daily. 30 g 3   Current Facility-Administered Medications on File Prior to Visit  Medication Dose Route Frequency Provider Last Rate  Last Dose  . mometasone-formoterol (DULERA) 200-5 MCG/ACT inhaler 2 puff  2 puff Inhalation BID Dessa Phi, MD        Allergies  Allergen Reactions  . Aspirin Other (See Comments)    REACTION: GI upset/Hernia   . Ciprofloxacin Other (See Comments)    Tendonitis, joint pain, and nausea.  "Interfered with her HCTZ"  . Codeine Other (See Comments)    REACTION: GI upset/Hernia  . Eggs Or Egg-Derived Products     Stomach pains  . Penicillins Rash    Objective: Physical Exam  General: Well developed, nourished, no acute distress, awake, alert and oriented x  3  Vascular: Dorsalis pedis artery 2/4 bilateral, Posterior tibial artery 2/4 bilateral, skin temperature warm to warm proximal to distal bilateral lower extremities, no varicosities, pedal hair present bilateral.  Neurological: Gross sensation present via light touch bilateral.   Dermatological: Skin is warm, dry, and supple bilateral,Right hallux, short thick, and discolored with mild subungal debris, all other nails within normal limits, no webspace macerations present bilateral, no open lesions present bilateral, no callus/corns/hyperkeratotic tissue present bilateral. No signs of infection bilateral.  Musculoskeletal: No boney deformities noted bilateral. Muscular strength within normal limits without painon range of motion. No pain with calf compression bilateral.  Assessment and Plan:  Problem List Items Addressed This Visit    None    Visit Diagnoses    Fungal nail infection    -  Primary    Relevant Medications    ciclopirox (PENLAC) 8 % solution    Great toe pain, right        Relevant Medications    ciclopirox (PENLAC) 8 % solution      -Examined patient -Discussed treatment options for painful dystrophic right hallux nail -Advised patient to continue with PO lamisil as Rx by PCP -Rx Penlac to use toe right hallux nail and file in between applications -Recommend patient that she needs to give her nails at least 1 year to grow out before can determine if PO lamisil has worked -Patient to return as needed or sooner if symptoms worsen.  Asencion Islam, DPM

## 2015-11-18 MED FILL — CICLOPIROX 8% SOLUTION: 8 | 7 days supply | Qty: 7 | Fill #0

## 2015-11-19 ENCOUNTER — Emergency Department (HOSPITAL_COMMUNITY): Payer: Medicaid Other

## 2015-11-19 ENCOUNTER — Encounter (HOSPITAL_COMMUNITY): Payer: Self-pay | Admitting: Vascular Surgery

## 2015-11-19 ENCOUNTER — Emergency Department (HOSPITAL_COMMUNITY)
Admission: EM | Admit: 2015-11-19 | Discharge: 2015-11-20 | Disposition: A | Discharge status: Home / Self Care | Payer: Medicaid Other | Attending: Emergency Medicine | Admitting: Emergency Medicine

## 2015-11-19 DIAGNOSIS — K439 Ventral hernia without obstruction or gangrene: Secondary | ICD-10-CM | POA: Insufficient documentation

## 2015-11-19 DIAGNOSIS — F419 Anxiety disorder, unspecified: Secondary | ICD-10-CM | POA: Diagnosis not present

## 2015-11-19 DIAGNOSIS — Z7952 Long term (current) use of systemic steroids: Secondary | ICD-10-CM | POA: Insufficient documentation

## 2015-11-19 DIAGNOSIS — M1990 Unspecified osteoarthritis, unspecified site: Secondary | ICD-10-CM | POA: Insufficient documentation

## 2015-11-19 DIAGNOSIS — Z791 Long term (current) use of non-steroidal anti-inflammatories (NSAID): Secondary | ICD-10-CM | POA: Insufficient documentation

## 2015-11-19 DIAGNOSIS — R109 Unspecified abdominal pain: Secondary | ICD-10-CM

## 2015-11-19 DIAGNOSIS — Z79899 Other long term (current) drug therapy: Secondary | ICD-10-CM | POA: Diagnosis not present

## 2015-11-19 DIAGNOSIS — J449 Chronic obstructive pulmonary disease, unspecified: Secondary | ICD-10-CM | POA: Diagnosis not present

## 2015-11-19 DIAGNOSIS — Z880 Allergy status to penicillin: Secondary | ICD-10-CM | POA: Diagnosis not present

## 2015-11-19 DIAGNOSIS — I10 Essential (primary) hypertension: Secondary | ICD-10-CM | POA: Diagnosis not present

## 2015-11-19 DIAGNOSIS — M19041 Primary osteoarthritis, right hand: Secondary | ICD-10-CM

## 2015-11-19 DIAGNOSIS — M19042 Primary osteoarthritis, left hand: Secondary | ICD-10-CM

## 2015-11-19 DIAGNOSIS — F17210 Nicotine dependence, cigarettes, uncomplicated: Secondary | ICD-10-CM | POA: Diagnosis not present

## 2015-11-19 DIAGNOSIS — R1084 Generalized abdominal pain: Secondary | ICD-10-CM | POA: Diagnosis present

## 2015-11-19 LAB — URINALYSIS, ROUTINE W REFLEX MICROSCOPIC
Bilirubin Urine: NEGATIVE
GLUCOSE, UA: NEGATIVE mg/dL
Hgb urine dipstick: NEGATIVE
Ketones, ur: 15 mg/dL — AB
LEUKOCYTES UA: NEGATIVE
Nitrite: NEGATIVE
PH: 6 (ref 5.0–8.0)
PROTEIN: NEGATIVE mg/dL
SPECIFIC GRAVITY, URINE: 1.027 (ref 1.005–1.030)

## 2015-11-19 LAB — COMPREHENSIVE METABOLIC PANEL
ALBUMIN: 4.1 g/dL (ref 3.5–5.0)
ALT: 15 U/L (ref 14–54)
ANION GAP: 8 (ref 5–15)
AST: 19 U/L (ref 15–41)
Alkaline Phosphatase: 61 U/L (ref 38–126)
BUN: 9 mg/dL (ref 6–20)
CHLORIDE: 100 mmol/L — AB (ref 101–111)
CO2: 29 mmol/L (ref 22–32)
Calcium: 9.7 mg/dL (ref 8.9–10.3)
Creatinine, Ser: 0.76 mg/dL (ref 0.44–1.00)
Glucose, Bld: 101 mg/dL — ABNORMAL HIGH (ref 65–99)
POTASSIUM: 3.3 mmol/L — AB (ref 3.5–5.1)
SODIUM: 137 mmol/L (ref 135–145)
Total Bilirubin: 0.3 mg/dL (ref 0.3–1.2)
Total Protein: 6.4 g/dL — ABNORMAL LOW (ref 6.5–8.1)

## 2015-11-19 LAB — CBC
HEMATOCRIT: 43.1 % (ref 36.0–46.0)
HEMOGLOBIN: 14.3 g/dL (ref 12.0–15.0)
MCH: 28.9 pg (ref 26.0–34.0)
MCHC: 33.2 g/dL (ref 30.0–36.0)
MCV: 87.2 fL (ref 78.0–100.0)
PLATELETS: 336 10*3/uL (ref 150–400)
RBC: 4.94 MIL/uL (ref 3.87–5.11)
RDW: 14.4 % (ref 11.5–15.5)
WBC: 9.8 10*3/uL (ref 4.0–10.5)

## 2015-11-19 LAB — LIPASE, BLOOD: Lipase: 22 U/L (ref 11–51)

## 2015-11-19 MED ORDER — TRAMADOL HCL 50 MG PO TABS: 50.0000 mg | ORAL_TABLET | Freq: Three times a day (TID) | ORAL | Status: DC | PRN

## 2015-11-19 NOTE — ED Notes (Signed)
EDP at bedside  

## 2015-11-19 NOTE — ED Notes (Signed)
Pt sis requesting that a tetanus shot, due to previous injuries.

## 2015-11-19 NOTE — ED Provider Notes (Signed)
CSN: [id number]     Arrival date & time 11/19/15  1803 History   First MD Initiated Contact with Patient 11/19/15 2139     Chief Complaint  Patient presents with  . Abdominal Pain     (Consider location/radiation/quality/duration/timing/severity/associated sxs/prior Treatment) HPI Comments: This a 55 year old female with a history of ventral hernia who presents with 3 days of abdominal pain.  Has not taken any medication for her discomfort.  Denies any nausea, vomiting, diarrhea, dysuria, but states that her urine does have an odor.  Walking makes the pain worse. She had several abdominal surgeries and is status post colostomy takedown.-This was a result of a perforated bowel after colonoscopy and polyp removal.  Patient is a 55 y.o. female presenting with abdominal pain. The history is provided by the patient.  Abdominal Pain Pain location:  Generalized Pain quality: aching   Pain radiates to:  Does not radiate Pain severity:  Mild Onset quality:  Gradual Duration:  3 days Progression:  Worsening Chronicity:  Recurrent Associated symptoms: no chills, no constipation, no diarrhea, no dysuria, no fever, no nausea, no shortness of breath and no vomiting     Past Medical History  Diagnosis Date  . Arthritis   . COPD (chronic obstructive pulmonary disease) (HCC)   . Hypertension   . Generalized headaches   . Leg swelling   . Abdominal pain   . Anxiety   . Hernia, inguinal, right    Past Surgical History  Procedure Laterality Date  . Hernia repair  2010  . Colon surgery  2010    per patient "colon take down"  . Colostomy  2010  . Tubal ligation     Family History  Problem Relation Age of Onset  . Heart disease Mother 5756  . Heart failure Mother   . Asthma Mother   . Alzheimer's disease Father 2967  . Colon cancer Neg Hx   . Emphysema Mother   . Emphysema Brother   . Asthma Brother    Social History  Substance Use Topics  . Smoking status: Current Every Day Smoker --  0.50 packs/day for 40 years    Types: Cigarettes  . Smokeless tobacco: Never Used     Comment: trying to quit, down to 2cigs/daily  . Alcohol Use: No   OB History    No data available     Review of Systems  Constitutional: Negative for fever and chills.  Respiratory: Negative for shortness of breath.   Gastrointestinal: Positive for abdominal pain. Negative for nausea, vomiting, diarrhea and constipation.  Genitourinary: Negative for dysuria and frequency.  All other systems reviewed and are negative.     Allergies  Aspirin; Ciprofloxacin; Codeine; Eggs or egg-derived products; and Penicillins  Home Medications   Prior to Admission medications   Medication Sig Start Date End Date Taking? Authorizing Provider  Cholecalciferol (VITAMIN D3) 2000 UNITS TABS Take 2,000 Units by mouth daily. 02/10/15  Yes [name], MD  cyclobenzaprine (FLEXERIL) 10 MG tablet Take 1 tablet (10 mg total) by mouth at bedtime. Patient taking differently: Take 10 mg by mouth at bedtime as needed for muscle spasms.  05/22/15  Yes [name], MD  hydrochlorothiazide (HYDRODIURIL) 25 MG tablet Take 1 tablet (25 mg total) by mouth daily. 09/08/15  Yes [name], MD  hydrOXYzine (ATARAX/VISTARIL) 10 MG tablet Take 10 mg by mouth 3 (three) times daily as needed.   Yes Historical Provider, MD  ibuprofen (ADVIL,MOTRIN) 600 MG tablet Take 1 tablet (600 mg  total) by mouth every 8 (eight) hours as needed. 02/06/15  Yes Dessa Phi, MD  Multiple Vitamin (MULTIVITAMIN) tablet Take 1 tablet by mouth daily.   Yes Historical Provider, MD  nystatin cream (MYCOSTATIN) Apply 1 application topically 2 (two) times daily. 09/08/15  Yes [name], MD  ondansetron (ZOFRAN) 4 MG tablet Take 1 tablet (4 mg total) by mouth every 8 (eight) hours as needed for nausea or vomiting. 05/22/15  Yes [name], MD  PARoxetine (PAXIL) 40 MG tablet Take 40 mg by mouth every morning. 10/01/15  Yes Historical Provider,  MD  tiotropium (SPIRIVA HANDIHALER) 18 MCG inhalation capsule Place 1 capsule (18 mcg total) into inhaler and inhale daily. 10/22/15  Yes [name], MD  triamcinolone cream (KENALOG) 0.1 % Apply 1 application topically 2 (two) times daily. 09/08/15  Yes [name], MD  VENTOLIN HFA 108 (90 Base) MCG/ACT inhaler Inhale 2 puffs into the lungs every 4 (four) hours as needed. 10/06/15  Yes Historical Provider, MD  acyclovir (ZOVIRAX) 400 MG tablet Take 1 tablet (400 mg total) by mouth 3 (three) times daily. 10/13/15   [name], MD  Albuterol Sulfate (PROAIR RESPICLICK) 108 (90 BASE) MCG/ACT AEPB Inhale 2 puffs into the lungs every 6 (six) hours as needed (SOB). 06/03/15   [name], MD  ciclopirox (PENLAC) 8 % solution Apply topically at bedtime. Apply over nail and surrounding skin. File nail in between applications. 11/17/15   Asencion Islam, DPM  diclofenac sodium (VOLTAREN) 1 % GEL Apply 2 g topically 4 (four) times daily. 02/06/15   Dessa Phi, MD  dicyclomine (BENTYL) 20 MG tablet Take 1 tablet (20 mg total) by mouth every 6 (six) hours as needed for spasms (abdominal cramping). 05/22/15   [name], MD  PARoxetine (PAXIL) 20 MG tablet Take 2 tablets (40 mg total) by mouth every morning. 05/22/15   [name], MD  terbinafine (LAMISIL) 250 MG tablet Take 1 tablet (250 mg total) by mouth daily. 10/13/15   [name], MD  traMADol (ULTRAM) 50 MG tablet Take 1 tablet (50 mg total) by mouth every 8 (eight) hours as needed for moderate pain. 11/19/15   Earley Favor, NP   BP 110/73 mmHg  Pulse 53  Temp(Src) 98.8 F (37.1 C) (Oral)  Resp 16  SpO2 96% Physical Exam  Constitutional: She appears well-developed and well-nourished.  HENT:  Head: Normocephalic.  Neck: Normal range of motion.  Cardiovascular: Normal rate and regular rhythm.   Pulmonary/Chest: Effort normal and breath sounds normal.  Abdominal: Soft. She exhibits distension. There is no tenderness. A hernia  is present. Hernia confirmed positive in the ventral area.    Patient has a very large ventral hernia.  Well-healed surgical scar midline.  The hernia is easily reduced, but does not stay in place  Nursing note and vitals reviewed.   ED Course  Procedures (including critical care time) Labs Review Labs Reviewed  COMPREHENSIVE METABOLIC PANEL - Abnormal; Notable for the following:    Potassium 3.3 (*)    Chloride 100 (*)    Glucose, Bld 101 (*)    Total Protein 6.4 (*)    All other components within normal limits  URINALYSIS, ROUTINE W REFLEX MICROSCOPIC (NOT AT [facility]) - Abnormal; Notable for the following:    Ketones, ur 15 (*)    All other components within normal limits  LIPASE, BLOOD  CBC    Imaging Review Dg Abd Acute W/chest  11/19/2015  CLINICAL DATA:  Abdominal pain.  Ventral  hernia. EXAM: DG ABDOMEN ACUTE W/ 1V CHEST COMPARISON:  12/31/2014 FINDINGS: Chronic hyperinflation with mild scarring or atelectasis at the bases. There is no edema, consolidation, effusion, or pneumothorax. Indistinct density overlapping the anterior right fourth rib is not convincing for true nodule. This area was seen on 12/31/2014 acute abdominal series and was clear. No cardiomegaly. Bowel projects below the bony pelvis correlating with a large ventral hernia. No signs of bowel obstruction or perforation. No concerning intra-abdominal mass effect or calcification. IMPRESSION: 1. Ventral hernia without signs of obstruction or perforation. 2. COPD. Electronically Signed   By: Marnee Spring M.D.   On: 11/19/2015 23:12   I have personally reviewed and evaluated these images and lab results as part of my medical decision-making.   EKG Interpretation None      MDM   Final diagnoses:  Ventral hernia without obstruction or gangrene         Earley Favor, NP 11/19/15 1610  Rolan Bucco, MD 11/19/15 2345

## 2015-11-19 NOTE — ED Notes (Signed)
Pt reports to the ED for eval of lower abd pain x 3 days. Pt denies any N/V/D. She reports some urinary urgency and an odor but denies any dysuria. Pt reports walking makes the pain worse. Pt A&Ox4, resp e/u, and skin warm and dry.

## 2015-11-19 NOTE — Discharge Instructions (Signed)
You can try wearing an abdominal support to decrease the discomfort of your protruding hernia until it can be repaired.  If it becomes hard, red and not easily "pushed back into place" than return for immediate evaluation  Your labs, urine and x-ray are all normal.  Please make an appointment with your primary care physician for continued medical care   Ventral Hernia A ventral hernia (also called an incisional hernia) is a hernia that occurs at the site of a previous surgical cut (incision) in the abdomen. The abdominal wall spans from your lower chest down to your pelvis. If the abdominal wall is weakened from a surgical incision, a hernia can occur. A hernia is a bulge of bowel or muscle tissue pushing out on the weakened part of the abdominal wall. Ventral hernias can get bigger from straining or lifting. Obese and older people are at higher risk for a ventral hernia. People who develop infections after surgery or require repeat incisions at the same site on the abdomen are also at increased risk. CAUSES  A ventral hernia occurs because of weakness in the abdominal wall at an incision site.  SYMPTOMS  Common symptoms include:  A visible bulge or lump on the abdominal wall.  Pain or tenderness around the lump.  Increased discomfort if you cough or make a sudden movement. If the hernia has blocked part of the intestine, a serious complication can occur (incarcerated or strangulated hernia). This can become a problem that requires emergency surgery because the blood flow to the blocked intestine may be cut off. Symptoms may include:  Feeling sick to your stomach (nauseous).  Throwing up (vomiting).  Stomach swelling (distention) or bloating.  Fever.  Rapid heartbeat. DIAGNOSIS  Your health care provider will take a medical history and perform a physical exam. Various tests may be ordered, such as:  Blood tests.  Urine tests.  Ultrasonography.  X-rays.  Computed tomography  (CT). TREATMENT  Watchful waiting may be all that is needed for a smaller hernia that does not cause symptoms. Your health care provider may recommend the use of a supportive belt (truss) that helps to keep the abdominal wall intact. For larger hernias or those that cause pain, surgery to repair the hernia is usually recommended. If a hernia becomes strangulated, emergency surgery needs to be done right away. HOME CARE INSTRUCTIONS  Avoid putting pressure or strain on the abdominal area.  Avoid heavy lifting.  Use good body positioning for physical tasks. Ask your health care provider about proper body positioning.  Use a supportive belt as directed by your health care provider.  Maintain a healthy weight.  Eat foods that are high in fiber, such as whole grains, fruits, and vegetables. Fiber helps prevent difficult bowel movements (constipation).  Drink enough fluids to keep your urine clear or pale yellow.  Follow up with your health care provider as directed. SEEK MEDICAL CARE IF:   Your hernia seems to be getting larger or more painful. SEEK IMMEDIATE MEDICAL CARE IF:   You have abdominal pain that is sudden and sharp.  Your pain becomes severe.  You have repeated vomiting.  You are sweating a lot.  You notice a rapid heartbeat.  You develop a fever. MAKE SURE YOU:   Understand these instructions.  Will watch your condition.  Will get help right away if you are not doing well or get worse.   This information is not intended to replace advice given to you by your health care  provider. Make sure you discuss any questions you have with your health care provider.   Document Released: 05/17/2012 Document Revised: 06/21/2014 Document Reviewed: 05/17/2012 Elsevier Interactive Patient Education [employer]2016 [employer]vier Inc.

## 2015-11-20 NOTE — ED Notes (Signed)
Pt departed in NAD.  

## 2015-11-20 NOTE — Progress Notes (Signed)
Orthopedic Tech Progress Note Patient Details:  Maria Oliver 07-10-1960 [id number]  Ortho Devices Type of Ortho Device: Abdominal binder Ortho Device/Splint Interventions: Ordered, Application   Maria Oliver, Maria Oliver 11/20/2015, 12:23 AM

## 2015-11-26 MED FILL — TERBINAFINE HCL 250 MG TAB: 250 | 30 days supply | Qty: 30 | Fill #1

## 2015-12-17 MED FILL — HYDROCHLOROTHIAZIDE 25 MG T: 25 | 30 days supply | Qty: 30 | Fill #2

## 2015-12-25 ENCOUNTER — Encounter: Payer: Self-pay | Attending: Physical Medicine & Rehabilitation | Admitting: Physical Medicine & Rehabilitation

## 2016-01-05 ENCOUNTER — Telehealth: Payer: Self-pay | Admitting: Family Medicine

## 2016-01-05 NOTE — Telephone Encounter (Signed)
Patient is needing a medication for sinuses. Please follow up.

## 2016-01-06 NOTE — Telephone Encounter (Signed)
Will forward to pcp

## 2016-01-06 NOTE — Telephone Encounter (Signed)
Advise OTC medication like zyrtec and nasal saline She will need OV for any Rx, like antibiotic if she thinks she needs one She does not have to wait for me to have an opening this cane be a same day provider visit

## 2016-01-07 ENCOUNTER — Telehealth: Payer: Self-pay

## 2016-01-07 NOTE — Telephone Encounter (Signed)
Patient was called on 01/07/2016 @ 2:10. Patient was informed of OTC medication and was told to follow up with provider if OV is needed.

## 2016-01-13 ENCOUNTER — Encounter (HOSPITAL_COMMUNITY): Payer: Self-pay | Admitting: Emergency Medicine

## 2016-01-13 ENCOUNTER — Emergency Department (HOSPITAL_COMMUNITY)
Admission: EM | Admit: 2016-01-13 | Discharge: 2016-01-14 | Disposition: A | Discharge status: Home / Self Care | Payer: Medicare Other | Attending: Emergency Medicine | Admitting: Emergency Medicine

## 2016-01-13 DIAGNOSIS — I10 Essential (primary) hypertension: Secondary | ICD-10-CM | POA: Diagnosis not present

## 2016-01-13 DIAGNOSIS — F17210 Nicotine dependence, cigarettes, uncomplicated: Secondary | ICD-10-CM | POA: Diagnosis not present

## 2016-01-13 DIAGNOSIS — R1030 Lower abdominal pain, unspecified: Secondary | ICD-10-CM | POA: Diagnosis present

## 2016-01-13 DIAGNOSIS — K439 Ventral hernia without obstruction or gangrene: Secondary | ICD-10-CM | POA: Diagnosis not present

## 2016-01-13 DIAGNOSIS — R1084 Generalized abdominal pain: Secondary | ICD-10-CM

## 2016-01-13 DIAGNOSIS — G8929 Other chronic pain: Secondary | ICD-10-CM

## 2016-01-13 DIAGNOSIS — J449 Chronic obstructive pulmonary disease, unspecified: Secondary | ICD-10-CM | POA: Insufficient documentation

## 2016-01-13 DIAGNOSIS — R109 Unspecified abdominal pain: Secondary | ICD-10-CM

## 2016-01-13 LAB — CBC
HCT: 41.2 % (ref 36.0–46.0)
Hemoglobin: 13.4 g/dL (ref 12.0–15.0)
MCH: 29.1 pg (ref 26.0–34.0)
MCHC: 32.5 g/dL (ref 30.0–36.0)
MCV: 89.4 fL (ref 78.0–100.0)
PLATELETS: 296 10*3/uL (ref 150–400)
RBC: 4.61 MIL/uL (ref 3.87–5.11)
RDW: 14.1 % (ref 11.5–15.5)
WBC: 8.2 10*3/uL (ref 4.0–10.5)

## 2016-01-13 LAB — COMPREHENSIVE METABOLIC PANEL
ALK PHOS: 60 U/L (ref 38–126)
ALT: 14 U/L (ref 14–54)
AST: 19 U/L (ref 15–41)
Albumin: 3.7 g/dL (ref 3.5–5.0)
Anion gap: 6 (ref 5–15)
CALCIUM: 9.1 mg/dL (ref 8.9–10.3)
CO2: 29 mmol/L (ref 22–32)
CREATININE: 0.65 mg/dL (ref 0.44–1.00)
Chloride: 104 mmol/L (ref 101–111)
GFR calc non Af Amer: >60 mL/min (ref >60)
GLUCOSE: 111 mg/dL — AB (ref 65–99)
Potassium: 3 mmol/L — ABNORMAL LOW (ref 3.5–5.1)
SODIUM: 139 mmol/L (ref 135–145)
Total Bilirubin: <0.1 mg/dL — ABNORMAL LOW (ref 0.3–1.2)
Total Protein: 5.9 g/dL — ABNORMAL LOW (ref 6.5–8.1)

## 2016-01-13 LAB — URINALYSIS, ROUTINE W REFLEX MICROSCOPIC
BILIRUBIN URINE: NEGATIVE
Glucose, UA: NEGATIVE mg/dL
HGB URINE DIPSTICK: NEGATIVE
KETONES UR: NEGATIVE mg/dL
Nitrite: NEGATIVE
PROTEIN: NEGATIVE mg/dL
Specific Gravity, Urine: 1.02 (ref 1.005–1.030)
pH: 6.5 (ref 5.0–8.0)

## 2016-01-13 LAB — URINE MICROSCOPIC-ADD ON

## 2016-01-13 LAB — LIPASE, BLOOD: Lipase: 16 U/L (ref 11–51)

## 2016-01-13 MED ORDER — IOPAMIDOL (ISOVUE-300) INJECTION 61%
INTRAVENOUS | Status: AC
  Administered 2016-01-14: 100 mL
  Filled 2016-01-13: qty 100

## 2016-01-13 MED ORDER — SODIUM CHLORIDE 0.9 % IV SOLN
INTRAVENOUS | Status: DC
  Administered 2016-01-13: 22:00:00 via INTRAVENOUS

## 2016-01-13 MED ORDER — ONDANSETRON HCL 4 MG/2ML IJ SOLN
4.0000 mg | Freq: Once | INTRAMUSCULAR | Status: AC
  Administered 2016-01-13: 4 mg via INTRAVENOUS
  Filled 2016-01-13: qty 2

## 2016-01-13 MED ORDER — MORPHINE SULFATE (PF) 4 MG/ML IV SOLN
4.0000 mg | Freq: Once | INTRAVENOUS | Status: AC
  Administered 2016-01-13: 4 mg via INTRAVENOUS
  Filled 2016-01-13: qty 1

## 2016-01-13 NOTE — ED Provider Notes (Signed)
[facility]-EMERGENCY DEPT Provider Note   CSN: [id number] Arrival date & time: 01/13/16  1651  First Provider Contact:  First MD Initiated Contact with Patient 01/13/16 2029        History   Chief Complaint Chief Complaint  Patient presents with  . Abdominal Pain    HPI Maria Oliver is a 55 y.o. female.  HPI Patient poor she's had ongoing lower abdominal pain and lower back pain. The lower back pain radiates into the groin. He has had occasionally variable diarrhea and constipation. No vomiting. Patient does have a known ventral hernia. She reports the pain has seemed more severe over the past week. She reports increased fatigue as well. No pain burning or urgency.  Past Medical History:  Diagnosis Date  . Abdominal pain   . Anxiety   . Arthritis   . COPD (chronic obstructive pulmonary disease) (HCC)   . Generalized headaches   . Hernia, inguinal, right   . Hypertension   . Leg swelling     Patient Active Problem List   Diagnosis Date Noted  . Onychomycosis of right great toe 10/13/2015  . Cold sore 10/13/2015  . Osteoarthritis 08/11/2015  . Bilateral flank pain 08/11/2015  . Chronic abdominal pain 05/22/2015  . Chronic pain syndrome 05/22/2015  . Vitamin D insufficiency 02/06/2015  . Chronic knee pain 02/06/2015  . Ventral hernia without obstruction or gangrene 12/31/2014  . Anxiety 12/31/2014  . COPD (chronic obstructive pulmonary disease) with emphysema (HCC)   . SMOKER 10/01/2009  . Essential hypertension 10/01/2009    Past Surgical History:  Procedure Laterality Date  . COLON SURGERY  2010   per patient "colon take down"  . COLOSTOMY  2010  . HERNIA REPAIR  2010  . TUBAL LIGATION      OB History    No data available       Home Medications    Prior to Admission medications   Medication Sig Start Date End Date Taking? Authorizing Provider  Albuterol Sulfate (PROAIR RESPICLICK) 108 (90 BASE) MCG/ACT AEPB Inhale 2 puffs into the lungs every 6 (six)  hours as needed (SOB). 06/03/15  Yes [name], MD  Cholecalciferol (VITAMIN D3) 2000 UNITS TABS Take 2,000 Units by mouth daily. 02/10/15  Yes [name], MD  ciclopirox (PENLAC) 8 % solution Apply topically at bedtime. Apply over nail and surrounding skin. File nail in between applications. 11/17/15  Yes Titorya Stover, DPM  cyclobenzaprine (FLEXERIL) 10 MG tablet Take 1 tablet (10 mg total) by mouth at bedtime. Patient taking differently: Take 10 mg by mouth at bedtime as needed for muscle spasms.  05/22/15  Yes [name], MD  diclofenac sodium (VOLTAREN) 1 % GEL Apply 2 g topically 4 (four) times daily. Patient taking differently: Apply 2 g topically 4 (four) times daily as needed.  02/06/15  Yes [name], MD  dicyclomine (BENTYL) 20 MG tablet Take 1 tablet (20 mg total) by mouth every 6 (six) hours as needed for spasms (abdominal cramping). 05/22/15  Yes [name], MD  hydrochlorothiazide (HYDRODIURIL) 25 MG tablet Take 1 tablet (25 mg total) by mouth daily. 09/08/15  Yes [name], MD  hydrOXYzine (ATARAX/VISTARIL) 10 MG tablet Take 10 mg by mouth 3 (three) times daily as needed for itching.    Yes Historical Provider, MD  ibuprofen (ADVIL,MOTRIN) 200 MG tablet Take 200 mg by mouth every 6 (six) hours as needed for moderate pain.   Yes Historical Provider, MD  Multiple Vitamin (MULTIVITAMIN) tablet Take  1 tablet by mouth daily.   Yes Historical Provider, MD  nystatin cream (MYCOSTATIN) Apply 1 application topically 2 (two) times daily. 09/08/15  Yes [name], MD  PARoxetine (PAXIL) 20 MG tablet Take 2 tablets (40 mg total) by mouth every morning. 05/22/15  Yes [name], MD  PARoxetine (PAXIL) 40 MG tablet Take 40 mg by mouth every morning. 10/01/15  Yes Historical Provider, MD  terbinafine (LAMISIL) 250 MG tablet Take 1 tablet (250 mg total) by mouth daily. 10/13/15  Yes [name], MD  tiotropium (SPIRIVA HANDIHALER) 18 MCG inhalation capsule Place 1  capsule (18 mcg total) into inhaler and inhale daily. 10/22/15  Yes [name], MD  triamcinolone cream (KENALOG) 0.1 % Apply 1 application topically 2 (two) times daily. 09/08/15  Yes [name], MD  VENTOLIN HFA 108 (90 Base) MCG/ACT inhaler Inhale 2 puffs into the lungs every 4 (four) hours as needed for shortness of breath.  10/06/15  Yes Historical Provider, MD  acyclovir (ZOVIRAX) 400 MG tablet Take 1 tablet (400 mg total) by mouth 3 (three) times daily. Patient not taking: Reported on 01/13/2016 10/13/15   Dessa Phi, MD  ibuprofen (ADVIL,MOTRIN) 600 MG tablet Take 1 tablet (600 mg total) by mouth every 8 (eight) hours as needed. Patient not taking: Reported on 01/13/2016 02/06/15   Dessa Phi, MD  ondansetron (ZOFRAN) 4 MG tablet Take 1 tablet (4 mg total) by mouth every 8 (eight) hours as needed for nausea or vomiting. Patient not taking: Reported on 01/13/2016 05/22/15   Dessa Phi, MD  traMADol (ULTRAM) 50 MG tablet Take 1 tablet (50 mg total) by mouth every 8 (eight) hours as needed for moderate pain. Patient not taking: Reported on 01/13/2016 11/19/15   Earley Favor, NP    Family History Family History  Problem Relation Age of Onset  . Heart disease Mother 65  . Heart failure Mother   . Asthma Mother   . Alzheimer's disease Father 51  . Colon cancer Neg Hx   . Emphysema Mother   . Emphysema Brother   . Asthma Brother     Social History Social History  Substance Use Topics  . Smoking status: Current Every Day Smoker    Packs/day: 0.50    Years: 40.00    Types: Cigarettes  . Smokeless tobacco: Never Used     Comment: trying to quit, down to 2cigs/daily  . Alcohol use No     Allergies   Aspirin; Ciprofloxacin; Codeine; Eggs or egg-derived products; and Penicillins   Review of Systems Review of Systems 10 Systems reviewed and are negative for acute change except as noted in the HPI.   Physical Exam Updated Vital Signs BP 138/81   Pulse (!) 53   Temp  98.3 F (36.8 C)   Resp 11   SpO2 97%   Physical Exam  Constitutional: She is oriented to person, place, and time. She appears well-developed and well-nourished. No distress.  HENT:  Head: Normocephalic and atraumatic.  Eyes: EOM are normal. No scleral icterus.  Cardiovascular: Normal rate, regular rhythm, normal heart sounds and intact distal pulses.   Pulmonary/Chest: Effort normal and breath sounds normal.  Abdominal: Soft. Bowel sounds are normal.  Patient has large lower ventral hernia. This is soft. No guarding or rebound. Angle canals are clear. 2+ femoral pulses symmetric.  Musculoskeletal: Normal range of motion. She exhibits no edema or tenderness.  Neurological: She is alert and oriented to person, place, and time. No cranial nerve deficit. She  exhibits normal muscle tone. Coordination normal.  Skin: Skin is warm and dry.  Psychiatric: She has a normal mood and affect.     ED Treatments / Results  Labs (all labs ordered are listed, but only abnormal results are displayed) Labs Reviewed  COMPREHENSIVE METABOLIC PANEL - Abnormal; Notable for the following:       Result Value   Potassium 3.0 (*)    Glucose, Bld 111 (*)    BUN <5 (*)    Total Protein 5.9 (*)    Total Bilirubin <0.1 (*)    All other components within normal limits  URINALYSIS, ROUTINE W REFLEX MICROSCOPIC (NOT AT [facility]) - Abnormal; Notable for the following:    APPearance CLOUDY (*)    Leukocytes, UA TRACE (*)    All other components within normal limits  URINE MICROSCOPIC-ADD ON - Abnormal; Notable for the following:    Squamous Epithelial / LPF 6-30 (*)    Bacteria, UA RARE (*)    All other components within normal limits  LIPASE, BLOOD  CBC    EKG  EKG Interpretation None       Radiology No results found.  Procedures Procedures (including critical care time)  Medications Ordered in ED Medications  0.9 %  sodium chloride infusion ( Intravenous New Bag/Given 01/13/16 2141)  iopamidol  (ISOVUE-300) 61 % injection (not administered)  morphine 4 MG/ML injection 4 mg (4 mg Intravenous Given 01/13/16 2141)  ondansetron (ZOFRAN) injection 4 mg (4 mg Intravenous Given 01/13/16 2141)     Initial Impression / Assessment and Plan / ED Course  I have reviewed the triage vital signs and the nursing notes.  Pertinent labs & imaging results that were available during my care of the patient were reviewed by me and considered in my medical decision making (see chart for details).  Clinical Course     Final Clinical Impressions(s) / ED Diagnoses   Final diagnoses:  Generalized abdominal pain   Patient with generalized abdominal pain and low back pain. At this time, CT is pending. CT has no acute findings, I do feel patient is safe for discharge and pain medication and follow up with her primary care provider. She is clinically alert and well without signs of acute infectious illness. She is neurologically intact with clear mental status and no respiratory symptoms. New Prescriptions New Prescriptions   No medications on file     Arby Barrette, MD 01/14/16 [phone]

## 2016-01-13 NOTE — ED Triage Notes (Signed)
Pt sts lower abd pain and back pain; noted to have large abd hernia; pt sts pain x 2 weeks

## 2016-01-14 ENCOUNTER — Emergency Department (HOSPITAL_COMMUNITY): Payer: Medicare Other

## 2016-01-14 ENCOUNTER — Encounter (HOSPITAL_COMMUNITY): Payer: Self-pay | Admitting: Radiology

## 2016-01-14 DIAGNOSIS — R1084 Generalized abdominal pain: Secondary | ICD-10-CM | POA: Diagnosis not present

## 2016-01-14 MED ORDER — ONDANSETRON HCL 4 MG PO TABS: 4.0000 mg | ORAL_TABLET | Freq: Three times a day (TID) | ORAL | 0 refills | Status: DC | PRN

## 2016-01-14 MED ORDER — HYDROCODONE-ACETAMINOPHEN 5-325 MG PO TABS: 1.0000 | ORAL_TABLET | ORAL | 0 refills | Status: DC | PRN

## 2016-01-14 NOTE — ED Notes (Signed)
Pt states that she will not wear the BP cuff at this time.  Will continue to closely monitor pt.

## 2016-01-14 NOTE — ED Notes (Signed)
Pt returned from CT at this time via ED stretcher. Pt in no apparent distress at this time. Will continue to closely monitor pt.   

## 2016-01-14 NOTE — Discharge Instructions (Signed)
Your hernia has increased in size, there is no obstruction seen please make an appointment with your PCP for follow up to discuss referral to [facility] surgery for repair

## 2016-01-14 NOTE — ED Notes (Signed)
Patient transported to CT 

## 2016-01-14 NOTE — ED Notes (Signed)
Pt provided with d/c instructions at this time. Pt verbalized understanding of d/c instructions at this time. Pt provided with RX for zofran and norco at time of d/c. Pt verbalizes understanding of RX directions at time of d/c. Pt in no apparent distress at this time.

## 2016-01-16 MED FILL — PARoxetine HCL 30 MG TABS: 30 | 30 days supply | Qty: 30 | Fill #0

## 2016-01-16 MED FILL — HYDROCHLOROTHIAZIDE 25 MG T: 25 | 30 days supply | Qty: 30 | Fill #3

## 2016-02-18 MED FILL — HYDROCHLOROTHIAZIDE 25 MG T: 25 | 30 days supply | Qty: 30 | Fill #4

## 2016-02-18 MED FILL — PARoxetine HCL 30 MG TABS: 30 | 30 days supply | Qty: 30 | Fill #1

## 2016-03-18 ENCOUNTER — Emergency Department (HOSPITAL_COMMUNITY)
Admission: EM | Admit: 2016-03-18 | Discharge: 2016-03-19 | Disposition: A | Discharge status: Home / Self Care | Payer: Medicare Other | Attending: Emergency Medicine | Admitting: Emergency Medicine

## 2016-03-18 ENCOUNTER — Encounter (HOSPITAL_COMMUNITY): Payer: Self-pay

## 2016-03-18 ENCOUNTER — Emergency Department (HOSPITAL_COMMUNITY): Payer: Medicare Other

## 2016-03-18 DIAGNOSIS — J441 Chronic obstructive pulmonary disease with (acute) exacerbation: Secondary | ICD-10-CM

## 2016-03-18 DIAGNOSIS — R0602 Shortness of breath: Secondary | ICD-10-CM | POA: Diagnosis not present

## 2016-03-18 DIAGNOSIS — F17210 Nicotine dependence, cigarettes, uncomplicated: Secondary | ICD-10-CM | POA: Insufficient documentation

## 2016-03-18 DIAGNOSIS — I10 Essential (primary) hypertension: Secondary | ICD-10-CM | POA: Insufficient documentation

## 2016-03-18 LAB — BASIC METABOLIC PANEL
Anion gap: 12 (ref 5–15)
BUN: 9 mg/dL (ref 6–20)
CHLORIDE: 96 mmol/L — AB (ref 101–111)
CO2: 32 mmol/L (ref 22–32)
Calcium: 10.1 mg/dL (ref 8.9–10.3)
Creatinine, Ser: 0.77 mg/dL (ref 0.44–1.00)
GFR calc Af Amer: >60 mL/min (ref >60)
GFR calc non Af Amer: >60 mL/min (ref >60)
GLUCOSE: 128 mg/dL — AB (ref 65–99)
POTASSIUM: 3.2 mmol/L — AB (ref 3.5–5.1)
Sodium: 140 mmol/L (ref 135–145)

## 2016-03-18 LAB — CBC WITH DIFFERENTIAL/PLATELET
Basophils Absolute: 0.1 10*3/uL (ref 0.0–0.1)
Basophils Relative: 1 %
EOS PCT: 7 %
Eosinophils Absolute: 0.7 10*3/uL (ref 0.0–0.7)
HCT: 45.7 % (ref 36.0–46.0)
HEMOGLOBIN: 15.2 g/dL — AB (ref 12.0–15.0)
LYMPHS ABS: 3.2 10*3/uL (ref 0.7–4.0)
LYMPHS PCT: 31 %
MCH: 29.6 pg (ref 26.0–34.0)
MCHC: 33.3 g/dL (ref 30.0–36.0)
MCV: 89.1 fL (ref 78.0–100.0)
Monocytes Absolute: 0.8 10*3/uL (ref 0.1–1.0)
Monocytes Relative: 7 %
NEUTROS PCT: 54 %
Neutro Abs: 5.7 10*3/uL (ref 1.7–7.7)
Platelets: 362 10*3/uL (ref 150–400)
RBC: 5.13 MIL/uL — AB (ref 3.87–5.11)
RDW: 14 % (ref 11.5–15.5)
WBC: 10.4 10*3/uL (ref 4.0–10.5)

## 2016-03-18 MED ORDER — POTASSIUM CHLORIDE CRYS ER 20 MEQ PO TBCR
40.0000 meq | EXTENDED_RELEASE_TABLET | Freq: Once | ORAL | Status: AC
  Administered 2016-03-18: 40 meq via ORAL
  Filled 2016-03-18: qty 2

## 2016-03-18 MED ORDER — IPRATROPIUM BROMIDE 0.02 % IN SOLN
0.5000 mg | Freq: Once | RESPIRATORY_TRACT | Status: AC
  Administered 2016-03-18: 0.5 mg via RESPIRATORY_TRACT
  Filled 2016-03-18: qty 2.5

## 2016-03-18 MED ORDER — ALBUTEROL SULFATE (2.5 MG/3ML) 0.083% IN NEBU
INHALATION_SOLUTION | RESPIRATORY_TRACT | Status: AC
  Administered 2016-03-18: 2.5 mg
  Filled 2016-03-18: qty 3

## 2016-03-18 MED ORDER — ALBUTEROL (5 MG/ML) CONTINUOUS INHALATION SOLN
10.0000 mg/h | INHALATION_SOLUTION | Freq: Once | RESPIRATORY_TRACT | Status: AC
  Administered 2016-03-18: 10 mg/h via RESPIRATORY_TRACT
  Filled 2016-03-18: qty 20

## 2016-03-18 MED ORDER — MAGNESIUM SULFATE 2 GM/50ML IV SOLN
2.0000 g | Freq: Once | INTRAVENOUS | Status: AC
  Administered 2016-03-18: 2 g via INTRAVENOUS
  Filled 2016-03-18: qty 50

## 2016-03-18 MED ORDER — PREDNISONE 20 MG PO TABS: 40.0000 mg | ORAL_TABLET | Freq: Every day | ORAL | Status: DC

## 2016-03-18 MED ORDER — AZITHROMYCIN 250 MG PO TABS
500.0000 mg | ORAL_TABLET | Freq: Once | ORAL | Status: AC
  Administered 2016-03-18: 500 mg via ORAL
  Filled 2016-03-18: qty 2

## 2016-03-18 MED ORDER — ALBUTEROL SULFATE (2.5 MG/3ML) 0.083% IN NEBU: 5.0000 mg | INHALATION_SOLUTION | Freq: Once | RESPIRATORY_TRACT | Status: DC

## 2016-03-18 NOTE — ED Triage Notes (Signed)
Pt states that for the past two days she has been feeling SOB, with a dry cough. Unrelieved by rescue inhaler. Hx of asthma and COPD

## 2016-03-19 DIAGNOSIS — J441 Chronic obstructive pulmonary disease with (acute) exacerbation: Secondary | ICD-10-CM | POA: Diagnosis not present

## 2016-03-19 MED ORDER — ALBUTEROL SULFATE HFA 108 (90 BASE) MCG/ACT IN AERS
2.0000 | INHALATION_SPRAY | Freq: Once | RESPIRATORY_TRACT | Status: AC
  Administered 2016-03-19: 2 via RESPIRATORY_TRACT
  Filled 2016-03-19: qty 6.7

## 2016-03-19 MED ORDER — POTASSIUM CHLORIDE CRYS ER 20 MEQ PO TBCR: 40.0000 meq | EXTENDED_RELEASE_TABLET | Freq: Two times a day (BID) | ORAL | 0 refills | Status: DC

## 2016-03-19 MED ORDER — ALBUTEROL SULFATE HFA 108 (90 BASE) MCG/ACT IN AERS: 2.0000 | INHALATION_SPRAY | RESPIRATORY_TRACT | 0 refills | Status: DC | PRN

## 2016-03-19 MED ORDER — PREDNISONE 20 MG PO TABS: ORAL_TABLET | ORAL | 0 refills | Status: DC

## 2016-03-19 MED ORDER — AZITHROMYCIN 250 MG PO TABS: 250.0000 mg | ORAL_TABLET | Freq: Every day | ORAL | 0 refills | Status: DC

## 2016-03-19 NOTE — ED Notes (Signed)
Pt sowing NAD. RR even and unlabored. Voices no questions/concerns at this time.

## 2016-03-19 NOTE — ED Provider Notes (Addendum)
[facility]ENCY DEPT Provider Note   CSN: [id number] Arrival date & time: 03/18/16  1900     History   Chief Complaint Chief Complaint  Patient presents with  . Shortness of Breath    HPI Maria Oliver is a 55 y.o. female.   Shortness of Breath  This is a recurrent problem. The problem has not changed since onset.Associated symptoms include cough. Pertinent negatives include no fever, no ear pain, no orthopnea and no rash. She has tried nothing for the symptoms.    Past Medical History:  Diagnosis Date  . Abdominal pain   . Anxiety   . Arthritis   . COPD (chronic obstructive pulmonary disease) (HCC)   . Generalized headaches   . Hernia, inguinal, right   . Hypertension   . Leg swelling     Patient Active Problem List   Diagnosis Date Noted  . Onychomycosis of right great toe 10/13/2015  . Cold sore 10/13/2015  . Osteoarthritis 08/11/2015  . Bilateral flank pain 08/11/2015  . Chronic abdominal pain 05/22/2015  . Chronic pain syndrome 05/22/2015  . Vitamin D insufficiency 02/06/2015  . Chronic knee pain 02/06/2015  . Ventral hernia without obstruction or gangrene 12/31/2014  . Anxiety 12/31/2014  . COPD (chronic obstructive pulmonary disease) with emphysema (HCC)   . SMOKER 10/01/2009  . Essential hypertension 10/01/2009    Past Surgical History:  Procedure Laterality Date  . COLON SURGERY  2010   per patient "colon take down"  . COLOSTOMY  2010  . HERNIA REPAIR  2010  . TUBAL LIGATION      OB History    No data available       Home Medications    Prior to Admission medications   Medication Sig Start Date End Date Taking? Authorizing Provider  cholecalciferol (VITAMIN D) 1000 units tablet Take 1,000 Units by mouth daily.   Yes Historical Provider, MD  hydrochlorothiazide (HYDRODIURIL) 25 MG tablet Take 1 tablet (25 mg total) by mouth daily. 09/08/15  Yes [name], MD  ibuprofen (ADVIL,MOTRIN) 200 MG tablet Take 400 mg by mouth every 6  (six) hours as needed for moderate pain.    Yes Historical Provider, MD  Multiple Vitamin (MULTIVITAMIN WITH MINERALS) TABS tablet Take 1 tablet by mouth every other day.   Yes Historical Provider, MD  PARoxetine (PAXIL) 40 MG tablet Take 40 mg by mouth every morning. 10/01/15  Yes Historical Provider, MD  VENTOLIN HFA 108 (90 Base) MCG/ACT inhaler Inhale 2 puffs into the lungs every 4 (four) hours as needed for shortness of breath.  10/06/15  Yes Historical Provider, MD  albuterol (PROVENTIL HFA;VENTOLIN HFA) 108 (90 Base) MCG/ACT inhaler Inhale 2 puffs into the lungs every 4 (four) hours as needed for wheezing or shortness of breath. 03/19/16   Marily Memos, MD  azithromycin (ZITHROMAX) 250 MG tablet Take 1 tablet (250 mg total) by mouth daily. Take 1 every day until finished. 03/19/16   Marily Memos, MD  potassium chloride SA (K-DUR,KLOR-CON) 20 MEQ tablet Take 2 tablets (40 mEq total) by mouth 2 (two) times daily. 03/19/16 03/26/16  Marily Memos, MD  predniSONE (DELTASONE) 20 MG tablet 2 tabs po daily x 4 days 03/19/16   Marily Memos, MD    Family History Family History  Problem Relation Age of Onset  . Heart disease Mother 63  . Heart failure Mother   . Asthma Mother   . Emphysema Mother   . Alzheimer's disease Father 75  . Emphysema Brother   .  Asthma Brother   . Colon cancer Neg Hx     Social History Social History  Substance Use Topics  . Smoking status: Current Every Day Smoker    Packs/day: 0.50    Years: 40.00    Types: Cigarettes  . Smokeless tobacco: Never Used     Comment: trying to quit, down to 2cigs/daily  . Alcohol use No     Allergies   Aspirin; Ciprofloxacin; Codeine; Eggs or egg-derived products; and Penicillins   Review of Systems Review of Systems  Constitutional: Negative for fever.  HENT: Negative for ear pain.   Respiratory: Positive for cough and shortness of breath.   Cardiovascular: Negative for orthopnea.  Skin: Negative for rash.  All other  systems reviewed and are negative.    Physical Exam Updated Vital Signs BP 127/75   Pulse 96   Temp 98.3 F (36.8 C)   Resp 17   Ht 5\' 4"  (1.626 m)   Wt 146 lb (66.2 kg)   SpO2 (!) 84%   BMI 25.06 kg/m   Physical Exam  Constitutional: She is oriented to person, place, and time. She appears well-developed and well-nourished. No distress.  HENT:  Head: Normocephalic and atraumatic.  Eyes: Conjunctivae are normal.  Neck: Neck supple.  Cardiovascular: Normal rate and regular rhythm.   No murmur heard. Pulmonary/Chest: Effort normal. No respiratory distress. She has wheezes. She has no rales.  Abdominal: Soft. There is no tenderness.  Musculoskeletal: Normal range of motion. She exhibits no edema or deformity.  Neurological: She is alert and oriented to person, place, and time.  Skin: Skin is warm and dry. She is not diaphoretic.  Psychiatric: She has a normal mood and affect.  Nursing note and vitals reviewed.    ED Treatments / Results  Labs (all labs ordered are listed, but only abnormal results are displayed) Labs Reviewed  CBC WITH DIFFERENTIAL/PLATELET - Abnormal; Notable for the following:       Result Value   RBC 5.13 (*)    Hemoglobin 15.2 (*)    All other components within normal limits  BASIC METABOLIC PANEL - Abnormal; Notable for the following:    Potassium 3.2 (*)    Chloride 96 (*)    Glucose, Bld 128 (*)    All other components within normal limits    EKG  EKG Interpretation None       Radiology Dg Chest 2 View  Result Date: 03/18/2016 CLINICAL DATA:  55 year old female with shortness of breath and chest pressure for 2 days. Initial encounter. Smoker. EXAM: CHEST  2 VIEW COMPARISON:  Chest radiograph 11/25/2014. FINDINGS: Stable lung volumes which appear mildly enlarged. Mild diffuse increased interstitial markings are stable. Normal cardiac size and mediastinal contours. Calcified aortic atherosclerosis. Visualized tracheal air column is within  normal limits. No pneumothorax, pulmonary edema, pleural effusion or confluent pulmonary opacity. Osteopenia. No acute osseous abnormality identified. IMPRESSION: No acute cardiopulmonary abnormality. Suspected chronic pulmonary hyperinflation. Electronically Signed   By: Odessa FlemingH  Hall M.D.   On: 03/18/2016 20:31    Procedures Procedures (including critical care time)  Medications Ordered in ED Medications  albuterol (PROVENTIL) (2.5 MG/3ML) 0.083% nebulizer solution 5 mg (5 mg Nebulization Not Given 03/19/16 0109)  predniSONE (DELTASONE) tablet 40 mg (not administered)  albuterol (PROVENTIL) (2.5 MG/3ML) 0.083% nebulizer solution (2.5 mg  Given 03/18/16 1910)  albuterol (PROVENTIL,VENTOLIN) solution continuous neb (10 mg/hr Nebulization Given 03/18/16 2204)  ipratropium (ATROVENT) nebulizer solution 0.5 mg (0.5 mg Nebulization Given 03/18/16 2204)  azithromycin [facility]) tablet 500 mg (500 mg Oral Given 03/18/16 2214)  magnesium sulfate IVPB 2 g 50 mL (0 g Intravenous Stopped 03/18/16 2351)  potassium chloride SA (K-DUR,KLOR-CON) CR tablet 40 mEq (40 mEq Oral Given 03/18/16 2251)  albuterol (PROVENTIL HFA;VENTOLIN HFA) 108 (90 Base) MCG/ACT inhaler 2 puff (2 puffs Inhalation Given 03/19/16 0139)     Initial Impression / Assessment and Plan / ED Course  I have reviewed the triage vital signs and the nursing notes.  Pertinent labs & imaging results that were available during my care of the patient were reviewed by me and considered in my medical decision making (see chart for details).  Clinical Course   COPD exacerbation. Decreased BS, wheezing, plan for albuterol/abx/steroids and will reassess for dispo.   Vital sign record reflects that the patient's last saturation prior to discharge was 84, this is incorrect. Her oxygen was around 84 when sleeping. When awake and after a couple deep breaths it increases to 95-96. Her lungs are much more clear. VS normal. Ambulates with sats that drop to around  89% but not tachypneic or distressed. Patient requests discharge. I offered admission to the hospital but she preferred to go home and states she has no problem coming back if thing worsen and lives with her child who can help in that manner. Plan for dc with albuterol/steroids/antibiotics/potassium supplementation and close PCP follow up.    Final Clinical Impressions(s) / ED Diagnoses   Final diagnoses:  COPD exacerbation (HCC)    New Prescriptions New Prescriptions   ALBUTEROL (PROVENTIL HFA;VENTOLIN HFA) 108 (90 BASE) MCG/ACT INHALER    Inhale 2 puffs into the lungs every 4 (four) hours as needed for wheezing or shortness of breath.   AZITHROMYCIN (ZITHROMAX) 250 MG TABLET    Take 1 tablet (250 mg total) by mouth daily. Take 1 every day until finished.   POTASSIUM CHLORIDE SA (K-DUR,KLOR-CON) 20 MEQ TABLET    Take 2 tablets (40 mEq total) by mouth 2 (two) times daily.   PREDNISONE (DELTASONE) 20 MG TABLET    2 tabs po daily x 4 days       Marily Memos, MD 03/19/16 [phone]

## 2016-03-19 NOTE — ED Notes (Signed)
Ambulated Pt in hallway. O2 stat improved from 85% to 89%

## 2016-03-26 MED FILL — HYDROCHLOROTHIAZIDE 25 MG T: 25 | 30 days supply | Qty: 30 | Fill #5

## 2016-03-26 MED FILL — PARoxetine HCL 30 MG TABS: 30 | 30 days supply | Qty: 30 | Fill #2

## 2016-04-01 ENCOUNTER — Ambulatory Visit (INDEPENDENT_AMBULATORY_CARE_PROVIDER_SITE_OTHER): Payer: Medicare Other | Admitting: Emergency Medicine

## 2016-04-01 ENCOUNTER — Encounter: Payer: Self-pay | Admitting: Emergency Medicine

## 2016-04-01 DIAGNOSIS — J439 Emphysema, unspecified: Secondary | ICD-10-CM

## 2016-04-01 DIAGNOSIS — F332 Major depressive disorder, recurrent severe without psychotic features: Secondary | ICD-10-CM | POA: Diagnosis not present

## 2016-04-01 MED ORDER — DOXYCYCLINE HYCLATE 100 MG PO TABS: 100.0000 mg | ORAL_TABLET | Freq: Two times a day (BID) | ORAL | 0 refills | Status: DC

## 2016-04-01 MED ORDER — MOMETASONE FURO-FORMOTEROL FUM 200-5 MCG/ACT IN AERO: 2.0000 | INHALATION_SPRAY | Freq: Two times a day (BID) | RESPIRATORY_TRACT | 0 refills | Status: DC

## 2016-04-01 MED ORDER — TIOTROPIUM BROMIDE MONOHYDRATE 18 MCG IN CAPS: 18.0000 ug | ORAL_CAPSULE | Freq: Every day | RESPIRATORY_TRACT | 5 refills | Status: DC

## 2016-04-01 MED ORDER — MOMETASONE FURO-FORMOTEROL FUM 200-5 MCG/ACT IN AERO: 2.0000 | INHALATION_SPRAY | Freq: Two times a day (BID) | RESPIRATORY_TRACT | 5 refills | Status: DC

## 2016-04-01 MED ORDER — PREDNISONE 10 MG PO TABS: ORAL_TABLET | ORAL | 0 refills | Status: DC

## 2016-04-01 NOTE — Progress Notes (Signed)
   Subjective:    Patient ID: Maria Oliver, female    DOB: 02-Jan-1961, 55 y.o.   MRN: [id number]  HPI This 55 year old woman followed in our office for COPD. She has a bronchodilator response on spirometry with evidence for airtrapping. She is followed by Dr Kendrick FriesMcQuaid. She is supposed to be on [place] and Spiriva, states that she never got these and just used samples here and there. She has been using Proventil only. She states her last cigarette was 2 weeks ago. She began to feel bad - dyspneic and weak- about 2 weeks ago. Was seen in ED 13 days ago, received nebs, prednisone, abx, azithro. She felt better on the pred. When the meds ended she began to have more wheeze, cough prod clear to yellow. She has some neck, facial pain. No fevers to her knowledge. No known sick contacts. Using albuterol 4-5x a day. Until 2 weeks ago she was using rarely. Denies GI sx.    Review of Systems As per HPI     Objective:   Physical Exam Vitals:   04/01/16 1513  BP: 96/60  Pulse: 77  SpO2: 92%  Weight: 139 lb (63 kg)  Height: 5\' 4"  (1.626 m)  Gen: Pleasant, well-nourished, in no distress,  normal affect  ENT: No lesions,  mouth clear,  oropharynx clear, no postnasal drip  Neck: No JVD, no TMG, no carotid bruits  Lungs: No use of accessory muscles, bilateral diffuse soft exp wheezes.   Cardiovascular: RRR, heart sounds normal, no murmur or gallops, no peripheral edema  Abdomen: large abd hernia  Musculoskeletal: No deformities, no cyanosis or clubbing  Neuro: alert, non focal  Skin: Warm, no lesions or rashes      Assessment & Plan:  COPD (chronic obstructive pulmonary disease) with emphysema She appears to have a subacute exacerbation. She was treated with short course of prednisone and azithromycin but has not resolved. She now feels as badly as she did the beginning of this illness. A large barrier to her continued care is her inability to get her bronchodilators. She has not had Dulera  or Spiriva with regularity. Continues to smoke, although states not in 2 weeks.  - will try to get her Dulera, doubt we have Spiriva samples.  - treat with longer course pred and also doxycycline.  - smoking cessation  - rov w DR Kendrick FriesMcQuaid or APP in 2-3 weeks   Take prednisone taper until completely gone.  Take doxycycline 100mg  twice a day until completely gone.  We will restart Dulera 2 puffs twice a day. Samples if possible.  We will try to restart Spiriva once a day through your pharmacy Take albuterol 2 puffs up to every 4 hours if needed for shortness of breath.  Work hard on stopping smoking completely  Follow with Dr Kendrick FriesMcQuaid or with NP here in 2-3 weeks to assess your progress.   Maria Pupaobert Farzana Koci, MD, PhD 04/01/2016, 3:36 PM [facility] [phone] or if no answer [phone]

## 2016-04-01 NOTE — Patient Instructions (Signed)
Take prednisone taper until completely gone.  Take doxycycline 100mg  twice a day until completely gone.  We will restart Dulera 2 puffs twice a day. Samples if possible.  We will try to restart Spiriva once a day through your pharmacy Take albuterol 2 puffs up to every 4 hours if needed for shortness of breath.  Work hard on stopping smoking completely  Follow with Dr Kendrick FriesMcQuaid or with NP here in 2-3 weeks to assess your progress.

## 2016-04-01 NOTE — Addendum Note (Signed)
Addended by: Garfield CorneaMABRY, JASMINE L on: 04/01/2016 03:52 PM   Modules accepted: Orders

## 2016-04-01 NOTE — Addendum Note (Signed)
Addended by: Garfield CorneaMABRY, Simpson Paulos L on: 04/01/2016 04:45 PM   Modules accepted: Orders

## 2016-04-01 NOTE — Assessment & Plan Note (Addendum)
She appears to have a subacute exacerbation. She was treated with short course of prednisone and azithromycin but has not resolved. She now feels as badly as she did the beginning of this illness. A large barrier to her continued care is her inability to get her bronchodilators. She has not had Dulera or Spiriva with regularity. Continues to smoke, although states not in 2 weeks.  - will try to get her Dulera, doubt we have Spiriva samples.  - treat with longer course pred and also doxycycline.  - smoking cessation  - rov w DR Kendrick FriesMcQuaid or APP in 2-3 weeks   Take prednisone taper until completely gone.  Take doxycycline 100mg  twice a day until completely gone.  We will restart Dulera 2 puffs twice a day. Samples if possible.  We will try to restart Spiriva once a day through your pharmacy Take albuterol 2 puffs up to every 4 hours if needed for shortness of breath.  Work hard on stopping smoking completely  Follow with Dr Kendrick FriesMcQuaid or with NP here in 2-3 weeks to assess your progress.

## 2016-04-22 ENCOUNTER — Other Ambulatory Visit: Payer: Self-pay | Admitting: Family Medicine

## 2016-04-22 MED ORDER — TIOTROPIUM BROMIDE MONOHYDRATE 18 MCG IN CAPS: 18.0000 ug | ORAL_CAPSULE | Freq: Every day | RESPIRATORY_TRACT | 3 refills | Status: DC

## 2016-04-27 ENCOUNTER — Ambulatory Visit (INDEPENDENT_AMBULATORY_CARE_PROVIDER_SITE_OTHER): Payer: Medicare Other | Admitting: Pulmonary Disease

## 2016-04-27 ENCOUNTER — Telehealth: Payer: Self-pay | Admitting: Family Medicine

## 2016-04-27 ENCOUNTER — Encounter: Payer: Self-pay | Admitting: Pulmonary Disease

## 2016-04-27 ENCOUNTER — Other Ambulatory Visit: Payer: Self-pay | Admitting: Family Medicine

## 2016-04-27 DIAGNOSIS — I10 Essential (primary) hypertension: Secondary | ICD-10-CM

## 2016-04-27 DIAGNOSIS — J432 Centrilobular emphysema: Secondary | ICD-10-CM | POA: Diagnosis not present

## 2016-04-27 DIAGNOSIS — F17200 Nicotine dependence, unspecified, uncomplicated: Secondary | ICD-10-CM

## 2016-04-27 MED ORDER — NICOTINE 10 MG IN INHA: 1.0000 | RESPIRATORY_TRACT | 4 refills | Status: DC | PRN

## 2016-04-27 MED ORDER — MOMETASONE FURO-FORMOTEROL FUM 200-5 MCG/ACT IN AERO: 2.0000 | INHALATION_SPRAY | Freq: Two times a day (BID) | RESPIRATORY_TRACT | 5 refills | Status: DC

## 2016-04-27 MED ORDER — PAROXETINE HCL 40 MG PO TABS: 40.0000 mg | ORAL_TABLET | Freq: Every morning | ORAL | 2 refills | Status: DC

## 2016-04-27 MED ORDER — HYDROCHLOROTHIAZIDE 25 MG PO TABS: 25.0000 mg | ORAL_TABLET | Freq: Every day | ORAL | 0 refills | Status: DC

## 2016-04-27 MED FILL — !DULERA 200 MCG/5 MCG INH: 200-5 | 30 days supply | Qty: 1 | Fill #0

## 2016-04-27 MED FILL — HYDROCHLOROTHIAZIDE 25 MG T: 25 | 30 days supply | Qty: 30 | Fill #0

## 2016-04-27 MED FILL — PARoxetine HCL 40 MG TABS: 40 | 30 days supply | Qty: 30 | Fill #0

## 2016-04-27 NOTE — Assessment & Plan Note (Signed)
Her severe COPD is worsening this year and that she's had an exacerbation. This is complicated by the fact that she continues to smoke and she does not take her medications on a regular basis. Today we spent a significant amount of time in counseling discussing the fact that she needs to quit smoking desperately and she needs to use her medicines regularly. I also advised that an exacerbation of COPD is a bad thing and can indicate that her disease is worsening and worsens her prognosis.  Plan: Use Dulera and Spiriva regularly  Use albuterol as needed Quit smoking

## 2016-04-27 NOTE — Assessment & Plan Note (Signed)
Counseled at length to quit smoking today. She is interested in using the Nicotrol inhaler. I will prescribe this.

## 2016-04-27 NOTE — Telephone Encounter (Signed)
Patient came in  requesting refills on medication:  Hydrochlorothiazide 25 mg. Paroxetine HCL 30 mg      [facility]      [facility]:   Please follow up with patient.

## 2016-04-27 NOTE — Addendum Note (Signed)
Addended by: Velvet BatheAULFIELD, Blia Totman L on: 04/27/2016 03:40 PM   Modules accepted: Orders

## 2016-04-27 NOTE — Progress Notes (Signed)
Subjective:    Patient ID: Maria Oliver, female    DOB: January 10, 1961, 55 y.o.   MRN: [id number]   Synopsis: Former patient of Dr. Shelle Ironlance who has COPD.  [facility] after having pneumonia. CXR 06/2014: hyperinflation, prominent BV markings, no acute process PFT's 07/2014:  FEV1 1.63 (63%), ratio 66, 19% increase with BD, +airtrapping, normal TLC, DLCO 54% Still smoking as of August 2016.  HPI Chief Complaint  Patient presents with  . Follow-up    pt much improved since 10/19 acute ov with RB- still notes some SOB with exertion, sinus congestion, chest congestion, prod cough with mostly clear mucus.    [name] had an exacerbation of her COPD back in October when she was smoking and not using the [facility] and Spiriva regularly.  She says that she still isn't taken the Spiriva regularly at this point.  She still produces some mucus but no real problems with significant dyspnea.  She still has mild dyspnea with long walks or carrying things but otherwise not too bad.   She is smoking 4-5 cigarettes a day.  She had stopped for about 1 week.  She said that she was stressed when she did this.  She doesn't want to vape.  She has tried nicotine patches before but this didn't help.    Past Medical History:  Diagnosis Date  . Abdominal pain   . Anxiety   . Arthritis   . COPD (chronic obstructive pulmonary disease) (HCC)   . Generalized headaches   . Hernia, inguinal, right   . Hypertension   . Leg swelling       Review of Systems  Constitutional: Negative for chills, fatigue and fever.  HENT: Negative for postnasal drip, rhinorrhea and sinus pressure.   Respiratory: Positive for cough and shortness of breath. Negative for wheezing.   Cardiovascular: Negative for chest pain, palpitations and leg swelling.       Objective:   Physical Exam Vitals:   04/27/16 1518  BP: 114/62  Pulse: 74  SpO2: 97%  Weight: 141 lb (64 kg)  Height: 5\' 4"  (1.626 m)   room air  Gen: well appearing HENT: OP clear, neck supple PULM: few crackles R base, otherwise clear, normal percussion CV: RRR, no mgr, trace edema GI: BS+, soft, nontender Derm: no cyanosis or rash Psyche: normal mood and affect   Records from the last visit with Jasmine Aweob Byrum reviewed where Spiriva was added and she was treated for a COPD exacerbation  CBC    Component Value Date/Time   WBC 10.4 03/18/2016 1941   RBC 5.13 (H) 03/18/2016 1941   HGB 15.2 (H) 03/18/2016 1941   HCT 45.7 03/18/2016 1941   PLT 362 03/18/2016 1941   MCV 89.1 03/18/2016 1941   MCH 29.6 03/18/2016 1941   MCHC 33.3 03/18/2016 1941   RDW 14.0 03/18/2016 1941   LYMPHSABS 3.2 03/18/2016 1941   MONOABS 0.8 03/18/2016 1941   EOSABS 0.7 03/18/2016 1941   BASOSABS 0.1 03/18/2016 1941        Assessment & Plan:  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 This is complicated by the fact that she continues to smoke and she does not take her medications on a regular basis. Today we spent a significant amount of time in counseling discussing the fact that she needs to quit smoking desperately and she needs to use her  medicines regularly. I also advised that an exacerbation of COPD is a bad thing and can indicate that her disease is worsening and worsens her prognosis.  Plan: Use Dulera and Spiriva regularly  Use albuterol as needed Quit smoking  SMOKER Counseled at length to quit smoking today. She is interested in using the Nicotrol inhaler. I will prescribe this.    Current Outpatient Prescriptions:  .  albuterol (PROVENTIL HFA;VENTOLIN HFA) 108 (90 Base) MCG/ACT inhaler, Inhale 2 puffs into the lungs every 4 (four) hours as needed for wheezing or shortness of breath., Disp: 1 Inhaler, Rfl: 0 .  cholecalciferol (VITAMIN D) 1000 units tablet, Take 1,000 Units by mouth daily., Disp: , Rfl:  .  hydrochlorothiazide  (HYDRODIURIL) 25 MG tablet, Take 1 tablet (25 mg total) by mouth daily., Disp: 30 tablet, Rfl: 5 .  ibuprofen (ADVIL,MOTRIN) 200 MG tablet, Take 400 mg by mouth every 6 (six) hours as needed for moderate pain. , Disp: , Rfl:  .  mometasone-formoterol (DULERA) 200-5 MCG/ACT AERO, Inhale 2 puffs into the lungs 2 (two) times daily., Disp: 1 Inhaler, Rfl: 5 .  Multiple Vitamin (MULTIVITAMIN WITH MINERALS) TABS tablet, Take 1 tablet by mouth every other day., Disp: , Rfl:  .  PARoxetine (PAXIL) 40 MG tablet, Take 40 mg by mouth every morning., Disp: , Rfl: 2 .  potassium chloride SA (K-DUR,KLOR-CON) 20 MEQ tablet, Take 2 tablets (40 mEq total) by mouth 2 (two) times daily., Disp: 28 tablet, Rfl: 0 .  tiotropium (SPIRIVA HANDIHALER) 18 MCG inhalation capsule, Place 1 capsule (18 mcg total) into inhaler and inhale daily., Disp: 90 capsule, Rfl: 3 .  VENTOLIN HFA 108 (90 Base) MCG/ACT inhaler, Inhale 2 puffs into the lungs every 4 (four) hours as needed for shortness of breath. , Disp: , Rfl: 3 .  nicotine (NICOTROL) 10 MG inhaler, Inhale 1 Cartridge (1 continuous puffing total) into the lungs as needed for smoking cessation., Disp: 42 each, Rfl: 4

## 2016-04-27 NOTE — Patient Instructions (Signed)
Use the Nicotrol inhaler as needed Quit smoking Use Dulera and Spiriva Follow-up in 3-4 months or sooner if needed

## 2016-05-08 ENCOUNTER — Emergency Department (HOSPITAL_COMMUNITY): Payer: Medicare Other

## 2016-05-08 ENCOUNTER — Encounter (HOSPITAL_COMMUNITY): Payer: Self-pay | Admitting: *Deleted

## 2016-05-08 ENCOUNTER — Emergency Department (HOSPITAL_COMMUNITY)
Admission: EM | Admit: 2016-05-08 | Discharge: 2016-05-08 | Disposition: A | Discharge status: Home / Self Care | Payer: Medicare Other | Attending: Emergency Medicine | Admitting: Emergency Medicine

## 2016-05-08 DIAGNOSIS — W1839XA Other fall on same level, initial encounter: Secondary | ICD-10-CM | POA: Diagnosis not present

## 2016-05-08 DIAGNOSIS — Y9289 Other specified places as the place of occurrence of the external cause: Secondary | ICD-10-CM | POA: Insufficient documentation

## 2016-05-08 DIAGNOSIS — I10 Essential (primary) hypertension: Secondary | ICD-10-CM | POA: Insufficient documentation

## 2016-05-08 DIAGNOSIS — S59291A Other physeal fracture of lower end of radius, right arm, initial encounter for closed fracture: Secondary | ICD-10-CM | POA: Diagnosis not present

## 2016-05-08 DIAGNOSIS — F17210 Nicotine dependence, cigarettes, uncomplicated: Secondary | ICD-10-CM | POA: Diagnosis not present

## 2016-05-08 DIAGNOSIS — S52501A Unspecified fracture of the lower end of right radius, initial encounter for closed fracture: Secondary | ICD-10-CM | POA: Insufficient documentation

## 2016-05-08 DIAGNOSIS — S6991XA Unspecified injury of right wrist, hand and finger(s), initial encounter: Secondary | ICD-10-CM | POA: Diagnosis present

## 2016-05-08 DIAGNOSIS — J449 Chronic obstructive pulmonary disease, unspecified: Secondary | ICD-10-CM | POA: Insufficient documentation

## 2016-05-08 DIAGNOSIS — Z79899 Other long term (current) drug therapy: Secondary | ICD-10-CM | POA: Insufficient documentation

## 2016-05-08 DIAGNOSIS — Y9351 Activity, roller skating (inline) and skateboarding: Secondary | ICD-10-CM | POA: Insufficient documentation

## 2016-05-08 DIAGNOSIS — Y999 Unspecified external cause status: Secondary | ICD-10-CM | POA: Insufficient documentation

## 2016-05-08 MED ORDER — HYDROMORPHONE HCL 2 MG/ML IJ SOLN
1.0000 mg | Freq: Once | INTRAMUSCULAR | Status: AC
  Administered 2016-05-08: 1 mg via INTRAMUSCULAR
  Filled 2016-05-08: qty 1

## 2016-05-08 MED ORDER — HYDROMORPHONE HCL 2 MG PO TABS: 2.0000 mg | ORAL_TABLET | ORAL | 0 refills | Status: DC | PRN

## 2016-05-08 MED ORDER — OXYCODONE-ACETAMINOPHEN 5-325 MG PO TABS
1.0000 | ORAL_TABLET | Freq: Once | ORAL | Status: DC
  Filled 2016-05-08: qty 1

## 2016-05-08 NOTE — ED Notes (Signed)
Ortho at bedside.

## 2016-05-08 NOTE — Discharge Instructions (Signed)
Do not drive while taking the narcotic pain medication as it will make you sleepy.  Call Dr. Glenna Durandrtmann's office Monday morning for a follow up appointment. Return here as needed for any problems.

## 2016-05-08 NOTE — ED Triage Notes (Signed)
Pt fell at birthday party tonight when she tripped and caught herself with right arm.  Injury and swelling to right wrist

## 2016-05-08 NOTE — ED Provider Notes (Signed)
[facility]-EMERGENCY DEPT Provider Note   CSN: [id number] Arrival date & time: 05/08/16  2122  By signing my name below, I, Alyssa GroveMartin Green, attest that this documentation has been prepared under the direction and in the presence of Kerrie BuffaloHope Neese, NP. Electronically Signed: Alyssa GroveMartin Green, ED Scribe. 05/08/16. 10:22 PM.  History   Chief Complaint Chief Complaint  Patient presents with  . Wrist Injury   The history is provided by the patient. No language interpreter was used.   HPI Comments: Maria Oliver is a 55 y.o. female with PMHx of COPD, HTN, and Arthritis who presents to the Emergency Department complaining of constant 9/10 wrist pain s/p fall earlier tonight. Pt was skating when she fell forward breaking her fall with her right wrist. Pt reports associated joint swelling. Pt currently has hernia of the abdomen and was trying to protect that when she fell. Patient denies abdominal pain or any other problems associated with the fall.   Past Medical History:  Diagnosis Date  . Abdominal pain   . Anxiety   . Arthritis   . COPD (chronic obstructive pulmonary disease) (HCC)   . Generalized headaches   . Hernia, inguinal, right   . Hypertension   . Leg swelling     Patient Active Problem List   Diagnosis Date Noted  . Onychomycosis of right great toe 10/13/2015  . Cold sore 10/13/2015  . Osteoarthritis 08/11/2015  . Bilateral flank pain 08/11/2015  . Chronic abdominal pain 05/22/2015  . Chronic pain syndrome 05/22/2015  . Vitamin D insufficiency 02/06/2015  . Chronic knee pain 02/06/2015  . Ventral hernia without obstruction or gangrene 12/31/2014  . Anxiety 12/31/2014  . COPD (chronic obstructive pulmonary disease) with emphysema (HCC)   . SMOKER 10/01/2009  . Essential hypertension 10/01/2009    Past Surgical History:  Procedure Laterality Date  . COLON SURGERY  2010   per patient "colon take down"  . COLOSTOMY  2010  . HERNIA REPAIR  2010  . TUBAL LIGATION      OB  History    No data available     Home Medications    Prior to Admission medications   Medication Sig Start Date End Date Taking? Authorizing Provider  albuterol (PROVENTIL HFA;VENTOLIN HFA) 108 (90 Base) MCG/ACT inhaler Inhale 2 puffs into the lungs every 4 (four) hours as needed for wheezing or shortness of breath. 03/19/16   Marily MemosJason Mesner, MD  cholecalciferol (VITAMIN D) 1000 units tablet Take 1,000 Units by mouth daily.    Historical Provider, MD  hydrochlorothiazide (HYDRODIURIL) 25 MG tablet Take 1 tablet (25 mg total) by mouth daily. 04/27/16   [name], MD  HYDROmorphone (DILAUDID) 2 MG tablet Take 1 tablet (2 mg total) by mouth every 4 (four) hours as needed for severe pain. 05/08/16   Hope Orlene OchM Neese, NP  ibuprofen (ADVIL,MOTRIN) 200 MG tablet Take 400 mg by mouth every 6 (six) hours as needed for moderate pain.     Historical Provider, MD  mometasone-formoterol (DULERA) 200-5 MCG/ACT AERO Inhale 2 puffs into the lungs 2 (two) times daily. 04/27/16   Lupita Leashouglas B McQuaid, MD  Multiple Vitamin (MULTIVITAMIN WITH MINERALS) TABS tablet Take 1 tablet by mouth every other day.    Historical Provider, MD  nicotine (NICOTROL) 10 MG inhaler Inhale 1 Cartridge (1 continuous puffing total) into the lungs as needed for smoking cessation. 04/27/16   Lupita Leashouglas B McQuaid, MD  PARoxetine (PAXIL) 40 MG tablet Take 1 tablet (40 mg total) by mouth every  morning. 04/27/16   [name], MD  potassium chloride SA (K-DUR,KLOR-CON) 20 MEQ tablet Take 2 tablets (40 mEq total) by mouth 2 (two) times daily. 03/19/16 04/27/16  Marily Memos, MD  tiotropium (SPIRIVA HANDIHALER) 18 MCG inhalation capsule Place 1 capsule (18 mcg total) into inhaler and inhale daily. 04/22/16   [name], MD  VENTOLIN HFA 108 (90 Base) MCG/ACT inhaler Inhale 2 puffs into the lungs every 4 (four) hours as needed for shortness of breath.  10/06/15   Historical Provider, MD    Family History Family History  Problem Relation Age  of Onset  . Heart disease Mother 37  . Heart failure Mother   . Asthma Mother   . Emphysema Mother   . Alzheimer's disease Father 22  . Emphysema Brother   . Asthma Brother   . Colon cancer Neg Hx     Social History Social History  Substance Use Topics  . Smoking status: Current Every Day Smoker    Packs/day: 0.50    Years: 40.00    Types: Cigarettes    Last attempt to quit: 03/08/2016  . Smokeless tobacco: Never Used     Comment: Recently quit, started back to 4 cigs/daily  . Alcohol use No     Allergies   Aspirin; Ciprofloxacin; Codeine; Eggs or egg-derived products; Lactose intolerance (gi); Percocet [oxycodone-acetaminophen]; and Penicillins   Review of Systems Review of Systems  Constitutional: Negative for fever.  Musculoskeletal: Positive for arthralgias and joint swelling.       Right wrist pain, swelling and deformity  All other systems reviewed and are negative.  Physical Exam Updated Vital SignsPhysical Exam  Constitutional: She is oriented to person, place, and time. She appears well-developed and well-nourished. She is active. No distress.  HENT:  Head: Normocephalic and atraumatic.  Eyes: Conjunctivae are normal.  Neck: Neck supple.  Cardiovascular: Normal rate.   Radial pulses 2+  Pulmonary/Chest: Effort normal. No respiratory distress.  Musculoskeletal:       Right wrist: She exhibits decreased range of motion, bony tenderness, swelling and deformity.  Normal capillary refill.  Neurological: She is alert and oriented to person, place, and time. No sensory deficit.  Skin: Skin is warm and dry.  Psychiatric: She has a normal mood and affect. Her behavior is normal.  Nursing note and vitals reviewed.  ED Treatments / Results  DIAGNOSTIC STUDIES: Oxygen Saturation is 99% on RA, normal by my interpretation.    COORDINATION OF CARE: 10:21 PM Discussed treatment plan with pt at bedside which includes Percocet and pt agreed to plan.  Labs (all labs  ordered are listed, but only abnormal results are displayed) Labs Reviewed - No data to display   Radiology Dg Wrist Complete Right  Result Date: 05/08/2016 CLINICAL DATA:  Status post fall on outstretched right wrist, with swelling and pain. Initial encounter. EXAM: RIGHT WRIST - COMPLETE 3+ VIEW COMPARISON:  None. FINDINGS: There is a mildly impacted fracture of the distal radial metaphysis, with mild dorsal tilt. The ulnar styloid appears grossly intact. Surrounding soft tissue swelling is noted. The carpal rows are intact, and demonstrate normal alignment. Minimal degenerative change is noted at the first carpometacarpal joint. IMPRESSION: Mildly impacted fracture of the distal radial metaphysis, with mild dorsal tilt. Electronically Signed   By: Roanna Raider M.D.   On: 05/08/2016 22:30    Procedures Procedures (including critical care time) Consult with Dr. Orlan Leavens, on for hand call. He reviewed the x-rays and request application of splint and  f/u in the office.   Medications Ordered in ED Medications  HYDROmorphone (DILAUDID) injection 1 mg (1 mg Intramuscular Given 05/08/16 2234)     Initial Impression / Assessment and Plan / ED Course  I have reviewed the triage vital signs and the nursing notes.  Pertinent imaging results that were available during my care of the patient were reviewed by me and considered in my medical decision making (see chart for details).  Clinical Course    I personally performed the services described in this documentation, which was scribed in my presence. The recorded information has been reviewed and is accurate.  Final Clinical Impressions(s) / ED Diagnoses  55 y.o. female with right wrist pain, swelling and deformity stable for d/c without focal neuro deficits. Discussed with the patient clinical and x-ray findings and plan of care. All questioned fully answered. She will f/u with Dr. Melvyn Novasrtmann or return here if any problems arise.  Final diagnoses:    Closed fracture of distal end of right radius, unspecified fracture morphology, initial encounter    New Prescriptions New Prescriptions   HYDROMORPHONE (DILAUDID) 2 MG TABLET    Take 1 tablet (2 mg total) by mouth every 4 (four) hours as needed for severe pain.     MonmouthHope M Neese, NP 05/08/16 2339    Nira ConnPedro Eduardo Cardama, MD 05/09/16 [phone]

## 2016-05-08 NOTE — ED Notes (Signed)
Pt fell today and landed on her wrist. Pt c/o right wrist pain 10/10. Pt's R wrist is swollen compaired to her left. Pt denies hitting her head or any LOC.

## 2016-05-08 NOTE — ED Notes (Signed)
Patient verbalized understanding of discharge instructions and denies any further needs or questions at this time. VS stable. Patient ambulatory with steady gait, escorted to ED entrance in wheelchair.   

## 2016-05-11 ENCOUNTER — Ambulatory Visit: Payer: Medicare Other | Attending: Internal Medicine | Admitting: Physician Assistant

## 2016-05-11 VITALS — BP 144/77 | HR 77 | Temp 97.4°F | Resp 20 | Wt 139.0 lb

## 2016-05-11 DIAGNOSIS — R300 Dysuria: Secondary | ICD-10-CM | POA: Insufficient documentation

## 2016-05-11 DIAGNOSIS — J329 Chronic sinusitis, unspecified: Secondary | ICD-10-CM | POA: Diagnosis not present

## 2016-05-11 DIAGNOSIS — Z79899 Other long term (current) drug therapy: Secondary | ICD-10-CM | POA: Diagnosis not present

## 2016-05-11 DIAGNOSIS — Z0001 Encounter for general adult medical examination with abnormal findings: Secondary | ICD-10-CM | POA: Insufficient documentation

## 2016-05-11 DIAGNOSIS — J019 Acute sinusitis, unspecified: Secondary | ICD-10-CM | POA: Diagnosis not present

## 2016-05-11 DIAGNOSIS — R51 Headache: Secondary | ICD-10-CM | POA: Insufficient documentation

## 2016-05-11 LAB — POCT URINALYSIS DIPSTICK
Bilirubin, UA: NEGATIVE
Glucose, UA: NEGATIVE
Ketones, UA: NEGATIVE
Leukocytes, UA: NEGATIVE
NITRITE UA: NEGATIVE
PROTEIN UA: NEGATIVE
RBC UA: NEGATIVE
SPEC GRAV UA: 1.015
UROBILINOGEN UA: 0.2
pH, UA: 8.5

## 2016-05-11 MED ORDER — AZITHROMYCIN 250 MG PO TABS: ORAL_TABLET | ORAL | 0 refills | Status: DC

## 2016-05-11 MED FILL — ?AZITHROMYCIN 250 MG TABLET: 250 | 5 days supply | Qty: 6 | Fill #0

## 2016-05-11 NOTE — Progress Notes (Unsigned)
Patient walk in to clinic complaining of dizziness x 2 days. c/o sinus pressure x 3 days.   BP 144/77, temp 97.4;Been taking advil for sinus pressure. Denies sinus drainage.   Left ear show fluid build up.

## 2016-05-11 NOTE — Progress Notes (Signed)
Chief Complaint: Ear ache and I may have a UTI  Subjective: 1. 55 yo female presents to walkin clinic with left ear pain. Also with headaches and slight nasal drainage.  2. Believes she has a UTI. Frequency. Malodorous and dark urine.   ROS:  GEN: denies fever or chills, denies change in weight Skin: denies lesions or rashes HEENT: + headache, +earache, epistaxis, sore throat, or neck pain LUNGS: denies SHOB, dyspnea, PND, orthopnea ABD: denies abd pain, N or V EXT: denies muscle spasms or swelling; no pain in lower ext, no weakness NEURO: denies numbness or tingling, denies sz, stroke or TIA   Objective:  Vitals:   05/11/16 1604  BP: (!) 144/77  Pulse: 77  Resp: 20  Temp: 97.4 F (36.3 C)  TempSrc: Oral  SpO2: 97%  Weight: 139 lb (63 kg)    Physical Exam: General: in no acute distress. HEENT: no pallor, no icterus, moist oral mucosa, no JVD, no lymphadenopathy Ear bilateral redness L>R Heart: Normal  s1 &s2  Regular rate and rhythm, without murmurs, rubs, gallops. Lungs: Clear to auscultation bilaterally.   Medications: Prior to Admission medications   Medication Sig Start Date End Date Taking? Authorizing Provider  albuterol (PROVENTIL HFA;VENTOLIN HFA) 108 (90 Base) MCG/ACT inhaler Inhale 2 puffs into the lungs every 4 (four) hours as needed for wheezing or shortness of breath. 03/19/16   Marily MemosJason Mesner, MD  cholecalciferol (VITAMIN D) 1000 units tablet Take 1,000 Units by mouth daily.    Historical Provider, MD  hydrochlorothiazide (HYDRODIURIL) 25 MG tablet Take 1 tablet (25 mg total) by mouth daily. 04/27/16   [name], MD  HYDROmorphone (DILAUDID) 2 MG tablet Take 1 tablet (2 mg total) by mouth every 4 (four) hours as needed for severe pain. 05/08/16   Hope Orlene OchM Neese, NP  ibuprofen (ADVIL,MOTRIN) 200 MG tablet Take 400 mg by mouth every 6 (six) hours as needed for moderate pain.     Historical Provider, MD  mometasone-formoterol (DULERA) 200-5 MCG/ACT AERO  Inhale 2 puffs into the lungs 2 (two) times daily. 04/27/16   Lupita Leashouglas B McQuaid, MD  Multiple Vitamin (MULTIVITAMIN WITH MINERALS) TABS tablet Take 1 tablet by mouth every other day.    Historical Provider, MD  nicotine (NICOTROL) 10 MG inhaler Inhale 1 Cartridge (1 continuous puffing total) into the lungs as needed for smoking cessation. 04/27/16   Lupita Leashouglas B McQuaid, MD  PARoxetine (PAXIL) 40 MG tablet Take 1 tablet (40 mg total) by mouth every morning. 04/27/16   [name], MD  potassium chloride SA (K-DUR,KLOR-CON) 20 MEQ tablet Take 2 tablets (40 mEq total) by mouth 2 (two) times daily. 03/19/16 04/27/16  Marily MemosJason Mesner, MD  tiotropium (SPIRIVA HANDIHALER) 18 MCG inhalation capsule Place 1 capsule (18 mcg total) into inhaler and inhale daily. 04/22/16   [name], MD  VENTOLIN HFA 108 (90 Base) MCG/ACT inhaler Inhale 2 puffs into the lungs every 4 (four) hours as needed for shortness of breath.  10/06/15   Historical Provider, MD    Assessment: 1. Acute sinusitis 2. Dysuria   Plan: Amoxicillin to treat both  Follow up:as scheduled with Dr. Armen PickupFunches  The patient was given clear instructions to go to ER or return to medical center if symptoms don't improve, worsen or new problems develop. The patient verbalized understanding. The patient was told to call to get lab results if they haven't heard anything in the next week.   This note has been created with Teaching laboratory technicianDragon speech recognition software and  smart Lobbyistphrase technology. Any transcriptional errors are unintentional.   Scot Juniffany Noel, PA-C 05/11/2016, 4:06 PM

## 2016-05-12 DIAGNOSIS — S52531A Colles' fracture of right radius, initial encounter for closed fracture: Secondary | ICD-10-CM | POA: Diagnosis not present

## 2016-05-17 DIAGNOSIS — G8918 Other acute postprocedural pain: Secondary | ICD-10-CM | POA: Diagnosis not present

## 2016-05-17 DIAGNOSIS — S52571A Other intraarticular fracture of lower end of right radius, initial encounter for closed fracture: Secondary | ICD-10-CM | POA: Diagnosis not present

## 2016-05-17 DIAGNOSIS — S52501A Unspecified fracture of the lower end of right radius, initial encounter for closed fracture: Secondary | ICD-10-CM | POA: Diagnosis not present

## 2016-05-17 DIAGNOSIS — S52531A Colles' fracture of right radius, initial encounter for closed fracture: Secondary | ICD-10-CM | POA: Diagnosis not present

## 2016-06-09 ENCOUNTER — Other Ambulatory Visit: Payer: Self-pay | Admitting: Family Medicine

## 2016-06-09 DIAGNOSIS — I10 Essential (primary) hypertension: Secondary | ICD-10-CM

## 2016-06-09 MED FILL — PARoxetine HCL 40 MG TABS: 40 | 30 days supply | Qty: 30 | Fill #1

## 2016-06-09 MED FILL — HYDROCHLOROTHIAZIDE 25 MG T: 25 | 30 days supply | Qty: 30 | Fill #0

## 2016-06-10 DIAGNOSIS — S52531D Colles' fracture of right radius, subsequent encounter for closed fracture with routine healing: Secondary | ICD-10-CM | POA: Diagnosis not present

## 2016-06-10 DIAGNOSIS — M25531 Pain in right wrist: Secondary | ICD-10-CM | POA: Diagnosis not present

## 2016-06-21 ENCOUNTER — Encounter: Payer: Self-pay | Admitting: Family Medicine

## 2016-06-21 ENCOUNTER — Ambulatory Visit: Payer: Medicare Other | Attending: Family Medicine | Admitting: Family Medicine

## 2016-06-21 VITALS — BP 106/74 | HR 78 | Temp 97.9°F | Ht 64.0 in | Wt 142.2 lb

## 2016-06-21 DIAGNOSIS — F17210 Nicotine dependence, cigarettes, uncomplicated: Secondary | ICD-10-CM | POA: Insufficient documentation

## 2016-06-21 DIAGNOSIS — I10 Essential (primary) hypertension: Secondary | ICD-10-CM | POA: Diagnosis not present

## 2016-06-21 DIAGNOSIS — G8929 Other chronic pain: Secondary | ICD-10-CM | POA: Diagnosis not present

## 2016-06-21 DIAGNOSIS — Z79899 Other long term (current) drug therapy: Secondary | ICD-10-CM | POA: Diagnosis not present

## 2016-06-21 DIAGNOSIS — M25569 Pain in unspecified knee: Secondary | ICD-10-CM | POA: Diagnosis not present

## 2016-06-21 DIAGNOSIS — J0110 Acute frontal sinusitis, unspecified: Secondary | ICD-10-CM | POA: Diagnosis not present

## 2016-06-21 DIAGNOSIS — J0111 Acute recurrent frontal sinusitis: Secondary | ICD-10-CM | POA: Insufficient documentation

## 2016-06-21 MED ORDER — HYDROCHLOROTHIAZIDE 12.5 MG PO CAPS: 12.5000 mg | ORAL_CAPSULE | Freq: Every day | ORAL | 5 refills | Status: DC

## 2016-06-21 MED ORDER — DOXYCYCLINE HYCLATE 100 MG PO TABS: 100.0000 mg | ORAL_TABLET | Freq: Two times a day (BID) | ORAL | 0 refills | Status: AC

## 2016-06-21 MED ORDER — DOXYCYCLINE HYCLATE 100 MG PO TABS: 100.0000 mg | ORAL_TABLET | Freq: Two times a day (BID) | ORAL | 0 refills | Status: DC

## 2016-06-21 NOTE — Progress Notes (Signed)
Subjective:  Patient ID: Maria Oliver, female    DOB: August 28, 1960  Age: 56 y.o. MRN: [id number]  CC: Hypertension and Sinus Problem   HPI [name] presents for    1.HTN. Taking HCTZ 25 mg daily. Having dizziness at time. No CP, SOB or leg swelling. Smoker.   2. Sinus problem: x 7-10 days. Dizziness and pain behind R ear. Congestion. Watery eyes. No fever or chills.    3. R distal radial fracture: open reduction and fixation. Surgery done by Dr. Orlan Leavens with [facility]. She is R handed. Receiving weekly PT. Pain control done by Dr. Melvyn Novas.  4. Knee pain: this is chronic. She would like to see ortho for persistent pain in both knees that is worse on the L side.  Social History  Substance Use Topics  . Smoking status: Current Every Day Smoker    Packs/day: 0.50    Years: 40.00    Types: Cigarettes    Last attempt to quit: 03/08/2016  . Smokeless tobacco: Never Used     Comment: Recently quit, started back to 4 cigs/daily  . Alcohol use No    Outpatient Medications Prior to Visit  Medication Sig Dispense Refill  . albuterol (PROVENTIL HFA;VENTOLIN HFA) 108 (90 Base) MCG/ACT inhaler Inhale 2 puffs into the lungs every 4 (four) hours as needed for wheezing or shortness of breath. 1 Inhaler 0  . cholecalciferol (VITAMIN D) 1000 units tablet Take 1,000 Units by mouth daily.    . hydrochlorothiazide (HYDRODIURIL) 25 MG tablet TAKE 1 TABLET BY MOUTH DAILY. 30 tablet 0  . HYDROmorphone (DILAUDID) 2 MG tablet Take 1 tablet (2 mg total) by mouth every 4 (four) hours as needed for severe pain. 20 tablet 0  . ibuprofen (ADVIL,MOTRIN) 200 MG tablet Take 400 mg by mouth every 6 (six) hours as needed for moderate pain.     . mometasone-formoterol (DULERA) 200-5 MCG/ACT AERO Inhale 2 puffs into the lungs 2 (two) times daily. 1 Inhaler 5  . Multiple Vitamin (MULTIVITAMIN WITH MINERALS) TABS tablet Take 1 tablet by mouth every other day.    Marland Kitchen PARoxetine (PAXIL) 40 MG tablet  Take 1 tablet (40 mg total) by mouth every morning. 30 tablet 2  . tiotropium (SPIRIVA HANDIHALER) 18 MCG inhalation capsule Place 1 capsule (18 mcg total) into inhaler and inhale daily. 90 capsule 3  . VENTOLIN HFA 108 (90 Base) MCG/ACT inhaler Inhale 2 puffs into the lungs every 4 (four) hours as needed for shortness of breath.   3  . azithromycin (ZITHROMAX) 250 MG tablet 2 tabs today Then 1 tab daily on days 2-5 (Patient not taking: Reported on 06/21/2016) 6 tablet 0  . nicotine (NICOTROL) 10 MG inhaler Inhale 1 Cartridge (1 continuous puffing total) into the lungs as needed for smoking cessation. (Patient not taking: Reported on 06/21/2016) 42 each 4  . potassium chloride SA (K-DUR,KLOR-CON) 20 MEQ tablet Take 2 tablets (40 mEq total) by mouth 2 (two) times daily. 28 tablet 0   No facility-administered medications prior to visit.     ROS Review of Systems  Constitutional: Negative for chills and fever.  Eyes: Negative for visual disturbance.  Respiratory: Negative for shortness of breath.   Cardiovascular: Negative for chest pain.  Gastrointestinal: Negative for abdominal pain and blood in stool.  Musculoskeletal: Positive for arthralgias. Negative for back pain.  Skin: Negative for rash.  Allergic/Immunologic: Negative for immunocompromised state.  Hematological: Negative for adenopathy. Does not bruise/bleed easily.  Psychiatric/Behavioral: Negative  for dysphoric mood and suicidal ideas.    Objective:  BP 106/74 (BP Location: Left Arm, Patient Position: Sitting, Cuff Size: Small)   Pulse 78   Temp 97.9 F (36.6 C) (Oral)   Ht 5\' 4"  (1.626 m)   Wt 142 lb 3.2 oz (64.5 kg)   SpO2 95%   BMI 24.41 kg/m   BP/Weight 06/21/2016 05/11/2016 05/08/2016  Systolic BP 106 144 124  Diastolic BP 74 77 86  Wt. (Lbs) 142.2 139 144.13  BMI 24.41 23.86 24.74   Physical Exam  Constitutional: She is oriented to person, place, and time. She appears well-developed and well-nourished. No distress.   HENT:  Head: Normocephalic and atraumatic.  Ears:  Nose: Mucosal edema (on L side ) present.  Mouth/Throat: Oropharynx is clear and moist.  Cardiovascular: Normal rate, regular rhythm, normal heart sounds and intact distal pulses.   Pulmonary/Chest: Effort normal and breath sounds normal.  Musculoskeletal: She exhibits no edema.       Arms: Neurological: She is alert and oriented to person, place, and time.  Skin: Skin is warm and dry. No rash noted.  Psychiatric: She has a normal mood and affect.    Assessment & Plan:  [name] was seen today for hypertension and sinus problem.  Diagnoses and all orders for this visit:  Acute non-recurrent frontal sinusitis -     Discontinue: doxycycline (VIBRA-TABS) 100 MG tablet; Take 1 tablet (100 mg total) by mouth 2 (two) times daily. -     doxycycline (VIBRA-TABS) 100 MG tablet; Take 1 tablet (100 mg total) by mouth 2 (two) times daily.  Essential hypertension -     Discontinue: hydrochlorothiazide (MICROZIDE) 12.5 MG capsule; Take 1 capsule (12.5 mg total) by mouth daily. -     hydrochlorothiazide (MICROZIDE) 12.5 MG capsule; Take 1 capsule (12.5 mg total) by mouth daily.  Chronic knee pain, unspecified laterality -     Ambulatory referral to Orthopedic Surgery   There are no diagnoses linked to this encounter.  No orders of the defined types were placed in this encounter.   Follow-up: Return in about 3 months (around 09/19/2016) for HTN .   Dessa PhiJosalyn Brecklyn Galvis MD

## 2016-06-21 NOTE — Patient Instructions (Addendum)
Diagnoses and all orders for this visit:  Acute non-recurrent frontal sinusitis -     doxycycline (VIBRA-TABS) 100 MG tablet; Take 1 tablet (100 mg total) by mouth 2 (two) times daily.  Essential hypertension -     hydrochlorothiazide (MICROZIDE) 12.5 MG capsule; Take 1 capsule (12.5 mg total) by mouth daily.  Chronic knee pain, unspecified laterality   Dr.Ortman does subspecialize in hand, wrist in elbow  I have placed another referral to ortho for your knees  F/u in 3 months for HTN   Dr. Armen PickupFunches

## 2016-06-21 NOTE — Progress Notes (Signed)
Pt needs refill on HCTZ.

## 2016-06-21 NOTE — Assessment & Plan Note (Signed)
Normotensive with slight low potassium on HCTZ Decrease HCTZ to 12.5 mg daily

## 2016-06-21 NOTE — Assessment & Plan Note (Signed)
Course of amoxicillin  

## 2016-07-02 ENCOUNTER — Ambulatory Visit (INDEPENDENT_AMBULATORY_CARE_PROVIDER_SITE_OTHER): Payer: Self-pay | Admitting: Orthopaedic Surgery

## 2016-07-21 DIAGNOSIS — F332 Major depressive disorder, recurrent severe without psychotic features: Secondary | ICD-10-CM | POA: Diagnosis not present

## 2016-08-03 ENCOUNTER — Encounter (HOSPITAL_COMMUNITY): Payer: Self-pay

## 2016-08-03 ENCOUNTER — Emergency Department (HOSPITAL_COMMUNITY): Payer: Medicare Other

## 2016-08-03 ENCOUNTER — Emergency Department (HOSPITAL_COMMUNITY)
Admission: EM | Admit: 2016-08-03 | Discharge: 2016-08-03 | Disposition: A | Discharge status: Home / Self Care | Payer: Medicare Other | Attending: Emergency Medicine | Admitting: Emergency Medicine

## 2016-08-03 DIAGNOSIS — K469 Unspecified abdominal hernia without obstruction or gangrene: Secondary | ICD-10-CM | POA: Diagnosis not present

## 2016-08-03 DIAGNOSIS — F17210 Nicotine dependence, cigarettes, uncomplicated: Secondary | ICD-10-CM | POA: Diagnosis not present

## 2016-08-03 DIAGNOSIS — J449 Chronic obstructive pulmonary disease, unspecified: Secondary | ICD-10-CM | POA: Diagnosis not present

## 2016-08-03 DIAGNOSIS — I10 Essential (primary) hypertension: Secondary | ICD-10-CM | POA: Diagnosis not present

## 2016-08-03 DIAGNOSIS — R1032 Left lower quadrant pain: Secondary | ICD-10-CM | POA: Diagnosis not present

## 2016-08-03 DIAGNOSIS — M545 Low back pain, unspecified: Secondary | ICD-10-CM

## 2016-08-03 DIAGNOSIS — R109 Unspecified abdominal pain: Secondary | ICD-10-CM | POA: Diagnosis present

## 2016-08-03 HISTORY — DX: Unspecified asthma, uncomplicated: J45.909

## 2016-08-03 LAB — URINALYSIS, ROUTINE W REFLEX MICROSCOPIC
Bilirubin Urine: NEGATIVE
GLUCOSE, UA: NEGATIVE mg/dL
Hgb urine dipstick: NEGATIVE
Ketones, ur: NEGATIVE mg/dL
LEUKOCYTES UA: NEGATIVE
NITRITE: NEGATIVE
PH: 6 (ref 5.0–8.0)
Protein, ur: NEGATIVE mg/dL
SPECIFIC GRAVITY, URINE: 1.024 (ref 1.005–1.030)

## 2016-08-03 LAB — COMPREHENSIVE METABOLIC PANEL
ALBUMIN: 4.1 g/dL (ref 3.5–5.0)
ALT: 15 U/L (ref 14–54)
ANION GAP: 9 (ref 5–15)
AST: 22 U/L (ref 15–41)
Alkaline Phosphatase: 72 U/L (ref 38–126)
BILIRUBIN TOTAL: 0.5 mg/dL (ref 0.3–1.2)
BUN: 8 mg/dL (ref 6–20)
CHLORIDE: 103 mmol/L (ref 101–111)
CO2: 27 mmol/L (ref 22–32)
Calcium: 9.4 mg/dL (ref 8.9–10.3)
Creatinine, Ser: 0.74 mg/dL (ref 0.44–1.00)
GFR calc Af Amer: >60 mL/min (ref >60)
GFR calc non Af Amer: >60 mL/min (ref >60)
GLUCOSE: 134 mg/dL — AB (ref 65–99)
POTASSIUM: 3.4 mmol/L — AB (ref 3.5–5.1)
Sodium: 139 mmol/L (ref 135–145)
TOTAL PROTEIN: 6.8 g/dL (ref 6.5–8.1)

## 2016-08-03 LAB — CBC
HEMATOCRIT: 44.1 % (ref 36.0–46.0)
HEMOGLOBIN: 14.7 g/dL (ref 12.0–15.0)
MCH: 29.2 pg (ref 26.0–34.0)
MCHC: 33.3 g/dL (ref 30.0–36.0)
MCV: 87.7 fL (ref 78.0–100.0)
Platelets: 322 10*3/uL (ref 150–400)
RBC: 5.03 MIL/uL (ref 3.87–5.11)
RDW: 14.6 % (ref 11.5–15.5)
WBC: 8.5 10*3/uL (ref 4.0–10.5)

## 2016-08-03 LAB — LIPASE, BLOOD: LIPASE: 22 U/L (ref 11–51)

## 2016-08-03 MED ORDER — KETOROLAC TROMETHAMINE 15 MG/ML IJ SOLN
15.0000 mg | Freq: Once | INTRAMUSCULAR | Status: AC
  Administered 2016-08-03: 15 mg via INTRAVENOUS
  Filled 2016-08-03: qty 1

## 2016-08-03 MED ORDER — SODIUM CHLORIDE 0.9 % IV BOLUS (SEPSIS)
1000.0000 mL | Freq: Once | INTRAVENOUS | Status: AC
  Administered 2016-08-03: 1000 mL via INTRAVENOUS

## 2016-08-03 MED ORDER — IOPAMIDOL (ISOVUE-300) INJECTION 61%
INTRAVENOUS | Status: AC
  Administered 2016-08-03: 100 mL
  Filled 2016-08-03: qty 100
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
## 2016-08-03 NOTE — Discharge Instructions (Signed)
Please read attached information. If you experience any new or worsening signs or symptoms please return to the emergency room for evaluation.  Please use Tylenol or ibuprofen as needed for pain.  Please follow-up with your primary care provider or specialist as discussed.

## 2016-08-03 NOTE — ED Notes (Signed)
In [place]

## 2016-08-03 NOTE — ED Provider Notes (Signed)
[facility]-EMERGENCY DEPT Provider Note   CSN: [id number] Arrival date & time: 08/03/16  1403     History   Chief Complaint Chief Complaint  Patient presents with  . Back Pain, generalized pain    HPI Maria Oliver D Klunder is a 56 y.o. female.  HPI   56 year old female presents today with complaints of back pain.  Patient notes symptoms started approximately 3 days ago with left flank and back pain radiating down into the left hip and suprapubic region.  Patient denies any dysuria, reports strong odorous urine.  She denies any fever, nausea, vomiting.  She notes today she was having some left lower quadrant abdominal pain as well.  She denies any distal neurological deficits.  She reports pain is worse with movement and palpation.   Past Medical History:  Diagnosis Date  . Abdominal pain   . Anxiety   . Arthritis   . Asthma   . COPD (chronic obstructive pulmonary disease) (HCC)   . Generalized headaches   . Hernia, inguinal, right   . Hypertension   . Leg swelling     Patient Active Problem List   Diagnosis Date Noted  . Acute non-recurrent frontal sinusitis 06/21/2016  . Onychomycosis of right great toe 10/13/2015  . Osteoarthritis 08/11/2015  . Bilateral flank pain 08/11/2015  . Chronic abdominal pain 05/22/2015  . Chronic pain syndrome 05/22/2015  . Vitamin D insufficiency 02/06/2015  . Chronic knee pain 02/06/2015  . Ventral hernia without obstruction or gangrene 12/31/2014  . Anxiety 12/31/2014  . COPD (chronic obstructive pulmonary disease) with emphysema (HCC)   . SMOKER 10/01/2009  . Essential hypertension 10/01/2009    Past Surgical History:  Procedure Laterality Date  . COLON SURGERY  2010   per patient "colon take down"  . COLOSTOMY  2010  . HERNIA REPAIR  2010  . TUBAL LIGATION      OB History    No data available       Home Medications    Prior to Admission medications   Medication Sig Start Date End Date Taking? Authorizing Provider  albuterol  (PROVENTIL HFA;VENTOLIN HFA) 108 (90 Base) MCG/ACT inhaler Inhale 2 puffs into the lungs every 4 (four) hours as needed for wheezing or shortness of breath. 03/19/16   Marily MemosJason Mesner, MD  cholecalciferol (VITAMIN D) 1000 units tablet Take 1,000 Units by mouth daily.    Historical Provider, MD  hydrochlorothiazide (MICROZIDE) 12.5 MG capsule Take 1 capsule (12.5 mg total) by mouth daily. 06/21/16   [name], MD  HYDROmorphone (DILAUDID) 2 MG tablet Take 1 tablet (2 mg total) by mouth every 4 (four) hours as needed for severe pain. 05/08/16   Hope Orlene OchM Neese, NP  ibuprofen (ADVIL,MOTRIN) 200 MG tablet Take 400 mg by mouth every 6 (six) hours as needed for moderate pain.     Historical Provider, MD  mometasone-formoterol (DULERA) 200-5 MCG/ACT AERO Inhale 2 puffs into the lungs 2 (two) times daily. 04/27/16   Lupita Leashouglas B McQuaid, MD  Multiple Vitamin (MULTIVITAMIN WITH MINERALS) TABS tablet Take 1 tablet by mouth every other day.    Historical Provider, MD  PARoxetine (PAXIL) 40 MG tablet Take 1 tablet (40 mg total) by mouth every morning. 04/27/16   [name], MD  potassium chloride SA (K-DUR,KLOR-CON) 20 MEQ tablet Take 2 tablets (40 mEq total) by mouth 2 (two) times daily. 03/19/16 04/27/16  Marily MemosJason Mesner, MD  tiotropium (SPIRIVA HANDIHALER) 18 MCG inhalation capsule Place 1 capsule (18 mcg total) into inhaler and  inhale daily. 04/22/16   [name], MD  VENTOLIN HFA 108 (90 Base) MCG/ACT inhaler Inhale 2 puffs into the lungs every 4 (four) hours as needed for shortness of breath.  10/06/15   Historical Provider, MD    Family History Family History  Problem Relation Age of Onset  . Heart disease Mother 7  . Heart failure Mother   . Asthma Mother   . Emphysema Mother   . Alzheimer's disease Father 26  . Emphysema Brother   . Asthma Brother   . Colon cancer Neg Hx     Social History Social History  Substance Use Topics  . Smoking status: Current Every Day Smoker    Packs/day: 0.50     Years: 40.00    Types: Cigarettes    Last attempt to quit: 03/08/2016  . Smokeless tobacco: Never Used     Comment: Recently quit, started back to 4 cigs/daily  . Alcohol use No     Allergies   Aspirin; Ciprofloxacin; Codeine; Eggs or egg-derived products; Lactose intolerance (gi); Percocet [oxycodone-acetaminophen]; and Penicillins   Review of Systems Review of Systems  All other systems reviewed and are negative.    Physical Exam Updated Vital Signs BP 115/69   Pulse 81   Temp 98.3 F (36.8 C) (Oral)   Resp 18   SpO2 97%   Physical Exam  Constitutional: She is oriented to person, place, and time. She appears well-developed and well-nourished.  HENT:  Head: Normocephalic and atraumatic.  Eyes: Conjunctivae are normal. Pupils are equal, round, and reactive to light. Right eye exhibits no discharge. Left eye exhibits no discharge. No scleral icterus.  Neck: Normal range of motion. No JVD present. No tracheal deviation present.  Pulmonary/Chest: Effort normal. No stridor.  Abdominal: Soft.  Minor left-sided lower quadrant pain  Musculoskeletal:  No CT or L-spine tenderness.  Tenderness palpation of the left lateral lumbar soft tissue and upper hip.  Distal sensation strength and motor function intact.  Neurological: She is alert and oriented to person, place, and time. Coordination normal.  Psychiatric: She has a normal mood and affect. Her behavior is normal. Judgment and thought content normal.  Nursing note and vitals reviewed.   ED Treatments / Results  Labs (all labs ordered are listed, but only abnormal results are displayed) Labs Reviewed  COMPREHENSIVE METABOLIC PANEL - Abnormal; Notable for the following:       Result Value   Potassium 3.4 (*)    Glucose, Bld 134 (*)    All other components within normal limits  LIPASE, BLOOD  CBC  URINALYSIS, ROUTINE W REFLEX MICROSCOPIC    EKG  EKG Interpretation None       Radiology Ct Abdomen Pelvis W  Contrast  Result Date: 08/03/2016 CLINICAL DATA:  Acute onset of left lower quadrant abdominal pain, radiating to the back. Initial encounter. EXAM: CT ABDOMEN AND PELVIS WITH CONTRAST TECHNIQUE: Multidetector CT imaging of the abdomen and pelvis was performed using the standard protocol following bolus administration of intravenous contrast. CONTRAST:  100 mL ISOVUE-300 IOPAMIDOL (ISOVUE-300) INJECTION 61% COMPARISON:  CT of the abdomen and pelvis from 01/14/2016 FINDINGS: Lower chest: Minimal right basilar atelectasis is noted. The visualized portions of the mediastinum are unremarkable. Hepatobiliary: The liver is unremarkable in appearance. The gallbladder is unremarkable in appearance. The common bile duct remains normal in caliber. Pancreas: The pancreas is within normal limits. Spleen: Vague small nonspecific hypodensities are seen within the spleen, similar in appearance to 2017. Adrenals/Urinary Tract: The  adrenal glands are unremarkable in appearance. Mild left renal scarring is noted, with associated cyst. The kidneys are otherwise unremarkable. There is no evidence of hydronephrosis. No renal or ureteral stones are identified. Minimal left-sided perinephric stranding is seen. Stomach/Bowel: The colon is grossly unremarkable in appearance. The appendix is grossly unremarkable. Multiple small bowel loops and a portion of the transverse colon are noted within a large right lower quadrant anterior abdominal wall hernia, without evidence for obstruction or strangulation. The stomach is grossly unremarkable in appearance. Vascular/Lymphatic: Scattered calcification is seen along the abdominal aorta and its branches. The abdominal aorta is otherwise grossly unremarkable. The inferior vena cava is grossly unremarkable. No retroperitoneal lymphadenopathy is seen. No pelvic sidewall lymphadenopathy is identified. Reproductive: The bladder is mildly distended and grossly unremarkable. The uterus is unremarkable.  The ovaries are relatively symmetric. No suspicious adnexal masses are seen. Other: No additional soft tissue abnormalities are seen. Musculoskeletal: No acute osseous abnormalities are identified. A vertebral body hemangioma is noted at L3. The visualized musculature is unremarkable in appearance. IMPRESSION: 1. No acute abnormality seen to explain the patient's symptoms. 2. Large anterior abdominal wall hernia again noted at the right lower quadrant, containing small and large bowel loops, without evidence for obstruction or strangulation. 3. Scattered aortic atherosclerosis. 4. Vague small nonspecific hypodensities within the spleen are similar appearance to 2017. 5. Mild left renal scarring, with associated cyst. 6. 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 Electronically Signed   By: Roanna Raider M.D.   On: 08/03/2016 18:15    Procedures Procedures (including critical care time)  Medications Ordered in ED Medications  sodium chloride 0.9 % bolus 1,000 mL (0 mLs Intravenous Stopped 08/03/16 2022)  iopamidol (ISOVUE-300) 61 % injection (100 mLs  Contrast Given 08/03/16 1738)  ketorolac (TORADOL) 15 MG/ML injection 15 mg (15 mg Intravenous Given 08/03/16 2022)     Initial Impression / Assessment and Plan / ED Course  I have reviewed the triage vital signs and the nursing notes.  Pertinent labs & imaging results that were available during my care of the patient were reviewed by me and considered in my medical decision making (see chart for details).     Final Clinical Impressions(s) / ED Diagnoses   Final diagnoses:  LLQ abdominal pain  Acute left-sided low back pain without sciatica    Labs: Urinalysis, lipase, CMP, CBC  Imaging: CT abdomen and pelvis with contrast  Consults:  Therapeutics: Toradol  Discharge Meds:   Assessment/Plan: 56 year old female presents today with complaints of back and abdominal pain.  She is exquisitely tender to palpation of her lower back.  I suspect this is  low back pain dating into her hip.  She has very reassuring workup here with reassuring vital signs, reassuring laboratory analysis, and CT scan with no significant explanation of her symptoms.  Patient will be given Toradol, discharged home with primary care follow-up and strict return precautions.  She verbalized understanding and agreement to today's plan and had no further questions or concerns at home discharge      New Prescriptions New Prescriptions   No medications on file     Eyvonne Mechanic, PA-C 08/03/16 2029    Benjiman Core, MD 08/03/16 [phone]

## 2016-08-05 ENCOUNTER — Emergency Department (HOSPITAL_COMMUNITY): Payer: Medicare Other

## 2016-08-05 ENCOUNTER — Encounter (HOSPITAL_COMMUNITY): Payer: Self-pay

## 2016-08-05 ENCOUNTER — Emergency Department (HOSPITAL_COMMUNITY)
Admission: EM | Admit: 2016-08-05 | Discharge: 2016-08-06 | Disposition: A | Discharge status: Home / Self Care | Payer: Medicare Other | Attending: Emergency Medicine | Admitting: Emergency Medicine

## 2016-08-05 DIAGNOSIS — F17210 Nicotine dependence, cigarettes, uncomplicated: Secondary | ICD-10-CM | POA: Insufficient documentation

## 2016-08-05 DIAGNOSIS — R109 Unspecified abdominal pain: Secondary | ICD-10-CM | POA: Diagnosis not present

## 2016-08-05 DIAGNOSIS — I10 Essential (primary) hypertension: Secondary | ICD-10-CM | POA: Diagnosis not present

## 2016-08-05 DIAGNOSIS — J449 Chronic obstructive pulmonary disease, unspecified: Secondary | ICD-10-CM | POA: Diagnosis not present

## 2016-08-05 DIAGNOSIS — K8020 Calculus of gallbladder without cholecystitis without obstruction: Secondary | ICD-10-CM

## 2016-08-05 DIAGNOSIS — K8050 Calculus of bile duct without cholangitis or cholecystitis without obstruction: Secondary | ICD-10-CM

## 2016-08-05 DIAGNOSIS — K8051 Calculus of bile duct without cholangitis or cholecystitis with obstruction: Secondary | ICD-10-CM | POA: Insufficient documentation

## 2016-08-05 DIAGNOSIS — R1010 Upper abdominal pain, unspecified: Secondary | ICD-10-CM | POA: Diagnosis not present

## 2016-08-05 DIAGNOSIS — Z79899 Other long term (current) drug therapy: Secondary | ICD-10-CM | POA: Diagnosis not present

## 2016-08-05 LAB — URINALYSIS, ROUTINE W REFLEX MICROSCOPIC
Glucose, UA: NEGATIVE mg/dL
Leukocytes, UA: NEGATIVE
NITRITE: NEGATIVE
Protein, ur: 30 mg/dL — AB
Specific Gravity, Urine: >1.03 — ABNORMAL HIGH (ref 1.005–1.030)
pH: 6 (ref 5.0–8.0)

## 2016-08-05 LAB — CBC
HCT: 43.3 % (ref 36.0–46.0)
HEMOGLOBIN: 14.5 g/dL (ref 12.0–15.0)
MCH: 28.7 pg (ref 26.0–34.0)
MCHC: 33.5 g/dL (ref 30.0–36.0)
MCV: 85.6 fL (ref 78.0–100.0)
PLATELETS: 328 10*3/uL (ref 150–400)
RBC: 5.06 MIL/uL (ref 3.87–5.11)
RDW: 14.1 % (ref 11.5–15.5)
WBC: 11.6 10*3/uL — ABNORMAL HIGH (ref 4.0–10.5)

## 2016-08-05 LAB — COMPREHENSIVE METABOLIC PANEL
ALT: 16 U/L (ref 14–54)
AST: 22 U/L (ref 15–41)
Albumin: 4.5 g/dL (ref 3.5–5.0)
Alkaline Phosphatase: 67 U/L (ref 38–126)
Anion gap: 15 (ref 5–15)
BUN: 9 mg/dL (ref 6–20)
CALCIUM: 9.8 mg/dL (ref 8.9–10.3)
CHLORIDE: 107 mmol/L (ref 101–111)
CO2: 20 mmol/L — ABNORMAL LOW (ref 22–32)
CREATININE: 0.72 mg/dL (ref 0.44–1.00)
Glucose, Bld: 176 mg/dL — ABNORMAL HIGH (ref 65–99)
POTASSIUM: 3.3 mmol/L — AB (ref 3.5–5.1)
SODIUM: 142 mmol/L (ref 135–145)
TOTAL PROTEIN: 7.1 g/dL (ref 6.5–8.1)
Total Bilirubin: 0.5 mg/dL (ref 0.3–1.2)

## 2016-08-05 LAB — URINALYSIS, MICROSCOPIC (REFLEX): WBC, UA: NONE SEEN WBC/hpf (ref 0–5)

## 2016-08-05 LAB — LIPASE, BLOOD: LIPASE: 13 U/L (ref 11–51)

## 2016-08-05 LAB — I-STAT BETA HCG BLOOD, ED (MC, WL, AP ONLY): I-stat hCG, quantitative: <5 m[IU]/mL (ref <5)

## 2016-08-05 MED ORDER — ONDANSETRON HCL 4 MG/2ML IJ SOLN
4.0000 mg | Freq: Once | INTRAMUSCULAR | Status: AC
  Administered 2016-08-05: 4 mg via INTRAVENOUS
  Filled 2016-08-05: qty 2

## 2016-08-05 MED ORDER — ONDANSETRON 4 MG PO TBDP
4.0000 mg | ORAL_TABLET | Freq: Once | ORAL | Status: AC | PRN
  Administered 2016-08-05: 4 mg via ORAL

## 2016-08-05 MED ORDER — HYDROMORPHONE HCL 2 MG/ML IJ SOLN
1.0000 mg | Freq: Once | INTRAMUSCULAR | Status: AC
Start: 2016-08-05 — End: 2016-08-05
  Administered 2016-08-05: 1 mg via INTRAVENOUS
  Filled 2016-08-05: qty 1

## 2016-08-05 MED ORDER — HYDROCODONE-ACETAMINOPHEN 5-325 MG PO TABS: 1.0000 | ORAL_TABLET | ORAL | 0 refills | Status: DC | PRN

## 2016-08-05 MED ORDER — SODIUM CHLORIDE 0.9 % IV BOLUS (SEPSIS)
1000.0000 mL | Freq: Once | INTRAVENOUS | Status: AC
  Administered 2016-08-05: 1000 mL via INTRAVENOUS

## 2016-08-05 MED ORDER — ONDANSETRON 4 MG PO TBDP
ORAL_TABLET | ORAL | Status: AC
  Filled 2016-08-05: qty 1

## 2016-08-05 MED ORDER — ONDANSETRON 8 MG PO TBDP: 8.0000 mg | ORAL_TABLET | Freq: Three times a day (TID) | ORAL | 0 refills | Status: DC | PRN

## 2016-08-05 NOTE — ED Provider Notes (Signed)
[facility]PT Provider Note   CSN: [id number] Arrival date & time: 08/05/16  1558     History   Chief Complaint Chief Complaint  Patient presents with  . Abdominal Pain    HPI CARESSA SCEARCE is a 56 y.o. female.  HPI  Patient presents the emergency department with complaints of ongoing severe upper abdominal pain with associated nausea vomiting.  She also reports some mild upper back pain.  She does report a known history of ventral hernia.  She was seen 2 days ago in emergency department as CT scan which demonstrate no significant abnormality.  She reports she was discharged home and continues having intermittent pain at this time.  No hematemesis.  No melena or hematochezia.  Denies fevers and chills.  No lower abdominal pain.  Denies abdominal swelling.  No prior history of gallstones.  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  Symptoms are currently severe in severity  Past Medical History:  Diagnosis Date  . Abdominal pain   . Anxiety   . Arthritis   . Asthma   . COPD (chronic obstructive pulmonary disease) (HCC)   . Generalized headaches   . Hernia, inguinal, right   . Hypertension   . Leg swelling     Patient Active Problem List   Diagnosis Date Noted  . Acute non-recurrent frontal sinusitis 06/21/2016  . Onychomycosis of right great toe 10/13/2015  . Osteoarthritis 08/11/2015  . Bilateral flank pain 08/11/2015  . Chronic abdominal pain 05/22/2015  . Chronic pain syndrome 05/22/2015  . Vitamin D insufficiency 02/06/2015  . Chronic knee pain 02/06/2015  . Ventral hernia without obstruction or gangrene 12/31/2014  . Anxiety 12/31/2014  . COPD (chronic obstructive pulmonary disease) with emphysema (HCC)   . SMOKER 10/01/2009  . Essential hypertension 10/01/2009    Past Surgical History:  Procedure Laterality Date  . COLON SURGERY  2010   per patient "colon take down"  . COLOSTOMY  2010  . HERNIA REPAIR   2010  . TUBAL LIGATION      OB History    No data available       Home Medications    Prior to Admission medications   Medication Sig Start Date End Date Taking? Authorizing Provider  albuterol (PROVENTIL HFA;VENTOLIN HFA) 108 (90 Base) MCG/ACT inhaler Inhale 2 puffs into the lungs every 4 (four) hours as needed for wheezing or shortness of breath. 03/19/16  Yes Marily Memos, MD  cholecalciferol (VITAMIN D) 1000 units tablet Take 1,000 Units by mouth every other day.    Yes Historical Provider, MD  hydrochlorothiazide (MICROZIDE) 12.5 MG capsule Take 1 capsule (12.5 mg total) by mouth daily. 06/21/16  Yes [name], MD  ibuprofen (ADVIL,MOTRIN) 200 MG tablet Take 400 mg by mouth every 6 (six) hours as needed for moderate pain.    Yes Historical Provider, MD  mometasone-formoterol (DULERA) 200-5 MCG/ACT AERO Inhale 2 puffs into the lungs 2 (two) times daily. 04/27/16  Yes Lupita Leash, MD  Multiple Vitamin (MULTIVITAMIN WITH MINERALS) TABS tablet Take 1 tablet by mouth every other day.   Yes Historical Provider, MD  PARoxetine (PAXIL) 30 MG tablet Take 30 mg by mouth daily. 07/21/16  Yes Historical Provider, MD  tiotropium (SPIRIVA HANDIHALER) 18 MCG inhalation capsule Place 1 capsule (18 mcg total) into inhaler and inhale daily. 04/22/16  Yes [name], MD  HYDROcodone-acetaminophen (NORCO/VICODIN) 5-325 MG tablet Take 1 tablet by mouth every 4 (four) hours as  needed for moderate pain. 08/05/16   Azalia Bilis, MD  ondansetron (ZOFRAN ODT) 8 MG disintegrating tablet Take 1 tablet (8 mg total) by mouth every 8 (eight) hours as needed for nausea or vomiting. 08/05/16   Azalia Bilis, MD    Family History Family History  Problem Relation Age of Onset  . Heart disease Mother 67  . Heart failure Mother   . Asthma Mother   . Emphysema Mother   . Alzheimer's disease Father 30  . Emphysema Brother   . Asthma Brother   . Colon cancer Neg Hx     Social History Social History    Substance Use Topics  . Smoking status: Current Every Day Smoker    Packs/day: 0.50    Years: 40.00    Types: Cigarettes    Last attempt to quit: 03/08/2016  . Smokeless tobacco: Never Used     Comment: Recently quit, started back to 4 cigs/daily  . Alcohol use No     Allergies   Aspirin; Ciprofloxacin; Codeine; Eggs or egg-derived products; Lactose intolerance (gi); Penicillins; and Percocet [oxycodone-acetaminophen]   Review of Systems Review of Systems  All other systems reviewed and are negative.    Physical Exam Updated Vital Signs BP 105/56   Pulse 75   Temp 97.5 F (36.4 C) (Oral)   Resp 22   SpO2 95%   Physical Exam  Constitutional: She is oriented to person, place, and time. She appears well-developed and well-nourished. No distress.  Uncomfortable appearing  HENT:  Head: Normocephalic and atraumatic.  Eyes: EOM are normal.  Neck: Normal range of motion.  Cardiovascular: Normal rate, regular rhythm and normal heart sounds.   Pulmonary/Chest: Effort normal and breath sounds normal.  Abdominal: Soft. She exhibits no distension. There is no tenderness.  Musculoskeletal: Normal range of motion.  Neurological: She is alert and oriented to person, place, and time. Sensory deficit: .now.  Skin: Skin is warm and dry.  Psychiatric: She has a normal mood and affect. Judgment normal.  Nursing note and vitals reviewed.    ED Treatments / Results  Labs (all labs ordered are listed, but only abnormal results are displayed) Labs Reviewed  COMPREHENSIVE METABOLIC PANEL - Abnormal; Notable for the following:       Result Value   Potassium 3.3 (*)    CO2 20 (*)    Glucose, Bld 176 (*)    All other components within normal limits  CBC - Abnormal; Notable for the following:    WBC 11.6 (*)    All other components within normal limits  URINALYSIS, ROUTINE W REFLEX MICROSCOPIC - Abnormal; Notable for the following:    Specific Gravity, Urine >1.030 (*)    Hgb urine  dipstick TRACE (*)    Bilirubin Urine SMALL (*)    Ketones, ur >80 (*)    Protein, ur 30 (*)    All other components within normal limits  URINALYSIS, MICROSCOPIC (REFLEX) - Abnormal; Notable for the following:    Bacteria, UA RARE (*)    Squamous Epithelial / LPF 0-5 (*)    All other components within normal limits  LIPASE, BLOOD  I-STAT BETA HCG BLOOD, ED ([facility], [facility], [facility] ONLY)    EKG  EKG Interpretation None       Radiology Dg Abd 2 Views  Result Date: 08/05/2016 CLINICAL DATA:  Abdominal pain EXAM: ABDOMEN - 2 VIEW COMPARISON:  CT abdomen pelvis 08/03/2016 FINDINGS: There is a paucity of small bowel gas. No mass or calculi  are identified. IMPRESSION: Paucity of small bowel gas without radiographic evidence of obstruction. Electronically Signed   By: Deatra RobinsonKevin  Herman M.D.   On: 08/05/2016 22:00   [place] Abdomen Limited Ruq  Result Date: 08/05/2016 CLINICAL DATA:  56 y/o  F; 2 days of abdomen and back pain. EXAM: US ABDOMEN LIMITED - RIGHT UPPER QUADRANT COMPARISON:  08/03/2016 CT of the abdomen and pelvis. FINDINGS: Gallbladder: Mobile gallstones measuring up to 6 mm. No gallbladder wall thickening or pericholecystic fluid and negative sonographic Murphy sign. Common bile duct: Diameter: 3.5 mm Liver: No focal lesion identified. Within normal limits in parenchymal echogenicity. IMPRESSION: Cholelithiasis without evidence for acute cholecystitis. Electronically Signed   By: Mitzi HansenLance  Furusawa-Stratton M.D.   On: 08/05/2016 21:01    Procedures Procedures (including critical care time)  Medications Ordered in ED Medications  ondansetron (ZOFRAN-ODT) 4 MG disintegrating tablet (not administered)  ondansetron (ZOFRAN-ODT) disintegrating tablet 4 mg (4 mg Oral Given 08/05/16 1625)  HYDROmorphone (DILAUDID) injection 1 mg (1 mg Intravenous Given 08/05/16 2022)  ondansetron (ZOFRAN) injection 4 mg (4 mg Intravenous Given 08/05/16 2022)  sodium chloride 0.9 % bolus 1,000 mL (1,000 mLs Intravenous New  Bag/Given 08/05/16 2022)     Initial Impression / Assessment and Plan / ED Course  I have reviewed the triage vital signs and the nursing notes.  Pertinent labs & imaging results that were available during my care of the patient were reviewed by me and considered in my medical decision making (see chart for details).     11:19 PM Patient feels much better this time.  Cholelithiasis found on ultrasound without evidence of cholecystitis.  LFTs and lipase are normal.  Patient feels much better.  Patient can be safely discharged home from the hospital with outpatient general surgery follow-up.  She understands to return to the ER for new or worsening symptoms  Final Clinical Impressions(s) / ED Diagnoses   Final diagnoses:  Abdominal pain  Pain of upper abdomen  Biliary colic  Gallstones    New Prescriptions New Prescriptions   HYDROCODONE-ACETAMINOPHEN (NORCO/VICODIN) 5-325 MG TABLET    Take 1 tablet by mouth every 4 (four) hours as needed for moderate pain.   ONDANSETRON (ZOFRAN ODT) 8 MG DISINTEGRATING TABLET    Take 1 tablet (8 mg total) by mouth every 8 (eight) hours as needed for nausea or vomiting.     Azalia BilisKevin Elo Marmolejos, MD 08/05/16 [phone]

## 2016-08-05 NOTE — ED Triage Notes (Addendum)
Pt reports abdominal pain, back pain, nausea, diaphoresis and vomiting. Reports she has a hernia as well.

## 2016-08-05 NOTE — ED Notes (Signed)
Updated patient on delays, apologized for wait.  Reassessed, patient continues to c/o abdominal pain.

## 2016-08-05 NOTE — ED Notes (Signed)
Pt. returned from XR. 

## 2016-08-05 NOTE — ED Notes (Signed)
Pt given ice chips

## 2016-08-10 ENCOUNTER — Telehealth: Payer: Self-pay | Admitting: Family Medicine

## 2016-08-10 DIAGNOSIS — K8020 Calculus of gallbladder without cholecystitis without obstruction: Secondary | ICD-10-CM

## 2016-08-10 NOTE — Telephone Encounter (Signed)
Patient came to the office to speak with PCP in regards to getting a referral to the specialist at [facility] in order to get her gallbladder out. Pt was in the ED on 2/22 and was advised to do so. Please follow up.  Thank you.

## 2016-08-11 NOTE — Telephone Encounter (Signed)
Will route to PCP 

## 2016-08-12 ENCOUNTER — Telehealth: Payer: Self-pay

## 2016-08-12 DIAGNOSIS — K8020 Calculus of gallbladder without cholecystitis without obstruction: Secondary | ICD-10-CM | POA: Insufficient documentation

## 2016-08-12 NOTE — Telephone Encounter (Signed)
Pt was called and informed of referral being placed. ?

## 2016-08-12 NOTE — Telephone Encounter (Signed)
Referral placed. Please inform patient.

## 2016-08-25 ENCOUNTER — Ambulatory Visit: Payer: Self-pay | Admitting: Surgery

## 2016-08-25 DIAGNOSIS — Z720 Tobacco use: Secondary | ICD-10-CM | POA: Diagnosis not present

## 2016-08-25 DIAGNOSIS — K432 Incisional hernia without obstruction or gangrene: Secondary | ICD-10-CM | POA: Diagnosis not present

## 2016-08-25 DIAGNOSIS — K8010 Calculus of gallbladder with chronic cholecystitis without obstruction: Secondary | ICD-10-CM | POA: Diagnosis not present
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
## 2016-08-26 ENCOUNTER — Ambulatory Visit: Payer: Medicare Other | Admitting: Pulmonary Disease

## 2016-08-27 DIAGNOSIS — S52531D Colles' fracture of right radius, subsequent encounter for closed fracture with routine healing: Secondary | ICD-10-CM | POA: Diagnosis not present

## 2016-09-02 ENCOUNTER — Emergency Department (HOSPITAL_COMMUNITY): Payer: Medicare Other

## 2016-09-02 ENCOUNTER — Encounter (HOSPITAL_COMMUNITY): Payer: Self-pay

## 2016-09-02 ENCOUNTER — Emergency Department (HOSPITAL_COMMUNITY)
Admission: EM | Admit: 2016-09-02 | Discharge: 2016-09-02 | Disposition: A | Discharge status: Home / Self Care | Payer: Medicare Other | Attending: Emergency Medicine | Admitting: Emergency Medicine

## 2016-09-02 DIAGNOSIS — M542 Cervicalgia: Secondary | ICD-10-CM | POA: Diagnosis not present

## 2016-09-02 DIAGNOSIS — J449 Chronic obstructive pulmonary disease, unspecified: Secondary | ICD-10-CM | POA: Insufficient documentation

## 2016-09-02 DIAGNOSIS — Z79899 Other long term (current) drug therapy: Secondary | ICD-10-CM | POA: Diagnosis not present

## 2016-09-02 DIAGNOSIS — I10 Essential (primary) hypertension: Secondary | ICD-10-CM | POA: Diagnosis not present

## 2016-09-02 DIAGNOSIS — S199XXA Unspecified injury of neck, initial encounter: Secondary | ICD-10-CM | POA: Diagnosis not present

## 2016-09-02 DIAGNOSIS — F17210 Nicotine dependence, cigarettes, uncomplicated: Secondary | ICD-10-CM | POA: Insufficient documentation

## 2016-09-02 LAB — CBC WITH DIFFERENTIAL/PLATELET
Basophils Absolute: 0 10*3/uL (ref 0.0–0.1)
Basophils Relative: 0 %
EOS PCT: 7 %
Eosinophils Absolute: 0.5 10*3/uL (ref 0.0–0.7)
HCT: 40.8 % (ref 36.0–46.0)
Hemoglobin: 13.3 g/dL (ref 12.0–15.0)
LYMPHS ABS: 2.6 10*3/uL (ref 0.7–4.0)
LYMPHS PCT: 34 %
MCH: 28.2 pg (ref 26.0–34.0)
MCHC: 32.6 g/dL (ref 30.0–36.0)
MCV: 86.6 fL (ref 78.0–100.0)
MONO ABS: 0.6 10*3/uL (ref 0.1–1.0)
Monocytes Relative: 8 %
Neutro Abs: 3.9 10*3/uL (ref 1.7–7.7)
Neutrophils Relative %: 51 %
PLATELETS: 267 10*3/uL (ref 150–400)
RBC: 4.71 MIL/uL (ref 3.87–5.11)
RDW: 14 % (ref 11.5–15.5)
WBC: 7.6 10*3/uL (ref 4.0–10.5)

## 2016-09-02 LAB — COMPREHENSIVE METABOLIC PANEL
ALT: 12 U/L — ABNORMAL LOW (ref 14–54)
AST: 15 U/L (ref 15–41)
Albumin: 3.4 g/dL — ABNORMAL LOW (ref 3.5–5.0)
Alkaline Phosphatase: 67 U/L (ref 38–126)
Anion gap: 7 (ref 5–15)
CHLORIDE: 106 mmol/L (ref 101–111)
CO2: 29 mmol/L (ref 22–32)
Calcium: 8.9 mg/dL (ref 8.9–10.3)
Creatinine, Ser: 0.6 mg/dL (ref 0.44–1.00)
GLUCOSE: 87 mg/dL (ref 65–99)
Potassium: 3.5 mmol/L (ref 3.5–5.1)
Sodium: 142 mmol/L (ref 135–145)
TOTAL PROTEIN: 5.8 g/dL — AB (ref 6.5–8.1)
Total Bilirubin: 0.3 mg/dL (ref 0.3–1.2)

## 2016-09-02 LAB — CK: CK TOTAL: 88 U/L (ref 38–234)

## 2016-09-02 MED ORDER — IBUPROFEN 600 MG PO TABS: 600.0000 mg | ORAL_TABLET | Freq: Four times a day (QID) | ORAL | 0 refills | Status: DC | PRN

## 2016-09-02 MED ORDER — METHOCARBAMOL 500 MG PO TABS
1000.0000 mg | ORAL_TABLET | Freq: Once | ORAL | Status: AC
  Administered 2016-09-02: 1000 mg via ORAL
  Filled 2016-09-02: qty 2

## 2016-09-02 MED ORDER — METHOCARBAMOL 500 MG PO TABS: 1000.0000 mg | ORAL_TABLET | Freq: Three times a day (TID) | ORAL | 0 refills | Status: DC | PRN
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
## 2016-09-07 ENCOUNTER — Encounter (HOSPITAL_COMMUNITY): Payer: Self-pay

## 2016-09-07 ENCOUNTER — Encounter (HOSPITAL_COMMUNITY)
Admission: RE | Admit: 2016-09-07 | Discharge: 2016-09-07 | Disposition: A | Discharge status: Home / Self Care | Payer: Medicare Other | Source: Ambulatory Visit | Attending: Surgery | Admitting: Surgery

## 2016-09-07 DIAGNOSIS — K660 Peritoneal adhesions (postprocedural) (postinfection): Secondary | ICD-10-CM | POA: Diagnosis not present

## 2016-09-07 DIAGNOSIS — I10 Essential (primary) hypertension: Secondary | ICD-10-CM | POA: Diagnosis not present

## 2016-09-07 DIAGNOSIS — F419 Anxiety disorder, unspecified: Secondary | ICD-10-CM | POA: Diagnosis not present

## 2016-09-07 DIAGNOSIS — Z79899 Other long term (current) drug therapy: Secondary | ICD-10-CM | POA: Diagnosis not present

## 2016-09-07 DIAGNOSIS — K811 Chronic cholecystitis: Secondary | ICD-10-CM | POA: Diagnosis present

## 2016-09-07 DIAGNOSIS — F329 Major depressive disorder, single episode, unspecified: Secondary | ICD-10-CM | POA: Diagnosis not present

## 2016-09-07 DIAGNOSIS — K432 Incisional hernia without obstruction or gangrene: Secondary | ICD-10-CM | POA: Diagnosis not present

## 2016-09-07 DIAGNOSIS — J439 Emphysema, unspecified: Secondary | ICD-10-CM | POA: Diagnosis not present

## 2016-09-07 DIAGNOSIS — K8010 Calculus of gallbladder with chronic cholecystitis without obstruction: Secondary | ICD-10-CM | POA: Diagnosis not present

## 2016-09-07 DIAGNOSIS — F17200 Nicotine dependence, unspecified, uncomplicated: Secondary | ICD-10-CM | POA: Diagnosis not present

## 2016-09-07 DIAGNOSIS — Z85038 Personal history of other malignant neoplasm of large intestine: Secondary | ICD-10-CM | POA: Diagnosis not present

## 2016-09-07 HISTORY — DX: Other complications of anesthesia, initial encounter: T88.59XA

## 2016-09-07 HISTORY — DX: Adverse effect of unspecified anesthetic, initial encounter: T41.45XA

## 2016-09-07 NOTE — ED Provider Notes (Signed)
[facility] Provider Note   CSN: [id number] Arrival date & time: 09/02/16  1703     History   Chief Complaint Chief Complaint  Patient presents with  . Neck Pain    HPI Maria Oliver is a 56 y.o. female.  HPI She presents with one month of right-sided neck pain. States the pain is worse with movement. No known injury. No focal weakness or numbness. Pain does not radiate down her arm. Denies any fever chills. Past Medical History:  Diagnosis Date  . Abdominal pain   . Anxiety   . Arthritis   . Asthma   . COPD (chronic obstructive pulmonary disease) (HCC)   . Generalized headaches   . Hernia, inguinal, right   . Hypertension   . Leg swelling     Patient Active Problem List   Diagnosis Date Noted  . Cholelithiases 08/12/2016  . Acute non-recurrent frontal sinusitis 06/21/2016  . Onychomycosis of right great toe 10/13/2015  . Osteoarthritis 08/11/2015  . Bilateral flank pain 08/11/2015  . Chronic abdominal pain 05/22/2015  . Chronic pain syndrome 05/22/2015  . Vitamin D insufficiency 02/06/2015  . Chronic knee pain 02/06/2015  . Ventral hernia without obstruction or gangrene 12/31/2014  . Anxiety 12/31/2014  . COPD (chronic obstructive pulmonary disease) with emphysema (HCC)   . SMOKER 10/01/2009  . Essential hypertension 10/01/2009    Past Surgical History:  Procedure Laterality Date  . COLON SURGERY  2010   per patient "colon take down"  . COLOSTOMY  2010  . HERNIA REPAIR  2010  . TUBAL LIGATION      OB History    No data available       Home Medications    Prior to Admission medications   Medication Sig Start Date End Date Taking? Authorizing Provider  albuterol (PROVENTIL HFA;VENTOLIN HFA) 108 (90 Base) MCG/ACT inhaler Inhale 2 puffs into the lungs every 4 (four) hours as needed for wheezing or shortness of breath. 03/19/16  Yes Marily Memos, MD  hydrochlorothiazide (MICROZIDE) 12.5 MG capsule Take 1 capsule (12.5 mg total) by mouth  daily. 06/21/16  Yes [name], MD  hydrOXYzine (ATARAX/VISTARIL) 10 MG tablet Take 10 mg by mouth 3 (three) times daily as needed for anxiety. 07/21/16  Yes Historical Provider, MD  mometasone-formoterol (DULERA) 200-5 MCG/ACT AERO Inhale 2 puffs into the lungs 2 (two) times daily. 04/27/16  Yes Lupita Leash, MD  Multiple Vitamin (MULTIVITAMIN WITH MINERALS) TABS tablet Take 1 tablet by mouth every other day.   Yes Historical Provider, MD  ondansetron (ZOFRAN ODT) 8 MG disintegrating tablet Take 1 tablet (8 mg total) by mouth every 8 (eight) hours as needed for nausea or vomiting. 08/05/16  Yes Azalia Bilis, MD  PARoxetine (PAXIL) 30 MG tablet Take 30 mg by mouth daily. 07/21/16  Yes Historical Provider, MD  tiotropium (SPIRIVA HANDIHALER) 18 MCG inhalation capsule Place 1 capsule (18 mcg total) into inhaler and inhale daily. 04/22/16  Yes [name], MD  cholecalciferol (VITAMIN D) 1000 units tablet Take 1,000 Units by mouth every other day.     Historical Provider, MD  HYDROcodone-acetaminophen (NORCO/VICODIN) 5-325 MG tablet Take 1 tablet by mouth every 4 (four) hours as needed for moderate pain. Patient not taking: Reported on 09/02/2016 08/05/16   Azalia Bilis, MD  ibuprofen (ADVIL,MOTRIN) 600 MG tablet Take 1 tablet (600 mg total) by mouth every 6 (six) hours as needed for moderate pain. 09/02/16   Loren Racer, MD  methocarbamol (ROBAXIN) 500 MG  tablet Take 2 tablets (1,000 mg total) by mouth every 8 (eight) hours as needed for muscle spasms. 09/02/16   Loren Racer, MD    Family History Family History  Problem Relation Age of Onset  . Heart disease Mother 22  . Heart failure Mother   . Asthma Mother   . Emphysema Mother   . Alzheimer's disease Father 74  . Emphysema Brother   . Asthma Brother   . Colon cancer Neg Hx     Social History Social History  Substance Use Topics  . Smoking status: Current Every Day Smoker    Packs/day: 0.50    Years: 40.00    Types:  Cigarettes    Last attempt to quit: 03/08/2016  . Smokeless tobacco: Never Used     Comment: Recently quit, started back to 4 cigs/daily  . Alcohol use No     Allergies   Aspirin; Ciprofloxacin; Codeine; Eggs or egg-derived products; Lactose intolerance (gi); Penicillins; and Percocet [oxycodone-acetaminophen]   Review of Systems Review of Systems  Constitutional: Negative for chills and fever.  HENT: Negative for trouble swallowing and voice change.   Respiratory: Negative for shortness of breath and wheezing.   Cardiovascular: Negative for chest pain.  Gastrointestinal: Negative for abdominal pain, nausea and vomiting.  Musculoskeletal: Positive for myalgias and neck pain. Negative for back pain and neck stiffness.  Skin: Negative for rash and wound.  Neurological: Negative for weakness, numbness and headaches.  All other systems reviewed and are negative.    Physical Exam Updated Vital Signs BP (!) 154/89   Pulse 67   Temp 98 F (36.7 C) (Oral)   Resp 17   Ht 5\' 4"  (1.626 m)   Wt 145 lb (65.8 kg)   SpO2 96%   BMI 24.89 kg/m   Physical Exam  Constitutional: She is oriented to person, place, and time. She appears well-developed and well-nourished.  HENT:  Head: Normocephalic and atraumatic.  Mouth/Throat: Oropharynx is clear and moist. No oropharyngeal exudate.  Eyes: EOM are normal. Pupils are equal, round, and reactive to light.  Neck: Normal range of motion. Neck supple.  No meningismus. No midline cervical tenderness to palpation. Patient does have right-sided paracervical and trapezius muscular tenderness to palpation. No lymphadenopathy.  Cardiovascular: Normal rate and regular rhythm.   Pulmonary/Chest: Effort normal and breath sounds normal.  Abdominal: Soft. Bowel sounds are normal. There is no tenderness. There is no rebound and no guarding.  Musculoskeletal: Normal range of motion. She exhibits no edema or tenderness.  Neurological: She is alert and  oriented to person, place, and time.  5/5 motor in all extremities. Sensation fully intact. Ambulating without difficulty.  Skin: Skin is warm and dry. Capillary refill takes less than 2 seconds. No rash noted. No erythema.  Psychiatric: She has a normal mood and affect. Her behavior is normal.  Nursing note and vitals reviewed.    ED Treatments / Results  Labs (all labs ordered are listed, but only abnormal results are displayed) Labs Reviewed  COMPREHENSIVE METABOLIC PANEL - Abnormal; Notable for the following:       Result Value   BUN <5 (*)    Total Protein 5.8 (*)    Albumin 3.4 (*)    ALT 12 (*)    All other components within normal limits  CBC WITH DIFFERENTIAL/PLATELET  CK    EKG  EKG Interpretation None       Radiology No results found.  Procedures Procedures (including critical care time)  Medications Ordered in ED Medications  methocarbamol (ROBAXIN) tablet 1,000 mg (1,000 mg Oral Given 09/02/16 2002)     Initial Impression / Assessment and Plan / ED Course  I have reviewed the triage vital signs and the nursing notes.  Pertinent labs & imaging results that were available during my care of the patient were reviewed by me and considered in my medical decision making (see chart for details).     Patient has mild degenerative changes in the mid cervical spine. There is also likely a muscular component contributing to her pain. We'll start on muscle relaxant and advised heat. Patient is to follow-up with her primary physician if symptoms persist. Return precautions given.  Final Clinical Impressions(s) / ED Diagnoses   Final diagnoses:  Neck pain    New Prescriptions Discharge Medication List as of 09/02/2016  9:21 PM    START taking these medications   Details  methocarbamol (ROBAXIN) 500 MG tablet Take 2 tablets (1,000 mg total) by mouth every 8 (eight) hours as needed for muscle spasms., Starting Thu 09/02/2016, Print         Loren Raceravid Li Fragoso,  MD 09/07/16 [phone]

## 2016-09-07 NOTE — Patient Instructions (Addendum)
Maria Oliver  09/07/2016   Your procedure is scheduled on: 09/09/2016    [facility]  Entrance take [facility]  elevators to 3rd floor to  Short Stay Center at    0530 AM.  Call this number if you have problems the morning of surgery [phone]   Remember: ONLY 1 PERSON MAY GO WITH YOU TO SHORT STAY TO GET  READY MORNING OF YOUR SURGERY.  Do not eat food or drink liquids :After Midnight.     Take these medicines the morning of surgery with A SIP OF WATER:   Inhalers as needed (may bring to hospital),                                You may not have any metal on your body including hair pins and              piercings  Do not wear jewelry, make-up, lotions, powders or perfumes, deodorant             Do not wear nail polish.  Do not shave  48 hours prior to surgery.                Do not bring valuables to the hospital. [facility] IS NOT             RESPONSIBLE   FOR VALUABLES.  Contacts, dentures or bridgework may not be worn into surgery.      Patients discharged the day of surgery will not be allowed to drive home.  Name and phone number of your driver:  Special Instructions: N/A              Please read over the following fact sheets you were given: _____________________________________________________________________             [facility] - Preparing for Surgery Before surgery, you can play an important role.  Because skin is not sterile, your skin needs to be as free of germs as possible.  You can reduce the number of germs on your skin by washing with CHG (chlorahexidine gluconate) soap before surgery.  CHG is an antiseptic cleaner which kills germs and bonds with the skin to continue killing germs even after washing. Please DO NOT use if you have an allergy to CHG or antibacterial soaps.  If your skin becomes reddened/irritated stop using the CHG and inform your nurse when you arrive at Short Stay. Do not shave (including legs and  underarms) for at least 48 hours prior to the first CHG shower.  You may shave your face/neck. Please follow these instructions carefully:  1.  Shower with CHG Soap the night before surgery and the  morning of Surgery.  2.  If you choose to wash your hair, wash your hair first as usual with your  normal  shampoo.  3.  After you shampoo, rinse your hair and body thoroughly to remove the  shampoo.                           4.  Use CHG as you would any other liquid soap.  You can apply chg directly  to the skin and wash  Gently with a scrungie or clean washcloth.  5.  Apply the CHG Soap to your body ONLY FROM THE NECK DOWN.   Do not use on face/ open                           Wound or open sores. Avoid contact with eyes, ears mouth and genitals (private parts).                       Wash face,  Genitals (private parts) with your normal soap.             6.  Wash thoroughly, paying special attention to the area where your surgery  will be performed.  7.  Thoroughly rinse your body with warm water from the neck down.  8.  DO NOT shower/wash with your normal soap after using and rinsing off  the CHG Soap.                9.  Pat yourself dry with a clean towel.            10.  Wear clean pajamas.            11.  Place clean sheets on your bed the night of your first shower and do not  sleep with pets. Day of Surgery : Do not apply any lotions/deodorants the morning of surgery.  Please wear clean clothes to the hospital/surgery center.  FAILURE TO FOLLOW THESE INSTRUCTIONS MAY RESULT IN THE CANCELLATION OF YOUR SURGERY PATIENT SIGNATURE_________________________________  NURSE SIGNATURE__________________________________  ________________________________________________________________________

## 2016-09-07 NOTE — Progress Notes (Signed)
CBC, CMP 09-02-16 epic

## 2016-09-08 ENCOUNTER — Ambulatory Visit: Payer: Medicare Other | Admitting: Pulmonary Disease

## 2016-09-08 MED ORDER — CLINDAMYCIN PHOSPHATE 900 MG/50ML IV SOLN
900.0000 mg | INTRAVENOUS | Status: AC
  Administered 2016-09-09: 900 mg via INTRAVENOUS

## 2016-09-08 MED ORDER — GENTAMICIN SULFATE 40 MG/ML IJ SOLN
5.0000 mg/kg | INTRAVENOUS | Status: AC
  Administered 2016-09-09 (×2): 300 mg via INTRAVENOUS
  Filled 2016-09-08: qty 7.5

## 2016-09-09 ENCOUNTER — Ambulatory Visit (HOSPITAL_COMMUNITY)
Admission: RE | Admit: 2016-09-09 | Discharge: 2016-09-09 | Disposition: A | Discharge status: Home / Self Care | Payer: Medicare Other | Source: Ambulatory Visit | Attending: Surgery | Admitting: Surgery

## 2016-09-09 ENCOUNTER — Ambulatory Visit (HOSPITAL_COMMUNITY): Payer: Medicare Other | Admitting: Anesthesiology

## 2016-09-09 ENCOUNTER — Encounter (HOSPITAL_COMMUNITY): Payer: Self-pay | Admitting: *Deleted

## 2016-09-09 ENCOUNTER — Ambulatory Visit (HOSPITAL_COMMUNITY): Payer: Medicare Other

## 2016-09-09 ENCOUNTER — Encounter (HOSPITAL_COMMUNITY)
Admission: RE | Disposition: A | Discharge status: Home / Self Care | Payer: Self-pay | Source: Ambulatory Visit | Attending: Surgery

## 2016-09-09 DIAGNOSIS — K660 Peritoneal adhesions (postprocedural) (postinfection): Secondary | ICD-10-CM | POA: Diagnosis not present

## 2016-09-09 DIAGNOSIS — Z85038 Personal history of other malignant neoplasm of large intestine: Secondary | ICD-10-CM | POA: Diagnosis not present

## 2016-09-09 DIAGNOSIS — Z8719 Personal history of other diseases of the digestive system: Secondary | ICD-10-CM

## 2016-09-09 DIAGNOSIS — F419 Anxiety disorder, unspecified: Secondary | ICD-10-CM | POA: Insufficient documentation

## 2016-09-09 DIAGNOSIS — I10 Essential (primary) hypertension: Secondary | ICD-10-CM | POA: Diagnosis not present

## 2016-09-09 DIAGNOSIS — F329 Major depressive disorder, single episode, unspecified: Secondary | ICD-10-CM | POA: Insufficient documentation

## 2016-09-09 DIAGNOSIS — J449 Chronic obstructive pulmonary disease, unspecified: Secondary | ICD-10-CM | POA: Diagnosis not present

## 2016-09-09 DIAGNOSIS — J439 Emphysema, unspecified: Secondary | ICD-10-CM | POA: Diagnosis not present

## 2016-09-09 DIAGNOSIS — F1190 Opioid use, unspecified, uncomplicated: Secondary | ICD-10-CM

## 2016-09-09 DIAGNOSIS — F17200 Nicotine dependence, unspecified, uncomplicated: Secondary | ICD-10-CM | POA: Insufficient documentation

## 2016-09-09 DIAGNOSIS — K811 Chronic cholecystitis: Secondary | ICD-10-CM | POA: Diagnosis not present

## 2016-09-09 DIAGNOSIS — K8010 Calculus of gallbladder with chronic cholecystitis without obstruction: Secondary | ICD-10-CM | POA: Diagnosis not present

## 2016-09-09 DIAGNOSIS — Z720 Tobacco use: Secondary | ICD-10-CM | POA: Diagnosis present

## 2016-09-09 DIAGNOSIS — G8929 Other chronic pain: Secondary | ICD-10-CM | POA: Diagnosis present

## 2016-09-09 DIAGNOSIS — Z79899 Other long term (current) drug therapy: Secondary | ICD-10-CM | POA: Insufficient documentation

## 2016-09-09 DIAGNOSIS — K432 Incisional hernia without obstruction or gangrene: Secondary | ICD-10-CM | POA: Diagnosis not present

## 2016-09-09 DIAGNOSIS — Z419 Encounter for procedure for purposes other than remedying health state, unspecified: Secondary | ICD-10-CM

## 2016-09-09 DIAGNOSIS — R109 Unspecified abdominal pain: Secondary | ICD-10-CM

## 2016-09-09 HISTORY — DX: Personal history of other diseases of the digestive system: Z87.19

## 2016-09-09 HISTORY — DX: Calculus of gallbladder with chronic cholecystitis without obstruction: K80.10

## 2016-09-09 HISTORY — PX: LAPAROSCOPIC CHOLECYSTECTOMY SINGLE SITE WITH INTRAOPERATIVE CHOLANGIOGRAM: SHX6538

## 2016-09-09 SURGERY — LAPAROSCOPIC CHOLECYSTECTOMY SINGLE SITE WITH INTRAOPERATIVE CHOLANGIOGRAM
Anesthesia: General | Site: Abdomen

## 2016-09-09 MED ORDER — CHLORHEXIDINE GLUCONATE CLOTH 2 % EX PADS: 6.0000 | MEDICATED_PAD | Freq: Once | CUTANEOUS | Status: DC

## 2016-09-09 MED ORDER — FENTANYL CITRATE (PF) 100 MCG/2ML IJ SOLN
25.0000 ug | INTRAMUSCULAR | Status: DC | PRN
  Administered 2016-09-09 (×2): 25 ug via INTRAVENOUS
  Administered 2016-09-09: 50 ug via INTRAVENOUS

## 2016-09-09 MED ORDER — LACTATED RINGERS IV SOLN
INTRAVENOUS | Status: DC | PRN
  Administered 2016-09-09: 07:00:00 via INTRAVENOUS

## 2016-09-09 MED ORDER — PROPOFOL 10 MG/ML IV BOLUS
INTRAVENOUS | Status: AC
  Filled 2016-09-09: qty 20

## 2016-09-09 MED ORDER — MIDAZOLAM HCL 2 MG/2ML IJ SOLN
INTRAMUSCULAR | Status: AC
  Filled 2016-09-09: qty 2

## 2016-09-09 MED ORDER — IOPAMIDOL (ISOVUE-300) INJECTION 61%
INTRAVENOUS | Status: AC
  Filled 2016-09-09: qty 50

## 2016-09-09 MED ORDER — IOPAMIDOL (ISOVUE-300) INJECTION 61%
INTRAVENOUS | Status: DC | PRN
  Administered 2016-09-09: 50 mL via INTRAVENOUS

## 2016-09-09 MED ORDER — MEPERIDINE HCL 50 MG/ML IJ SOLN: 6.2500 mg | INTRAMUSCULAR | Status: DC | PRN

## 2016-09-09 MED ORDER — ACETAMINOPHEN 650 MG RE SUPP
650.0000 mg | RECTAL | Status: DC | PRN
  Filled 2016-09-09: qty 1

## 2016-09-09 MED ORDER — ACETAMINOPHEN 500 MG PO TABS
1000.0000 mg | ORAL_TABLET | ORAL | Status: AC
  Administered 2016-09-09: 1000 mg via ORAL
  Filled 2016-09-09 (×2): qty 2

## 2016-09-09 MED ORDER — BUPIVACAINE HCL (PF) 0.25 % IJ SOLN
INTRAMUSCULAR | Status: DC | PRN
  Administered 2016-09-09: 40 mL

## 2016-09-09 MED ORDER — HYDROMORPHONE HCL 1 MG/ML IJ SOLN
0.2500 mg | INTRAMUSCULAR | Status: DC | PRN
  Administered 2016-09-09 (×4): 0.5 mg via INTRAVENOUS

## 2016-09-09 MED ORDER — ROCURONIUM BROMIDE 100 MG/10ML IV SOLN
INTRAVENOUS | Status: DC | PRN
  Administered 2016-09-09: 50 mg via INTRAVENOUS

## 2016-09-09 MED ORDER — CELECOXIB 200 MG PO CAPS
400.0000 mg | ORAL_CAPSULE | ORAL | Status: AC
  Administered 2016-09-09: 400 mg via ORAL
  Filled 2016-09-09: qty 2

## 2016-09-09 MED ORDER — LACTATED RINGERS IR SOLN
Status: DC | PRN
  Administered 2016-09-09: 1000 mL

## 2016-09-09 MED ORDER — SODIUM CHLORIDE 0.9% FLUSH: 3.0000 mL | INTRAVENOUS | Status: DC | PRN

## 2016-09-09 MED ORDER — BUPIVACAINE HCL (PF) 0.25 % IJ SOLN
INTRAMUSCULAR | Status: AC
  Filled 2016-09-09: qty 60

## 2016-09-09 MED ORDER — LACTATED RINGERS IV SOLN: INTRAVENOUS | Status: DC

## 2016-09-09 MED ORDER — GABAPENTIN 300 MG PO CAPS
300.0000 mg | ORAL_CAPSULE | ORAL | Status: AC
  Administered 2016-09-09: 300 mg via ORAL
  Filled 2016-09-09: qty 1

## 2016-09-09 MED ORDER — HYDRALAZINE HCL 20 MG/ML IJ SOLN
5.0000 mg | Freq: Once | INTRAMUSCULAR | Status: AC
  Administered 2016-09-09: 5 mg via INTRAVENOUS

## 2016-09-09 MED ORDER — ONDANSETRON HCL 4 MG/2ML IJ SOLN
INTRAMUSCULAR | Status: AC
  Filled 2016-09-09: qty 2

## 2016-09-09 MED ORDER — HYDROMORPHONE HCL 1 MG/ML IJ SOLN
INTRAMUSCULAR | Status: AC
  Filled 2016-09-09: qty 1

## 2016-09-09 MED ORDER — SUGAMMADEX SODIUM 200 MG/2ML IV SOLN
INTRAVENOUS | Status: AC
  Filled 2016-09-09: qty 2

## 2016-09-09 MED ORDER — SODIUM CHLORIDE 0.9 % IV SOLN: 250.0000 mL | INTRAVENOUS | Status: DC | PRN

## 2016-09-09 MED ORDER — STERILE WATER FOR IRRIGATION IR SOLN
Status: DC | PRN
  Administered 2016-09-09: 1000 mL

## 2016-09-09 MED ORDER — METOCLOPRAMIDE HCL 5 MG/ML IJ SOLN
10.0000 mg | Freq: Once | INTRAMUSCULAR | Status: AC | PRN
  Administered 2016-09-09: 10 mg via INTRAVENOUS
  Filled 2016-09-09: qty 2

## 2016-09-09 MED ORDER — EPHEDRINE SULFATE 50 MG/ML IJ SOLN
INTRAMUSCULAR | Status: DC | PRN
  Administered 2016-09-09 (×2): 5 mg via INTRAVENOUS

## 2016-09-09 MED ORDER — DEXAMETHASONE SODIUM PHOSPHATE 10 MG/ML IJ SOLN
INTRAMUSCULAR | Status: DC | PRN
  Administered 2016-09-09: 10 mg via INTRAVENOUS

## 2016-09-09 MED ORDER — HYDROMORPHONE HCL 2 MG PO TABS: 2.0000 mg | ORAL_TABLET | Freq: Four times a day (QID) | ORAL | 0 refills | Status: DC | PRN

## 2016-09-09 MED ORDER — SUGAMMADEX SODIUM 200 MG/2ML IV SOLN
INTRAVENOUS | Status: DC | PRN
  Administered 2016-09-09: 150 mg via INTRAVENOUS

## 2016-09-09 MED ORDER — 0.9 % SODIUM CHLORIDE (POUR BTL) OPTIME
TOPICAL | Status: DC | PRN
  Administered 2016-09-09: 1000 mL

## 2016-09-09 MED ORDER — DEXAMETHASONE SODIUM PHOSPHATE 10 MG/ML IJ SOLN
INTRAMUSCULAR | Status: AC
  Filled 2016-09-09: qty 1

## 2016-09-09 MED ORDER — CLINDAMYCIN PHOSPHATE 900 MG/50ML IV SOLN
INTRAVENOUS | Status: AC
  Filled 2016-09-09: qty 50

## 2016-09-09 MED ORDER — PROPOFOL 10 MG/ML IV BOLUS
INTRAVENOUS | Status: DC | PRN
  Administered 2016-09-09: 120 mg via INTRAVENOUS

## 2016-09-09 MED ORDER — HYDRALAZINE HCL 20 MG/ML IJ SOLN
INTRAMUSCULAR | Status: AC
  Filled 2016-09-09: qty 1

## 2016-09-09 MED ORDER — FENTANYL CITRATE (PF) 250 MCG/5ML IJ SOLN
INTRAMUSCULAR | Status: AC
  Filled 2016-09-09: qty 5

## 2016-09-09 MED ORDER — SCOPOLAMINE 1 MG/3DAYS TD PT72
1.0000 | MEDICATED_PATCH | TRANSDERMAL | Status: DC
  Administered 2016-09-09: 1.5 mg via TRANSDERMAL
  Filled 2016-09-09: qty 1

## 2016-09-09 MED ORDER — SODIUM CHLORIDE 0.9% FLUSH: 3.0000 mL | Freq: Two times a day (BID) | INTRAVENOUS | Status: DC

## 2016-09-09 MED ORDER — ACETAMINOPHEN 325 MG PO TABS: 650.0000 mg | ORAL_TABLET | ORAL | Status: DC | PRN

## 2016-09-09 MED ORDER — FENTANYL CITRATE (PF) 100 MCG/2ML IJ SOLN
INTRAMUSCULAR | Status: DC | PRN
  Administered 2016-09-09 (×3): 50 ug via INTRAVENOUS
  Administered 2016-09-09: 100 ug via INTRAVENOUS

## 2016-09-09 MED ORDER — MIDAZOLAM HCL 5 MG/5ML IJ SOLN
INTRAMUSCULAR | Status: DC | PRN
  Administered 2016-09-09: 2 mg via INTRAVENOUS

## 2016-09-09 MED ORDER — FENTANYL CITRATE (PF) 100 MCG/2ML IJ SOLN
INTRAMUSCULAR | Status: AC
  Filled 2016-09-09: qty 2

## 2016-09-09 MED ORDER — ONDANSETRON HCL 4 MG/2ML IJ SOLN
INTRAMUSCULAR | Status: DC | PRN
  Administered 2016-09-09: 4 mg via INTRAVENOUS

## 2016-09-09 MED ORDER — LIDOCAINE HCL (CARDIAC) 20 MG/ML IV SOLN
INTRAVENOUS | Status: DC | PRN
  Administered 2016-09-09: 60 mg via INTRAVENOUS

## 2016-09-09 SURGICAL SUPPLY — 44 items
APPLIER CLIP 5 13 M/L LIGAMAX5 (MISCELLANEOUS) ×2
APR CLP MED LRG 5 ANG JAW (MISCELLANEOUS) ×1
BAG SPEC RTRVL LRG 6X4 10 (ENDOMECHANICALS)
CABLE HIGH FREQUENCY MONO STRZ (ELECTRODE) ×2 IMPLANT
CHLORAPREP W/TINT 26ML (MISCELLANEOUS) ×2 IMPLANT
CLIP APPLIE 5 13 M/L LIGAMAX5 (MISCELLANEOUS) ×1 IMPLANT
COVER MAYO STAND STRL (DRAPES) ×2 IMPLANT
COVER SURGICAL LIGHT HANDLE (MISCELLANEOUS) ×2 IMPLANT
DECANTER SPIKE VIAL GLASS SM (MISCELLANEOUS) ×1 IMPLANT
DEVICE TROCAR PUNCTURE CLOSURE (ENDOMECHANICALS) ×1 IMPLANT
DRAIN CHANNEL 19F RND (DRAIN) IMPLANT
DRAPE C-ARM 42X120 X-RAY (DRAPES) ×2 IMPLANT
DRAPE WARM FLUID 44X44 (DRAPE) ×2 IMPLANT
DRSG TEGADERM 2-3/8X2-3/4 SM (GAUZE/BANDAGES/DRESSINGS) ×3 IMPLANT
DRSG TEGADERM 4X4.75 (GAUZE/BANDAGES/DRESSINGS) ×2 IMPLANT
ELECT REM PT RETURN 15FT ADLT (MISCELLANEOUS) ×2 IMPLANT
ENDOLOOP SUT PDS II  0 18 (SUTURE)
ENDOLOOP SUT PDS II 0 18 (SUTURE) IMPLANT
EVACUATOR SILICONE 100CC (DRAIN) IMPLANT
GAUZE SPONGE 2X2 8PLY STRL LF (GAUZE/BANDAGES/DRESSINGS) ×1 IMPLANT
GLOVE ECLIPSE 8.0 STRL XLNG CF (GLOVE) ×2 IMPLANT
GLOVE INDICATOR 8.0 STRL GRN (GLOVE) ×2 IMPLANT
GOWN STRL REUS W/TWL XL LVL3 (GOWN DISPOSABLE) ×5 IMPLANT
IRRIG SUCT STRYKERFLOW 2 WTIP (MISCELLANEOUS) ×2
IRRIGATION SUCT STRKRFLW 2 WTP (MISCELLANEOUS) ×1 IMPLANT
KIT BASIN OR (CUSTOM PROCEDURE TRAY) ×2 IMPLANT
PAD POSITIONING PINK XL (MISCELLANEOUS) ×2 IMPLANT
POSITIONER SURGICAL ARM (MISCELLANEOUS) IMPLANT
POUCH SPECIMEN RETRIEVAL 10MM (ENDOMECHANICALS) IMPLANT
SCISSORS LAP 5X35 DISP (ENDOMECHANICALS) ×2 IMPLANT
SET CHOLANGIOGRAPH MIX (MISCELLANEOUS) ×2 IMPLANT
SHEARS HARMONIC ACE PLUS 36CM (ENDOMECHANICALS) ×1 IMPLANT
SLEEVE XCEL OPT CAN 5 100 (ENDOMECHANICALS) ×2 IMPLANT
SPONGE GAUZE 2X2 STER 10/PKG (GAUZE/BANDAGES/DRESSINGS) ×1
SUT MNCRL AB 4-0 PS2 18 (SUTURE) ×2 IMPLANT
SUT PDS AB 1 CT1 27 (SUTURE) ×3 IMPLANT
SYR 20CC LL (SYRINGE) ×2 IMPLANT
TOWEL OR 17X26 10 PK STRL BLUE (TOWEL DISPOSABLE) ×2 IMPLANT
TOWEL OR NON WOVEN STRL DISP B (DISPOSABLE) ×2 IMPLANT
TRAY LAPAROSCOPIC (CUSTOM PROCEDURE TRAY) ×2 IMPLANT
TROCAR BLADELESS OPT 5 100 (ENDOMECHANICALS) ×2 IMPLANT
TROCAR BLADELESS OPT 5 150 (ENDOMECHANICALS) ×1 IMPLANT
TROCAR XCEL NON-BLD 11X100MML (ENDOMECHANICALS) ×1 IMPLANT
TUBING INSUF HEATED (TUBING) ×2 IMPLANT

## 2016-09-09 NOTE — Discharge Instructions (Signed)

## 2016-09-09 NOTE — Progress Notes (Addendum)
Spoke with Dr. Acey Lavarignan regarding patient's nausea/vomiting. OK to give Reglan. Informed VSS. OK to d/c home when nausea improves. Ms. Maria Oliver's son states he will be patient this evening.

## 2016-09-09 NOTE — Op Note (Signed)
09/09/2016  PATIENT:  Maria Oliver  56 y.o. 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  INDICATION: 56 year old smoking female with giant incisional hernia.  She continues to smoke.  The hernia is reducible and not causing obstruction or specific pain.  However she has had episodes of upper abdominal pain.  Workup suspicious for chronic cholecystitis.  Offered cholecystectomy.  She agrees to proceed.  The anatomy & physiology of hepatobiliary & pancreatic function was discussed.  The pathophysiology of gallbladder dysfunction was discussed.  Natural history risks without surgery was discussed.   I feel the risks of no intervention will lead to serious problems that outweigh the operative risks; therefore, I recommended cholecystectomy to remove the pathology.  I explained laparoscopic techniques with possible need for an open approach.  Probable  cholangiogram to evaluate the bilary tract was explained as well.    Risks such as bleeding, infection, abscess, leak, injury to other organs, need for further treatment, heart attack, death, and other risks were discussed.  I noted a good likelihood this will help address the problem.  Possibility that this will not correct all abdominal symptoms was explained.  Goals of post-operative recovery were discussed as well.  We will work to minimize complications.  An educational handout further explaining the pathology and treatment options was given as well.  Questions were answered.  The patient expresses understanding & wishes to proceed with surgery.  OR FINDINGS: Thinned out stretched bulky gallbladder with moderate adhesions consistent with end-stage chronic cholecystitis.  Cholangiogram shows classic normal biliary anatomy without any obstruction.  Liver looks normal.  Adhesions to the dome of the liver suspicious for Fitz-Hugh Curtis syndrome.  Lysed.  Giant infraumbilical incisional hernia primarily covered with omentum.  Spontaneously reduced.  Left in place  DESCRIPTION:   The patient was identified & brought into the operating room. The patient was positioned supine with arms tucked. SCDs were active during the entire case. The patient underwent general anesthesia without any difficulty.  The abdomen was prepped and draped in a sterile fashion. A Surgical Timeout confirmed our plan.  We positioned the patient in reverse [place] & right side up.  I placed a 5mm laparoscopic port through the abdominal wall using optical entry technique in the supraumbilical space.  Entry was clean but moderate omental adhesions nearby noted..  We induced carbon dioxide insufflation. Camera inspection revealed no injury. There were no adhesions to the anterior abdominal wall supraumbilically.  I proceeded to continue with laparoscopic technique. I placed a #5 port in supraumbilical region, another  5mm port  in the right flank near the anterior axillary line, and a 10mm port in the left subxiphoid region obliquely within the falciform ligament.  I redirected the supraumbilical port to be superior to the omental adhesions near the large infraumbilical incisional hernia   I turned attention to the right upper quadrant.  Gallbladder was quite stretched out and boggy with moderate adhesions.  The gallbladder was stretched out and enlarged and boggy with moderate adhesions to greater omentum, mesocolon, duodenal bulb.   The gallbladder fundus was elevated cephalad. I used hook cautery to free the peritoneal coverings between the gallbladder and the liver on the posteriolateral and anteriomedial walls.   I used careful blunt and hook dissection to help get a good critical view of the cystic artery and cystic duct. I did further dissection to free a few centimeters of the  gallbladder off the liver bed to get a good critical view of the infundibulum and cystic duct. I mobilized the cystic artery; and, after getting a good 360 view, ligated the cystic artery using clips. I skeletonized the cystic duct.  I placed a clip on the infundibulum. I did a partial cystic duct-otomy and ensured patency. I placed a 5 [place] cholangiocatheter through a puncture site at the right subcostal ridge of the abdominal wall and directed it into the cystic duct.  We ran a cholangiogram with dilute radio-opaque contrast and continuous fluoroscopy.  Contrast flowed from a side branch consistent with cystic duct cannulization. Contrast flowed up the common hepatic duct into the right and left intrahepatic chains out to secondary radicals. Contrast flowed down the common bile duct easily across the normal ampulla into the duodenum.  This was consistent with a normal cholangiogram.  I removed the cholangiocatheter. I placed clips on the cystic duct x4.  I completed cystic duct transection. I freed the gallbladder from its remaining attachments to  the liver. I ensured hemostasis on the gallbladder fossa of the liver and elsewhere. I inspected the rest of the abdomen & detected no injury nor bleeding elsewhere.  Patient still had moderate adhesions of the dome of the liver to the diaphragm consistent with Hughie ClossFitzhugh Curtis syndrome.  These were carefully freed off was focused cautery.  Process are fatty change of the liver.  Inspection around the giant infraumbilical incisional hernia revealed omentum only without a bowel.  No evidence of injury or abnormality.  Hemostasis was good.  I removed the gallbladder out the subxiphoid port site inside an Endo Catch bag I closed the subxiphoid fascia transversely using #1 PDS suture using laparoscopic suture passer.  I closed the supraumbilical small fascial defect with 0 Vicryl suture. I closed the skin using 4-0 monocryl stitch.  Sterile dressings were applied. The patient was extubated & arrived in the PACU in stable condition..  I had discussed postoperative care with the patient in the holding area.  I discussed operative findings, updated the patient's status, discussed probable steps to recovery, and gave postoperative recommendations to the patient's family.  Recommendations were made.  Questions were answered.  He expressed understanding & appreciation.   Ardeth SportsmanSteven C. Leasa Kincannon, M.D., F.A.C.S. Gastrointestinal and Minimally Invasive Surgery [facility] [address] [place], [place] [id number] [phone] Main / Paging  09/09/2016 9:14 AM

## 2016-09-09 NOTE — Anesthesia Preprocedure Evaluation (Addendum)
Anesthesia Evaluation  Patient identified by MRN, date of birth, ID band Patient awake    Reviewed: Allergy & Precautions, NPO status , Patient's Chart, lab work & pertinent test results  History of Anesthesia Complications (+) PROLONGED EMERGENCE and history of anesthetic complications  Airway Mallampati: II  TM Distance: >3 FB Neck ROM: Full    Dental no notable dental hx. (+) Partial Upper   Pulmonary asthma , COPD, Current Smoker,    Pulmonary exam normal breath sounds clear to auscultation       Cardiovascular hypertension, Pt. on medications Normal cardiovascular exam Rhythm:Regular Rate:Normal     Neuro/Psych negative neurological ROS  negative psych ROS   GI/Hepatic negative GI ROS, Neg liver ROS,   Endo/Other  negative endocrine ROS  Renal/GU negative Renal ROS  negative genitourinary   Musculoskeletal negative musculoskeletal ROS (+)   Abdominal   Peds negative pediatric ROS (+)  Hematology negative hematology ROS (+)   Anesthesia Other Findings   Reproductive/Obstetrics negative OB ROS                            Anesthesia Physical Anesthesia Plan  ASA: III  Anesthesia Plan: General   Post-op Pain Management:    Induction: Intravenous  Airway Management Planned:   Additional Equipment:   Intra-op Plan:   Post-operative Plan: Extubation in OR  Informed Consent: I have reviewed the patients History and Physical, chart, labs and discussed the procedure including the risks, benefits and alternatives for the proposed anesthesia with the patient or authorized representative who has indicated his/her understanding and acceptance.   Dental advisory given  Plan Discussed with: CRNA  Anesthesia Plan Comments:        Anesthesia Quick Evaluation

## 2016-09-09 NOTE — Progress Notes (Signed)
Spoke with Dr. Acey Lavarignan regarding patient's O2 sats and nausea. Scope patch ordered. Ms. Maria Oliver placed on 2L O2. VSS. Will continue to monitor.

## 2016-09-09 NOTE — Interval H&P Note (Signed)
History and Physical Interval Note:  09/09/2016 7:20 AM  Maria Oliver  has presented today for surgery, with the diagnosis of Chronic cholecystitis  The various methods of treatment have been discussed with the patient and family. After consideration of risks, benefits and other options for treatment, the patient has consented to  Procedure(s): LAPAROSCOPIC CHOLECYSTECTOMY SINGLE SITE WITH INTRAOPERATIVE CHOLANGIOGRAM (N/A) as a surgical intervention .  The patient's history has been reviewed, patient examined, no change in status, stable for surgery.  I have reviewed the patient's chart and labs.  Questions were answered to the patient's satisfaction.     Yitzel Shasteen C.
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
## 2016-09-09 NOTE — Anesthesia Procedure Notes (Signed)
Procedure Name: Intubation Date/Time: 09/09/2016 7:37 AM Performed by: Thornell MuleSTUBBLEFIELD, Ileanna Gemmill G Pre-anesthesia Checklist: Patient identified, Emergency Drugs available, Suction available and Patient being monitored Patient Re-evaluated:Patient Re-evaluated prior to inductionOxygen Delivery Method: Circle system utilized Preoxygenation: Pre-oxygenation with 100% oxygen Intubation Type: IV induction Ventilation: Mask ventilation without difficulty Laryngoscope Size: Miller and 3 Grade View: Grade I Tube type: Oral Tube size: 7.0 mm Number of attempts: 1 Airway Equipment and Method: Stylet and Oral airway Placement Confirmation: ETT inserted through vocal cords under direct vision,  positive ETCO2 and breath sounds checked- equal and bilateral Secured at: 19 cm Tube secured with: Tape Dental Injury: Teeth and Oropharynx as per pre-operative assessment

## 2016-09-09 NOTE — Anesthesia Postprocedure Evaluation (Signed)
Anesthesia Post Note  Patient: Madie Renollen D Casselman  Procedure(s) Performed: Procedure(s) (LRB): LAPAROSCOPIC CHOLECYSTECTOMY  WITH INTRAOPERATIVE CHOLANGIOGRAM (N/A)  Patient location during evaluation: PACU Anesthesia Type: General Level of consciousness: awake and alert Pain management: pain level controlled Vital Signs Assessment: post-procedure vital signs reviewed and stable Respiratory status: spontaneous breathing, nonlabored ventilation, respiratory function stable and patient connected to nasal cannula oxygen Cardiovascular status: blood pressure returned to baseline and stable Postop Assessment: no signs of nausea or vomiting Anesthetic complications: no       Last Vitals:  Vitals:   09/09/16 1020 09/09/16 1036  BP: 125/65 128/61  Pulse: 80 74  Resp: (!) 25 18  Temp: 36.3 C 36.4 C    Last Pain:  Vitals:   09/09/16 1113  TempSrc:   PainSc: 5                  Phillips Groutarignan, Maddex Garlitz

## 2016-09-09 NOTE — Transfer of Care (Signed)
Immediate Anesthesia Transfer of Care Note  Patient: Maria Oliver  Procedure(s) Performed: Procedure(s): LAPAROSCOPIC CHOLECYSTECTOMY  WITH INTRAOPERATIVE CHOLANGIOGRAM (N/A)  Patient Location: PACU  Anesthesia Type:General  Level of Consciousness: awake, alert  and oriented  Airway & Oxygen Therapy: Patient Spontanous Breathing and Patient connected to face mask oxygen  Post-op Assessment: Report given to RN and Post -op Vital signs reviewed and stable  Post vital signs: Reviewed and stable  Last Vitals:  Vitals:   09/09/16 0658  BP: 125/89  Pulse: 77  Resp: 18  Temp: 36.4 C    Last Pain:  Vitals:   09/09/16 0658  TempSrc: Oral  PainSc:       Patients Stated Pain Goal: 4 (09/09/16 0616)  Complications: No apparent anesthesia complications

## 2016-09-10 ENCOUNTER — Encounter (HOSPITAL_COMMUNITY): Payer: Self-pay | Admitting: Surgery

## 2016-09-16 ENCOUNTER — Encounter: Payer: Self-pay | Admitting: Family Medicine

## 2016-09-16 ENCOUNTER — Ambulatory Visit: Payer: Medicare Other | Attending: Family Medicine | Admitting: Family Medicine

## 2016-09-16 VITALS — BP 117/75 | HR 68 | Temp 97.3°F | Ht 64.0 in | Wt 143.6 lb

## 2016-09-16 DIAGNOSIS — M542 Cervicalgia: Secondary | ICD-10-CM | POA: Diagnosis not present

## 2016-09-16 DIAGNOSIS — F17210 Nicotine dependence, cigarettes, uncomplicated: Secondary | ICD-10-CM | POA: Insufficient documentation

## 2016-09-16 DIAGNOSIS — J0111 Acute recurrent frontal sinusitis: Secondary | ICD-10-CM | POA: Insufficient documentation

## 2016-09-16 DIAGNOSIS — Z79899 Other long term (current) drug therapy: Secondary | ICD-10-CM | POA: Diagnosis not present

## 2016-09-16 MED ORDER — DOXYCYCLINE HYCLATE 100 MG PO TABS: 100.0000 mg | ORAL_TABLET | Freq: Two times a day (BID) | ORAL | 0 refills | Status: DC

## 2016-09-16 MED ORDER — METHYLPREDNISOLONE 4 MG PO TBPK: ORAL_TABLET | ORAL | 0 refills | Status: DC

## 2016-09-16 NOTE — Progress Notes (Signed)
Subjective:  Patient ID: Maria Oliver, female    DOB: 1960/09/25  Age: 56 y.o. MRN: [id number]  CC: Neck Pain   HPI Maria Oliver presents for   1.  Neck pain: x 3 months. Pain in lateral neck on both side. Also with popping in ears and dizziness sensation when she tilts her head back. No fever or chills. No nausea or emesis. She is 1 weeks s/p lap chole. She still has her incision sites covered with gauze and tegaderm.   Social History  Substance Use Topics  . Smoking status: Current Every Day Smoker    Packs/day: 0.50    Years: 40.00    Types: Cigarettes    Last attempt to quit: 03/08/2016  . Smokeless tobacco: Never Used     Comment: Recently quit, started back to 4 cigs/daily  . Alcohol use No    Outpatient Medications Prior to Visit  Medication Sig Dispense Refill  . albuterol (PROVENTIL HFA;VENTOLIN HFA) 108 (90 Base) MCG/ACT inhaler Inhale 2 puffs into the lungs every 4 (four) hours as needed for wheezing or shortness of breath. 1 Inhaler 0  . cholecalciferol (VITAMIN D) 1000 units tablet Take 1,000 Units by mouth every other day.     . hydrochlorothiazide (MICROZIDE) 12.5 MG capsule Take 1 capsule (12.5 mg total) by mouth daily. 30 capsule 5  . HYDROmorphone (DILAUDID) 2 MG tablet Take 1-2 tablets (2-4 mg total) by mouth every 6 (six) hours as needed for severe pain. 30 tablet 0  . hydrOXYzine (ATARAX/VISTARIL) 10 MG tablet Take 10 mg by mouth 3 (three) times daily as needed for anxiety.  2  . ibuprofen (ADVIL,MOTRIN) 600 MG tablet Take 1 tablet (600 mg total) by mouth every 6 (six) hours as needed for moderate pain. 30 tablet 0  . methocarbamol (ROBAXIN) 500 MG tablet Take 2 tablets (1,000 mg total) by mouth every 8 (eight) hours as needed for muscle spasms. 30 tablet 0  . mometasone-formoterol (DULERA) 200-5 MCG/ACT AERO Inhale 2 puffs into the lungs 2 (two) times daily. 1 Inhaler 5  . Multiple Vitamin (MULTIVITAMIN WITH MINERALS) TABS tablet Take 1 tablet by mouth  every other day.    . ondansetron (ZOFRAN ODT) 8 MG disintegrating tablet Take 1 tablet (8 mg total) by mouth every 8 (eight) hours as needed for nausea or vomiting. (Patient not taking: Reported on 09/07/2016) 10 tablet 0  . PARoxetine (PAXIL) 30 MG tablet Take 30 mg by mouth daily.    Marland Kitchen tiotropium (SPIRIVA HANDIHALER) 18 MCG inhalation capsule Place 1 capsule (18 mcg total) into inhaler and inhale daily. 90 capsule 3   No facility-administered medications prior to visit.     ROS Review of Systems  Constitutional: Negative for chills and fever.  HENT: Positive for ear pain.   Eyes: Negative for visual disturbance.  Respiratory: Negative for shortness of breath.   Cardiovascular: Negative for chest pain.  Gastrointestinal: Negative for abdominal pain and blood in stool.  Musculoskeletal: Positive for neck pain. Negative for arthralgias and back pain.  Skin: Negative for rash.  Allergic/Immunologic: Negative for immunocompromised state.  Neurological: Positive for dizziness and headaches.  Hematological: Negative for adenopathy. Does not bruise/bleed easily.  Psychiatric/Behavioral: Negative for dysphoric mood and suicidal ideas.    Objective:  BP 117/75   Pulse 68   Temp 97.3 F (36.3 C) (Oral)   Ht  (1.626 m)   Wt 143 lb 9.6 oz (65.1 kg)   SpO2 98%   BMI  24.65 kg/m   BP/Weight 09/09/2016 09/07/2016 09/02/2016  Systolic BP 135 130 154  Diastolic BP 77 73 89  Wt. (Lbs) 145 145 145  BMI 24.89 24.89 24.89    Physical Exam  Constitutional: She is oriented to person, place, and time. She appears well-developed and well-nourished. No distress.  HENT:  Head: Normocephalic and atraumatic.  Right Ear: Tympanic membrane, external ear and ear canal normal.  Left Ear: Tympanic membrane, external ear and ear canal normal.  Ears:  Nose: Right sinus exhibits frontal sinus tenderness.  Cardiovascular: Normal rate, regular rhythm, normal heart sounds and intact distal pulses.     Pulmonary/Chest: Effort normal and breath sounds normal.  Abdominal: A hernia is present. Hernia confirmed positive in the ventral area.    Musculoskeletal: She exhibits no edema.  Neurological: She is alert and oriented to person, place, and time.  Skin: Skin is warm and dry. No rash noted.  Psychiatric: She has a normal mood and affect.    Assessment & Plan:  Maria Oliver was seen today for neck pain.  Diagnoses and all orders for this visit:  Acute recurrent frontal sinusitis -     methylPREDNISolone (MEDROL DOSEPAK) 4 MG TBPK tablet; Taper per packet insert -     doxycycline (VIBRA-TABS) 100 MG tablet; Take 1 tablet (100 mg total) by mouth 2 (two) times daily.   There are no diagnoses linked to this encounter.  No orders of the defined types were placed in this encounter.   Follow-up: Return in about 4 weeks (around 10/14/2016) for neck pain .   Dessa Phi MD

## 2016-09-16 NOTE — Assessment & Plan Note (Signed)
Persistent neck pain with ear popping, dizziness and sinus pressure Plan: Course of depo medrol Course of doxycycline as patient has allergy to PCN and fluoroquinolones

## 2016-09-16 NOTE — Patient Instructions (Signed)
Meriem was seen today for neck pain.  Diagnoses and all orders for this visit:  Acute recurrent frontal sinusitis -     methylPREDNISolone (MEDROL DOSEPAK) 4 MG TBPK tablet; Taper per packet insert -     doxycycline (VIBRA-TABS) 100 MG tablet; Take 1 tablet (100 mg total) by mouth 2 (two) times daily.   f/u in 4 weeks sooner if needed  Dr. Armen Pickup

## 2016-09-24 ENCOUNTER — Encounter (HOSPITAL_COMMUNITY): Payer: Medicare Other

## 2016-10-14 ENCOUNTER — Ambulatory Visit: Payer: Medicare Other | Attending: Family Medicine | Admitting: Family Medicine

## 2016-10-14 ENCOUNTER — Encounter: Payer: Self-pay | Admitting: Family Medicine

## 2016-10-14 VITALS — BP 111/67 | HR 74 | Temp 97.9°F | Ht 64.0 in | Wt 142.6 lb

## 2016-10-14 DIAGNOSIS — M19012 Primary osteoarthritis, left shoulder: Secondary | ICD-10-CM | POA: Diagnosis not present

## 2016-10-14 DIAGNOSIS — Z79899 Other long term (current) drug therapy: Secondary | ICD-10-CM | POA: Diagnosis not present

## 2016-10-14 DIAGNOSIS — R21 Rash and other nonspecific skin eruption: Secondary | ICD-10-CM | POA: Diagnosis not present

## 2016-10-14 DIAGNOSIS — L282 Other prurigo: Secondary | ICD-10-CM | POA: Diagnosis not present

## 2016-10-14 DIAGNOSIS — M19011 Primary osteoarthritis, right shoulder: Secondary | ICD-10-CM | POA: Diagnosis not present

## 2016-10-14 DIAGNOSIS — F17210 Nicotine dependence, cigarettes, uncomplicated: Secondary | ICD-10-CM | POA: Diagnosis not present

## 2016-10-14 DIAGNOSIS — M542 Cervicalgia: Secondary | ICD-10-CM | POA: Diagnosis not present

## 2016-10-14 MED ORDER — TRIAMCINOLONE ACETONIDE 0.5 % EX OINT: 1.0000 "application " | TOPICAL_OINTMENT | Freq: Two times a day (BID) | CUTANEOUS | 0 refills | Status: DC

## 2016-10-14 MED ORDER — DICLOFENAC SODIUM 1 % TD GEL: 4.0000 g | Freq: Four times a day (QID) | TRANSDERMAL | 2 refills | Status: DC

## 2016-10-14 NOTE — Progress Notes (Signed)
Subjective:  Patient ID: Maria Oliver, female    DOB: 05-25-1961  Age: 56 y.o. MRN: [id number]  CC: Neck Pain   HPI Maria Renollen D Seyfried presents for   1.  Neck pain: x 4 months. She has known mild cervical DJD. She reports her pain has  improved following course of prednisone and doxycyline.  Pain in lateral neck on both side. Pain has improved down to 2/10. She still has some R sided neck pain. She takes ibuprofen 200 mg (3 tabs once daily).   2. FMLA paperwork: her son took off from work at [employer] from 4/12-4/23/18 to help her with post-op care. She is requesting a letter stating she was post-op and needed assistance.   3. Rash: x one month. Pruritic. On back. R upper back and mid back. She lives alone. No close contacts with rash. No new medications in the last month. No rash on abdomen or extremities.   Past Surgical History:  Procedure Laterality Date  . COLON SURGERY  2010   per patient "colon take down"  . COLOSTOMY  2010  . LAPAROSCOPIC CHOLECYSTECTOMY SINGLE SITE WITH INTRAOPERATIVE CHOLANGIOGRAM N/A 09/09/2016   Procedure: LAPAROSCOPIC CHOLECYSTECTOMY  WITH INTRAOPERATIVE CHOLANGIOGRAM;  Surgeon: Karie SodaSteven Gross, MD;  Location: [facility];  Service: General;  Laterality: N/A;  . TUBAL LIGATION      Social History  Substance Use Topics  . Smoking status: Current Every Day Smoker    Packs/day: 0.50    Years: 40.00    Types: Cigarettes    Last attempt to quit: 03/08/2016  . Smokeless tobacco: Never Used     Comment: Recently quit, started back to 4 cigs/daily  . Alcohol use No    Outpatient Medications Prior to Visit  Medication Sig Dispense Refill  . albuterol (PROVENTIL HFA;VENTOLIN HFA) 108 (90 Base) MCG/ACT inhaler Inhale 2 puffs into the lungs every 4 (four) hours as needed for wheezing or shortness of breath. 1 Inhaler 0  . cholecalciferol (VITAMIN D) 1000 units tablet Take 1,000 Units by mouth every other day.     Marland Kitchen. doxycycline (VIBRA-TABS) 100 MG tablet Take 1 tablet (100  mg total) by mouth 2 (two) times daily. 20 tablet 0  . hydrochlorothiazide (MICROZIDE) 12.5 MG capsule Take 1 capsule (12.5 mg total) by mouth daily. 30 capsule 5  . HYDROmorphone (DILAUDID) 2 MG tablet Take 1-2 tablets (2-4 mg total) by mouth every 6 (six) hours as needed for severe pain. 30 tablet 0  . hydrOXYzine (ATARAX/VISTARIL) 10 MG tablet Take 10 mg by mouth 3 (three) times daily as needed for anxiety.  2  . ibuprofen (ADVIL,MOTRIN) 600 MG tablet Take 1 tablet (600 mg total) by mouth every 6 (six) hours as needed for moderate pain. 30 tablet 0  . methocarbamol (ROBAXIN) 500 MG tablet Take 2 tablets (1,000 mg total) by mouth every 8 (eight) hours as needed for muscle spasms. 30 tablet 0  . methylPREDNISolone (MEDROL DOSEPAK) 4 MG TBPK tablet Taper per packet insert 21 tablet 0  . mometasone-formoterol (DULERA) 200-5 MCG/ACT AERO Inhale 2 puffs into the lungs 2 (two) times daily. 1 Inhaler 5  . Multiple Vitamin (MULTIVITAMIN WITH MINERALS) TABS tablet Take 1 tablet by mouth every other day.    . ondansetron (ZOFRAN ODT) 8 MG disintegrating tablet Take 1 tablet (8 mg total) by mouth every 8 (eight) hours as needed for nausea or vomiting. (Patient not taking: Reported on 09/07/2016) 10 tablet 0  . PARoxetine (PAXIL) 30 MG tablet Take  30 mg by mouth daily.    Marland Kitchen tiotropium (SPIRIVA HANDIHALER) 18 MCG inhalation capsule Place 1 capsule (18 mcg total) into inhaler and inhale daily. 90 capsule 3   No facility-administered medications prior to visit.     ROS Review of Systems  Constitutional: Negative for chills and fever.  HENT: Negative for ear pain.   Eyes: Negative for visual disturbance.  Respiratory: Negative for shortness of breath.   Cardiovascular: Negative for chest pain.  Gastrointestinal: Negative for abdominal pain and blood in stool.  Musculoskeletal: Positive for neck pain. Negative for arthralgias and back pain.  Skin: Positive for rash.  Allergic/Immunologic: Negative for  immunocompromised state.  Neurological: Negative for dizziness and headaches.  Hematological: Negative for adenopathy. Does not bruise/bleed easily.  Psychiatric/Behavioral: Negative for dysphoric mood and suicidal ideas.    Objective:  BP 111/67   Pulse 74   Temp 97.9 F (36.6 C) (Oral)   Ht 5\' 4"  (1.626 m)   Wt 142 lb 9.6 oz (64.7 kg)   SpO2 96%   BMI 24.48 kg/m   BP/Weight 10/14/2016 09/16/2016 09/09/2016  Systolic BP 111 117 135  Diastolic BP 67 75 77  Wt. (Lbs) 142.6 143.6 145  BMI 24.48 24.65 24.89    Physical Exam  Constitutional: She is oriented to person, place, and time. She appears well-developed and well-nourished. No distress.  HENT:  Head: Normocephalic and atraumatic.  Nose: Right sinus exhibits frontal sinus tenderness.  Cardiovascular: Normal rate, regular rhythm, normal heart sounds and intact distal pulses.   Pulmonary/Chest: Effort normal and breath sounds normal.  Abdominal: A hernia is present. Hernia confirmed positive in the ventral area.    Musculoskeletal: She exhibits no edema.  Neurological: She is alert and oriented to person, place, and time.  Skin: Skin is warm and dry. No rash noted.     Psychiatric: She has a normal mood and affect.    Assessment & Plan:  Khyli was seen today for neck pain.  Diagnoses and all orders for this visit:  Primary osteoarthritis of both shoulders -     diclofenac sodium (VOLTAREN) 1 % GEL; Apply 4 g topically 4 (four) times daily. To R shoulder and neck  Pruritic rash -     triamcinolone ointment (KENALOG) 0.5 %; Apply 1 application topically 2 (two) times daily. Apply to back rash   There are no diagnoses linked to this encounter.  No orders of the defined types were placed in this encounter.   Follow-up: No Follow-up on file.   Dessa Phi MD

## 2016-10-14 NOTE — Assessment & Plan Note (Signed)
A: OA in multiple areas including neck. Ongoing pain is mild P: Ibuprofen 600 mg daily prn voltaren gel QID

## 2016-10-14 NOTE — Patient Instructions (Signed)
Maria Oliver was seen today for neck pain.  Diagnoses and all orders for this visit:  Primary osteoarthritis of both shoulders -     diclofenac sodium (VOLTAREN) 1 % GEL; Apply 4 g topically 4 (four) times daily. To R shoulder and neck   Please sign a release so I can obtain the records about your wrist surgery   Dr. Armen PickupFunches

## 2016-10-14 NOTE — Assessment & Plan Note (Signed)
Pruritic papular rash Kenalog ointment prescribed

## 2016-10-27 ENCOUNTER — Encounter: Payer: Self-pay | Admitting: Family Medicine

## 2016-11-19 ENCOUNTER — Ambulatory Visit: Payer: Medicare Other | Attending: Family Medicine | Admitting: Physician Assistant

## 2016-11-19 ENCOUNTER — Encounter: Payer: Self-pay | Admitting: Physician Assistant

## 2016-11-19 VITALS — BP 128/78 | HR 69 | Temp 98.1°F | Resp 18 | Ht 64.0 in | Wt 143.0 lb

## 2016-11-19 DIAGNOSIS — E739 Lactose intolerance, unspecified: Secondary | ICD-10-CM | POA: Insufficient documentation

## 2016-11-19 DIAGNOSIS — I10 Essential (primary) hypertension: Secondary | ICD-10-CM | POA: Diagnosis not present

## 2016-11-19 DIAGNOSIS — M5412 Radiculopathy, cervical region: Secondary | ICD-10-CM | POA: Diagnosis not present

## 2016-11-19 DIAGNOSIS — Z885 Allergy status to narcotic agent status: Secondary | ICD-10-CM | POA: Diagnosis not present

## 2016-11-19 DIAGNOSIS — E559 Vitamin D deficiency, unspecified: Secondary | ICD-10-CM

## 2016-11-19 DIAGNOSIS — G8918 Other acute postprocedural pain: Secondary | ICD-10-CM | POA: Diagnosis not present

## 2016-11-19 DIAGNOSIS — Z886 Allergy status to analgesic agent status: Secondary | ICD-10-CM | POA: Diagnosis not present

## 2016-11-19 DIAGNOSIS — Z888 Allergy status to other drugs, medicaments and biological substances status: Secondary | ICD-10-CM | POA: Diagnosis not present

## 2016-11-19 DIAGNOSIS — R52 Pain, unspecified: Secondary | ICD-10-CM | POA: Diagnosis not present

## 2016-11-19 DIAGNOSIS — J449 Chronic obstructive pulmonary disease, unspecified: Secondary | ICD-10-CM | POA: Diagnosis not present

## 2016-11-19 DIAGNOSIS — R1084 Generalized abdominal pain: Secondary | ICD-10-CM | POA: Diagnosis not present

## 2016-11-19 DIAGNOSIS — R51 Headache: Secondary | ICD-10-CM | POA: Diagnosis not present

## 2016-11-19 DIAGNOSIS — R5383 Other fatigue: Secondary | ICD-10-CM | POA: Insufficient documentation

## 2016-11-19 DIAGNOSIS — F419 Anxiety disorder, unspecified: Secondary | ICD-10-CM | POA: Diagnosis not present

## 2016-11-19 DIAGNOSIS — M50122 Cervical disc disorder at C5-C6 level with radiculopathy: Secondary | ICD-10-CM | POA: Diagnosis not present

## 2016-11-19 DIAGNOSIS — R350 Frequency of micturition: Secondary | ICD-10-CM | POA: Diagnosis not present

## 2016-11-19 DIAGNOSIS — Z91012 Allergy to eggs: Secondary | ICD-10-CM | POA: Diagnosis not present

## 2016-11-19 DIAGNOSIS — S52531D Colles' fracture of right radius, subsequent encounter for closed fracture with routine healing: Secondary | ICD-10-CM | POA: Diagnosis not present

## 2016-11-19 DIAGNOSIS — Z881 Allergy status to other antibiotic agents status: Secondary | ICD-10-CM | POA: Diagnosis not present

## 2016-11-19 DIAGNOSIS — M7989 Other specified soft tissue disorders: Secondary | ICD-10-CM | POA: Insufficient documentation

## 2016-11-19 DIAGNOSIS — Z880 Allergy status to penicillin: Secondary | ICD-10-CM | POA: Diagnosis not present

## 2016-11-19 LAB — POCT URINALYSIS DIPSTICK
Bilirubin, UA: NEGATIVE
Blood, UA: NEGATIVE
Glucose, UA: NEGATIVE
Ketones, UA: NEGATIVE
LEUKOCYTES UA: NEGATIVE
Nitrite, UA: NEGATIVE
PH UA: 6 (ref 5.0–8.0)
PROTEIN UA: NEGATIVE
SPEC GRAV UA: 1.02 (ref 1.010–1.025)
UROBILINOGEN UA: 0.2 U/dL

## 2016-11-19 NOTE — Progress Notes (Signed)
Patient ID: Maria Oliver, female   DOB: 10-09-1960, 56 y.o.   MRN: [id number]    [name], is a 56 y.o. female  [id number]  [id number]  DOB - Aug 11, 1960  Subjective:  Chief Complaint and HPI: Maria Oliver is a 56 y.o. female here today for all over body aches and pains. C/o muscle cramps. She is also having abdominal pain and frequent urination for about 2 weeks.  No f/c.  No N/V/D.  She is also having extreme fatigue.  She doesn't feel like she has felt like herself in a few months.  She is still smoking.  She does not drink alcohol. She is scheduled to have an MRI of her neck 11/21/2016.  ROS:   Constitutional:  No f/c, No night sweats, No unexplained weight loss. EENT:  No vision changes, No blurry vision, No hearing changes. No mouth, throat, or ear problems.  Respiratory: No cough, No SOB Cardiac: No CP, no palpitations GI:  No abd pain, No N/V/D. GU: No Urinary s/sx Musculoskeletal: all over body aches. Neuro: No headache, no dizziness, no motor weakness.  Skin: No rash Endocrine:  No polydipsia. No polyuria.  Psych: Denies SI/HI  No problems updated.  ALLERGIES: Allergies  Allergen Reactions  . Aspirin Other (See Comments)    REACTION: GI upset/Hernia   . Ciprofloxacin Other (See Comments)    Tendonitis, joint pain, and nausea.  "Interfered with her HCTZ" (also)  . Codeine Nausea And Vomiting and Other (See Comments)    (Also) GI upset/Hernia (CAN TOLERATE DILAUDID & MORPHINE)  . Eggs Or Egg-Derived Products Nausea Only    Stomach pains  . Fentanyl Nausea Only    Tolerates Dilaudid or morphine much better  . Hydrocodone Nausea And Vomiting  . Lactose Intolerance (Gi) Nausea And Vomiting and Other (See Comments)    (Also) vertigo  . Oxycodone Nausea And Vomiting    Tolerates hydromorphone better  . Penicillins Rash    Has patient had a PCN reaction causing immediate rash, facial/tongue/throat swelling, SOB or lightheadedness with hypotension:  Yes Has patient had a PCN reaction causing severe rash involving mucus membranes or skin necrosis: No Has patient had a PCN reaction that required hospitalization No Has patient had a PCN reaction occurring within the last 10 years: No If all of the above answers are "NO", then may proceed with Cephalosporin use.     PAST MEDICAL HISTORY: Past Medical History:  Diagnosis Date  . Abdominal pain   . Anxiety   . Arthritis   . Asthma   . Complication of anesthesia    slow to arouse post operatively   . COPD (chronic obstructive pulmonary disease) (HCC)   . Generalized headaches   . Hernia, inguinal, right   . Hypertension   . Leg swelling     MEDICATIONS AT HOME: Prior to Admission medications   Medication Sig Start Date End Date Taking? Authorizing Provider  albuterol (PROVENTIL HFA;VENTOLIN HFA) 108 (90 Base) MCG/ACT inhaler Inhale 2 puffs into the lungs every 4 (four) hours as needed for wheezing or shortness of breath. 03/19/16  Yes Mesner, Barbara Cower, MD  cholecalciferol (VITAMIN D) 1000 units tablet Take 1,000 Units by mouth every other day.    Yes [provider]  diclofenac sodium (VOLTAREN) 1 % GEL Apply 4 g topically 4 (four) times daily. To R shoulder and neck 10/14/16  Yes Funches, Josalyn, MD  hydrOXYzine (ATARAX/VISTARIL) 10 MG tablet Take 10 mg by mouth 3 (three) times daily as needed  for anxiety. 07/21/16  Yes [provider]  ibuprofen (ADVIL,MOTRIN) 600 MG tablet Take 1 tablet (600 mg total) by mouth every 6 (six) hours as needed for moderate pain. 09/02/16  Yes Loren Racer, MD  methocarbamol (ROBAXIN) 500 MG tablet Take 2 tablets (1,000 mg total) by mouth every 8 (eight) hours as needed for muscle spasms. 09/02/16  Yes Loren Racer, MD  mometasone-formoterol [facility]) 200-5 MCG/ACT AERO Inhale 2 puffs into the lungs 2 (two) times daily. 04/27/16  Yes Lupita Leash, MD  Multiple Vitamin (MULTIVITAMIN WITH MINERALS) TABS tablet Take 1 tablet by mouth  every other day.   Yes [provider]  ondansetron (ZOFRAN ODT) 8 MG disintegrating tablet Take 1 tablet (8 mg total) by mouth every 8 (eight) hours as needed for nausea or vomiting. 08/05/16  Yes Azalia Bilis, MD  PARoxetine (PAXIL) 30 MG tablet Take 30 mg by mouth daily. 07/21/16  Yes [provider]  tiotropium (SPIRIVA HANDIHALER) 18 MCG inhalation capsule Place 1 capsule (18 mcg total) into inhaler and inhale daily. 04/22/16  Yes Funches, Josalyn, MD  triamcinolone ointment (KENALOG) 0.5 % Apply 1 application topically 2 (two) times daily. Apply to back rash 10/14/16  Yes Funches, Josalyn, MD  hydrochlorothiazide (MICROZIDE) 12.5 MG capsule Take 1 capsule (12.5 mg total) by mouth daily. Patient not taking: Reported on 11/19/2016 06/21/16   Dessa Phi, MD  HYDROmorphone (DILAUDID) 2 MG tablet Take 1-2 tablets (2-4 mg total) by mouth every 6 (six) hours as needed for severe pain. Patient not taking: Reported on 10/14/2016 09/09/16   Karie Soda, MD     Objective:  EXAM:   Vitals:   11/19/16 1134  BP: 128/78  Pulse: 69  Resp: 18  Temp: 98.1 F (36.7 C)  TempSrc: Oral  SpO2: 98%  Weight: 143 lb (64.9 kg)  Height: 5\' 4"  (1.626 m)    General appearance : A&OX3. NAD. Non-toxic-appearing HEENT: Atraumatic and Normocephalic.  PERRLA. EOM intact.   Neck: supple, no JVD. No cervical lymphadenopathy. No thyromegaly Chest/Lungs:  Breathing-non-labored, Good air entry bilaterally, breath sounds normal without rales, rhonchi, or wheezing  CVS: S1 S2 regular, no murmurs, gallops, rubs  Abdomen: Bowel sounds present, Non tender and not distended with no gaurding, rigidity or rebound. Extremities: Bilateral Lower Ext shows no edema, both legs are warm to touch with = pulse throughout Neurology:  CN II-XII grossly intact, Non focal.   Psych:  TP linear. J/I WNL. Normal speech. Appropriate eye contact and affect.  Skin:  No Rash  Data Review Lab Results  Component Value Date     HGBA1C 5.6 08/11/2015     Assessment & Plan   1. Essential hypertension Controlled.  Continue current medication - Basic metabolic panel  2. Body aches - Vitamin D, 25-hydroxy - Magnesium - Sedimentation rate  3. Generalized abdominal pain and frequent urination Non-acute abdomen.  Normal UA dip. - Basic metabolic panel Abdominal pain warnings  4. Fatigue, unspecified type - TSH - CBC With Differential  5. Vitamin D deficiency - Vitamin D, 25-hydroxy  Patient have been counseled extensively about nutrition and exercise  Return in about 6 weeks (around 12/31/2016) for assign new PCP; f/up htn/bodyaches.  The patient was given clear instructions to go to ER or return to medical center if symptoms don't improve, worsen or new problems develop. The patient verbalized understanding. The patient was told to call to get lab results if they haven't heard anything in the next week.  Georgian CoAngela McClung, PA-C [facility] [place], [place] [phone]   11/19/2016, 11:48 AM

## 2016-11-19 NOTE — Progress Notes (Signed)
Patient is here for Fatigue  Patient complains of abdomen pain. Patient complains of feeling fatigue.  Patient has not taken BP medication in "awhile"

## 2016-11-20 LAB — BASIC METABOLIC PANEL
BUN/Creatinine Ratio: 9 (ref 9–23)
BUN: 7 mg/dL (ref 6–24)
CALCIUM: 9.5 mg/dL (ref 8.7–10.2)
CO2: 25 mmol/L (ref 18–29)
CREATININE: 0.76 mg/dL (ref 0.57–1.00)
Chloride: 104 mmol/L (ref 96–106)
GFR calc Af Amer: 100 mL/min/{1.73_m2} (ref >59)
GFR, EST NON AFRICAN AMERICAN: 87 mL/min/{1.73_m2} (ref >59)
Glucose: 80 mg/dL (ref 65–99)
Potassium: 4.1 mmol/L (ref 3.5–5.2)
Sodium: 144 mmol/L (ref 134–144)

## 2016-11-20 LAB — CBC WITH DIFFERENTIAL
BASOS ABS: 0 10*3/uL (ref 0.0–0.2)
BASOS: 1 %
EOS (ABSOLUTE): 0.4 10*3/uL (ref 0.0–0.4)
Eos: 5 %
Hematocrit: 43.8 % (ref 34.0–46.6)
Hemoglobin: 14.3 g/dL (ref 11.1–15.9)
IMMATURE GRANS (ABS): 0 10*3/uL (ref 0.0–0.1)
Immature Granulocytes: 0 %
LYMPHS: 32 %
Lymphocytes Absolute: 2.6 10*3/uL (ref 0.7–3.1)
MCH: 28.1 pg (ref 26.6–33.0)
MCHC: 32.6 g/dL (ref 31.5–35.7)
MCV: 86 fL (ref 79–97)
MONOS ABS: 0.5 10*3/uL (ref 0.1–0.9)
Monocytes: 7 %
NEUTROS PCT: 55 %
Neutrophils Absolute: 4.4 10*3/uL (ref 1.4–7.0)
RBC: 5.09 x10E6/uL (ref 3.77–5.28)
RDW: 15.5 % — AB (ref 12.3–15.4)
WBC: 7.9 10*3/uL (ref 3.4–10.8)

## 2016-11-20 LAB — VITAMIN D 25 HYDROXY (VIT D DEFICIENCY, FRACTURES): VIT D 25 HYDROXY: 27.3 ng/mL — AB (ref 30.0–100.0)

## 2016-11-20 LAB — TSH: TSH: 1.21 u[IU]/mL (ref 0.450–4.500)

## 2016-11-20 LAB — SEDIMENTATION RATE: Sed Rate: 2 mm/hr (ref 0–40)

## 2016-11-20 LAB — MAGNESIUM: MAGNESIUM: 2 mg/dL (ref 1.6–2.3)

## 2016-11-23 ENCOUNTER — Other Ambulatory Visit: Payer: Self-pay | Admitting: Family Medicine

## 2016-11-23 DIAGNOSIS — J439 Emphysema, unspecified: Secondary | ICD-10-CM

## 2016-11-24 MED FILL — $VENTOLIN HFA 18G INHALER: 108 (90 BAS | 25 days supply | Qty: 18 | Fill #0 | Status: TO

## 2016-11-24 MED FILL — !DULERA 200 MCG/5 MCG INH: 200-5 | 30 days supply | Qty: 1 | Fill #1 | Status: TO

## 2016-11-25 ENCOUNTER — Telehealth: Payer: Self-pay | Admitting: *Deleted

## 2016-11-25 ENCOUNTER — Telehealth: Payer: Self-pay | Admitting: Pulmonary Disease

## 2016-11-25 NOTE — Telephone Encounter (Signed)
Patient verified DOB Patient is aware of labs being normal except vitamin d being a little low. Patient advised to pickup OTC 2000 units of vitamin d and take the supplement daily. Patient expressed her understanding and had no further questions.

## 2016-11-25 NOTE — Telephone Encounter (Signed)
-----   Message from Anders SimmondsAngela M McClung, [place]-C sent at 11/22/2016  2:16 PM EDT ----- Please call patient.  Her labs were all normal except that her vitamin D was a little low.  She should take over the counter vitamin D 2000units daily.  Thanks, Georgian CoAngela McClung, PA-C

## 2016-11-25 NOTE — Telephone Encounter (Signed)
[facility] PA was started on CMM. Key is [id number]. Determination will be made within 24-48 hours. Will route to FrankewingAshley for follow up.

## 2016-11-26 NOTE — Telephone Encounter (Signed)
Per CMM, PA was approved. [facility] is aware and will process medication for patient. Nothing further needed.

## 2016-11-27 DIAGNOSIS — M5412 Radiculopathy, cervical region: Secondary | ICD-10-CM | POA: Diagnosis not present

## 2016-12-17 DIAGNOSIS — M50122 Cervical disc disorder at C5-C6 level with radiculopathy: Secondary | ICD-10-CM | POA: Diagnosis not present

## 2016-12-17 DIAGNOSIS — E348 Other specified endocrine disorders: Secondary | ICD-10-CM | POA: Diagnosis not present

## 2016-12-17 DIAGNOSIS — M5412 Radiculopathy, cervical region: Secondary | ICD-10-CM | POA: Diagnosis not present

## 2016-12-24 DIAGNOSIS — E348 Other specified endocrine disorders: Secondary | ICD-10-CM | POA: Diagnosis not present

## 2016-12-31 DIAGNOSIS — M50122 Cervical disc disorder at C5-C6 level with radiculopathy: Secondary | ICD-10-CM | POA: Diagnosis not present

## 2016-12-31 DIAGNOSIS — E348 Other specified endocrine disorders: Secondary | ICD-10-CM | POA: Diagnosis not present

## 2016-12-31 DIAGNOSIS — M5412 Radiculopathy, cervical region: Secondary | ICD-10-CM | POA: Diagnosis not present

## 2017-01-07 ENCOUNTER — Encounter: Payer: Self-pay | Admitting: Family Medicine

## 2017-01-07 ENCOUNTER — Ambulatory Visit: Payer: Medicare Other | Attending: Family Medicine | Admitting: Family Medicine

## 2017-01-07 VITALS — BP 160/84 | HR 84 | Temp 98.1°F | Resp 16 | Wt 141.4 lb

## 2017-01-07 DIAGNOSIS — Z79899 Other long term (current) drug therapy: Secondary | ICD-10-CM | POA: Insufficient documentation

## 2017-01-07 DIAGNOSIS — I10 Essential (primary) hypertension: Secondary | ICD-10-CM | POA: Insufficient documentation

## 2017-01-07 DIAGNOSIS — N3946 Mixed incontinence: Secondary | ICD-10-CM | POA: Diagnosis not present

## 2017-01-07 DIAGNOSIS — F17210 Nicotine dependence, cigarettes, uncomplicated: Secondary | ICD-10-CM | POA: Diagnosis not present

## 2017-01-07 DIAGNOSIS — H547 Unspecified visual loss: Secondary | ICD-10-CM | POA: Insufficient documentation

## 2017-01-07 DIAGNOSIS — K0889 Other specified disorders of teeth and supporting structures: Secondary | ICD-10-CM | POA: Diagnosis not present

## 2017-01-07 MED ORDER — AMLODIPINE BESYLATE 5 MG PO TABS: 5.0000 mg | ORAL_TABLET | Freq: Every day | ORAL | 2 refills | Status: DC

## 2017-01-07 MED FILL — AMLODIPINE BESYLATE 5 MG TA: 5 | 30 days supply | Qty: 30 | Fill #0

## 2017-01-07 NOTE — Assessment & Plan Note (Signed)
A: hypertensive Med: none P: Start amlodipine 5 mg daily Increase if needed

## 2017-01-07 NOTE — Assessment & Plan Note (Signed)
Incontinence supplies ordered Rx sent to [facility]

## 2017-01-07 NOTE — Patient Instructions (Addendum)
Maria Oliver was seen today for hypertension, generalized body aches, referral and urinary incontinence.  Diagnoses and all orders for this visit:  Essential hypertension -     amLODipine (NORVASC) 5 MG tablet; Take 1 tablet (5 mg total) by mouth daily.  Pain, dental -     Ambulatory referral to Dentistry  Decreased visual acuity -     Ambulatory referral to Optometry  Mixed incontinence urge and stress (female)(female)   You will be called about your referrals I will send the order to [facility] for your incontinence supplies  F/u in 3 weeks for HTN  Dr. Armen PickupFunches

## 2017-01-07 NOTE — Progress Notes (Signed)
Subjective:  Patient ID: Maria Oliver, female    DOB: [birth year]  Age: 56 y.o. MRN: [id number]  CC: Hypertension; Generalized Body Aches; Referral (dentist and eye doctor); and Urinary Incontinence   HPI Maria Oliver presents for   1.  HTN: she stopped HCTZ. She was suppose to decrease the dose to 12.5 mg due to hypokalemia. She reports some headache. She reports stress with her son recently imprisoned for shooting his friends and drug abuse. Her 615 yo grandson in foster care.  2. Urinary incontinence: for the past 5 years. She leaks when she coughs or laughs. She also has overflow incontinence. She denies dysuria.  She purchases incontinence pads over the counter. She uses 3 days pads a day.   3. Referrals: she has medicaid and medicare now. She request referral to dentist and eye doctor.   Past Surgical History:  Procedure Laterality Date  . COLON SURGERY  2010   per patient "colon take down"  . COLOSTOMY  2010  . LAPAROSCOPIC CHOLECYSTECTOMY SINGLE SITE WITH INTRAOPERATIVE CHOLANGIOGRAM N/A 09/09/2016   Procedure: LAPAROSCOPIC CHOLECYSTECTOMY  WITH INTRAOPERATIVE CHOLANGIOGRAM;  Surgeon: Karie SodaSteven Gross, MD;  Location: [facility];  Service: General;  Laterality: N/A;  . TUBAL LIGATION      Social History  Substance Use Topics  . Smoking status: Current Every Day Smoker    Packs/day: 0.50    Years: 40.00    Types: Cigarettes    Last attempt to quit: 03/08/2016  . Smokeless tobacco: Never Used     Comment: Recently quit, started back to 4 cigs/daily  . Alcohol use No    Outpatient Medications Prior to Visit  Medication Sig Dispense Refill  . cholecalciferol (VITAMIN D) 1000 units tablet Take 1,000 Units by mouth every other day.     . diclofenac sodium (VOLTAREN) 1 % GEL Apply 4 g topically 4 (four) times daily. To R shoulder and neck 100 g 2  . hydrochlorothiazide (MICROZIDE) 12.5 MG capsule Take 1 capsule (12.5 mg total) by mouth daily. (Patient not taking: Reported on  11/19/2016) 30 capsule 5  . HYDROmorphone (DILAUDID) 2 MG tablet Take 1-2 tablets (2-4 mg total) by mouth every 6 (six) hours as needed for severe pain. (Patient not taking: Reported on 10/14/2016) 30 tablet 0  . hydrOXYzine (ATARAX/VISTARIL) 10 MG tablet Take 10 mg by mouth 3 (three) times daily as needed for anxiety.  2  . ibuprofen (ADVIL,MOTRIN) 600 MG tablet Take 1 tablet (600 mg total) by mouth every 6 (six) hours as needed for moderate pain. 30 tablet 0  . methocarbamol (ROBAXIN) 500 MG tablet Take 2 tablets (1,000 mg total) by mouth every 8 (eight) hours as needed for muscle spasms. 30 tablet 0  . mometasone-formoterol (DULERA) 200-5 MCG/ACT AERO Inhale 2 puffs into the lungs 2 (two) times daily. 1 Inhaler 5  . Multiple Vitamin (MULTIVITAMIN WITH MINERALS) TABS tablet Take 1 tablet by mouth every other day.    . ondansetron (ZOFRAN ODT) 8 MG disintegrating tablet Take 1 tablet (8 mg total) by mouth every 8 (eight) hours as needed for nausea or vomiting. 10 tablet 0  . PARoxetine (PAXIL) 30 MG tablet Take 30 mg by mouth daily.    Marland Kitchen. tiotropium (SPIRIVA HANDIHALER) 18 MCG inhalation capsule Place 1 capsule (18 mcg total) into inhaler and inhale daily. 90 capsule 3  . triamcinolone ointment (KENALOG) 0.5 % Apply 1 application topically 2 (two) times daily. Apply to back rash 60 g 0  . VENTOLIN  HFA 108 (90 Base) MCG/ACT inhaler INHALE 2 PUFFS INTO THE LUNGS EVERY 6 HOURS AS NEEDED FOR WHEEZING OR SHORTNESS OF BREATH. 54 g 0   No facility-administered medications prior to visit.     ROS Review of Systems  Constitutional: Negative for chills and fever.  HENT: Negative for dental problem and ear pain.   Eyes: Negative for visual disturbance.  Respiratory: Negative for shortness of breath.   Cardiovascular: Negative for chest pain.  Gastrointestinal: Negative for abdominal pain and blood in stool.  Musculoskeletal: Positive for neck pain. Negative for arthralgias and back pain.  Skin: Positive  for rash.  Allergic/Immunologic: Negative for immunocompromised state.  Neurological: Negative for dizziness and headaches.  Hematological: Negative for adenopathy. Does not bruise/bleed easily.  Psychiatric/Behavioral: Negative for dysphoric mood and suicidal ideas.    Objective:  BP (!) 181/96   Pulse 84   Temp 98.1 F (36.7 C) (Oral)   Resp 16   Wt 141 lb 6.4 oz (64.1 kg)   SpO2 97%   BMI 24.27 kg/m   BP/Weight 01/07/2017 11/19/2016 10/14/2016  Systolic BP 181 128 111  Diastolic BP 96 78 67  Wt. (Lbs) 141.4 143 142.6  BMI 24.27 24.55 24.48    Physical Exam  Constitutional: She is oriented to person, place, and time. She appears well-developed and well-nourished. No distress.  HENT:  Head: Normocephalic and atraumatic.  Nose: Right sinus exhibits frontal sinus tenderness.  Cardiovascular: Normal rate, regular rhythm, normal heart sounds and intact distal pulses.   Pulmonary/Chest: Effort normal and breath sounds normal.  Abdominal: A hernia is present. Hernia confirmed positive in the ventral area.    Musculoskeletal: She exhibits no edema.  Neurological: She is alert and oriented to person, place, and time.  Skin: Skin is warm and dry. No rash noted.     Psychiatric: She has a normal mood and affect.     Chemistry      Component Value Date/Time   NA 144 11/19/2016 1149   K 4.1 11/19/2016 1149   CL 104 11/19/2016 1149   CO2 25 11/19/2016 1149   BUN 7 11/19/2016 1149   CREATININE 0.76 11/19/2016 1149   CREATININE 0.67 08/11/2015 1054      Component Value Date/Time   CALCIUM 9.5 11/19/2016 1149   ALKPHOS 67 09/02/2016 1955   AST 15 09/02/2016 1955   ALT 12 (L) 09/02/2016 1955   BILITOT 0.3 09/02/2016 1955      Assessment & Plan:  Maria Oliver was seen today for hypertension, generalized body aches, referral and urinary incontinence.  Diagnoses and all orders for this visit:  Essential hypertension -     amLODipine (NORVASC) 5 MG tablet; Take 1 tablet (5 mg  total) by mouth daily.  Pain, dental -     Ambulatory referral to Dentistry  Decreased visual acuity -     Ambulatory referral to Optometry  Mixed incontinence urge and stress (female)(female)   There are no diagnoses linked to this encounter.  No orders of the defined types were placed in this encounter.   Follow-up: Return in about 3 weeks (around 01/28/2017) for HTN.   Dessa PhiJosalyn Terek Bee MD

## 2017-01-19 DIAGNOSIS — I10 Essential (primary) hypertension: Secondary | ICD-10-CM | POA: Diagnosis not present

## 2017-01-19 DIAGNOSIS — E348 Other specified endocrine disorders: Secondary | ICD-10-CM | POA: Diagnosis not present

## 2017-01-19 DIAGNOSIS — M5412 Radiculopathy, cervical region: Secondary | ICD-10-CM | POA: Diagnosis not present

## 2017-02-04 ENCOUNTER — Ambulatory Visit: Payer: Medicare Other | Admitting: Family Medicine

## 2017-02-25 ENCOUNTER — Other Ambulatory Visit: Payer: Self-pay | Admitting: Family Medicine

## 2017-02-25 DIAGNOSIS — J439 Emphysema, unspecified: Secondary | ICD-10-CM

## 2017-03-12 ENCOUNTER — Encounter (HOSPITAL_COMMUNITY): Payer: Self-pay

## 2017-03-12 DIAGNOSIS — I10 Essential (primary) hypertension: Secondary | ICD-10-CM | POA: Insufficient documentation

## 2017-03-12 DIAGNOSIS — Z79899 Other long term (current) drug therapy: Secondary | ICD-10-CM | POA: Diagnosis not present

## 2017-03-12 DIAGNOSIS — F17210 Nicotine dependence, cigarettes, uncomplicated: Secondary | ICD-10-CM | POA: Insufficient documentation

## 2017-03-12 DIAGNOSIS — L739 Follicular disorder, unspecified: Secondary | ICD-10-CM | POA: Insufficient documentation

## 2017-03-12 DIAGNOSIS — J449 Chronic obstructive pulmonary disease, unspecified: Secondary | ICD-10-CM | POA: Diagnosis not present

## 2017-03-12 DIAGNOSIS — J45909 Unspecified asthma, uncomplicated: Secondary | ICD-10-CM | POA: Insufficient documentation

## 2017-03-12 DIAGNOSIS — R21 Rash and other nonspecific skin eruption: Secondary | ICD-10-CM | POA: Diagnosis present

## 2017-03-12 NOTE — ED Triage Notes (Addendum)
Pt. Reports breaking out with open spots on face, neck, and scalp. Pt. Reports the spots will get better and then come back. Pt. States the spots are very sore and has puss drainage. Pt. Very itchy. Pt. Reports having back to back MRI with contrast.

## 2017-03-13 ENCOUNTER — Emergency Department (HOSPITAL_COMMUNITY)
Admission: EM | Admit: 2017-03-13 | Discharge: 2017-03-13 | Disposition: A | Discharge status: Home / Self Care | Payer: Medicare Other | Attending: Emergency Medicine | Admitting: Emergency Medicine

## 2017-03-13 DIAGNOSIS — L739 Follicular disorder, unspecified: Secondary | ICD-10-CM

## 2017-03-13 MED ORDER — HYDROXYZINE HCL 25 MG PO TABS: 25.0000 mg | ORAL_TABLET | Freq: Four times a day (QID) | ORAL | 0 refills | Status: DC

## 2017-03-13 MED ORDER — SULFAMETHOXAZOLE-TRIMETHOPRIM 800-160 MG PO TABS: 1.0000 | ORAL_TABLET | Freq: Two times a day (BID) | ORAL | 0 refills | Status: DC

## 2017-03-13 MED ORDER — HYDROXYZINE HCL 10 MG PO TABS: 10.0000 mg | ORAL_TABLET | Freq: Three times a day (TID) | ORAL | 2 refills | Status: DC | PRN

## 2017-03-13 MED ORDER — PREDNISONE 20 MG PO TABS: ORAL_TABLET | ORAL | 0 refills | Status: DC

## 2017-03-13 NOTE — ED Notes (Signed)
PT states understanding of care given, follow up care, and medication prescribed. PT is ambulated from ED to car with a steady gait.  

## 2017-03-13 NOTE — ED Provider Notes (Signed)
[facility]ER[facility]ENCY DEPT Provider Note   CSN: [id number] Arrival date & time: 03/12/17  2248     History   Chief Complaint Chief Complaint  Patient presents with  . Rash    HPI Maria Oliver is a 56 y.o. female.  Patient presents to the ER for evaluation of rash. Symptoms have been ongoing for one month. She reports that areas, but has a red swollen, sore spot. The area then breaks open and pus drains, then scabs over. There have been multiple waves of this type of rash over the last month. She reports that there is itching associated with the rash as well. She is unaware of any new skin products or anything that could've caused the rash.      Past Medical History:  Diagnosis Date  . Abdominal pain   . Anxiety   . Arthritis   . Asthma   . Chronic cholecystitis with calculus s/p lap cholecystectomy 09/09/2016 09/09/2016  . Complication of anesthesia    slow to arouse post operatively   . COPD (chronic obstructive pulmonary disease) (HCC)   . 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 Generalized headaches   . Hernia, inguinal, right   . Hypertension   . Leg swelling     Patient Active Problem List   Diagnosis Date Noted  . 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 Pruritic rash 10/14/2016  . Chronic narcotic use 09/09/2016  . Onychomycosis of right great toe 10/13/2015  . Osteoarthritis 08/11/2015  . Chronic abdominal pain 05/22/2015  . Chronic pain syndrome 05/22/2015  . Vitamin D insufficiency 02/06/2015  . Chronic knee pain 02/06/2015  . Ventral hernia without obstruction or gangrene 12/31/2014  . Anxiety 12/31/2014  . COPD (chronic obstructive pulmonary disease) with emphysema (HCC)   . Tobacco abuse 10/01/2009  . Essential hypertension 10/01/2009    Past Surgical History:  Procedure Laterality Date  . COLON SURGERY  2010   per patient "colon take down"  . COLOSTOMY  2010  . LAPAROSCOPIC  CHOLECYSTECTOMY SINGLE SITE WITH INTRAOPERATIVE CHOLANGIOGRAM N/A 09/09/2016   Procedure: LAPAROSCOPIC CHOLECYSTECTOMY  WITH INTRAOPERATIVE CHOLANGIOGRAM;  Surgeon: Karie Soda, MD;  Location: [facility];  Service: General;  Laterality: N/A;  . TUBAL LIGATION      OB History    No data available       Home Medications    Prior to Admission medications   Medication Sig Start Date End Date Taking? Authorizing Provider  amLODipine (NORVASC) 5 MG tablet Take 1 tablet (5 mg total) by mouth daily. 01/07/17   Funches, Gerilyn Nestle, MD  cholecalciferol (VITAMIN D) 1000 units tablet Take 1,000 Units by mouth every other day.     [provider]  diclofenac sodium (VOLTAREN) 1 % GEL Apply 4 g topically 4 (four) times daily. To R shoulder and neck 10/14/16   Funches, Gerilyn Nestle, MD  hydrOXYzine (ATARAX/VISTARIL) 10 MG tablet Take 1 tablet (10 mg total) by mouth 3 (three) times daily as needed for anxiety. 03/13/17   Gilda Crease, MD  hydrOXYzine (ATARAX/VISTARIL) 25 MG tablet Take 1 tablet (25 mg total) by mouth every 6 (six) hours. 03/13/17   Gilda Crease, MD  ibuprofen (ADVIL,MOTRIN) 600 MG tablet Take 1 tablet (600 mg total) by mouth every 6 (six) hours as needed for moderate pain. 09/02/16   Loren Racer, MD  methocarbamol (ROBAXIN) 500 MG tablet Take 2 tablets (1,000 mg total) by mouth every 8 (eight) hours as needed for  muscle spasms. 09/02/16   Loren Racer, MD  mometasone-formoterol (DULERA) 200-5 MCG/ACT AERO Inhale 2 puffs into the lungs 2 (two) times daily. 04/27/16   Lupita Leash, MD  Multiple Vitamin (MULTIVITAMIN WITH MINERALS) TABS tablet Take 1 tablet by mouth every other day.    [provider]  ondansetron (ZOFRAN ODT) 8 MG disintegrating tablet Take 1 tablet (8 mg total) by mouth every 8 (eight) hours as needed for nausea or vomiting. 08/05/16   Azalia Bilis, MD  PARoxetine (PAXIL) 30 MG tablet Take 30 mg by mouth daily. 07/21/16   [provider]  predniSONE (DELTASONE) 20 MG tablet 3 tabs po daily x 3 days, then 2 tabs x 3 days, then 1.5 tabs x 3 days, then 1 tab x 3 days, then 0.5 tabs x 3 days 03/13/17   Gilda Crease, MD  sulfamethoxazole-trimethoprim (BACTRIM DS,SEPTRA DS) 800-160 MG tablet Take 1 tablet by mouth 2 (two) times daily. 03/13/17   Gilda Crease, MD  tiotropium (SPIRIVA HANDIHALER) 18 MCG inhalation capsule Place 1 capsule (18 mcg total) into inhaler and inhale daily. 04/22/16   Funches, Gerilyn Nestle, MD  triamcinolone ointment (KENALOG) 0.5 % Apply 1 application topically 2 (two) times daily. Apply to back rash 10/14/16   Funches, Gerilyn Nestle, MD  VENTOLIN HFA 108 (90 Base) MCG/ACT inhaler INHALE 2 PUFFS BY MOUTH EVERY 6 HOURS AS NEEDED FOR SHORTNESS OF BREATH OR WHEEZING 02/28/17   Quentin Angst, MD    Family History Family History  Problem Relation Age of Onset  . Heart disease Mother 11  . Heart failure Mother   . Asthma Mother   . Emphysema Mother   . Alzheimer's disease Father 43  . Emphysema Brother   . Asthma Brother   . Colon cancer Neg Hx     Social History Social History  Substance Use Topics  . Smoking status: Current Every Day Smoker    Packs/day: 0.50    Years: 40.00    Types: Cigarettes    Last attempt to quit: 03/08/2016  . Smokeless tobacco: Never Used     Comment: Recently quit, started back to 4 cigs/daily  . Alcohol use No     Allergies   Aspirin; Ciprofloxacin; Codeine; Eggs or egg-derived products; Fentanyl; Hydrocodone; Lactose intolerance (gi); Oxycodone; and Penicillins   Review of Systems Review of Systems  Skin: Positive for rash.  All other systems reviewed and are negative.    Physical Exam Updated Vital Signs BP (!) 153/86 (BP Location: Right Arm)   Pulse 71   Temp 98.5 F (36.9 C) (Oral)   Resp 16   Ht  (1.626 m)   Wt 65.8 kg (145 lb)   SpO2 99%   BMI 24.89 kg/m   Physical Exam  Constitutional: She is oriented to person, place, and  time. She appears well-developed and well-nourished. No distress.  HENT:  Head: Normocephalic and atraumatic.  Right Ear: Hearing normal.  Left Ear: Hearing normal.  Nose: Nose normal.  Mouth/Throat: Oropharynx is clear and moist and mucous membranes are normal.  Eyes: Pupils are equal, round, and reactive to light. Conjunctivae and EOM are normal.  Neck: Normal range of motion. Neck supple.  Cardiovascular: Regular rhythm, S1 normal and S2 normal.  Exam reveals no gallop and no friction rub.   No murmur heard. Pulmonary/Chest: Effort normal and breath sounds normal. No respiratory distress. She exhibits no tenderness.  Abdominal: Soft. Normal appearance and bowel sounds are normal. There is no  hepatosplenomegaly. There is no tenderness. There is no rebound, no guarding, no tenderness at McBurney's point and negative Murphy's sign. No hernia.  Musculoskeletal: Normal range of motion.  Neurological: She is alert and oriented to person, place, and time. She has normal strength. No cranial nerve deficit or sensory deficit. Coordination normal. GCS eye subscore is 4. GCS verbal subscore is 5. GCS motor subscore is 6.  Skin: Skin is warm, dry and intact. Rash (Multiple discrete, approximately 0.5 cm lesions on scalp, face and extremities. Some are darkly erythematous and blanching, other areas are dried and scabbed over. No drainage noted.) noted. No cyanosis.  Psychiatric: She has a normal mood and affect. Her speech is normal and behavior is normal. Thought content normal.  Nursing note and vitals reviewed.    ED Treatments / Results  Labs (all labs ordered are listed, but only abnormal results are displayed) Labs Reviewed - No data to display  EKG  EKG Interpretation None       Radiology No results found.  Procedures Procedures (including critical care time)  Medications Ordered in ED Medications - No data to display   Initial Impression / Assessment and Plan / ED Course  I  have reviewed the triage vital signs and the nursing notes.  Pertinent labs & imaging results that were available during my care of the patient were reviewed by me and considered in my medical decision making (see chart for details).     Patient presents with rash. Symptoms ongoing for one month. She is concerned and MIP related to MRI contrast. This is extremely unlikely. Multiple lesions in various stages are noted. Early stages are tender and erythematous, later stages are dried out and scabbed over. She does report drainage from the areas, none noted currently. This might be folliculitis, started in the scalp. Does not resemble tinea. Cannot rule out some type of allergic reaction or possible bedbug bites. Patient will be treated empirically with Bactrim and prednisone, Vistaril for itch. Follow-up with primary doctor.  Final Clinical Impressions(s) / ED Diagnoses   Final diagnoses:  Folliculitis    New Prescriptions New Prescriptions   HYDROXYZINE (ATARAX/VISTARIL) 25 MG TABLET    Take 1 tablet (25 mg total) by mouth every 6 (six) hours.   PREDNISONE (DELTASONE) 20 MG TABLET    3 tabs po daily x 3 days, then 2 tabs x 3 days, then 1.5 tabs x 3 days, then 1 tab x 3 days, then 0.5 tabs x 3 days   SULFAMETHOXAZOLE-TRIMETHOPRIM (BACTRIM DS,SEPTRA DS) 800-160 MG TABLET    Take 1 tablet by mouth 2 (two) times daily.     Gilda Crease, MD 03/13/17 [phone]

## 2017-06-01 ENCOUNTER — Ambulatory Visit: Payer: Medicare Other | Attending: Internal Medicine | Admitting: Physician Assistant

## 2017-06-01 VITALS — BP 119/78 | HR 83 | Temp 99.0°F | Resp 18 | Ht 64.0 in | Wt 141.2 lb

## 2017-06-01 DIAGNOSIS — Z880 Allergy status to penicillin: Secondary | ICD-10-CM | POA: Diagnosis not present

## 2017-06-01 DIAGNOSIS — Z91012 Allergy to eggs: Secondary | ICD-10-CM | POA: Insufficient documentation

## 2017-06-01 DIAGNOSIS — Z886 Allergy status to analgesic agent status: Secondary | ICD-10-CM | POA: Insufficient documentation

## 2017-06-01 DIAGNOSIS — Z720 Tobacco use: Secondary | ICD-10-CM | POA: Diagnosis not present

## 2017-06-01 DIAGNOSIS — F419 Anxiety disorder, unspecified: Secondary | ICD-10-CM

## 2017-06-01 DIAGNOSIS — Z885 Allergy status to narcotic agent status: Secondary | ICD-10-CM | POA: Diagnosis not present

## 2017-06-01 DIAGNOSIS — F17200 Nicotine dependence, unspecified, uncomplicated: Secondary | ICD-10-CM | POA: Insufficient documentation

## 2017-06-01 DIAGNOSIS — J449 Chronic obstructive pulmonary disease, unspecified: Secondary | ICD-10-CM | POA: Diagnosis not present

## 2017-06-01 DIAGNOSIS — Z881 Allergy status to other antibiotic agents status: Secondary | ICD-10-CM | POA: Insufficient documentation

## 2017-06-01 DIAGNOSIS — M1990 Unspecified osteoarthritis, unspecified site: Secondary | ICD-10-CM | POA: Diagnosis not present

## 2017-06-01 DIAGNOSIS — Z79899 Other long term (current) drug therapy: Secondary | ICD-10-CM | POA: Insufficient documentation

## 2017-06-01 DIAGNOSIS — Z9049 Acquired absence of other specified parts of digestive tract: Secondary | ICD-10-CM | POA: Diagnosis not present

## 2017-06-01 DIAGNOSIS — R030 Elevated blood-pressure reading, without diagnosis of hypertension: Secondary | ICD-10-CM | POA: Diagnosis not present

## 2017-06-01 MED ORDER — PAROXETINE HCL 30 MG PO TABS: 30.0000 mg | ORAL_TABLET | Freq: Every day | ORAL | 3 refills | Status: DC

## 2017-06-01 NOTE — Progress Notes (Signed)
Patient would like to be back on blood pressure medications. Patient stated she's been getting headaches and dizziness.

## 2017-06-01 NOTE — Progress Notes (Signed)
Maria Oliver, is a 56 y.o. female  NGE:[id number]  MWN:[id number]  DOB - [birth year]  Subjective:  Chief Complaint and HPI: Maria Oliver is a 56 y.o. female here for BP and anxiety concerns.  She has been on BP meds in the past.   She feels like her BP has been high and it was up a few weeks ago when she was having some teeth pulled.  Otherwise she hasn't checked her BP out of the office.  No CP, No SOB.  No HA.  BP is great here today.    She stopped taking her paxil about 3 months ago.  Since that time, she has episodes of feeling dizzy and anxious and has started "picking" her skin.  She wants to get back on Paxil  Still smoking and not interested in quitting.    ED/Hospital notes reviewed.   Social History: no alcohol, + smoker, lives with daughter and grandson, has a son in prison.    ROS:   Constitutional:  No f/c, No night sweats, No unexplained weight loss. EENT:  No vision changes, No blurry vision, No hearing changes. No mouth, throat, or ear problems.  Respiratory: No cough, No SOB Cardiac: No CP, no palpitations GI:  No abd pain, No N/V/D. GU: No Urinary s/sx Musculoskeletal: No joint pain Neuro: No headache, Occasional dizziness, no motor weakness.  Skin: No rash Endocrine:  No polydipsia. No polyuria.  Psych: Denies SI/HI  No problems updated.  ALLERGIES: Allergies  Allergen Reactions  . Aspirin Other (See Comments)    REACTION: GI upset/Hernia   . Ciprofloxacin Other (See Comments)    Tendonitis, joint pain, and nausea.  "Interfered with her HCTZ" (also)  . Codeine Nausea And Vomiting and Other (See Comments)    (Also) GI upset/Hernia (CAN TOLERATE DILAUDID & MORPHINE)  . Eggs Or Egg-Derived Products Nausea Only    Stomach pains  . Fentanyl Nausea Only    Tolerates Dilaudid or morphine much better  . Hydrocodone Nausea And Vomiting  . Lactose Intolerance (Gi) Nausea And Vomiting and Other (See Comments)    (Also) vertigo  . Oxycodone Nausea And  Vomiting    Tolerates hydromorphone better  . Penicillins Rash    Has patient had a PCN reaction causing immediate rash, facial/tongue/throat swelling, SOB or lightheadedness with hypotension: Yes Has patient had a PCN reaction causing severe rash involving mucus membranes or skin necrosis: No Has patient had a PCN reaction that required hospitalization No Has patient had a PCN reaction occurring within the last 10 years: No If all of the above answers are "NO", then may proceed with Cephalosporin use.     PAST MEDICAL HISTORY: Past Medical History:  Diagnosis Date  . Abdominal pain   . Anxiety   . Arthritis   . Asthma   . Chronic cholecystitis with calculus s/p lap cholecystectomy 09/09/2016 09/09/2016  . Complication of anesthesia    slow to arouse post operatively   . COPD (chronic obstructive pulmonary disease) (HCC)   . 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 Generalized headaches   . Hernia, inguinal, right   . Hypertension   . Leg swelling     MEDICATIONS AT HOME: Prior to Admission medications   Medication Sig Start Date End Date Taking? Authorizing Provider  VENTOLIN HFA 108 (90 Base) MCG/ACT inhaler INHALE 2 PUFFS BY MOUTH EVERY 6 HOURS AS NEEDED FOR SHORTNESS OF BREATH OR WHEEZING 02/28/17  Yes Quentin Angst, MD  cholecalciferol (VITAMIN D) 1000 units tablet Take 1,000 Units by mouth every other day.     [provider]  ibuprofen (ADVIL,MOTRIN) 600 MG tablet Take 1 tablet (600 mg total) by mouth every 6 (six) hours as needed for moderate pain. Patient not taking: Reported on 06/01/2017 09/02/16   Loren RacerYelverton, David, MD  methocarbamol (ROBAXIN) 500 MG tablet Take 2 tablets (1,000 mg total) by mouth every 8 (eight) hours as needed for muscle spasms. Patient not taking: Reported on 06/01/2017 09/02/16   Loren RacerYelverton, David, MD  mometasone-formoterol [facility](DULERA) 200-5 MCG/ACT AERO Inhale 2 puffs into the lungs 2 (two) times  daily. Patient not taking: Reported on 06/01/2017 04/27/16   Lupita LeashMcQuaid, Douglas B, MD  Multiple Vitamin (MULTIVITAMIN WITH MINERALS) TABS tablet Take 1 tablet by mouth every other day.    [provider]  PARoxetine (PAXIL) 30 MG tablet Take 1 tablet (30 mg total) by mouth daily. 06/01/17   Anders SimmondsMcClung, Sindi Beckworth M, PA-C  tiotropium (SPIRIVA HANDIHALER) 18 MCG inhalation capsule Place 1 capsule (18 mcg total) into inhaler and inhale daily. Patient not taking: Reported on 06/01/2017 04/22/16   Dessa PhiFunches, Josalyn, MD  triamcinolone ointment (KENALOG) 0.5 % Apply 1 application topically 2 (two) times daily. Apply to back rash Patient not taking: Reported on 06/01/2017 10/14/16   Dessa PhiFunches, Josalyn, MD     Objective:  EXAM:   Vitals:   06/01/17 1414  BP: 119/78  Pulse: 83  Resp: 18  Temp: 99 F (37.2 C)  TempSrc: Oral  SpO2: 97%  Weight: 141 lb 3.2 oz (64 kg)  Height: 5\' 4"  (1.626 m)    General appearance : A&OX3. NAD. Non-toxic-appearing HEENT: Atraumatic and Normocephalic.  PERRLA. EOM intact.   Neck: supple, no JVD. No cervical lymphadenopathy. No thyromegaly Chest/Lungs:  Breathing-non-labored, Good air entry bilaterally, breath sounds normal without rales, rhonchi, or wheezing  CVS: S1 S2 regular, no murmurs, gallops, rubs  Extremities: Bilateral Lower Ext shows no edema, both legs are warm to touch with = pulse throughout Neurology:  CN II-XII grossly intact, Non focal.   Psych:  TP linear. J/I WNL. pressured speech at times. Appropriate eye contact and affect. Mood is anxious. Skin:  No Rash.  Some scabbing on face and neck from picking, no active infection  Data Review Lab Results  Component Value Date   HGBA1C 5.6 08/11/2015     Assessment & Plan   1. Anxiety and picking Restart.  1/2 tab qod X 1 week, then 1/2 tab daily X 1 week then- - PARoxetine (PAXIL) 30 MG tablet; Take 1 tablet (30 mg total) by mouth daily.  Dispense: 30 tablet; Refill: 3  2. Elevated BP without  diagnosis of hypertension BP great here today Check blood pressure 3-5 times/week and record and bring to your next visit.   3. Tobacco abuse Not ready for change.  Smoking and dangers of nicotine have been discussed at length. Long term health consequences of smoking reviewed in detail.  Methods for helping with cessation have been reviewed.  Patient expresses understanding.   Patient have been counseled extensively about nutrition and exercise  Return in about 4 weeks (around 06/28/2017) [facility]; f/up anxiety/BP readings.  The patient was given clear instructions to go to ER or return to medical center if symptoms don't improve, worsen or new problems develop. The patient verbalized understanding. The patient was told to call to get lab results if they haven't heard anything in the next week.  Georgian CoAngela Varsha Knock, PA-C [facility], [place] [phone]   06/01/2017, 2:46 PMPatient ID: Maria Oliver, female   DOB: [birth year], 58 y.o.   MRN: [id number]

## 2017-06-01 NOTE — Patient Instructions (Signed)
Check blood pressure 3-5 times/week and record and bring to your next visit.

## 2017-06-28 ENCOUNTER — Encounter: Payer: Self-pay | Admitting: Family Medicine

## 2017-06-28 ENCOUNTER — Other Ambulatory Visit (HOSPITAL_COMMUNITY)
Admission: RE | Admit: 2017-06-28 | Discharge: 2017-06-28 | Disposition: A | Discharge status: Home / Self Care | Payer: Medicare Other | Source: Ambulatory Visit | Attending: Family Medicine | Admitting: Family Medicine

## 2017-06-28 ENCOUNTER — Ambulatory Visit: Payer: Medicare Other | Attending: Family Medicine | Admitting: Family Medicine

## 2017-06-28 VITALS — BP 148/87 | HR 83 | Temp 98.6°F | Resp 18 | Ht 64.0 in | Wt 141.0 lb

## 2017-06-28 DIAGNOSIS — R300 Dysuria: Secondary | ICD-10-CM | POA: Insufficient documentation

## 2017-06-28 DIAGNOSIS — I10 Essential (primary) hypertension: Secondary | ICD-10-CM

## 2017-06-28 DIAGNOSIS — F419 Anxiety disorder, unspecified: Secondary | ICD-10-CM | POA: Diagnosis not present

## 2017-06-28 DIAGNOSIS — Z79899 Other long term (current) drug therapy: Secondary | ICD-10-CM | POA: Diagnosis not present

## 2017-06-28 DIAGNOSIS — K439 Ventral hernia without obstruction or gangrene: Secondary | ICD-10-CM | POA: Insufficient documentation

## 2017-06-28 DIAGNOSIS — R109 Unspecified abdominal pain: Secondary | ICD-10-CM | POA: Diagnosis not present

## 2017-06-28 DIAGNOSIS — L089 Local infection of the skin and subcutaneous tissue, unspecified: Secondary | ICD-10-CM

## 2017-06-28 DIAGNOSIS — Z13220 Encounter for screening for lipoid disorders: Secondary | ICD-10-CM

## 2017-06-28 DIAGNOSIS — F17210 Nicotine dependence, cigarettes, uncomplicated: Secondary | ICD-10-CM | POA: Insufficient documentation

## 2017-06-28 DIAGNOSIS — J449 Chronic obstructive pulmonary disease, unspecified: Secondary | ICD-10-CM | POA: Diagnosis not present

## 2017-06-28 DIAGNOSIS — G8929 Other chronic pain: Secondary | ICD-10-CM | POA: Diagnosis not present

## 2017-06-28 DIAGNOSIS — Z0189 Encounter for other specified special examinations: Secondary | ICD-10-CM | POA: Diagnosis not present

## 2017-06-28 MED ORDER — LOSARTAN POTASSIUM 50 MG PO TABS: 50.0000 mg | ORAL_TABLET | Freq: Every day | ORAL | 2 refills | Status: DC

## 2017-06-28 MED ORDER — SULFAMETHOXAZOLE-TRIMETHOPRIM 800-160 MG PO TABS: 1.0000 | ORAL_TABLET | Freq: Two times a day (BID) | ORAL | 0 refills | Status: DC

## 2017-06-28 MED FILL — LOSARTAN POTASSIUM 50 MG TA: 50 | 30 days supply | Qty: 30 | Fill #0

## 2017-06-28 MED FILL — SULFAMETHOXAZOLE-TMP DS TAB: 800-160 | 7 days supply | Qty: 14 | Fill #0

## 2017-06-28 NOTE — Progress Notes (Signed)
Subjective:  Patient ID: Maria Oliver, female    DOB: 1961/01/08  Age: 57 y.o. MRN: [id number]  CC: Establish Care   HPI [name] presents to establish care. PMH includes hypertension, anxiety, copd, and ventral hernia.  Essential hypertension She does not bring BP log to office visit. Cardiac symptoms none.  Cardiovascular risk factors: hypertension, sedentary lifestyle and smoking/ tobacco exposure. She smokes 6 to 10 cigarettes, smoking history of 40 years. Denies any family history of lung cancer. Use of agents associated with hypertension: none. History of target organ damage: none.  Skin infection Onset 4 months ago. Location to face, chest, and back. Associated symptoms include pruritis, redness, and drainage. She reports using Dermasil lotion for symptoms with minimal symptoms improvement.   Dysuria She reports urinary symptoms of pain/pressure when urinating. Onset 1 week ago. Associated symptoms include cloudy urine. She denies any hematuria, malodorus urine, vaginal discharge, or abdominal pain. She does reports history of ventral hernia 5 years ago and consultation about 8 months ago.She denies any nausea or vomiting.    Anxiety History of anxiety disorder. Symptoms include panic attacks, excessive worry, and depressed mood. She denies any SI/HI. She reports starting Paxil 1 month ago. She declines speaking with LCSW today or referral for counseling resources at this time.   Outpatient Medications Prior to Visit  Medication Sig Dispense Refill  . Multiple Vitamin (MULTIVITAMIN WITH MINERALS) TABS tablet Take 1 tablet by mouth every other day.    Marland Kitchen PARoxetine (PAXIL) 30 MG tablet Take 1 tablet (30 mg total) by mouth daily. 30 tablet 3  . VENTOLIN HFA 108 (90 Base) MCG/ACT inhaler INHALE 2 PUFFS BY MOUTH EVERY 6 HOURS AS NEEDED FOR SHORTNESS OF BREATH OR WHEEZING 18 g 0  . cholecalciferol (VITAMIN D) 1000 units tablet Take 1,000 Units by mouth every other day.     .  ibuprofen (ADVIL,MOTRIN) 600 MG tablet Take 1 tablet (600 mg total) by mouth every 6 (six) hours as needed for moderate pain. (Patient not taking: Reported on 06/01/2017) 30 tablet 0  . methocarbamol (ROBAXIN) 500 MG tablet Take 2 tablets (1,000 mg total) by mouth every 8 (eight) hours as needed for muscle spasms. (Patient not taking: Reported on 06/01/2017) 30 tablet 0  . mometasone-formoterol (DULERA) 200-5 MCG/ACT AERO Inhale 2 puffs into the lungs 2 (two) times daily. (Patient not taking: Reported on 06/01/2017) 1 Inhaler 5  . tiotropium (SPIRIVA HANDIHALER) 18 MCG inhalation capsule Place 1 capsule (18 mcg total) into inhaler and inhale daily. (Patient not taking: Reported on 06/01/2017) 90 capsule 3  . triamcinolone ointment (KENALOG) 0.5 % Apply 1 application topically 2 (two) times daily. Apply to back rash (Patient not taking: Reported on 06/01/2017) 60 g 0   No facility-administered medications prior to visit.     ROS Review of Systems  Constitutional: Negative.   Respiratory: Negative.   Cardiovascular: Negative.   Gastrointestinal: Negative.   Genitourinary: Positive for dysuria.  Skin: Positive for wound.  Psychiatric/Behavioral: Negative for suicidal ideas. The patient is nervous/anxious.     Objective:  BP (!) 148/87 (BP Location: Right Arm, Patient Position: Sitting, Cuff Size: Normal)   Pulse 83   Temp 98.6 F (37 C) (Oral)   Resp 18   Ht 5\' 4"  (1.626 m)   Wt 141 lb (64 kg)   SpO2 99%   BMI 24.20 kg/m   BP/Weight 06/28/2017 06/01/2017 03/13/2017  Systolic BP 148 119 130  Diastolic BP 87 78 78  Wt. (Lbs) 141 141.2 -  BMI 24.2 24.24 24.89     Physical Exam  Constitutional: She is oriented to person, place, and time. She appears well-developed and well-nourished.  Cardiovascular: Normal rate, regular rhythm, normal heart sounds and intact distal pulses.  Pulmonary/Chest: Effort normal and breath sounds normal.  Abdominal: Soft. Bowel sounds are normal.    Genitourinary:  Genitourinary Comments: Urine dip  Neurological: She is oriented to person, place, and time.  Skin: Skin is warm and dry.  Scattered pustules with areas of excoriation and abrasions to face, chest, and back. Erythema present.   Psychiatric: Her mood appears anxious. She expresses no homicidal and no suicidal ideation. She expresses no suicidal plans and no homicidal plans. She is communicative. She is attentive.  Nursing note and vitals reviewed.    Assessment & Plan:   1. Essential hypertension  - losartan (COZAAR) 50 MG tablet; Take 1 tablet (50 mg total) by mouth daily.  Dispense: 30 tablet; Refill: 2  2. Skin infection  - sulfamethoxazole-trimethoprim (BACTRIM DS) 800-160 MG tablet; Take 1 tablet by mouth 2 (two) times daily.  Dispense: 14 tablet; Refill: 0  3. Dysuria  - Urine cytology ancillary only  4. Anxiety Continue Paxil use.  5. Screening cholesterol level  - Lipid Panel       Follow-up: Return in about 2 weeks (around 07/12/2017) for HTN/ Skin .   Lizbeth BarkMandesia R Hairston FNP

## 2017-06-28 NOTE — Patient Instructions (Addendum)
Wash skin with an over the counter bacterial soap once to twice daily. Discard any old razors or make up.   DASH Eating Plan DASH stands for "Dietary Approaches to Stop Hypertension." The DASH eating plan is a healthy eating plan that has been shown to reduce high blood pressure (hypertension). It may also reduce your risk for type 2 diabetes, heart disease, and stroke. The DASH eating plan may also help with weight loss. What are tips for following this plan? General guidelines  Avoid eating more than 2,300 mg (milligrams) of salt (sodium) a day. If you have hypertension, you may need to reduce your sodium intake to 1,500 mg a day.  Limit alcohol intake to no more than 1 drink a day for nonpregnant women and 2 drinks a day for men. One drink equals 12 oz of beer, 5 oz of wine, or 1 oz of hard liquor.  Work with your health care provider to maintain a healthy body weight or to lose weight. Ask what an ideal weight is for you.  Get at least 30 minutes of exercise that causes your heart to beat faster (aerobic exercise) most days of the week. Activities may include walking, swimming, or biking.  Work with your health care provider or diet and nutrition specialist (dietitian) to adjust your eating plan to your individual calorie needs. Reading food labels  Check food labels for the amount of sodium per serving. Choose foods with less than 5 percent of the Daily Value of sodium. Generally, foods with less than 300 mg of sodium per serving fit into this eating plan.  To find whole grains, look for the word "whole" as the first word in the ingredient list. Shopping  Buy products labeled as "low-sodium" or "no salt added."  Buy fresh foods. Avoid canned foods and premade or frozen meals. Cooking  Avoid adding salt when cooking. Use salt-free seasonings or herbs instead of table salt or sea salt. Check with your health care provider or pharmacist before using salt substitutes.  Do not fry  foods. Cook foods using healthy methods such as baking, boiling, grilling, and broiling instead.  Cook with heart-healthy oils, such as olive, canola, soybean, or sunflower oil. Meal planning   Eat a balanced diet that includes: ? 5 or more servings of fruits and vegetables each day. At each meal, try to fill half of your plate with fruits and vegetables. ? Up to 6-8 servings of whole grains each day. ? Less than 6 oz of lean meat, poultry, or fish each day. A 3-oz serving of meat is about the same size as a deck of cards. One egg equals 1 oz. ? 2 servings of low-fat dairy each day. ? A serving of nuts, seeds, or beans 5 times each week. ? Heart-healthy fats. Healthy fats called Omega-3 fatty acids are found in foods such as flaxseeds and coldwater fish, like sardines, salmon, and mackerel.  Limit how much you eat of the following: ? Canned or prepackaged foods. ? Food that is high in trans fat, such as fried foods. ? Food that is high in saturated fat, such as fatty meat. ? Sweets, desserts, sugary drinks, and other foods with added sugar. ? Full-fat dairy products.  Do not salt foods before eating.  Try to eat at least 2 vegetarian meals each week.  Eat more home-cooked food and less restaurant, buffet, and fast food.  When eating at a restaurant, ask that your food be prepared with less salt or no  salt, if possible. What foods are recommended? The items listed may not be a complete list. Talk with your dietitian about what dietary choices are best for you. Grains Whole-grain or whole-wheat bread. Whole-grain or whole-wheat pasta. Brown rice. [place]. Bulgur. Whole-grain and low-sodium cereals. Pita bread. Low-fat, low-sodium crackers. Whole-wheat flour tortillas. Vegetables Fresh or frozen vegetables (raw, steamed, roasted, or grilled). Low-sodium or reduced-sodium tomato and vegetable juice. Low-sodium or reduced-sodium tomato sauce and tomato paste. Low-sodium or  reduced-sodium canned vegetables. Fruits All fresh, dried, or frozen fruit. Canned fruit in natural juice (without added sugar). Meat and other protein foods Skinless chicken or [place]. Ground chicken or [place]. Pork with fat trimmed off. Fish and seafood. Egg whites. Dried beans, peas, or lentils. Unsalted nuts, nut butters, and seeds. Unsalted canned beans. Lean cuts of beef with fat trimmed off. Low-sodium, lean deli meat. Dairy Low-fat (1%) or fat-free (skim) milk. Fat-free, low-fat, or reduced-fat cheeses. Nonfat, low-sodium ricotta or cottage cheese. Low-fat or nonfat yogurt. Low-fat, low-sodium cheese. Fats and oils Soft margarine without trans fats. Vegetable oil. Low-fat, reduced-fat, or light mayonnaise and salad dressings (reduced-sodium). Canola, safflower, olive, soybean, and sunflower oils. Avocado. Seasoning and other foods Herbs. Spices. Seasoning mixes without salt. Unsalted popcorn and pretzels. Fat-free sweets. What foods are not recommended? The items listed may not be a complete list. Talk with your dietitian about what dietary choices are best for you. Grains Baked goods made with fat, such as croissants, muffins, or some breads. Dry pasta or rice meal packs. Vegetables Creamed or fried vegetables. Vegetables in a cheese sauce. Regular canned vegetables (not low-sodium or reduced-sodium). Regular canned tomato sauce and paste (not low-sodium or reduced-sodium). Regular tomato and vegetable juice (not low-sodium or reduced-sodium). Angie Fava. Olives. Fruits Canned fruit in a light or heavy syrup. Fried fruit. Fruit in cream or butter sauce. Meat and other protein foods Fatty cuts of meat. Ribs. Fried meat. Berniece Salines. Sausage. Bologna and other processed lunch meats. Salami. Fatback. Hotdogs. Bratwurst. Salted nuts and seeds. Canned beans with added salt. Canned or smoked fish. Whole eggs or egg yolks. Chicken or [place] with skin. Dairy Whole or 2% milk, cream, and half-and-half.  Whole or full-fat cream cheese. Whole-fat or sweetened yogurt. Full-fat cheese. Nondairy creamers. Whipped toppings. Processed cheese and cheese spreads. Fats and oils Butter. Stick margarine. Lard. Shortening. Ghee. Bacon fat. Tropical oils, such as coconut, palm kernel, or palm oil. Seasoning and other foods Salted popcorn and pretzels. Onion salt, garlic salt, seasoned salt, table salt, and sea salt. Worcestershire sauce. Tartar sauce. Barbecue sauce. Teriyaki sauce. Soy sauce, including reduced-sodium. Steak sauce. Canned and packaged gravies. Fish sauce. Oyster sauce. Cocktail sauce. Horseradish that you find on the shelf. Ketchup. Mustard. Meat flavorings and tenderizers. Bouillon cubes. Hot sauce and Tabasco sauce. Premade or packaged marinades. Premade or packaged taco seasonings. Relishes. Regular salad dressings. Where to find more information:  National Heart, Lung, and [place]: https://wilson-eaton.com/  American Heart Association: www.heart.org Summary  The DASH eating plan is a healthy eating plan that has been shown to reduce high blood pressure (hypertension). It may also reduce your risk for type 2 diabetes, heart disease, and stroke.  With the DASH eating plan, you should limit salt (sodium) intake to 2,300 mg a day. If you have hypertension, you may need to reduce your sodium intake to 1,500 mg a day.  When on the DASH eating plan, aim to eat more fresh fruits and vegetables, whole grains, lean proteins, low-fat dairy, and heart-healthy  fats.  Work with your health care provider or diet and nutrition specialist (dietitian) to adjust your eating plan to your individual calorie needs. This information is not intended to replace advice given to you by your health care provider. Make sure you discuss any questions you have with your health care provider. Document Released: 05/20/2011 Document Revised: 05/24/2016 Document Reviewed: 05/24/2016 Elsevier Interactive Patient Education   [employer].

## 2017-06-29 LAB — LIPID PANEL
Chol/HDL Ratio: 3.5 ratio (ref 0.0–4.4)
Cholesterol, Total: 184 mg/dL (ref 100–199)
HDL: 52 mg/dL (ref >39)
LDL CALC: 110 mg/dL — AB (ref 0–99)
Triglycerides: 112 mg/dL (ref 0–149)
VLDL CHOLESTEROL CAL: 22 mg/dL (ref 5–40)

## 2017-07-04 LAB — URINE CYTOLOGY ANCILLARY ONLY
Bacterial vaginitis: NEGATIVE
Candida vaginitis: NEGATIVE

## 2017-07-11 ENCOUNTER — Telehealth (INDEPENDENT_AMBULATORY_CARE_PROVIDER_SITE_OTHER): Payer: Self-pay | Admitting: *Deleted

## 2017-07-11 NOTE — Telephone Encounter (Signed)
Patient verified DOB Patient is aware of BV/yeast being negative and LDL being slightly elevated. Patient advised to limit saturated fat, fatty food intake and increase physical exercise. No further questions at this time.

## 2017-07-11 NOTE — Telephone Encounter (Signed)
-----   Message from Lizbeth BarkMandesia R Hairston, FNP sent at 07/05/2017  1:48 PM EST ----- BV and Yeast were negative. LDL cholesterol slightly elevated.  Start eating a diet lower in saturated fat. Limit your intake of fried foods, red meats, and whole milk. Increase physical activity.

## 2017-07-12 ENCOUNTER — Ambulatory Visit: Payer: Medicare Other | Admitting: Family Medicine

## 2017-09-06 ENCOUNTER — Other Ambulatory Visit (HOSPITAL_COMMUNITY)
Admission: RE | Admit: 2017-09-06 | Discharge: 2017-09-06 | Disposition: A | Discharge status: Home / Self Care | Payer: Medicare Other | Source: Ambulatory Visit | Attending: Family Medicine | Admitting: Family Medicine

## 2017-09-06 ENCOUNTER — Ambulatory Visit: Payer: Medicare Other

## 2017-09-06 DIAGNOSIS — N898 Other specified noninflammatory disorders of vagina: Secondary | ICD-10-CM

## 2017-09-06 NOTE — Progress Notes (Signed)
Pt presented to the office for a self swab. Pt states that she has been having white and thick discharge with burning and irritation in her vagina x 1 week. Pt self swabbed and specimen was sent to lab. Informed pt that we will call her and send medication in if needed when results come back. Pt verbalized understanding and had no questions.

## 2017-09-07 LAB — CERVICOVAGINAL ANCILLARY ONLY
Bacterial vaginitis: NEGATIVE
CANDIDA VAGINITIS: POSITIVE — AB

## 2017-09-12 ENCOUNTER — Telehealth: Payer: Self-pay | Admitting: *Deleted

## 2017-09-12 DIAGNOSIS — B379 Candidiasis, unspecified: Secondary | ICD-10-CM

## 2017-09-12 MED ORDER — FLUCONAZOLE 150 MG PO TABS: 150.0000 mg | ORAL_TABLET | Freq: Once | ORAL | 0 refills | Status: AC

## 2017-09-12 NOTE — Telephone Encounter (Signed)
Pt called front desk to find out about results of self swab. Pt given results. Verified pharmacy, sent diflucan to pharmacy.

## 2017-09-17 NOTE — Progress Notes (Signed)
I was available at the time of encounter.  Agree with nursing documentation.  Raynelle FanningJulie P. Toris Laverdiere, MD OB Fellow

## 2017-09-22 ENCOUNTER — Ambulatory Visit: Payer: Medicare Other | Admitting: Nurse Practitioner

## 2017-09-23 ENCOUNTER — Encounter: Payer: Medicare Other | Admitting: Nurse Practitioner

## 2017-09-28 ENCOUNTER — Encounter: Payer: Self-pay | Admitting: Family Medicine

## 2017-09-28 ENCOUNTER — Ambulatory Visit: Payer: Medicare Other | Attending: Nurse Practitioner | Admitting: Family Medicine

## 2017-09-28 VITALS — BP 109/73 | HR 94 | Temp 97.9°F | Ht 64.0 in | Wt 137.4 lb

## 2017-09-28 DIAGNOSIS — I10 Essential (primary) hypertension: Secondary | ICD-10-CM | POA: Diagnosis not present

## 2017-09-28 DIAGNOSIS — M545 Low back pain: Secondary | ICD-10-CM

## 2017-09-28 DIAGNOSIS — J439 Emphysema, unspecified: Secondary | ICD-10-CM

## 2017-09-28 DIAGNOSIS — B852 Pediculosis, unspecified: Secondary | ICD-10-CM

## 2017-09-28 DIAGNOSIS — F419 Anxiety disorder, unspecified: Secondary | ICD-10-CM | POA: Diagnosis not present

## 2017-09-28 DIAGNOSIS — G8929 Other chronic pain: Secondary | ICD-10-CM | POA: Diagnosis not present

## 2017-09-28 DIAGNOSIS — Z9049 Acquired absence of other specified parts of digestive tract: Secondary | ICD-10-CM | POA: Diagnosis not present

## 2017-09-28 DIAGNOSIS — K439 Ventral hernia without obstruction or gangrene: Secondary | ICD-10-CM | POA: Insufficient documentation

## 2017-09-28 DIAGNOSIS — Z79899 Other long term (current) drug therapy: Secondary | ICD-10-CM | POA: Diagnosis not present

## 2017-09-28 MED ORDER — ALBUTEROL SULFATE HFA 108 (90 BASE) MCG/ACT IN AERS: 2.0000 | INHALATION_SPRAY | Freq: Four times a day (QID) | RESPIRATORY_TRACT | 3 refills | Status: DC | PRN

## 2017-09-28 MED ORDER — PERMETHRIN 5 % EX CREA: 1.0000 "application " | TOPICAL_CREAM | Freq: Once | CUTANEOUS | 1 refills | Status: AC

## 2017-09-28 MED ORDER — TIOTROPIUM BROMIDE MONOHYDRATE 18 MCG IN CAPS: 18.0000 ug | ORAL_CAPSULE | Freq: Every day | RESPIRATORY_TRACT | 3 refills | Status: DC

## 2017-09-28 MED ORDER — TIOTROPIUM BROMIDE MONOHYDRATE 18 MCG IN CAPS
18.0000 ug | ORAL_CAPSULE | Freq: Every day | RESPIRATORY_TRACT | 3 refills | Status: DC
Start: 2017-09-28 — End: 2017-11-22

## 2017-09-28 MED ORDER — MOMETASONE FURO-FORMOTEROL FUM 200-5 MCG/ACT IN AERO: 2.0000 | INHALATION_SPRAY | Freq: Two times a day (BID) | RESPIRATORY_TRACT | 5 refills | Status: DC

## 2017-09-28 MED ORDER — LISINOPRIL 5 MG PO TABS: 5.0000 mg | ORAL_TABLET | Freq: Every day | ORAL | 5 refills | Status: DC

## 2017-09-28 MED ORDER — METHOCARBAMOL 500 MG PO TABS: 1000.0000 mg | ORAL_TABLET | Freq: Two times a day (BID) | ORAL | 1 refills | Status: DC | PRN

## 2017-09-28 NOTE — Patient Instructions (Signed)
Lice, Adult Lice are tiny insects with claws on the ends of their legs. They are small parasites that live on the human body. A parasite is an insect that lives off another animal and cannot survive without it. Lice often make their home in a person's hair, such as hair on the head or in the pubic area. Pubic lice are sometimes referred to as crabs. Lice hatch from little round eggs, which are attached to the base of hairs. Lice eggs are also called nits. Lice can spread from one person to another. Lice crawl. They do not fly or jump. Lice cause skin irritation and itching in the area of the infested hair. Although having lice can be annoying, it is not dangerous. Lice do not spread diseases. Treatment will usually clear up the symptoms within a few days. What are the causes? This condition may be caused by:  Having very close contact with an infested person.  Sharing infested items that touch your skin and hair. These include personal items, such as hats, combs, brushes, towels, clothing, pillowcases, or sheets.  Pubic lice are spread through sexual contact. What increases the risk? Although having lice is more common among young children, anyone can get lice. Lice tend to thrive in warm weather, so that type of weather increases the risk. What are the signs or symptoms? Symptoms of this condition include:  Itchiness in the affected area.  Skin irritation.  Feeling of something moving in the hair.  Rash or sores on the skin.  Tiny flakes or sacs near the scalp. These may be white, yellow, or tan.  Tiny bugs crawling on the hair or scalp.  How is this diagnosed? This condition is diagnosed based on:  Your symptoms.  A physical exam. ? Your health care provider will examine the affected area closely for live lice, tiny eggs (nits), and empty egg cases. ? Eggs are typically yellow or tan in color. Empty egg cases are whitish. Lice are gray or brown.  How is this treated? Treatment  for this condition includes:  Using a hair rinse that contains a mild insecticide to kill lice. Your health care provider will recommend a prescription or over-the-counter rinse.  Removing lice, eggs, and egg cases by using a comb or tweezers.  Washing and bagging your clothing and bedding.  Pregnant women should not use medicated shampoo or cream without first talking to their health care provider. Follow these instructions at home: Using medicated rinse Apply medicated rinse as told by your health care provider. Follow the label instructions carefully. General instructions for applying rinses may include these steps: 1. Put on an old shirt or underwear, or use an old towel in case of staining from the rinse. 2. Wash and towel-dry your head or pubic area before applying the rinse if directed to do so. 3. When your hair is dry, apply the rinse. Leave the rinse in your hair for the amount of time specified in the instructions. 4. Rinse the area with water. 5. Comb your wet hair with a fine-tooth comb. Comb it close to the skin and down to the ends, removing any lice, eggs, or egg cases. A lice comb may be included with the medicated rinse. 6. Do not wash the infested hair for 2 days while the medicine kills the lice. 7. After the treatment, repeat combing out your hair and removing lice, eggs, or egg cases from the hair every 2-3 days. Do this for about 2-3 weeks. After treatment, the  remaining lice should be moving more slowly. 8. Repeat the treatment if necessary in 7-10 days.  General instructions  Remove any remaining lice, eggs, or egg cases using a fine-tooth comb.  Use hot water to wash all towels, hats, scarves, jackets, bedding, and clothing that you have recently used.  Put any non-washable items that may have been exposed into plastic bags. Keep the bags closed for 2 weeks.  Soak all combs and brushes in hot water for 10 minutes.  Vacuum furniture to remove any loose hair.  There is no need to use chemicals, which can be poisonous (toxic). Lice survive for only 1-2 days away from human skin. Eggs may survive for only 1 week.  For pubic lice, tell any sexual partners to seek treatment.  For head lice, ask your health care provider if other family members or close contacts should be examined or treated as well.  Keep all follow-up visits as told by your health care provider. This is important. Contact a health care provider if:  You develop sores that look infected.  Your rash or sores do not go away in 1 week.  The lice or eggs return or do not go away in spite of treatment. Summary  Lice are tiny parasitic insects that live on the human body. A parasite is an insect that lives off another animal and cannot survive without it.  Lice can spread from one person to another through close contact with an infested person or by sharing personal items, such as combs, brushes, or hats.  Lice can be treated with a medicated rinse. Follow your health care provider's instructions, or instructions on the label, if you are being treated with this medicine.  Ask your health care provider if your family members or close contacts should be treated for lice. This information is not intended to replace advice given to you by your health care provider. Make sure you discuss any questions you have with your health care provider. Document Released: 05/31/2005 Document Revised: 06/09/2016 Document Reviewed: 06/09/2016 Elsevier Interactive Patient Education  2017 [employer]  

## 2017-09-28 NOTE — Progress Notes (Signed)
Subjective:  Patient ID: Maria Oliver, female    DOB: 22-Oct-1960  Age: 57 y.o. MRN: [id number]  CC: Hypertension and Follow-up   HPI Maria Oliver is a 57 year old female with a history of hypertension, COPD, chronic low back pain who presents today to get established with me. She complains of a pruritic rash all over her body and informs me she is "fighting bedbugs and head lice" and has called an exterminator to help with the process.  She has used OTC regimens with no improvement in symptoms and is requesting a referral to [facility] or [facility] so she can get new furniture and bedding.  Her COPD is stable but she informs me symptoms get worse over the summer; she has been compliant with her inhalers; denies wheezing, dyspnea or shortness of breath. PFTs from 2016 revealed FEV1 of 63% of predicted, FEV1/FVC of 83% of predicted; mild obstructive airway disease, reversible, moderately severe diffusion defect. She takes a muscle relaxant for chronic low back pain which does not radiate down her lower extremities and is reported as controlled at this time. Previously took Paxil for anxiety but states this never helped her and she would like to go to [facility] to schedule an appointment.  Denies suicidal ideations or intent. Her blood pressure is slightly elevated; she discontinued losartan due to concerns about its recall.  Past Medical History:  Diagnosis Date  . Abdominal pain   . Anxiety   . Arthritis   . Asthma   . Chronic cholecystitis with calculus s/p lap cholecystectomy 09/09/2016 09/09/2016  . Complication of anesthesia    slow to arouse post operatively   . COPD (chronic obstructive pulmonary disease) (HCC)   . 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 Generalized headaches   . Hernia, inguinal, right   . Hypertension   . Leg swelling     Past Surgical History:  Procedure Laterality Date  . COLON SURGERY  2010   per patient "colon  take down"  . COLOSTOMY  2010  . LAPAROSCOPIC CHOLECYSTECTOMY SINGLE SITE WITH INTRAOPERATIVE CHOLANGIOGRAM N/A 09/09/2016   Procedure: LAPAROSCOPIC CHOLECYSTECTOMY  WITH INTRAOPERATIVE CHOLANGIOGRAM;  Surgeon: Karie SodaSteven Gross, MD;  Location: [facility];  Service: General;  Laterality: N/A;  . TUBAL LIGATION      Allergies  Allergen Reactions  . Aspirin Other (See Comments)    REACTION: GI upset/Hernia   . Ciprofloxacin Other (See Comments)    Tendonitis, joint pain, and nausea.  "Interfered with her HCTZ" (also)  . Codeine Nausea And Vomiting and Other (See Comments)    (Also) GI upset/Hernia (CAN TOLERATE DILAUDID & MORPHINE)  . Eggs Or Egg-Derived Products Nausea Only    Stomach pains  . Fentanyl Nausea Only    Tolerates Dilaudid or morphine much better  . Hydrocodone Nausea And Vomiting  . Lactose Intolerance (Gi) Nausea And Vomiting and Other (See Comments)    (Also) vertigo  . Oxycodone Nausea And Vomiting    Tolerates hydromorphone better  . Penicillins Rash    Has patient had a PCN reaction causing immediate rash, facial/tongue/throat swelling, SOB or lightheadedness with hypotension: Yes Has patient had a PCN reaction causing severe rash involving mucus membranes or skin necrosis: No Has patient had a PCN reaction that required hospitalization No Has patient had a PCN reaction occurring within the last 10 years: No If all of the above answers are "NO", then may proceed with Cephalosporin use.      Outpatient Medications  Prior to Visit  Medication Sig Dispense Refill  . VENTOLIN HFA 108 (90 Base) MCG/ACT inhaler INHALE 2 PUFFS BY MOUTH EVERY 6 HOURS AS NEEDED FOR SHORTNESS OF BREATH OR WHEEZING 18 g 0  . cholecalciferol (VITAMIN D) 1000 units tablet Take 1,000 Units by mouth every other day.     . ibuprofen (ADVIL,MOTRIN) 600 MG tablet Take 1 tablet (600 mg total) by mouth every 6 (six) hours as needed for moderate pain. (Patient not taking: Reported on 06/01/2017) 30 tablet 0    . Multiple Vitamin (MULTIVITAMIN WITH MINERALS) TABS tablet Take 1 tablet by mouth every other day.    Marland Kitchen PARoxetine (PAXIL) 30 MG tablet Take 1 tablet (30 mg total) by mouth daily. (Patient not taking: Reported on 09/06/2017) 30 tablet 3  . sulfamethoxazole-trimethoprim (BACTRIM DS) 800-160 MG tablet Take 1 tablet by mouth 2 (two) times daily. (Patient not taking: Reported on 09/06/2017) 14 tablet 0  . triamcinolone ointment (KENALOG) 0.5 % Apply 1 application topically 2 (two) times daily. Apply to back rash (Patient not taking: Reported on 06/01/2017) 60 g 0  . losartan (COZAAR) 50 MG tablet Take 1 tablet (50 mg total) by mouth daily. (Patient not taking: Reported on 09/06/2017) 30 tablet 2  . methocarbamol (ROBAXIN) 500 MG tablet Take 2 tablets (1,000 mg total) by mouth every 8 (eight) hours as needed for muscle spasms. (Patient not taking: Reported on 06/01/2017) 30 tablet 0  . mometasone-formoterol (DULERA) 200-5 MCG/ACT AERO Inhale 2 puffs into the lungs 2 (two) times daily. (Patient not taking: Reported on 06/01/2017) 1 Inhaler 5  . tiotropium (SPIRIVA HANDIHALER) 18 MCG inhalation capsule Place 1 capsule (18 mcg total) into inhaler and inhale daily. (Patient not taking: Reported on 09/28/2017) 90 capsule 3   No facility-administered medications prior to visit.     ROS Review of Systems  Constitutional: Negative for activity change, appetite change and fatigue.  HENT: Negative for congestion, sinus pressure and sore throat.   Eyes: Negative for visual disturbance.  Respiratory: Negative for cough, chest tightness, shortness of breath and wheezing.   Cardiovascular: Negative for chest pain and palpitations.  Gastrointestinal: Negative for abdominal distention, abdominal pain and constipation.  Endocrine: Negative for polydipsia.  Genitourinary: Negative for dysuria and frequency.  Musculoskeletal: Negative for arthralgias and back pain.  Skin: Negative for rash.  Neurological: Negative  for tremors, light-headedness and numbness.  Hematological: Does not bruise/bleed easily.  Psychiatric/Behavioral: Negative for agitation and behavioral problems.    Objective:  BP 109/73   Pulse 94   Temp 97.9 F (36.6 C) (Oral)   Ht 5\' 4"  (1.626 m)   Wt 137 lb 6.4 oz (62.3 kg)   SpO2 97%   BMI 23.58 kg/m   BP/Weight 09/28/2017 06/28/2017 06/01/2017  Systolic BP 109 148 119  Diastolic BP 73 87 78  Wt. (Lbs) 137.4 141 141.2  BMI 23.58 24.2 24.24      Physical Exam  Constitutional: She is oriented to person, place, and time. She appears well-developed and well-nourished.  Cardiovascular: Normal rate, normal heart sounds and intact distal pulses.  No murmur heard. Pulmonary/Chest: Effort normal and breath sounds normal. She has no wheezes. She has no rales. She exhibits no tenderness.  Abdominal: Soft. Bowel sounds are normal. She exhibits mass (right sided abdominal hernia, not TTP). She exhibits no distension. There is no tenderness.  Musculoskeletal: Normal range of motion.  Neurological: She is alert and oriented to person, place, and time.  Skin:  Generalized erythematous rash  with scratch marks in different stages of healing  Psychiatric: She has a normal mood and affect.     Assessment & Plan:   1. Pulmonary emphysema, unspecified emphysema type (HCC) Stable No acute flare - albuterol (VENTOLIN HFA) 108 (90 Base) MCG/ACT inhaler; Inhale 2 puffs into the lungs every 6 (six) hours as needed for wheezing or shortness of breath.  Dispense: 18 g; Refill: 3 - mometasone-formoterol (DULERA) 200-5 MCG/ACT AERO; Inhale 2 puffs into the lungs 2 (two) times daily.  Dispense: 1 Inhaler; Refill: 5 - tiotropium (SPIRIVA HANDIHALER) 18 MCG inhalation capsule; Place 1 capsule (18 mcg total) into inhaler and inhale daily.  Dispense: 90 capsule; Refill: 3  2. Essential hypertension Slightly elevated No regimen change today as blood pressure at last visit was normal. She  discontinued losartan due to concerns about recalls and I have substituted with lisinopril. Low sodium, DASH diet - lisinopril (PRINIVIL,ZESTRIL) 5 MG tablet; Take 1 tablet (5 mg total) by mouth daily.  Dispense: 30 tablet; Refill: 5  3. Lice Patient is working on getting an Management consultant called in to discuss community resources for obtaining new furniture - permethrin (ELIMITE) 5 % cream; Apply 1 application topically once for 1 dose.  Dispense: 60 g; Refill: 1  4. Anxiety Previously on Paxil which she do not do well on She will be scheduling an appointment with [facility]  5. Ventral hernia without obstruction or gangrene No surgical intervention at this time as per general surgery  6. Chronic bilateral low back pain without sciatica Stable - methocarbamol (ROBAXIN) 500 MG tablet; Take 2 tablets (1,000 mg total) by mouth every 12 (twelve) hours as needed for muscle spasms.  Dispense: 60 tablet; Refill: 1   Meds ordered this encounter  Medications  . permethrin (ELIMITE) 5 % cream    Sig: Apply 1 application topically once for 1 dose.    Dispense:  60 g    Refill:  1  . albuterol (VENTOLIN HFA) 108 (90 Base) MCG/ACT inhaler    Sig: Inhale 2 puffs into the lungs every 6 (six) hours as needed for wheezing or shortness of breath.    Dispense:  18 g    Refill:  3  . mometasone-formoterol (DULERA) 200-5 MCG/ACT AERO    Sig: Inhale 2 puffs into the lungs 2 (two) times daily.    Dispense:  1 Inhaler    Refill:  5  . tiotropium (SPIRIVA HANDIHALER) 18 MCG inhalation capsule    Sig: Place 1 capsule (18 mcg total) into inhaler and inhale daily.    Dispense:  90 capsule    Refill:  3  . lisinopril (PRINIVIL,ZESTRIL) 5 MG tablet    Sig: Take 1 tablet (5 mg total) by mouth daily.    Dispense:  30 tablet    Refill:  5    Discontinue losartan  . methocarbamol (ROBAXIN) 500 MG tablet    Sig: Take 2 tablets (1,000 mg total) by mouth every 12 (twelve) hours as needed for muscle spasms.     Dispense:  60 tablet    Refill:  1    Follow-up: Return in about 3 weeks (around 10/19/2017) for complete physical exam.   Hoy Register MD

## 2017-10-12 ENCOUNTER — Emergency Department (HOSPITAL_COMMUNITY): Payer: Medicare Other

## 2017-10-12 ENCOUNTER — Other Ambulatory Visit: Payer: Self-pay

## 2017-10-12 ENCOUNTER — Encounter (HOSPITAL_COMMUNITY): Payer: Self-pay

## 2017-10-12 ENCOUNTER — Emergency Department (HOSPITAL_COMMUNITY)
Admission: EM | Admit: 2017-10-12 | Discharge: 2017-10-12 | Disposition: A | Discharge status: Home / Self Care | Payer: Medicare Other | Attending: Emergency Medicine | Admitting: Emergency Medicine

## 2017-10-12 DIAGNOSIS — J449 Chronic obstructive pulmonary disease, unspecified: Secondary | ICD-10-CM | POA: Insufficient documentation

## 2017-10-12 DIAGNOSIS — J069 Acute upper respiratory infection, unspecified: Secondary | ICD-10-CM | POA: Diagnosis not present

## 2017-10-12 DIAGNOSIS — F17210 Nicotine dependence, cigarettes, uncomplicated: Secondary | ICD-10-CM | POA: Diagnosis not present

## 2017-10-12 DIAGNOSIS — Z79899 Other long term (current) drug therapy: Secondary | ICD-10-CM | POA: Insufficient documentation

## 2017-10-12 DIAGNOSIS — I10 Essential (primary) hypertension: Secondary | ICD-10-CM | POA: Diagnosis not present

## 2017-10-12 DIAGNOSIS — R0981 Nasal congestion: Secondary | ICD-10-CM | POA: Diagnosis present

## 2017-10-12 LAB — INFLUENZA PANEL BY PCR (TYPE A & B)
Influenza A By PCR: NEGATIVE
Influenza B By PCR: NEGATIVE

## 2017-10-12 MED ORDER — AZITHROMYCIN 250 MG PO TABS: 250.0000 mg | ORAL_TABLET | Freq: Every day | ORAL | 0 refills | Status: DC

## 2017-10-12 MED ORDER — OSELTAMIVIR PHOSPHATE 75 MG PO CAPS: 75.0000 mg | ORAL_CAPSULE | Freq: Two times a day (BID) | ORAL | 0 refills | Status: DC

## 2017-10-12 NOTE — ED Triage Notes (Signed)
Pt endorses nasal/chest congestion with headaches x 2 days with bodyaches. Pt has copd. VSS.

## 2017-10-12 NOTE — ED Provider Notes (Signed)
[facility] EMERGENCY DEPARTMENT Provider Note   CSN: [id number] Arrival date & time: 10/12/17  1803     History   Chief Complaint Chief Complaint  Patient presents with  . Nasal Congestion  . URI    HPI Maria Oliver is a 57 y.o. female.  HPI   57 year old female presents today with upper respiratory complaints.  Patient notes 3 days of nasal congestion, rhinorrhea, frontal headache, body ache and cough.  She notes symptoms originally started in the sinuses and have progressed towards the chest.  Patient denies any significant shortness of breath, she notes using her albuterol at home as needed.  She denies any purulent or bloody discharge from the nose.  She does note a history of COPD and pneumonia approximately 1 year ago.  Patient did not receive influenza vaccine this year.  She currently smokes.    Past Medical History:  Diagnosis Date  . Abdominal pain   . Anxiety   . Arthritis   . Asthma   . Chronic cholecystitis with calculus s/p lap cholecystectomy 09/09/2016 09/09/2016  . Complication of anesthesia    slow to arouse post operatively   . COPD (chronic obstructive pulmonary disease) (HCC)   . 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 Generalized headaches   . Hernia, inguinal, right   . Hypertension   . Leg swelling     Patient Active Problem List   Diagnosis Date Noted  . 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 Pruritic rash 10/14/2016  . Chronic narcotic use 09/09/2016  . Onychomycosis of right great toe 10/13/2015  . Osteoarthritis 08/11/2015  . Chronic abdominal pain 05/22/2015  . Chronic pain syndrome 05/22/2015  . Vitamin D insufficiency 02/06/2015  . Chronic knee pain 02/06/2015  . Ventral hernia without obstruction or gangrene 12/31/2014  . Anxiety 12/31/2014  . COPD (chronic obstructive pulmonary disease) with emphysema (HCC)   . Tobacco abuse 10/01/2009  . Essential  hypertension 10/01/2009    Past Surgical History:  Procedure Laterality Date  . COLON SURGERY  2010   per patient "colon take down"  . COLOSTOMY  2010  . LAPAROSCOPIC CHOLECYSTECTOMY SINGLE SITE WITH INTRAOPERATIVE CHOLANGIOGRAM N/A 09/09/2016   Procedure: LAPAROSCOPIC CHOLECYSTECTOMY  WITH INTRAOPERATIVE CHOLANGIOGRAM;  Surgeon: Karie Soda, MD;  Location: [facility];  Service: General;  Laterality: N/A;  . TUBAL LIGATION       OB History   None      Home Medications    Prior to Admission medications   Medication Sig Start Date End Date Taking? Authorizing Provider  albuterol (VENTOLIN HFA) 108 (90 Base) MCG/ACT inhaler Inhale 2 puffs into the lungs every 6 (six) hours as needed for wheezing or shortness of breath. 09/28/17   Hoy Register, MD  azithromycin (ZITHROMAX) 250 MG tablet Take 1 tablet (250 mg total) by mouth daily. Take first 2 tablets together, then 1 every day until finished. 10/12/17   Shanya Ferriss, Tinnie Gens, PA-C  cholecalciferol (VITAMIN D) 1000 units tablet Take 1,000 Units by mouth every other day.     [provider]  ibuprofen (ADVIL,MOTRIN) 600 MG tablet Take 1 tablet (600 mg total) by mouth every 6 (six) hours as needed for moderate pain. Patient not taking: Reported on 06/01/2017 09/02/16   Loren Racer, MD  lisinopril (PRINIVIL,ZESTRIL) 5 MG tablet Take 1 tablet (5 mg total) by mouth daily. 09/28/17   Hoy Register, MD  methocarbamol (ROBAXIN) 500 MG tablet Take 2 tablets (1,000  mg total) by mouth every 12 (twelve) hours as needed for muscle spasms. 09/28/17   Hoy Register, MD  mometasone-formoterol (DULERA) 200-5 MCG/ACT AERO Inhale 2 puffs into the lungs 2 (two) times daily. 09/28/17   Hoy Register, MD  Multiple Vitamin (MULTIVITAMIN WITH MINERALS) TABS tablet Take 1 tablet by mouth every other day.    [provider]  oseltamivir (TAMIFLU) 75 MG capsule Take 1 capsule (75 mg total) by mouth every 12 (twelve) hours. 10/12/17   Nazanin Kinner, Tinnie Gens,  PA-C  PARoxetine (PAXIL) 30 MG tablet Take 1 tablet (30 mg total) by mouth daily. Patient not taking: Reported on 09/06/2017 06/01/17   Anders Simmonds, PA-C  sulfamethoxazole-trimethoprim (BACTRIM DS) 800-160 MG tablet Take 1 tablet by mouth 2 (two) times daily. Patient not taking: Reported on 09/06/2017 06/28/17   Lizbeth Bark, FNP  tiotropium (SPIRIVA HANDIHALER) 18 MCG inhalation capsule Place 1 capsule (18 mcg total) into inhaler and inhale daily. 09/28/17   Hoy Register, MD  triamcinolone ointment (KENALOG) 0.5 % Apply 1 application topically 2 (two) times daily. Apply to back rash Patient not taking: Reported on 06/01/2017 10/14/16   Dessa Phi, MD    Family History Family History  Problem Relation Age of Onset  . Heart disease Mother 8  . Heart failure Mother   . Asthma Mother   . Emphysema Mother   . Alzheimer's disease Father 17  . Emphysema Brother   . Asthma Brother   . Colon cancer Neg Hx     Social History Social History   Tobacco Use  . Smoking status: Current Every Day Smoker    Packs/day: 0.50    Years: 40.00    Pack years: 20.00    Types: Cigarettes  . Smokeless tobacco: Never Used  . Tobacco comment: Recently quit, started back to 4 cigs/daily  Substance Use Topics  . Alcohol use: No    Alcohol/week: 0.0 oz  . Drug use: No     Allergies   Aspirin; Ciprofloxacin; Codeine; Eggs or egg-derived products; Fentanyl; Hydrocodone; Lactose intolerance (gi); Oxycodone; and Penicillins   Review of Systems Review of Systems  All other systems reviewed and are negative.    Physical Exam Updated Vital Signs BP (!) 150/85 (BP Location: Right Arm)   Pulse 64   Temp 98.1 F (36.7 C) (Oral)   Resp 16   Ht  (1.626 m)   Wt 63.5 kg (140 lb)   SpO2 99%   BMI 24.03 kg/m   Physical Exam  Constitutional: She is oriented to person, place, and time. She appears well-developed and well-nourished.  HENT:  Head: Normocephalic and atraumatic.    Rhinorrhea   Eyes: Pupils are equal, round, and reactive to light. Conjunctivae are normal. Right eye exhibits no discharge. Left eye exhibits no discharge. No scleral icterus.  Neck: Normal range of motion. No JVD present. No tracheal deviation present.  Pulmonary/Chest: Effort normal and breath sounds normal. No stridor. No respiratory distress. She has no wheezes. She has no rales. She exhibits no tenderness.  Neurological: She is alert and oriented to person, place, and time. Coordination normal.  Skin: Skin is warm.  Psychiatric: She has a normal mood and affect. Her behavior is normal. Judgment and thought content normal.  Nursing note and vitals reviewed.   ED Treatments / Results  Labs (all labs ordered are listed, but only abnormal results are displayed) Labs Reviewed  INFLUENZA PANEL BY PCR (TYPE A & B)    EKG None  Radiology Dg Chest 2 View  Result Date: 10/12/2017 CLINICAL DATA:  57 year old female with a history of chest congestion and headache EXAM: CHEST - 2 VIEW COMPARISON:  03/18/2016 FINDINGS: Cardiomediastinal silhouette unchanged in size and contour. No evidence of central vascular congestion. No pneumothorax or pleural effusion. No confluent airspace disease. No acute displaced fracture IMPRESSION: Negative for acute cardiopulmonary disease Electronically Signed   By: Gilmer Mor D.O.   On: 10/12/2017 19:20    Procedures Procedures (including critical care time)  Medications Ordered in ED Medications - No data to display   Initial Impression / Assessment and Plan / ED Course  I have reviewed the triage vital signs and the nursing notes.  Pertinent labs & imaging results that were available during my care of the patient were reviewed by me and considered in my medical decision making (see chart for details).      Final Clinical Impressions(s) / ED Diagnoses   Final diagnoses:  Upper respiratory tract infection, unspecified type   Labs:  Influenza  Imaging:  Consults:  Therapeutics:  Discharge Meds: Tamiflu, azithromycin  Assessment/Plan: 57 year old female presents today with likely viral URI.  She has ongoing rhinorrhea and nonproductive cough.  She has clear lung sounds reassuring vital signs with normal oxygenation, afebrile and a negative chest x-ray.  I do have low suspicion for pneumonia.  With out fever I have low suspicion for influenza, but given patient's pulmonary history influenza panel will be sent.  I will send her home with a prescription for Tamiflu, she will be notified if any positive results.  She will also be sent home with azithromycin in the event symptoms do not improve beyond 5 to 7 days or acutely worsen.  If she has any concerning worsening signs or symptoms she will return to the emergency room immediately for repeat evaluation.  Patient verbalized understanding and agreement to today's plan had no further questions or concerns.      ED Discharge Orders        Ordered    azithromycin (ZITHROMAX) 250 MG tablet  Daily,   Status:  Discontinued     10/12/17 2050    oseltamivir (TAMIFLU) 75 MG capsule  Every 12 hours,   Status:  Discontinued     10/12/17 2050    azithromycin (ZITHROMAX) 250 MG tablet  Daily,   Status:  Discontinued     10/12/17 2054    oseltamivir (TAMIFLU) 75 MG capsule  Every 12 hours,   Status:  Discontinued     10/12/17 2054    oseltamivir (TAMIFLU) 75 MG capsule  Every 12 hours,   Status:  Discontinued     10/12/17 2055    azithromycin (ZITHROMAX) 250 MG tablet  Daily,   Status:  Discontinued     10/12/17 2055    oseltamivir (TAMIFLU) 75 MG capsule  Every 12 hours     10/12/17 2057    azithromycin (ZITHROMAX) 250 MG tablet  Daily     10/12/17 2057       Eyvonne Mechanic, PA-C 10/12/17 [id number]    Nira Conn, MD 10/13/17 1334

## 2017-10-12 NOTE — Discharge Instructions (Addendum)
Please read attached information. If you experience any new or worsening signs or symptoms please return to the emergency room for evaluation. Please follow-up with your primary care provider or specialist as discussed. Please use medication prescribed only as directed and discontinue taking if you have any concerning signs or symptoms.   °

## 2017-11-02 ENCOUNTER — Ambulatory Visit: Payer: Medicare Other | Attending: Family Medicine | Admitting: Family Medicine

## 2017-11-02 ENCOUNTER — Encounter: Payer: Self-pay | Admitting: Family Medicine

## 2017-11-02 ENCOUNTER — Other Ambulatory Visit (HOSPITAL_COMMUNITY)
Admission: RE | Admit: 2017-11-02 | Discharge: 2017-11-02 | Disposition: A | Discharge status: Home / Self Care | Payer: Medicare Other | Source: Ambulatory Visit | Attending: Family Medicine | Admitting: Family Medicine

## 2017-11-02 ENCOUNTER — Other Ambulatory Visit: Payer: Self-pay | Admitting: Family Medicine

## 2017-11-02 VITALS — BP 152/82 | HR 69 | Temp 98.1°F | Ht 64.0 in | Wt 137.4 lb

## 2017-11-02 DIAGNOSIS — Z881 Allergy status to other antibiotic agents status: Secondary | ICD-10-CM | POA: Insufficient documentation

## 2017-11-02 DIAGNOSIS — J449 Chronic obstructive pulmonary disease, unspecified: Secondary | ICD-10-CM | POA: Insufficient documentation

## 2017-11-02 DIAGNOSIS — Z0000 Encounter for general adult medical examination without abnormal findings: Secondary | ICD-10-CM

## 2017-11-02 DIAGNOSIS — Z885 Allergy status to narcotic agent status: Secondary | ICD-10-CM | POA: Insufficient documentation

## 2017-11-02 DIAGNOSIS — M7989 Other specified soft tissue disorders: Secondary | ICD-10-CM | POA: Diagnosis not present

## 2017-11-02 DIAGNOSIS — Z79899 Other long term (current) drug therapy: Secondary | ICD-10-CM | POA: Insufficient documentation

## 2017-11-02 DIAGNOSIS — Z1231 Encounter for screening mammogram for malignant neoplasm of breast: Secondary | ICD-10-CM

## 2017-11-02 DIAGNOSIS — Z124 Encounter for screening for malignant neoplasm of cervix: Secondary | ICD-10-CM

## 2017-11-02 DIAGNOSIS — F419 Anxiety disorder, unspecified: Secondary | ICD-10-CM | POA: Insufficient documentation

## 2017-11-02 DIAGNOSIS — I10 Essential (primary) hypertension: Secondary | ICD-10-CM | POA: Diagnosis not present

## 2017-11-02 DIAGNOSIS — Z886 Allergy status to analgesic agent status: Secondary | ICD-10-CM | POA: Insufficient documentation

## 2017-11-02 DIAGNOSIS — Z1239 Encounter for other screening for malignant neoplasm of breast: Secondary | ICD-10-CM

## 2017-11-02 MED ORDER — PERMETHRIN 5 % EX CREA: 1.0000 "application " | TOPICAL_CREAM | Freq: Once | CUTANEOUS | 0 refills | Status: DC

## 2017-11-02 MED ORDER — PERMETHRIN 5 % EX CREA: 1.0000 "application " | TOPICAL_CREAM | Freq: Once | CUTANEOUS | 1 refills | Status: AC

## 2017-11-02 NOTE — Patient Instructions (Signed)

## 2017-11-02 NOTE — Progress Notes (Signed)
Subjective:  Patient ID: Maria Oliver, female    DOB: [birth year]  Age: 57 y.o. MRN: [id number]  CC: Annual Exam and Gynecologic Exam   HPI Maria Oliver is here for complete physical exam. She had polyps removed from the last colonoscopy in 2010 and recommendation was for repeat in 3 years but epic reveals she is due in 2020. Last Pap smear was in 12/2013 and mammogram in 08/2015  Past Medical History:  Diagnosis Date  . Abdominal pain   . Anxiety   . Arthritis   . Asthma   . Chronic cholecystitis with calculus s/p lap cholecystectomy 09/09/2016 09/09/2016  . Complication of anesthesia    slow to arouse post operatively   . COPD (chronic obstructive pulmonary disease) (HCC)   . 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 Generalized headaches   . Hernia, inguinal, right   . Hypertension   . Leg swelling     Past Surgical History:  Procedure Laterality Date  . COLON SURGERY  2010   per patient "colon take down"  . COLOSTOMY  2010  . LAPAROSCOPIC CHOLECYSTECTOMY SINGLE SITE WITH INTRAOPERATIVE CHOLANGIOGRAM N/A 09/09/2016   Procedure: LAPAROSCOPIC CHOLECYSTECTOMY  WITH INTRAOPERATIVE CHOLANGIOGRAM;  Surgeon: Karie Soda, MD;  Location: [facility];  Service: General;  Laterality: N/A;  . TUBAL LIGATION      Allergies  Allergen Reactions  . Aspirin Other (See Comments)    REACTION: GI upset/Hernia   . Ciprofloxacin Other (See Comments)    Tendonitis, joint pain, and nausea.  "Interfered with her HCTZ" (also)  . Codeine Nausea And Vomiting and Other (See Comments)    (Also) GI upset/Hernia (CAN TOLERATE DILAUDID & MORPHINE)  . Eggs Or Egg-Derived Products Nausea Only    Stomach pains  . Fentanyl Nausea Only    Tolerates Dilaudid or morphine much better  . Hydrocodone Nausea And Vomiting  . Lactose Intolerance (Gi) Nausea And Vomiting and Other (See Comments)    (Also) vertigo  . Oxycodone Nausea And Vomiting    Tolerates hydromorphone  better  . Penicillins Rash    Has patient had a PCN reaction causing immediate rash, facial/tongue/throat swelling, SOB or lightheadedness with hypotension: Yes Has patient had a PCN reaction causing severe rash involving mucus membranes or skin necrosis: No Has patient had a PCN reaction that required hospitalization No Has patient had a PCN reaction occurring within the last 10 years: No If all of the above answers are "NO", then may proceed with Cephalosporin use.      Outpatient Medications Prior to Visit  Medication Sig Dispense Refill  . albuterol (VENTOLIN HFA) 108 (90 Base) MCG/ACT inhaler Inhale 2 puffs into the lungs every 6 (six) hours as needed for wheezing or shortness of breath. 18 g 3  . cholecalciferol (VITAMIN D) 1000 units tablet Take 1,000 Units by mouth every other day.     . mometasone-formoterol (DULERA) 200-5 MCG/ACT AERO Inhale 2 puffs into the lungs 2 (two) times daily. 1 Inhaler 5  . Multiple Vitamin (MULTIVITAMIN WITH MINERALS) TABS tablet Take 1 tablet by mouth every other day.    Marland Kitchen azithromycin (ZITHROMAX) 250 MG tablet Take 1 tablet (250 mg total) by mouth daily. Take first 2 tablets together, then 1 every day until finished. (Patient not taking: Reported on 11/02/2017) 6 tablet 0  . ibuprofen (ADVIL,MOTRIN) 600 MG tablet Take 1 tablet (600 mg total) by mouth every 6 (six) hours as needed for moderate pain. (Patient not  taking: Reported on 06/01/2017) 30 tablet 0  . lisinopril (PRINIVIL,ZESTRIL) 5 MG tablet Take 1 tablet (5 mg total) by mouth daily. (Patient not taking: Reported on 11/02/2017) 30 tablet 5  . methocarbamol (ROBAXIN) 500 MG tablet Take 2 tablets (1,000 mg total) by mouth every 12 (twelve) hours as needed for muscle spasms. (Patient not taking: Reported on 11/02/2017) 60 tablet 1  . oseltamivir (TAMIFLU) 75 MG capsule Take 1 capsule (75 mg total) by mouth every 12 (twelve) hours. (Patient not taking: Reported on 11/02/2017) 10 capsule 0  . PARoxetine  (PAXIL) 30 MG tablet Take 1 tablet (30 mg total) by mouth daily. (Patient not taking: Reported on 09/06/2017) 30 tablet 3  . sulfamethoxazole-trimethoprim (BACTRIM DS) 800-160 MG tablet Take 1 tablet by mouth 2 (two) times daily. (Patient not taking: Reported on 09/06/2017) 14 tablet 0  . tiotropium (SPIRIVA HANDIHALER) 18 MCG inhalation capsule Place 1 capsule (18 mcg total) into inhaler and inhale daily. (Patient not taking: Reported on 11/02/2017) 90 capsule 3  . triamcinolone ointment (KENALOG) 0.5 % Apply 1 application topically 2 (two) times daily. Apply to back rash (Patient not taking: Reported on 06/01/2017) 60 g 0   No facility-administered medications prior to visit.     ROS Review of Systems  Constitutional: Negative for activity change, appetite change and fatigue.  HENT: Negative for congestion, sinus pressure and sore throat.   Eyes: Negative for visual disturbance.  Respiratory: Negative for cough, chest tightness, shortness of breath and wheezing.   Cardiovascular: Negative for chest pain and palpitations.  Gastrointestinal: Negative for abdominal distention, abdominal pain and constipation.  Endocrine: Negative for polydipsia.  Genitourinary: Negative for dysuria and frequency.  Musculoskeletal: Negative for arthralgias and back pain.  Skin: Positive for rash.  Neurological: Negative for tremors, light-headedness and numbness.  Hematological: Does not bruise/bleed easily.  Psychiatric/Behavioral: Negative for agitation and behavioral problems.    Objective:  BP (!) 152/82   Pulse 69   Temp 98.1 F (36.7 C) (Oral)   Ht  (1.626 m)   Wt 137 lb 6.4 oz (62.3 kg)   SpO2 98%   BMI 23.58 kg/m   BP/Weight 11/02/2017 10/12/2017 09/28/2017  Systolic BP 152 150 109  Diastolic BP 82 85 73  Wt. (Lbs) 137.4 140 137.4  BMI 23.58 24.03 23.58      Physical Exam  Constitutional: She is oriented to person, place, and time. She appears well-developed and well-nourished. No  distress.  HENT:  Head: Normocephalic.  Right Ear: External ear normal.  Left Ear: External ear normal.  Nose: Nose normal.  Mouth/Throat: Oropharynx is clear and moist.  Eyes: Pupils are equal, round, and reactive to light. Conjunctivae and EOM are normal.  Neck: Normal range of motion. No JVD present.  Cardiovascular: Normal rate, regular rhythm, normal heart sounds and intact distal pulses. Exam reveals no gallop.  No murmur heard. Pulmonary/Chest: Effort normal and breath sounds normal. No respiratory distress. She has no wheezes. She has no rales. She exhibits no tenderness. Right breast exhibits no mass and no tenderness. Left breast exhibits no mass and no tenderness.  Abdominal: Soft. Bowel sounds are normal. She exhibits no distension and no mass. There is no tenderness.  Genitourinary:  Genitourinary Comments: External genitalia, vagina, cervix, adnexa -normal; no CMT  Musculoskeletal: Normal range of motion. She exhibits no edema or tenderness.  Neurological: She is alert and oriented to person, place, and time. She has normal reflexes.  Skin: Skin is warm and dry. She  is not diaphoretic.  Generalized erythematous pin point rash   Psychiatric: She has a normal mood and affect.     Assessment & Plan:   1. Annual physical exam Counseled on 150 minutes of exercise per week, healthy eating (including decreased daily intake of saturated fats, cholesterol, added sugars, sodium), STI prevention, routine healthcare maintenance. Healthcare maintenance tab reveals colonoscopy not due until 02/2019 however notes from last colonoscopy required repeat in 3 years; history of polypectomy.  I will refer for colonoscopy today.  2. Screening for breast cancer - MM Digital Screening; Future  3. Screening for cervical cancer - Cytology - PAP([place])   No orders of the defined types were placed in this encounter.   Follow-up: Return in about 3 months (around 02/02/2018) for Follow-up  of chronic medical conditions.   Hoy Register MD

## 2017-11-03 ENCOUNTER — Telehealth: Payer: Self-pay

## 2017-11-03 LAB — CBC WITH DIFFERENTIAL/PLATELET
Basophils Absolute: 0 10*3/uL (ref 0.0–0.2)
Basos: 1 %
EOS (ABSOLUTE): 0.3 10*3/uL (ref 0.0–0.4)
EOS: 3 %
HEMATOCRIT: 44 % (ref 34.0–46.6)
Hemoglobin: 14.3 g/dL (ref 11.1–15.9)
IMMATURE GRANULOCYTES: 0 %
Immature Grans (Abs): 0 10*3/uL (ref 0.0–0.1)
Lymphocytes Absolute: 2.5 10*3/uL (ref 0.7–3.1)
Lymphs: 29 %
MCH: 28.2 pg (ref 26.6–33.0)
MCHC: 32.5 g/dL (ref 31.5–35.7)
MCV: 87 fL (ref 79–97)
MONOCYTES: 8 %
MONOS ABS: 0.7 10*3/uL (ref 0.1–0.9)
NEUTROS PCT: 59 %
Neutrophils Absolute: 5 10*3/uL (ref 1.4–7.0)
Platelets: 375 10*3/uL (ref 150–450)
RBC: 5.07 x10E6/uL (ref 3.77–5.28)
RDW: 14.6 % (ref 12.3–15.4)
WBC: 8.4 10*3/uL (ref 3.4–10.8)

## 2017-11-03 LAB — CMP14+EGFR
ALK PHOS: 92 IU/L (ref 39–117)
ALT: 23 IU/L (ref 0–32)
AST: 20 IU/L (ref 0–40)
Albumin/Globulin Ratio: 2 (ref 1.2–2.2)
Albumin: 4.8 g/dL (ref 3.5–5.5)
BUN/Creatinine Ratio: 10 (ref 9–23)
BUN: 7 mg/dL (ref 6–24)
Bilirubin Total: 0.3 mg/dL (ref 0.0–1.2)
CO2: 26 mmol/L (ref 20–29)
CREATININE: 0.69 mg/dL (ref 0.57–1.00)
Calcium: 9.8 mg/dL (ref 8.7–10.2)
Chloride: 103 mmol/L (ref 96–106)
GFR calc Af Amer: 110 mL/min/{1.73_m2} (ref >59)
GFR calc non Af Amer: 96 mL/min/{1.73_m2} (ref >59)
Globulin, Total: 2.4 g/dL (ref 1.5–4.5)
Glucose: 79 mg/dL (ref 65–99)
Potassium: 4.2 mmol/L (ref 3.5–5.2)
Sodium: 143 mmol/L (ref 134–144)
Total Protein: 7.2 g/dL (ref 6.0–8.5)

## 2017-11-03 NOTE — Telephone Encounter (Signed)
Patient was called and informed of lab results.Patient was informed that she will be called with remaining results.

## 2017-11-03 NOTE — Telephone Encounter (Signed)
-----   Message from Enobong Newlin, MD sent at 11/03/2017  1:34 PM EDT ----- Please inform the patient that labs are normal. Thank you. 

## 2017-11-08 LAB — CERVICOVAGINAL PAP THINPREP IMAGED: HERPES (WINDOWPATH): NEGATIVE

## 2017-11-09 LAB — CYTOLOGY - PAP
CHLAMYDIA, DNA PROBE: NEGATIVE
Candida vaginitis: NEGATIVE
Diagnosis: NEGATIVE
HPV (WINDOPATH): NOT DETECTED
NEISSERIA GONORRHEA: NEGATIVE
Trichomonas: NEGATIVE

## 2017-11-10 ENCOUNTER — Telehealth: Payer: Self-pay | Admitting: Family Medicine

## 2017-11-10 ENCOUNTER — Telehealth: Payer: Self-pay

## 2017-11-10 NOTE — Telephone Encounter (Signed)
Patient was called and informed of lab results. 

## 2017-11-10 NOTE — Telephone Encounter (Signed)
Patient called requesting her PAP results. Patient was informed that pap was negative.

## 2017-11-22 ENCOUNTER — Encounter (HOSPITAL_COMMUNITY): Payer: Self-pay | Admitting: Emergency Medicine

## 2017-11-22 ENCOUNTER — Emergency Department (HOSPITAL_COMMUNITY): Payer: Medicare Other

## 2017-11-22 ENCOUNTER — Other Ambulatory Visit: Payer: Self-pay

## 2017-11-22 ENCOUNTER — Emergency Department (HOSPITAL_COMMUNITY)
Admission: EM | Admit: 2017-11-22 | Discharge: 2017-11-22 | Disposition: A | Discharge status: Home / Self Care | Payer: Medicare Other | Attending: Emergency Medicine | Admitting: Emergency Medicine

## 2017-11-22 DIAGNOSIS — Z79899 Other long term (current) drug therapy: Secondary | ICD-10-CM | POA: Insufficient documentation

## 2017-11-22 DIAGNOSIS — J449 Chronic obstructive pulmonary disease, unspecified: Secondary | ICD-10-CM | POA: Insufficient documentation

## 2017-11-22 DIAGNOSIS — R197 Diarrhea, unspecified: Secondary | ICD-10-CM

## 2017-11-22 DIAGNOSIS — Z933 Colostomy status: Secondary | ICD-10-CM | POA: Diagnosis not present

## 2017-11-22 DIAGNOSIS — F17210 Nicotine dependence, cigarettes, uncomplicated: Secondary | ICD-10-CM | POA: Diagnosis not present

## 2017-11-22 DIAGNOSIS — Z9049 Acquired absence of other specified parts of digestive tract: Secondary | ICD-10-CM | POA: Insufficient documentation

## 2017-11-22 DIAGNOSIS — I10 Essential (primary) hypertension: Secondary | ICD-10-CM | POA: Diagnosis not present

## 2017-11-22 DIAGNOSIS — R112 Nausea with vomiting, unspecified: Secondary | ICD-10-CM | POA: Diagnosis not present

## 2017-11-22 DIAGNOSIS — K439 Ventral hernia without obstruction or gangrene: Secondary | ICD-10-CM | POA: Diagnosis not present

## 2017-11-22 DIAGNOSIS — R1084 Generalized abdominal pain: Secondary | ICD-10-CM | POA: Insufficient documentation

## 2017-11-22 LAB — CBC
HCT: 42 % (ref 36.0–46.0)
HEMOGLOBIN: 13.7 g/dL (ref 12.0–15.0)
MCH: 28.4 pg (ref 26.0–34.0)
MCHC: 32.6 g/dL (ref 30.0–36.0)
MCV: 87.1 fL (ref 78.0–100.0)
Platelets: 283 10*3/uL (ref 150–400)
RBC: 4.82 MIL/uL (ref 3.87–5.11)
RDW: 13.9 % (ref 11.5–15.5)
WBC: 8 10*3/uL (ref 4.0–10.5)

## 2017-11-22 LAB — COMPREHENSIVE METABOLIC PANEL
ALBUMIN: 3.8 g/dL (ref 3.5–5.0)
ALK PHOS: 69 U/L (ref 38–126)
ALT: 24 U/L (ref 14–54)
ANION GAP: 7 (ref 5–15)
AST: 25 U/L (ref 15–41)
BILIRUBIN TOTAL: 0.4 mg/dL (ref 0.3–1.2)
BUN: 5 mg/dL — AB (ref 6–20)
CALCIUM: 9.1 mg/dL (ref 8.9–10.3)
CO2: 28 mmol/L (ref 22–32)
CREATININE: 0.68 mg/dL (ref 0.44–1.00)
Chloride: 107 mmol/L (ref 101–111)
GFR calc Af Amer: >60 mL/min (ref >60)
GFR calc non Af Amer: >60 mL/min (ref >60)
GLUCOSE: 80 mg/dL (ref 65–99)
Potassium: 3.6 mmol/L (ref 3.5–5.1)
SODIUM: 142 mmol/L (ref 135–145)
TOTAL PROTEIN: 6.3 g/dL — AB (ref 6.5–8.1)

## 2017-11-22 LAB — URINALYSIS, ROUTINE W REFLEX MICROSCOPIC
Bacteria, UA: NONE SEEN
Bilirubin Urine: NEGATIVE
Glucose, UA: NEGATIVE mg/dL
Ketones, ur: NEGATIVE mg/dL
Leukocytes, UA: NEGATIVE
NITRITE: NEGATIVE
PH: 6 (ref 5.0–8.0)
Protein, ur: NEGATIVE mg/dL
SPECIFIC GRAVITY, URINE: 1.009 (ref 1.005–1.030)

## 2017-11-22 LAB — LIPASE, BLOOD: Lipase: 24 U/L (ref 11–51)

## 2017-11-22 MED ORDER — IOHEXOL 300 MG/ML  SOLN
100.0000 mL | Freq: Once | INTRAMUSCULAR | Status: AC | PRN
  Administered 2017-11-22: 100 mL via INTRAVENOUS

## 2017-11-22 MED ORDER — ONDANSETRON 4 MG PO TBDP: 4.0000 mg | ORAL_TABLET | Freq: Three times a day (TID) | ORAL | 0 refills | Status: DC | PRN

## 2017-11-22 MED ORDER — DICYCLOMINE HCL 20 MG PO TABS
20.0000 mg | ORAL_TABLET | Freq: Two times a day (BID) | ORAL | 0 refills | Status: DC
Start: 2017-11-22 — End: 2018-09-21

## 2017-11-22 MED ORDER — TRAMADOL HCL 50 MG PO TABS: 50.0000 mg | ORAL_TABLET | Freq: Four times a day (QID) | ORAL | 0 refills | Status: DC | PRN

## 2017-11-22 MED ORDER — HYDROMORPHONE HCL 2 MG/ML IJ SOLN
0.5000 mg | Freq: Once | INTRAMUSCULAR | Status: AC
  Administered 2017-11-22: 0.5 mg via INTRAVENOUS
  Filled 2017-11-22: qty 1

## 2017-11-22 MED ORDER — ONDANSETRON HCL 4 MG/2ML IJ SOLN
4.0000 mg | Freq: Once | INTRAMUSCULAR | Status: AC
  Administered 2017-11-22: 4 mg via INTRAVENOUS
  Filled 2017-11-22: qty 2

## 2017-11-22 NOTE — ED Notes (Signed)
Patient transported to CT 

## 2017-11-22 NOTE — Discharge Instructions (Signed)
Please read and follow all provided instructions.  Your diagnoses today include:  1. Generalized abdominal pain   2. Diarrhea, unspecified type     Tests performed today include:  Blood counts and electrolytes  Blood tests to check liver and kidney function  Blood tests to check pancreas function  Urine test to look for infection  CT of your abdomen -no concerning findings or severe infections  Vital signs. See below for your results today.   Medications prescribed:   Vicodin (hydrocodone/acetaminophen) - narcotic pain medication  DO NOT drive or perform any activities that require you to be awake and alert because this medicine can make you drowsy. BE VERY CAREFUL not to take multiple medicines containing Tylenol (also called acetaminophen). Doing so can lead to an overdose which can damage your liver and cause liver failure and possibly death.   Zofran (ondansetron) - for nausea and vomiting   Bentyl - medication for intestinal cramps and spasms  Take any prescribed medications only as directed.  Home care instructions:   Follow any educational materials contained in this packet.  Follow-up instructions: Please follow-up with your primary care provider in the next 2 days for further evaluation of your symptoms.  Also follow-up with the gastroenterology referral when you can/   Return instructions:  SEEK IMMEDIATE MEDICAL ATTENTION IF:  The pain does not go away or becomes severe   A temperature above 101F develops   Repeated vomiting occurs (multiple episodes)   The pain becomes localized to portions of the abdomen. The right side could possibly be appendicitis. In an adult, the left lower portion of the abdomen could be colitis or diverticulitis.   Blood is being passed in stools or vomit (bright red or black tarry stools)   You develop chest pain, difficulty breathing, dizziness or fainting, or become confused, poorly responsive, or inconsolable (young  children)  If you have any other emergent concerns regarding your health  Additional Information: Abdominal (belly) pain can be caused by many things. Your caregiver performed an examination and possibly ordered blood/urine tests and imaging (CT scan, x-rays, ultrasound). Many cases can be observed and treated at home after initial evaluation in the emergency department. Even though you are being discharged home, abdominal pain can be unpredictable. Therefore, you need a repeated exam if your pain does not resolve, returns, or worsens. Most patients with abdominal pain don't have to be admitted to the hospital or have surgery, but serious problems like appendicitis and gallbladder attacks can start out as nonspecific pain. Many abdominal conditions cannot be diagnosed in one visit, so follow-up evaluations are very important.  Your vital signs today were: BP 140/83    Pulse 66    Temp 98.3 F (36.8 C) (Oral)    Resp 18    Ht 5\' 4"  (1.626 m)    Wt 63.5 kg (140 lb)    SpO2 94%    BMI 24.03 kg/m  If your blood pressure (bp) was elevated above 135/85 this visit, please have this repeated by your doctor within one month. --------------

## 2017-11-22 NOTE — ED Provider Notes (Signed)
[facility] EMERGENCY DEPARTMENT Provider Note   CSN: [id number] Arrival date & time: 11/22/17  1212     History   Chief Complaint Chief Complaint  Patient presents with  . Abdominal Pain    HPI Maria Oliver is a 57 y.o. female.  Patient with history of partial colectomy, colostomy status post takedown, large incisional ventral hernia, cholecystectomy --presents with approximately 1 week of abdominal pain with radiation to her back with associated nonbloody diarrhea.  Patient states that she develops a lot of gas and diarrhea after eating solids.  She has had nausea but no vomiting.  No fevers.  Back pain is constant, 6 out of 10.  Abdominal pain is more crampy and intermittent.  It migrates to different areas.  She also has pain that radiates up into her shoulders and her neck which has been ongoing.  She attributes this to her COPD.  No lower extremity numbness, tingling, or weakness.  Home pain medications have not helped with symptoms.  She denies heavy NSAID or alcohol use. The onset of this condition was acute. The course is constant. Aggravating factors: eating. Alleviating factors: none.        Past Medical History:  Diagnosis Date  . Abdominal pain   . Anxiety   . Arthritis   . Asthma   . Chronic cholecystitis with calculus s/p lap cholecystectomy 09/09/2016 09/09/2016  . Complication of anesthesia    slow to arouse post operatively   . COPD (chronic obstructive pulmonary disease) (HCC)   . 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 Generalized headaches   . Hernia, inguinal, right   . Hypertension   . Leg swelling     Patient Active Problem List   Diagnosis Date Noted  . 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 Pruritic rash 10/14/2016  . Chronic narcotic use 09/09/2016  . Onychomycosis of right great toe 10/13/2015  . Osteoarthritis 08/11/2015  . Chronic abdominal pain 05/22/2015  .  Chronic pain syndrome 05/22/2015  . Vitamin D insufficiency 02/06/2015  . Chronic knee pain 02/06/2015  . Ventral hernia without obstruction or gangrene 12/31/2014  . Anxiety 12/31/2014  . COPD (chronic obstructive pulmonary disease) with emphysema (HCC)   . Tobacco abuse 10/01/2009  . Essential hypertension 10/01/2009    Past Surgical History:  Procedure Laterality Date  . COLON SURGERY  2010   per patient "colon take down"  . COLOSTOMY  2010  . LAPAROSCOPIC CHOLECYSTECTOMY SINGLE SITE WITH INTRAOPERATIVE CHOLANGIOGRAM N/A 09/09/2016   Procedure: LAPAROSCOPIC CHOLECYSTECTOMY  WITH INTRAOPERATIVE CHOLANGIOGRAM;  Surgeon: Karie Soda, MD;  Location: [facility];  Service: General;  Laterality: N/A;  . TUBAL LIGATION       OB History   None      Home Medications    Prior to Admission medications   Medication Sig Start Date End Date Taking? Authorizing Provider  albuterol (VENTOLIN HFA) 108 (90 Base) MCG/ACT inhaler Inhale 2 puffs into the lungs every 6 (six) hours as needed for wheezing or shortness of breath. 09/28/17  Yes Hoy Register, MD  cholecalciferol (VITAMIN D) 1000 units tablet Take 1,000 Units by mouth every other day.    Yes [provider]  ibuprofen (ADVIL,MOTRIN) 200 MG tablet Take 400 mg by mouth every 6 (six) hours as needed for mild pain.   Yes [provider]  mometasone-formoterol (DULERA) 200-5 MCG/ACT AERO Inhale 2 puffs into the lungs 2 (two) times daily. 09/28/17  Yes  Hoy Register, MD  Multiple Vitamin (MULTIVITAMIN WITH MINERALS) TABS tablet Take 1 tablet by mouth every other day.   Yes [provider]  ibuprofen (ADVIL,MOTRIN) 600 MG tablet Take 1 tablet (600 mg total) by mouth every 6 (six) hours as needed for moderate pain. Patient not taking: Reported on 06/01/2017 09/02/16   Loren Racer, MD  lisinopril (PRINIVIL,ZESTRIL) 5 MG tablet Take 1 tablet (5 mg total) by mouth daily. Patient not taking: Reported on 11/02/2017  09/28/17   Hoy Register, MD  methocarbamol (ROBAXIN) 500 MG tablet Take 2 tablets (1,000 mg total) by mouth every 12 (twelve) hours as needed for muscle spasms. Patient not taking: Reported on 11/02/2017 09/28/17   Hoy Register, MD  PARoxetine (PAXIL) 30 MG tablet Take 1 tablet (30 mg total) by mouth daily. Patient not taking: Reported on 09/06/2017 06/01/17   Anders Simmonds, PA-C  tiotropium (SPIRIVA HANDIHALER) 18 MCG inhalation capsule Place 1 capsule (18 mcg total) into inhaler and inhale daily. Patient not taking: Reported on 11/02/2017 09/28/17   Hoy Register, MD  triamcinolone ointment (KENALOG) 0.5 % Apply 1 application topically 2 (two) times daily. Apply to back rash Patient not taking: Reported on 06/01/2017 10/14/16   Dessa Phi, MD    Family History Family History  Problem Relation Age of Onset  . Heart disease Mother 57  . Heart failure Mother   . Asthma Mother   . Emphysema Mother   . Alzheimer's disease Father 69  . Emphysema Brother   . Asthma Brother   . Colon cancer Neg Hx     Social History Social History   Tobacco Use  . Smoking status: Current Every Day Smoker    Packs/day: 0.50    Years: 40.00    Pack years: 20.00    Types: Cigarettes  . Smokeless tobacco: Never Used  . Tobacco comment: Recently quit, started back to 4 cigs/daily  Substance Use Topics  . Alcohol use: No    Alcohol/week: 0.0 oz  . Drug use: No     Allergies   Aspirin; Ciprofloxacin; Codeine; Eggs or egg-derived products; Fentanyl; Hydrocodone; Lactose intolerance (gi); Oxycodone; and Penicillins   Review of Systems Review of Systems  Constitutional: Negative for fever.  HENT: Negative for rhinorrhea and sore throat.   Eyes: Negative for redness.  Respiratory: Negative for cough and shortness of breath.   Cardiovascular: Negative for chest pain.  Gastrointestinal: Positive for abdominal pain, diarrhea and nausea. Negative for blood in stool and vomiting.    Genitourinary: Negative for dysuria.  Musculoskeletal: Positive for back pain and neck pain. Negative for myalgias.  Skin: Negative for rash.  Neurological: Negative for headaches.     Physical Exam Updated Vital Signs BP (!) 160/94 (BP Location: Left Arm)   Pulse 62   Temp 98.3 F (36.8 C) (Oral)   Resp 16   Ht 5\' 4"  (1.626 m)   Wt 63.5 kg (140 lb)   SpO2 96%   BMI 24.03 kg/m   Physical Exam  Constitutional: She appears well-developed and well-nourished.  HENT:  Head: Normocephalic and atraumatic.  Eyes: Conjunctivae are normal. Right eye exhibits no discharge. Left eye exhibits no discharge.  Neck: Normal range of motion. Neck supple.  Cardiovascular: Normal rate, regular rhythm and normal heart sounds.  Pulses:      Radial pulses are 2+ on the right side, and 2+ on the left side.  Pulmonary/Chest: Effort normal and breath sounds normal.  Abdominal: Soft. There is generalized tenderness. There  is no rebound and no CVA tenderness. A hernia is present. Hernia confirmed positive in the ventral area (large, soft).  Neurological: She is alert.  Skin: Skin is warm and dry.  Psychiatric: She has a normal mood and affect.  Nursing note and vitals reviewed.    ED Treatments / Results  Labs (all labs ordered are listed, but only abnormal results are displayed) Labs Reviewed  COMPREHENSIVE METABOLIC PANEL - Abnormal; Notable for the following components:      Result Value   BUN 5 (*)    Total Protein 6.3 (*)    All other components within normal limits  URINALYSIS, ROUTINE W REFLEX MICROSCOPIC - Abnormal; Notable for the following components:   Hgb urine dipstick SMALL (*)    All other components within normal limits  LIPASE, BLOOD  CBC    EKG None  Radiology Ct Abdomen Pelvis W Contrast  Result Date: 11/22/2017 CLINICAL DATA:  Abdominal pain common nausea and vomiting for 1 week. EXAM: CT ABDOMEN AND PELVIS WITH CONTRAST TECHNIQUE: Multidetector CT imaging of the  abdomen and pelvis was performed using the standard protocol following bolus administration of intravenous contrast. CONTRAST:  OMNIPAQUE IOHEXOL 300 MG/ML  SOLN COMPARISON:  CT scan 08/03/2016 FINDINGS: Lower chest: The lung bases demonstrate dependent bibasilar atelectasis but no infiltrates, effusion or worrisome lesion. The heart is normal in size given the mild pectus deformity. Hepatobiliary: No focal hepatic lesions or intrahepatic biliary dilatation. The gallbladder is surgically absent. No common bile duct dilatation. Pancreas: No mass, inflammation or ductal dilatation. Spleen: Normal size. Small low-attenuation lesions are likely benign cysts and are stable. Adrenals/Urinary Tract: The adrenal glands and kidneys are unremarkable. Stable left renal cyst. No ureteral or bladder calculi or mass. Stomach/Bowel: The stomach, duodenum, small bowel and colon are grossly normal without oral contrast. No acute inflammatory changes, mass lesions or obstructive findings. Stable large lower anterior abdominal wall hernia containing small bowel and transverse colon. No findings for obstruction or incarceration. Vascular/Lymphatic: The aorta and branch vessels are patent. The major venous structures are patent. No mesenteric or retroperitoneal mass or adenopathy. Reproductive: The uterus and ovaries are unremarkable. Other: No pelvic mass or adenopathy. No free pelvic fluid collections. No inguinal mass or adenopathy. Musculoskeletal: No significant bony findings.  L3 hemangioma noted. IMPRESSION: 1. Large lower anterior abdominal wall/pelvic hernia containing small bowel and colon but no findings for obstruction or incarceration. 2. No acute abdominal/pelvic findings, mass lesions or adenopathy. 3. Status post cholecystectomy.  No biliary dilatation. Electronically Signed   By: Rudie Meyer M.D.   On: 11/22/2017 20:46    Procedures Procedures (including critical care time)  Medications Ordered in  ED Medications  HYDROmorphone (DILAUDID) injection 0.5 mg (0.5 mg Intravenous Given 11/22/17 1918)  ondansetron (ZOFRAN) injection 4 mg (4 mg Intravenous Given 11/22/17 1916)  iohexol (OMNIPAQUE) 300 MG/ML solution 100 mL (100 mLs Intravenous Contrast Given 11/22/17 2005)     Initial Impression / Assessment and Plan / ED Course  I have reviewed the triage vital signs and the nursing notes.  Pertinent labs & imaging results that were available during my care of the patient were reviewed by me and considered in my medical decision making (see chart for details).     Patient seen and examined.  Initial lab work is reassuring.  Given patient's previous surgical history and pain ongoing for a week, will assess for diverticulitis, colitis, other serious etiology with CT.  Patient agrees to proceed.  Vital signs  reviewed and are as follows: BP (!) 160/94 (BP Location: Left Arm)   Pulse 62   Temp 98.3 F (36.8 C) (Oral)   Resp 16   Ht 5\' 4"  (1.626 m)   Wt 63.5 kg (140 lb)   SpO2 96%   BMI 24.03 kg/m   Pain was better controlled after CT scan.  Patient informed of results.  There are no acute findings.  Patient does not want medication for diarrhea.  Will give medication for pain and bentyl for muscle spasms.   Encouraged PCP follow-up. GI referral given and encouraged.   The patient was urged to return to the Emergency Department immediately with worsening of current symptoms, worsening abdominal pain, persistent vomiting, blood noted in stools, fever, or any other concerns. The patient verbalized understanding.    Final Clinical Impressions(s) / ED Diagnoses   Final diagnoses:  Generalized abdominal pain  Diarrhea, unspecified type   Abd pain: Patient with abdominal pain. Vitals are stable, no fever. Labs reassuring. Imaging chronic hernia, no acute findings, obstructions, infection.  No signs of dehydration, patient is tolerating PO's. Lungs are clear and no signs suggestive of PNA.  Low concern for appendicitis, cholecystitis, pancreatitis, ruptured viscus, UTI, kidney stone, aortic dissection, aortic aneurysm or other emergent abdominal etiology. Supportive therapy indicated with return if symptoms worsen.    ED Discharge Orders        Ordered    traMADol (ULTRAM) 50 MG tablet  Every 6 hours PRN     11/22/17 2145    ondansetron (ZOFRAN ODT) 4 MG disintegrating tablet  Every 8 hours PRN     11/22/17 2145    dicyclomine (BENTYL) 20 MG tablet  2 times daily     11/22/17 2145       Renne CriglerGeiple, Fraidy Mccarrick, PA-C 11/22/17 2241    Rolan BuccoBelfi, Melanie, MD 11/22/17 2321

## 2017-11-22 NOTE — ED Triage Notes (Signed)
Patient complains of abdominal pain and diarrhea for a little over a week, history of diverticulitis and multiple colon surgeries. Patient states pain feels similar to previous issues with diverticulitis. Patient alert, oriented, and in no apparent distress at this time.

## 2017-11-22 NOTE — ED Notes (Signed)
Pt back from CT

## 2017-11-28 ENCOUNTER — Other Ambulatory Visit: Payer: Self-pay

## 2017-11-28 ENCOUNTER — Ambulatory Visit (INDEPENDENT_AMBULATORY_CARE_PROVIDER_SITE_OTHER): Payer: Medicare Other | Admitting: Pulmonary Disease

## 2017-11-28 ENCOUNTER — Encounter: Payer: Self-pay | Admitting: Pulmonary Disease

## 2017-11-28 VITALS — BP 156/100 | HR 67 | Ht 64.0 in | Wt 135.4 lb

## 2017-11-28 DIAGNOSIS — J432 Centrilobular emphysema: Secondary | ICD-10-CM

## 2017-11-28 DIAGNOSIS — Z720 Tobacco use: Secondary | ICD-10-CM | POA: Diagnosis not present

## 2017-11-28 DIAGNOSIS — J439 Emphysema, unspecified: Secondary | ICD-10-CM

## 2017-11-28 DIAGNOSIS — I10 Essential (primary) hypertension: Secondary | ICD-10-CM | POA: Diagnosis not present

## 2017-11-28 MED ORDER — PREDNISONE 10 MG PO TABS: ORAL_TABLET | ORAL | 0 refills | Status: DC

## 2017-11-28 MED ORDER — DOXYCYCLINE HYCLATE 100 MG PO TABS: 100.0000 mg | ORAL_TABLET | Freq: Two times a day (BID) | ORAL | 0 refills | Status: DC

## 2017-11-28 NOTE — Addendum Note (Signed)
Addended by: Coral CeoMACK, Mikal Wisman P on: 11/28/2017 12:41 PM   Modules accepted: Level of Service

## 2017-11-28 NOTE — Progress Notes (Addendum)
@Patient  ID: Maria Oliver, female    DOB: [birth year], 59 y.o.   MRN: [id number]  Chief Complaint  Patient presents with  . Acute Visit    Chest tightness, SOB, post nasal drip, using inhaler albuterol 4x/day. Cough with thick mucous. BP 168/100 will recheck.     Referring provider: Hoy Register, MD  HPI: Synopsis: Former patient of Dr. Shelle Iron who has COPD.  She was referred to [facility] pulmonary in 2016 after having pneumonia.  Recent [place] [facility] Encounters:   04/27/2016-office visit-Mcquaid COPD exacerbation. Plan continue Dulera and Spiriva regularly, use albuterol as needed, quit smoking, Nicotrol Inhaler   Tests:  08/02/2014-pulmonary function test- reduced FVC, FEV1, FEV1/FVC ratio, and FEF 25-75 are reduced indicating airway obstruction, slight response to bronchodilators  Imaging:  10/12/2017-chest x-ray-negative for acute cardiopulmonary disease  Cardiac:   Labs:   Micro:   Chart Review:      11/28/17 Acute  57 year old patient presenting today for acute visit.  Patient reporting symptoms of sob, pnd, cough thick mucous, chest tightness, rib cage sore from coughing.   Unfortunately patient is still smoking reported she smoking 5 cigarettes a day.  Patient is adherent to her Dulera inhaler, using her albuterol inhaler as needed, still not taking Spiriva.  Patient reporting she has concerns that the Spiriva may increase her risk of having pneumonia.  Patient has been using her rescue inhaler about 4-5 times a day.  Allergies  Allergen Reactions  . Aspirin Other (See Comments)    REACTION: GI upset/Hernia   . Ciprofloxacin Other (See Comments)    Tendonitis, joint pain, and nausea.  "Interfered with her HCTZ" (also)  . Codeine Nausea And Vomiting and Other (See Comments)    (Also) GI upset/Hernia (CAN TOLERATE DILAUDID & MORPHINE)  . Eggs Or Egg-Derived Products Nausea Only    Stomach pains  . Fentanyl Nausea Only    Tolerates Dilaudid or morphine  much better  . Hydrocodone Nausea And Vomiting  . Lactose Intolerance (Gi) Nausea And Vomiting and Other (See Comments)    (Also) vertigo  . Oxycodone Nausea And Vomiting    Tolerates hydromorphone better  . Penicillins Rash    Has patient had a PCN reaction causing immediate rash, facial/tongue/throat swelling, SOB or lightheadedness with hypotension: Yes Has patient had a PCN reaction causing severe rash involving mucus membranes or skin necrosis: No Has patient had a PCN reaction that required hospitalization No Has patient had a PCN reaction occurring within the last 10 years: No If all of the above answers are "NO", then may proceed with Cephalosporin use.     Immunization History  Administered Date(s) Administered  . Tdap 10/07/2013    Past Medical History:  Diagnosis Date  . Abdominal pain   . Anxiety   . Arthritis   . Asthma   . Chronic cholecystitis with calculus s/p lap cholecystectomy 09/09/2016 09/09/2016  . Complication of anesthesia    slow to arouse post operatively   . COPD (chronic obstructive pulmonary disease) (HCC)   . 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 Generalized headaches   . Hernia, inguinal, right   . Hypertension   . Leg swelling     Tobacco History: Social History   Tobacco Use  Smoking Status Current Every Day Smoker  . Packs/day: 1.00  . Years: 40.00  . Pack years: 40.00  . Types: Cigarettes  Smokeless Tobacco Never Used  Tobacco Comment   5 cigarettes/day 11/28/17   Ready  to quit: Yes Counseling given: Yes Comment: 5 cigarettes/day 11/28/17 Discussed smoking extensively.  Encourage patient to use a multitude of methods such as reduced to quit, smoking cessation completely, or using Nicotrol Inhaler as previously discussed with Dr. Kendrick FriesMcquaid.  Patient also more than welcome to use nicotine patches or nicotine gum.  Information provided to patient on patient discharge paperwork for support on smoking  cessation.  Will refer to lung cancer screening program today.  Based on your 40-pack-year smoking history.  Outpatient Encounter Medications as of 11/28/2017  Medication Sig  . albuterol (VENTOLIN HFA) 108 (90 Base) MCG/ACT inhaler Inhale 2 puffs into the lungs every 6 (six) hours as needed for wheezing or shortness of breath.  . cholecalciferol (VITAMIN D) 1000 units tablet Take 1,000 Units by mouth every other day.   . dicyclomine (BENTYL) 20 MG tablet Take 1 tablet (20 mg total) by mouth 2 (two) times daily.  Marland Kitchen. ibuprofen (ADVIL,MOTRIN) 200 MG tablet Take 400 mg by mouth every 6 (six) hours as needed for mild pain.  . mometasone-formoterol (DULERA) 200-5 MCG/ACT AERO Inhale 2 puffs into the lungs 2 (two) times daily.  . Multiple Vitamin (MULTIVITAMIN WITH MINERALS) TABS tablet Take 1 tablet by mouth every other day.  . ondansetron (ZOFRAN ODT) 4 MG disintegrating tablet Take 1 tablet (4 mg total) by mouth every 8 (eight) hours as needed for nausea or vomiting.  . traMADol (ULTRAM) 50 MG tablet Take 1 tablet (50 mg total) by mouth every 6 (six) hours as needed.  . doxycycline (VIBRA-TABS) 100 MG tablet Take 1 tablet (100 mg total) by mouth 2 (two) times daily.  . predniSONE (DELTASONE) 10 MG tablet 4 tabs for 2 days, then 3 tabs for 2 days, 2 tabs for 2 days, then 1 tab for 2 days, then stop   No facility-administered encounter medications on file as of 11/28/2017.      Review of Systems  Constitutional:  +fatigue  No  weight loss, night sweats,  fevers, chills HEENT:  +sneezing, post nasal drip, nasal congestion at first  No headaches,  Difficulty swallowing,  Tooth/dental problems, or  Sore throat, No sneezing, itching, ear ache CV: No chest pain,  orthopnea, PND, swelling in lower extremities, anasarca, dizziness, palpitations, syncope  GI:  +smaller meals due to decreased appetite  No heartburn, indigestion, abdominal pain, nausea, vomiting, diarrhea, change in bowel habits, loss of  appetite, bloody stools Resp: +productive cough with white thick mucous, sob exertion and rest, wheezing    No coughing up of blood.  No change in color of mucus.  No chest wall deformity Skin: no rash, lesions, no skin changes. GU: no dysuria, change in color of urine, no urgency or frequency.  No flank pain, no hematuria  MS:  No joint pain or swelling.  No decreased range of motion.  No back pain. Psych:  No change in mood or affect. No depression or anxiety.  No memory loss.   Physical Exam  BP (!) 156/100 (BP Location: Left Arm, Patient Position: Sitting, Cuff Size: Normal)   Pulse 67   Ht 5\' 4"  (1.626 m)   Wt 135 lb 6.4 oz (61.4 kg)   SpO2 97%   BMI 23.24 kg/m   Wt Readings from Last 3 Encounters:  11/28/17 135 lb 6.4 oz (61.4 kg)  11/22/17 140 lb (63.5 kg)  11/02/17 137 lb 6.4 oz (62.3 kg)    GEN: A/Ox3; pleasant , NAD, well nourished, chronically ill     HEENT:  [place]/AT,  EACs-clear, TMs-wnl, NOSE-clear, THROAT- +post nasal drip   NECK:  Supple w/ fair ROM; no JVD; normal carotid impulses w/o bruits; no thyromegaly or nodules palpated; no lymphadenopathy.    RESP: +expiratory wheezes, air movement through out, occasionally deep breath illcits cough   no accessory muscle use, no dullness to percussion  CARD:  RRR, no m/r/g, no peripheral edema, pulses intact, no cyanosis or clubbing.  GI:   Soft & nt; nml bowel sounds; no organomegaly or masses detected.   Musco: Warm bil, no deformities or joint swelling noted.   Neuro: alert, no focal deficits noted.    Skin: Warm, no lesions or rashes    Lab Results:  CBC    Component Value Date/Time   WBC 8.0 11/22/2017 1244   RBC 4.82 11/22/2017 1244   HGB 13.7 11/22/2017 1244   HGB 14.3 11/02/2017 1621   HCT 42.0 11/22/2017 1244   HCT 44.0 11/02/2017 1621   PLT 283 11/22/2017 1244   PLT 375 11/02/2017 1621   MCV 87.1 11/22/2017 1244   MCV 87 11/02/2017 1621   MCH 28.4 11/22/2017 1244   MCHC 32.6 11/22/2017 1244     RDW 13.9 11/22/2017 1244   RDW 14.6 11/02/2017 1621   LYMPHSABS 2.5 11/02/2017 1621   MONOABS 0.6 09/02/2016 1955   EOSABS 0.3 11/02/2017 1621   BASOSABS 0.0 11/02/2017 1621    BMET    Component Value Date/Time   NA 142 11/22/2017 1244   NA 143 11/02/2017 1621   K 3.6 11/22/2017 1244   CL 107 11/22/2017 1244   CO2 28 11/22/2017 1244   GLUCOSE 80 11/22/2017 1244   BUN 5 (L) 11/22/2017 1244   BUN 7 11/02/2017 1621   CREATININE 0.68 11/22/2017 1244   CREATININE 0.67 08/11/2015 1054   CALCIUM 9.1 11/22/2017 1244   GFRNONAA >60 11/22/2017 1244   GFRNONAA >89 08/11/2015 1054   GFRAA >60 11/22/2017 1244   GFRAA >89 08/11/2015 1054    BNP No results found for: BNP  ProBNP No results found for: PROBNP  Imaging: Ct Abdomen Pelvis W Contrast  Result Date: 11/22/2017 CLINICAL DATA:  Abdominal pain common nausea and vomiting for 1 week. EXAM: CT ABDOMEN AND PELVIS WITH CONTRAST TECHNIQUE: Multidetector CT imaging of the abdomen and pelvis was performed using the standard protocol following bolus administration of intravenous contrast. CONTRAST:  OMNIPAQUE IOHEXOL 300 MG/ML  SOLN COMPARISON:  CT scan 08/03/2016 FINDINGS: Lower chest: The lung bases demonstrate dependent bibasilar atelectasis but no infiltrates, effusion or worrisome lesion. The heart is normal in size given the mild pectus deformity. Hepatobiliary: No focal hepatic lesions or intrahepatic biliary dilatation. The gallbladder is surgically absent. No common bile duct dilatation. Pancreas: No mass, inflammation or ductal dilatation. Spleen: Normal size. Small low-attenuation lesions are likely benign cysts and are stable. Adrenals/Urinary Tract: The adrenal glands and kidneys are unremarkable. Stable left renal cyst. No ureteral or bladder calculi or mass. Stomach/Bowel: The stomach, duodenum, small bowel and colon are grossly normal without oral contrast. No acute inflammatory changes, mass lesions or obstructive  findings. Stable large lower anterior abdominal wall hernia containing small bowel and transverse colon. No findings for obstruction or incarceration. Vascular/Lymphatic: The aorta and branch vessels are patent. The major venous structures are patent. No mesenteric or retroperitoneal mass or adenopathy. Reproductive: The uterus and ovaries are unremarkable. Other: No pelvic mass or adenopathy. No free pelvic fluid collections. No inguinal mass or adenopathy. Musculoskeletal: No significant bony findings.  L3  hemangioma noted. IMPRESSION: 1. Large lower anterior abdominal wall/pelvic hernia containing small bowel and colon but no findings for obstruction or incarceration. 2. No acute abdominal/pelvic findings, mass lesions or adenopathy. 3. Status post cholecystectomy.  No biliary dilatation. Electronically Signed   By: Rudie Meyer M.D.   On: 11/22/2017 20:46     Assessment & Plan:   Pleasant 57 year old patient seen in office today.  Unfortunately the patient continues to smoke.  Is also nonadherent to her COPD medications.  Discussed with patient at length than the need to stop smoking, as well as to take her Spiriva adherently.  Patient states she has Spiriva at home and will start using it again.  Patient denies need for samples at this time.  COPD exacerbation today, will treat with doxycycline as well as a prednisone taper today.  Patient needs to resume her controller inhalers as discussed in office visit today.  Patient also needs to stop smoking.  After discussing tobacco history with patient edited tobacco use part of the EMR.  Patient does qualify for lung cancer screening.  Discussed with patient today at office.  Will refer patient today.  Of note patient's blood pressure is elevated today at appointment.  Patient says it has been elevated like that for the past couple months.  Patient has not seen primary care regarding this issue.  Emphasized the importance the patient is to follow-up  with primary care notify them that the blood pressure is elevated.  Patient agrees to call them today.  Patient also reporting need of help with finances as well as local community resources due to socioeconomic issues.  Patient will be for referred to case management.  COPD (chronic obstructive pulmonary disease) with emphysema Doxycycline >>> 1 100 mg tablet every 12 hours for 7 days >>> Take with food >>> Encourage you to start having yogurt or start a probiotic to help with good Bacteria  Prednisone - 10mg  tablet >>>4 tabs for 2 days, then 3 tabs for 2 days, 2 tabs for 2 days, then 1 tab for 2 days, then stop  Continue Dulera inhaler Restart using Spiriva HandiHaler which you have at home >>> Contact our office after you are finished so we can do a refill  Contact primary care to discuss blood pressure readings that you have been having >>> Let them know that you are off blood pressure medication  Will refer to lung cancer screening program today >>> This will be at our office >>> You will meet with Kandice Robinsons our nurse practitioner heads-up this program >>> We are referring you based off of your smoking pack-year history of greater than 30 pack years, and still smoking  1 800 QUIT NOW  >>> Patient to call this resource and utilize it to help support her quit smoking >>> Keep up your hard work with reducing smoking   You can also contact the [facility] >>>For smoking cessation classes call [phone]  We will also refer you to case management for further support with community resources and your finances.   Follow-up with Dr. Kendrick Fries in 2 months.  Tobacco abuse  Will refer to lung cancer screening program today >>> This will be at our office >>> You will meet with Kandice Robinsons our nurse practitioner heads-up this program >>> We are referring you based off of your smoking pack-year history of greater than 30 pack years, and still smoking  1 800 QUIT NOW   >>> Patient to call this resource and  utilize it to help support her quit smoking >>> Keep up your hard work with reducing smoking   You can also contact the [facility] >>>For smoking cessation classes call [phone]  We will also refer you to case management for further support with community resources and your finances.   Follow-up with Dr. Kendrick Fries in 2 months.  Essential hypertension Follow-up with primary care regarding blood pressure Notify them of your blood pressure reading today Do not wait for your August appointment to address your elevated blood pressure as discussed in your office visit today     Coral Ceo, NP 11/28/2017

## 2017-11-28 NOTE — Assessment & Plan Note (Signed)
Follow-up with primary care regarding blood pressure Notify them of your blood pressure reading today Do not wait for your August appointment to address your elevated blood pressure as discussed in your office visit today

## 2017-11-28 NOTE — Patient Outreach (Addendum)
[facility] Care Management  11/28/2017  Maria Oliver 1960/07/30 [id number]   Referral Date: 11/28/17 Referral Source: MD Referral Referral Reason: Community Resources and Corporate investment bankerinancial Assistance and med management   Outreach Attempt: Spoke with patient.  She is able to verify HIPAA.  Patient states that she told her doctor that she does not have air conditioning. Patient states she had a system installed but it does not cool her trailer and she knows she needs the air for her COPD.  Patient states she was waiting on a weatherization program as she was told that her trailer was not insulated enough.  She states when she did cool her trailer her electric bill was over $300.00.     Social: Patient lives alone but does have a son that comes by to help do things such as mow the lawn.  Patient is independent with all aspects of care. Patient still drives.    Conditions: Patient has COPD and HTN she has not done a good job in managing both.  Patient has been non-adherent with her inhalers per physician.  Patient still smokes cigarettes.  Patient with current COPD exacerbation and will be starting an antibiotic. Patient also has some colon issues, anxiety, depression.  Patient reports that she was taking paxil but refused to take it due to feeling sleepy.  Patient states she will be getting back in touch with behavioral health to assist her.  Patient also states she has a hernia for which she went to the ER recently for because of pain.  Patient to see GI next week.   Medications: Patient states she takes her medications as prescribed but patient does not take her inhaler as prescribed per physician.  Patient denies any problems affording medications.   Appointments:  Patient saw PCP last month and pulmonology today.  Patient to see GI next week.   Advanced Directives: Patient does not have advanced directive.     Consent: D[facility] and how  program could assist her. Patient in agreement for health coach, social work and pharmacy.     Plan: RN CM will refer to health coach, social, and pharmacy.  Bary Lericheionne J Maria Plaugher, RN, MSN [facility] Care [facility] Care Management Coordinator Direct Line [phone] Toll Free: [phone]  Fax: [phone]

## 2017-11-28 NOTE — Patient Instructions (Addendum)
Doxycycline >>> 1 100 mg tablet every 12 hours for 7 days >>> Take with food >>> Encourage you to start having yogurt or start a probiotic to help with good Bacteria  Prednisone - 10mg  tablet >>>4 tabs for 2 days, then 3 tabs for 2 days, 2 tabs for 2 days, then 1 tab for 2 days, then stop  Continue Dulera inhaler Restart using Spiriva HandiHaler which you have at home >>> Contact our office after you are finished so we can do a refill  Contact primary care to discuss blood pressure readings that you have been having >>> Let them know that you are off blood pressure medication  Will refer to lung cancer screening program today >>> This will be at our office >>> You will meet with Kandice RobinsonsSarah Groce our nurse practitioner heads-up this program >>> We are referring you based off of your smoking pack-year history of greater than 30 pack years, and still smoking  1 800 QUIT NOW  >>> Patient to call this resource and utilize it to help support her quit smoking >>> Keep up your hard work with reducing smoking   You can also contact the [facility] >>>For smoking cessation classes call [phone]  We will also refer you to case management for further support with community resources and your finances.   Follow-up with Dr. Kendrick FriesMcquaid in 2 months.    Please contact the office if your symptoms worsen or you have concerns that you are not improving.   Thank you for choosing  [facility] for your healthcare, and for allowing us to partner with you on your healthcare journey. I am thankful to be able to provide care to you today.   Elisha HeadlandBrian Siah Steely FNP-C

## 2017-11-28 NOTE — Assessment & Plan Note (Signed)
  Will refer to lung cancer screening program today >>> This will be at our office >>> You will meet with Kandice RobinsonsSarah Groce our nurse practitioner heads-up this program >>> We are referring you based off of your smoking pack-year history of greater than 30 pack years, and still smoking  1 800 QUIT NOW  >>> Patient to call this resource and utilize it to help support her quit smoking >>> Keep up your hard work with reducing smoking   You can also contact the [facility] >>>For smoking cessation classes call [phone]  We will also refer you to case management for further support with community resources and your finances.   Follow-up with Dr. Kendrick FriesMcquaid in 2 months.

## 2017-11-28 NOTE — Assessment & Plan Note (Signed)
Doxycycline >>> 1 100 mg tablet every 12 hours for 7 days >>> Take with food >>> Encourage you to start having yogurt or start a probiotic to help with good Bacteria  Prednisone - 10mg  tablet >>>4 tabs for 2 days, then 3 tabs for 2 days, 2 tabs for 2 days, then 1 tab for 2 days, then stop  Continue Dulera inhaler Restart using Spiriva HandiHaler which you have at home >>> Contact our office after you are finished so we can do a refill  Contact primary care to discuss blood pressure readings that you have been having >>> Let them know that you are off blood pressure medication  Will refer to lung cancer screening program today >>> This will be at our office >>> You will meet with Kandice RobinsonsSarah Groce our nurse practitioner heads-up this program >>> We are referring you based off of your smoking pack-year history of greater than 30 pack years, and still smoking  1 800 QUIT NOW  >>> Patient to call this resource and utilize it to help support her quit smoking >>> Keep up your hard work with reducing smoking   You can also contact the [facility] >>>For smoking cessation classes call [phone]  We will also refer you to case management for further support with community resources and your finances.   Follow-up with Dr. Kendrick FriesMcquaid in 2 months.

## 2017-11-29 ENCOUNTER — Encounter: Payer: Self-pay | Admitting: *Deleted

## 2017-11-29 ENCOUNTER — Other Ambulatory Visit: Payer: Self-pay | Admitting: Acute Care

## 2017-11-29 DIAGNOSIS — Z122 Encounter for screening for malignant neoplasm of respiratory organs: Secondary | ICD-10-CM

## 2017-11-29 DIAGNOSIS — F17210 Nicotine dependence, cigarettes, uncomplicated: Secondary | ICD-10-CM

## 2017-11-30 ENCOUNTER — Telehealth: Payer: Self-pay | Admitting: Family Medicine

## 2017-11-30 NOTE — Telephone Encounter (Signed)
error 

## 2017-12-01 ENCOUNTER — Other Ambulatory Visit: Payer: Self-pay | Admitting: Pharmacist

## 2017-12-01 NOTE — Patient Outreach (Signed)
[facility]) Care Management  12/01/2017  Maria Oliver 25-Dec-1960 [id number]   57 year old female referred to [facility] Care Management by Dr.Mack, pulmonologist, for community resources, financial assistance, and medication management.  PMHx includes, but not limited to, COPD, current tobacco abuse, anxiety, asthma, HTN, arthritis and hx partial colectomy colostomy s/p takedown. ED visit on 6/11 for abdominal pain, CT neg for acute findings.   Per recent office visit 11/28/2017 with pulmonologist:  "Unfortunately patient is still smoking reported she smoking 5 cigarettes a day.  Patient is adherent to her Dulera inhaler, using her albuterol inhaler as needed, still not taking Spiriva.  Patient reporting she has concerns that the Spiriva may increase her risk of having pneumonia.  Patient has been using her rescue inhaler about 4-5 times a day."   Call placed to Ms. Arras today regarding her medications.  Patient agreeable to review medications. HIPAA identifiers verified.   Subjective:  Patient states she is in need of a mattress and requested any assistance available.  She reports that she has resumed taking her Spiriva medication since office visit with pulmonary provider.  She reports that she continues to smoke. Patient states she has picked up antibiotic and steroid medications from pharmacy and is taking them.    Objective:  Medications Reviewed Today    Reviewed by Wynonia Hazard, RPH (Pharmacist) on 12/01/17 at 1523  Med List Status: <None>  Medication Order Taking? Sig Documenting Provider Last Dose Status Informant  albuterol (VENTOLIN HFA) 108 (90 Base) MCG/ACT inhaler [id number] Yes Inhale 2 puffs into the lungs every 6 (six) hours as needed for wheezing or shortness of breath. Hoy Register, MD Taking Active Self  cholecalciferol (VITAMIN D) 1000 units tablet [id number] Yes Take 1,000 Units by mouth every other day.  [provider] Taking Active Self          Med Note Obie Dredge   Tue Nov 22, 2017 12:57 PM)    dicyclomine (BENTYL) 20 MG tablet [id number] Yes Take 1 tablet (20 mg total) by mouth 2 (two) times daily. Renne Crigler, PA-C Taking Active   doxycycline (VIBRA-TABS) 100 MG tablet [id number] Yes Take 1 tablet (100 mg total) by mouth 2 (two) times daily. Coral Ceo, NP Taking Active   ibuprofen (ADVIL,MOTRIN) 200 MG tablet [id number] Yes Take 400 mg by mouth every 6 (six) hours as needed for mild pain. [provider] Taking Active Self  mometasone-formoterol (DULERA) 200-5 MCG/ACT Sandrea Matte [id number] Yes Inhale 2 puffs into the lungs 2 (two) times daily. Hoy Register, MD Taking Active Self  Multiple Vitamin (MULTIVITAMIN WITH MINERALS) TABS tablet [id number] Yes Take 1 tablet by mouth every other day. [provider] Taking Active Self           Med Note Earlene Plater, Lowanda Foster   Tue Nov 22, 2017 12:56 PM)    ondansetron (ZOFRAN ODT) 4 MG disintegrating tablet [id number] Yes Take 1 tablet (4 mg total) by mouth every 8 (eight) hours as needed for nausea or vomiting. Renne Crigler, PA-C Taking Active   predniSONE (DELTASONE) 10 MG tablet [id number] Yes 4 tabs for 2 days, then 3 tabs for 2 days, 2 tabs for 2 days, then 1 tab for 2 days, then stop Coral Ceo, NP Taking Active   tiotropium (SPIRIVA) 18 MCG inhalation capsule [id number] Yes Place 18 mcg into inhaler and inhale daily. [provider] Taking Active   traMADol (ULTRAM) 50 MG tablet [id number] Yes Take 1 tablet (50 mg  total) by mouth every 6 (six) hours as needed. Renne CriglerGeiple, Joshua, PA-C Taking Active            Assessment:  Drugs sorted by system:  Neurologic/Psychologic:  Cardiovascular:  Pulmonary/Allergy: albuterol inhaler, mometasone-formoterol inhaler, tiotropium inhaler  Gastrointestinal:dicyclomine, ondansetron  Endocrine: prednisone  Renal:  Topical:  Pain: ibuprofen, tramadol  Vitamins/Minerals: MVI  Infectious  Diseases:doxycycline  Miscellaneous:  Medication management: Patient with COPD and current smoker, prescribed LABA/ICS + LAMA + SABA.  Patient taking Spiriva after recent office visit.  I reviewed importance of smoking cessation and  taking LABA/ICS and LAMA consistently.  We discussed that these are maintenance inhalers versus the rescue inhaler, albuterol.  We reviewed dosing of each medication and administration technique. Patient verbalized understanding and states she will try to take both maintenance inhalers regularly.  Patient aware to rinse mouth after LABA/ICS.  We also reviewed differences between Spiriva Respimat and Spiriva Handihaler in case patient interested in changing to different formulation in the future.  I provided my phone number in case patient needs to reach out to me again in the future.    Plan: I will route note to LCSW regarding mattress request.   I will close case at this time. I am happy to assist in the future as needed.   Haynes Hoehnolleen Lamoyne Palencia, PharmD, [facility] Clinical Pharmacist [facility] [phone]

## 2017-12-05 ENCOUNTER — Telehealth: Payer: Self-pay | Admitting: Family Medicine

## 2017-12-05 ENCOUNTER — Other Ambulatory Visit: Payer: Self-pay | Admitting: *Deleted

## 2017-12-05 ENCOUNTER — Telehealth: Payer: Self-pay | Admitting: Pulmonary Disease

## 2017-12-05 MED ORDER — TIOTROPIUM BROMIDE MONOHYDRATE 18 MCG IN CAPS: 18.0000 ug | ORAL_CAPSULE | Freq: Every day | RESPIRATORY_TRACT | 3 refills | Status: DC

## 2017-12-05 NOTE — Patient Outreach (Addendum)
[facility]) Care Management  12/05/2017  Madie Renollen D Kistner [birth year] [id number]   Phone call to the [facility] to discuss referral for a matress. This agency requires  Referral from one of their partnering agencies. [facility] given as a resource and contacted, however they only provide the referral through for their client base.   Phone call to th [facility] for follow up on self referral to their weatherization program. Spoke with [employer]ine Felts, who confirmed that patient's application has been approved  however there is a 6 to 9 month waiting list for them to come out to assess her home. This Child psychotherapistsocial worker will confirm with patient the above information obtained.  Adriana ReamsChrystal Avaiah Stempel, LCSW [facility] Care Management [phone]

## 2017-12-05 NOTE — Patient Outreach (Signed)
[facility]) Care Management  12/05/2017  Madie Renollen D Marcoe 1961/02/04 [id number]   Patient referred to this social worker by telephonic RNCM to assist patient with community resources to address her air conditioning concerns and her previous  involvement with the weatherization program. This social worker also reviewed [facility] were patient is also requesting assistance with a mattress.  Initial attempt  to reach patient, however she did not answer. HIPP compliant voicemail message left requesting a return call.   Plan: This social worker will make second attempt to reach patient within 3-4 business days. Outreach letter to be sent to patient.    Adriana ReamsChrystal Lacey Dotson, LCSW [facility] Care Management [phone]

## 2017-12-05 NOTE — Telephone Encounter (Signed)
Called patient, unable to reach. Left message to give us a call back. Prescription has been sent to pharmacy per the ok from Elisha HeadlandBrian Mack last OV note.     Continue Dulera inhaler Restart using Spiriva HandiHaler which you have at home >>> Contact our office after you are finished so we can do a refill  Contact primary care to discuss blood pressure readings that you have been having >>> Let them know that you are off blood pressure medication  Will refer to lung cancer screening program today >>> This will be at our office >>> You will meet with Kandice RobinsonsSarah Groce our nurse practitioner heads-up this program

## 2017-12-05 NOTE — Telephone Encounter (Signed)
4 page paperwork received through fax 12-05-17. ° °

## 2017-12-05 NOTE — Patient Outreach (Signed)
[facility]) Care Management  12/05/2017  Maria Oliver January 05, 1961 [id number]    Return call from patient. Patient referred to this social worker by telephonic RNCM to assist patient with community resources to address her air conditioning concerns and her previous  involvement with the weatherization program. This social worker also reviewed [facility] were patient is also requesting assistance with a mattress  Per patient, she applied for services through the weatherization program approximately 1 month ago, however is unsure where she is in the process. Per patient, her main issue is needing a new mattress. Per patient, she recently treated her home with bed bugs and had to throw out the majority of her furniture. Per patient, she is now sleeping in a uncomfortable lounge chair. Referral to the [facility] discussed as a possibility.  Plan: This social worker will contact the [facility] regarding a referral for a mattress. This social worker will contact the weatherization  program to discuss where patient is in the process for an assessment to be done in her home.    Maria ReamsChrystal Kashena Novitski, LCSW [facility] Care Management [phone]

## 2017-12-07 ENCOUNTER — Ambulatory Visit
Admission: RE | Admit: 2017-12-07 | Discharge: 2017-12-07 | Disposition: A | Discharge status: Home / Self Care | Payer: Medicare Other | Source: Ambulatory Visit | Attending: Family Medicine | Admitting: Family Medicine

## 2017-12-07 ENCOUNTER — Telehealth: Payer: Self-pay | Admitting: Pulmonary Disease

## 2017-12-07 DIAGNOSIS — Z1239 Encounter for other screening for malignant neoplasm of breast: Secondary | ICD-10-CM

## 2017-12-07 MED ORDER — TIOTROPIUM BROMIDE MONOHYDRATE 2.5 MCG/ACT IN AERS: 2.0000 | INHALATION_SPRAY | Freq: Every day | RESPIRATORY_TRACT | 5 refills | Status: DC

## 2017-12-07 NOTE — Telephone Encounter (Signed)
Spoke with pt. She is aware of Brian's response. I have clarified with Maria Oliver which strength to send in. Rx has been sent in. Nothing further was needed.

## 2017-12-07 NOTE — Telephone Encounter (Signed)
Pt is returning phone call. CB is [phone].

## 2017-12-07 NOTE — Telephone Encounter (Signed)
Spoke with pt. At her last OV, Maria Oliver wanted to restart Spiriva Handihaler. States that she can't tolerate the capsules. Pt is requesting to have a prescription for Spiriva Respimat.  Maria Oliver - please advise. Thanks!

## 2017-12-07 NOTE — Telephone Encounter (Signed)
Attempted to call pt. I did not receive an answer. I have left a message for pt to return our call.  

## 2017-12-07 NOTE — Telephone Encounter (Signed)
Okay to switch prescription to Spiriva Respimat.  Do not combine Spiriva Respimat and Spiriva HandiHaler.  Please place the order.  If she continues to have issues using her inhalers, she can have an office visit to further discuss and work-up.  Elisha HeadlandBrian Bert Ptacek FNP

## 2017-12-12 LAB — CERVICOVAGINAL ANCILLARY ONLY: Herpes: NEGATIVE

## 2017-12-13 ENCOUNTER — Other Ambulatory Visit: Payer: Self-pay | Admitting: *Deleted

## 2017-12-14 ENCOUNTER — Other Ambulatory Visit: Payer: Self-pay | Admitting: *Deleted

## 2017-12-14 ENCOUNTER — Encounter: Payer: Self-pay | Admitting: *Deleted

## 2017-12-14 NOTE — Patient Outreach (Signed)
[facility]) Care Management  12/14/2017  Madie Renollen D Cardarelli July 17, 1960 [id number]   Phone call to patient to provide community resources for a mattress. Per patient , she did receive a friends recliner that is much more comfortable. Per patient, she went to the [employer] and [employer] for a new mattress, however does not have the money at this time to purchase one. Per patient, she will continue to work with the [facility] but will need a referral. Patient plans to [facility] with [facility] to check the possibility of obtaining a referral from them.  Patient further states that the inspector from the Weatherization program  came to inspect her home, however she would have to remove the tub to treat the area for mold before they will be able to come back and do another inspection to repair her home. Per patient, she has someone to remove the tub and will get the inspector back out as soon as possible. This Child psychotherapistsocial worker explained that she has been provided with all know resources for a mattress. Patient to be closed to social work at this time. Patient encouraged to call this social worker if there are any further questions or concerns.   Adriana ReamsChrystal Land, LCSW [facility] Care Management [phone]

## 2017-12-16 ENCOUNTER — Telehealth: Payer: Self-pay

## 2017-12-16 NOTE — Patient Outreach (Signed)
[facility]) Care Management  12/16/2017   Maria Oliver 11/03/1960 [id number]  RN Health Coach telephone call to patient.  Hipaa compliance verified. Per patient she is doing fair. Patient has a cough that is productive of clear sputum. Patient stated she is using her inhalers. Patient lives alone and son checks on her. Patient states it is hard for her to breathe and she goes out in the evenings. She has not heard of the zones and action plan of COPD.Patient has agreed to further out reach calls.    Current Medications:  Current Outpatient Medications  Medication Sig Dispense Refill  . albuterol (VENTOLIN HFA) 108 (90 Base) MCG/ACT inhaler Inhale 2 puffs into the lungs every 6 (six) hours as needed for wheezing or shortness of breath. 18 g 3  . cholecalciferol (VITAMIN D) 1000 units tablet Take 1,000 Units by mouth every other day.     . dicyclomine (BENTYL) 20 MG tablet Take 1 tablet (20 mg total) by mouth 2 (two) times daily. 20 tablet 0  . doxycycline (VIBRA-TABS) 100 MG tablet Take 1 tablet (100 mg total) by mouth 2 (two) times daily. 14 tablet 0  . ibuprofen (ADVIL,MOTRIN) 200 MG tablet Take 400 mg by mouth every 6 (six) hours as needed for mild pain.    . mometasone-formoterol (DULERA) 200-5 MCG/ACT AERO Inhale 2 puffs into the lungs 2 (two) times daily. 1 Inhaler 5  . Multiple Vitamin (MULTIVITAMIN WITH MINERALS) TABS tablet Take 1 tablet by mouth every other day.    . ondansetron (ZOFRAN ODT) 4 MG disintegrating tablet Take 1 tablet (4 mg total) by mouth every 8 (eight) hours as needed for nausea or vomiting. 10 tablet 0  . predniSONE (DELTASONE) 10 MG tablet 4 tabs for 2 days, then 3 tabs for 2 days, 2 tabs for 2 days, then 1 tab for 2 days, then stop 20 tablet 0  . Tiotropium Bromide Monohydrate (SPIRIVA RESPIMAT) 2.5 MCG/ACT AERS Inhale 2 puffs into the lungs daily. 1 Inhaler 5  . traMADol (ULTRAM) 50 MG tablet Take 1 tablet (50 mg total) by mouth every 6 (six)  hours as needed. 15 tablet 0   No current facility-administered medications for this visit.     Functional Status:  In your present state of health, do you have any difficulty performing the following activities: 12/13/2017  Hearing? N  Vision? N  Difficulty concentrating or making decisions? N  Walking or climbing stairs? Y  Dressing or bathing? N  Doing errands, shopping? Y  Preparing Food and eating ? N  Using the Toilet? N  In the past six months, have you accidently leaked urine? N  Do you have problems with loss of bowel control? N  Managing your Medications? N  Managing your Finances? N  Housekeeping or managing your Housekeeping? N  Some recent data might be hidden    Fall/Depression Screening: Fall Risk  12/13/2017 11/02/2017 09/28/2017  Falls in the past year? No No No  Number falls in past yr: - - -  Injury with Fall? - - -   PHQ 2/9 Scores 12/13/2017 11/28/2017 11/02/2017 06/01/2017 01/07/2017 11/19/2016 10/14/2016  PHQ - 2 Score 6 6 4 2 2 2 2   PHQ- 9 Score 10 10 12 5 5 4 6    THN CM Care Plan Problem One     Most Recent Value  Care Plan Problem One  Knowledge Deficit in Self Management of COPD  Role Documenting the Problem One  Health Coach  Care Plan for Problem One  Active  [facility]N Long Term Goal   Patient with not have any readmission within the next 90 days for COPD exacerbation  [facility] Long Term Goal Start Date  12/14/17  Interventions for Problem One Long Term Goal  RN discussed the zones and action plan of COPD. RN discussed taking medication as prescription as ordered. Patient will follow up with appointment.  [facility] CM Short Term Goal #1   Patient will be able to describe the yellow zone and action plan within the next 30 days  [facility] CM Short Term Goal #1 Start Date  12/16/17  Interventions for Short Term Goal #1  RN disussed the yellow zone and action plan. RN sent a COPD packet with action zones and refrigerator magnet. RN will follow up for further discussion and teach back   [facility] CM Short Term Goal #2   Patient will be able to verbalize what purselip breathing is within the next 30 days  [facility] CM Short Term Goal #2 Start Date  12/16/17  Interventions for Short Term Goal #2  RN discussed purselip breathing. RN sent educational material on purselip breathing. RN will follow up with further discussion and teach back      Assessment:  Patient is currently trying to stop smoking Patient has not heard of COPD zones and action plan Patient has not heard of purse lip breathing Patient will benefit from Health Coach telephonic outreach for education and support for COPD self management.   Plan:  RN discussed zones and action plan of COPD RN discussed purse lip breathing RN sent EMMI on COPD exacerbation RN sent educational material on purse lip breathing RN sent COPD packet RN sent 2017 COPD Calendar RN will follow up within the month of August RN sentt barriers letter and assessment to PCP  Gean Maidens BSN RN [facility] Care Management [phone]

## 2017-12-16 NOTE — Telephone Encounter (Signed)
Patient was called and informed of lab results and mammogram results. 

## 2017-12-19 ENCOUNTER — Ambulatory Visit: Admission: RE | Admit: 2017-12-19 | Payer: Medicare Other | Source: Ambulatory Visit

## 2017-12-19 ENCOUNTER — Encounter: Payer: Self-pay | Admitting: Acute Care

## 2017-12-19 ENCOUNTER — Ambulatory Visit (INDEPENDENT_AMBULATORY_CARE_PROVIDER_SITE_OTHER): Payer: Medicare Other | Admitting: Acute Care

## 2017-12-19 DIAGNOSIS — F17210 Nicotine dependence, cigarettes, uncomplicated: Secondary | ICD-10-CM | POA: Diagnosis not present

## 2017-12-19 NOTE — Progress Notes (Signed)
Shared Decision Making Visit Lung Cancer Screening Program [phone])   Eligibility:  Age 57 y.o.  Pack Years Smoking History Calculation 47 pack year smoking history (# packs/per year x # years smoked)  Recent History of coughing up blood  no  Unexplained weight loss? no ( >Than 15 pounds within the last 6 months )  Prior History Lung / other cancer no (Diagnosis within the last 5 years already requiring surveillance chest CT Scans).  Smoking Status Current Smoker  Former Smokers: Years since quit: NA  Quit Date: NA  Visit Components:  Discussion included one or more decision making aids. yes  Discussion included risk/benefits of screening. yes  Discussion included potential follow up diagnostic testing for abnormal scans. yes  Discussion included meaning and risk of over diagnosis. yes  Discussion included meaning and risk of False Positives. yes  Discussion included meaning of total radiation exposure. yes  Counseling Included:  Importance of adherence to annual lung cancer LDCT screening. yes  Impact of comorbidities on ability to participate in the program. yes  Ability and willingness to under diagnostic treatment. yes  Smoking Cessation Counseling:  Current Smokers:   Discussed importance of smoking cessation. yes  Information about tobacco cessation classes and interventions provided to patient. yes  Patient provided with "ticket" for LDCT Scan. yes  Symptomatic Patient. no  Counseling  Diagnosis Code: Tobacco Use Z72.0  Asymptomatic Patient yes  Counseling (Intermediate counseling: > three minutes counseling) W0981  Former Smokers:   Discussed the importance of maintaining cigarette abstinence. yes  Diagnosis Code: Personal History of Nicotine Dependence. X91.478  Information about tobacco cessation classes and interventions provided to patient. Yes  Patient provided with "ticket" for LDCT Scan. yes  Written Order for Lung Cancer  Screening with LDCT placed in Epic. Yes (CT Chest Lung Cancer Screening Low Dose W/O CM) [id number] Z12.2-Screening of respiratory organs Z87.891-Personal history of nicotine dependence  I have spent 25 minutes of face to face time with Maria Oliver discussing the risks and benefits of lung cancer screening. We viewed a power point together that explained in detail the above noted topics. We paused at intervals to allow for questions to be asked and answered to ensure understanding.We discussed that the single most powerful action that she can take to decrease her risk of developing lung cancer is to quit smoking. We discussed whether or not she is ready to commit to setting a quit date. We discussed options for tools to aid in quitting smoking including nicotine replacement therapy, non-nicotine medications, support groups, Quit Smart classes, and behavior modification. We discussed that often times setting smaller, more achievable goals, such as eliminating 1 cigarette a day for a week and then 2 cigarettes a day for a week can be helpful in slowly decreasing the number of cigarettes smoked. This allows for a sense of accomplishment as well as providing a clinical benefit. I gave her the " Be Stronger Than Your Excuses" card with contact information for community resources, classes, free nicotine replacement therapy, and access to mobile apps, text messaging, and on-line smoking cessation help. I have also given her my card and contact information in the event she needs to contact me. We discussed the time and location of the scan, and that either [name] RN or I will call with the results within 24-48 hours of receiving them. I have offered her  a copy of the power point we viewed  as a resource in the event they need reinforcement of  the concepts we discussed today in the office. The patient verbalized understanding of all of  the above and had no further questions upon leaving the office. They have my  contact information in the event they have any further questions.  I spent 3 minutes counseling on smoking cessation and the health risks of continued tobacco abuse.  I explained to the patient that there has been a high incidence of coronary artery disease noted on these exams. I explained that this is a non-gated exam therefore degree or severity cannot be determined. This patient is not currently on statin therapy. I have asked the patient to follow-up with their PCP regarding any incidental finding of coronary artery disease and management with diet or medication as their PCP  feels is clinically indicated. The patient verbalized understanding of the above and had no further questions upon completion of the visit.      Maria NgoSarah F Nasia Cannan, NP 12/19/2017 2:46 PM

## 2017-12-22 ENCOUNTER — Ambulatory Visit (INDEPENDENT_AMBULATORY_CARE_PROVIDER_SITE_OTHER)
Admission: RE | Admit: 2017-12-22 | Discharge: 2017-12-22 | Disposition: A | Discharge status: Home / Self Care | Payer: Medicare Other | Source: Ambulatory Visit | Attending: Acute Care | Admitting: Acute Care

## 2017-12-22 DIAGNOSIS — F17210 Nicotine dependence, cigarettes, uncomplicated: Secondary | ICD-10-CM

## 2017-12-22 DIAGNOSIS — Z122 Encounter for screening for malignant neoplasm of respiratory organs: Secondary | ICD-10-CM

## 2017-12-27 ENCOUNTER — Other Ambulatory Visit: Payer: Self-pay | Admitting: Acute Care

## 2017-12-27 DIAGNOSIS — F17210 Nicotine dependence, cigarettes, uncomplicated: Secondary | ICD-10-CM

## 2017-12-27 DIAGNOSIS — Z122 Encounter for screening for malignant neoplasm of respiratory organs: Secondary | ICD-10-CM

## 2017-12-30 ENCOUNTER — Other Ambulatory Visit: Payer: Self-pay | Admitting: Nurse Practitioner

## 2017-12-30 ENCOUNTER — Ambulatory Visit: Payer: Medicare Other | Attending: Family Medicine | Admitting: *Deleted

## 2017-12-30 VITALS — BP 165/104 | HR 74 | Temp 98.2°F | Resp 20 | Ht 64.0 in | Wt 137.6 lb

## 2017-12-30 DIAGNOSIS — R300 Dysuria: Secondary | ICD-10-CM | POA: Insufficient documentation

## 2017-12-30 DIAGNOSIS — R350 Frequency of micturition: Secondary | ICD-10-CM | POA: Diagnosis not present

## 2017-12-30 DIAGNOSIS — R399 Unspecified symptoms and signs involving the genitourinary system: Secondary | ICD-10-CM

## 2017-12-30 DIAGNOSIS — N390 Urinary tract infection, site not specified: Secondary | ICD-10-CM | POA: Diagnosis present

## 2017-12-30 LAB — POCT URINALYSIS DIPSTICK
Glucose, UA: NEGATIVE
NITRITE UA: NEGATIVE
Odor: POSITIVE
PH UA: 5.5 (ref 5.0–8.0)
Protein, UA: POSITIVE — AB
UROBILINOGEN UA: 0.2 U/dL

## 2017-12-30 NOTE — Progress Notes (Signed)
Patient Triage Assessment Form Todays Date: 12/30/2017 Name: Maria Oliver, Maria Oliver DOB: 16-Aug-1960 Reason for walkin: UTI sx's.When did your symptoms start?  2 days ago Please list symptoms:painful urination: burning and frequent Are you having pain: yes   Assessment Vital Signs: on flowsheet T: P: R: SpO2 BP:   Plan: verbal from Bertram DenverZelda Fleming, FNP send urinalysis for culture Take OTC AZO Pt advised to drink water and cranberry juice Will call with results Chart routed to Saralyn PilarZelda Fleming,FNP and pt PCP.

## 2018-01-01 ENCOUNTER — Other Ambulatory Visit: Payer: Self-pay | Admitting: Nurse Practitioner

## 2018-01-01 LAB — URINE CULTURE

## 2018-01-01 MED ORDER — NITROFURANTOIN MONOHYD MACRO 100 MG PO CAPS: 100.0000 mg | ORAL_CAPSULE | Freq: Two times a day (BID) | ORAL | 0 refills | Status: AC

## 2018-01-02 ENCOUNTER — Telehealth: Payer: Self-pay

## 2018-01-02 NOTE — Telephone Encounter (Signed)
CMA spoke to patient to inform on urine culture and Rx sent.   Patient verified DOB. Patient understood.

## 2018-01-02 NOTE — Telephone Encounter (Signed)
-----   Message from Claiborne RiggZelda W Fleming, NP sent at 01/01/2018  9:50 PM EDT ----- Urine culture shows bacteria. I have sent a 5 day antibiotic to the pharmacy for you to take.

## 2018-01-03 ENCOUNTER — Other Ambulatory Visit: Payer: Self-pay | Admitting: Family Medicine

## 2018-01-03 DIAGNOSIS — R7989 Other specified abnormal findings of blood chemistry: Secondary | ICD-10-CM

## 2018-01-03 DIAGNOSIS — R945 Abnormal results of liver function studies: Principal | ICD-10-CM

## 2018-01-05 ENCOUNTER — Telehealth: Payer: Self-pay | Admitting: Family Medicine

## 2018-01-05 NOTE — Telephone Encounter (Signed)
Patent came in stating that the lisinopril isn't working. Please fu with patient [phone]

## 2018-01-06 MED ORDER — LISINOPRIL 10 MG PO TABS: 10.0000 mg | ORAL_TABLET | Freq: Every day | ORAL | 1 refills | Status: DC

## 2018-01-06 NOTE — Telephone Encounter (Signed)
Sent a prescription for an increased dose of lisinopril (10 mg) to her pharmacy

## 2018-01-06 NOTE — Telephone Encounter (Signed)
Please schedule her for an appointment.  Thank you

## 2018-01-06 NOTE — Telephone Encounter (Signed)
Patient stated she had already been taken two pills and it is still high and she feels swim headed

## 2018-01-06 NOTE — Telephone Encounter (Signed)
Tried to call patient no answer. 

## 2018-01-13 ENCOUNTER — Other Ambulatory Visit: Payer: Self-pay | Admitting: *Deleted

## 2018-01-13 NOTE — Patient Outreach (Signed)
[facility]) Care Management  01/13/2018  Maria Oliver Feb 08, 1961 [id number]   RN Health Coach attempted #1 follow up outreach call to patient.  Patient was unavailable. HIPPA compliance voicemail message left with return callback number.  Plan: RN will call patient again within 10 business days.  Maria Oliver BSN RN [facility] Care Management [phone]

## 2018-01-25 ENCOUNTER — Other Ambulatory Visit: Payer: Self-pay | Admitting: *Deleted

## 2018-01-27 NOTE — Patient Outreach (Signed)
[facility]) Maria Oliver  01/27/2018   IMONI KOHEN 1960/10/18 [id number]  RN Health Coach telephone call to patient.  [employer] compliance verified. Per patient she is doing good. Patient stated she is staying in the house with the air conditioning on. Per patient she is currently still smoking. Patient is using inhalers as per ordered. Patient stated she does have a productive cough but it is clear sputum. Patient is in the green zone. Patient has agreed to follow up outreach calls.   Current Medications:  Current Outpatient Medications  Medication Sig Dispense Refill  . albuterol (VENTOLIN HFA) 108 (90 Base) MCG/ACT inhaler Inhale 2 puffs into the lungs every 6 (six) hours as needed for wheezing or shortness of breath. 18 g 3  . cholecalciferol (VITAMIN D) 1000 units tablet Take 1,000 Units by mouth every other day.     . dicyclomine (BENTYL) 20 MG tablet Take 1 tablet (20 mg total) by mouth 2 (two) times daily. 20 tablet 0  . ibuprofen (ADVIL,MOTRIN) 200 MG tablet Take 400 mg by mouth every 6 (six) hours as needed for mild pain.    Marland Kitchen lisinopril (PRINIVIL,ZESTRIL) 10 MG tablet Take 1 tablet (10 mg total) by mouth daily. 30 tablet 1  . mometasone-formoterol (DULERA) 200-5 MCG/ACT AERO Inhale 2 puffs into the lungs 2 (two) times daily. 1 Inhaler 5  . Multiple Vitamin (MULTIVITAMIN WITH MINERALS) TABS tablet Take 1 tablet by mouth every other day.    . ondansetron (ZOFRAN ODT) 4 MG disintegrating tablet Take 1 tablet (4 mg total) by mouth every 8 (eight) hours as needed for nausea or vomiting. 10 tablet 0  . Tiotropium Bromide Monohydrate (SPIRIVA RESPIMAT) 2.5 MCG/ACT AERS Inhale 2 puffs into the lungs daily. 1 Inhaler 5  . traMADol (ULTRAM) 50 MG tablet Take 1 tablet (50 mg total) by mouth every 6 (six) hours as needed. 15 tablet 0   No current facility-administered medications for this visit.     Functional Status:  In your present state of health, do you have any  difficulty performing the following activities: 12/13/2017  Hearing? N  Vision? N  Difficulty concentrating or making decisions? N  Walking or climbing stairs? Y  Dressing or bathing? N  Doing errands, shopping? Y  Preparing Food and eating ? N  Using the Toilet? N  In the past six months, have you accidently leaked urine? N  Do you have problems with loss of bowel control? N  Managing your Medications? N  Managing your Finances? N  Housekeeping or managing your Housekeeping? N  Some recent data might be hidden    Fall/Depression Screening: Fall Risk  01/26/2018 12/13/2017 11/02/2017  Falls in the past year? No No No  Number falls in past yr: - - -  Injury with Fall? - - -   PHQ 2/9 Scores 01/26/2018 12/13/2017 11/28/2017 11/02/2017 06/01/2017 01/07/2017 11/19/2016  PHQ - 2 Score _0 PHQ- 9 Score _1 [facility] CM Maria Plan Problem One     Most Recent Value  Maria Plan Problem One  Knowledge Deficit in Self Oliver of COPD  Role Documenting the Problem One  [place] for Problem One  Active  [facility] Long Term Goal   Patient with not have any readmission within the next 90 days for COPD exacerbation  [facility] Long Term Goal Start Date  01/25/18  Interventions for Problem One Long  Term Goal  RN reiteratred the zones and action plan of COPD. RN reiterated medication adherence. RN will reiterate with each follow up   [facility] CM Short Term Goal #1   Patient will be able to describe the yellow zone and action plan within the next 30 days  [facility] CM Short Term Goal #1 Met Date  01/25/18  [facility] CM Short Term Goal #2   Patient will be able to verbalize what purselip breathing is within the next 30 days  Interventions for Short Term Goal #2  Patient has received the information but has not read or tried it. RN will follow up with further teachback  [facility] CM Short Term Goal #3  Patient will follow up with Health Maintenance witihin the next 30 days  [facility]N CM Short Term Goal #3 Start Date   01/27/18  Interventions for Short Tern Goal #3  RN discussed the imprtance of getting flu shot. Per patient she will follow up with getting shot before next outreach.   [facility]N CM Short Term Goal #4  Patient will be able to have a better understanding of cold symptoms, flu symtoms and pneumonia  [facility]N CM Short Term Goal #4 Start Date  01/27/18  Interventions for Short Term Goal #4  RN discussed getting flu shot. RN sent educational material on cold, flu, pneumonia. RN will follow up with furtther discussion and teach back      Assessment:  Patient is in the green zone Productive cough clear sputum NO COPD exacerbation  Plan:  RN discussed COPD zones RN discussed getting flu shot RN sent EMMI educational material on cold,flu, and pneumonia RN will follow up within the month of October  Marieke Lubke Sidman Oliver [phone]

## 2018-01-31 ENCOUNTER — Encounter: Payer: Self-pay | Admitting: Pulmonary Disease

## 2018-01-31 ENCOUNTER — Ambulatory Visit (INDEPENDENT_AMBULATORY_CARE_PROVIDER_SITE_OTHER): Payer: Medicare Other | Admitting: Pulmonary Disease

## 2018-01-31 VITALS — BP 130/84 | HR 66 | Ht 61.81 in | Wt 139.2 lb

## 2018-01-31 DIAGNOSIS — F17210 Nicotine dependence, cigarettes, uncomplicated: Secondary | ICD-10-CM | POA: Diagnosis not present

## 2018-01-31 DIAGNOSIS — Z720 Tobacco use: Secondary | ICD-10-CM | POA: Diagnosis not present

## 2018-01-31 DIAGNOSIS — R0600 Dyspnea, unspecified: Secondary | ICD-10-CM

## 2018-01-31 DIAGNOSIS — J432 Centrilobular emphysema: Secondary | ICD-10-CM

## 2018-01-31 NOTE — Patient Instructions (Signed)
Tobacco use: Stop smoking Call 1 [phone] quit now to get free information from the state of [place] Keep the appointment for the one year CT scan to screen for lung cancer  COPD: I am concerned that this may be worsening. We will check spirometry testing today Continue Dulera Continue Spiriva I recommend that she try to get the flu-cell vaccine which is a influenza vaccine made without using eggs Quit smoking Practice good hand hygiene Stay active  Follow-up in 6 months or sooner if need

## 2018-01-31 NOTE — Progress Notes (Signed)
Subjective:    Patient ID: Maria Oliver, female    DOB: 07-Jan-1961, 57 y.o.   MRN: [id number]   Synopsis: Former patient of Dr. Shelle Ironlance who has COPD.  She was referred to [facility] in 2016 after having pneumonia. CXR 06/2014: hyperinflation, prominent BV markings, no acute process PFT's 07/2014:  FEV1 1.63 (63%), ratio 66, 19% increase with BD, +airtrapping, normal TLC, DLCO 54% Still smoking as of August 2019.  HPI Chief Complaint  Patient presents with  . Follow-up    MRI   Maria Oliver was sick back in June and came down with a COPD exacerbation.  She says the prednisone and doxycycline helped.  She still produces a little clear mucus.  She says that the hot weather makes it harder for her to breathe.  She is still smoking about 1/2 pack pre day.  She has not tried to quit smoking.    She says that she feels short of breath when she "rushes around" so she will try to pace herself.  She notes that diesel exhaust make her feel more short of breath.  She says that she is supposed to have an MRI of her brain soon.  Past Medical History:  Diagnosis Date  . Abdominal pain   . Anxiety   . Arthritis   . Asthma   . Chronic cholecystitis with calculus s/p lap cholecystectomy 09/09/2016 09/09/2016  . Complication of anesthesia    slow to arouse post operatively   . COPD (chronic obstructive pulmonary disease) (HCC)   . 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 Generalized headaches   . Hernia, inguinal, right   . Hypertension   . Leg swelling       Review of Systems  Constitutional: Negative for chills, fatigue and fever.  HENT: Negative for postnasal drip, rhinorrhea and sinus pressure.   Respiratory: Positive for cough and shortness of breath. Negative for wheezing.   Cardiovascular: Negative for chest pain, palpitations and leg swelling.       Objective:   Physical Exam Vitals:   01/31/18 1354  BP: 130/84  Pulse: 66  SpO2: 96%    Weight: 139 lb 3.2 oz (63.1 kg)  Height: 5' 1.81" (1.57 m)   room air  Gen: chronically ill appearing HENT: OP clear, TM's clear, neck supple PULM: CTA B, normal percussion CV: RRR, no mgr, trace edema GI: BS+, soft, nontender Derm: no cyanosis or rash Psyche: normal mood and affect    Records from the last visit with Jasmine Aweob Byrum reviewed where Spiriva was added and she was treated for a COPD exacerbation  Chest imaging: 12/2017 CT chest lung cancer screening: RADS score 2S, some aortic atherosclerosis noted, centrilobular emphysema noted CXR 06/2014: hyperinflation, prominent BV markings, no acute process  PFT PFT's 07/2014:  FEV1 1.63 (63%), ratio 66, 19% increase with BD, +airtrapping, normal TLC, DLCO 54% August 2019 spirometry ratio 61%, FEV1 1.73 L 73% predicted  CBC    Component Value Date/Time   WBC 8.0 11/22/2017 1244   RBC 4.82 11/22/2017 1244   HGB 13.7 11/22/2017 1244   HGB 14.3 11/02/2017 1621   HCT 42.0 11/22/2017 1244   HCT 44.0 11/02/2017 1621   PLT 283 11/22/2017 1244   PLT 375 11/02/2017 1621   MCV 87.1 11/22/2017 1244   MCV 87 11/02/2017 1621   MCH 28.4 11/22/2017 1244   MCHC 32.6 11/22/2017 1244   RDW 13.9 11/22/2017 1244   RDW 14.6 11/02/2017 1621  LYMPHSABS 2.5 11/02/2017 1621   MONOABS 0.6 09/02/2016 1955   EOSABS 0.3 11/02/2017 1621   BASOSABS 0.0 11/02/2017 1621        Assessment & Plan:   Dyspnea, unspecified type - Plan: Spirometry with Graph, CANCELED: Spirometry with graph  Moderate smoker (20 or less per day)  Centrilobular emphysema (HCC)  Tobacco abuse  Discussion: Maria Oliver has been struggling with shortness of breath and recurrent exacerbations of COPD because she chooses to continue to smoke cigarettes.  We talked about the fact that this makes the COPD worse and causes recurrent exacerbations.  She has made no real effort at quitting smoking.  Plan: Tobacco use: Stop smoking Call 1 800 quit now to get free information from  the state of [place] Keep the appointment for the one year CT scan to screen for lung cancer  COPD: I am concerned that this may be worsening. We will check spirometry testing today Continue Dulera Continue Spiriva I recommend that she try to get the flu-cell vaccine which is a influenza vaccine made without using eggs Quit smoking Practice good hand hygiene Stay active  Follow-up in 6 months or sooner if need   Current Outpatient Medications:  .  albuterol (VENTOLIN HFA) 108 (90 Base) MCG/ACT inhaler, Inhale 2 puffs into the lungs every 6 (six) hours as needed for wheezing or shortness of breath., Disp: 18 g, Rfl: 3 .  cholecalciferol (VITAMIN D) 1000 units tablet, Take 1,000 Units by mouth every other day. , Disp: , Rfl:  .  dicyclomine (BENTYL) 20 MG tablet, Take 1 tablet (20 mg total) by mouth 2 (two) times daily., Disp: 20 tablet, Rfl: 0 .  ibuprofen (ADVIL,MOTRIN) 200 MG tablet, Take 400 mg by mouth every 6 (six) hours as needed for mild pain., Disp: , Rfl:  .  lisinopril (PRINIVIL,ZESTRIL) 10 MG tablet, Take 1 tablet (10 mg total) by mouth daily., Disp: 30 tablet, Rfl: 1 .  mometasone-formoterol (DULERA) 200-5 MCG/ACT AERO, Inhale 2 puffs into the lungs 2 (two) times daily., Disp: 1 Inhaler, Rfl: 5 .  Multiple Vitamin (MULTIVITAMIN WITH MINERALS) TABS tablet, Take 1 tablet by mouth every other day., Disp: , Rfl:  .  Tiotropium Bromide Monohydrate (SPIRIVA RESPIMAT) 2.5 MCG/ACT AERS, Inhale 2 puffs into the lungs daily., Disp: 1 Inhaler, Rfl: 5 .  traMADol (ULTRAM) 50 MG tablet, Take 1 tablet (50 mg total) by mouth every 6 (six) hours as needed., Disp: 15 tablet, Rfl: 0 .  ondansetron (ZOFRAN ODT) 4 MG disintegrating tablet, Take 1 tablet (4 mg total) by mouth every 8 (eight) hours as needed for nausea or vomiting. (Patient not taking: Reported on 01/31/2018), Disp: 10 tablet, Rfl: 0

## 2018-02-06 ENCOUNTER — Ambulatory Visit: Payer: Medicare Other | Attending: Family Medicine | Admitting: Family Medicine

## 2018-02-06 ENCOUNTER — Encounter: Payer: Self-pay | Admitting: Family Medicine

## 2018-02-06 VITALS — BP 152/83 | HR 64 | Temp 97.9°F | Ht 64.0 in | Wt 138.0 lb

## 2018-02-06 DIAGNOSIS — Z885 Allergy status to narcotic agent status: Secondary | ICD-10-CM | POA: Diagnosis not present

## 2018-02-06 DIAGNOSIS — J449 Chronic obstructive pulmonary disease, unspecified: Secondary | ICD-10-CM | POA: Insufficient documentation

## 2018-02-06 DIAGNOSIS — M545 Low back pain: Secondary | ICD-10-CM | POA: Diagnosis not present

## 2018-02-06 DIAGNOSIS — Z886 Allergy status to analgesic agent status: Secondary | ICD-10-CM | POA: Insufficient documentation

## 2018-02-06 DIAGNOSIS — Z881 Allergy status to other antibiotic agents status: Secondary | ICD-10-CM | POA: Insufficient documentation

## 2018-02-06 DIAGNOSIS — Z79899 Other long term (current) drug therapy: Secondary | ICD-10-CM | POA: Insufficient documentation

## 2018-02-06 DIAGNOSIS — Z880 Allergy status to penicillin: Secondary | ICD-10-CM | POA: Diagnosis not present

## 2018-02-06 DIAGNOSIS — F419 Anxiety disorder, unspecified: Secondary | ICD-10-CM | POA: Diagnosis not present

## 2018-02-06 DIAGNOSIS — B351 Tinea unguium: Secondary | ICD-10-CM | POA: Insufficient documentation

## 2018-02-06 DIAGNOSIS — Z9049 Acquired absence of other specified parts of digestive tract: Secondary | ICD-10-CM | POA: Diagnosis not present

## 2018-02-06 DIAGNOSIS — J432 Centrilobular emphysema: Secondary | ICD-10-CM | POA: Diagnosis not present

## 2018-02-06 DIAGNOSIS — M1990 Unspecified osteoarthritis, unspecified site: Secondary | ICD-10-CM | POA: Diagnosis not present

## 2018-02-06 DIAGNOSIS — G8929 Other chronic pain: Secondary | ICD-10-CM | POA: Diagnosis not present

## 2018-02-06 DIAGNOSIS — I10 Essential (primary) hypertension: Secondary | ICD-10-CM | POA: Diagnosis not present

## 2018-02-06 MED ORDER — LISINOPRIL 10 MG PO TABS: 10.0000 mg | ORAL_TABLET | Freq: Every day | ORAL | 6 refills | Status: DC

## 2018-02-06 MED ORDER — DOXYCYCLINE HYCLATE 100 MG PO TABS: 100.0000 mg | ORAL_TABLET | Freq: Two times a day (BID) | ORAL | 0 refills | Status: DC

## 2018-02-06 NOTE — Progress Notes (Signed)
Subjective:  Patient ID: Maria Oliver, female    DOB: 09-Jan-1961  Age: 57 y.o. MRN: [id number]  CC: Hypertension   HPI [name] is a 57 year old female with a history of hypertension, COPD, chronic low back pain, tobacco abuse who presents today for a follow-up visit. She is requesting a podiatry referral as she has a right big toenail which needs to come off.  She tried medications for "toenail fungus" in the past with no relief in symptoms.  She is beginning to experience redness, pain and swelling of the big toe especially when she wears closed toed shoes.  Denies fever. Her blood pressure is elevated and she endorses compliance with her antihypertensive. She informs me she has an appointment with neurosurgery later this week due to the scalp swelling. She had her visit with her pulmonologist Dr. Kendrick Fries last week with no recent regimen changes. With regards to her healthcare maintenance she is due for repeat colonoscopy but no showed her appointment because she was scared as she had complications the last time with a colonoscopy but promises to schedule an appointment soon.  Past Medical History:  Diagnosis Date  . Abdominal pain   . Anxiety   . Arthritis   . Asthma   . Chronic cholecystitis with calculus s/p lap cholecystectomy 09/09/2016 09/09/2016  . Complication of anesthesia    slow to arouse post operatively   . COPD (chronic obstructive pulmonary disease) (HCC)   . 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 Generalized headaches   . Hernia, inguinal, right   . Hypertension   . Leg swelling     Past Surgical History:  Procedure Laterality Date  . COLON SURGERY  2010   per patient "colon take down"  . COLOSTOMY  2010  . LAPAROSCOPIC CHOLECYSTECTOMY SINGLE SITE WITH INTRAOPERATIVE CHOLANGIOGRAM N/A 09/09/2016   Procedure: LAPAROSCOPIC CHOLECYSTECTOMY  WITH INTRAOPERATIVE CHOLANGIOGRAM;  Surgeon: Karie Soda, MD;  Location: [facility];  Service: General;  Laterality: N/A;  . TUBAL LIGATION      Allergies  Allergen Reactions  . Aspirin Other (See Comments)    REACTION: GI upset/Hernia   . Ciprofloxacin Other (See Comments)    Tendonitis, joint pain, and nausea.  "Interfered with her HCTZ" (also)  . Codeine Nausea And Vomiting and Other (See Comments)    (Also) GI upset/Hernia (CAN TOLERATE DILAUDID & MORPHINE)  . Eggs Or Egg-Derived Products Nausea Only    Stomach pains  . Fentanyl Nausea Only    Tolerates Dilaudid or morphine much better  . Hydrocodone Nausea And Vomiting  . Lactose Intolerance (Gi) Nausea And Vomiting and Other (See Comments)    (Also) vertigo  . Oxycodone Nausea And Vomiting    Tolerates hydromorphone better  . Penicillins Rash    Has patient had a PCN reaction causing immediate rash, facial/tongue/throat swelling, SOB or lightheadedness with hypotension: Yes Has patient had a PCN reaction causing severe rash involving mucus membranes or skin necrosis: No Has patient had a PCN reaction that required hospitalization No Has patient had a PCN reaction occurring within the last 10 years: No If all of the above answers are "NO", then may proceed with Cephalosporin use.      Outpatient Medications Prior to Visit  Medication Sig Dispense Refill  . albuterol (VENTOLIN HFA) 108 (90 Base) MCG/ACT inhaler Inhale 2 puffs into the lungs every 6 (six) hours as needed for wheezing or shortness of breath. 18 g 3  . dicyclomine (  BENTYL) 20 MG tablet Take 1 tablet (20 mg total) by mouth 2 (two) times daily. 20 tablet 0  . ibuprofen (ADVIL,MOTRIN) 200 MG tablet Take 400 mg by mouth every 6 (six) hours as needed for mild pain.    . mometasone-formoterol (DULERA) 200-5 MCG/ACT AERO Inhale 2 puffs into the lungs 2 (two) times daily. 1 Inhaler 5  . Multiple Vitamin (MULTIVITAMIN WITH MINERALS) TABS tablet Take 1 tablet by mouth every other day.    . Tiotropium Bromide Monohydrate (SPIRIVA RESPIMAT) 2.5  MCG/ACT AERS Inhale 2 puffs into the lungs daily. 1 Inhaler 5  . traMADol (ULTRAM) 50 MG tablet Take 1 tablet (50 mg total) by mouth every 6 (six) hours as needed. 15 tablet 0  . lisinopril (PRINIVIL,ZESTRIL) 10 MG tablet Take 1 tablet (10 mg total) by mouth daily. 30 tablet 1  . cholecalciferol (VITAMIN D) 1000 units tablet Take 1,000 Units by mouth every other day.     . ondansetron (ZOFRAN ODT) 4 MG disintegrating tablet Take 1 tablet (4 mg total) by mouth every 8 (eight) hours as needed for nausea or vomiting. (Patient not taking: Reported on 01/31/2018) 10 tablet 0   No facility-administered medications prior to visit.     ROS Review of Systems  Constitutional: Negative for activity change, appetite change and fatigue.  HENT: Negative for congestion, sinus pressure and sore throat.   Eyes: Negative for visual disturbance.  Respiratory: Negative for cough, chest tightness, shortness of breath and wheezing.   Cardiovascular: Negative for chest pain and palpitations.  Gastrointestinal: Negative for abdominal distention, abdominal pain and constipation.  Endocrine: Negative for polydipsia.  Genitourinary: Negative for dysuria and frequency.  Musculoskeletal: Negative for arthralgias and back pain.  Skin: Negative for rash.  Neurological: Negative for tremors, light-headedness and numbness.  Hematological: Does not bruise/bleed easily.  Psychiatric/Behavioral: Negative for agitation and behavioral problems.    Objective:  BP (!) 152/83   Pulse 64   Temp 97.9 F (36.6 C) (Oral)   Ht 5\' 4"  (1.626 m)   Wt 138 lb (62.6 kg)   SpO2 97%   BMI 23.69 kg/m   BP/Weight 02/06/2018 01/31/2018 12/30/2017  Systolic BP 152 130 165  Diastolic BP 83 84 104  Wt. (Lbs) 138 139.2 137.6  BMI 23.69 25.62 23.62      Physical Exam  Constitutional: She is oriented to person, place, and time. She appears well-developed and well-nourished.  Cardiovascular: Normal rate, normal heart sounds and intact  distal pulses.  No murmur heard. Pulmonary/Chest: Effort normal and breath sounds normal. She has no wheezes. She has no rales. She exhibits no tenderness.  Abdominal: Soft. Bowel sounds are normal. She exhibits mass (large ventral hernia). She exhibits no distension. There is no tenderness.  Musculoskeletal: Normal range of motion.  Neurological: She is alert and oriented to person, place, and time.  Skin:  Right big toe onychomycosis with inflammation and tenderness in nail cuticle and medially  Psychiatric: She has a normal mood and affect.     Assessment & Plan:   1. Onychomycosis of right great toe Evidence of inflammation and nail cuticle We will place an antibiotic; she is penicillin allergic -recent doxycycline - Ambulatory referral to Podiatry - doxycycline (VIBRA-TABS) 100 MG tablet; Take 1 tablet (100 mg total) by mouth 2 (two) times daily.  Dispense: 20 tablet; Refill: 0  2. Essential hypertension Uncontrolled No regimen change at this time Counseled on blood pressure goal of less than 130/80, low-sodium, DASH diet, medication compliance,  150 minutes of moderate intensity exercise per week. Discussed medication compliance, adverse effects. - lisinopril (PRINIVIL,ZESTRIL) 10 MG tablet; Take 1 tablet (10 mg total) by mouth daily.  Dispense: 30 tablet; Refill: 6  3. Centrilobular emphysema (HCC) Currently on Dulera and Ventolin Advised on smoking cessation   Meds ordered this encounter  Medications  . lisinopril (PRINIVIL,ZESTRIL) 10 MG tablet    Sig: Take 1 tablet (10 mg total) by mouth daily.    Dispense:  30 tablet    Refill:  6  . doxycycline (VIBRA-TABS) 100 MG tablet    Sig: Take 1 tablet (100 mg total) by mouth 2 (two) times daily.    Dispense:  20 tablet    Refill:  0    Follow-up: Return in about 6 months (around 08/09/2018) for Follow-up of chronic medical conditions.   Hoy RegisterEnobong Brittnay Pigman MD

## 2018-02-06 NOTE — Progress Notes (Signed)
Patient needs referral to Podiatry for toe nail removal.

## 2018-02-07 ENCOUNTER — Encounter: Payer: Self-pay | Admitting: Family Medicine

## 2018-02-10 ENCOUNTER — Ambulatory Visit (INDEPENDENT_AMBULATORY_CARE_PROVIDER_SITE_OTHER): Payer: Medicare Other | Admitting: Podiatry

## 2018-02-10 ENCOUNTER — Encounter: Payer: Self-pay | Admitting: Podiatry

## 2018-02-10 DIAGNOSIS — B351 Tinea unguium: Secondary | ICD-10-CM | POA: Diagnosis not present

## 2018-02-10 DIAGNOSIS — M79674 Pain in right toe(s): Secondary | ICD-10-CM | POA: Diagnosis not present

## 2018-02-10 NOTE — Patient Instructions (Signed)

## 2018-02-11 NOTE — Progress Notes (Signed)
Subjective:   Patient ID: Madie RenoEllen D Basinski, female   DOB: 57 y.o.   MRN: [id number]   HPI 57 year old female presents the office today for concerns of right big toe thickening discoloration.  Is not a chronic issue for her for some time.  She is tried trimming the nail she is tried antifungal medicine without any significant improvement.  She states that she would like to have the toenail removed permanently given the ongoing symptoms.  She was given doxycycline but will call her care physician.  She has no other concerns today.   Review of Systems  All other systems reviewed and are negative.  Past Medical History:  Diagnosis Date  . Abdominal pain   . Anxiety   . Arthritis   . Asthma   . Chronic cholecystitis with calculus s/p lap cholecystectomy 09/09/2016 09/09/2016  . Complication of anesthesia    slow to arouse post operatively   . COPD (chronic obstructive pulmonary disease) (HCC)   . 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 Generalized headaches   . Hernia, inguinal, right   . Hypertension   . Leg swelling     Past Surgical History:  Procedure Laterality Date  . COLON SURGERY  2010   per patient "colon take down"  . COLOSTOMY  2010  . LAPAROSCOPIC CHOLECYSTECTOMY SINGLE SITE WITH INTRAOPERATIVE CHOLANGIOGRAM N/A 09/09/2016   Procedure: LAPAROSCOPIC CHOLECYSTECTOMY  WITH INTRAOPERATIVE CHOLANGIOGRAM;  Surgeon: Karie SodaSteven Gross, MD;  Location: [facility];  Service: General;  Laterality: N/A;  . TUBAL LIGATION       Current Outpatient Medications:  .  albuterol (VENTOLIN HFA) 108 (90 Base) MCG/ACT inhaler, Inhale 2 puffs into the lungs every 6 (six) hours as needed for wheezing or shortness of breath., Disp: 18 g, Rfl: 3 .  cholecalciferol (VITAMIN D) 1000 units tablet, Take 1,000 Units by mouth every other day. , Disp: , Rfl:  .  dicyclomine (BENTYL) 20 MG tablet, Take 1 tablet (20 mg total) by mouth 2 (two) times daily., Disp: 20 tablet, Rfl:  0 .  doxycycline (VIBRA-TABS) 100 MG tablet, Take 1 tablet (100 mg total) by mouth 2 (two) times daily., Disp: 20 tablet, Rfl: 0 .  ibuprofen (ADVIL,MOTRIN) 200 MG tablet, Take 400 mg by mouth every 6 (six) hours as needed for mild pain., Disp: , Rfl:  .  lisinopril (PRINIVIL,ZESTRIL) 10 MG tablet, Take 1 tablet (10 mg total) by mouth daily., Disp: 30 tablet, Rfl: 6 .  mometasone-formoterol (DULERA) 200-5 MCG/ACT AERO, Inhale 2 puffs into the lungs 2 (two) times daily., Disp: 1 Inhaler, Rfl: 5 .  Multiple Vitamin (MULTIVITAMIN WITH MINERALS) TABS tablet, Take 1 tablet by mouth every other day., Disp: , Rfl:  .  ondansetron (ZOFRAN ODT) 4 MG disintegrating tablet, Take 1 tablet (4 mg total) by mouth every 8 (eight) hours as needed for nausea or vomiting. (Patient not taking: Reported on 01/31/2018), Disp: 10 tablet, Rfl: 0 .  Tiotropium Bromide Monohydrate (SPIRIVA RESPIMAT) 2.5 MCG/ACT AERS, Inhale 2 puffs into the lungs daily., Disp: 1 Inhaler, Rfl: 5 .  traMADol (ULTRAM) 50 MG tablet, Take 1 tablet (50 mg total) by mouth every 6 (six) hours as needed., Disp: 15 tablet, Rfl: 0  Allergies  Allergen Reactions  . Aspirin Other (See Comments)    REACTION: GI upset/Hernia   . Ciprofloxacin Other (See Comments)    Tendonitis, joint pain, and nausea.  "Interfered with her HCTZ" (also)  . Codeine Nausea And Vomiting and Other (  See Comments)    (Also) GI upset/Hernia (CAN TOLERATE DILAUDID & MORPHINE)  . Eggs Or Egg-Derived Products Nausea Only    Stomach pains  . Fentanyl Nausea Only    Tolerates Dilaudid or morphine much better  . Hydrocodone Nausea And Vomiting  . Lactose Intolerance (Gi) Nausea And Vomiting and Other (See Comments)    (Also) vertigo  . Oxycodone Nausea And Vomiting    Tolerates hydromorphone better  . Penicillins Rash    Has patient had a PCN reaction causing immediate rash, facial/tongue/throat swelling, SOB or lightheadedness with hypotension: Yes Has patient had a PCN  reaction causing severe rash involving mucus membranes or skin necrosis: No Has patient had a PCN reaction that required hospitalization No Has patient had a PCN reaction occurring within the last 10 years: No If all of the above answers are "NO", then may proceed with Cephalosporin use.          Objective:  Physical Exam  General: AAO x3, NAD  Dermatological: Right hallux toenails hypertrophic, dystrophic, discolored insidiously she does get pain in the nail.  There is no surrounding redness or drainage or any signs of infection noted today.  No open lesions.  Vascular: Dorsalis Pedis artery and Posterior Tibial artery pedal pulses are 2/4 bilateral with immedate capillary fill time.  There is no pain with calf compression, swelling, warmth, erythema.   Neruologic: Grossly intact via light touch bilateral.  Protective threshold with Semmes Wienstein monofilament intact to all pedal sites bilateral.   Musculoskeletal: No gross boney pedal deformities bilateral. No pain, crepitus, or limitation noted with foot and ankle range of motion bilateral. Muscular strength 5/5 in all groups tested bilateral.  Gait: Unassisted, Nonantalgic.       Assessment:   Right chronic hallux onychomycosis, onychodystrophy     Plan:  -Treatment options discussed including all alternatives, risks, and complications -Etiology of symptoms were discussed -At this time, the patient is requesting partial nail removal with chemical matricectomy to the symptomatic portion of the nail. Risks and complications were discussed with the patient for which they understand and written consent was obtained. Under sterile conditions a total of 3 mL of a mixture of 2% lidocaine plain and 0.5% Marcaine plain was infiltrated in a hallux block fashion. Once anesthetized, the skin was prepped in sterile fashion. A tourniquet was then applied. Next the right hallux nail was then excised making sure to remove the entire offending  nail border. Once the nails were ensured to be removed area was debrided and the underlying skin was intact. There is no purulence identified in the procedure. Next phenol was then applied under standard conditions and copiously irrigated. Silvadene was applied. A dry sterile dressing was applied. After application of the dressing the tourniquet was removed and there is found to be an immediate capillary refill time to the digit. The patient tolerated the procedure well any complications. Post procedure instructions were discussed the patient for which he verbally understood. Follow-up in one week for nail check or sooner if any problems are to arise. Discussed signs/symptoms of infection and directed to call the office immediately should any occur or go directly to the emergency room. In the meantime, encouraged to call the office with any questions, concerns, changes symptoms.  Vivi Barrack DPM

## 2018-02-16 ENCOUNTER — Ambulatory Visit (INDEPENDENT_AMBULATORY_CARE_PROVIDER_SITE_OTHER): Payer: Medicare Other

## 2018-02-16 DIAGNOSIS — B351 Tinea unguium: Secondary | ICD-10-CM

## 2018-02-16 NOTE — Patient Instructions (Signed)

## 2018-02-22 NOTE — Progress Notes (Signed)
Patient presents for follow-up appointment post ingrown nail removal performed on 02/10/2018, right hallux nail.  She says that she has not had any problems with it, currently has no pain.  When patient removed her sock, noted that she was not bandaging her toe.  No redness, no erythema, no drainage, no other signs and symptoms of infection.  Overall the area is healing well and is beginning to scab over.  Discussed signs and symptoms of infection and importance of hygiene and keeping her toe clean.  I advised her to bandage the area while she is out to prevent infection.  Verbal and written instructions were dispensed.  She is to follow-up with any acute symptom changes.

## 2018-03-07 ENCOUNTER — Telehealth: Payer: Self-pay | Admitting: Family Medicine

## 2018-03-07 NOTE — Telephone Encounter (Signed)
Will route to PCP 

## 2018-03-07 NOTE — Telephone Encounter (Signed)
Patient is wanting to get furniture with [employer] (washer/dryer)  However, was told she needs a DR note that says she is un capable of going to a laundry place. Please follow up with patient.

## 2018-03-07 NOTE — Telephone Encounter (Signed)
Unfortunately I am unable to provide a documentation with those specifications.

## 2018-03-09 NOTE — Telephone Encounter (Signed)
Patient was called and informed via voicemail that letter will not be provided by PCP.

## 2018-03-29 ENCOUNTER — Ambulatory Visit: Payer: Self-pay | Admitting: *Deleted

## 2018-04-28 ENCOUNTER — Ambulatory Visit (INDEPENDENT_AMBULATORY_CARE_PROVIDER_SITE_OTHER): Payer: Medicare Other | Admitting: Primary Care

## 2018-04-28 ENCOUNTER — Encounter: Payer: Self-pay | Admitting: Primary Care

## 2018-04-28 ENCOUNTER — Ambulatory Visit: Payer: Medicare Other | Admitting: Primary Care

## 2018-04-28 VITALS — BP 128/86 | HR 67 | Temp 97.5°F | Ht 64.0 in | Wt 142.8 lb

## 2018-04-28 DIAGNOSIS — J441 Chronic obstructive pulmonary disease with (acute) exacerbation: Secondary | ICD-10-CM | POA: Diagnosis not present

## 2018-04-28 MED ORDER — DOXYCYCLINE HYCLATE 100 MG PO TABS: 100.0000 mg | ORAL_TABLET | Freq: Two times a day (BID) | ORAL | 0 refills | Status: DC

## 2018-04-28 MED ORDER — PREDNISONE 10 MG PO TABS: ORAL_TABLET | ORAL | 0 refills | Status: DC

## 2018-04-28 NOTE — Patient Instructions (Addendum)
  Sending in doxycyline and prednisone taper for COPD exacerbation  Continue Dulera and Spiriva as prescribed  Add Flonase or Nasacort nasal spray daily until better   Strongly encourage smoking cessation   If not better in 7-10 days please call/return or if symptoms worsen

## 2018-04-28 NOTE — Assessment & Plan Note (Addendum)
COPD exacerbation d.t recent light smoke exposure - Needs Doxycycline 100mg  BID x 7 days and prednisone course - Continue Dulera and Spiriva - Add nasal steroid OTC for congestion  - Declined CXR today, consider if not improving  - Encouraged smoking cessation - FU if symptoms do not improve in 7-10 days or worsen
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
## 2018-04-29 NOTE — Progress Notes (Signed)
Reviewed, agree 

## 2018-05-01 ENCOUNTER — Ambulatory Visit: Payer: Self-pay | Admitting: *Deleted

## 2018-05-02 ENCOUNTER — Other Ambulatory Visit: Payer: Self-pay | Admitting: *Deleted

## 2018-05-02 NOTE — Patient Outreach (Signed)
[facility]) Care Management  05/02/2018  Madie Renollen D Bero Apr 23, 1961 [id number]   RN Health Coach Quarterly Outreach   Outreach Attempt:  Outreach attempt #1 to patient for quarterly follow up. No answer. RN Health Coach left HIPAA compliant voicemail message along with contact information.  Plan:  RN Health Coach will schedule another telephone outreach to patient within the month of November.   Rhae LernerFarrah Rewa Weissberg RN [facility] Care Management  RN Health Coach [phone] [email or web address]

## 2018-05-03 ENCOUNTER — Ambulatory Visit: Payer: Medicare Other | Admitting: Primary Care

## 2018-05-09 ENCOUNTER — Other Ambulatory Visit: Payer: Self-pay | Admitting: *Deleted

## 2018-05-09 NOTE — Patient Outreach (Signed)
[facility]) Care Management  05/09/2018   Maria Oliver [birth year] [id number]  RN Health Coach telephone call to patient.  Hipaa compliance verified. Per patient she has finished her steroids and antibiotics. Patient stated that now she is having motion dizziness at this time. RN explained to patient that she needs to make the physician aware before it gets worse or going into the Holidays. Patient stated she would if it got any worse and that her son was coming home for the [facility] and would be with her. Patient has agreed to follow up outreach calls.    Current Medications:  Current Outpatient Medications  Medication Sig Dispense Refill  . albuterol (VENTOLIN HFA) 108 (90 Base) MCG/ACT inhaler Inhale 2 puffs into the lungs every 6 (six) hours as needed for wheezing or shortness of breath. 18 g 3  . cholecalciferol (VITAMIN D) 1000 units tablet Take 1,000 Units by mouth every other day.     . dicyclomine (BENTYL) 20 MG tablet Take 1 tablet (20 mg total) by mouth 2 (two) times daily. (Patient not taking: Reported on 04/28/2018) 20 tablet 0  . doxycycline (VIBRA-TABS) 100 MG tablet Take 1 tablet (100 mg total) by mouth 2 (two) times daily. 14 tablet 0  . ibuprofen (ADVIL,MOTRIN) 200 MG tablet Take 400 mg by mouth every 6 (six) hours as needed for mild pain.    Marland Kitchen lisinopril (PRINIVIL,ZESTRIL) 10 MG tablet Take 1 tablet (10 mg total) by mouth daily. 30 tablet 6  . mometasone-formoterol (DULERA) 200-5 MCG/ACT AERO Inhale 2 puffs into the lungs 2 (two) times daily. 1 Inhaler 5  . Multiple Vitamin (MULTIVITAMIN WITH MINERALS) TABS tablet Take 1 tablet by mouth every other day.    . ondansetron (ZOFRAN ODT) 4 MG disintegrating tablet Take 1 tablet (4 mg total) by mouth every 8 (eight) hours as needed for nausea or vomiting. (Patient not taking: Reported on 01/31/2018) 10 tablet 0  . predniSONE (DELTASONE) 10 MG tablet Take 4 tabs po daily x 2 days; then 3 tabs for 2 days; then 2  tabs for 2 days; then 1 tab for 2 days 20 tablet 0  . Tiotropium Bromide Monohydrate (SPIRIVA RESPIMAT) 2.5 MCG/ACT AERS Inhale 2 puffs into the lungs daily. 1 Inhaler 5  . traMADol (ULTRAM) 50 MG tablet Take 1 tablet (50 mg total) by mouth every 6 (six) hours as needed. (Patient not taking: Reported on 04/28/2018) 15 tablet 0   No current facility-administered medications for this visit.     Functional Status:  In your present state of health, do you have any difficulty performing the following activities: 12/13/2017  Hearing? N  Vision? N  Difficulty concentrating or making decisions? N  Walking or climbing stairs? Y  Dressing or bathing? N  Doing errands, shopping? Y  Preparing Food and eating ? N  Using the Toilet? N  In the past six months, have you accidently leaked urine? N  Do you have problems with loss of bowel control? N  Managing your Medications? N  Managing your Finances? N  Housekeeping or managing your Housekeeping? N  Some recent data might be hidden    Fall/Depression Screening: Fall Risk  02/06/2018 01/26/2018 12/13/2017  Falls in the past year? No No No  Number falls in past yr: - - -  Injury with Fall? - - -   [facility] 2/9 Scores 02/06/2018 01/26/2018 12/13/2017 11/28/2017 11/02/2017 06/01/2017 01/07/2017  PHQ - 2 Score '4 6 6 6 4 2 ' 2  PHQ- 9 Score '12 10 10 10 12 5 5   ' [facility] CM Care Plan Problem One     Most Recent Value  Care Plan Problem One  Knowledge Deficit in Self Management of COPD  Role Documenting the Problem One  Health Coach  Care Plan for Problem One  Active  [facility] Long Term Goal   Patient with not have any readmission within the next 90 days for COPD exacerbation  [facility] Long Term Goal Start Date  05/09/18  Interventions for Problem One Long Term Goal  RN reiterated the zones. RN reiterated follow up after 11/15 visit with patient still experiencing dizziness. RN sent  the COPD form. RN will follow up with further discussion  [facility] CM Short Term Goal #1   Patient will  have a better understing of COPD and physical activity within the next 30 days  [facility] CM Short Term Goal #1 Start Date  05/09/18  Interventions for Short Term Goal #1  RN discussed activity and COPD. RN sent Clinical key educational material. RN will follow up with further discussion   [facility] CM Short Term Goal #2   Patient will be able to verbalize what purselip breathing is within the next 30 days  [facility] CM Short Term Goal #2 Start Date  05/09/18  Interventions for Short Term Goal #2  RN reiterates to patient to try it if having some difficulty breathing. RN will follow up for further discussion  [facility] CM Short Term Goal #3  Patient will follow up with Health Maintenance witihin the next 30 days  [facility] CM Short Term Goal #3 Start Date  05/09/18  Interventions for Short Tern Goal #3  RN reiterated with patient to possibly look at hearing test and follow up with Dr office regarding her continue dizziness. RN will follow up with further discussion  [facility] CM Short Term Goal #4 Met Date  05/09/18      Assessment:  Patient has finished atbx and steriods Patient states she is feeling better Patient is having some motion dizziness Plan: RN sent 2020 Calendar book  RN sent patient educational material on COPD and physical activity RN sent educational material on Eating plan for COPD patients RN sent COPD form as reminder of zones and what to do RN sent steps to quit smoking RN will follow up within the month of January  Brielle Moro [facility] Management [phone]

## 2018-06-12 ENCOUNTER — Ambulatory Visit (INDEPENDENT_AMBULATORY_CARE_PROVIDER_SITE_OTHER): Payer: Medicare Other | Admitting: Adult Health

## 2018-06-12 ENCOUNTER — Encounter: Payer: Self-pay | Admitting: Adult Health

## 2018-06-12 VITALS — BP 148/86 | HR 92 | Temp 98.4°F | Ht 64.0 in | Wt 141.6 lb

## 2018-06-12 DIAGNOSIS — J441 Chronic obstructive pulmonary disease with (acute) exacerbation: Secondary | ICD-10-CM

## 2018-06-12 DIAGNOSIS — J432 Centrilobular emphysema: Secondary | ICD-10-CM

## 2018-06-12 DIAGNOSIS — R52 Pain, unspecified: Secondary | ICD-10-CM | POA: Diagnosis not present

## 2018-06-12 DIAGNOSIS — Z720 Tobacco use: Secondary | ICD-10-CM | POA: Diagnosis not present

## 2018-06-12 LAB — POCT INFLUENZA A/B
Influenza A, POC: NEGATIVE
Influenza B, POC: NEGATIVE

## 2018-06-12 MED ORDER — LEVALBUTEROL HCL 0.63 MG/3ML IN NEBU
0.6300 mg | INHALATION_SOLUTION | Freq: Once | RESPIRATORY_TRACT | Status: AC
  Administered 2018-06-12: 0.63 mg via RESPIRATORY_TRACT

## 2018-06-12 MED ORDER — PREDNISONE 10 MG PO TABS: ORAL_TABLET | ORAL | 0 refills | Status: DC

## 2018-06-12 MED ORDER — CLARITHROMYCIN 500 MG PO TABS: 500.0000 mg | ORAL_TABLET | Freq: Two times a day (BID) | ORAL | 0 refills | Status: DC

## 2018-06-12 NOTE — Assessment & Plan Note (Signed)
Smoking cessation  

## 2018-06-12 NOTE — Patient Instructions (Addendum)
Begin Biaxin 500 mg twice daily for 1 week, take with food Mucinex DM twice daily as needed for cough and congestion Prednisone taper over the next week Work on not smoking Continue on Dulera 2 puffs twice daily Continue on Spiriva daily Warm compresses to eye , if not improving will need to see your eye doctor  Discussed with your primary care provider that lisinopril may be aggravating your cough. Follow up with Dr. Kendrick FriesMcQuaid in 3 months and As needed   Please contact office for sooner follow up if symptoms do not improve or worsen or seek emergency care

## 2018-06-12 NOTE — Progress Notes (Signed)
@Patient  ID: Maria Oliver, female    DOB: 1961/01/10, 57 y.o.   MRN: [id number]  Chief Complaint  Patient presents with  . Acute Visit    Cough     Referring provider: Hoy Register, MD  HPI: 57 year old female active smoker followed for COPD  TEST/EVENTS :   PFT's 07/2014:  FEV1 1.63 (63%), ratio 66, 19% increase with BD, +airtrapping, normal TLC, DLCO 54%  06/12/2018 Acute OV : Cough /COPD , smoker  Patient presents for an acute office visit.  She says that she has been sick for the last 4 days with increased cough congestion thick yellow mucus wheezing and shortness of breath.  She says that she was exposed to respiratory illnesses over the holidays with grandchildren who had upper respiratory infections and pinkeye.  She remains on Dulera and Spiriva. No weight loss or hemoptysis . Appetite is good w/ no n/v/d.  Flu swab today in clinic is neg .   Of note patient is on ACE inhibitor.  Patient has had 2 COPD exacerbations over the last 6 months, he did with antibiotics and steroids.. Patient does continue to smoke.  Smoking cessation was discussed in detail.  Quit smoking information was given to patient  Patient does participate in the CT chest lung cancer screening program.  Last CT was July 2019 that showed lung RADS 2 as benign appearance.  Allergies  Allergen Reactions  . Aspirin Other (See Comments)    REACTION: GI upset/Hernia   . Ciprofloxacin Other (See Comments)    Tendonitis, joint pain, and nausea.  "Interfered with her HCTZ" (also)  . Codeine Nausea And Vomiting and Other (See Comments)    (Also) GI upset/Hernia (CAN TOLERATE DILAUDID & MORPHINE)  . Eggs Or Egg-Derived Products Nausea Only    Stomach pains  . Fentanyl Nausea Only    Tolerates Dilaudid or morphine much better  . Hydrocodone Nausea And Vomiting  . Lactose Intolerance (Gi) Nausea And Vomiting and Other (See Comments)    (Also) vertigo  . Oxycodone Nausea And Vomiting    Tolerates  hydromorphone better  . Penicillins Rash    Has patient had a PCN reaction causing immediate rash, facial/tongue/throat swelling, SOB or lightheadedness with hypotension: Yes Has patient had a PCN reaction causing severe rash involving mucus membranes or skin necrosis: No Has patient had a PCN reaction that required hospitalization No Has patient had a PCN reaction occurring within the last 10 years: No If all of the above answers are "NO", then may proceed with Cephalosporin use.     Immunization History  Administered Date(s) Administered  . Tdap 10/07/2013    Past Medical History:  Diagnosis Date  . Abdominal pain   . Anxiety   . Arthritis   . Asthma   . Chronic cholecystitis with calculus s/p lap cholecystectomy 09/09/2016 09/09/2016  . Complication of anesthesia    slow to arouse post operatively   . COPD (chronic obstructive pulmonary disease) (HCC)   . 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 Generalized headaches   . Hernia, inguinal, right   . Hypertension   . Leg swelling     Tobacco History: Social History   Tobacco Use  Smoking Status Current Every Day Smoker  . Packs/day: 1.00  . Years: 47.00  . Pack years: 47.00  . Types: Cigarettes  Smokeless Tobacco Never Used  Tobacco Comment   5 cigarettes/day 11/28/17   Ready to quit: No Counseling given: Yes Comment:  5 cigarettes/day 11/28/17   Outpatient Medications Prior to Visit  Medication Sig Dispense Refill  . albuterol (VENTOLIN HFA) 108 (90 Base) MCG/ACT inhaler Inhale 2 puffs into the lungs every 6 (six) hours as needed for wheezing or shortness of breath. 18 g 3  . ibuprofen (ADVIL,MOTRIN) 200 MG tablet Take 400 mg by mouth every 6 (six) hours as needed for mild pain.    Marland Kitchen. lisinopril (PRINIVIL,ZESTRIL) 10 MG tablet Take 1 tablet (10 mg total) by mouth daily. 30 tablet 6  . mometasone-formoterol (DULERA) 200-5 MCG/ACT AERO Inhale 2 puffs into the lungs 2 (two) times  daily. 1 Inhaler 5  . Multiple Vitamin (MULTIVITAMIN WITH MINERALS) TABS tablet Take 1 tablet by mouth every other day.    . ondansetron (ZOFRAN ODT) 4 MG disintegrating tablet Take 1 tablet (4 mg total) by mouth every 8 (eight) hours as needed for nausea or vomiting. 10 tablet 0  . Tiotropium Bromide Monohydrate (SPIRIVA RESPIMAT) 2.5 MCG/ACT AERS Inhale 2 puffs into the lungs daily. 1 Inhaler 5  . traMADol (ULTRAM) 50 MG tablet Take 1 tablet (50 mg total) by mouth every 6 (six) hours as needed. 15 tablet 0  . cholecalciferol (VITAMIN D) 1000 units tablet Take 1,000 Units by mouth every other day.     . dicyclomine (BENTYL) 20 MG tablet Take 1 tablet (20 mg total) by mouth 2 (two) times daily. (Patient not taking: Reported on 04/28/2018) 20 tablet 0  . doxycycline (VIBRA-TABS) 100 MG tablet Take 1 tablet (100 mg total) by mouth 2 (two) times daily. (Patient not taking: Reported on 06/12/2018) 14 tablet 0  . predniSONE (DELTASONE) 10 MG tablet Take 4 tabs po daily x 2 days; then 3 tabs for 2 days; then 2 tabs for 2 days; then 1 tab for 2 days (Patient not taking: Reported on 06/12/2018) 20 tablet 0   No facility-administered medications prior to visit.      Review of Systems  Constitutional:   No  weight loss, night sweats,  Fevers, chills,  +fatigue, or  lassitude.  HEENT:   No headaches,  Difficulty swallowing,  Tooth/dental problems, or  Sore throat,                No sneezing, itching, ear ache, + nasal congestion, post nasal drip,   CV:  No chest pain,  Orthopnea, PND, swelling in lower extremities, anasarca, dizziness, palpitations, syncope.   GI  No heartburn, indigestion, abdominal pain, nausea, vomiting, diarrhea, change in bowel habits, loss of appetite, bloody stools.   Resp:  No wheezing.  No chest wall deformity  Skin: no rash or lesions.  GU: no dysuria, change in color of urine, no urgency or frequency.  No flank pain, no hematuria   MS:  No joint pain or swelling.  No  decreased range of motion.  No back pain.    Physical Exam  BP (!) 148/86 (BP Location: Left Arm, Cuff Size: Normal)   Pulse 92   Temp 98.4 F (36.9 C) (Oral)   Ht 5\' 4"  (1.626 m)   Wt 141 lb 9.6 oz (64.2 kg)   SpO2 97%   BMI 24.31 kg/m   GEN: A/Ox3; pleasant , NAD, well nourished    HEENT:  [place]/AT,  Conjunctiva mild redness on right , EACs-clear, TMs-wnl, NOSE-clear drainage , THROAT-clear, no lesions, no postnasal drip or exudate noted.   NECK:  Supple w/ fair ROM; no JVD; normal carotid impulses w/o bruits; no thyromegaly or nodules palpated; no  lymphadenopathy.    RESP  Few trace rhonchi  no accessory muscle use, no dullness to percussion  CARD:  RRR, no m/r/g, no peripheral edema, pulses intact, no cyanosis or clubbing.  GI:   Soft & nt; nml bowel sounds; no organomegaly or masses detected.   Musco: Warm bil, no deformities or joint swelling noted.   Neuro: alert, no focal deficits noted.    Skin: Warm, no lesions or rashes    Lab Results:  CBC  BNP No results found for: BNP  ProBNP No results found for: PROBNP  Imaging: No results found.  levalbuterol (XOPENEX) nebulizer solution 0.63 mg    Date Action Dose Route User   06/12/2018 1041 Given 0.63 mg Nebulization Lowell BoutonJones, Jessica E, CMA      PFT Results Latest Ref Rng & Units 08/02/2014  FVC-Pre L 2.47  FVC-Predicted Pre % 75  FVC-Post L 2.76  FVC-Predicted Post % 83  Pre FEV1/FVC % % 66  Post FEV1/FCV % % 71  FEV1-Pre L 1.63  FEV1-Predicted Pre % 63  FEV1-Post L 1.95  DLCO UNC% % 54  DLCO COR %Predicted % 62  TLC L 5.72  TLC % Predicted % 116  RV % Predicted % 180    No results found for: NITRICOXIDE      Assessment & Plan:   COPD (chronic obstructive pulmonary disease) with emphysema Acute exacerbation with URI/Bronchitis  Flu swab neg  For next nebulizer treatment given in the office  Patient is prone to recurrent exacerbations.  Advised on smoking cessation.  ACE inhibitor may  be aggravating her cough in a patient that has a frequent exacerbations would prefer if she was on an alternative if possible.  We will leave this to her primary care provider.    Plan  Patient Instructions  Begin Biaxin 500 mg twice daily for 1 week, take with food Mucinex DM twice daily as needed for cough and congestion Prednisone taper over the next week Work on not smoking Continue on Dulera 2 puffs twice daily Continue on Spiriva daily Warm compresses to eye , if not improving will need to see your eye doctor  Discussed with your primary care provider that lisinopril may be aggravating your cough. Follow up with Dr. Kendrick FriesMcQuaid in 3 months and As needed   Please contact office for sooner follow up if symptoms do not improve or worsen or seek emergency care       Tobacco abuse Smoking cessation     Rubye Oaksammy Zyron Deeley, NP 06/12/2018

## 2018-06-12 NOTE — Assessment & Plan Note (Signed)
Acute exacerbation with URI/Bronchitis  Flu swab neg  For next nebulizer treatment given in the office  Patient is prone to recurrent exacerbations.  Advised on smoking cessation.  ACE inhibitor may be aggravating her cough in a patient that has a frequent exacerbations would prefer if she was on an alternative if possible.  We will leave this to her primary care provider.    Plan  Patient Instructions  Begin Biaxin 500 mg twice daily for 1 week, take with food Mucinex DM twice daily as needed for cough and congestion Prednisone taper over the next week Work on not smoking Continue on Dulera 2 puffs twice daily Continue on Spiriva daily Warm compresses to eye , if not improving will need to see your eye doctor  Discussed with your primary care provider that lisinopril may be aggravating your cough. Follow up with Dr. Kendrick FriesMcQuaid in 3 months and As needed   Please contact office for sooner follow up if symptoms do not improve or worsen or seek emergency care

## 2018-06-14 NOTE — Progress Notes (Signed)
Reviewed, agree 

## 2018-06-22 ENCOUNTER — Other Ambulatory Visit: Payer: Self-pay | Admitting: *Deleted

## 2018-06-22 NOTE — Patient Outreach (Signed)
[facility]) Care Management  06/22/2018  Maria Oliver 01-Oct-1960 [id number]   RN Health Coach attempted follow up outreach call to patient.  Patient was unavailable. HIPPA compliance voicemail message left with return callback number.  Plan: RN will call patient again within 30 days.  Gean Maidens BSN RN [facility] Care Management [phone]

## 2018-07-19 ENCOUNTER — Other Ambulatory Visit: Payer: Self-pay | Admitting: *Deleted

## 2018-07-19 NOTE — Patient Outreach (Signed)
[name]) Care Management  07/19/2018   Maria Oliver 18-Dec-1960 [id number]  RN Health Coach telephone call to patient.  Hipaa compliance verified. Per patient she is doing good. Patient is in the green zone. She is still currently smoking. She does not have a routine exercise mostly walking around int the house and in the yard. . Her appetite is good. Patient has not had any admissions since last outreach. Patient has one Dr visit since last outreach. Patient has agreed to follow up outreach calls.   Current Medications:  Current Outpatient Medications  Medication Sig Dispense Refill  . albuterol (VENTOLIN HFA) 108 (90 Base) MCG/ACT inhaler Inhale 2 puffs into the lungs every 6 (six) hours as needed for wheezing or shortness of breath. 18 g 3  . cholecalciferol (VITAMIN D) 1000 units tablet Take 1,000 Units by mouth every other day.     . clarithromycin (BIAXIN) 500 MG tablet Take 1 tablet (500 mg total) by mouth 2 (two) times daily. 14 tablet 0  . dicyclomine (BENTYL) 20 MG tablet Take 1 tablet (20 mg total) by mouth 2 (two) times daily. (Patient not taking: Reported on 04/28/2018) 20 tablet 0  . ibuprofen (ADVIL,MOTRIN) 200 MG tablet Take 400 mg by mouth every 6 (six) hours as needed for mild pain.    Marland Kitchen lisinopril (PRINIVIL,ZESTRIL) 10 MG tablet Take 1 tablet (10 mg total) by mouth daily. 30 tablet 6  . mometasone-formoterol (DULERA) 200-5 MCG/ACT AERO Inhale 2 puffs into the lungs 2 (two) times daily. 1 Inhaler 5  . Multiple Vitamin (MULTIVITAMIN WITH MINERALS) TABS tablet Take 1 tablet by mouth every other day.    . ondansetron (ZOFRAN ODT) 4 MG disintegrating tablet Take 1 tablet (4 mg total) by mouth every 8 (eight) hours as needed for nausea or vomiting. 10 tablet 0  . predniSONE (DELTASONE) 10 MG tablet 4 tabs for 2 days, then 3 tabs for 2 days, 2 tabs for 2 days, then 1 tab for 2 days, then stop 20 tablet 0  . Tiotropium Bromide Monohydrate (SPIRIVA RESPIMAT) 2.5  MCG/ACT AERS Inhale 2 puffs into the lungs daily. 1 Inhaler 5  . traMADol (ULTRAM) 50 MG tablet Take 1 tablet (50 mg total) by mouth every 6 (six) hours as needed. 15 tablet 0   No current facility-administered medications for this visit.     Functional Status:  In your present state of health, do you have any difficulty performing the following activities: 07/19/2018 12/13/2017  Hearing? N N  Vision? Y N  Difficulty concentrating or making decisions? N N  Walking or climbing stairs? Y Y  Dressing or bathing? N N  Doing errands, shopping? Tempie Donning  Preparing Food and eating ? N N  Using the Toilet? N N  In the past six months, have you accidently leaked urine? N N  Do you have problems with loss of bowel control? N N  Managing your Medications? N N  Managing your Finances? N N  Housekeeping or managing your Housekeeping? N N  Some recent data might be hidden    Fall/Depression Screening: Fall Risk  07/19/2018 02/06/2018 01/26/2018  Falls in the past year? 0 No No  Number falls in past yr: - - -  Injury with Fall? - - -   PHQ 2/9 Scores 07/19/2018 02/06/2018 01/26/2018 12/13/2017 11/28/2017 11/02/2017 06/01/2017  PHQ - 2 Score _0 PHQ- 9 Score _1 5  THN CM Care Plan Problem One     Most Recent Value  Care Plan Problem One  Knowledge Deficit in Self Management of COPD  Role Documenting the Problem One  Health Coach  Care Plan for Problem One  Active  THN Long Term Goal   Patient with not have any readmission within the next 90 days for COPD exacerbation  THN Long Term Goal Start Date  07/19/18  Interventions for Problem One Long Term Goal  RN reiterates the zones and action plan. RN discussed keeping warm and monitoring weather. RN discussed medication adherence. RN reiterated keeping appointments. RN willfollow up with further discussion  THN CM Short Term Goal #1   Patient will have a better understing of COPD and physical activity within the next 30 days  THN CM Short  Term Goal #1 Met Date  07/19/18  [facility] CM Short Term Goal #2   Patient will be able to verbalize what purselip breathing is within the next 30 days  THN CM Short Term Goal #2 Met Date  07/19/18  [facility] CM Short Term Goal #3  Patient will follow up with Health Maintenance witihin the next 30 days  THN CM Short Term Goal #3 Start Date  07/19/18  Interventions for Short Tern Goal #3  RN discussed getting eye exam and making healkth maintenance appointments. RN will follow up for compliance  THN CM Short Term Goal #4  Patient will be able to have a better understanding of cold symptoms, flu symtoms and pneumonia  THN CM Short Term Goal #4 Met Date  07/19/18      Assessment:  Patient can verbalize zones and action plan Still currently smoking Per patient a slight occasional cough Plan:  RN discussed Health Maintenance RN discussed medication adherence Patient is making an eye appointment RN will follow up within the month of May and look at case closure  [place] Management [phone]

## 2018-08-08 ENCOUNTER — Other Ambulatory Visit: Payer: Self-pay | Admitting: Family Medicine

## 2018-08-08 DIAGNOSIS — I10 Essential (primary) hypertension: Secondary | ICD-10-CM

## 2018-09-08 ENCOUNTER — Other Ambulatory Visit: Payer: Self-pay | Admitting: Family Medicine

## 2018-09-08 DIAGNOSIS — I10 Essential (primary) hypertension: Secondary | ICD-10-CM

## 2018-09-08 MED ORDER — LISINOPRIL 10 MG PO TABS: ORAL_TABLET | ORAL | 0 refills | Status: DC

## 2018-09-08 NOTE — Telephone Encounter (Signed)
1) Medication(s) Requested (by name): lisinopril (PRINIVIL,ZESTRIL) 10 MG tablet    2) Pharmacy of Choice:[facility]   3) Special Requests:   Approved medications will be sent to the pharmacy, we will reach out if there is an issue.  Requests made after 3pm may not be addressed until the following business day!  If a patient is unsure of the name of the medication(s) please note and ask patient to call back when they are able to provide all info, do not send to responsible party until all information is available!

## 2018-09-08 NOTE — Telephone Encounter (Signed)
Patient last seen in clinic 02/06/18 - she was instructed to make appt but has not done so. Will defer to PCP to fill if appropriate.

## 2018-09-21 ENCOUNTER — Other Ambulatory Visit: Payer: Self-pay

## 2018-09-21 ENCOUNTER — Encounter: Payer: Self-pay | Admitting: Adult Health

## 2018-09-21 ENCOUNTER — Ambulatory Visit (INDEPENDENT_AMBULATORY_CARE_PROVIDER_SITE_OTHER): Payer: Medicare Other | Admitting: Adult Health

## 2018-09-21 ENCOUNTER — Telehealth: Payer: Self-pay | Admitting: Adult Health

## 2018-09-21 DIAGNOSIS — F17200 Nicotine dependence, unspecified, uncomplicated: Secondary | ICD-10-CM

## 2018-09-21 DIAGNOSIS — J301 Allergic rhinitis due to pollen: Secondary | ICD-10-CM

## 2018-09-21 DIAGNOSIS — J441 Chronic obstructive pulmonary disease with (acute) exacerbation: Secondary | ICD-10-CM

## 2018-09-21 MED ORDER — CLARITHROMYCIN 500 MG PO TABS: 500.0000 mg | ORAL_TABLET | Freq: Two times a day (BID) | ORAL | 0 refills | Status: DC

## 2018-09-21 MED ORDER — PREDNISONE 10 MG PO TABS: ORAL_TABLET | ORAL | 0 refills | Status: DC

## 2018-09-21 NOTE — Patient Instructions (Addendum)
Begin Zyrtec 10mg  daily for 2 weeks then As needed   Begin Flonase nasal 2 puffs daily for 2 weeks then As needed   Biaxin 500 mg twice daily for 1 week, take with food-to have on hold if symptom do not improve or worsen with discolored mucus .  Mucinex DM twice daily as needed for cough and congestion Prednisone taper over the next week- to have on hold if symptoms do not improve or worsen.  Work on not smoking Continue on [employer] 2 puffs twice daily Continue on Spiriva daily Discussed with your primary care provider that lisinopril may be aggravating your cough. Follow up with Dr. Kendrick Fries in 3 months and As needed   Please contact office for sooner follow up if symptoms do not improve or worsen or seek emergency care

## 2018-09-21 NOTE — Telephone Encounter (Signed)
Can we do televisit today ?

## 2018-09-21 NOTE — Progress Notes (Signed)
Virtual Visit via Telephone Note  I connected with Maria Oliver on 09/21/18 at 11:30 AM EDT by telephone and verified that I am speaking with the correct person using two identifiers.   I discussed the limitations, risks, security and privacy concerns of performing an evaluation and management service by telephone and the availability of in person appointments. I also discussed with the patient that there may be a patient responsible charge related to this service. The patient expressed understanding and agreed to proceed.   History of Present Illness: Today's tele-visit is for an acute office visit for Asthma  Patient is present for today's visit at home, myself is present for today's visit at office  58 yo female active smoker followed for COPD .  She complains of 2 days of watery eyes, nasal congestion drippy nose, minimally productive cough.  She has been taking Tylenol.  She denies any fever, chest pain, recent travel, orthopnea or edema. Says she has been outside with the high pollen exposure .  Dulera and Spiriva -not taking regularly . Started back on them 2 weeks ago.  Says that she has been doing well and felt that she did not need her inhalers.  Patient is on a ACE inhibitor.  We have discussed in the past that she has frequent COPD exacerbations and this could be aggravating her cough.  Have advised her to please discuss this with her primary care provider.  Patient does continue to smoke.  We discussed smoking cessation We discussed coronavirus precautions. Patient is requesting an antibiotic and prednisone, she is worried that she might have to go to the emergency room or might get worse over the holiday weekend.    Observations/Objective:  PFT's 07/2014: FEV1 1.63 (63%), ratio 66, 19% increase with BD, +airtrapping, normal TLC, DLCO 54%  Assessment and Plan: 1 acute allergic rhinitis suspected secondary to increased pollen exposure  2 COPD exacerbation with allergic  rhinitis flare I instructed her to hold onto antibiotic and prednisone and try to treat her potential allergen triggers.  Only to use antibiotic and prednisone if symptoms worsen with discolored mucus.  Have instructed her on coronavirus precautions.  Would like for her to discuss with her primary care provider that ACE inhibitor may be aggravating her cough.   3 smoking-encouraged on smoking cessation Plan  Patient Instructions  Begin Zyrtec 10mg  daily for 2 weeks then As needed   Begin Flonase nasal 2 puffs daily for 2 weeks then As needed   Biaxin 500 mg twice daily for 1 week, take with food-to have on hold if symptom do not improve or worsen with discolored mucus .  Mucinex DM twice daily as needed for cough and congestion Prednisone taper over the next week- to have on hold if symptoms do not improve or worsen.  Work on not smoking Continue on [employer] T[employer]Dulera 2 puffs twice daily Continue on Spiriva daily Discussed with your primary care provider that lisinopril may be aggravating your cough. Follow up with Dr. Kendrick FriesMcQuaid in 3 months and As needed   Please contact office for sooner follow up if symptoms do not improve or worsen or seek emergency care       Follow Up Instructions: Follow-up with Dr. Sherlie BanMcClain in 3 months and as needed   I discussed the assessment and treatment plan with the patient. The patient was provided an opportunity to ask questions and all were answered. The patient agreed with the plan and demonstrated an understanding of the instructions.  The patient was advised to call back or seek an in-person evaluation if the symptoms worsen or if the condition fails to improve as anticipated.  I provided 24 minutes of non-face-to-face time during this encounter.   Rubye Oaks, NP

## 2018-09-21 NOTE — Telephone Encounter (Signed)
Patient states she is feeling worse with a flare up and wants whatever she was given last time for her asthma. She thinks it prednisone. Patient has a televisit on Monday with TP, but she thinks she will need something before that visit.   Current symptoms are SOB, runny nose,congestion, no fever, no travel, no contact with anyone dx with Covid-19.  TP please advise

## 2018-09-21 NOTE — Progress Notes (Signed)
Reviewed, agree 

## 2018-09-21 NOTE — Telephone Encounter (Signed)
Call made to patient. Confirmed a good time for tele visit today. Appt made. Nothing further is needed at this time.  TP pt was placed in your 1130 slot. Thanks.

## 2018-09-25 ENCOUNTER — Ambulatory Visit: Payer: Medicare Other | Admitting: Pulmonary Disease

## 2018-09-25 ENCOUNTER — Ambulatory Visit: Payer: Medicare Other | Admitting: Adult Health

## 2018-10-13 ENCOUNTER — Ambulatory Visit: Payer: Medicare Other | Admitting: Pulmonary Disease

## 2018-10-17 ENCOUNTER — Other Ambulatory Visit: Payer: Self-pay | Admitting: *Deleted

## 2018-10-17 NOTE — Patient Outreach (Signed)
[facility]) Care Management  10/17/2018  TINA HERRINGSHAW 05-01-1961 [id number]  RN Health Coach attempted follow up outreach call to patient.  Patient was unavailable. HIPPA compliance voicemail message left with return callback number.  Plan: RN will call patient again within 30 days.  Gean Maidens BSN RN [facility] Care Management [phone]

## 2018-11-15 ENCOUNTER — Ambulatory Visit: Payer: Self-pay | Admitting: *Deleted

## 2018-12-11 ENCOUNTER — Other Ambulatory Visit: Payer: Self-pay | Admitting: *Deleted
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
## 2018-12-12 ENCOUNTER — Other Ambulatory Visit: Payer: Self-pay

## 2018-12-12 NOTE — Patient Outreach (Signed)
[facility]) Care Management  12/12/2018  KEILANA MORLOCK [birth year] [id number]   Social work referral received from [facility], [employer], to outreach patient regarding need for mattress and bedroom furniture.  Patient reports that her son and grandchildren recently moved in with her and the grandchildren do not have bedroom furniture. BSW and patient discussed options such as Database administrator.  She did acknowledge that the family may be able to afford furniture from one of these places.   BSW informed patient about [place] [facility] and she consented to a referral being submitted.  BSW will follow up within the next two weeks to ensure patient was contacted by [place] [facility].  Ronn Melena, BSW Social Worker [phone]

## 2018-12-25 ENCOUNTER — Other Ambulatory Visit: Payer: Self-pay

## 2018-12-25 NOTE — Patient Outreach (Signed)
[place] [facility]) Care Management  12/25/2018  FREDERICK KLINGER 04-Nov-1960 [id number]   BSW received response from [facility], [place], regarding referral that was submitted for furniture resources.  Lanelle Bal reported:  "Unfortunatley, the one resource we had in the client's area through [place] was unresponsive to this request.  Moreover, there are no resources through [place] [facility] at this time that can assist." BSW contacted patient to inform her of the above information.  BSW and patient did discuss some options on social media as well as [employer].  .Patient reported that her son has filed for unemployment and, if he is approved, should be able to purchase furniture.    BSW is closing case at this time.  Ronn Melena, BSW Social Worker [phone]

## 2019-01-04 ENCOUNTER — Other Ambulatory Visit: Payer: Self-pay | Admitting: Family Medicine

## 2019-01-04 DIAGNOSIS — J439 Emphysema, unspecified: Secondary | ICD-10-CM

## 2019-02-06 ENCOUNTER — Other Ambulatory Visit: Payer: Self-pay | Admitting: Family Medicine

## 2019-02-06 DIAGNOSIS — I10 Essential (primary) hypertension: Secondary | ICD-10-CM

## 2019-02-06 DIAGNOSIS — J439 Emphysema, unspecified: Secondary | ICD-10-CM

## 2019-02-13 ENCOUNTER — Ambulatory Visit: Payer: Medicare Other | Attending: Family Medicine | Admitting: Physician Assistant

## 2019-02-13 DIAGNOSIS — J439 Emphysema, unspecified: Secondary | ICD-10-CM | POA: Diagnosis not present

## 2019-02-13 DIAGNOSIS — I10 Essential (primary) hypertension: Secondary | ICD-10-CM | POA: Diagnosis not present

## 2019-02-13 MED ORDER — LISINOPRIL 10 MG PO TABS: ORAL_TABLET | ORAL | 3 refills | Status: DC

## 2019-02-13 MED ORDER — MOMETASONE FURO-FORMOTEROL FUM 200-5 MCG/ACT IN AERO: 2.0000 | INHALATION_SPRAY | Freq: Two times a day (BID) | RESPIRATORY_TRACT | 3 refills | Status: DC

## 2019-02-13 MED ORDER — ALBUTEROL SULFATE HFA 108 (90 BASE) MCG/ACT IN AERS: 2.0000 | INHALATION_SPRAY | Freq: Four times a day (QID) | RESPIRATORY_TRACT | 3 refills | Status: DC | PRN

## 2019-02-13 MED ORDER — SPIRIVA RESPIMAT 2.5 MCG/ACT IN AERS: 2.0000 | INHALATION_SPRAY | Freq: Every day | RESPIRATORY_TRACT | 3 refills | Status: DC

## 2019-02-13 NOTE — Progress Notes (Signed)
Patient ID: Maria Oliver, female   DOB: 13-May-1961, 58 y.o.   MRN: [id number] Virtual Visit via Telephone Note  I connected with Maria Oliver on 02/13/19 at  2:10 PM EDT by telephone and verified that I am speaking with the correct person using two identifiers.   I discussed the limitations, risks, security and privacy concerns of performing an evaluation and management service by telephone and the availability of in person appointments. I also discussed with the patient that there may be a patient responsible charge related to this service. The patient expressed understanding and agreed to proceed.  Patient location: home My Location:  [place] office Persons on the call: me and the patient   History of Present Illness:  Patient calls needing RF until she can see her PCP.  She has an appt mid September and will be getting lab work.  Sometimes lately she awakens in the middle of the night and BP has been elevated ~140/90 but she hasn't checked it at a time she has felt restful.  When she awakens at night and checks it, she feels anxious.  No HA/CP/SOB/Dizziness.      Observations/Objective:  NAD.  A&Ox3   Assessment and Plan: 1. Essential hypertension Check BP OOO and record and bring to next visit.  Check BP when at rest.  Consider increase lisinopril if needed and labs are good.   - lisinopril (ZESTRIL) 10 MG tablet; Take 1 tablet po daily  Dispense: 30 tablet; Refill: 3  2. Pulmonary emphysema, unspecified emphysema type ([place]) stable - mometasone-formoterol (DULERA) 200-5 MCG/ACT AERO; Inhale 2 puffs into the lungs 2 (two) times daily.  Dispense: 13 g; Refill: 3 - Tiotropium Bromide Monohydrate (SPIRIVA RESPIMAT) 2.5 MCG/ACT AERS; Inhale 2 puffs into the lungs daily.  Dispense: 4 g; Refill: 3 - albuterol (VENTOLIN HFA) 108 (90 Base) MCG/ACT inhaler; Inhale 2 puffs into the lungs every 6 (six) hours as needed for wheezing or shortness of breath.  Dispense: 18 g; Refill: 3    Follow  Up Instructions: See PCP as planned for mid-sept appt   I discussed the assessment and treatment plan with the patient. The patient was provided an opportunity to ask questions and all were answered. The patient agreed with the plan and demonstrated an understanding of the instructions.   The patient was advised to call back or seek an in-person evaluation if the symptoms worsen or if the condition fails to improve as anticipated.  I provided 11  minutes of non-face-to-face time during this encounter.   Freeman Caldron, PA-C

## 2019-02-21 ENCOUNTER — Inpatient Hospital Stay: Admission: RE | Admit: 2019-02-21 | Payer: Medicare Other | Source: Ambulatory Visit

## 2019-02-28 ENCOUNTER — Encounter: Payer: Self-pay | Admitting: Family Medicine

## 2019-02-28 ENCOUNTER — Ambulatory Visit: Payer: Medicare Other | Attending: Family Medicine | Admitting: Family Medicine

## 2019-02-28 ENCOUNTER — Other Ambulatory Visit: Payer: Self-pay

## 2019-02-28 VITALS — BP 162/87 | HR 74 | Temp 98.2°F | Ht 64.0 in | Wt 149.0 lb

## 2019-02-28 DIAGNOSIS — F17210 Nicotine dependence, cigarettes, uncomplicated: Secondary | ICD-10-CM | POA: Diagnosis not present

## 2019-02-28 DIAGNOSIS — Z720 Tobacco use: Secondary | ICD-10-CM

## 2019-02-28 DIAGNOSIS — J432 Centrilobular emphysema: Secondary | ICD-10-CM | POA: Diagnosis not present

## 2019-02-28 DIAGNOSIS — I10 Essential (primary) hypertension: Secondary | ICD-10-CM

## 2019-02-28 MED ORDER — AMLODIPINE BESYLATE 2.5 MG PO TABS: 2.5000 mg | ORAL_TABLET | Freq: Every day | ORAL | 1 refills | Status: DC

## 2019-02-28 NOTE — Progress Notes (Signed)
Subjective:  Patient ID: Maria Oliver, female    DOB: [birth year]  Age: 58 y.o. MRN: [id number]  CC: Hypertension   HPI Maria Oliver is a 58 year old female with a history of COPD, tobacco abuse, hypertension here for follow-up visit.  She states her blood pressures at home have been elevated with systolics in the 449 range and she sometimes wakes up with palpitations and headaches when that occurs.  She endorses compliance with lisinopril. Today she feels fine. Her COPD has been stable with no exacerbations. She continues to smoke cigarettes and smokes half a pack of cigarette which she has smoked since she was in the sixth grade. She has no additional concerns today  Past Medical History:  Diagnosis Date  . Abdominal pain   . Anxiety   . Arthritis   . Asthma   . Chronic cholecystitis with calculus s/p lap cholecystectomy 09/09/2016 09/09/2016  . Complication of anesthesia    slow to arouse post operatively   . COPD (chronic obstructive pulmonary disease) ([place])   . 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 Generalized headaches   . Hernia, inguinal, right   . Hypertension   . Leg swelling     Past Surgical History:  Procedure Laterality Date  . COLON SURGERY  2010   per patient "colon take down"  . COLOSTOMY  2010  . LAPAROSCOPIC CHOLECYSTECTOMY SINGLE SITE WITH INTRAOPERATIVE CHOLANGIOGRAM N/A 09/09/2016   Procedure: LAPAROSCOPIC CHOLECYSTECTOMY  WITH INTRAOPERATIVE CHOLANGIOGRAM;  Surgeon: Michael Boston, MD;  Location: [facility];  Service: General;  Laterality: N/A;  . TUBAL LIGATION      Family History  Problem Relation Age of Onset  . Heart disease Mother 43  . Heart failure Mother   . Asthma Mother   . Emphysema Mother   . Alzheimer's disease Father 2  . Emphysema Brother   . Asthma Brother   . Colon cancer Neg Hx     Allergies  Allergen Reactions  . Aspirin Other (See Comments)    REACTION: GI upset/Hernia   .  Ciprofloxacin Other (See Comments)    Tendonitis, joint pain, and nausea.  "Interfered with her HCTZ" (also)  . Codeine Nausea And Vomiting and Other (See Comments)    (Also) GI upset/Hernia (CAN TOLERATE DILAUDID & MORPHINE)  . Eggs Or Egg-Derived Products Nausea Only    Stomach pains  . Fentanyl Nausea Only    Tolerates Dilaudid or morphine much better  . Hydrocodone Nausea And Vomiting  . Lactose Intolerance (Gi) Nausea And Vomiting and Other (See Comments)    (Also) vertigo  . Oxycodone Nausea And Vomiting    Tolerates hydromorphone better  . Penicillins Rash    Has patient had a PCN reaction causing immediate rash, facial/tongue/throat swelling, SOB or lightheadedness with hypotension: Yes Has patient had a PCN reaction causing severe rash involving mucus membranes or skin necrosis: No Has patient had a PCN reaction that required hospitalization No Has patient had a PCN reaction occurring within the last 10 years: No If all of the above answers are "NO", then may proceed with Cephalosporin use.     Outpatient Medications Prior to Visit  Medication Sig Dispense Refill  . albuterol (VENTOLIN HFA) 108 (90 Base) MCG/ACT inhaler Inhale 2 puffs into the lungs every 6 (six) hours as needed for wheezing or shortness of breath. 18 g 3  . cholecalciferol (VITAMIN D) 1000 units tablet Take 1,000 Units by mouth every other day.     Marland Kitchen  ibuprofen (ADVIL,MOTRIN) 200 MG tablet Take 400 mg by mouth every 6 (six) hours as needed for mild pain.    Marland Kitchen lisinopril (ZESTRIL) 10 MG tablet Take 1 tablet po daily 30 tablet 3  . mometasone-formoterol (DULERA) 200-5 MCG/ACT AERO Inhale 2 puffs into the lungs 2 (two) times daily. 13 g 3  . Multiple Vitamin (MULTIVITAMIN WITH MINERALS) TABS tablet Take 1 tablet by mouth every other day.    . Tiotropium Bromide Monohydrate (SPIRIVA RESPIMAT) 2.5 MCG/ACT AERS Inhale 2 puffs into the lungs daily. 4 g 3  . traMADol (ULTRAM) 50 MG tablet Take 1 tablet (50 mg total)  by mouth every 6 (six) hours as needed. 15 tablet 0   No facility-administered medications prior to visit.      ROS Review of Systems  Constitutional: Negative for activity change, appetite change and fatigue.  HENT: Negative for congestion, sinus pressure and sore throat.   Eyes: Negative for visual disturbance.  Respiratory: Negative for cough, chest tightness, shortness of breath and wheezing.   Cardiovascular: Negative for chest pain and palpitations.  Gastrointestinal: Negative for abdominal distention, abdominal pain and constipation.  Endocrine: Negative for polydipsia.  Genitourinary: Negative for dysuria and frequency.  Musculoskeletal: Negative for arthralgias and back pain.  Skin: Negative for rash.  Neurological: Negative for tremors, light-headedness and numbness.  Hematological: Does not bruise/bleed easily.  Psychiatric/Behavioral: Negative for agitation and behavioral problems.    Objective:  BP (!) 162/87   Pulse 74   Temp 98.2 F (36.8 C) (Oral)   Ht '5\' 4"'  (1.626 m)   Wt 149 lb (67.6 kg)   SpO2 97%   BMI 25.58 kg/m   BP/Weight 02/28/2019 06/12/2018 70/48/8891  Systolic BP 694 503 888  Diastolic BP 87 86 86  Wt. (Lbs) 149 141.6 142.8  BMI 25.58 24.31 24.51      Physical Exam Constitutional:      Appearance: She is well-developed.  Cardiovascular:     Rate and Rhythm: Normal rate.     Heart sounds: Normal heart sounds. No murmur.  Pulmonary:     Effort: Pulmonary effort is normal.     Breath sounds: Normal breath sounds. No wheezing or rales.  Chest:     Chest wall: No tenderness.  Abdominal:     General: Bowel sounds are normal. There is no distension.     Palpations: Abdomen is soft. There is no mass.     Tenderness: There is no abdominal tenderness.     Hernia: A hernia (right infra umbilical) is present.  Musculoskeletal: Normal range of motion.  Neurological:     Mental Status: She is alert and oriented to person, place, and time.      CMP Latest Ref Rng & Units 11/22/2017 11/02/2017 11/19/2016  Glucose 65 - 99 mg/dL 80 79 80  BUN 6 - 20 mg/dL 5(L) 7 7  Creatinine 0.44 - 1.00 mg/dL 0.68 0.69 0.76  Sodium 135 - 145 mmol/L 142 143 144  Potassium 3.5 - 5.1 mmol/L 3.6 4.2 4.1  Chloride 101 - 111 mmol/L 107 103 104  CO2 22 - 32 mmol/L '28 26 25  ' Calcium 8.9 - 10.3 mg/dL 9.1 9.8 9.5  Total Protein 6.5 - 8.1 g/dL 6.3(L) 7.2 -  Total Bilirubin 0.3 - 1.2 mg/dL 0.4 0.3 -  Alkaline Phos 38 - 126 U/L 69 92 -  AST 15 - 41 U/L 25 20 -  ALT 14 - 54 U/L 24 23 -    Lipid Panel  Component Value Date/Time   CHOL 184 06/28/2017 1157   TRIG 112 06/28/2017 1157   HDL 52 06/28/2017 1157   CHOLHDL 3.5 06/28/2017 1157   CHOLHDL 4.7 05/09/2013 1445   VLDL 32 05/09/2013 1445   LDLCALC 110 (H) 06/28/2017 1157    CBC    Component Value Date/Time   WBC 8.0 11/22/2017 1244   RBC 4.82 11/22/2017 1244   HGB 13.7 11/22/2017 1244   HGB 14.3 11/02/2017 1621   HCT 42.0 11/22/2017 1244   HCT 44.0 11/02/2017 1621   PLT 283 11/22/2017 1244   PLT 375 11/02/2017 1621   MCV 87.1 11/22/2017 1244   MCV 87 11/02/2017 1621   MCH 28.4 11/22/2017 1244   MCHC 32.6 11/22/2017 1244   RDW 13.9 11/22/2017 1244   RDW 14.6 11/02/2017 1621   LYMPHSABS 2.5 11/02/2017 1621   MONOABS 0.6 09/02/2016 1955   EOSABS 0.3 11/02/2017 1621   BASOSABS 0.0 11/02/2017 1621    Lab Results  Component Value Date   HGBA1C 5.6 08/11/2015    Assessment & Plan:   1. Essential hypertension Uncontrolled Repeat blood pressure performed manually was 149/85 Low-dose amlodipine added We will review blood pressure at next visit Counseled on blood pressure goal of less than 130/80, low-sodium, DASH diet, medication compliance, 150 minutes of moderate intensity exercise per week. Discussed medication compliance, adverse effects. - CMP14+EGFR - amLODipine (NORVASC) 2.5 MG tablet; Take 1 tablet (2.5 mg total) by mouth daily.  Dispense: 90 tablet; Refill: 1  2.  Tobacco abuse Smoking cessation support: smoking cessation hotline: 1-800-QUIT-NOW.  Smoking cessation classes are available through [facility]. Call [phone] [phone] or visit our website at [email or web address]  Spent 3 minutes counseling on dangers of tobacco use and benefits of quitting and patient is not ready to quit.   3. Centrilobular emphysema (HCC) Stable, no recent exacerbations Continue Dulera, Spiriva and Proventil Follow-up with pulmonary   Health Care Maintenance: Next visit Meds ordered this encounter  Medications  . amLODipine (NORVASC) 2.5 MG tablet    Sig: Take 1 tablet (2.5 mg total) by mouth daily.    Dispense:  90 tablet    Refill:  1    Follow-up: Return for Medicare physical.       Charlott Rakes, MD, FAAFP. [facility] [place], [place]   02/28/2019, 5:47 PM

## 2019-02-28 NOTE — Patient Instructions (Signed)

## 2019-03-01 LAB — CMP14+EGFR
ALT: 14 IU/L (ref 0–32)
AST: 15 IU/L (ref 0–40)
Albumin/Globulin Ratio: 2.2 (ref 1.2–2.2)
Albumin: 4.6 g/dL (ref 3.8–4.9)
Alkaline Phosphatase: 89 IU/L (ref 39–117)
BUN/Creatinine Ratio: 12 (ref 12–28)
BUN: 11 mg/dL (ref 8–27)
Bilirubin Total: <0.2 mg/dL (ref 0.0–1.2)
CO2: 28 mmol/L (ref 20–29)
Calcium: 9.8 mg/dL (ref 8.7–10.3)
Chloride: 103 mmol/L (ref 96–106)
Creatinine, Ser: 0.89 mg/dL (ref 0.57–1.00)
GFR calc Af Amer: 81 mL/min/{1.73_m2} (ref >59)
GFR calc non Af Amer: 71 mL/min/{1.73_m2} (ref >59)
Globulin, Total: 2.1 g/dL (ref 1.5–4.5)
Glucose: 77 mg/dL (ref 65–99)
Potassium: 4.6 mmol/L (ref 3.5–5.2)
Sodium: 143 mmol/L (ref 134–144)
Total Protein: 6.7 g/dL (ref 6.0–8.5)

## 2019-03-13 ENCOUNTER — Other Ambulatory Visit: Payer: Self-pay | Admitting: *Deleted

## 2019-03-13 NOTE — Patient Outreach (Signed)
[name] Care Management  03/13/2019  Maria Oliver 02-10-1961 [id number]  RN Health Coach attempted follow up outreach call to patient.  Patient was unavailable. HIPPA compliance voicemail message left with return callback number. RN also sent a text as the patient voicemail stated that her phone will not ring.  Plan: RN will call patient again within 30 days.  [place] Care Management [phone]

## 2019-03-16 ENCOUNTER — Other Ambulatory Visit: Payer: Self-pay | Admitting: *Deleted

## 2019-03-21 NOTE — Patient Outreach (Signed)
[facility]) Care Management  03/16/2019  Maria Oliver 03-05-1961 [id number]  RN Health Coach telephone call to patient.  Hipaa compliance verified. Per patient she s still smoking. She is currently on Dulera, Spiriva and proventil.  Patient is taking her medications as per ordered. Patient does have a non productive cough.  Patient ahs agreed to follow up outreach calls.   Current Medications:  Current Outpatient Medications  Medication Sig Dispense Refill  . albuterol (VENTOLIN HFA) 108 (90 Base) MCG/ACT inhaler Inhale 2 puffs into the lungs every 6 (six) hours as needed for wheezing or shortness of breath. 18 g 3  . amLODipine (NORVASC) 2.5 MG tablet Take 1 tablet (2.5 mg total) by mouth daily. 90 tablet 1  . cholecalciferol (VITAMIN D) 1000 units tablet Take 1,000 Units by mouth every other day.     . ibuprofen (ADVIL,MOTRIN) 200 MG tablet Take 400 mg by mouth every 6 (six) hours as needed for mild pain.    Marland Kitchen lisinopril (ZESTRIL) 10 MG tablet Take 1 tablet po daily 30 tablet 3  . mometasone-formoterol (DULERA) 200-5 MCG/ACT AERO Inhale 2 puffs into the lungs 2 (two) times daily. 13 g 3  . Multiple Vitamin (MULTIVITAMIN WITH MINERALS) TABS tablet Take 1 tablet by mouth every other day.    . Tiotropium Bromide Monohydrate (SPIRIVA RESPIMAT) 2.5 MCG/ACT AERS Inhale 2 puffs into the lungs daily. 4 g 3  . traMADol (ULTRAM) 50 MG tablet Take 1 tablet (50 mg total) by mouth every 6 (six) hours as needed. 15 tablet 0   No current facility-administered medications for this visit.     Functional Status:  In your present state of health, do you have any difficulty performing the following activities: 03/16/2019 12/11/2018  Hearing? N N  Vision? Y Y  Comment per patient she needs to schedule eye exam because she needs new glasses -  Difficulty concentrating or making decisions? N N  Walking or climbing stairs? Y Y  Comment patient gets short of breath with moderate exertion  Shortness of breath on moderate exertion  Dressing or bathing? N N  Doing errands, shopping? Y Y  Preparing Food and eating ? - N  Using the Toilet? N N  In the past six months, have you accidently leaked urine? N N  Do you have problems with loss of bowel control? N N  Managing your Medications? N N  Managing your Finances? N N  Housekeeping or managing your Housekeeping? N N  Some recent data might be hidden    Fall/Depression Screening: Fall Risk  03/16/2019 02/28/2019 02/13/2019  Falls in the past year? 0 0 0  Number falls in past yr: 0 - -  Injury with Fall? 0 - -  Follow up Falls evaluation completed - -   PHQ 2/9 Scores 03/16/2019 02/13/2019 12/11/2018 07/19/2018 02/06/2018 01/26/2018 12/13/2017  PHQ - 2 Score 0 0 4 4 4 6 6   PHQ- 9 Score - - 8 8 12 10 10     Assessment:  Patient is able to afford medications Patient has inhalers and is adhering as per ordered Patient is using pandemic safety precautions  Plan:  Patient will follow up with scheduling eye exam RN discussed COPD exacerbation and zones RN reiterated Health Maintenance RN will follow up outreach within the month of December  Maria Oliver [place] Management [phone]

## 2019-04-09 ENCOUNTER — Other Ambulatory Visit: Payer: Self-pay

## 2019-04-09 ENCOUNTER — Encounter: Payer: Self-pay | Admitting: Family Medicine

## 2019-04-09 ENCOUNTER — Ambulatory Visit: Payer: Medicare Other | Attending: Family Medicine | Admitting: Family Medicine

## 2019-04-09 VITALS — BP 152/91 | HR 73 | Temp 98.2°F | Resp 18 | Ht 64.0 in | Wt 149.0 lb

## 2019-04-09 DIAGNOSIS — F17210 Nicotine dependence, cigarettes, uncomplicated: Secondary | ICD-10-CM

## 2019-04-09 DIAGNOSIS — Z0001 Encounter for general adult medical examination with abnormal findings: Secondary | ICD-10-CM

## 2019-04-09 DIAGNOSIS — Z0000 Encounter for general adult medical examination without abnormal findings: Secondary | ICD-10-CM

## 2019-04-09 DIAGNOSIS — R21 Rash and other nonspecific skin eruption: Secondary | ICD-10-CM

## 2019-04-09 DIAGNOSIS — Z1231 Encounter for screening mammogram for malignant neoplasm of breast: Secondary | ICD-10-CM

## 2019-04-09 DIAGNOSIS — Z720 Tobacco use: Secondary | ICD-10-CM

## 2019-04-09 NOTE — Patient Instructions (Signed)
Health Maintenance, Female Adopting a healthy lifestyle and getting preventive care are important in promoting health and wellness. Ask your health care provider about:  The right schedule for you to have regular tests and exams.  Things you can do on your own to prevent diseases and keep yourself healthy. What should I know about diet, weight, and exercise? Eat a healthy diet   Eat a diet that includes plenty of vegetables, fruits, low-fat dairy products, and lean protein.  Do not eat a lot of foods that are high in solid fats, added sugars, or sodium. Maintain a healthy weight Body mass index (BMI) is used to identify weight problems. It estimates body fat based on height and weight. Your health care provider can help determine your BMI and help you achieve or maintain a healthy weight. Get regular exercise Get regular exercise. This is one of the most important things you can do for your health. Most adults should:  Exercise for at least 150 minutes each week. The exercise should increase your heart rate and make you sweat (moderate-intensity exercise).  Do strengthening exercises at least twice a week. This is in addition to the moderate-intensity exercise.  Spend less time sitting. Even light physical activity can be beneficial. Watch cholesterol and blood lipids Have your blood tested for lipids and cholesterol at 60 years of age, then have this test every 5 years. Have your cholesterol levels checked more often if:  Your lipid or cholesterol levels are high.  You are older than 60 years of age.  You are at high risk for heart disease. What should I know about cancer screening? Depending on your health history and family history, you may need to have cancer screening at various ages. This may include screening for:  Breast cancer.  Cervical cancer.  Colorectal cancer.  Skin cancer.  Lung cancer. What should I know about heart disease, diabetes, and high blood  pressure? Blood pressure and heart disease  High blood pressure causes heart disease and increases the risk of stroke. This is more likely to develop in people who have high blood pressure readings, are of African descent, or are overweight.  Have your blood pressure checked: ? Every 3-5 years if you are 18-39 years of age. ? Every year if you are 40 years old or older. Diabetes Have regular diabetes screenings. This checks your fasting blood sugar level. Have the screening done:  Once every three years after age 40 if you are at a normal weight and have a low risk for diabetes.  More often and at a younger age if you are overweight or have a high risk for diabetes. What should I know about preventing infection? Hepatitis B If you have a higher risk for hepatitis B, you should be screened for this virus. Talk with your health care provider to find out if you are at risk for hepatitis B infection. Hepatitis C Testing is recommended for:  Everyone born from 1945 through 1965.  Anyone with known risk factors for hepatitis C. Sexually transmitted infections (STIs)  Get screened for STIs, including gonorrhea and chlamydia, if: ? You are sexually active and are younger than 60 years of age. ? You are older than 60 years of age and your health care provider tells you that you are at risk for this type of infection. ? Your sexual activity has changed since you were last screened, and you are at increased risk for chlamydia or gonorrhea. Ask your health care provider if   you are at risk.  Ask your health care provider about whether you are at high risk for HIV. Your health care provider may recommend a prescription medicine to help prevent HIV infection. If you choose to take medicine to prevent HIV, you should first get tested for HIV. You should then be tested every 3 months for as long as you are taking the medicine. Pregnancy  If you are about to stop having your period (premenopausal) and  you may become pregnant, seek counseling before you get pregnant.  Take 400 to 800 micrograms (mcg) of folic acid every day if you become pregnant.  Ask for birth control (contraception) if you want to prevent pregnancy. Osteoporosis and menopause Osteoporosis is a disease in which the bones lose minerals and strength with aging. This can result in bone fractures. If you are 65 years old or older, or if you are at risk for osteoporosis and fractures, ask your health care provider if you should:  Be screened for bone loss.  Take a calcium or vitamin D supplement to lower your risk of fractures.  Be given hormone replacement therapy (HRT) to treat symptoms of menopause. Follow these instructions at home: Lifestyle  Do not use any products that contain nicotine or tobacco, such as cigarettes, e-cigarettes, and chewing tobacco. If you need help quitting, ask your health care provider.  Do not use street drugs.  Do not share needles.  Ask your health care provider for help if you need support or information about quitting drugs. Alcohol use  Do not drink alcohol if: ? Your health care provider tells you not to drink. ? You are pregnant, may be pregnant, or are planning to become pregnant.  If you drink alcohol: ? Limit how much you use to 0-1 drink a day. ? Limit intake if you are breastfeeding.  Be aware of how much alcohol is in your drink. In the [place] one drink equals one 12 oz bottle of beer (355 mL), one 5 oz glass of wine (148 mL), or one 1 oz glass of hard liquor (44 mL). General instructions  Schedule regular health, dental, and eye exams.  Stay current with your vaccines.  Tell your health care provider if: ? You often feel depressed. ? You have ever been abused or do not feel safe at home. Summary  Adopting a healthy lifestyle and getting preventive care are important in promoting health and wellness.  Follow your health care provider's instructions about healthy  diet, exercising, and getting tested or screened for diseases.  Follow your health care provider's instructions on monitoring your cholesterol and blood pressure. This information is not intended to replace advice given to you by your health care provider. Make sure you discuss any questions you have with your health care provider. Document Released: 12/14/2010 Document Revised: 05/24/2018 Document Reviewed: 05/24/2018 Elsevier Patient Education  2020 Elsevier Inc.  

## 2019-04-09 NOTE — Progress Notes (Signed)
Subjective:   Maria Oliver is a 58 y.o. female who presents for Medicare Annual (Subsequent) preventive examination.  She is concerned about a rash on the left mid back which has been present for several years and she thinks it is increasing in size.  Rash is not pruritic, it is not painful and she would like this checked out to ensure it is not malignant.  Review of Systems:  General: negative for fever, weight loss, appetite change Eyes: no visual symptoms. ENT: no ear symptoms, no sinus tenderness, no nasal congestion or sore throat. Neck: no pain  Respiratory: no wheezing, shortness of breath, cough Cardiovascular: no chest pain, no dyspnea on exertion, no pedal edema, no orthopnea. Gastrointestinal: no abdominal pain, no diarrhea, no constipation Genito-Urinary: no urinary frequency, no dysuria, no polyuria. Hematologic: no bruising Endocrine: no cold or heat intolerance Neurological: no headaches, no seizures, no tremors Musculoskeletal: no joint pains, no joint swelling Skin: no pruritus, + rash. Psychological: no depression, no anxiety,          Objective:     Vitals: BP (!) 152/91 (BP Location: Left Arm, Patient Position: Sitting, Cuff Size: Normal)   Pulse 73   Temp 98.2 F (36.8 C) (Oral)   Resp 18   Ht 5\' 4"  (1.626 m)   Wt 149 lb (67.6 kg)   SpO2 99%   BMI 25.58 kg/m   Body mass index is 25.58 kg/m.   Constitutional: normal appearing,  Eyes: PERRLA HEENT: Head is atraumatic, normal sinuses, normal oropharynx, normal appearing tonsils and palate, tympanic membrane is normal bilaterally. Neck: normal range of motion, no thyromegaly, no JVD Cardiovascular: normal rate and rhythm, normal heart sounds, no murmurs, rub or gallop, no pedal edema Respiratory: Normal breath sounds, clear to auscultation bilaterally, no wheezes, no rales, no rhonchi Abdomen: soft, not tender to palpation, normal bowel sounds, no enlarged organs Musculoskeletal: Full ROM, no  tenderness in joints Skin: Hyperpigmented papule left mid back which is not tender Neurological: alert, oriented x3, cranial nerves I-XII grossly intact , normal motor strength, normal sensation. Psychological: normal mood.   Advanced Directives 04/09/2019 11/28/2017 01/07/2017 11/19/2016 10/14/2016 09/16/2016 09/07/2016  Does Patient Have a Medical Advance Directive? No No No No No No No  Would patient like information on creating a medical advance directive? Yes (MAU/Ambulatory/Procedural Areas - Information given) No - Patient declined No - Patient declined - - - No - Patient declined  She would like her son Maria Oliver to be her healthcare power of attorney  Tobacco Social History   Tobacco Use  Smoking Status Current Every Day Smoker  . Packs/day: 1.00  . Years: 47.00  . Pack years: 47.00  . Types: Cigarettes  Smokeless Tobacco Never Used  Tobacco Comment   5 cigarettes/day 11/28/17     Ready to quit: No Counseling given: Yes Comment: 5 cigarettes/day 11/28/17   Clinical Intake:  Pre-visit preparation completed: Yes  Pain : No/denies pain Pain Score: 0-No pain     Diabetes: No           Past Medical History:  Diagnosis Date  . Abdominal pain   . Anxiety   . Arthritis   . Asthma   . Chronic cholecystitis with calculus s/p lap cholecystectomy 09/09/2016 09/09/2016  . Complication of anesthesia    slow to arouse post operatively   . COPD (chronic obstructive pulmonary disease) (HCC)   . 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 Generalized headaches   .  Hernia, inguinal, right   . Hypertension   . Leg swelling    Past Surgical History:  Procedure Laterality Date  . COLON SURGERY  2010   per patient "colon take down"  . COLOSTOMY  2010  . LAPAROSCOPIC CHOLECYSTECTOMY SINGLE SITE WITH INTRAOPERATIVE CHOLANGIOGRAM N/A 09/09/2016   Procedure: LAPAROSCOPIC CHOLECYSTECTOMY  WITH INTRAOPERATIVE CHOLANGIOGRAM;  Surgeon: Michael Boston, MD;  Location: [facility];  Service: General;  Laterality: N/A;  . TUBAL LIGATION     Family History  Problem Relation Age of Onset  . Heart disease Mother 51  . Heart failure Mother   . Asthma Mother   . Emphysema Mother   . Alzheimer's disease Father 56  . Emphysema Brother   . Asthma Brother   . Colon cancer Neg Hx    Social History   Socioeconomic History  . Marital status: Widowed    Spouse name: Not on file  . Number of children: 3  . Years of education: Not on file  . Highest education level: Not on file  Occupational History  . Occupation: disabled  Social Needs  . Financial resource strain: Not on file  . Food insecurity    Worry: Not on file    Inability: Not on file  . Transportation needs    Medical: Not on file    Non-medical: Not on file  Tobacco Use  . Smoking status: Current Every Day Smoker    Packs/day: 1.00    Years: 47.00    Pack years: 47.00    Types: Cigarettes  . Smokeless tobacco: Never Used  . Tobacco comment: 5 cigarettes/day 11/28/17  Substance and Sexual Activity  . Alcohol use: No    Alcohol/week: 0.0 standard drinks  . Drug use: No  . Sexual activity: Yes  Lifestyle  . Physical activity    Days per week: Not on file    Minutes per session: Not on file  . Stress: Not on file  Relationships  . Social Herbalist on phone: Not on file    Gets together: Not on file    Attends religious service: Not on file    Active member of club or organization: Not on file    Attends meetings of clubs or organizations: Not on file    Relationship status: Not on file  Other Topics Concern  . Not on file  Social History Narrative  . Not on file    Outpatient Encounter Medications as of 04/09/2019  Medication Sig  . albuterol (VENTOLIN HFA) 108 (90 Base) MCG/ACT inhaler Inhale 2 puffs into the lungs every 6 (six) hours as needed for wheezing or shortness of breath.  Marland Kitchen amLODipine (NORVASC) 2.5 MG tablet Take 1 tablet (2.5 mg total)  by mouth daily.  . cholecalciferol (VITAMIN D) 1000 units tablet Take 1,000 Units by mouth every other day.   . ibuprofen (ADVIL,MOTRIN) 200 MG tablet Take 400 mg by mouth every 6 (six) hours as needed for mild pain.  Marland Kitchen lisinopril (ZESTRIL) 10 MG tablet Take 1 tablet po daily  . mometasone-formoterol (DULERA) 200-5 MCG/ACT AERO Inhale 2 puffs into the lungs 2 (two) times daily.  . Multiple Vitamin (MULTIVITAMIN WITH MINERALS) TABS tablet Take 1 tablet by mouth every other day.  . Tiotropium Bromide Monohydrate (SPIRIVA RESPIMAT) 2.5 MCG/ACT AERS Inhale 2 puffs into the lungs daily.  . traMADol (ULTRAM) 50 MG tablet Take 1 tablet (50 mg total) by mouth every 6 (six) hours as needed.   No  facility-administered encounter medications on file as of 04/09/2019.     Activities of Daily Living In your present state of health, do you have any difficulty performing the following activities: 04/09/2019 03/16/2019  Hearing? N N  Vision? N Y  Comment - per patient she needs to schedule eye exam because she needs new glasses  Difficulty concentrating or making decisions? N N  Walking or climbing stairs? N Y  Comment - patient gets short of breath with moderate exertion  Dressing or bathing? N N  Doing errands, shopping? N Y  Quarry manager and eating ? - -  Using the Toilet? - N  In the past six months, have you accidently leaked urine? - N  Do you have problems with loss of bowel control? - N  Managing your Medications? - N  Managing your Finances? - N  Housekeeping or managing your Housekeeping? - N  Some recent data might be hidden    Patient Care Team: Hoy Register, MD as PCP - General (Family Medicine) Jeani Hawking, MD as Consulting Physician (Gastroenterology) Karie Soda, MD as Consulting Physician (General Surgery) Bradly Bienenstock, MD as Consulting Physician (Orthopedic Surgery) Pleasant, Dennard Schaumann, RN as [facility] Care Management    Assessment:   This is a routine  wellness examination for Maria Oliver.  Exercise Activities and Dietary recommendations    Goals    . Quit smoking / using tobacco       Fall Risk Fall Risk  04/09/2019 03/16/2019 02/28/2019 02/13/2019 12/11/2018  Falls in the past year? 0 0 0 0 0  Number falls in past yr: - 0 - - 0  Injury with Fall? - 0 - - -  Follow up Falls evaluation completed Falls evaluation completed - - -   Is the patient's home free of loose throw rugs in walkways, pet beds, electrical cords, etc?   yes      Grab bars in the bathroom? no      Handrails on the stairs?   n/a      Adequate lighting?   yes  Timed Get Up and Go performed: yes  Depression Screen PHQ 2/9 Scores 04/09/2019 03/16/2019 02/13/2019 12/11/2018  PHQ - 2 Score 2 0 0 4  PHQ- 9 Score 3 - - 8      Office Visit from 04/09/2019 in [facility]  PHQ-9 Total Score  3      Cognitive Function    Normal    Immunization History  Administered Date(s) Administered  . Tdap 10/07/2013    Qualifies for Shingles Vaccine?yes  Screening Tests Health Maintenance  Topic Date Due  . COLONOSCOPY  03/04/2019  . INFLUENZA VACCINE  09/12/2019 (Originally 01/13/2019)  . MAMMOGRAM  12/08/2019  . PAP SMEAR-Modifier  11/02/2020  . TETANUS/TDAP  10/08/2023  . Hepatitis C Screening  Completed  . HIV Screening  Completed    Cancer Screenings: Lung: Low Dose CT Chest recommended if Age 28-80 years, 30 pack-year currently smoking OR have quit w/in 15years. Patient does qualify. Breast:  Up to date on Mammogram? No   Up to date of Bone Density/Dexa? No; n/a Colorectal: no  Additional Screenings:  Hepatitis C Screening: up to date     Plan:    1. Encounter for Medicare annual wellness exam Counseled on 150 minutes of exercise per week, healthy eating (including decreased daily intake of saturated fats, cholesterol, added sugars, sodium)- Ambulatory referral to Gastroenterology - MM Digital Screening; Future  2. Skin  rash  She will need an excisional biopsy to exclude malignancy - Ambulatory referral to Dermatology  3. Tobacco abuse Spent 3 minutes counseling on cessation and she is not ready to quit Low-dose chest CT to screen for lung cancer was negative in 12/2017 Advised she needs an annual chest CT however she declines at this time  4. Encounter for screening mammogram for malignant neoplasm of breast Referred for mammogram   I have personally reviewed and noted the following in the patient's chart:   . Medical and social history . Use of alcohol, tobacco or illicit drugs  . Current medications and supplements . Functional ability and status . Nutritional status . Physical activity . Advanced directives . List of other physicians . Hospitalizations, surgeries, and ER visits in previous 12 months . Vitals . Screenings to include cognitive, depression, and falls . Referrals and appointments  In addition, I have reviewed and discussed with patient certain preventive protocols, quality metrics, and best practice recommendations. A written personalized care plan for preventive services as well as general preventive health recommendations were provided to patient.     Hoy Register, MD  04/09/2019

## 2019-04-09 NOTE — Progress Notes (Signed)
Patient takes BP medications after dinner due to causing drowsiness during the day.

## 2019-04-12 ENCOUNTER — Ambulatory Visit: Payer: Self-pay | Admitting: *Deleted

## 2019-05-29 ENCOUNTER — Other Ambulatory Visit: Payer: Self-pay | Admitting: Physician Assistant

## 2019-05-29 ENCOUNTER — Other Ambulatory Visit: Payer: Self-pay | Admitting: *Deleted

## 2019-05-29 DIAGNOSIS — I10 Essential (primary) hypertension: Secondary | ICD-10-CM

## 2019-05-29 NOTE — Patient Outreach (Signed)
[facility]) Care Management  [place]  05/29/2019   Maria Oliver 1961/04/22 [id number]  RN Health Coach telephone call to patient.  [employer] compliance verified. Per patient she is doing good with her brathing. She is taking medications as prescribed. Patient blood pressure has elevated. Patient stated that she has a monitor but she needs batteries. RN discussed monitoring blood pressure and documenting. Patient has agreed to further outreach calls.  Encounter Medications:  Outpatient Encounter Medications as of 05/29/2019  Medication Sig  . albuterol (VENTOLIN HFA) 108 (90 Base) MCG/ACT inhaler Inhale 2 puffs into the lungs every 6 (six) hours as needed for wheezing or shortness of breath.  Marland Kitchen amLODipine (NORVASC) 2.5 MG tablet Take 1 tablet (2.5 mg total) by mouth daily.  . cholecalciferol (VITAMIN D) 1000 units tablet Take 1,000 Units by mouth every other day.   . ibuprofen (ADVIL,MOTRIN) 200 MG tablet Take 400 mg by mouth every 6 (six) hours as needed for mild pain.  Marland Kitchen lisinopril (ZESTRIL) 10 MG tablet Take 1 tablet po daily  . mometasone-formoterol (DULERA) 200-5 MCG/ACT AERO Inhale 2 puffs into the lungs 2 (two) times daily.  . Multiple Vitamin (MULTIVITAMIN WITH MINERALS) TABS tablet Take 1 tablet by mouth every other day.  . Tiotropium Bromide Monohydrate (SPIRIVA RESPIMAT) 2.5 MCG/ACT AERS Inhale 2 puffs into the lungs daily.  . traMADol (ULTRAM) 50 MG tablet Take 1 tablet (50 mg total) by mouth every 6 (six) hours as needed.   No facility-administered encounter medications on file as of 05/29/2019.    Functional Status:  In your present state of health, do you have any difficulty performing the following activities: 04/09/2019 03/16/2019  Hearing? N N  Vision? N Y  Comment - per patient she needs to schedule eye exam because she needs new glasses  Difficulty concentrating or making decisions? N N  Walking or climbing stairs? N Y  Comment - patient  gets short of breath with moderate exertion  Dressing or bathing? N N  Doing errands, shopping? N Y  Conservation officer, nature and eating ? - -  Using the Toilet? - N  In the past six months, have you accidently leaked urine? - N  Do you have problems with loss of bowel control? - N  Managing your Medications? - N  Managing your Finances? - N  Housekeeping or managing your Housekeeping? - N  Some recent data might be hidden    Fall/Depression Screening: Fall Risk  05/29/2019 04/09/2019 03/16/2019  Falls in the past year? 0 0 0  Number falls in past yr: 0 - 0  Injury with Fall? 0 - 0  Follow up Falls evaluation completed Falls evaluation completed Falls evaluation completed   PHQ 2/9 Scores 05/29/2019 04/09/2019 03/16/2019 02/13/2019 12/11/2018 07/19/2018 02/06/2018  PHQ - 2 Score 2 2 0 0 4 4 4   PHQ- 9 Score 3 3 - - 8 8 12    THN CM Care Plan Problem One     Most Recent Value  Care Plan Problem One  Knowledge Deficit in Self Management of COPD  Role Documenting the Problem One  [place] for Problem One  Active [Emmi down]  THN Long Term Goal   Patient with not have any readmission within the next 90 days for COPD exacerbation  THN Long Term Goal Start Date  05/29/19  Interventions for Problem One Long Term Goal  RN reiterates  the zones and action plan, especially during the pandemic.RN  follows up with patient to make sure she has the inahlaers need, RN will follow up for further discussion and support.  THN CM Short Term Goal #1   patient will follow up with health maintenance care within the next 30 days  THN CM Short Term Goal #1 Start Date  05/29/19  Interventions for Short Term Goal #1  RN reiterates the importance of health maintenance such as her eye exam, lung scans. RN will continue to follow up for compliance  THN CM Short Term Goal #2   Patient will be able to verbalize what purselip breathing is within the next 30 days  THN CM Short Term Goal #2 Start Date  05/29/19  [facility]  CM Short Term Goal #3  Patient will verbalize receiving information on hypertension within the next 30 days  THN CM Short Term Goal #3 Start Date  05/30/19  Interventions for Short Tern Goal #3  RN discussed with patient that her blood pressure is elevating. RN sent a matter of choice blood pressure book. Patient will get batteries for monitor and check daily. RN sent a 2021 calendar book for documentation. RN sent a face pitcure sheet of foods high and low in sodium. RN will follow up for further discussion       Assessment:   Patient is in the green zone [name] Patient is taking medications as prescribed Patient blood pressure has been elevated Patient monitor needs batteries Patient is taking pandemic precautions  Plan:  RN reiterated COPD zones and action plan RN reiterated medication adherence RN sent 2021 Calendar book RN discussed hypertension RN sent A matter of choice blood pressure control booklet RN will follow up outreach within the month of March  Gean Maidens BSN RN [facility] Care Management [phone]

## 2019-06-28 ENCOUNTER — Other Ambulatory Visit: Payer: Self-pay | Admitting: *Deleted

## 2019-06-28 DIAGNOSIS — F17210 Nicotine dependence, cigarettes, uncomplicated: Secondary | ICD-10-CM

## 2019-07-09 ENCOUNTER — Ambulatory Visit (HOSPITAL_COMMUNITY)
Admission: EM | Admit: 2019-07-09 | Discharge: 2019-07-09 | Disposition: A | Discharge status: Home / Self Care | Payer: Medicare Other | Attending: Internal Medicine | Admitting: Internal Medicine

## 2019-07-09 ENCOUNTER — Encounter (HOSPITAL_COMMUNITY): Payer: Self-pay

## 2019-07-09 ENCOUNTER — Other Ambulatory Visit: Payer: Self-pay

## 2019-07-09 DIAGNOSIS — Z79899 Other long term (current) drug therapy: Secondary | ICD-10-CM | POA: Diagnosis not present

## 2019-07-09 DIAGNOSIS — M5440 Lumbago with sciatica, unspecified side: Secondary | ICD-10-CM | POA: Diagnosis not present

## 2019-07-09 DIAGNOSIS — R109 Unspecified abdominal pain: Secondary | ICD-10-CM | POA: Insufficient documentation

## 2019-07-09 DIAGNOSIS — R350 Frequency of micturition: Secondary | ICD-10-CM

## 2019-07-09 DIAGNOSIS — M25551 Pain in right hip: Secondary | ICD-10-CM | POA: Insufficient documentation

## 2019-07-09 LAB — POCT URINALYSIS DIP (DEVICE)
Bilirubin Urine: NEGATIVE
Glucose, UA: NEGATIVE mg/dL
Ketones, ur: NEGATIVE mg/dL
Leukocytes,Ua: NEGATIVE
Nitrite: NEGATIVE
Protein, ur: NEGATIVE mg/dL
Specific Gravity, Urine: 1.01 (ref 1.005–1.030)
Urobilinogen, UA: 0.2 mg/dL (ref 0.0–1.0)
pH: 6 (ref 5.0–8.0)

## 2019-07-09 MED ORDER — CYCLOBENZAPRINE HCL 10 MG PO TABS: 10.0000 mg | ORAL_TABLET | Freq: Two times a day (BID) | ORAL | 0 refills | Status: DC | PRN

## 2019-07-09 MED ORDER — DEXAMETHASONE SODIUM PHOSPHATE 10 MG/ML IJ SOLN
10.0000 mg | Freq: Once | INTRAMUSCULAR | Status: AC
  Administered 2019-07-09: 19:00:00 10 mg via INTRAMUSCULAR

## 2019-07-09 MED ORDER — IBUPROFEN 600 MG PO TABS: 600.0000 mg | ORAL_TABLET | Freq: Four times a day (QID) | ORAL | 0 refills | Status: DC | PRN

## 2019-07-09 MED ORDER — DEXAMETHASONE SODIUM PHOSPHATE 10 MG/ML IJ SOLN
INTRAMUSCULAR | Status: AC
  Filled 2019-07-09: qty 1

## 2019-07-09 MED ORDER — TRAMADOL HCL 50 MG PO TABS: 50.0000 mg | ORAL_TABLET | Freq: Four times a day (QID) | ORAL | 0 refills | Status: DC | PRN

## 2019-07-09 NOTE — ED Provider Notes (Signed)
[facility]    CSN: [id number] Arrival date & time: 07/09/19  1633      History   Chief Complaint Chief Complaint  Patient presents with  . Hip Pain  . Abdominal Pain    HPI Maria Oliver is a 59 y.o. female comes to urgent care with complaints of right hip pain, right flank pain, lower abdominal pain and says that she thinks she may have a urinary tract infection.  Patient denies any fever or chills.  She admits to having some urinary frequency with no dysuria or urgency.  No dizziness, nausea or vomiting.   Patient describes right back pain which is sharp, worsened by movement and radiates to the right thigh.  No numbness or tingling.  She has not tried any over-the-counter medications.  HPI  Past Medical History:  Diagnosis Date  . Abdominal pain   . Anxiety   . Arthritis   . Asthma   . Chronic cholecystitis with calculus s/p lap cholecystectomy 09/09/2016 09/09/2016  . Complication of anesthesia    slow to arouse post operatively   . COPD (chronic obstructive pulmonary disease) ([place])   . 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 Generalized headaches   . Hernia, inguinal, right   . Hypertension   . Leg swelling     Patient Active Problem List   Diagnosis Date Noted  . 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 Pruritic rash 10/14/2016  . Chronic narcotic use 09/09/2016  . Onychomycosis of right great toe 10/13/2015  . Osteoarthritis 08/11/2015  . Chronic abdominal pain 05/22/2015  . Chronic pain syndrome 05/22/2015  . Vitamin D insufficiency 02/06/2015  . Chronic knee pain 02/06/2015  . Ventral hernia without obstruction or gangrene 12/31/2014  . Anxiety 12/31/2014  . COPD (chronic obstructive pulmonary disease) with emphysema ([place])   . Tobacco abuse 10/01/2009  . Essential hypertension 10/01/2009    Past Surgical History:  Procedure Laterality Date  . COLON SURGERY  2010   per patient  "colon take down"  . COLOSTOMY  2010  . LAPAROSCOPIC CHOLECYSTECTOMY SINGLE SITE WITH INTRAOPERATIVE CHOLANGIOGRAM N/A 09/09/2016   Procedure: LAPAROSCOPIC CHOLECYSTECTOMY  WITH INTRAOPERATIVE CHOLANGIOGRAM;  Surgeon: Michael Boston, MD;  Location: [facility];  Service: General;  Laterality: N/A;  . TUBAL LIGATION      OB History   No obstetric history on file.      Home Medications    Prior to Admission medications   Medication Sig Start Date End Date Taking? Authorizing Provider  albuterol (VENTOLIN HFA) 108 (90 Base) MCG/ACT inhaler Inhale 2 puffs into the lungs every 6 (six) hours as needed for wheezing or shortness of breath. 02/13/19   Argentina Donovan, PA-C  amLODipine (NORVASC) 2.5 MG tablet Take 1 tablet (2.5 mg total) by mouth daily. 02/28/19   Charlott Rakes, MD  cholecalciferol (VITAMIN D) 1000 units tablet Take 1,000 Units by mouth every other day.     [provider]  cyclobenzaprine (FLEXERIL) 10 MG tablet Take 1 tablet (10 mg total) by mouth 2 (two) times daily as needed for muscle spasms. 07/09/19   Chase Picket, MD  ibuprofen (ADVIL) 600 MG tablet Take 1 tablet (600 mg total) by mouth every 6 (six) hours as needed. 07/09/19   Chase Picket, MD  lisinopril (ZESTRIL) 10 MG tablet TAKE 1 TABLET BY MOUTH DAILY 05/30/19   Charlott Rakes, MD  metoprolol succinate (TOPROL XL) 25 MG 24 hr tablet Take  1 tablet (25 mg total) by mouth daily. 07/11/19   Hoy Register, MD  mometasone-formoterol (DULERA) 200-5 MCG/ACT AERO Inhale 2 puffs into the lungs 2 (two) times daily. 02/13/19   Anders Simmonds, PA-C  Multiple Vitamin (MULTIVITAMIN WITH MINERALS) TABS tablet Take 1 tablet by mouth every other day.    [provider]  Tiotropium Bromide Monohydrate (SPIRIVA RESPIMAT) 2.5 MCG/ACT AERS Inhale 2 puffs into the lungs daily. 02/13/19   Anders Simmonds, PA-C  traMADol (ULTRAM) 50 MG tablet Take 1 tablet (50 mg total) by mouth every 6 (six) hours as needed. 07/09/19    LampteyBritta Mccreedy, MD    Family History Family History  Problem Relation Age of Onset  . Heart disease Mother 52  . Heart failure Mother   . Asthma Mother   . Emphysema Mother   . Alzheimer's disease Father 32  . Emphysema Brother   . Asthma Brother   . Colon cancer Neg Hx     Social History Social History   Tobacco Use  . Smoking status: Current Every Day Smoker    Packs/day: 0.50    Years: 47.00    Pack years: 23.50    Types: Cigarettes  . Smokeless tobacco: Never Used  . Tobacco comment: 5 cigarettes/day 11/28/17  Substance Use Topics  . Alcohol use: No    Alcohol/week: 0.0 standard drinks  . Drug use: No     Allergies   Aspirin, Ciprofloxacin, Codeine, Eggs or egg-derived products, Fentanyl, Hydrocodone, Lactose intolerance (gi), Oxycodone, and Penicillins   Review of Systems Review of Systems  Constitutional: Negative for activity change, fatigue and fever.  Respiratory: Negative for cough, chest tightness and shortness of breath.   Cardiovascular: Negative.   Genitourinary: Positive for frequency. Negative for dysuria, urgency, vaginal bleeding and vaginal discharge.  Musculoskeletal: Positive for back pain. Negative for arthralgias and myalgias.  Skin: Negative for rash and wound.  Neurological: Negative for dizziness, light-headedness and headaches.     Physical Exam Triage Vital Signs ED Triage Vitals  Enc Vitals Group     BP 07/09/19 1749 (!) 154/116     Pulse Rate 07/09/19 1749 69     Resp 07/09/19 1749 16     Temp 07/09/19 1749 98 F (36.7 C)     Temp Source 07/09/19 1749 Oral     SpO2 07/09/19 1749 100 %     Weight --      Height --      Head Circumference --      Peak Flow --      Pain Score 07/09/19 1747 6     Pain Loc --      Pain Edu? --      Excl. in GC? --    No data found.  Updated Vital Signs BP (!) 154/116 (BP Location: Right Arm) Comment: newly switched BP medication  Pulse 69   Temp 98 F (36.7 C) (Oral)   Resp 16    SpO2 100%   Visual Acuity Right Eye Distance:   Left Eye Distance:   Bilateral Distance:    Right Eye Near:   Left Eye Near:    Bilateral Near:     Physical Exam Vitals and nursing note reviewed.  Constitutional:      General: She is not in acute distress.    Appearance: She is not ill-appearing.  Cardiovascular:     Rate and Rhythm: Normal rate and regular rhythm.     Heart sounds: Normal heart  sounds.  Abdominal:     General: Abdomen is protuberant.     Palpations: Abdomen is soft.     Tenderness: There is no abdominal tenderness. There is no right CVA tenderness, left CVA tenderness, guarding or rebound.     Hernia: No hernia is present.     Comments: Anterior abdominal wall hernia in the suprapubic area.  Skin:    General: Skin is warm.     Capillary Refill: Capillary refill takes less than 2 seconds.     Comments: Right leg raise test is positive.  Tenderness over the right paraspinal muscles of the lumbar region.  Deep tendon reflexes 2+ in both knees  Neurological:     Mental Status: She is alert.      UC Treatments / Results  Labs (all labs ordered are listed, but only abnormal results are displayed) Labs Reviewed  URINE CULTURE - Abnormal; Notable for the following components:      Result Value   Culture   (*)    Value: <10,000 COLONIES/mL INSIGNIFICANT GROWTH Performed at [facility], [address] [place], [place] [address]    All other components within normal limits  POCT URINALYSIS DIP (DEVICE) - Abnormal; Notable for the following components:   Hgb urine dipstick TRACE (*)    All other components within normal limits    EKG   Radiology No results found.  Procedures Procedures (including critical care time)  Medications Ordered in UC Medications  dexamethasone (DECADRON) injection 10 mg (10 mg Intramuscular Given 07/09/19 1857)    Initial Impression / Assessment and Plan / UC Course  I have reviewed the triage vital signs and the  nursing notes.  Pertinent labs & imaging results that were available during my care of the patient were reviewed by me and considered in my medical decision making (see chart for details).     1.  Acute right-sided lower back pain with sciatica: Tramadol as needed for pain Ibuprofen as needed for pain Gentle range of motion exercises Warm compress If patient symptoms worsens she is welcome to return to urgent care to be reevaluated   2.  Frequency of micturition Urinalysis is significant for leukocyte Estrace. Urine culture sent No antibiotics will be prescribed at this time If the urine cultures grows bacteria, antibiotics will be prescribed at that time. Final Clinical Impressions(s) / UC Diagnoses   Final diagnoses:  Acute right-sided low back pain with sciatica, sciatica laterality unspecified   Discharge Instructions   None    ED Prescriptions    Medication Sig Dispense Auth. Provider   traMADol (ULTRAM) 50 MG tablet Take 1 tablet (50 mg total) by mouth every 6 (six) hours as needed. 15 tablet Ludivina Guymon, Britta Mccreedy, MD   ibuprofen (ADVIL) 600 MG tablet Take 1 tablet (600 mg total) by mouth every 6 (six) hours as needed. 30 tablet Marceil Welp, Britta Mccreedy, MD   cyclobenzaprine (FLEXERIL) 10 MG tablet Take 1 tablet (10 mg total) by mouth 2 (two) times daily as needed for muscle spasms. 20 tablet Aeneas Longsworth, Britta Mccreedy, MD     I have reviewed the PDMP during this encounter.   Merrilee Jansky, MD 07/11/19 [phone]

## 2019-07-09 NOTE — ED Triage Notes (Signed)
Patient presents to Urgent Care with complaints of right flank pain, hip pain, and lower abdominal pain since the past week. Patient reports she thinks she may have a UTI, has been having urinary frequency.

## 2019-07-09 NOTE — ED Notes (Signed)
Urine in lab 

## 2019-07-10 LAB — URINE CULTURE: Culture: <10000 — AB

## 2019-07-11 ENCOUNTER — Ambulatory Visit: Payer: Medicare Other | Attending: Family Medicine | Admitting: Family Medicine

## 2019-07-11 DIAGNOSIS — F419 Anxiety disorder, unspecified: Secondary | ICD-10-CM | POA: Diagnosis not present

## 2019-07-11 DIAGNOSIS — I10 Essential (primary) hypertension: Secondary | ICD-10-CM

## 2019-07-11 DIAGNOSIS — M5431 Sciatica, right side: Secondary | ICD-10-CM

## 2019-07-11 MED ORDER — METOPROLOL SUCCINATE ER 25 MG PO TB24: 25.0000 mg | ORAL_TABLET | Freq: Every day | ORAL | 6 refills | Status: DC

## 2019-07-11 NOTE — Progress Notes (Signed)
Patient verified DOB Patient has taken medication and has eaten today. Patient complains of back pain and hernia pain at a 5. Patient still complains of foul smell and pain.

## 2019-07-11 NOTE — Progress Notes (Signed)
Virtual Visit via Telephone Note  I connected with Maria Oliver, on 07/11/2019 at 1:36 PM by telephone due to the COVID-19 pandemic and verified that I am speaking with the correct person using two identifiers.   Consent: I discussed the limitations, risks, security and privacy concerns of performing an evaluation and management service by telephone and the availability of in person appointments. I also discussed with the patient that there may be a patient responsible charge related to this service. The patient expressed understanding and agreed to proceed.   Location of Patient: Home  Location of Provider: Clinic   Persons participating in Telemedicine visit: Chelsy Parrales Far[place]-CMA Dr. Margarita Rana     History of Present Illness: Maria Oliver is a 59 year old female with a history of COPD, tobacco abuse, hypertension here for follow-up visit.  She has low back pain in the middle of her back, a strong smell on urinating but no frequency, dysuria or hematuria.  Pain radiates to her right thigh. Treated for Sciatica at the ED and received Toradol shot 2 days ago with improvement in her symptoms.  UA was negative for UTI.  She was also prescribed Flexeril and ibuprofen. Her BP has been running high and she wakes up with a fast heart rate. BP would be 694 systolic. Tachycardia occurs regardless of activity and she states her anxiety is controlled and she declines initiation of medication for anxiety.   Past Medical History:  Diagnosis Date  . Abdominal pain   . Anxiety   . Arthritis   . Asthma   . Chronic cholecystitis with calculus s/p lap cholecystectomy 09/09/2016 09/09/2016  . Complication of anesthesia    slow to arouse post operatively   . COPD (chronic obstructive pulmonary disease) ([place])   . 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 Generalized headaches   . Hernia, inguinal, right   . Hypertension   . Leg swelling     Allergies  Allergen Reactions  . Aspirin Other (See Comments)    REACTION: GI upset/Hernia   . Ciprofloxacin Other (See Comments)    Tendonitis, joint pain, and nausea.  "Interfered with her HCTZ" (also)  . Codeine Nausea And Vomiting and Other (See Comments)    (Also) GI upset/Hernia (CAN TOLERATE DILAUDID & MORPHINE)  . Eggs Or Egg-Derived Products Nausea Only    Stomach pains  . Fentanyl Nausea Only    Tolerates Dilaudid or morphine much better  . Hydrocodone Nausea And Vomiting  . Lactose Intolerance (Gi) Nausea And Vomiting and Other (See Comments)    (Also) vertigo  . Oxycodone Nausea And Vomiting    Tolerates hydromorphone better  . Penicillins Rash    Has patient had a PCN reaction causing immediate rash, facial/tongue/throat swelling, SOB or lightheadedness with hypotension: Yes Has patient had a PCN reaction causing severe rash involving mucus membranes or skin necrosis: No Has patient had a PCN reaction that required hospitalization No Has patient had a PCN reaction occurring within the last 10 years: No If all of the above answers are "NO", then may proceed with Cephalosporin use.     Current Outpatient Medications on File Prior to Visit  Medication Sig Dispense Refill  . albuterol (VENTOLIN HFA) 108 (90 Base) MCG/ACT inhaler Inhale 2 puffs into the lungs every 6 (six) hours as needed for wheezing or shortness of breath. 18 g 3  . amLODipine (NORVASC) 2.5 MG tablet Take 1 tablet (2.5 mg total) by mouth daily. [place]  tablet 1  . cholecalciferol (VITAMIN D) 1000 units tablet Take 1,000 Units by mouth every other day.     . cyclobenzaprine (FLEXERIL) 10 MG tablet Take 1 tablet (10 mg total) by mouth 2 (two) times daily as needed for muscle spasms. 20 tablet 0  . ibuprofen (ADVIL) 600 MG tablet Take 1 tablet (600 mg total) by mouth every 6 (six) hours as needed. 30 tablet 0  . lisinopril (ZESTRIL) 10 MG tablet TAKE 1 TABLET BY MOUTH DAILY 90 tablet 1  . mometasone-formoterol  (DULERA) 200-5 MCG/ACT AERO Inhale 2 puffs into the lungs 2 (two) times daily. 13 g 3  . Multiple Vitamin (MULTIVITAMIN WITH MINERALS) TABS tablet Take 1 tablet by mouth every other day.    . Tiotropium Bromide Monohydrate (SPIRIVA RESPIMAT) 2.5 MCG/ACT AERS Inhale 2 puffs into the lungs daily. 4 g 3  . traMADol (ULTRAM) 50 MG tablet Take 1 tablet (50 mg total) by mouth every 6 (six) hours as needed. 15 tablet 0   No current facility-administered medications on file prior to visit.    Observations/Objective: Awake, alert, oriented x3 Not in acute distress  Assessment and Plan: 1. Anxiety This could be causing her tachycardia She declines initiation of medications  2. Essential hypertension Ambulatory blood pressures are uncontrolled Initiation of metoprolol which will also help with tachycardia - metoprolol succinate (TOPROL XL) 25 MG 24 hr tablet; Take 1 tablet (25 mg total) by mouth daily.  Dispense: 30 tablet; Refill: 6  3. Sciatica of right side Improving She has no urinary symptoms Advised to continue Flexeril and ibuprofen which was prescribed during UC visit.   Follow Up Instructions: Keep previously scheduled appointment   I discussed the assessment and treatment plan with the patient. The patient was provided an opportunity to ask questions and all were answered. The patient agreed with the plan and demonstrated an understanding of the instructions.   The patient was advised to call back or seek an in-person evaluation if the symptoms worsen or if the condition fails to improve as anticipated.     I provided 12 minutes total of non-face-to-face time during this encounter including median intraservice time, reviewing previous notes, investigations, ordering medications, medical decision making, coordinating care and patient verbalized understanding at the end of the visit.     Hoy Register, MD, FAAFP. [facility] [place],  [place] [phone]   07/11/2019, 1:36 PM

## 2019-07-12 ENCOUNTER — Encounter: Payer: Self-pay | Admitting: Family Medicine

## 2019-07-23 ENCOUNTER — Ambulatory Visit (INDEPENDENT_AMBULATORY_CARE_PROVIDER_SITE_OTHER): Payer: Medicare Other | Admitting: Primary Care

## 2019-07-23 ENCOUNTER — Other Ambulatory Visit: Payer: Self-pay

## 2019-07-23 ENCOUNTER — Encounter: Payer: Self-pay | Admitting: Primary Care

## 2019-07-23 DIAGNOSIS — Z20822 Contact with and (suspected) exposure to covid-19: Secondary | ICD-10-CM | POA: Diagnosis not present

## 2019-07-23 NOTE — Progress Notes (Signed)
Reviewed and agree with assessment/plan.   Elbony Mcclimans, MD [facility] 06/09/2016, 12:24 PM Pager:  [phone]  

## 2019-07-23 NOTE — Progress Notes (Signed)
Virtual Visit via Telephone Note  I connected with Maria Oliver on 07/23/19 at  2:00 PM EST by telephone and verified that I am speaking with the correct person using two identifiers.  Location: Patient: Home Provider: Office   I discussed the limitations, risks, security and privacy concerns of performing an evaluation and management service by telephone and the availability of in person appointments. I also discussed with the patient that there may be a patient responsible charge related to this service. The patient expressed understanding and agreed to proceed.   History of Present Illness: 59 year old female, current smoker (23+ pack years). PMH significant for COPD, HTN, anxiety, chronic pain syndrome, tobacco abuse. Former patient of Dr. Kendrick Fries, last seen by pulmonary NP on 09/21/18. Needs to establish with new pulmonary provider. Maintained on Dulera 200 and Spiriva. Continue to encourage smoking cessation.  07/23/2019 Patient contacted today for acute televisit. She developed runny nose, sneezing, HA, dizziness and nausea one week ago. Associated shortness of breath and right sided hip/back pain. She has been consistent with Dulera 200 two puffs twice daily and Spiriva. She is using her albuterol rescue inhaler twice daily. She has been taking tylenol and Flonase daily for acute symptoms.  She was seen by UC for back pain, recommending tramadol/ibuprofen and ROM exercises. Urine culture negative. Denies chest tightness or acute wheezing.    Observations/Objective:   - Able to speak in full sentences - No obvious wheezing or cough  Assessment and Plan:  Acute Sinusitis: - Symptoms consistent with sinusitis or flu like illness - Needs to be tested for covid-19 - Recommending patient continue Flonase nasal spray daily - Add saline irrigation (Netti pot/ocean nasal spray) twice daily and loratadine 10mg  daily - If covid negative consider abx  COPD - No clear symptoms of  exacerbation - Continue Dulera 200 and Spiriva Respimat 2.65mcg daily - Continue albuterol HFA 2 puffs every 4-6 hours as needed for breakthrough sob/wheezing - Needs to establish with new [facility] pulmonary provider - Recommending FU in 3 months   Follow Up Instructions:   - As needed if symptoms do not improve and after covid testing results    I discussed the assessment and treatment plan with the patient. The patient was provided an opportunity to ask questions and all were answered. The patient agreed with the plan and demonstrated an understanding of the instructions.   The patient was advised to call back or seek an in-person evaluation if the symptoms worsen or if the condition fails to improve as anticipated.  I provided 22 minutes of non-face-to-face time during this encounter.   4m, NP

## 2019-07-23 NOTE — Patient Instructions (Addendum)
 -   Continue Flonase nasal spray daily - Add saline irrigation (Netti pot/ocean nasal spray) twice daily and loratadine 10mg  daily - If covid negative consider abx      [phone]  COVID-19 Vaccine Information can be found at: [email or web address] For questions related to vaccine distribution or appointments, please email [email or web address] or call [phone].

## 2019-07-24 ENCOUNTER — Other Ambulatory Visit: Payer: Medicare Other

## 2019-07-24 ENCOUNTER — Ambulatory Visit: Payer: Medicare Other | Attending: Internal Medicine

## 2019-07-24 DIAGNOSIS — Z20822 Contact with and (suspected) exposure to covid-19: Secondary | ICD-10-CM

## 2019-07-25 ENCOUNTER — Telehealth: Payer: Self-pay | Admitting: Primary Care

## 2019-07-25 LAB — NOVEL CORONAVIRUS, NAA: SARS-CoV-2, NAA: NOT DETECTED

## 2019-07-25 MED ORDER — DOXYCYCLINE HYCLATE 100 MG PO TABS: 100.0000 mg | ORAL_TABLET | Freq: Two times a day (BID) | ORAL | 0 refills | Status: DC

## 2019-07-25 NOTE — Telephone Encounter (Signed)
Called and spoke with pt letting her know that Beth sent abx to pharmacy for her. Pt verbalized understanding. Nothing further needed.

## 2019-07-25 NOTE — Telephone Encounter (Signed)
Spoke with pt. She is requesting her COVID test results. Her results are negative, she is aware of this. Pt is asking if Waynetta Sandy will give her antibiotic now that she knows she is COVID negative.  Beth - please advise. Thanks.

## 2019-07-25 NOTE — Telephone Encounter (Signed)
Yes, sending in Doxycycline now

## 2019-08-28 ENCOUNTER — Ambulatory Visit: Payer: Self-pay | Admitting: *Deleted

## 2019-09-04 ENCOUNTER — Ambulatory Visit: Payer: Self-pay | Admitting: *Deleted

## 2019-09-12 ENCOUNTER — Other Ambulatory Visit: Payer: Self-pay | Admitting: *Deleted

## 2019-09-12 NOTE — Patient Outreach (Signed)
[facility] Care Management  [facility] Care Manager  09/12/2019   Maria Oliver March 20, 1961 [id number]  RN Health Coach telephone call to patient.  Hipaa compliance verified. Per patient she is doing good. No breathing problems Patient has not received the COVID vaccine. Patient was afraid to get the COVID vaccine because of allergies. RN discussed with patient about talking to her pulmonologist about er taking the vaccine. [name]. Per patient she is using her nebulizer as per ordered. Per patient she is able to afford food and medications at this time. Patient has agreed to follow up outreach calls.     Encounter Medications:  Outpatient Encounter Medications as of 09/12/2019  Medication Sig  . albuterol (VENTOLIN HFA) 108 (90 Base) MCG/ACT inhaler Inhale 2 puffs into the lungs every 6 (six) hours as needed for wheezing or shortness of breath.  Marland Kitchen amLODipine (NORVASC) 2.5 MG tablet Take 1 tablet (2.5 mg total) by mouth daily.  . cholecalciferol (VITAMIN D) 1000 units tablet Take 1,000 Units by mouth every other day.   . cyclobenzaprine (FLEXERIL) 10 MG tablet Take 1 tablet (10 mg total) by mouth 2 (two) times daily as needed for muscle spasms.  Marland Kitchen doxycycline (VIBRA-TABS) 100 MG tablet Take 1 tablet (100 mg total) by mouth 2 (two) times daily.  Marland Kitchen ibuprofen (ADVIL) 600 MG tablet Take 1 tablet (600 mg total) by mouth every 6 (six) hours as needed.  Marland Kitchen lisinopril (ZESTRIL) 10 MG tablet TAKE 1 TABLET BY MOUTH DAILY  . metoprolol succinate (TOPROL XL) 25 MG 24 hr tablet Take 1 tablet (25 mg total) by mouth daily.  . mometasone-formoterol (DULERA) 200-5 MCG/ACT AERO Inhale 2 puffs into the lungs 2 (two) times daily.  . Multiple Vitamin (MULTIVITAMIN WITH MINERALS) TABS tablet Take 1 tablet by mouth every other day.  . Tiotropium Bromide Monohydrate (SPIRIVA RESPIMAT) 2.5 MCG/ACT AERS Inhale 2 puffs into the lungs daily.  . traMADol (ULTRAM) 50 MG tablet  Take 1 tablet (50 mg total) by mouth every 6 (six) hours as needed.   No facility-administered encounter medications on file as of 09/12/2019.    Functional Status:  In your present state of health, do you have any difficulty performing the following activities: 09/12/2019 04/09/2019  Hearing? N N  Vision? Y N  Comment Patient still needs to schedule eye exam due to needing new glasses. Patient has delayed due to COVID -  Difficulty concentrating or making decisions? N N  Walking or climbing stairs? Y N  Comment shortness of breath with moderate exertion -  Dressing or bathing? N N  Doing errands, shopping? Y N  Preparing Food and eating ? N -  Using the Toilet? N -  In the past six months, have you accidently leaked urine? N -  Do you have problems with loss of bowel control? N -  Managing your Medications? N -  Managing your Finances? N -  Housekeeping or managing your Housekeeping? N -  Some recent data might be hidden    Fall/Depression Screening: Fall Risk  09/12/2019 05/29/2019 04/09/2019  Falls in the past year? 0 0 0  Number falls in past yr: 0 0 -  Injury with Fall? 0 0 -  Follow up Falls evaluation completed Falls evaluation completed Falls evaluation completed   PHQ 2/9 Scores 05/29/2019 04/09/2019 03/16/2019 02/13/2019 12/11/2018 07/19/2018 02/06/2018  PHQ - 2 Score 2 2 0 0 4 4 4   PHQ- 9 Score 3 3 - -  8 8 12    THN CM Care Plan Problem One     Most Recent Value  Care Plan Problem One  Knowledge Deficit in Self Management of COPD  Role Documenting the Problem One  Health Coach  Care Plan for Problem One  Active  THN Long Term Goal   Patient with not have any readmission within the next 90 days for COPD exacerbation  Interventions for Problem One Long Term Goal  RN will continue to reinfore the zones and action plan especially with the COVID pandemic still in effect. RN will follow up for further discussion and support  THN CM Short Term Goal #1   patient will follow up with  health maintenance care within the next 30 days  Interventions for Short Term Goal #1  RN discussed the importance of Health Maintenance. RN discussed the COVID vaccine and following up with her pulmonologist. RN discussed following up on eye exam. RN will follow up with further discussion.       Assessment:  Patient has not received the COVID vaccine Patient has not scheduled her eye exam Patient has not followed up with Dentist Patient is taking medications as prescribed  Plan:  Patient will talk with Pulmonologist regarding her receiving the COVID vaccine Patient will schedule eye exam Patient will follow up with her dentist  RN discussed medication adherence RN discussed COPD exacerbation and zones RN sent education on COVID vaccine RN sent 2021 Calendar book  [place] Management [phone]  RN will follow up within the month of June

## 2019-10-16 ENCOUNTER — Other Ambulatory Visit: Payer: Self-pay | Admitting: Physician Assistant

## 2019-10-16 DIAGNOSIS — J439 Emphysema, unspecified: Secondary | ICD-10-CM

## 2019-11-07 ENCOUNTER — Other Ambulatory Visit: Payer: Self-pay | Admitting: *Deleted

## 2019-11-07 NOTE — Patient Outreach (Signed)
[facility]) Care Management  [facility] Care Manager  11/07/2019   MACAIAH MANGAL 1960/09/03 [id number]  RN Health Coach telephone call to patient.  Hipaa compliance verified. Per patient she is doing pretty good. Patient stated that she does have some difficulty breathing when the humidity is elevated. Patient is using her nebulizer more frequently with the humidity. Per patient she has not received her COVID vaccine. Patient is taking her medications as prescribed. Per patient she is able to afford her medications. Patient has agreed to follow up outreach calls.     Encounter Medications:  Outpatient Encounter Medications as of 11/07/2019  Medication Sig  . albuterol (VENTOLIN HFA) 108 (90 Base) MCG/ACT inhaler INHALE 2 PUFFS INTO THE LUNGS EVERY 6 HOURS AS NEEDED FOR WHEEZING OR SHORTNESS OF BREATH  . amLODipine (NORVASC) 2.5 MG tablet Take 1 tablet (2.5 mg total) by mouth daily.  . cholecalciferol (VITAMIN D) 1000 units tablet Take 1,000 Units by mouth every other day.   . cyclobenzaprine (FLEXERIL) 10 MG tablet Take 1 tablet (10 mg total) by mouth 2 (two) times daily as needed for muscle spasms.  Marland Kitchen doxycycline (VIBRA-TABS) 100 MG tablet Take 1 tablet (100 mg total) by mouth 2 (two) times daily.  Marland Kitchen ibuprofen (ADVIL) 600 MG tablet Take 1 tablet (600 mg total) by mouth every 6 (six) hours as needed.  Marland Kitchen lisinopril (ZESTRIL) 10 MG tablet TAKE 1 TABLET BY MOUTH DAILY  . metoprolol succinate (TOPROL XL) 25 MG 24 hr tablet Take 1 tablet (25 mg total) by mouth daily.  . mometasone-formoterol (DULERA) 200-5 MCG/ACT AERO Inhale 2 puffs into the lungs 2 (two) times daily.  . Multiple Vitamin (MULTIVITAMIN WITH MINERALS) TABS tablet Take 1 tablet by mouth every other day.  . Tiotropium Bromide Monohydrate (SPIRIVA RESPIMAT) 2.5 MCG/ACT AERS Inhale 2 puffs into the lungs daily.  . traMADol (ULTRAM) 50 MG tablet Take 1 tablet (50 mg total) by mouth every 6 (six) hours as needed.   No  facility-administered encounter medications on file as of 11/07/2019.    Functional Status:  In your present state of health, do you have any difficulty performing the following activities: 09/12/2019 04/09/2019  Hearing? N N  Vision? Y N  Comment Patient still needs to schedule eye exam due to needing new glasses. Patient has delayed due to COVID -  Difficulty concentrating or making decisions? N N  Walking or climbing stairs? Y N  Comment shortness of breath with moderate exertion -  Dressing or bathing? N N  Doing errands, shopping? Y N  Preparing Food and eating ? N -  Using the Toilet? N -  In the past six months, have you accidently leaked urine? N -  Do you have problems with loss of bowel control? N -  Managing your Medications? N -  Managing your Finances? N -  Housekeeping or managing your Housekeeping? N -  Some recent data might be hidden    Fall/Depression Screening: Fall Risk  09/12/2019 05/29/2019 04/09/2019  Falls in the past year? 0 0 0  Number falls in past yr: 0 0 -  Injury with Fall? 0 0 -  Follow up Falls evaluation completed Falls evaluation completed Falls evaluation completed   PHQ 2/9 Scores 05/29/2019 04/09/2019 03/16/2019 02/13/2019 12/11/2018 07/19/2018 02/06/2018  PHQ - 2 Score 2 2 0 0 4 4 4   PHQ- 9 Score 3 3 - - 8 8 12    THN CM Care Plan Problem One  Most Recent Value  Care Plan Problem One  Knowledge Deficit in Self Management of COPD  Role Documenting the Problem One  Health Coach  Care Plan for Problem One  Active  THN Long Term Goal   Patient with not have any readmission within the next 90 days for COPD exacerbation  Interventions for Problem One Long Term Goal  RN reinforces the zones and action Will continue to reinforce each outreach  [facility] CM Short Term Goal #1   patient will follow up with health maintenance care within the next 30 days  Interventions for Short Term Goal #1  RN reiterates health maintenance. Patient has not received her COVID  vaccine. Patient has scheduled eye exam. Patient has went for 1st dental visit . RN will follow up with vaccines and health care.      Assessment:   Patient has not received her COVID vaccine [name] Patient is using her nebulizer more with the humidity Patient has followed up for dental care Patient  Has eye exam scheduled Patient is able to afford medications Patient is using pandemic precautions Patient understand the COPD zones and action plan Plan:  Eye exam scheduled for November 26, 2019 RN sent information on [facility] dental clinics RN discussed COVID vaccine RN offered to assist patient in scheduling when ready RN will follow up outreach call within the month of August  [facility] [phone]

## 2019-11-27 ENCOUNTER — Telehealth: Payer: Self-pay | Admitting: Primary Care

## 2019-11-27 MED ORDER — AZITHROMYCIN 250 MG PO TABS: ORAL_TABLET | ORAL | 0 refills | Status: DC

## 2019-11-27 MED ORDER — PREDNISONE 10 MG PO TABS: ORAL_TABLET | ORAL | 0 refills | Status: DC

## 2019-11-27 NOTE — Telephone Encounter (Signed)
Spoke with patient and advised her of Beth's recommendations.  Attempted to schedule her with a new provider in July, however, schedules were not available and she is having cataract surgery on 12/12/19 and 12/18/19 and would prefer to wait to be seen until later.  Put patient in for a recall to see Dr. Celine Mans in September.  Pt. Verbalized understanding.

## 2019-11-27 NOTE — Telephone Encounter (Signed)
-   I will send in Zpack and prednisone for COPD exacerbation.  - Continue Dulera 200 two puffs twice daily and Spiriva Respimat 2.69mcg two puffs in the morning   - Continue albuterol HFA 2 puffs every 4-6 hours as needed for breakthrough sob/wheezing  - Make sure she is set up to establish with new pulmonary doctor, she is an old patient of Dr. Kendrick Fries

## 2019-11-30 ENCOUNTER — Ambulatory Visit
Admission: RE | Admit: 2019-11-30 | Discharge: 2019-11-30 | Disposition: A | Discharge status: Home / Self Care | Payer: Medicare Other | Source: Ambulatory Visit | Attending: Acute Care | Admitting: Acute Care

## 2019-11-30 DIAGNOSIS — F17210 Nicotine dependence, cigarettes, uncomplicated: Secondary | ICD-10-CM

## 2019-12-06 ENCOUNTER — Other Ambulatory Visit: Payer: Self-pay | Admitting: *Deleted

## 2019-12-06 DIAGNOSIS — F17210 Nicotine dependence, cigarettes, uncomplicated: Secondary | ICD-10-CM

## 2019-12-06 DIAGNOSIS — Z87891 Personal history of nicotine dependence: Secondary | ICD-10-CM

## 2019-12-06 NOTE — Progress Notes (Signed)
I have called the patient and let them  know their  low dose Ct was read as a Lung  RADS 3, nodules that are probably benign findings, short term follow up suggested: includes nodules with a low likelihood of becoming a clinically active cancer. Radiology recommends a 6 month repeat LDCT follow up.  .I let them  know we will order and schedule their follow up screening scan for 05/2020. I let them  know there was notation of CAD on their  scan., and reminded the patient  that this is a non-gated exam therefore degree or severity of disease  cannot be determined. Please have them  follow up with their PCP regarding potential risk factor modification, dietary therapy or pharmacologic therapy if clinically indicated. Pt.  is not  currently on statin therapy. Please place order for 6 month follow up screening scan for  05/2020 and fax results to PCP. Thanks so much.  Angelique Blonder, please do not schedule this patient at [facility] [address]. She said the front office staff were unprofessional, and she does not want to return there, so hopefully the scanner will be moved to [place] in 6 months , or we can use the [address] [facility] location. Thanks so much

## 2020-01-09 ENCOUNTER — Encounter (HOSPITAL_COMMUNITY): Payer: Self-pay

## 2020-01-09 ENCOUNTER — Emergency Department (HOSPITAL_COMMUNITY)
Admission: EM | Admit: 2020-01-09 | Discharge: 2020-01-10 | Disposition: A | Discharge status: Home / Self Care | Payer: Medicare Other | Attending: Emergency Medicine | Admitting: Emergency Medicine

## 2020-01-09 ENCOUNTER — Other Ambulatory Visit: Payer: Self-pay

## 2020-01-09 DIAGNOSIS — Z7951 Long term (current) use of inhaled steroids: Secondary | ICD-10-CM | POA: Insufficient documentation

## 2020-01-09 DIAGNOSIS — I10 Essential (primary) hypertension: Secondary | ICD-10-CM | POA: Insufficient documentation

## 2020-01-09 DIAGNOSIS — Z20822 Contact with and (suspected) exposure to covid-19: Secondary | ICD-10-CM | POA: Insufficient documentation

## 2020-01-09 DIAGNOSIS — K469 Unspecified abdominal hernia without obstruction or gangrene: Secondary | ICD-10-CM | POA: Diagnosis not present

## 2020-01-09 DIAGNOSIS — B349 Viral infection, unspecified: Secondary | ICD-10-CM

## 2020-01-09 DIAGNOSIS — R5381 Other malaise: Secondary | ICD-10-CM

## 2020-01-09 DIAGNOSIS — R109 Unspecified abdominal pain: Secondary | ICD-10-CM | POA: Insufficient documentation

## 2020-01-09 DIAGNOSIS — Z79899 Other long term (current) drug therapy: Secondary | ICD-10-CM | POA: Diagnosis not present

## 2020-01-09 DIAGNOSIS — Z8249 Family history of ischemic heart disease and other diseases of the circulatory system: Secondary | ICD-10-CM | POA: Diagnosis not present

## 2020-01-09 DIAGNOSIS — J449 Chronic obstructive pulmonary disease, unspecified: Secondary | ICD-10-CM | POA: Insufficient documentation

## 2020-01-09 DIAGNOSIS — F17210 Nicotine dependence, cigarettes, uncomplicated: Secondary | ICD-10-CM | POA: Insufficient documentation

## 2020-01-09 DIAGNOSIS — R531 Weakness: Secondary | ICD-10-CM | POA: Diagnosis present

## 2020-01-09 LAB — CBC
HCT: 46.7 % — ABNORMAL HIGH (ref 36.0–46.0)
Hemoglobin: 14.8 g/dL (ref 12.0–15.0)
MCH: 28.2 pg (ref 26.0–34.0)
MCHC: 31.7 g/dL (ref 30.0–36.0)
MCV: 89.1 fL (ref 80.0–100.0)
Platelets: 302 10*3/uL (ref 150–400)
RBC: 5.24 MIL/uL — ABNORMAL HIGH (ref 3.87–5.11)
RDW: 13.9 % (ref 11.5–15.5)
WBC: 9.4 10*3/uL (ref 4.0–10.5)
nRBC: 0 % (ref 0.0–0.2)

## 2020-01-09 LAB — URINALYSIS, ROUTINE W REFLEX MICROSCOPIC
Bilirubin Urine: NEGATIVE
Glucose, UA: NEGATIVE mg/dL
Hgb urine dipstick: NEGATIVE
Ketones, ur: NEGATIVE mg/dL
Leukocytes,Ua: NEGATIVE
Nitrite: NEGATIVE
Protein, ur: NEGATIVE mg/dL
Specific Gravity, Urine: 1.023 (ref 1.005–1.030)
pH: 5 (ref 5.0–8.0)

## 2020-01-09 LAB — COMPREHENSIVE METABOLIC PANEL
ALT: 19 U/L (ref 0–44)
AST: 20 U/L (ref 15–41)
Albumin: 4.1 g/dL (ref 3.5–5.0)
Alkaline Phosphatase: 73 U/L (ref 38–126)
Anion gap: 10 (ref 5–15)
BUN: 10 mg/dL (ref 8–23)
CO2: 26 mmol/L (ref 22–32)
Calcium: 9.1 mg/dL (ref 8.9–10.3)
Chloride: 104 mmol/L (ref 98–111)
Creatinine, Ser: 0.81 mg/dL (ref 0.44–1.00)
GFR calc Af Amer: >60 mL/min (ref >60)
GFR calc non Af Amer: >60 mL/min (ref >60)
Glucose, Bld: 148 mg/dL — ABNORMAL HIGH (ref 70–99)
Potassium: 4 mmol/L (ref 3.5–5.1)
Sodium: 140 mmol/L (ref 135–145)
Total Bilirubin: 0.3 mg/dL (ref 0.3–1.2)
Total Protein: 6.8 g/dL (ref 6.5–8.1)

## 2020-01-09 LAB — LIPASE, BLOOD: Lipase: 21 U/L (ref 11–51)

## 2020-01-09 NOTE — ED Triage Notes (Signed)
Pt reports generalized weakness and body aches over the past week. Denies chest pain, SOB. Intermittent dizziness.

## 2020-01-10 ENCOUNTER — Emergency Department (HOSPITAL_COMMUNITY): Payer: Medicare Other

## 2020-01-10 DIAGNOSIS — B349 Viral infection, unspecified: Secondary | ICD-10-CM | POA: Diagnosis not present

## 2020-01-10 LAB — SARS CORONAVIRUS 2 BY RT PCR (HOSPITAL ORDER, PERFORMED IN ~~LOC~~ HOSPITAL LAB): SARS Coronavirus 2: NEGATIVE

## 2020-01-10 MED ORDER — HYDROMORPHONE HCL 2 MG PO TABS
2.0000 mg | ORAL_TABLET | Freq: Once | ORAL | Status: AC
  Administered 2020-01-10: 2 mg via ORAL
  Filled 2020-01-10: qty 1

## 2020-01-10 NOTE — ED Provider Notes (Signed)
[facility] EMERGENCY DEPARTMENT Provider Note   CSN: [id number] Arrival date & time: 01/09/20  2237     History Chief Complaint  Patient presents with   Weakness   Generalized Body Aches    Maria Oliver is a 59 y.o. female.  Presents to ER with concern for generalized weakness, body aches.  Symptoms ongoing for the past week, initially noted just generalized fatigue and malaise, then started having some body aches.  Over the past couple days has also noted some dull intermittent mild to moderate left-sided abdominal pain.  No alleviating or aggravating factors, has not taken any medication for this pain.  No associated constipation, diarrhea, nausea or vomiting.  Patient also reports occasional mild headache, not sudden onset, not worst headache of her life, currently not having much of a headache at all.  No fevers, no sick contacts, reports chronic cough but no changes in her cough.  Past medical history noted for COPD, hypertension, past surgical history noted for lap chole, colostomy, patient reports known history of abdominal hernia.  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 09/09/2016   Generalized headaches    Hernia, inguinal, right    Hypertension    Leg swelling     Patient Active Problem List   Diagnosis Date Noted   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/07/2017   Pruritic rash 10/14/2016   Chronic narcotic use 09/09/2016   Onychomycosis of right great toe 10/13/2015   Osteoarthritis 08/11/2015   Chronic abdominal pain 05/22/2015   Chronic pain syndrome 05/22/2015   Vitamin D  insufficiency 02/06/2015   Chronic knee pain 02/06/2015   Ventral hernia without obstruction or gangrene 12/31/2014   Anxiety 12/31/2014   COPD (chronic obstructive pulmonary disease) with emphysema (HCC)    Tobacco abuse 10/01/2009   Essential hypertension 10/01/2009    Past Surgical History:  Procedure Laterality Date   COLON SURGERY  2010   per patient "colon take down"   COLOSTOMY  2010   LAPAROSCOPIC CHOLECYSTECTOMY SINGLE SITE WITH INTRAOPERATIVE CHOLANGIOGRAM N/A 09/09/2016   Procedure: LAPAROSCOPIC CHOLECYSTECTOMY  WITH INTRAOPERATIVE CHOLANGIOGRAM;  Surgeon: Karie Soda, MD;  Location: [facility] ORS;  Service: General;  Laterality: N/A;   TUBAL LIGATION       OB History   No obstetric history on file.     Family History  Problem Relation Age of Onset   Heart disease Mother 3   Heart failure Mother    Asthma Mother    Emphysema Mother    Alzheimer's disease Father 57   Emphysema Brother    Asthma Brother    Colon cancer Neg Hx     Social History   Tobacco Use   Smoking status: Current Every Day Smoker    Packs/day: 0.50    Years: 47.00    Pack years: 23.50    Types: Cigarettes   Smokeless tobacco: Never Used   Tobacco comment: 5 cigarettes/day 11/28/17  Vaping Use   Vaping Use: Never used  Substance Use Topics   Alcohol use: No    Alcohol/week: 0.0 standard drinks   Drug use: No  Home Medications Prior to Admission medications   Medication Sig Start Date End Date Taking? Authorizing Provider  acetaminophen (TYLENOL) 325 MG tablet Take 650 mg by mouth every 6 (six) hours as needed for mild pain or moderate pain.   Yes [provider]  albuterol (VENTOLIN HFA) 108 (90 Base) MCG/ACT inhaler INHALE 2 PUFFS INTO THE LUNGS EVERY 6 HOURS AS NEEDED FOR WHEEZING OR SHORTNESS OF BREATH Patient taking differently: Inhale 2 puffs into the lungs every 6 (six) hours as needed for wheezing or shortness of breath.  10/18/19  Yes Newlin,  Odette Horns, MD  lisinopril (ZESTRIL) 10 MG tablet TAKE 1 TABLET BY MOUTH DAILY Patient taking differently: Take 10 mg by mouth daily.  05/30/19  Yes Hoy Register, MD  Multiple Vitamin (MULTIVITAMIN WITH MINERALS) TABS tablet Take 1 tablet by mouth every other day.   Yes [provider]  amLODipine (NORVASC) 2.5 MG tablet Take 1 tablet (2.5 mg total) by mouth daily. Patient not taking: Reported on 01/10/2020 02/28/19   Hoy Register, MD  azithromycin [facility]) 250 MG tablet Zpack taper as directed Patient not taking: Reported on 01/10/2020 11/27/19   Glenford Bayley, NP  cyclobenzaprine (FLEXERIL) 10 MG tablet Take 1 tablet (10 mg total) by mouth 2 (two) times daily as needed for muscle spasms. Patient not taking: Reported on 01/10/2020 07/09/19   Merrilee Jansky, MD  doxycycline (VIBRA-TABS) 100 MG tablet Take 1 tablet (100 mg total) by mouth 2 (two) times daily. Patient not taking: Reported on 01/10/2020 07/25/19   Glenford Bayley, NP  ibuprofen (ADVIL) 600 MG tablet Take 1 tablet (600 mg total) by mouth every 6 (six) hours as needed. Patient not taking: Reported on 01/10/2020 07/09/19   Merrilee Jansky, MD  metoprolol succinate (TOPROL XL) 25 MG 24 hr tablet Take 1 tablet (25 mg total) by mouth daily. Patient not taking: Reported on 01/10/2020 07/11/19   Hoy Register, MD  mometasone-formoterol (DULERA) 200-5 MCG/ACT AERO Inhale 2 puffs into the lungs 2 (two) times daily. Patient not taking: Reported on 01/10/2020 02/13/19   Anders Simmonds, PA-C  predniSONE (DELTASONE) 10 MG tablet Take 4 tabs po daily x 2 days; then 3 tabs for 2 days; then 2 tabs for 2 days; then 1 tab for 2 days Patient not taking: Reported on 01/10/2020 11/27/19   Glenford Bayley, NP  Tiotropium Bromide Monohydrate (SPIRIVA RESPIMAT) 2.5 MCG/ACT AERS Inhale 2 puffs into the lungs daily. Patient not taking: Reported on 01/10/2020 02/13/19   Anders Simmonds, PA-C  traMADol (ULTRAM) 50 MG tablet Take 1 tablet (50  mg total) by mouth every 6 (six) hours as needed. Patient not taking: Reported on 01/10/2020 07/09/19   Merrilee Jansky, MD    Allergies    Aspirin, Ciprofloxacin, Codeine, Eggs or egg-derived products, Fentanyl, Hydrocodone, Lactose intolerance (gi), Oxycodone, and Penicillins  Review of Systems   Review of Systems  Constitutional: Positive for chills and fatigue. Negative for fever.  HENT: Negative for ear pain and sore throat.   Eyes: Negative for pain and visual disturbance.  Respiratory: Negative for cough and shortness of breath.   Cardiovascular: Negative for chest pain and palpitations.  Gastrointestinal: Positive for abdominal pain. Negative for vomiting.  Genitourinary: Negative for difficulty urinating, dysuria and hematuria.  Musculoskeletal: Positive for arthralgias and myalgias. Negative for back pain.  Skin: Negative for color change and rash.  Neurological: Negative for seizures and syncope.  All other systems reviewed and are negative.  Physical Exam Updated Vital Signs BP (!) 152/108    Pulse 67    Temp 97.9 F (36.6 C) (Oral)    Resp 18    Ht 5\' 4"  (1.626 m)    Wt 67.6 kg    SpO2 97%    BMI 25.58 kg/m   Physical Exam Vitals and nursing note reviewed.  Constitutional:      General: She is not in acute distress.    Appearance: She is well-developed.  HENT:     Head: Normocephalic and atraumatic.  Eyes:     Conjunctiva/sclera: Conjunctivae normal.  Cardiovascular:     Rate and Rhythm: Normal rate and regular rhythm.     Heart sounds: No murmur heard.   Pulmonary:     Effort: Pulmonary effort is normal. No respiratory distress.     Breath sounds: Normal breath sounds.  Abdominal:     Palpations: Abdomen is soft.     Tenderness: There is no abdominal tenderness.     Comments: Mild TTP in LUQ, LLQ, no rebound or guarding  Musculoskeletal:        General: No deformity or signs of injury.     Cervical back: Neck supple.  Skin:    General: Skin is warm  and dry.     Capillary Refill: Capillary refill takes less than 2 seconds.  Neurological:     General: No focal deficit present.     Mental Status: She is alert and oriented to person, place, and time.     ED Results / Procedures / Treatments   Labs (all labs ordered are listed, but only abnormal results are displayed) Labs Reviewed  CBC - Abnormal; Notable for the following components:      Result Value   RBC 5.24 (*)    HCT 46.7 (*)    All other components within normal limits  COMPREHENSIVE METABOLIC PANEL - Abnormal; Notable for the following components:   Glucose, Bld 148 (*)    All other components within normal limits  SARS CORONAVIRUS 2 BY RT PCR (HOSPITAL ORDER, PERFORMED IN [facility])  URINALYSIS, ROUTINE W REFLEX MICROSCOPIC  LIPASE, BLOOD    EKG EKG Interpretation  Date/Time:  Wednesday January 09 2020 22:55:47 EDT Ventricular Rate:  71 PR Interval:  114 QRS Duration: 60 QT Interval:  394 QTC Calculation: 428 R Axis:   77 Text Interpretation: Normal sinus rhythm Low voltage QRS Cannot rule out Anterior infarct , age undetermined Abnormal ECG When compared with ECG of 03/18/2016, No significant change was found Confirmed by 05/18/2016 (Dione Booze) on 01/10/2020 1:55:19 AM   Radiology CT ABDOMEN PELVIS WO CONTRAST  Result Date: 01/10/2020 CLINICAL DATA:  Abdominal pain with concern for diverticulitis. EXAM: CT ABDOMEN AND PELVIS WITHOUT CONTRAST TECHNIQUE: Multidetector CT imaging of the abdomen and pelvis was performed following the standard protocol without IV contrast. COMPARISON:  11/22/2017 FINDINGS: Lower chest:  Minimal dependent atelectasis. Hepatobiliary: No focal liver abnormality.Cholecystectomy. No bile duct dilatation. Pancreas: Mild generalized after Spleen: Unremarkable. Adrenals/Urinary Tract: Negative adrenals. No hydronephrosis or ureteral stone. Left hilar calcification that is likely vascular based on reformats. Small cystic density at the  interpolar left kidney. Unremarkable bladder. Stomach/Bowel: No obstruction. No evidence of bowel inflammation. Rare colonic diverticula. Vascular/Lymphatic: No acute vascular abnormality. Scattered atherosclerotic calcifications of the aorta. No mass or adenopathy. Reproductive:Unremarkable for age. Other: No ascites or pneumoperitoneum. Large low midline hernia, right eccentric, containing loops of small bowel and colon. The hernia neck is wide and  there is no related bowel obstruction. Musculoskeletal: No acute abnormalities. IMPRESSION: 1. No acute finding.  Negative for diverticulitis. 2. Large but uncomplicated low abdominal hernia which contains multiple loops of bowel. Electronically Signed   By: Marnee SpringJonathon  Watts M.D.   On: 01/10/2020 09:42    Procedures Procedures (including critical care time)  Medications Ordered in ED Medications  HYDROmorphone (DILAUDID) tablet 2 mg (2 mg Oral Given 01/10/20 0749)    ED Course  I have reviewed the triage vital signs and the nursing notes.  Pertinent labs & imaging results that were available during my care of the patient were reviewed by me and considered in my medical decision making (see chart for details).    MDM Rules/Calculators/A&P                          59 year old lady presenting to ER with concern for generalized fatigue, body aches, abdominal discomfort.  On exam patient is noted to be well-appearing, no distress, stable vital signs.  Lab work was grossly normal.  Given her reported abdominal pain, obtain CT abdomen pelvis which was without any acute findings, she has large but uncomplicated low abdominal hernia, which patient reports known history of.  Covid negative.  Given constellation of symptoms, suspect most likely acute viral illness.  I recommend patient to follow-up with her primary doctor, I reviewed return precautions in detail with patient, discharged home.    After the discussed management above, the patient was determined  to be safe for discharge.  The patient was in agreement with this plan and all questions regarding their care were answered.  ED return precautions were discussed and the patient will return to the ED with any significant worsening of condition.    Final Clinical Impression(s) / ED Diagnoses Final diagnoses:  Acute viral syndrome  Malaise    Rx / DC Orders ED Discharge Orders    None       Milagros Lollykstra, Dakari Cregger S, MD 01/10/20 1022

## 2020-01-10 NOTE — Discharge Instructions (Signed)
Please schedule follow-up appointment with your primary doctor for close recheck early next week.  Please return to ER if you develop worsening abdominal pain, vomiting, fever or other new concerning symptom.  Recommend Tylenol Motrin as needed for pain control.

## 2020-01-15 ENCOUNTER — Telehealth: Payer: Self-pay | Admitting: Internal Medicine

## 2020-01-15 NOTE — Telephone Encounter (Signed)
Sorry to hear that she is not feeling well.  Patient has been recommended for follow-up and did not keep this.  She was recently called in antibiotics and steroids.  She was also seen in the emergency room.  Patient needs an in person visit for further evaluation and possible treatment options. Please see if there is any availability for new physicians to establish as she is a former Dr. Kendrick Fries patient Last office visit July 23, 2019. Recommend continuing with recent emergency room visit recommendations if not improving will need an office visit and or return to urgent care or emergency room Recommend to continue on Spiriva and Dulera.  Use albuterol as needed Mucinex as needed Patient is on ACE inhibitor this could be contributing to ongoing or recurrent cough Also can reach out to her primary care provider for office visit  Please contact office for sooner follow up if symptoms do not improve or worsen or seek emergency care

## 2020-01-15 NOTE — Telephone Encounter (Signed)
Called ans spoke with patient she states that Saturday she started having Dizzy spells, stuffy nose and headache. States she was at [place] [employer] and is now back in [place]. During that time she used Flonase and it seemed to help some. She came back to [facility] on Monday and states that she feels like it has now moved from her head into her chest and is asking for a good antibiotic to knock it out.   Pharmacy is [facility]  Tammy please advise

## 2020-01-15 NOTE — Telephone Encounter (Signed)
First avail 30-min appt for pt to see MD is not until 9/13.  Called and spoke with pt stating to her the info from TP and she verbalized understanding. Pt wanted to have an appt scheduled with APP first due to how far out it would be before she would be able to see a new pulmonologist so I have scheduled pt an appt with TP 8/12 at 4:30 as that was the first avail appt with an APP.  I have also scheduled pt an appt with Dr. Judeth Horn 9/13. Stated to her if she became worse prior to her appt with TP 8/12 to either call PCP, go to UC, or go to ED and she verbalized understanding.  Nothing further needed.

## 2020-01-16 ENCOUNTER — Encounter: Payer: Self-pay | Admitting: Family Medicine

## 2020-01-16 ENCOUNTER — Ambulatory Visit: Payer: Medicare Other | Attending: Family Medicine | Admitting: Family Medicine

## 2020-01-16 ENCOUNTER — Other Ambulatory Visit: Payer: Self-pay

## 2020-01-16 VITALS — BP 168/96 | HR 75 | Ht 64.0 in | Wt 149.6 lb

## 2020-01-16 DIAGNOSIS — J0140 Acute pansinusitis, unspecified: Secondary | ICD-10-CM | POA: Diagnosis not present

## 2020-01-16 DIAGNOSIS — I10 Essential (primary) hypertension: Secondary | ICD-10-CM | POA: Diagnosis not present

## 2020-01-16 DIAGNOSIS — J441 Chronic obstructive pulmonary disease with (acute) exacerbation: Secondary | ICD-10-CM

## 2020-01-16 MED ORDER — PREDNISONE 20 MG PO TABS: 20.0000 mg | ORAL_TABLET | Freq: Every day | ORAL | 0 refills | Status: DC

## 2020-01-16 MED ORDER — AMLODIPINE BESYLATE 2.5 MG PO TABS: 2.5000 mg | ORAL_TABLET | Freq: Every day | ORAL | 1 refills | Status: DC

## 2020-01-16 MED ORDER — DOXYCYCLINE HYCLATE 100 MG PO TABS: 100.0000 mg | ORAL_TABLET | Freq: Two times a day (BID) | ORAL | 0 refills | Status: DC

## 2020-01-16 MED ORDER — METOPROLOL SUCCINATE ER 25 MG PO TB24: 25.0000 mg | ORAL_TABLET | Freq: Every day | ORAL | 6 refills | Status: DC

## 2020-01-16 MED ORDER — LISINOPRIL 10 MG PO TABS: 10.0000 mg | ORAL_TABLET | Freq: Every day | ORAL | 1 refills | Status: DC

## 2020-01-16 MED ORDER — PROMETHAZINE HCL 25 MG PO TABS: 25.0000 mg | ORAL_TABLET | Freq: Three times a day (TID) | ORAL | 0 refills | Status: DC | PRN

## 2020-01-16 NOTE — Progress Notes (Signed)
Subjective:  Patient ID: Maria Oliver, female    DOB: [birth year]  Age: 59 y.o. MRN: [id number]  CC: Hypertension   HPI Maria Oliver is a 59 year old female with a history of COPD, tobacco abuse, hypertension here for an acute visit. She complains of a 1 week history of headaches behind her eyes, coughing and spitting up whitish phlegm, dizziness, nausea, fatigue and dyspnea.  [place] but states she was not exposed to a crowd as she was with her son who has a condo.  She has had no fever or diarrhea. Seen at urgent care on 01/09/2020 and diagnosed with acute viral syndrome.  Covid test was negative. She called her pulmonologist office but could not get an appointment until next week. Flonase has provided some relief. Also complains her blood pressure has been through the roof.  On further inquiry she has only been taking lisinopril but not taking her amlodipine or metoprolol.  Past Medical History:  Diagnosis Date  . Abdominal pain   . Anxiety   . Arthritis   . Asthma   . Chronic cholecystitis with calculus s/p lap cholecystectomy 09/09/2016 09/09/2016  . Complication of anesthesia    slow to arouse post operatively   . COPD (chronic obstructive pulmonary disease) (HCC)   . 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 Generalized headaches   . Hernia, inguinal, right   . Hypertension   . Leg swelling     Past Surgical History:  Procedure Laterality Date  . COLON SURGERY  2010   per patient "colon take down"  . COLOSTOMY  2010  . LAPAROSCOPIC CHOLECYSTECTOMY SINGLE SITE WITH INTRAOPERATIVE CHOLANGIOGRAM N/A 09/09/2016   Procedure: LAPAROSCOPIC CHOLECYSTECTOMY  WITH INTRAOPERATIVE CHOLANGIOGRAM;  Surgeon: Karie Soda, MD;  Location: [facility];  Service: General;  Laterality: N/A;  . TUBAL LIGATION      Family History  Problem Relation Age of Onset  . Heart disease Mother 11  . Heart failure Mother   . Asthma Mother     . Emphysema Mother   . Alzheimer's disease Father 42  . Emphysema Brother   . Asthma Brother   . Colon cancer Neg Hx     Allergies  Allergen Reactions  . Aspirin Other (See Comments)    REACTION: GI upset/Hernia   . Ciprofloxacin Other (See Comments)    Tendonitis, joint pain, and nausea.  "Interfered with her HCTZ" (also)  . Codeine Nausea And Vomiting and Other (See Comments)    (Also) GI upset/Hernia (CAN TOLERATE DILAUDID & MORPHINE)  . Eggs Or Egg-Derived Products Nausea Only    Stomach pains  . Fentanyl Nausea Only    Tolerates Dilaudid or morphine much better  . Hydrocodone Nausea And Vomiting  . Lactose Intolerance (Gi) Nausea And Vomiting and Other (See Comments)    (Also) vertigo  . Oxycodone Nausea And Vomiting    Tolerates hydromorphone better  . Penicillins Rash    Has patient had a PCN reaction causing immediate rash, facial/tongue/throat swelling, SOB or lightheadedness with hypotension: Yes Has patient had a PCN reaction causing severe rash involving mucus membranes or skin necrosis: No Has patient had a PCN reaction that required hospitalization No Has patient had a PCN reaction occurring within the last 10 years: No If all of the above answers are "NO", then may proceed with Cephalosporin use.     Outpatient Medications Prior to Visit  Medication Sig Dispense Refill  . albuterol (  VENTOLIN HFA) 108 (90 Base) MCG/ACT inhaler INHALE 2 PUFFS INTO THE LUNGS EVERY 6 HOURS AS NEEDED FOR WHEEZING OR SHORTNESS OF BREATH (Patient taking differently: Inhale 2 puffs into the lungs every 6 (six) hours as needed for wheezing or shortness of breath. ) 25.5 g 0  . Multiple Vitamin (MULTIVITAMIN WITH MINERALS) TABS tablet Take 1 tablet by mouth every other day.    . Tiotropium Bromide Monohydrate (SPIRIVA RESPIMAT) 2.5 MCG/ACT AERS Inhale 2 puffs into the lungs daily. 4 g 3  . lisinopril (ZESTRIL) 10 MG tablet TAKE 1 TABLET BY MOUTH DAILY (Patient taking differently: Take 10  mg by mouth daily. ) 90 tablet 1  . acetaminophen (TYLENOL) 325 MG tablet Take 650 mg by mouth every 6 (six) hours as needed for mild pain or moderate pain. (Patient not taking: Reported on 01/16/2020)    . azithromycin (ZITHROMAX) 250 MG tablet Zpack taper as directed (Patient not taking: Reported on 01/10/2020) 6 tablet 0  . cyclobenzaprine (FLEXERIL) 10 MG tablet Take 1 tablet (10 mg total) by mouth 2 (two) times daily as needed for muscle spasms. (Patient not taking: Reported on 01/10/2020) 20 tablet 0  . ibuprofen (ADVIL) 600 MG tablet Take 1 tablet (600 mg total) by mouth every 6 (six) hours as needed. (Patient not taking: Reported on 01/10/2020) 30 tablet 0  . mometasone-formoterol (DULERA) 200-5 MCG/ACT AERO Inhale 2 puffs into the lungs 2 (two) times daily. (Patient not taking: Reported on 01/10/2020) 13 g 3  . traMADol (ULTRAM) 50 MG tablet Take 1 tablet (50 mg total) by mouth every 6 (six) hours as needed. (Patient not taking: Reported on 01/10/2020) 15 tablet 0  . amLODipine (NORVASC) 2.5 MG tablet Take 1 tablet (2.5 mg total) by mouth daily. (Patient not taking: Reported on 01/10/2020) 90 tablet 1  . doxycycline (VIBRA-TABS) 100 MG tablet Take 1 tablet (100 mg total) by mouth 2 (two) times daily. (Patient not taking: Reported on 01/10/2020) 14 tablet 0  . metoprolol succinate (TOPROL XL) 25 MG 24 hr tablet Take 1 tablet (25 mg total) by mouth daily. (Patient not taking: Reported on 01/10/2020) 30 tablet 6  . predniSONE (DELTASONE) 10 MG tablet Take 4 tabs po daily x 2 days; then 3 tabs for 2 days; then 2 tabs for 2 days; then 1 tab for 2 days (Patient not taking: Reported on 01/10/2020) 20 tablet 0   No facility-administered medications prior to visit.     ROS Review of Systems  Constitutional: Positive for fatigue. Negative for activity change and appetite change.  HENT: Positive for postnasal drip, sinus pressure and sinus pain. Negative for congestion and sore throat.   Eyes: Negative for  visual disturbance.  Respiratory: Positive for cough and shortness of breath. Negative for chest tightness and wheezing.   Cardiovascular: Negative for chest pain and palpitations.  Gastrointestinal: Negative for abdominal distention, abdominal pain and constipation.  Endocrine: Negative for polydipsia.  Genitourinary: Negative for dysuria and frequency.  Musculoskeletal: Negative for arthralgias and back pain.  Skin: Negative for rash.  Neurological: Positive for headaches. Negative for tremors, light-headedness and numbness.  Hematological: Does not bruise/bleed easily.  Psychiatric/Behavioral: Negative for agitation and behavioral problems.    Objective:  BP (!) 168/96   Pulse 75   Ht 5\' 4"  (1.626 m)   Wt 149 lb 9.6 oz (67.9 kg)   SpO2 95%   BMI 25.68 kg/m   BP/Weight 01/16/2020 01/10/2020 01/09/2020  Systolic BP 168 140 -  Diastolic BP 96  93 -  Wt. (Lbs) 149.6 - 149.03  BMI 25.68 - 25.58      Physical Exam Constitutional:      Appearance: She is well-developed.  HENT:     Head:     Comments: Sinus tenderness to percussion of frontal, maxillary and ethmoidal sinuses    Right Ear: Tympanic membrane normal.     Left Ear: Tympanic membrane normal.     Mouth/Throat:     Mouth: Mucous membranes are moist.  Neck:     Vascular: No JVD.  Cardiovascular:     Rate and Rhythm: Normal rate.     Heart sounds: Normal heart sounds. No murmur heard.   Pulmonary:     Effort: Pulmonary effort is normal.     Breath sounds: Examination of the right-upper field reveals rales. Examination of the right-middle field reveals rales. Examination of the right-lower field reveals rales. Rales present. No wheezing.  Chest:     Chest wall: No tenderness.  Abdominal:     General: Bowel sounds are normal. There is no distension.     Palpations: Abdomen is soft. There is no mass.     Tenderness: There is no abdominal tenderness.     Hernia: A hernia is present.  Musculoskeletal:         General: Normal range of motion.     Right lower leg: No edema.     Left lower leg: No edema.  Neurological:     Mental Status: She is alert and oriented to person, place, and time.  Psychiatric:        Mood and Affect: Mood normal.     CMP Latest Ref Rng & Units 01/09/2020 02/28/2019 11/22/2017  Glucose 70 - 99 mg/dL 431(V) 77 80  BUN 8 - 23 mg/dL 10 11 5(L)  Creatinine 0.44 - 1.00 mg/dL 4.00 8.67 6.19  Sodium 135 - 145 mmol/L 140 143 142  Potassium 3.5 - 5.1 mmol/L 4.0 4.6 3.6  Chloride 98 - 111 mmol/L 104 103 107  CO2 22 - 32 mmol/L 26 28 28   Calcium 8.9 - 10.3 mg/dL 9.1 9.8 9.1  Total Protein 6.5 - 8.1 g/dL 6.8 6.7 6.3(L)  Total Bilirubin 0.3 - 1.2 mg/dL 0.3 0.4  Alkaline Phos 38 - 126 U/L 73 89 69  AST 15 - 41 U/L 20 15 25   ALT 0 - 44 U/L 19 14 24     Lipid Panel     Component Value Date/Time   CHOL 184 06/28/2017 1157   TRIG 112 06/28/2017 1157   HDL 52 06/28/2017 1157   CHOLHDL 3.5 06/28/2017 1157   CHOLHDL 4.7 05/09/2013 1445   VLDL 32 05/09/2013 1445   LDLCALC 110 (H) 06/28/2017 1157    CBC    Component Value Date/Time   WBC 9.4 01/09/2020 2253   RBC 5.24 (H) 01/09/2020 2253   HGB 14.8 01/09/2020 2253   HGB 14.3 11/02/2017 1621   HCT 46.7 (H) 01/09/2020 2253   HCT 44.0 11/02/2017 1621   PLT 302 01/09/2020 2253   PLT 375 11/02/2017 1621   MCV 89.1 01/09/2020 2253   MCV 87 11/02/2017 1621   MCH 28.2 01/09/2020 2253   MCHC 31.7 01/09/2020 2253   RDW 13.9 01/09/2020 2253   RDW 14.6 11/02/2017 1621   LYMPHSABS 2.5 11/02/2017 1621   MONOABS 0.6 09/02/2016 1955   EOSABS 0.3 11/02/2017 1621   BASOSABS 0.0 11/02/2017 1621    Lab Results  Component Value Date   HGBA1C 5.6  08/11/2015    Assessment & Plan:  1. Essential hypertension Uncontrolled due to not taking all her antihypertensives as prescribed Advised to resume metoprolol and amlodipine in addition to lisinopril Counseled on blood pressure goal of less than 130/80, low-sodium, DASH diet,  medication compliance, 150 minutes of moderate intensity exercise per week. Discussed medication compliance, adverse effects. - metoprolol succinate (TOPROL XL) 25 MG 24 hr tablet; Take 1 tablet (25 mg total) by mouth daily.  Dispense: 30 tablet; Refill: 6 - amLODipine (NORVASC) 2.5 MG tablet; Take 1 tablet (2.5 mg total) by mouth daily.  Dispense: 90 tablet; Refill: 1 - lisinopril (ZESTRIL) 10 MG tablet; Take 1 tablet (10 mg total) by mouth daily.  Dispense: 90 tablet; Refill: 1  2. COPD exacerbation (HCC) We will treat with prednisone Chest x-ray ordered to exclude pneumonia given abnormal lung exam findings and possible progression of sinusitis. - predniSONE (DELTASONE) 20 MG tablet; Take 1 tablet (20 mg total) by mouth daily with breakfast.  Dispense: 5 tablet; Refill: 0 - DG Chest 2 View; Future  3. Acute non-recurrent pansinusitis Penicillin allergic hence will place on doxycycline We do not have SARS-CoV-2 test kits in the clinic Advised that she would need to obtain this from a testing side. - doxycycline (VIBRA-TABS) 100 MG tablet; Take 1 tablet (100 mg total) by mouth 2 (two) times daily.  Dispense: 14 tablet; Refill: 0 - promethazine (PHENERGAN) 25 MG tablet; Take 1 tablet (25 mg total) by mouth every 8 (eight) hours as needed for nausea or vomiting.  Dispense: 20 tablet; Refill: 0 .  She would also like to discuss her anxiety but given this is an acute visit and given her current upper respiratory symptoms, visit was conducted with Covid protocol in place .I have advised her to call the clinic back for an appointment once she feels better. Meds ordered this encounter  Medications  . metoprolol succinate (TOPROL XL) 25 MG 24 hr tablet    Sig: Take 1 tablet (25 mg total) by mouth daily.    Dispense:  30 tablet    Refill:  6  . doxycycline (VIBRA-TABS) 100 MG tablet    Sig: Take 1 tablet (100 mg total) by mouth 2 (two) times daily.    Dispense:  14 tablet    Refill:  0  .  predniSONE (DELTASONE) 20 MG tablet    Sig: Take 1 tablet (20 mg total) by mouth daily with breakfast.    Dispense:  5 tablet    Refill:  0  . promethazine (PHENERGAN) 25 MG tablet    Sig: Take 1 tablet (25 mg total) by mouth every 8 (eight) hours as needed for nausea or vomiting.    Dispense:  20 tablet    Refill:  0  . amLODipine (NORVASC) 2.5 MG tablet    Sig: Take 1 tablet (2.5 mg total) by mouth daily.    Dispense:  90 tablet    Refill:  1  . lisinopril (ZESTRIL) 10 MG tablet    Sig: Take 1 tablet (10 mg total) by mouth daily.    Dispense:  90 tablet    Refill:  1    **Patient requests 90 days supply**    Follow-up: Return in about 1 month (around 02/16/2020), or if symptoms worsen or fail to improve, for follow up of Hypertension and anxiety.       Hoy Register, MD, FAAFP. [facility] [place], [place] [phone]   01/16/2020, 5:02 PM

## 2020-01-16 NOTE — Patient Instructions (Signed)
Sinusitis, Adult Sinusitis is inflammation of your sinuses. Sinuses are hollow spaces in the bones around your face. Your sinuses are located:  Around your eyes.  In the middle of your forehead.  Behind your nose.  In your cheekbones. Mucus normally drains out of your sinuses. When your nasal tissues become inflamed or swollen, mucus can become trapped or blocked. This allows bacteria, viruses, and fungi to grow, which leads to infection. Most infections of the sinuses are caused by a virus. Sinusitis can develop quickly. It can last for up to 4 weeks (acute) or for more than 12 weeks (chronic). Sinusitis often develops after a cold. What are the causes? This condition is caused by anything that creates swelling in the sinuses or stops mucus from draining. This includes:  Allergies.  Asthma.  Infection from bacteria or viruses.  Deformities or blockages in your nose or sinuses.  Abnormal growths in the nose (nasal polyps).  Pollutants, such as chemicals or irritants in the air.  Infection from fungi (rare). What increases the risk? You are more likely to develop this condition if you:  Have a weak body defense system (immune system).  Do a lot of swimming or diving.  Overuse nasal sprays.  Smoke. What are the signs or symptoms? The main symptoms of this condition are pain and a feeling of pressure around the affected sinuses. Other symptoms include:  Stuffy nose or congestion.  Thick drainage from your nose.  Swelling and warmth over the affected sinuses.  Headache.  Upper toothache.  A cough that may get worse at night.  Extra mucus that collects in the throat or the back of the nose (postnasal drip).  Decreased sense of smell and taste.  Fatigue.  A fever.  Sore throat.  Bad breath. How is this diagnosed? This condition is diagnosed based on:  Your symptoms.  Your medical history.  A physical exam.  Tests to find out if your condition is  acute or chronic. This may include: ? Checking your nose for nasal polyps. ? Viewing your sinuses using a device that has a light (endoscope). ? Testing for allergies or bacteria. ? Imaging tests, such as an MRI or CT scan. In rare cases, a bone biopsy may be done to rule out more serious types of fungal sinus disease. How is this treated? Treatment for sinusitis depends on the cause and whether your condition is chronic or acute.  If caused by a virus, your symptoms should go away on their own within 10 days. You may be given medicines to relieve symptoms. They include: ? Medicines that shrink swollen nasal passages (topical intranasal decongestants). ? Medicines that treat allergies (antihistamines). ? A spray that eases inflammation of the nostrils (topical intranasal corticosteroids). ? Rinses that help get rid of thick mucus in your nose (nasal saline washes).  If caused by bacteria, your health care provider may recommend waiting to see if your symptoms improve. Most bacterial infections will get better without antibiotic medicine. You may be given antibiotics if you have: ? A severe infection. ? A weak immune system.  If caused by narrow nasal passages or nasal polyps, you may need to have surgery. Follow these instructions at home: Medicines  Take, use, or apply over-the-counter and prescription medicines only as told by your health care provider. These may include nasal sprays.  If you were prescribed an antibiotic medicine, take it as told by your health care provider. Do not stop taking the antibiotic even if you start   to feel better. Hydrate and humidify   Drink enough fluid to keep your urine pale yellow. Staying hydrated will help to thin your mucus.  Use a cool mist humidifier to keep the humidity level in your home above 50%.  Inhale steam for 10-15 minutes, 3-4 times a day, or as told by your health care provider. You can do this in the bathroom while a hot shower is  running.  Limit your exposure to cool or dry air. Rest  Rest as much as possible.  Sleep with your head raised (elevated).  Make sure you get enough sleep each night. General instructions   Apply a warm, moist washcloth to your face 3-4 times a day or as told by your health care provider. This will help with discomfort.  Wash your hands often with soap and water to reduce your exposure to germs. If soap and water are not available, use hand sanitizer.  Do not smoke. Avoid being around people who are smoking (secondhand smoke).  Keep all follow-up visits as told by your health care provider. This is important. Contact a health care provider if:  You have a fever.  Your symptoms get worse.  Your symptoms do not improve within 10 days. Get help right away if:  You have a severe headache.  You have persistent vomiting.  You have severe pain or swelling around your face or eyes.  You have vision problems.  You develop confusion.  Your neck is stiff.  You have trouble breathing. Summary  Sinusitis is soreness and inflammation of your sinuses. Sinuses are hollow spaces in the bones around your face.  This condition is caused by nasal tissues that become inflamed or swollen. The swelling traps or blocks the flow of mucus. This allows bacteria, viruses, and fungi to grow, which leads to infection.  If you were prescribed an antibiotic medicine, take it as told by your health care provider. Do not stop taking the antibiotic even if you start to feel better.  Keep all follow-up visits as told by your health care provider. This is important. This information is not intended to replace advice given to you by your health care provider. Make sure you discuss any questions you have with your health care provider. Document Revised: 10/31/2017 Document Reviewed: 10/31/2017 Elsevier Patient Education  2020 [employer]  

## 2020-01-16 NOTE — Progress Notes (Signed)
Having chest congestion, runny nose and dizziness.

## 2020-01-24 ENCOUNTER — Other Ambulatory Visit: Payer: Self-pay

## 2020-01-24 ENCOUNTER — Encounter: Payer: Self-pay | Admitting: Adult Health

## 2020-01-24 ENCOUNTER — Ambulatory Visit (INDEPENDENT_AMBULATORY_CARE_PROVIDER_SITE_OTHER): Payer: Medicare Other | Admitting: Adult Health

## 2020-01-24 DIAGNOSIS — J432 Centrilobular emphysema: Secondary | ICD-10-CM

## 2020-01-24 DIAGNOSIS — Z720 Tobacco use: Secondary | ICD-10-CM

## 2020-01-24 MED ORDER — ANORO ELLIPTA 62.5-25 MCG/INH IN AEPB: 1.0000 | INHALATION_SPRAY | Freq: Every day | RESPIRATORY_TRACT | 5 refills | Status: DC

## 2020-01-24 NOTE — Patient Instructions (Addendum)
Change Spiriva to ANORO 1 puff daily  Discuss with Primary MD that Lisinopril may be aggravating your cough .  Work on not smoking  Mucinex DM Twice daily  As needed  Cough/congestion  Add PFT  Recommend Covid vaccine  Follow up with Dr. Judeth Horn as planned and As needed   Please contact office for sooner follow up if symptoms do not improve or worsen or seek emergency care

## 2020-01-24 NOTE — Progress Notes (Signed)
@Patient  ID: , female    DOB: September 03, 1960, 59 y.o.   MRN: [id number]  Chief Complaint  Patient presents with  . Follow-up    COPD     Referring provider: [id number], MD  HPI: 59 yo female active smoker followed for COPD   TEST/EVENTS :  PFT's 07/2014: FEV1 1.63 (63%), ratio 66, 19% increase with BD, +airtrapping, normal TLC, DLCO 54% LDCT Chest 11/2019 - Lung-RADS 3, probably benign findings. New 5.5 mm basilar right lower lobe pulmonary nodule  01/24/2020 Follow up : COPD  Patient returns for 6 month follow up . Followed for moderate COPD , continues to smoke . Prone to recurrent flares, has been on steroids and antibiotics x 3 .  Treated by PCP last week with Doxycyline and prednisone feeling better.  Stays active at home, gets winded easily with activity .  On Lisinopril . Has daily cough .  On spiriva does not take on regular basis . Does not like it.  Smoking cessation discussed  Has not gotten the covid vaccine .     Allergies  Allergen Reactions  . Aspirin Other (See Comments)    REACTION: GI upset/Hernia   . Ciprofloxacin Other (See Comments)    Tendonitis, joint pain, and nausea.  "Interfered with her HCTZ" (also)  . Codeine Nausea And Vomiting and Other (See Comments)    (Also) GI upset/Hernia (CAN TOLERATE DILAUDID & MORPHINE)  . Eggs Or Egg-Derived Products Nausea Only    Stomach pains  . Fentanyl Nausea Only    Tolerates Dilaudid or morphine much better  . Hydrocodone Nausea And Vomiting  . Lactose Intolerance (Gi) Nausea And Vomiting and Other (See Comments)    (Also) vertigo  . Oxycodone Nausea And Vomiting    Tolerates hydromorphone better  . Penicillins Rash    Has patient had a PCN reaction causing immediate rash, facial/tongue/throat swelling, SOB or lightheadedness with hypotension: Yes Has patient had a PCN reaction causing severe rash involving mucus membranes or skin necrosis: No Has patient had a PCN reaction that required  hospitalization No Has patient had a PCN reaction occurring within the last 10 years: No If all of the above answers are "NO", then may proceed with Cephalosporin use.     Immunization History  Administered Date(s) Administered  . Tdap 10/07/2013    Past Medical History:  Diagnosis Date  . Abdominal pain   . Anxiety   . Arthritis   . Asthma   . Chronic cholecystitis with calculus s/p lap cholecystectomy 09/09/2016 09/09/2016  . Complication of anesthesia    slow to arouse post operatively   . COPD (chronic obstructive pulmonary disease) (HCC)   . 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 Generalized headaches   . Hernia, inguinal, right   . Hypertension   . Leg swelling     Tobacco History: Social History   Tobacco Use  Smoking Status Current Every Day Smoker  . Packs/day: 0.50  . Years: 47.00  . Pack years: 23.50  . Types: Cigarettes  Smokeless Tobacco Never Used   Ready to quit: No Counseling given: Yes   Outpatient Medications Prior to Visit  Medication Sig Dispense Refill  . albuterol (VENTOLIN HFA) 108 (90 Base) MCG/ACT inhaler INHALE 2 PUFFS INTO THE LUNGS EVERY 6 HOURS AS NEEDED FOR WHEEZING OR SHORTNESS OF BREATH (Patient taking differently: Inhale 2 puffs into the lungs every 6 (six) hours as needed for wheezing or shortness of breath. )  25.5 g 0  . amLODipine (NORVASC) 2.5 MG tablet Take 1 tablet (2.5 mg total) by mouth daily. 90 tablet 1  . lisinopril (ZESTRIL) 10 MG tablet Take 1 tablet (10 mg total) by mouth daily. 90 tablet 1  . metoprolol succinate (TOPROL XL) 25 MG 24 hr tablet Take 1 tablet (25 mg total) by mouth daily. 30 tablet 6  . Multiple Vitamin (MULTIVITAMIN WITH MINERALS) TABS tablet Take 1 tablet by mouth every other day.    . promethazine (PHENERGAN) 25 MG tablet Take 1 tablet (25 mg total) by mouth every 8 (eight) hours as needed for nausea or vomiting. 20 tablet 0  . Tiotropium Bromide Monohydrate (SPIRIVA  RESPIMAT) 2.5 MCG/ACT AERS Inhale 2 puffs into the lungs daily. 4 g 3  . acetaminophen (TYLENOL) 325 MG tablet Take 650 mg by mouth every 6 (six) hours as needed for mild pain or moderate pain. (Patient not taking: Reported on 01/16/2020)    . azithromycin (ZITHROMAX) 250 MG tablet Zpack taper as directed (Patient not taking: Reported on 01/10/2020) 6 tablet 0  . cyclobenzaprine (FLEXERIL) 10 MG tablet Take 1 tablet (10 mg total) by mouth 2 (two) times daily as needed for muscle spasms. (Patient not taking: Reported on 01/10/2020) 20 tablet 0  . doxycycline (VIBRA-TABS) 100 MG tablet Take 1 tablet (100 mg total) by mouth 2 (two) times daily. 14 tablet 0  . ibuprofen (ADVIL) 600 MG tablet Take 1 tablet (600 mg total) by mouth every 6 (six) hours as needed. (Patient not taking: Reported on 01/10/2020) 30 tablet 0  . mometasone-formoterol (DULERA) 200-5 MCG/ACT AERO Inhale 2 puffs into the lungs 2 (two) times daily. (Patient not taking: Reported on 01/10/2020) 13 g 3  . predniSONE (DELTASONE) 20 MG tablet Take 1 tablet (20 mg total) by mouth daily with breakfast. 5 tablet 0  . traMADol (ULTRAM) 50 MG tablet Take 1 tablet (50 mg total) by mouth every 6 (six) hours as needed. (Patient not taking: Reported on 01/10/2020) 15 tablet 0   No facility-administered medications prior to visit.     Review of Systems:   Constitutional:   No  weight loss, night sweats,  Fevers, chills,  +fatigue, or  lassitude.  HEENT:   No headaches,  Difficulty swallowing,  Tooth/dental problems, or  Sore throat,                No sneezing, itching, ear ache, nasal congestion, post nasal drip,   CV:  No chest pain,  Orthopnea, PND, swelling in lower extremities, anasarca, dizziness, palpitations, syncope.   GI  No heartburn, indigestion, abdominal pain, nausea, vomiting, diarrhea, change in bowel habits, loss of appetite, bloody stools.   Resp:  .  No chest wall deformity  Skin: no rash or lesions.  GU: no dysuria, change  in color of urine, no urgency or frequency.  No flank pain, no hematuria   MS:  No joint pain or swelling.  No decreased range of motion.  No back pain.    Physical Exam  BP (!) 148/98 (BP Location: Left Arm, Cuff Size: Normal)   Pulse 86   Ht 5\' 4"  (1.626 m)   Wt 151 lb (68.5 kg)   SpO2 98%   BMI 25.92 kg/m   GEN: A/Ox3; pleasant , NAD, well nourished    HEENT:  [place]/AT,  EACs-clear, TMs-wnl, NOSE-clear, THROAT-clear, no lesions, no postnasal drip or exudate noted.   NECK:  Supple w/ fair ROM; no JVD; normal carotid impulses w/o  bruits; no thyromegaly or nodules palpated; no lymphadenopathy.    RESP  Clear  P & A; w/o, wheezes/ rales/ or rhonchi. no accessory muscle use, no dullness to percussion  CARD:  RRR, no m/r/g, no peripheral edema, pulses intact, no cyanosis or clubbing.  GI:   Soft & nt; nml bowel sounds; no organomegaly or masses detected.   Musco: Warm bil, no deformities or joint swelling noted.   Neuro: alert, no focal deficits noted.    Skin: Warm, no lesions or rashes    Lab Results:  CBC  BNP No results found for: BNP  ProBNP No results found for: PROBNP     PFT Results Latest Ref Rng & Units 08/02/2014  FVC-Pre L 2.47  FVC-Predicted Pre % 75  FVC-Post L 2.76  FVC-Predicted Post % 83  Pre FEV1/FVC % % 66  Post FEV1/FCV % % 71  FEV1-Pre L 1.63  FEV1-Predicted Pre % 63  FEV1-Post L 1.95  DLCO uncorrected ml/min/mmHg 12.42  DLCO UNC% % 54  DLCO corrected ml/min/mmHg 12.50  DLCO COR %Predicted % 54  DLVA Predicted % 62  TLC L 5.72  TLC % Predicted % 116  RV % Predicted % 180    No results found for: NITRICOXIDE      Assessment & Plan:   COPD (chronic obstructive pulmonary disease) with emphysema Prone to recurrent flares, not compliant with spiriva, still smoking , on ACE inhibitor , needs PFT   Plan  Patient Instructions  Change Spiriva to ANORO 1 puff daily  Discuss with Primary MD that Lisinopril may be aggravating your  cough .  Work on not smoking  Mucinex DM Twice daily  As needed  Cough/congestion  Add PFT  Recommend Covid vaccine  Follow up with Dr. Judeth Horn as planned and As needed   Please contact office for sooner follow up if symptoms do not improve or worsen or seek emergency care       Tobacco abuse Smoking cessation      Maria Deroy, NP 01/24/2020

## 2020-01-24 NOTE — Assessment & Plan Note (Signed)
Smoking cessation  

## 2020-01-24 NOTE — Assessment & Plan Note (Addendum)
Prone to recurrent flares, not compliant with spiriva, still smoking , on ACE inhibitor , needs PFT   Plan  Patient Instructions  Change Spiriva to ANORO 1 puff daily  Discuss with Primary MD that Lisinopril may be aggravating your cough .  Work on not smoking  Mucinex DM Twice daily  As needed  Cough/congestion  Add PFT  Recommend Covid vaccine  Follow up with Dr. Judeth Horn as planned and As needed   Please contact office for sooner follow up if symptoms do not improve or worsen or seek emergency care

## 2020-02-07 ENCOUNTER — Ambulatory Visit: Payer: Self-pay | Admitting: *Deleted

## 2020-02-15 ENCOUNTER — Other Ambulatory Visit: Payer: Self-pay | Admitting: *Deleted

## 2020-02-15 NOTE — Patient Outreach (Signed)
[facility]) Care Management  02/15/2020  ANQUANETTE BAHNER 28-Apr-1961 [id number]  RN Health Coach attempted follow up outreach call to patient.  Patient was unavailable. HIPPA compliance voicemail message left with return callback number.  Plan: RN will call patient again within 30 days.  Gean Maidens BSN RN [facility] Care Management [phone]

## 2020-02-25 ENCOUNTER — Other Ambulatory Visit: Payer: Self-pay

## 2020-02-25 ENCOUNTER — Ambulatory Visit (INDEPENDENT_AMBULATORY_CARE_PROVIDER_SITE_OTHER): Payer: Medicare Other | Admitting: Pulmonary Disease

## 2020-02-25 ENCOUNTER — Encounter: Payer: Self-pay | Admitting: Pulmonary Disease

## 2020-02-25 VITALS — BP 138/84 | HR 83 | Temp 97.7°F | Ht 64.0 in | Wt 152.0 lb

## 2020-02-25 DIAGNOSIS — J432 Centrilobular emphysema: Secondary | ICD-10-CM | POA: Diagnosis not present

## 2020-02-25 MED ORDER — ADVAIR HFA 115-21 MCG/ACT IN AERO: 2.0000 | INHALATION_SPRAY | Freq: Two times a day (BID) | RESPIRATORY_TRACT | 12 refills | Status: DC

## 2020-02-25 NOTE — Progress Notes (Signed)
Patient ID: Maria Oliver, female    DOB: 08/04/1960, 59 y.o.   MRN: [id number]  Chief Complaint  Patient presents with  . Follow-up    Breathing is unchanged since the last visit. She is using her rescue inhaler 2-3 x per wk. Cough is unchanged.     Referring provider: Hoy Register, MD  HPI:   Maria Oliver is a 59 y.o. woman with mild obstruction and significant bronchodilator response on PFTs, active smoker who has exacerbations of COPD ~1 time a year. 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  02/25/2020  - Visit  She has been doing pretty well. Breathing at baseline. Does not use maintenance inhaler at this time. Has not had prednisone in some months. Says gets a prescription form PCP or urgent care about once a year on average. Breathing generally worse in summer she thinks due to humidity outside. She takes things slow and does fine. Rests up before big outings. Manages her DOE well. Used albuterol ~3 times a week. Rarely at night. Has a cough productive of sputum, daily. No recent changes in terms of frequency or quality of sputum production.    Questionaires / Pulmonary Flowsheets:   ACT:  No flowsheet data found.  MMRC: mMRC Dyspnea Scale mMRC Score  02/25/2020 1    Epworth:  No flowsheet data found.  Tests:   FENO:  No results found for: NITRICOXIDE  PFT: PFT Results Latest Ref Rng & Units 08/02/2014  FVC-Pre L 2.47  FVC-Predicted Pre % 75  FVC-Post L 2.76  FVC-Predicted Post % 83  Pre FEV1/FVC % % 66  Post FEV1/FCV % % 71  FEV1-Pre L 1.63  FEV1-Predicted Pre % 63  FEV1-Post L 1.95  DLCO uncorrected ml/min/mmHg 12.42  DLCO UNC% % 54  DLCO corrected ml/min/mmHg 12.50  DLCO COR %Predicted % 54  DLVA Predicted % 62  TLC L 5.72  TLC % Predicted % 116  RV % Predicted % 180    WALK:  No flowsheet data found.  Imaging: CT 11/2019 reviewed, interpreted as: emphysema. Sub 14mm pulmonary nodule RLL  Lab Results: Reviewed, Eos up to 300-400 CBC     Component Value Date/Time   WBC 9.4 01/09/2020 2253   RBC 5.24 (H) 01/09/2020 2253   HGB 14.8 01/09/2020 2253   HGB 14.3 11/02/2017 1621   HCT 46.7 (H) 01/09/2020 2253   HCT 44.0 11/02/2017 1621   PLT 302 01/09/2020 2253   PLT 375 11/02/2017 1621   MCV 89.1 01/09/2020 2253   MCV 87 11/02/2017 1621   MCH 28.2 01/09/2020 2253   MCHC 31.7 01/09/2020 2253   RDW 13.9 01/09/2020 2253   RDW 14.6 11/02/2017 1621   LYMPHSABS 2.5 11/02/2017 1621   MONOABS 0.6 09/02/2016 1955   EOSABS 0.3 11/02/2017 1621   BASOSABS 0.0 11/02/2017 1621    BMET    Component Value Date/Time   NA 140 01/09/2020 2253   NA 143 02/28/2019 1616   K 4.0 01/09/2020 2253   CL 104 01/09/2020 2253   CO2 26 01/09/2020 2253   GLUCOSE 148 (H) 01/09/2020 2253   BUN 10 01/09/2020 2253   BUN 11 02/28/2019 1616   CREATININE 0.81 01/09/2020 2253   CREATININE 0.67 08/11/2015 1054   CALCIUM 9.1 01/09/2020 2253   GFRNONAA >60 01/09/2020 2253   GFRNONAA >89 08/11/2015 1054   GFRAA >60 01/09/2020 2253   GFRAA >89 08/11/2015 1054    BNP No results found for: BNP  ProBNP No results found  for: PROBNP  Specialty Problems      Pulmonary Problems   COPD (chronic obstructive pulmonary disease) with emphysema (HCC)    CXR 06/2014: hyperinflation, prominent BV markings, no acute process PFT's 07/2014:  FEV1 1.63 (63%), ratio 66, 19% increase with BD, +airtrapping, normal TLC, DLCO 54%         Allergies  Allergen Reactions  . Aspirin Other (See Comments)    REACTION: GI upset/Hernia   . Ciprofloxacin Other (See Comments)    Tendonitis, joint pain, and nausea.  "Interfered with her HCTZ" (also)  . Codeine Nausea And Vomiting and Other (See Comments)    (Also) GI upset/Hernia (CAN TOLERATE DILAUDID & MORPHINE)  . Eggs Or Egg-Derived Products Nausea Only    Stomach pains  . Fentanyl Nausea Only    Tolerates Dilaudid or morphine much better  . Hydrocodone Nausea And Vomiting  . Lactose Intolerance (Gi) Nausea And  Vomiting and Other (See Comments)    (Also) vertigo  . Oxycodone Nausea And Vomiting    Tolerates hydromorphone better  . Penicillins Rash    Has patient had a PCN reaction causing immediate rash, facial/tongue/throat swelling, SOB or lightheadedness with hypotension: Yes Has patient had a PCN reaction causing severe rash involving mucus membranes or skin necrosis: No Has patient had a PCN reaction that required hospitalization No Has patient had a PCN reaction occurring within the last 10 years: No If all of the above answers are "NO", then may proceed with Cephalosporin use.     Immunization History  Administered Date(s) Administered  . Tdap 10/07/2013    Past Medical History:  Diagnosis Date  . Abdominal pain   . Anxiety   . Arthritis   . Asthma   . Chronic cholecystitis with calculus s/p lap cholecystectomy 09/09/2016 09/09/2016  . Complication of anesthesia    slow to arouse post operatively   . COPD (chronic obstructive pulmonary disease) (HCC)   . 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 Generalized headaches   . Hernia, inguinal, right   . Hypertension   . Leg swelling     Tobacco History: Social History   Tobacco Use  Smoking Status Current Every Day Smoker  . Packs/day: 1.00  . Years: 47.00  . Pack years: 47.00  . Types: Cigarettes  Smokeless Tobacco Never Used  Tobacco Comment   1/2 ppd 02/25/20    Ready to quit: Not Answered Counseling given: Not Answered Comment: 1/2 ppd 02/25/20    Continue to not smoke  Outpatient Encounter Medications as of 02/25/2020  Medication Sig  . albuterol (VENTOLIN HFA) 108 (90 Base) MCG/ACT inhaler INHALE 2 PUFFS INTO THE LUNGS EVERY 6 HOURS AS NEEDED FOR WHEEZING OR SHORTNESS OF BREATH (Patient taking differently: Inhale 2 puffs into the lungs every 6 (six) hours as needed for wheezing or shortness of breath. )  . amLODipine (NORVASC) 2.5 MG tablet Take 1 tablet (2.5 mg total) by mouth  daily.  Marland Kitchen lisinopril (ZESTRIL) 10 MG tablet Take 1 tablet (10 mg total) by mouth daily.  . metoprolol succinate (TOPROL XL) 25 MG 24 hr tablet Take 1 tablet (25 mg total) by mouth daily.  . Multiple Vitamin (MULTIVITAMIN WITH MINERALS) TABS tablet Take 1 tablet by mouth every other day.  . promethazine (PHENERGAN) 25 MG tablet Take 1 tablet (25 mg total) by mouth every 8 (eight) hours as needed for nausea or vomiting.  . [DISCONTINUED] umeclidinium-vilanterol (ANORO ELLIPTA) 62.5-25 MCG/INH AEPB Inhale 1 puff into  the lungs daily.  . fluticasone-salmeterol (ADVAIR HFA) 115-21 MCG/ACT inhaler Inhale 2 puffs into the lungs 2 (two) times daily.  . [DISCONTINUED] Tiotropium Bromide Monohydrate (SPIRIVA RESPIMAT) 2.5 MCG/ACT AERS Inhale 2 puffs into the lungs daily.   No facility-administered encounter medications on file as of 02/25/2020.     Review of Systems  Review of Systems  No chest pain. No orthopnea or PND. Comprehensive ROS otherwise negative.  Physical Exam  BP 138/84 (BP Location: Left Arm, Cuff Size: Normal)   Pulse 83   Temp 97.7 F (36.5 C) (Temporal)   Ht 5\' 4"  (1.626 m)   Wt 152 lb (68.9 kg)   SpO2 96% Comment: on RA  BMI 26.09 kg/m   Wt Readings from Last 5 Encounters:  02/25/20 152 lb (68.9 kg)  01/24/20 151 lb (68.5 kg)  01/16/20 149 lb 9.6 oz (67.9 kg)  01/09/20 149 lb 0.5 oz (67.6 kg)  04/09/19 149 lb (67.6 kg)    BMI Readings from Last 5 Encounters:  02/25/20 26.09 kg/m  01/24/20 25.92 kg/m  01/16/20 25.68 kg/m  01/09/20 25.58 kg/m  04/09/19 25.58 kg/m     Physical Exam General: sitting up in exam chair, in NAD Eyes: EOMI, no icterus Respiratory: CTAB, distant sounds, no wheezing Ext: Warm, no edema   Assessment & Plan:   DOE: stable. PFTs with mild obstruction, significant bronchodilator response on most recent PFTs 01/2018. Suspect combo of mild COPD and asthma. Prescribed mid dose Advair HFA 2 puff BID.  Pulmonary nodule: single, 5.5  mm RLL nodule, LUNGRADS 3. 6 month f/u scan ordered as recommended.   Return in about 6 months (around 08/24/2020).   08/26/2020, MD 02/25/2020   This appointment required 48 minutes of patient care (this includes precharting, chart review, review of results, face-to-face care, etc.).

## 2020-02-25 NOTE — Patient Instructions (Addendum)
Nice to meet you.  OK to stop the [employer] Advair 2 puffs twice a day. Use it every day.  Rinse your mouth out after every use.   Due for a CT scan of your chest in December 2021 to follow a small spot in the bottom of your lung. Call us if you haven't been scheduled by the middle of December.  I checked your blood counts from a few weeks ago and no anemia - no need for B12.  Come back in 6 months.

## 2020-03-14 ENCOUNTER — Other Ambulatory Visit: Payer: Self-pay | Admitting: *Deleted

## 2020-03-14 NOTE — Patient Outreach (Signed)
[facility]) Care Management  [facility] Care Manager  03/14/2020   Maria Oliver 03-21-1961 [id number]   RN Health Coach telephone call to patient.  Hipaa compliance verified. Per patient she has not had any COPD exacerbations. Patient stated she is feeling better especially since the air is getting cooler. Patient stated she has had rental dental care. RN discussed with patient about the use of inhalers and washing mouth out after use. 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 Patient has not gotten the COVID vaccine. At this time she is not to take it. Patient is currently smoking and does not choose to stop at this time. Patient has agreed to further outreach calls.    Encounter Medications:  Outpatient Encounter Medications as of 03/14/2020  Medication Sig   albuterol (VENTOLIN HFA) 108 (90 Base) MCG/ACT inhaler INHALE 2 PUFFS INTO THE LUNGS EVERY 6 HOURS AS NEEDED FOR WHEEZING OR SHORTNESS OF BREATH (Patient taking differently: Inhale 2 puffs into the lungs every 6 (six) hours as needed for wheezing or shortness of breath. )   amLODipine (NORVASC) 2.5 MG tablet Take 1 tablet (2.5 mg total) by mouth daily.   fluticasone-salmeterol (ADVAIR HFA) 115-21 MCG/ACT inhaler Inhale 2 puffs into the lungs 2 (two) times daily.   lisinopril (ZESTRIL) 10 MG tablet Take 1 tablet (10 mg total) by mouth daily.   metoprolol succinate (TOPROL XL) 25 MG 24 hr tablet Take 1 tablet (25 mg total) by mouth daily.   Multiple Vitamin (MULTIVITAMIN WITH MINERALS) TABS tablet Take 1 tablet by mouth every other day.   promethazine (PHENERGAN) 25 MG tablet Take 1 tablet (25 mg total) by mouth every 8 (eight) hours as needed for nausea or vomiting.   No facility-administered encounter medications on file as of 03/14/2020.    Functional Status:  In your present state of health, do you have any difficulty performing the following activities: 09/12/2019 04/09/2019  Hearing? N N   Vision? Y N  Comment Patient still needs to schedule eye exam due to needing new glasses. Patient has delayed due to COVID -  Difficulty concentrating or making decisions? N N  Walking or climbing stairs? Y N  Comment shortness of breath with moderate exertion -  Dressing or bathing? N N  Doing errands, shopping? Y N  Preparing Food and eating ? N -  Using the Toilet? N -  In the past six months, have you accidently leaked urine? N -  Do you have problems with loss of bowel control? N -  Managing your Medications? N -  Managing your Finances? N -  Housekeeping or managing your Housekeeping? N -  Some recent data might be hidden    Fall/Depression Screening: Fall Risk  03/14/2020 01/16/2020 09/12/2019  Falls in the past year? 0 0 0  Number falls in past yr: 0 - 0  Injury with Fall? 0 - 0  Risk for fall due to : - No Fall Risks -  Follow up Falls evaluation completed - Falls evaluation completed   PHQ 2/9 Scores 05/29/2019 04/09/2019 03/16/2019 02/13/2019 12/11/2018 07/19/2018 02/06/2018  PHQ - 2 Score 2 2 0 0 4 4 4   PHQ- 9 Score 3 3 - - 8 8 12     Assessment:  Goals Addressed            This Visit's Progress    Eat Healthy       Follow Up Date   -  drink 6 to 8 glasses of water each day    Why is this important?   When you are ready to manage your nutrition or weight, having a plan and setting goals will help.  Taking small steps to change how you eat and exercise is a good place to start.    Notes:      Learn and Do Breathing Exercises       Follow Up Date [id number]   - do exercises in a comfortable position that makes breathing as easy as possible    Why is this important?   Breathing exercises can help lessen the cough that comes with chronic obstructive pulmonary disease.  Doing the exercises will give you more energy.  They will also help you to control your symptoms.    Notes:      Learn More About My Health       Follow Up Date  [id number]   - ask  questions - bring a list of my medicines to the visit - speak up when I don't understand    Why is this important?   The best way to learn about your health and care is by talking to the doctor and nurse.  They will answer your questions and give you information in the way that you like best.    Notes:      Quit smoking / using tobacco       Patient is not ready to stop Patient has received information on quit smoking     Track and Manage My Symptoms       Follow Up Date [id number]   - develop a rescue plan - eliminate symptom triggers at home - follow rescue plan if symptoms flare-up - keep follow-up appointments    Why is this important?   Tracking your symptoms and other information about your health helps your doctor plan your care.  Write down the symptoms, the time of day, what you were doing and what medicine you are taking.  You will soon learn how to manage your symptoms.     Notes:      Track and Manage My Triggers       Follow Up Date [id number]   - limit outdoor activity during cold weather    Why is this important?   Triggers are activities or things, like tobacco smoke or cold weather, that make your COPD (chronic obstructive pulmonary disease) flare-up.  Knowing these triggers helps you plan how to stay away from them.  When you cannot remove them, you can learn how to manage them.     Notes:  Patient has received information on stop smoklng Patient at this time is not ready to stop       Plan:  Patient will rinse mouth after using inhaler Patient knows symptoms and action plan for COPD Patient understands triggers of COPD exacerbation RN will follow up within the month of December RN sent update assessment to PCP  Gean Maidens BSN RN [facility] Care Management [phone]

## 2020-03-14 NOTE — Addendum Note (Signed)
Addended by: Luella Cook on: 03/14/2020 02:20 PM   Modules accepted: Orders

## 2020-03-19 ENCOUNTER — Other Ambulatory Visit: Payer: Self-pay | Admitting: *Deleted

## 2020-03-19 NOTE — Patient Outreach (Signed)
[facility]) Care Management  03/19/2020  NEVIN KOZUCH Aug 15, 1960 [id number]   CSW made initial contact with pt today and identity was confirmed.  CSW introduced self, role and reason for call; SSI questions.  Per pt, she is on SSI and will be turning 60 in February, 2022.  She does not know if she has to do anything prior to or at that time, as well, if her SS income will change.  She currently is eligible for Medicaid based on her SSI amount and the widows check she receives.  She is concerned that if her income changes at the age of 49 and increases it may make her ineligible for Medicaid.    CSW provided pt with the # to call [facility] Information Program). CSW will also investigate further through the Social Security Admin site and follow up with pt next week.   Pt denies any other concerns or needs; states she is very pleased with her [facility] plan.   Reece Levy, MSW, LCSW Clinical Social Worker  [facility] [phone]

## 2020-03-27 ENCOUNTER — Other Ambulatory Visit: Payer: Self-pay | Admitting: *Deleted

## 2020-04-01 NOTE — Patient Outreach (Signed)
[facility]) Care Management  04/01/2020  Maria Oliver 05/30/1961 [id number]   LATE ENTRY  CSW spoke with pt on 03/27/2020 who reports she is doing well.   CSW updated pt on her inquiry about SSI. Unfortunately, CSW was unable to get an answer to her question without providing pt's SS #. CSW provided pt with the contact # to call Social Security if she chooses. Unfortunately, per the [facility] rep, each case is different so unable to give CSW an exact answer.  CSW will sign off at this time and advise [facility] team of above.   Reece Levy, MSW, LCSW Clinical Social Worker  [facility] [phone]

## 2020-05-12 ENCOUNTER — Telehealth: Payer: Self-pay | Admitting: Pulmonary Disease

## 2020-05-12 DIAGNOSIS — J441 Chronic obstructive pulmonary disease with (acute) exacerbation: Secondary | ICD-10-CM

## 2020-05-12 MED ORDER — PREDNISONE 20 MG PO TABS: 40.0000 mg | ORAL_TABLET | Freq: Every day | ORAL | 0 refills | Status: AC

## 2020-05-12 MED ORDER — DOXYCYCLINE HYCLATE 100 MG PO TABS: 100.0000 mg | ORAL_TABLET | Freq: Two times a day (BID) | ORAL | 0 refills | Status: AC

## 2020-05-12 NOTE — Telephone Encounter (Signed)
I have called and spoke with pt and she is aware of meds sent to the pharmacy by [facility].  She voiced her understanding of following back up either here or at urgent care if not feeling better in the next 48 hours.

## 2020-05-12 NOTE — Telephone Encounter (Signed)
Called and spoke with pt and she stated that since Saturday she has been feeling terrible with headache, body aches, feeling weak, sweating at night so she feels that she is running a temp but has not checked it. She stated that her chest hurts like her lungs are hurting.  She stated that they normally give her abx and pred taper for this.  MH please advise. Thanks  Allergies  Allergen Reactions   Aspirin Other (See Comments)    REACTION: GI upset/Hernia    Ciprofloxacin Other (See Comments)    Tendonitis, joint pain, and nausea.  "Interfered with her HCTZ" (also)   Codeine Nausea And Vomiting and Other (See Comments)    (Also) GI upset/Hernia (CAN TOLERATE DILAUDID & MORPHINE)   Eggs Or Egg-Derived Products Nausea Only    Stomach pains   Fentanyl Nausea Only    Tolerates Dilaudid or morphine much better   Hydrocodone Nausea And Vomiting   Lactose Intolerance (Gi) Nausea And Vomiting and Other (See Comments)    (Also) vertigo   Oxycodone Nausea And Vomiting    Tolerates hydromorphone better   Penicillins Rash    Has patient had a PCN reaction causing immediate rash, facial/tongue/throat swelling, SOB or lightheadedness with hypotension: Yes Has patient had a PCN reaction causing severe rash involving mucus membranes or skin necrosis: No Has patient had a PCN reaction that required hospitalization No Has patient had a PCN reaction occurring within the last 10 years: No If all of the above answers are "NO", then may proceed with Cephalosporin use.    Current Outpatient Medications on File Prior to Visit  Medication Sig Dispense Refill   albuterol (VENTOLIN HFA) 108 (90 Base) MCG/ACT inhaler INHALE 2 PUFFS INTO THE LUNGS EVERY 6 HOURS AS NEEDED FOR WHEEZING OR SHORTNESS OF BREATH (Patient taking differently: Inhale 2 puffs into the lungs every 6 (six) hours as needed for wheezing or shortness of breath. ) 25.5 g 0   amLODipine (NORVASC) 2.5 MG tablet Take 1 tablet (2.5 mg total)  by mouth daily. 90 tablet 1   fluticasone-salmeterol (ADVAIR HFA) 115-21 MCG/ACT inhaler Inhale 2 puffs into the lungs 2 (two) times daily. 1 each 12   lisinopril (ZESTRIL) 10 MG tablet Take 1 tablet (10 mg total) by mouth daily. 90 tablet 1   metoprolol succinate (TOPROL XL) 25 MG 24 hr tablet Take 1 tablet (25 mg total) by mouth daily. 30 tablet 6   Multiple Vitamin (MULTIVITAMIN WITH MINERALS) TABS tablet Take 1 tablet by mouth every other day.     promethazine (PHENERGAN) 25 MG tablet Take 1 tablet (25 mg total) by mouth every 8 (eight) hours as needed for nausea or vomiting. 20 tablet 0   No current facility-administered medications on file prior to visit.

## 2020-05-16 ENCOUNTER — Ambulatory Visit: Payer: Medicare Other | Admitting: *Deleted

## 2020-05-21 ENCOUNTER — Telehealth: Payer: Self-pay | Admitting: Pulmonary Disease

## 2020-05-21 NOTE — Telephone Encounter (Signed)
Primary Pulmonologist: Hunsucker Last office visit and with whom: 02/25/20 with Hunsucker What do we see them for (pulmonary problems): centrilobular emphysema Last OV assessment/plan:  Assessment & Plan:   DOE: stable. PFTs with mild obstruction, significant bronchodilator response on most recent PFTs 01/2018. Suspect combo of mild COPD and asthma. Prescribed mid dose Advair HFA 2 puff BID.  Pulmonary nodule: single, 5.5 mm RLL nodule, LUNGRADS 3. 6 month f/u scan ordered as recommended.   Return in about 6 months (around 08/24/2020).   Karren Burly, MD 02/25/2020   This appointment required 48 minutes of patient care (this includes precharting, chart review, review of results, face-to-face care, etc.).     Patient Instructions by Karren Burly, MD at 02/25/2020 2:00 PM Author: Karren Burly, MD Author Type: Physician Filed: 02/25/2020 2:24 PM  Note Status: Addendum Cosign: Cosign Not Required Encounter Date: 02/25/2020  Editor: Karren Burly, MD (Physician)      Prior Versions: 1. Hunsucker, Lesia Sago, MD (Physician) at 02/25/2020 2:19 PM - Addendum   2. Hunsucker, Lesia Sago, MD (Physician) at 02/25/2020 2:17 PM - Addendum   3. Hunsucker, Lesia Sago, MD (Physician) at 02/25/2020 2:15 PM - Signed      Nice to meet you.  OK to stop the [employer] Advair 2 puffs twice a day. Use it every day.  Rinse your mouth out after every use.   Due for a CT scan of your chest in December 2021 to follow a small spot in the bottom of your lung. Call us if you haven't been scheduled by the middle of December.  I checked your blood counts from a few weeks ago and no anemia - no need for B12.  Come back in 6 months.      Was appointment offered to patient (explain)?  Pt wants recommendations   Reason for call: Called and spoke with pt who stated she has had chest tightness, increased SOB, pain in her back at shoulder blades as well as chest discomfort,  and wheezing since she called office 11/29. Pt stated she finished the abx and pred Rx about 2 days ago. Pt stated that she is feeling some better but is not fully over symptoms.  Pt states that she has soreness in her chest and back. Pt denies any complaints of fever. Pt stated the last time she did have a temp was 2 days ago as she did have chills during that time. Pt has been taking tylenol as she said she said she takes it on a daily basis. Pt last took tylenol about 2 hours ago.  Pt states that some of her symptoms that she did have when she first called the office 11/29 including chills, nausea are gone.  Pt is using her advair inhaler as prescribed. Pt has been using her ventolin inhaler using it at least twice daily.  Pt has not received her covid vaccines as she states due to what she is allergic to, she has been afraid to get vaccine. Asked pt if she has been around anyone that has had covid and she states that she has not that she is aware of. Pt states that she is wearing a mask everywhere she goes.  Pt wants to know what could be recommended to help with her symptoms. Pt said she would like to hear from Dr. Judeth Horn in regards to what he recommends. Pt aware that he is not in clinic today 12/8 but will be in clinic tomorrow  12/9 and is okay for this to wait until tomorrow when he returns to clinic.  Dr. Judeth Horn, please advise recommendations.   Allergies  Allergen Reactions  . Aspirin Other (See Comments)    REACTION: GI upset/Hernia   . Ciprofloxacin Other (See Comments)    Tendonitis, joint pain, and nausea.  "Interfered with her HCTZ" (also)  . Codeine Nausea And Vomiting and Other (See Comments)    (Also) GI upset/Hernia (CAN TOLERATE DILAUDID & MORPHINE)  . Eggs Or Egg-Derived Products Nausea Only    Stomach pains  . Fentanyl Nausea Only    Tolerates Dilaudid or morphine much better  . Hydrocodone Nausea And Vomiting  . Lactose Intolerance (Gi) Nausea And Vomiting and  Other (See Comments)    (Also) vertigo  . Oxycodone Nausea And Vomiting    Tolerates hydromorphone better  . Penicillins Rash    Has patient had a PCN reaction causing immediate rash, facial/tongue/throat swelling, SOB or lightheadedness with hypotension: Yes Has patient had a PCN reaction causing severe rash involving mucus membranes or skin necrosis: No Has patient had a PCN reaction that required hospitalization No Has patient had a PCN reaction occurring within the last 10 years: No If all of the above answers are "NO", then may proceed with Cephalosporin use.     Immunization History  Administered Date(s) Administered  . Tdap 10/07/2013

## 2020-05-22 ENCOUNTER — Other Ambulatory Visit: Payer: Self-pay | Admitting: *Deleted

## 2020-05-22 NOTE — Telephone Encounter (Signed)
Advise COVID test and chest xray. COVID test first.

## 2020-05-22 NOTE — Patient Instructions (Signed)
Goals Addressed            This Visit's Progress   . (THN)Eat Healthy   On track    Timeframe:  Long-Range Goal Priority:  Medium Start Date:  [id number]                           Expected End Date:      [id number]                   - drink 6 to 8 glasses of water each day    Why is this important?   When you are ready to manage your nutrition or weight, having a plan and setting goals will help.  Taking small steps to change how you eat and exercise is a good place to start.    Notes:     . (THN)Learn and Do Breathing Exercises   On track    Timeframe:  Long-Range Goal Priority:  Medium Start Date:  [id number]                           Expected End Date:                       - do exercises in a comfortable position that makes breathing as easy as possible    Why is this important?   Breathing exercises can help lessen the cough that comes with chronic obstructive pulmonary disease.  Doing the exercises will give you more energy.  They will also help you to control your symptoms.    Notes:     . (THN)Learn More About My Health   On track    Timeframe:  Long-Range Goal Priority:  High Start Date:     [id number]                        Expected End Date:     [id number]                    - ask questions - bring a list of my medicines to the visit - speak up when I don't understand    Why is this important?   The best way to learn about your health and care is by talking to the doctor and nurse.  They will answer your questions and give you information in the way that you like best.    Notes:     . (THN)Manage Fatigue (Tiredness-COPD)   Not on track    Timeframe:  Long-Range Goal Priority:  Medium Start Date:   05/22/2020                          Expected End Date:                          - eat healthy - get outdoors every day (weather permitting)    Why is this important?    Feeling tired or worn out is a common symptom of COPD (chronic obstructive pulmonary  disease).   Learning when you feel your best and when you need rest is important.   Managing the tiredness (fatigue) will help you be active and enjoy life.     Notes:     . (THN)Quit smoking / using tobacco  Not on track    Patient is not ready to stop Patient has received information on quit smoking    . (THN)Track and Manage My Symptoms   On track    Timeframe:  Short-Term Goal Priority:  High Start Date:   05/22/2020                         Expected End Date:                         - develop a rescue plan - eliminate symptom triggers at home - follow rescue plan if symptoms flare-up - keep follow-up appointments    Why is this important?   Tracking your symptoms and other information about your health helps your doctor plan your care.  Write down the symptoms, the time of day, what you were doing and what medicine you are taking.  You will soon learn how to manage your symptoms.     Notes:     . (THN)Track and Manage My Triggers   On track    Follow Up Date [id number]   - avoid second hand smoke - eliminate smoking in my home - limit outdoor activity during cold weather - listen for public air quality announcements every day    Why is this important?   Triggers are activities or things, like tobacco smoke or cold weather, that make your COPD (chronic obstructive pulmonary disease) flare-up.  Knowing these triggers helps you plan how to stay away from them.  When you cannot remove them, you can learn how to manage them.     Notes:  Patient has received information on stop smoklng Patient at this time is not ready to stop

## 2020-05-22 NOTE — Telephone Encounter (Signed)
Called and spoke with pt letting her know the info stated by Dr. Judeth Horn and pt verbalized understanding. Pt is scheduled tomorrow 12/10 to have a Covid test done at her pharmacy and I stated to her to call us once she has the results. Nothing further needed.

## 2020-05-22 NOTE — Patient Outreach (Signed)
[facility]) Care Management  [facility] Care Manager  05/22/2020   Maria Oliver 1961/05/07 [id number]  RN Health Coach telephone call to patient.  [employer] compliance verified. Per patient she is not feeling well. Patient stated she had been on antibiotics for 5 days for a cough, fever, fatigue, nausea, shortness of breath and a poor appetite. Patient stated she is waiting on a call back from Dr. She is feeling some better but the symptoms are still there. Patient stated she did not think she had been exposed to COVID, but she had her grandchildren off and on. She stated she wears her masks at all time when she goes out. Patient has agreed to further outreach calls Encounter Medications:  Outpatient Encounter Medications as of 05/22/2020  Medication Sig  . albuterol (VENTOLIN HFA) 108 (90 Base) MCG/ACT inhaler INHALE 2 PUFFS INTO THE LUNGS EVERY 6 HOURS AS NEEDED FOR WHEEZING OR SHORTNESS OF BREATH (Patient taking differently: Inhale 2 puffs into the lungs every 6 (six) hours as needed for wheezing or shortness of breath. )  . amLODipine (NORVASC) 2.5 MG tablet Take 1 tablet (2.5 mg total) by mouth daily.  . fluticasone-salmeterol (ADVAIR HFA) 115-21 MCG/ACT inhaler Inhale 2 puffs into the lungs 2 (two) times daily.  Marland Kitchen lisinopril (ZESTRIL) 10 MG tablet Take 1 tablet (10 mg total) by mouth daily.  . metoprolol succinate (TOPROL XL) 25 MG 24 hr tablet Take 1 tablet (25 mg total) by mouth daily.  . Multiple Vitamin (MULTIVITAMIN WITH MINERALS) TABS tablet Take 1 tablet by mouth every other day.  . promethazine (PHENERGAN) 25 MG tablet Take 1 tablet (25 mg total) by mouth every 8 (eight) hours as needed for nausea or vomiting.   No facility-administered encounter medications on file as of 05/22/2020.    Functional Status:  In your present state of health, do you have any difficulty performing the following activities: 09/12/2019  Hearing? N  Vision? Y  Comment Patient still needs to  schedule eye exam due to needing new glasses. Patient has delayed due to COVID  Difficulty concentrating or making decisions? N  Walking or climbing stairs? Y  Comment shortness of breath with moderate exertion  Dressing or bathing? N  Doing errands, shopping? Y  Preparing Food and eating ? N  Using the Toilet? N  In the past six months, have you accidently leaked urine? N  Do you have problems with loss of bowel control? N  Managing your Medications? N  Managing your Finances? N  Housekeeping or managing your Housekeeping? N  Some recent data might be hidden    Fall/Depression Screening: Fall Risk  03/14/2020 01/16/2020 09/12/2019  Falls in the past year? 0 0 0  Number falls in past yr: 0 - 0  Injury with Fall? 0 - 0  Risk for fall due to : - No Fall Risks -  Follow up Falls evaluation completed - Falls evaluation completed   PHQ 2/9 Scores 05/29/2019 04/09/2019 03/16/2019 02/13/2019 12/11/2018 07/19/2018 02/06/2018  PHQ - 2 Score 2 2 0 0 4 4 4   PHQ- 9 Score 3 3 - - 8 8 12     Assessment:  Goals Addressed            This Visit's Progress   . (THN)Eat Healthy   On track    Timeframe:  Long-Range Goal Priority:  Medium Start Date:   Expected End Date:      [id number]                   - drink 6 to 8 glasses of water each day    Why is this important?   When you are ready to manage your nutrition or weight, having a plan and setting goals will help.  Taking small steps to change how you eat and exercise is a good place to start.    Notes:     . (THN)Learn and Do Breathing Exercises   On track    Timeframe:  Long-Range Goal Priority:  Medium Start Date:  [id number]                           Expected End Date:                       - do exercises in a comfortable position that makes breathing as easy as possible    Why is this important?   Breathing exercises can help lessen the cough that comes with chronic obstructive pulmonary disease.   Doing the exercises will give you more energy.  They will also help you to control your symptoms.    Notes:     . (THN)Learn More About My Health   On track    Timeframe:  Long-Range Goal Priority:  High Start Date:     [id number]                        Expected End Date:     [id number]                    - ask questions - bring a list of my medicines to the visit - speak up when I don't understand    Why is this important?   The best way to learn about your health and care is by talking to the doctor and nurse.  They will answer your questions and give you information in the way that you like best.    Notes:     . (THN)Manage Fatigue (Tiredness-COPD)   Not on track    Timeframe:  Long-Range Goal Priority:  Medium Start Date:   05/22/2020                          Expected End Date:                          - eat healthy - get outdoors every day (weather permitting)    Why is this important?    Feeling tired or worn out is a common symptom of COPD (chronic obstructive pulmonary disease).   Learning when you feel your best and when you need rest is important.   Managing the tiredness (fatigue) will help you be active and enjoy life.     Notes:     . (THN)Quit smoking / using tobacco   Not on track    Patient is not ready to stop Patient has received information on quit smoking    . (THN)Track and Manage My Symptoms   On track    Timeframe:  Short-Term Goal Priority:  High Start Date:   05/22/2020  Expected End Date:                         - develop a rescue plan - eliminate symptom triggers at home - follow rescue plan if symptoms flare-up - keep follow-up appointments    Why is this important?   Tracking your symptoms and other information about your health helps your doctor plan your care.  Write down the symptoms, the time of day, what you were doing and what medicine you are taking.  You will soon learn how to manage your symptoms.      Notes:     . (THN)Track and Manage My Triggers   On track    Follow Up Date [id number]   - avoid second hand smoke - eliminate smoking in my home - limit outdoor activity during cold weather - listen for public air quality announcements every day    Why is this important?   Triggers are activities or things, like tobacco smoke or cold weather, that make your COPD (chronic obstructive pulmonary disease) flare-up.  Knowing these triggers helps you plan how to stay away from them.  When you cannot remove them, you can learn how to manage them.     Notes:  Patient has received information on stop smoklng Patient at this time is not ready to stop       Plan:  Follow-up:  Patient agrees to Care Plan and Follow-up. RN helped schedule patient a COVID test set for 05/23/2020 RN discussed COVID vaccine Patient is awaiting pulmonary Dr call RN will follow up within the month of January RN sent update assessment to PCP  Gean Maidens BSN RN [facility] Care Management [phone]

## 2020-05-22 NOTE — Telephone Encounter (Signed)
ATC x1.  Left VM for patient to get covid tested and to call the office with the result of the test and then we can order a CXR for her to be done in the office, but only after we receive a negative covid test result.  Advised to call the office back.  Will await a return call.

## 2020-06-25 ENCOUNTER — Other Ambulatory Visit: Payer: Self-pay | Admitting: *Deleted

## 2020-06-25 NOTE — Patient Outreach (Signed)
[facility]) Care Management  06/25/2020  Maria Oliver 03/12/1961 [id number]  RN Health Coach attempted follow up outreach call to patient.  Patient was unavailable. HIPPA compliance voicemail message left with return callback number.  Plan: RN will call patient again within 30 days.  Gean Maidens BSN RN [facility] Care Management [phone]

## 2020-07-08 ENCOUNTER — Other Ambulatory Visit: Payer: Self-pay | Admitting: Family Medicine

## 2020-07-08 DIAGNOSIS — I10 Essential (primary) hypertension: Secondary | ICD-10-CM

## 2020-07-29 ENCOUNTER — Ambulatory Visit
Admission: RE | Admit: 2020-07-29 | Discharge: 2020-07-29 | Disposition: A | Discharge status: Home / Self Care | Payer: Medicare Other | Source: Ambulatory Visit | Attending: Acute Care | Admitting: Acute Care

## 2020-07-29 ENCOUNTER — Other Ambulatory Visit: Payer: Self-pay

## 2020-07-29 DIAGNOSIS — Z87891 Personal history of nicotine dependence: Secondary | ICD-10-CM | POA: Diagnosis present

## 2020-07-29 DIAGNOSIS — F17210 Nicotine dependence, cigarettes, uncomplicated: Secondary | ICD-10-CM

## 2020-07-30 ENCOUNTER — Ambulatory Visit: Payer: Self-pay | Admitting: *Deleted

## 2020-08-05 NOTE — Progress Notes (Signed)
Please call patient and let them  know their  low dose Ct was read as a Lung RADS 2: nodules that are benign in appearance and behavior with a very low likelihood of becoming a clinically active cancer due to size or lack of growth. Recommendation per radiology is for a repeat LDCT in 12 months. .Please let them  know we will order and schedule their  annual screening scan for 07/2021. Please let them  know there was notation of CAD on their  scan.  Please remind the patient  that this is a non-gated exam therefore degree or severity of disease  cannot be determined. Please have them  follow up with their PCP regarding potential risk factor modification, dietary therapy or pharmacologic therapy if clinically indicated. Pt.  is not  currently on statin therapy. Please place order for annual  screening scan for  07/2021 and fax results to PCP. Thanks so much. 

## 2020-08-06 ENCOUNTER — Telehealth: Payer: Self-pay | Admitting: Pulmonary Disease

## 2020-08-06 DIAGNOSIS — Z87891 Personal history of nicotine dependence: Secondary | ICD-10-CM

## 2020-08-06 DIAGNOSIS — F17210 Nicotine dependence, cigarettes, uncomplicated: Secondary | ICD-10-CM

## 2020-08-07 NOTE — Telephone Encounter (Signed)
Pt informed of CT results per Sarah Groce, NP.  PT verbalized understanding.  Copy sent to PCP.  Order placed for 1 yr f/u CT.  

## 2020-08-27 ENCOUNTER — Other Ambulatory Visit: Payer: Self-pay | Admitting: *Deleted

## 2020-08-27 NOTE — Patient Outreach (Signed)
[facility]) Care Management  08/27/2020  Maria Oliver 05/09/1961 [id number]  RN Health Coach attempted follow up outreach call to patient.  Patient was unavailable. HIPPA compliance voicemail message left with return callback number.  Plan: RN will call patient again within 30 days.  Gean Maidens BSN RN [facility] Care Management [phone]

## 2020-09-05 ENCOUNTER — Ambulatory Visit (INDEPENDENT_AMBULATORY_CARE_PROVIDER_SITE_OTHER): Payer: Medicare Other | Admitting: Primary Care

## 2020-09-05 ENCOUNTER — Telehealth: Payer: Self-pay | Admitting: Pulmonary Disease

## 2020-09-05 DIAGNOSIS — J441 Chronic obstructive pulmonary disease with (acute) exacerbation: Secondary | ICD-10-CM

## 2020-09-05 MED ORDER — DOXYCYCLINE HYCLATE 100 MG PO TABS: 100.0000 mg | ORAL_TABLET | Freq: Two times a day (BID) | ORAL | 0 refills | Status: DC

## 2020-09-05 MED ORDER — PREDNISONE 10 MG PO TABS: ORAL_TABLET | ORAL | 0 refills | Status: DC

## 2020-09-05 NOTE — Telephone Encounter (Signed)
Called and spoke with pt letting her know that we need to schedule a televisit for further evaluation and she verbalized understanding. Televisit scheduled for pt with Beth at 2:30. Nothing further needed.

## 2020-09-05 NOTE — Telephone Encounter (Signed)
Patient is sick , definitely needs Covid 19 test, has not be vaccinated.  Not seen for 6 months  Could be set up for telemedicine visit this evening , openings on [place] or [place] schedule if she is open to this   Please contact office for sooner follow up if symptoms do not improve or worsen or seek emergency care

## 2020-09-05 NOTE — Progress Notes (Signed)
Virtual Visit via Telephone Note  I connected with Maria Oliver on 09/05/20 at  2:30 PM EDT by telephone and verified that I am speaking with the correct person using two identifiers.  Location: Patient: Home Provider: Office   I discussed the limitations, risks, security and privacy concerns of performing an evaluation and management service by telephone and the availability of in person appointments. I also discussed with the patient that there may be a patient responsible charge related to this service. The patient expressed understanding and agreed to proceed.   History of Present Illness: 60 year old female, current every day smoker. PMH significant for COPD, emphysema, HTN, chronic pain syndrome. Patient of Dr. Judeth Horn, last seen on 02/25/20. PFTs in 2019 showed mild obstruction with significant BD response. Maintained on Advair 115-28mcg HFA.   09/05/2020 Patient contacted today for acute televisit. She developed productive cough with thick yellow mucus two days. Associated right shoulder pain, chest tightness and wheezing. She is compliant with Advair two puffs twice daily. She used her Ventolin rescue inhaler 1-2 times over the last day with temporary improvement. No sick contacts. She had recent LDCT for lung cancer screening in February.  Denies fever.   Observations/Objective:  - Able to speak in full sentences; audible congestion, occasional cough   Imaging:  07/30/2020 LDCT- Lung RADS 2, benign appearance or behavior. Centrilobular and paraseptal emphysema.  Unchanged chronic scarring in lingula.  New geographic areas of groundglass attenuation both lower lungs favored to represent sequelae of infection or inflammation.   Assessment and Plan:  COPD exacerbation - Productive cough x 2 days with associated chest tightness/wheezing - Rx doxycyline 1 tab twice daily x 7 days and prednisone taper as directed - Recommend mucinex 600mg  twice daily - Continue Advair hfa  115-52mcg two puffs twice daily   Follow Up Instructions: - Patient to notify 22m if not better in 5-7 days   I discussed the assessment and treatment plan with the patient. The patient was provided an opportunity to ask questions and all were answered. The patient agreed with the plan and demonstrated an understanding of the instructions.   The patient was advised to call back or seek an in-person evaluation if the symptoms worsen or if the condition fails to improve as anticipated.  I provided 15 minutes of non-face-to-face time during this encounter.   [place], NP

## 2020-09-05 NOTE — Telephone Encounter (Signed)
Primary Pulmonologist: Hunsucker Last office visit and with whom: 02/25/20 with MH What do we see them for (pulmonary problems): emphysema Last OV assessment/plan: Patient Instructions by Karren Burly, MD at 02/25/2020 2:00 PM  Author: Karren Burly, MD Author Type: Physician Filed: 02/25/2020 2:24 PM  Note Status: Addendum Cosign: Cosign Not Required Encounter Date: 02/25/2020  Editor: Karren Burly, MD (Physician)      Prior Versions: 1. Hunsucker, Lesia Sago, MD (Physician) at 02/25/2020 2:19 PM - Addendum   2. Hunsucker, Lesia Sago, MD (Physician) at 02/25/2020 2:17 PM - Addendum   3. Hunsucker, Lesia Sago, MD (Physician) at 02/25/2020 2:15 PM - Signed    Nice to meet you.  OK to stop the [employer] Advair 2 puffs twice a day. Use it every day.  Rinse your mouth out after every use.   Due for a CT scan of your chest in December 2021 to follow a small spot in the bottom of your lung. Call us if you haven't been scheduled by the middle of December.  I checked your blood counts from a few weeks ago and no anemia - no need for B12.  Come back in 6 months.         Was appointment offered to patient (explain)?  Pt wants recommendations   Reason for call: Called and spoke with pt who states she has had symptoms of cough with yellow phlegm, pain in the center of her chest which is also in the center of her back, pain in right shoulder, and also increased SOB which began 2 days ago.  Pt denies any complaints of fever, has not checked her temp but does not feel feverish.  Pt has been using her Advair HFA as prescribed. Pt stated that she did use her rescue inhaler once yesterday 3/24 which she said did help with her breathing.  Pt has not had a covid test performed within the last 5 days and stated that she is not going to get a covid test due to saying that she does not have covid. Pt believes that her symptoms are due to her having bronchitis.  Pt has not  had her covid vaccines and is unsure if she will be getting them. Pt does not believe that she has been around anyone that has had covid.  Pt is wanting recommendations to help with her symptoms.  Due to Dr. Judeth Horn being out of the office today, since pt has seen TP recently sending this to her for recommendations. Tammy, please advise.    Allergies  Allergen Reactions  . Aspirin Other (See Comments)    REACTION: GI upset/Hernia   . Ciprofloxacin Other (See Comments)    Tendonitis, joint pain, and nausea.  "Interfered with her HCTZ" (also)  . Codeine Nausea And Vomiting and Other (See Comments)    (Also) GI upset/Hernia (CAN TOLERATE DILAUDID & MORPHINE)  . Eggs Or Egg-Derived Products Nausea Only    Stomach pains  . Fentanyl Nausea Only    Tolerates Dilaudid or morphine much better  . Hydrocodone Nausea And Vomiting  . Lactose Intolerance (Gi) Nausea And Vomiting and Other (See Comments)    (Also) vertigo  . Oxycodone Nausea And Vomiting    Tolerates hydromorphone better  . Penicillins Rash    Has patient had a PCN reaction causing immediate rash, facial/tongue/throat swelling, SOB or lightheadedness with hypotension: Yes Has patient had a PCN reaction causing severe rash involving mucus membranes or skin necrosis: No Has  patient had a PCN reaction that required hospitalization No Has patient had a PCN reaction occurring within the last 10 years: No If all of the above answers are "NO", then may proceed with Cephalosporin use.     Immunization History  Administered Date(s) Administered  . Tdap 10/07/2013

## 2020-09-05 NOTE — Patient Instructions (Signed)
Doxycycline x 1 week Prednisone taper as directed Call if not better in 5-7 days, would recommend getting CXR

## 2020-09-25 ENCOUNTER — Other Ambulatory Visit: Payer: Self-pay | Admitting: *Deleted

## 2020-09-25 NOTE — Patient Outreach (Signed)
[facility]) Care Management  [facility] Care Manager  09/25/2020   Maria Oliver [birth year] [phone]  RN Health Coach telephone call to patient.  [employer] compliance verified. Per patient she had an episode of productive cough with thick yellow sputum. She was place on antibiotics and prednisone. Per patient her symptoms are much better. Her sputum is now clear. Patient stated she is still currently smoking. Patient has not taken the COVID vaccine.  Patient is using the Advair inhaler and Ventolin inhaler. Patient has agreed to outreach calls. .   Encounter Medications:  Outpatient Encounter Medications as of 09/25/2020  Medication Sig  . albuterol (VENTOLIN HFA) 108 (90 Base) MCG/ACT inhaler INHALE 2 PUFFS INTO THE LUNGS EVERY 6 HOURS AS NEEDED FOR WHEEZING OR SHORTNESS OF BREATH (Patient taking differently: Inhale 2 puffs into the lungs every 6 (six) hours as needed for wheezing or shortness of breath.)  . amLODipine (NORVASC) 2.5 MG tablet Take 1 tablet (2.5 mg total) by mouth daily. (Patient not taking: Reported on 09/05/2020)  . doxycycline (VIBRA-TABS) 100 MG tablet Take 1 tablet (100 mg total) by mouth 2 (two) times daily.  . fluticasone-salmeterol (ADVAIR HFA) 115-21 MCG/ACT inhaler Inhale 2 puffs into the lungs 2 (two) times daily.  Marland Kitchen lisinopril (ZESTRIL) 10 MG tablet TAKE 1 TABLET(10 MG) BY MOUTH DAILY  . Multiple Vitamin (MULTIVITAMIN WITH MINERALS) TABS tablet Take 1 tablet by mouth every other day.  . predniSONE (DELTASONE) 10 MG tablet Take 4 tabs po daily x 2 days; then 3 tabs for 2 days; then 2 tabs for 2 days; then 1 tab for 2 days   No facility-administered encounter medications on file as of 09/25/2020.    Functional Status:  No flowsheet data found.  Fall/Depression Screening: Fall Risk  09/25/2020 03/14/2020 01/16/2020  Falls in the past year? 0 0 0  Number falls in past yr: 0 0 -  Injury with Fall? 0 0 -  Risk for fall due to : - - No Fall Risks  Follow  up Falls evaluation completed Falls evaluation completed -   PHQ 2/9 Scores 09/25/2020 05/29/2019 04/09/2019 03/16/2019 02/13/2019 12/11/2018 07/19/2018  PHQ - 2 Score 2 2 2  0 0 4 4  PHQ- 9 Score 3 3 3  - - 8 8    Assessment:  Goals Addressed            This Visit's Progress   . (THN)Eat Healthy   On track    Timeframe:  Long-Range Goal Priority:  Medium Start Date:                            Expected End Date:             - drink 6 to 8 glasses of water each day    Why is this important?   When you are ready to manage your nutrition or weight, having a plan and setting goals will help.  Taking small steps to change how you eat and exercise is a good place to start.    Notes:  [id number] Patient is decreasing the sodium in her diet    . (THN)Learn and Do Breathing Exercises   On track    Timeframe:  Long-Range Goal Priority:  Medium Start Date:  [id number]                           Expected  End Date:  [id number]                     - do exercises in a comfortable position that makes breathing as easy as possible    Why is this important?   Breathing exercises can help lessen the cough that comes with chronic obstructive pulmonary disease.  Doing the exercises will give you more energy.  They will also help you to control your symptoms.    Notes:     . (THN)Learn More About My Health   On track    Timeframe:  Long-Range Goal Priority:  High Start Date:     30160109                        Expected End Date:   32355732               - ask questions - bring a list of my medicines to the visit - speak up when I don't understand    Why is this important?   The best way to learn about your health and care is by talking to the doctor and nurse.  They will answer your questions and give you information in the way that you like best.    Notes:     . (THN)Manage Fatigue (Tiredness-COPD)   On track    Timeframe:  Long-Range Goal Priority:  Medium Start Date:    05/22/2020                          Expected End Date:    20254270                      - eat healthy - get outdoors every day (weather permitting)    Why is this important?    Feeling tired or worn out is a common symptom of COPD (chronic obstructive pulmonary disease).   Learning when you feel your best and when you need rest is important.   Managing the tiredness (fatigue) will help you be active and enjoy life.     Notes:     . (THN)Quit smoking / using tobacco   On track    Timeframe:  Long-Range Goal Priority:  Medium Start Date:    [id number]                         Expected End Date:   15176160                   Patient is not ready to stop Patient has received information on quit smoking    . (THN)Track and Manage My Symptoms   On track    Timeframe:  Short-Term Goal Priority:  High Start Date:   73710626                         Expected End Date:  94854627                       - develop a rescue plan - eliminate symptom triggers at home - follow rescue plan if symptoms flare-up - keep follow-up appointments    Why is this important?   Tracking your symptoms and other information about your health helps your doctor plan your care.  Write down the symptoms, the time of  day, what you were doing and what medicine you are taking.  You will soon learn how to manage your symptoms.     Notes:  [id number] Patient is monitoring her symptoms closely    . (THN)Track and Manage My Triggers   On track    Timeframe:  Long-Range Goal Priority:  High Start Date:      33545625                       Expected End Date:    63893734                  Follow Up Date [id number]   - avoid second hand smoke - eliminate smoking in my home - limit outdoor activity during cold weather - listen for public air quality announcements every day    Why is this important?   Triggers are activities or things, like tobacco smoke or cold weather, that make your COPD (chronic obstructive  pulmonary disease) flare-up.  Knowing these triggers helps you plan how to stay away from them.  When you cannot remove them, you can learn how to manage them.     Notes:  Patient has received information on stop smoklng Patient at this time is not ready to stop 09/25/2020 Patient used her COPD action plan when symptoms occurred to prevent exacerbation       Plan:  Follow-up:  Patient agrees to Care Plan and Follow-up.  RN ordered Free COVID tests for patient RN sent update assessment to PCP RN will follow up outreach within the month of October  Gean Maidens BSN RN [facility] Care Management [phone]

## 2020-09-25 NOTE — Patient Instructions (Signed)
Goals Addressed            This Visit's Progress   . (THN)Eat Healthy   On track    Timeframe:  Long-Range Goal Priority:  Medium Start Date:  [id number]                           Expected End Date:      44010272        - drink 6 to 8 glasses of water each day    Why is this important?   When you are ready to manage your nutrition or weight, having a plan and setting goals will help.  Taking small steps to change how you eat and exercise is a good place to start.    Notes:  [id number] Patient is decreasing the sodium in her diet    . (THN)Learn and Do Breathing Exercises   On track    Timeframe:  Long-Range Goal Priority:  Medium Start Date:  47425956                           Expected End Date:  38756433                     - do exercises in a comfortable position that makes breathing as easy as possible    Why is this important?   Breathing exercises can help lessen the cough that comes with chronic obstructive pulmonary disease.  Doing the exercises will give you more energy.  They will also help you to control your symptoms.    Notes:     . (THN)Learn More About My Health   On track    Timeframe:  Long-Range Goal Priority:  High Start Date:     29518841                        Expected End Date:   66063016               - ask questions - bring a list of my medicines to the visit - speak up when I don't understand    Why is this important?   The best way to learn about your health and care is by talking to the doctor and nurse.  They will answer your questions and give you information in the way that you like best.    Notes:     . (THN)Manage Fatigue (Tiredness-COPD)   On track    Timeframe:  Long-Range Goal Priority:  Medium Start Date:   05/22/2020                          Expected End Date:    01093235                      - eat healthy - get outdoors every day (weather permitting)    Why is this important?    Feeling tired or worn out is a common  symptom of COPD (chronic obstructive pulmonary disease).   Learning when you feel your best and when you need rest is important.   Managing the tiredness (fatigue) will help you be active and enjoy life.     Notes:     . (THN)Quit smoking / using tobacco   On track    Timeframe:  Long-Range Goal  Priority:  Medium Start Date:    24462863                         Expected End Date:   81771165                   Patient is not ready to stop Patient has received information on quit smoking    . (THN)Track and Manage My Symptoms   On track    Timeframe:  Short-Term Goal Priority:  High Start Date:   79038333                         Expected End Date:  83291916                       - develop a rescue plan - eliminate symptom triggers at home - follow rescue plan if symptoms flare-up - keep follow-up appointments    Why is this important?   Tracking your symptoms and other information about your health helps your doctor plan your care.  Write down the symptoms, the time of day, what you were doing and what medicine you are taking.  You will soon learn how to manage your symptoms.     Notes:  [id number] Patient is monitoring her symptoms closely    . (THN)Track and Manage My Triggers   On track    Timeframe:  Long-Range Goal Priority:  High Start Date:      97741423                       Expected End Date:    95320233                  Follow Up Date [id number]   - avoid second hand smoke - eliminate smoking in my home - limit outdoor activity during cold weather - listen for public air quality announcements every day    Why is this important?   Triggers are activities or things, like tobacco smoke or cold weather, that make your COPD (chronic obstructive pulmonary disease) flare-up.  Knowing these triggers helps you plan how to stay away from them.  When you cannot remove them, you can learn how to manage them.     Notes:  Patient has received information on stop  smoklng Patient at this time is not ready to stop 09/25/2020 Patient used her COPD action plan when symptoms occurred to prevent exacerbation

## 2020-10-23 ENCOUNTER — Ambulatory Visit: Payer: Self-pay | Admitting: *Deleted

## 2020-10-23 NOTE — Telephone Encounter (Signed)
Not feeling well for several weeks- increased belching, headache- left sided head pain, chest pain this morning. Patient states she is feeling some better- but she is sore in her chest and has headache, dizziness and feels shaky.- patient states she feels her BP is out of control- but has no way to check it. Advised ED per protocol.  Reason for Disposition . Dizziness or lightheadedness  Answer Assessment - Initial Assessment Questions 1. LOCATION: "Where does it hurt?"       Left side- around the heart 2. RADIATION: "Does the pain go anywhere else?" (e.g., into neck, jaw, arms, back)     Neck,back and arms are painful- hx of pinched nerve 3. ONSET: "When did the chest pain begin?" (Minutes, hours or days)      1 week ago- worse this morning 4. PATTERN "Does the pain come and go, or has it been constant since it started?"  "Does it get worse with exertion?"      Comes and goes, worse with exertion 5. DURATION: "How long does it last" (e.g., seconds, minutes, hours)     Sore today- bad pain-10 minutes this morning 6. SEVERITY: "How bad is the pain?"  (e.g., Scale 1-10; mild, moderate, or severe)    - MILD (1-3): doesn't interfere with normal activities     - MODERATE (4-7): interferes with normal activities or awakens from sleep    - SEVERE (8-10): excruciating pain, unable to do any normal activities       4 7. CARDIAC RISK FACTORS: "Do you have any history of heart problems or risk factors for heart disease?" (e.g., angina, prior heart attack; diabetes, high blood pressure, high cholesterol, smoker, or strong family history of heart disease)     High blood pressure, smoker 8. PULMONARY RISK FACTORS: "Do you have any history of lung disease?"  (e.g., blood clots in lung, asthma, emphysema, birth control pills)     COPD, possible asthma 9. CAUSE: "What do you think is causing the chest pain?"     Patient thinks that maybe it's her heart 10. OTHER SYMPTOMS: "Do you have any other symptoms?"  (e.g., dizziness, nausea, vomiting, sweating, fever, difficulty breathing, cough)       Shaky, stomach pain,dizziness 11. PREGNANCY: "Is there any chance you are pregnant?" "When was your last menstrual period?"       n/a  Protocols used: CHEST PAIN-A-AH

## 2020-10-27 ENCOUNTER — Emergency Department (HOSPITAL_COMMUNITY)
Admission: EM | Admit: 2020-10-27 | Discharge: 2020-10-27 | Disposition: A | Discharge status: Home / Self Care | Payer: Medicare Other | Attending: Emergency Medicine | Admitting: Emergency Medicine

## 2020-10-27 ENCOUNTER — Other Ambulatory Visit: Payer: Self-pay

## 2020-10-27 ENCOUNTER — Encounter (HOSPITAL_COMMUNITY): Payer: Self-pay | Admitting: *Deleted

## 2020-10-27 ENCOUNTER — Emergency Department (HOSPITAL_COMMUNITY): Payer: Medicare Other

## 2020-10-27 DIAGNOSIS — J45909 Unspecified asthma, uncomplicated: Secondary | ICD-10-CM | POA: Insufficient documentation

## 2020-10-27 DIAGNOSIS — I10 Essential (primary) hypertension: Secondary | ICD-10-CM | POA: Diagnosis not present

## 2020-10-27 DIAGNOSIS — F17210 Nicotine dependence, cigarettes, uncomplicated: Secondary | ICD-10-CM | POA: Insufficient documentation

## 2020-10-27 DIAGNOSIS — J449 Chronic obstructive pulmonary disease, unspecified: Secondary | ICD-10-CM | POA: Insufficient documentation

## 2020-10-27 DIAGNOSIS — Z7951 Long term (current) use of inhaled steroids: Secondary | ICD-10-CM | POA: Diagnosis not present

## 2020-10-27 DIAGNOSIS — R0789 Other chest pain: Secondary | ICD-10-CM | POA: Insufficient documentation

## 2020-10-27 DIAGNOSIS — R079 Chest pain, unspecified: Secondary | ICD-10-CM

## 2020-10-27 DIAGNOSIS — Z20822 Contact with and (suspected) exposure to covid-19: Secondary | ICD-10-CM | POA: Diagnosis not present

## 2020-10-27 DIAGNOSIS — R053 Chronic cough: Secondary | ICD-10-CM | POA: Diagnosis not present

## 2020-10-27 DIAGNOSIS — Z79899 Other long term (current) drug therapy: Secondary | ICD-10-CM | POA: Insufficient documentation

## 2020-10-27 LAB — CBC
HCT: 46.3 % — ABNORMAL HIGH (ref 36.0–46.0)
Hemoglobin: 14.8 g/dL (ref 12.0–15.0)
MCH: 28.3 pg (ref 26.0–34.0)
MCHC: 32 g/dL (ref 30.0–36.0)
MCV: 88.5 fL (ref 80.0–100.0)
Platelets: 329 10*3/uL (ref 150–400)
RBC: 5.23 MIL/uL — ABNORMAL HIGH (ref 3.87–5.11)
RDW: 14.1 % (ref 11.5–15.5)
WBC: 8.4 10*3/uL (ref 4.0–10.5)
nRBC: 0 % (ref 0.0–0.2)

## 2020-10-27 LAB — BASIC METABOLIC PANEL
Anion gap: 7 (ref 5–15)
BUN: 6 mg/dL — ABNORMAL LOW (ref 8–23)
CO2: 29 mmol/L (ref 22–32)
Calcium: 9.2 mg/dL (ref 8.9–10.3)
Chloride: 105 mmol/L (ref 98–111)
Creatinine, Ser: 0.75 mg/dL (ref 0.44–1.00)
GFR, Estimated: >60 mL/min (ref >60)
Glucose, Bld: 115 mg/dL — ABNORMAL HIGH (ref 70–99)
Potassium: 4.1 mmol/L (ref 3.5–5.1)
Sodium: 141 mmol/L (ref 135–145)

## 2020-10-27 LAB — TROPONIN I (HIGH SENSITIVITY)
Troponin I (High Sensitivity): 7 ng/L (ref <18)
Troponin I (High Sensitivity): 6 ng/L (ref <18)

## 2020-10-27 MED ORDER — ACETAMINOPHEN 325 MG PO TABS
650.0000 mg | ORAL_TABLET | Freq: Once | ORAL | Status: AC
  Administered 2020-10-27: 650 mg via ORAL
  Filled 2020-10-27: qty 2

## 2020-10-27 MED ORDER — IPRATROPIUM-ALBUTEROL 0.5-2.5 (3) MG/3ML IN SOLN
3.0000 mL | Freq: Once | RESPIRATORY_TRACT | Status: AC
  Administered 2020-10-27: 3 mL via RESPIRATORY_TRACT
  Filled 2020-10-27: qty 3

## 2020-10-27 NOTE — ED Triage Notes (Signed)
C/o chest pain onset 3 weeks ago , states she was told to come to the ed for further eval. And her doctor told her she wouldn't refill her medication until she was seen and had an ekg.

## 2020-10-27 NOTE — ED Provider Notes (Signed)
[facility] EMERGENCY DEPARTMENT Provider Note   CSN: [id number] Arrival date & time: 10/27/20  1330     History Chief Complaint  Patient presents with  . Chest Pain    Maria Oliver is a 60 y.o. female.  HPI   60 year old female with past medical history of anxiety, COPD, HTN presents to the emergency department with concern for chest pain.  Patient states for the past week she has been having about 10 to 15 minutes of midsternal and left-sided chest discomfort.  She states that self resolves after about 15 minutes.  Mostly worse with movement and palpation.  She has a chronic cough with slight increased sputum.  Denies any exertional chest pain, shortness of breath, swelling of her lower extremities, fever.  No known cardiac history.  Currently she has no active chest pain.  Past Medical History:  Diagnosis Date  . Abdominal pain   . Anxiety   . Arthritis   . Asthma   . Chronic cholecystitis with calculus s/p lap cholecystectomy 09/09/2016 09/09/2016  . Complication of anesthesia    slow to arouse post operatively   . COPD (chronic obstructive pulmonary disease) (HCC)   . 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 Generalized headaches   . Hernia, inguinal, right   . Hypertension   . Leg swelling     Patient Active Problem List   Diagnosis Date Noted  . 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 Pruritic rash 10/14/2016  . Chronic narcotic use 09/09/2016  . Onychomycosis of right great toe 10/13/2015  . Osteoarthritis 08/11/2015  . Chronic abdominal pain 05/22/2015  . Chronic pain syndrome 05/22/2015  . Vitamin D insufficiency 02/06/2015  . Chronic knee pain 02/06/2015  . Ventral hernia without obstruction or gangrene 12/31/2014  . Anxiety 12/31/2014  . COPD (chronic obstructive pulmonary disease) with emphysema (HCC)   . Tobacco abuse 10/01/2009  . Essential hypertension 10/01/2009     Past Surgical History:  Procedure Laterality Date  . COLON SURGERY  2010   per patient "colon take down"  . COLOSTOMY  2010  . LAPAROSCOPIC CHOLECYSTECTOMY SINGLE SITE WITH INTRAOPERATIVE CHOLANGIOGRAM N/A 09/09/2016   Procedure: LAPAROSCOPIC CHOLECYSTECTOMY  WITH INTRAOPERATIVE CHOLANGIOGRAM;  Surgeon: Karie Soda, MD;  Location: [facility];  Service: General;  Laterality: N/A;  . TUBAL LIGATION       OB History   No obstetric history on file.     Family History  Problem Relation Age of Onset  . Heart disease Mother 70  . Heart failure Mother   . Asthma Mother   . Emphysema Mother   . Alzheimer's disease Father 79  . Emphysema Brother   . Asthma Brother   . Colon cancer Neg Hx     Social History   Tobacco Use  . Smoking status: Current Every Day Smoker    Packs/day: 1.00    Years: 47.00    Pack years: 47.00    Types: Cigarettes  . Smokeless tobacco: Never Used  . Tobacco comment: 1/2 ppd 02/25/20   Vaping Use  . Vaping Use: Never used  Substance Use Topics  . Alcohol use: No    Alcohol/week: 0.0 standard drinks  . Drug use: No    Home Medications Prior to Admission medications   Medication Sig Start Date End Date Taking? Authorizing Provider  albuterol (VENTOLIN HFA) 108 (90 Base) MCG/ACT inhaler INHALE 2 PUFFS INTO THE LUNGS EVERY 6 HOURS AS NEEDED FOR  WHEEZING OR SHORTNESS OF BREATH Patient taking differently: Inhale 2 puffs into the lungs every 6 (six) hours as needed for wheezing or shortness of breath. 10/18/19   Hoy Register, MD  amLODipine (NORVASC) 2.5 MG tablet Take 1 tablet (2.5 mg total) by mouth daily. Patient not taking: Reported on 09/05/2020 01/16/20   Hoy Register, MD  doxycycline (VIBRA-TABS) 100 MG tablet Take 1 tablet (100 mg total) by mouth 2 (two) times daily. 09/05/20   Glenford Bayley, NP  fluticasone-salmeterol (ADVAIR HFA) [phone] MCG/ACT inhaler Inhale 2 puffs into the lungs 2 (two) times daily. 02/25/20   Hunsucker, Lesia Sago, MD   lisinopril (ZESTRIL) 10 MG tablet TAKE 1 TABLET(10 MG) BY MOUTH DAILY 07/08/20   Hoy Register, MD  Multiple Vitamin (MULTIVITAMIN WITH MINERALS) TABS tablet Take 1 tablet by mouth every other day.    [provider]  predniSONE (DELTASONE) 10 MG tablet Take 4 tabs po daily x 2 days; then 3 tabs for 2 days; then 2 tabs for 2 days; then 1 tab for 2 days 09/05/20   Glenford Bayley, NP    Allergies    Aspirin, Ciprofloxacin, Codeine, Eggs or egg-derived products, Fentanyl, Hydrocodone, Lactose intolerance (gi), Oxycodone, and Penicillins  Review of Systems   Review of Systems  Constitutional: Positive for fatigue. Negative for chills and fever.  HENT: Negative for congestion.   Eyes: Negative for visual disturbance.  Respiratory: Negative for chest tightness and shortness of breath.   Cardiovascular: Positive for chest pain. Negative for palpitations and leg swelling.  Gastrointestinal: Negative for abdominal pain, diarrhea and vomiting.  Genitourinary: Negative for dysuria.  Skin: Negative for rash.  Neurological: Negative for headaches.    Physical Exam Updated Vital Signs BP (!) 141/95   Pulse 70   Temp 98.2 F (36.8 C) (Oral)   Resp (!) 21   Ht 5\' 4"  (1.626 m)   Wt 68 kg   SpO2 96%   BMI 25.75 kg/m   Physical Exam Vitals and nursing note reviewed.  Constitutional:      General: She is not in acute distress.    Appearance: Normal appearance. She is not diaphoretic.  HENT:     Head: Normocephalic.     Mouth/Throat:     Mouth: Mucous membranes are moist.  Cardiovascular:     Rate and Rhythm: Normal rate.  Pulmonary:     Effort: Pulmonary effort is normal. No respiratory distress.     Breath sounds: Wheezing present.  Chest:     Chest wall: Tenderness present.  Abdominal:     Palpations: Abdomen is soft.     Tenderness: There is no abdominal tenderness.  Musculoskeletal:     Right lower leg: No edema.     Left lower leg: No edema.  Skin:    General:  Skin is warm.  Neurological:     Mental Status: She is alert and oriented to person, place, and time. Mental status is at baseline.  Psychiatric:        Mood and Affect: Mood normal.     ED Results / Procedures / Treatments   Labs (all labs ordered are listed, but only abnormal results are displayed) Labs Reviewed  BASIC METABOLIC PANEL - Abnormal; Notable for the following components:      Result Value   Glucose, Bld 115 (*)    BUN 6 (*)    All other components within normal limits  CBC - Abnormal; Notable for the following components:   RBC  5.23 (*)    HCT 46.3 (*)    All other components within normal limits  TROPONIN I (HIGH SENSITIVITY)  TROPONIN I (HIGH SENSITIVITY)    EKG EKG Interpretation  Date/Time:  Monday Oct 27 2020 13:49:56 EDT Ventricular Rate:  69 PR Interval:  138 QRS Duration: 66 QT Interval:  430 QTC Calculation: 460 R Axis:   80 Text Interpretation: Normal sinus rhythm Septal infarct , age undetermined Abnormal ECG unchanged from previous Confirmed by Coralee Pesa [phone]) on 10/27/2020 6:31:22 PM   Radiology DG Chest 2 View  Result Date: 10/27/2020 CLINICAL DATA:  Chest pain EXAM: CHEST - 2 VIEW COMPARISON:  10/12/2017 chest x-ray and 07/29/2020 chest CT. FINDINGS: The cardiac silhouette, mediastinal and hilar contours are within normal limits. Mild tortuosity and calcification of the thoracic aorta. Stable hyperinflation and emphysematous changes. No infiltrates, edema or effusions. Minimal streaky basilar scarring changes. No worrisome pulmonary lesions. The bony thorax is intact. IMPRESSION: No acute cardiopulmonary findings. Stable emphysematous changes. Electronically Signed   By: Rudie Meyer M.D.   On: 10/27/2020 15:38    Procedures Procedures   Medications Ordered in ED Medications  acetaminophen (TYLENOL) tablet 650 mg (has no administration in time range)  ipratropium-albuterol (DUONEB) 0.5-2.5 (3) MG/3ML nebulizer solution 3 mL (has  no administration in time range)    ED Course  I have reviewed the triage vital signs and the nursing notes.  Pertinent labs & imaging results that were available during my care of the patient were reviewed by me and considered in my medical decision making (see chart for details).    MDM Rules/Calculators/A&P                          60 year old female presents the emergency department with chest discomfort.  Vitals are stable on arrival.  She has diffuse wheezing on lung auscultation, chest pain is very reproducible to palpation.  EKG is unchanged for her.  Troponins are negative with delta of 1.  Blood work is otherwise unremarkable.  After breathing treatment and Tylenol she feels significantly improved.  Very low suspicion for ACS, low suspicion for PE, chest pain is musculoskeletal and reproducible.  Patient will be discharged and treated as an outpatient.  Discharge plan and strict return to ED precautions discussed, patient verbalizes understanding and agreement.  Final Clinical Impression(s) / ED Diagnoses Final diagnoses:  None    Rx / DC Orders ED Discharge Orders    None       Rozelle Logan, DO 10/27/20 2133

## 2020-10-27 NOTE — Discharge Instructions (Addendum)
You have been seen and discharged from the emergency department.  Follow-up with your primary provider for reevaluation and further care. Take home medications as prescribed. If you have any worsening symptoms or further concerns for your health please return to an emergency department for further evaluation. 

## 2020-10-28 LAB — SARS CORONAVIRUS 2 (TAT 6-24 HRS): SARS Coronavirus 2: NEGATIVE

## 2020-11-03 ENCOUNTER — Telehealth: Payer: Self-pay | Admitting: Family Medicine

## 2020-11-03 DIAGNOSIS — Z1231 Encounter for screening mammogram for malignant neoplasm of breast: Secondary | ICD-10-CM

## 2020-11-03 NOTE — Telephone Encounter (Signed)
Pt is calling to request a referral to gyn for itching and redness. Please advise Cb- [phone]

## 2020-11-03 NOTE — Telephone Encounter (Signed)
Pt requesting order for mammogram

## 2020-11-03 NOTE — Telephone Encounter (Signed)
Pt is requesting a referral for a mammogram. Please advise CB- [phone]

## 2020-11-03 NOTE — Telephone Encounter (Signed)
Pt called back states that she does not need a referral she is fine going to [place] where she went in 2015

## 2020-11-04 ENCOUNTER — Other Ambulatory Visit: Payer: Self-pay | Admitting: Family Medicine

## 2020-11-04 DIAGNOSIS — J439 Emphysema, unspecified: Secondary | ICD-10-CM

## 2020-11-04 NOTE — Telephone Encounter (Signed)
Done

## 2020-11-05 NOTE — Telephone Encounter (Signed)
Pt was called and informed that orders has been placed and to call and schedule appointment.

## 2020-11-13 ENCOUNTER — Other Ambulatory Visit (HOSPITAL_COMMUNITY)
Admission: RE | Admit: 2020-11-13 | Discharge: 2020-11-13 | Disposition: A | Discharge status: Home / Self Care | Payer: Medicare Other | Source: Ambulatory Visit | Attending: Obstetrics | Admitting: Obstetrics

## 2020-11-13 ENCOUNTER — Encounter: Payer: Self-pay | Admitting: Obstetrics

## 2020-11-13 ENCOUNTER — Other Ambulatory Visit: Payer: Self-pay

## 2020-11-13 ENCOUNTER — Ambulatory Visit (INDEPENDENT_AMBULATORY_CARE_PROVIDER_SITE_OTHER): Payer: Medicare Other | Admitting: Obstetrics

## 2020-11-13 VITALS — BP 174/104 | HR 90 | Ht 64.0 in | Wt 186.0 lb

## 2020-11-13 DIAGNOSIS — Z01419 Encounter for gynecological examination (general) (routine) without abnormal findings: Secondary | ICD-10-CM

## 2020-11-13 DIAGNOSIS — Z113 Encounter for screening for infections with a predominantly sexual mode of transmission: Secondary | ICD-10-CM

## 2020-11-13 DIAGNOSIS — Z1211 Encounter for screening for malignant neoplasm of colon: Secondary | ICD-10-CM

## 2020-11-13 DIAGNOSIS — R300 Dysuria: Secondary | ICD-10-CM

## 2020-11-13 DIAGNOSIS — N898 Other specified noninflammatory disorders of vagina: Secondary | ICD-10-CM

## 2020-11-13 DIAGNOSIS — Z1239 Encounter for other screening for malignant neoplasm of breast: Secondary | ICD-10-CM | POA: Diagnosis not present

## 2020-11-13 DIAGNOSIS — F17200 Nicotine dependence, unspecified, uncomplicated: Secondary | ICD-10-CM

## 2020-11-13 DIAGNOSIS — E2839 Other primary ovarian failure: Secondary | ICD-10-CM

## 2020-11-13 DIAGNOSIS — Z1151 Encounter for screening for human papillomavirus (HPV): Secondary | ICD-10-CM | POA: Insufficient documentation

## 2020-11-13 LAB — POCT URINALYSIS DIPSTICK
Bilirubin, UA: NEGATIVE
Glucose, UA: NEGATIVE
Ketones, UA: NEGATIVE
Nitrite, UA: NEGATIVE
Protein, UA: NEGATIVE
Spec Grav, UA: >=1.03 — AB (ref 1.010–1.025)
Urobilinogen, UA: 0.2 E.U./dL
pH, UA: 5 (ref 5.0–8.0)

## 2020-11-13 MED ORDER — NITROFURANTOIN MONOHYD MACRO 100 MG PO CAPS: 100.0000 mg | ORAL_CAPSULE | Freq: Two times a day (BID) | ORAL | 2 refills | Status: DC

## 2020-11-13 NOTE — Progress Notes (Signed)
NGYN pt presents for annual, all STD testing, and vaginal discomfort.  Pt reports dysuria and vaginal rash.  Normal pap 2016 per pt  Pt is planning to schedule mammogram.  Colonoscopy - "years ago"  Bone Density - "years ago"

## 2020-11-13 NOTE — Progress Notes (Signed)
Subjective:        Maria Oliver is a 60 y.o. female here for a routine exam.  Current complaints: Vaginal rash, frequency and burning with urination.  Personal health questionnaire:  Is patient Ashkenazi Jewish, have a family history of breast and/or ovarian cancer: no Is there a family history of uterine cancer diagnosed at age < 9, gastrointestinal cancer, urinary tract cancer, family member who is a Personnel officer syndrome-associated carrier: no Is the patient overweight and hypertensive, family history of diabetes, personal history of gestational diabetes, preeclampsia or PCOS: no Is patient over 70, have PCOS,  family history of premature CHD under age 95, diabetes, smoke, have hypertension or peripheral artery disease:  no At any time, has a partner hit, kicked or otherwise hurt or frightened you?: no Over the past 2 weeks, have you felt down, depressed or hopeless?: no Over the past 2 weeks, have you felt little interest or pleasure in doing things?:no   Gynecologic History No LMP recorded. Patient is postmenopausal. Contraception: post menopausal status Last Pap: 2019. Results were: normal Last mammogram: 2019. Results were: normal  Obstetric History OB History  No obstetric history on file.    Past Medical History:  Diagnosis Date  . Abdominal pain   . Anxiety   . Arthritis   . Asthma   . Chronic cholecystitis with calculus s/p lap cholecystectomy 09/09/2016 09/09/2016  . Complication of anesthesia    slow to arouse post operatively   . COPD (chronic obstructive pulmonary disease) (HCC)   . 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 Generalized headaches   . Hernia, inguinal, right   . Hypertension   . Leg swelling     Past Surgical History:  Procedure Laterality Date  . COLON SURGERY  2010   per patient "colon take down"  . COLOSTOMY  2010  . LAPAROSCOPIC CHOLECYSTECTOMY SINGLE SITE WITH INTRAOPERATIVE CHOLANGIOGRAM N/A  09/09/2016   Procedure: LAPAROSCOPIC CHOLECYSTECTOMY  WITH INTRAOPERATIVE CHOLANGIOGRAM;  Surgeon: Karie Soda, MD;  Location: [facility];  Service: General;  Laterality: N/A;  . TUBAL LIGATION       Current Outpatient Medications:  .  albuterol (VENTOLIN HFA) 108 (90 Base) MCG/ACT inhaler, INHALE 2 PUFFS INTO THE LUNGS EVERY 6 HOURS AS NEEDED FOR WHEEZING OR SHORTNESS OF BREATH, Disp: 8.5 g, Rfl: 0 .  lisinopril (ZESTRIL) 10 MG tablet, TAKE 1 TABLET(10 MG) BY MOUTH DAILY, Disp: 90 tablet, Rfl: 1 .  nitrofurantoin, macrocrystal-monohydrate, (MACROBID) 100 MG capsule, Take 1 capsule (100 mg total) by mouth 2 (two) times daily. 1 po BID x 7days, Disp: 14 capsule, Rfl: 2 .  amLODipine (NORVASC) 2.5 MG tablet, Take 1 tablet (2.5 mg total) by mouth daily. (Patient not taking: No sig reported), Disp: 90 tablet, Rfl: 1 .  doxycycline (VIBRA-TABS) 100 MG tablet, Take 1 tablet (100 mg total) by mouth 2 (two) times daily. (Patient not taking: Reported on 11/13/2020), Disp: 14 tablet, Rfl: 0 .  fluticasone-salmeterol (ADVAIR HFA) 115-21 MCG/ACT inhaler, Inhale 2 puffs into the lungs 2 (two) times daily. (Patient not taking: Reported on 11/13/2020), Disp: 1 each, Rfl: 12 .  Multiple Vitamin (MULTIVITAMIN WITH MINERALS) TABS tablet, Take 1 tablet by mouth every other day. (Patient not taking: Reported on 11/13/2020), Disp: , Rfl:  .  predniSONE (DELTASONE) 10 MG tablet, Take 4 tabs po daily x 2 days; then 3 tabs for 2 days; then 2 tabs for 2 days; then 1 tab for 2 days (Patient not  taking: Reported on 11/13/2020), Disp: 20 tablet, Rfl: 0 Allergies  Allergen Reactions  . Aspirin Other (See Comments)    REACTION: GI upset/Hernia   . Ciprofloxacin Other (See Comments)    Tendonitis, joint pain, and nausea.  "Interfered with her HCTZ" (also)  . Codeine Nausea And Vomiting and Other (See Comments)    (Also) GI upset/Hernia (CAN TOLERATE DILAUDID & MORPHINE)  . Eggs Or Egg-Derived Products Nausea Only    Stomach pains  .  Fentanyl Nausea Only    Tolerates Dilaudid or morphine much better  . Hydrocodone Nausea And Vomiting  . Lactose Intolerance (Gi) Nausea And Vomiting and Other (See Comments)    (Also) vertigo  . Oxycodone Nausea And Vomiting    Tolerates hydromorphone better  . Penicillins Rash    Has patient had a PCN reaction causing immediate rash, facial/tongue/throat swelling, SOB or lightheadedness with hypotension: Yes Has patient had a PCN reaction causing severe rash involving mucus membranes or skin necrosis: No Has patient had a PCN reaction that required hospitalization No Has patient had a PCN reaction occurring within the last 10 years: No If all of the above answers are "NO", then may proceed with Cephalosporin use.     Social History   Tobacco Use  . Smoking status: Current Every Day Smoker    Packs/day: 1.00    Years: 47.00    Pack years: 47.00    Types: Cigarettes  . Smokeless tobacco: Never Used  . Tobacco comment: 1/2 ppd 02/25/20   Substance Use Topics  . Alcohol use: No    Alcohol/week: 0.0 standard drinks    Family History  Problem Relation Age of Onset  . Heart disease Mother 23  . Heart failure Mother   . Asthma Mother   . Emphysema Mother   . Alzheimer's disease Father 28  . Emphysema Brother   . Asthma Brother   . Colon cancer Neg Hx       Review of Systems  Constitutional: negative for fatigue and weight loss Respiratory: negative for cough and wheezing Cardiovascular: negative for chest pain, fatigue and palpitations Gastrointestinal: negative for abdominal pain and change in bowel habits Musculoskeletal:negative for myalgias Neurological: negative for gait problems and tremors Behavioral/Psych: negative for abusive relationship, depression Endocrine: negative for temperature intolerance    Genitourinary: positive for vaginal discharge.  negative for abnormal menstrual periods, genital lesions, hot flashes, sexual problems  Integument/breast: negative  for breast lump, breast tenderness, nipple discharge and skin lesion(s)    Objective:       BP (!) 174/104   Pulse 90   Ht 5\' 4"  (1.626 m)   Wt 186 lb (84.4 kg)   BMI 31.93 kg/m  General:   alert and no distress  Skin:   no rash or abnormalities  Lungs:   clear to auscultation bilaterally  Heart:   regular rate and rhythm, S1, S2 normal, no murmur, click, rub or gallop  Breasts:   normal without suspicious masses, skin or nipple changes or axillary nodes  Abdomen:  normal findings: no organomegaly, soft, non-tender and no hernia  Pelvis:  External genitalia: normal general appearance Urinary system: urethral meatus normal and bladder without fullness, nontender Vaginal: normal without tenderness, induration or masses Cervix: normal appearance Adnexa: normal bimanual exam Uterus: anteverted and non-tender, normal size   Lab Review Urine pregnancy test Labs reviewed yes Radiologic studies reviewed yes  I have spent a total of 20 minutes of face-to-face time, excluding clinical staff time, reviewing  notes and preparing to see patient, ordering tests and/or medications, and counseling the patient.  Assessment:     1. Encounter for gynecological examination with Papanicolaou smear of cervix Rx: - Cytology - PAP( [place])  2. Vaginal discharge Rx: - Cervicovaginal ancillary only  3. Screen for STD (sexually transmitted disease) Rx: - Hepatitis B surface antigen - Hepatitis C antibody - HIV Antibody (routine testing w rflx) - RPR  4. Screening breast examination Rx: - MM Digital Screening; Future  5. Hypoestrogenism Rx: - DG BONE DENSITY (DXA); Future  6. Screening for colon cancer Rx: - Ambulatory referral to Gastroenterology  7. Dysuria Rx: - Urine Culture - POCT Urinalysis Dipstick - nitrofurantoin, macrocrystal-monohydrate, (MACROBID) 100 MG capsule; Take 1 capsule (100 mg total) by mouth 2 (two) times daily. 1 po BID x 7days  Dispense: 14 capsule;  Refill: 2  8. Tobacco dependence - cessation with the aid of medication and behavioriol modification recommended    Plan:    Education reviewed: calcium supplements, depression evaluation, low fat, low cholesterol diet, safe sex/STD prevention, self breast exams, smoking cessation and weight bearing exercise. Follow up in: 1 year.   Meds ordered this encounter  Medications  . nitrofurantoin, macrocrystal-monohydrate, (MACROBID) 100 MG capsule    Sig: Take 1 capsule (100 mg total) by mouth 2 (two) times daily. 1 po BID x 7days    Dispense:  14 capsule    Refill:  2   Orders Placed This Encounter  Procedures  . Urine Culture  . MM Digital Screening    Standing Status:   Future    Standing Expiration Date:   11/13/2021    Order Specific Question:   Reason for Exam (SYMPTOM  OR DIAGNOSIS REQUIRED)    Answer:   Screening    Order Specific Question:   Preferred imaging location?    Answer:   [facility]  . DG BONE DENSITY (DXA)    Standing Status:   Future    Standing Expiration Date:   11/13/2021    Order Specific Question:   Reason for Exam (SYMPTOM  OR DIAGNOSIS REQUIRED)    Answer:   Screening    Order Specific Question:   Preferred imaging location?    Answer:   [facility]  . Hepatitis B surface antigen  . Hepatitis C antibody  . HIV Antibody (routine testing w rflx)  . RPR  . Ambulatory referral to Gastroenterology    Referral Priority:   Routine    Referral Type:   Consultation    Referral Reason:   Specialty Services Required    Number of Visits Requested:   1  . POCT Urinalysis Dipstick    Brock Bad, MD 11/14/2020 2:16 PM

## 2020-11-14 ENCOUNTER — Encounter: Payer: Self-pay | Admitting: Obstetrics

## 2020-11-14 LAB — HIV ANTIBODY (ROUTINE TESTING W REFLEX): HIV Screen 4th Generation wRfx: NONREACTIVE

## 2020-11-14 LAB — RPR: RPR Ser Ql: NONREACTIVE

## 2020-11-14 LAB — HEPATITIS C ANTIBODY: Hep C Virus Ab: <0.1 s/co ratio (ref 0.0–0.9)

## 2020-11-14 LAB — HEPATITIS B SURFACE ANTIGEN: Hepatitis B Surface Ag: NEGATIVE

## 2020-11-15 LAB — URINE CULTURE

## 2020-11-17 ENCOUNTER — Other Ambulatory Visit: Payer: Self-pay

## 2020-11-17 ENCOUNTER — Ambulatory Visit (HOSPITAL_BASED_OUTPATIENT_CLINIC_OR_DEPARTMENT_OTHER)
Admission: RE | Admit: 2020-11-17 | Discharge: 2020-11-17 | Disposition: A | Discharge status: Home / Self Care | Payer: Medicare Other | Source: Ambulatory Visit | Attending: Obstetrics | Admitting: Obstetrics

## 2020-11-17 DIAGNOSIS — Z1231 Encounter for screening mammogram for malignant neoplasm of breast: Secondary | ICD-10-CM | POA: Insufficient documentation

## 2020-11-17 DIAGNOSIS — E2839 Other primary ovarian failure: Secondary | ICD-10-CM | POA: Insufficient documentation

## 2020-11-17 DIAGNOSIS — Z1239 Encounter for other screening for malignant neoplasm of breast: Secondary | ICD-10-CM

## 2020-11-17 LAB — CERVICOVAGINAL ANCILLARY ONLY
Bacterial Vaginitis (gardnerella): NEGATIVE
Candida Glabrata: POSITIVE — AB
Candida Vaginitis: NEGATIVE
Chlamydia: NEGATIVE
Comment: NEGATIVE
Comment: NEGATIVE
Comment: NEGATIVE
Comment: NEGATIVE
Comment: NEGATIVE
Comment: NORMAL
Neisseria Gonorrhea: NEGATIVE
Trichomonas: NEGATIVE

## 2020-11-18 ENCOUNTER — Other Ambulatory Visit: Payer: Self-pay | Admitting: Obstetrics

## 2020-11-18 DIAGNOSIS — B379 Candidiasis, unspecified: Secondary | ICD-10-CM

## 2020-11-18 LAB — CYTOLOGY - PAP
Comment: NEGATIVE
Diagnosis: NEGATIVE
High risk HPV: NEGATIVE

## 2020-11-18 MED ORDER — FLUCONAZOLE 150 MG PO TABS
150.0000 mg | ORAL_TABLET | Freq: Once | ORAL | 0 refills | Status: AC
Start: 2020-11-18 — End: 2020-11-18

## 2020-11-18 NOTE — Progress Notes (Signed)
Call patient to inform her of test results. No answer or voice mail. 

## 2020-11-26 ENCOUNTER — Telehealth: Payer: Self-pay

## 2020-11-26 NOTE — Telephone Encounter (Signed)
Please review patient bone density results.

## 2020-12-10 ENCOUNTER — Ambulatory Visit: Payer: Medicare Other | Attending: Physician Assistant | Admitting: Physician Assistant

## 2020-12-10 ENCOUNTER — Other Ambulatory Visit: Payer: Self-pay

## 2020-12-10 VITALS — BP 148/98 | HR 78 | Resp 16 | Wt 150.0 lb

## 2020-12-10 DIAGNOSIS — I10 Essential (primary) hypertension: Secondary | ICD-10-CM

## 2020-12-10 DIAGNOSIS — J432 Centrilobular emphysema: Secondary | ICD-10-CM

## 2020-12-10 DIAGNOSIS — L731 Pseudofolliculitis barbae: Secondary | ICD-10-CM

## 2020-12-10 DIAGNOSIS — R079 Chest pain, unspecified: Secondary | ICD-10-CM

## 2020-12-10 DIAGNOSIS — F419 Anxiety disorder, unspecified: Secondary | ICD-10-CM | POA: Diagnosis not present

## 2020-12-10 DIAGNOSIS — R739 Hyperglycemia, unspecified: Secondary | ICD-10-CM

## 2020-12-10 DIAGNOSIS — J439 Emphysema, unspecified: Secondary | ICD-10-CM

## 2020-12-10 DIAGNOSIS — Z09 Encounter for follow-up examination after completed treatment for conditions other than malignant neoplasm: Secondary | ICD-10-CM

## 2020-12-10 MED ORDER — SERTRALINE HCL 50 MG PO TABS: 50.0000 mg | ORAL_TABLET | Freq: Every day | ORAL | 3 refills | Status: DC

## 2020-12-10 MED ORDER — LISINOPRIL 20 MG PO TABS: 20.0000 mg | ORAL_TABLET | Freq: Every day | ORAL | 3 refills | Status: DC

## 2020-12-10 MED ORDER — ALBUTEROL SULFATE HFA 108 (90 BASE) MCG/ACT IN AERS: 2.0000 | INHALATION_SPRAY | Freq: Four times a day (QID) | RESPIRATORY_TRACT | 0 refills | Status: DC | PRN

## 2020-12-10 MED ORDER — ADVAIR HFA 115-21 MCG/ACT IN AERO: 2.0000 | INHALATION_SPRAY | Freq: Two times a day (BID) | RESPIRATORY_TRACT | 12 refills | Status: DC

## 2020-12-10 NOTE — Progress Notes (Signed)
Patient ID: Maria Oliver, female   DOB: 28-Dec-1960, 60 y.o.   MRN: [id number]     Maria Oliver, is a 60 y.o. female  [id number]  ION:[id number]  DOB - 10/22/1960  Subjective:  Chief Complaint and HPI: Maria Oliver is a 60 y.o. female here today for a follow up visit fter ED visit 5/16 for CP.  No further CP.  Says BP is "always high" and that it's "normal for her."  Does not always take her meds daily. No HA/CP  She admits to a lot of anxiety and worry.  She was on paxil and vistaril in the past.  She does not remember if it worked and only took it for about 6 months.  She denies SI/HI.  More anxiety than depression.    Also wants referral bc she feels she gets too many ingrown hairs and picks at her skin.    From ED A/P 10/27/2020: 60 year old female presents the emergency department with chest discomfort.  Vitals are stable on arrival.  She has diffuse wheezing on lung auscultation, chest pain is very reproducible to palpation.   EKG is unchanged for her.  Troponins are negative with delta of 1.  Blood work is otherwise unremarkable.  After breathing treatment and Tylenol she feels significantly improved.  Very low suspicion for ACS, low suspicion for PE, chest pain is musculoskeletal and reproducible.  Patient will be discharged and treated as an outpatient.  Discharge plan and strict return to ED precautions discussed, patient verbalizes understanding and agreement.  ED/Hospital notes reviewed.   Social History: Family history:  ROS:   Constitutional:  No f/c, No night sweats, No unexplained weight loss. EENT:  No vision changes, No blurry vision, No hearing changes. No mouth, throat, or ear problems.  Respiratory: No cough, No SOB Cardiac: No CP, no palpitations GI:  No abd pain, No N/V/D. GU: No Urinary s/sx Musculoskeletal: No joint pain Neuro: No headache, no dizziness, no motor weakness.  Skin: No rash Endocrine:  No polydipsia. No polyuria.  Psych: Denies SI/HI   + anxiety  No problems updated.  ALLERGIES: Allergies  Allergen Reactions   Aspirin Other (See Comments)    REACTION: GI upset/Hernia    Ciprofloxacin Other (See Comments)    Tendonitis, joint pain, and nausea.  "Interfered with her HCTZ" (also)   Codeine Nausea And Vomiting and Other (See Comments)    (Also) GI upset/Hernia (CAN TOLERATE DILAUDID & MORPHINE)   Eggs Or Egg-Derived Products Nausea Only    Stomach pains   Fentanyl Nausea Only    Tolerates Dilaudid or morphine much better   Hydrocodone Nausea And Vomiting   Lactose Intolerance (Gi) Nausea And Vomiting and Other (See Comments)    (Also) vertigo   Oxycodone Nausea And Vomiting    Tolerates hydromorphone better   Penicillins Rash    Has patient had a PCN reaction causing immediate rash, facial/tongue/throat swelling, SOB or lightheadedness with hypotension: Yes Has patient had a PCN reaction causing severe rash involving mucus membranes or skin necrosis: No Has patient had a PCN reaction that required hospitalization No Has patient had a PCN reaction occurring within the last 10 years: No If all of the above answers are "NO", then may proceed with Cephalosporin use.     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 Authorizing Provider  lisinopril (ZESTRIL) 20 MG tablet Take 1 tablet (20 mg total) by mouth daily. 12/10/20  Yes Anders Simmonds, PA-C  sertraline (ZOLOFT) 50 MG tablet Take  1 tablet (50 mg total) by mouth daily. 12/10/20  Yes Anders Simmonds, PA-C  albuterol (VENTOLIN HFA) 108 (90 Base) MCG/ACT inhaler Inhale 2 puffs into the lungs every 6 (six) hours as needed for wheezing or shortness of breath. 12/10/20   Anders Simmonds, PA-C  doxycycline (VIBRA-TABS) 100 MG tablet Take 1 tablet (100 mg total) by mouth 2 (two) times daily. Patient not taking: No sig reported 09/05/20   Glenford Bayley, NP  fluticasone-salmeterol (ADVAIR HFA) [phone] MCG/ACT inhaler Inhale 2 puffs into the lungs 2 (two) times daily. Patient not taking: No sig reported 02/25/20   Hunsucker, Lesia Sago, MD  Multiple Vitamin (MULTIVITAMIN WITH MINERALS) TABS tablet Take 1 tablet by mouth every other day. Patient not taking: No sig reported    [provider]     Objective:  EXAM:   Vitals:   12/10/20 1502  BP: (!) 148/98  Pulse: 78  Resp: 16  SpO2: 93%  Weight: 150 lb (68 kg)    General appearance : A&OX3. NAD. Non-toxic-appearing HEENT: Atraumatic and Normocephalic.  PERRLA. EOM intact.   Chest/Lungs:  Breathing-non-labored, Good air entry bilaterally, breath sounds normal without rales, rhonchi, or wheezing  CVS: S1 S2 regular, no murmurs, gallops, rubs  Extremities: Bilateral Lower Ext shows no edema, both legs are warm to touch with = pulse throughout Neurology:  CN II-XII grossly intact, Non focal.   Psych:  TP scattered. J/I fair. Pressured speech. Appropriate eye contact and anxious affect.  Skin:  No Rash  Data Review Lab Results  Component Value Date   HGBA1C 5.6 08/11/2015     Assessment & Plan   1. Hyperglycemia I have had a lengthy discussion and provided education about insulin resistance and the intake of too much sugar/refined carbohydrates.  I have advised the patient to work at a goal of eliminating sugary drinks, candy, desserts, sweets, refined sugars, processed foods, and white carbohydrates.  The patient expresses understanding.   - Hemoglobin  A1c  2. Essential hypertension Uncontrolled-increase dose lisinopril - lisinopril (ZESTRIL) 20 MG tablet; Take 1 tablet (20 mg total) by mouth daily.  Dispense: 90 tablet; Refill: 3  3. Chest pain, unspecified type resolved  4. Encounter for examination following treatment at hospital   5. Anxiety Will try meds-start with 1/2 tab daily for 1 week then increase to 1 daily - Vitamin D, 25-hydroxy - Thyroid Panel With TSH - sertraline (ZOLOFT) 50 MG tablet; Take 1 tablet (50 mg total) by mouth daily.  Dispense: 30 tablet; Refill: 3  6. Pulmonary emphysema, unspecified emphysema type (HCC) - albuterol (VENTOLIN HFA) 108 (90 Base) MCG/ACT inhaler; Inhale 2 puffs into the lungs every 6 (six) hours as needed for wheezing or shortness of breath.  Dispense: 8.5 g; Refill: 0  7. Ingrown hair I don't see any abnormalities of the skin today but will refer per her request.   -  Ambulatory referral to Dermatology   Patient have been counseled extensively about nutrition and exercise  Return in about 3 months (around 03/12/2021) for PCP;  chronic conditions.  The patient was given clear instructions to go to ER or return to medical center if symptoms don't improve, worsen or new problems develop. The patient verbalized understanding. The patient was told to call to get lab results if they haven't heard anything in the next week.     Georgian Co, PA-C [facility] [place], [place] [phone]   12/10/2020, 3:17 PM

## 2020-12-11 ENCOUNTER — Other Ambulatory Visit: Payer: Self-pay | Admitting: Physician Assistant

## 2020-12-11 LAB — HEMOGLOBIN A1C
Est. average glucose Bld gHb Est-mCnc: 114 mg/dL
Hgb A1c MFr Bld: 5.6 % (ref 4.8–5.6)

## 2020-12-11 LAB — THYROID PANEL WITH TSH
Free Thyroxine Index: 1.7 (ref 1.2–4.9)
T3 Uptake Ratio: 24 % (ref 24–39)
T4, Total: 7.1 ug/dL (ref 4.5–12.0)
TSH: 1.11 u[IU]/mL (ref 0.450–4.500)

## 2020-12-11 LAB — VITAMIN D 25 HYDROXY (VIT D DEFICIENCY, FRACTURES): Vit D, 25-Hydroxy: 24.4 ng/mL — ABNORMAL LOW (ref 30.0–100.0)

## 2020-12-11 MED ORDER — VITAMIN D (ERGOCALCIFEROL) 1.25 MG (50000 UNIT) PO CAPS: 50000.0000 [IU] | ORAL_CAPSULE | ORAL | 0 refills | Status: DC

## 2020-12-30 ENCOUNTER — Encounter: Payer: Medicare Other | Admitting: Family Medicine

## 2021-02-15 ENCOUNTER — Telehealth: Payer: Self-pay

## 2021-02-15 NOTE — Telephone Encounter (Signed)
Lmom for patient to ret call. 

## 2021-02-20 ENCOUNTER — Telehealth: Payer: Self-pay | Admitting: Primary Care

## 2021-02-20 NOTE — Telephone Encounter (Signed)
Pt aware of response per Beth  Nothing further needed

## 2021-02-20 NOTE — Telephone Encounter (Signed)
She needs to start taking mucinex 1200mg  twice a day. If not better needs visit

## 2021-02-20 NOTE — Telephone Encounter (Signed)
Call made to patient, confirmed DOB. Patient reports developing increased SOB, cough, and a headache about a day ago. She reports she thinks it has turned in Bronchitis. She reports she gets bronchitis frequently. Denies fevers, chills, or sweats, Home Covid test negative. She confirms she is using her Advair daily and albuterol prn. She has been using tylenol prn for headache but it provides only temporary relief. She reports having congestion in her chest. She can feel thick mucous in the back of her throat but she is unable to cough it up. Denies using any OTC for her cough.   EW please advise. Next available appt Tuesday of next week.

## 2021-02-21 ENCOUNTER — Ambulatory Visit (HOSPITAL_COMMUNITY)
Admission: EM | Admit: 2021-02-21 | Discharge: 2021-02-21 | Disposition: A | Discharge status: Home / Self Care | Payer: Medicare Other | Attending: Emergency Medicine | Admitting: Emergency Medicine

## 2021-02-21 ENCOUNTER — Other Ambulatory Visit: Payer: Self-pay

## 2021-02-21 ENCOUNTER — Encounter (HOSPITAL_COMMUNITY): Payer: Self-pay | Admitting: *Deleted

## 2021-02-21 DIAGNOSIS — U071 COVID-19: Secondary | ICD-10-CM | POA: Insufficient documentation

## 2021-02-21 DIAGNOSIS — B349 Viral infection, unspecified: Secondary | ICD-10-CM | POA: Diagnosis present

## 2021-02-21 MED ORDER — IBUPROFEN 800 MG PO TABS: 800.0000 mg | ORAL_TABLET | Freq: Three times a day (TID) | ORAL | 0 refills | Status: DC

## 2021-02-21 MED ORDER — GUAIFENESIN ER 600 MG PO TB12: 600.0000 mg | ORAL_TABLET | Freq: Two times a day (BID) | ORAL | 0 refills | Status: DC

## 2021-02-21 MED ORDER — PREDNISONE 20 MG PO TABS: 40.0000 mg | ORAL_TABLET | Freq: Every day | ORAL | 0 refills | Status: DC

## 2021-02-21 NOTE — Discharge Instructions (Addendum)
Covid test pending 24-48 hours, we will call you if positive  Starting tomorrow take prednisone with morning with food for 5 days   Continue use antibiotics, flonase and Mucinex  Can use ibuprofen 800 mg every 8 hours as needed along with tylenol   You can take Tylenol and/or Ibuprofen as needed for fever reduction and pain relief.   For cough: honey 1/2 to 1 teaspoon (you can dilute the honey in water or another fluid).  You can also use guaifenesin and dextromethorphan for cough. You can use a humidifier for chest congestion and cough.  If you don't have a humidifier, you can sit in the bathroom with the hot shower running.      For sore throat: try warm salt water gargles, cepacol lozenges, throat spray, warm tea or water with lemon/honey, popsicles or ice, or OTC cold relief medicine for throat discomfort.   For congestion: take a daily anti-histamine like Zyrtec, Claritin, and a oral decongestant, such as pseudoephedrine.  You can also use Flonase 1-2 sprays in each nostril daily.   It is important to stay hydrated: drink plenty of fluids (water, gatorade/powerade/pedialyte, juices, or teas) to keep your throat moisturized and help further relieve irritation/discomfort.

## 2021-02-21 NOTE — ED Triage Notes (Signed)
Pt reports generalized body aches ,HA and fever.

## 2021-02-22 ENCOUNTER — Encounter (HOSPITAL_COMMUNITY): Payer: Self-pay | Admitting: Internal Medicine

## 2021-02-22 ENCOUNTER — Emergency Department (HOSPITAL_COMMUNITY): Payer: Medicare Other

## 2021-02-22 ENCOUNTER — Observation Stay (HOSPITAL_COMMUNITY)
Admission: EM | Admit: 2021-02-22 | Discharge: 2021-02-24 | Disposition: A | Discharge status: Home / Self Care | Payer: Medicare Other | Attending: Internal Medicine | Admitting: Internal Medicine

## 2021-02-22 DIAGNOSIS — E860 Dehydration: Secondary | ICD-10-CM | POA: Insufficient documentation

## 2021-02-22 DIAGNOSIS — J439 Emphysema, unspecified: Secondary | ICD-10-CM | POA: Diagnosis present

## 2021-02-22 DIAGNOSIS — F17210 Nicotine dependence, cigarettes, uncomplicated: Secondary | ICD-10-CM | POA: Diagnosis not present

## 2021-02-22 DIAGNOSIS — U071 COVID-19: Principal | ICD-10-CM | POA: Insufficient documentation

## 2021-02-22 DIAGNOSIS — J449 Chronic obstructive pulmonary disease, unspecified: Secondary | ICD-10-CM | POA: Insufficient documentation

## 2021-02-22 DIAGNOSIS — I4581 Long QT syndrome: Secondary | ICD-10-CM | POA: Insufficient documentation

## 2021-02-22 DIAGNOSIS — N179 Acute kidney failure, unspecified: Secondary | ICD-10-CM | POA: Diagnosis not present

## 2021-02-22 DIAGNOSIS — I959 Hypotension, unspecified: Secondary | ICD-10-CM | POA: Insufficient documentation

## 2021-02-22 DIAGNOSIS — I10 Essential (primary) hypertension: Secondary | ICD-10-CM | POA: Diagnosis present

## 2021-02-22 DIAGNOSIS — R911 Solitary pulmonary nodule: Secondary | ICD-10-CM | POA: Diagnosis not present

## 2021-02-22 DIAGNOSIS — R55 Syncope and collapse: Secondary | ICD-10-CM

## 2021-02-22 DIAGNOSIS — R0602 Shortness of breath: Secondary | ICD-10-CM | POA: Insufficient documentation

## 2021-02-22 DIAGNOSIS — J45909 Unspecified asthma, uncomplicated: Secondary | ICD-10-CM | POA: Insufficient documentation

## 2021-02-22 DIAGNOSIS — J432 Centrilobular emphysema: Secondary | ICD-10-CM

## 2021-02-22 DIAGNOSIS — Z79899 Other long term (current) drug therapy: Secondary | ICD-10-CM | POA: Insufficient documentation

## 2021-02-22 DIAGNOSIS — R4020 Unspecified coma: Secondary | ICD-10-CM

## 2021-02-22 LAB — URINALYSIS, MICROSCOPIC (REFLEX): Squamous Epithelial / HPF: NONE SEEN (ref 0–5)

## 2021-02-22 LAB — TROPONIN I (HIGH SENSITIVITY)
Troponin I (High Sensitivity): 10 ng/L (ref <18)
Troponin I (High Sensitivity): 8 ng/L (ref <18)

## 2021-02-22 LAB — CBC WITH DIFFERENTIAL/PLATELET
Abs Immature Granulocytes: 0.04 10*3/uL (ref 0.00–0.07)
Basophils Absolute: 0 10*3/uL (ref 0.0–0.1)
Basophils Relative: 1 %
Eosinophils Absolute: 0 10*3/uL (ref 0.0–0.5)
Eosinophils Relative: 1 %
HCT: 50 % — ABNORMAL HIGH (ref 36.0–46.0)
Hemoglobin: 16.4 g/dL — ABNORMAL HIGH (ref 12.0–15.0)
Immature Granulocytes: 1 %
Lymphocytes Relative: 25 %
Lymphs Abs: 1.6 10*3/uL (ref 0.7–4.0)
MCH: 28.8 pg (ref 26.0–34.0)
MCHC: 32.8 g/dL (ref 30.0–36.0)
MCV: 87.7 fL (ref 80.0–100.0)
Monocytes Absolute: 0.8 10*3/uL (ref 0.1–1.0)
Monocytes Relative: 12 %
Neutro Abs: 4 10*3/uL (ref 1.7–7.7)
Neutrophils Relative %: 60 %
Platelets: 283 10*3/uL (ref 150–400)
RBC: 5.7 MIL/uL — ABNORMAL HIGH (ref 3.87–5.11)
RDW: 14 % (ref 11.5–15.5)
WBC: 6.5 10*3/uL (ref 4.0–10.5)
nRBC: 0 % (ref 0.0–0.2)

## 2021-02-22 LAB — TYPE AND SCREEN
ABO/RH(D): O POS
Antibody Screen: NEGATIVE

## 2021-02-22 LAB — COMPREHENSIVE METABOLIC PANEL
ALT: 25 U/L (ref 0–44)
AST: 28 U/L (ref 15–41)
Albumin: 4.1 g/dL (ref 3.5–5.0)
Alkaline Phosphatase: 70 U/L (ref 38–126)
Anion gap: 12 (ref 5–15)
BUN: 16 mg/dL (ref 8–23)
CO2: 22 mmol/L (ref 22–32)
Calcium: 9.1 mg/dL (ref 8.9–10.3)
Chloride: 104 mmol/L (ref 98–111)
Creatinine, Ser: 1.44 mg/dL — ABNORMAL HIGH (ref 0.44–1.00)
GFR, Estimated: 41 mL/min — ABNORMAL LOW (ref >60)
Glucose, Bld: 150 mg/dL — ABNORMAL HIGH (ref 70–99)
Potassium: 4.1 mmol/L (ref 3.5–5.1)
Sodium: 138 mmol/L (ref 135–145)
Total Bilirubin: 0.5 mg/dL (ref 0.3–1.2)
Total Protein: 7 g/dL (ref 6.5–8.1)

## 2021-02-22 LAB — RESP PANEL BY RT-PCR (FLU A&B, COVID) ARPGX2
Influenza A by PCR: NEGATIVE
Influenza B by PCR: NEGATIVE
SARS Coronavirus 2 by RT PCR: POSITIVE — AB

## 2021-02-22 LAB — SARS CORONAVIRUS 2 (TAT 6-24 HRS): SARS Coronavirus 2: POSITIVE — AB

## 2021-02-22 LAB — URINALYSIS, ROUTINE W REFLEX MICROSCOPIC
Bilirubin Urine: NEGATIVE
Glucose, UA: NEGATIVE mg/dL
Ketones, ur: NEGATIVE mg/dL
Leukocytes,Ua: NEGATIVE
Nitrite: NEGATIVE
Protein, ur: NEGATIVE mg/dL
Specific Gravity, Urine: <1.005 — ABNORMAL LOW (ref 1.005–1.030)
pH: 6 (ref 5.0–8.0)

## 2021-02-22 LAB — LACTIC ACID, PLASMA
Lactic Acid, Venous: 2 mmol/L (ref 0.5–1.9)
Lactic Acid, Venous: 1.6 mmol/L (ref 0.5–1.9)

## 2021-02-22 LAB — PROTIME-INR
INR: 1 (ref 0.8–1.2)
Prothrombin Time: 12.7 seconds (ref 11.4–15.2)

## 2021-02-22 LAB — LIPASE, BLOOD: Lipase: 27 U/L (ref 11–51)

## 2021-02-22 MED ORDER — ENOXAPARIN SODIUM 40 MG/0.4ML IJ SOSY
40.0000 mg | PREFILLED_SYRINGE | Freq: Every day | INTRAMUSCULAR | Status: DC
  Administered 2021-02-23 (×2): 40 mg via SUBCUTANEOUS
  Filled 2021-02-22 (×2): qty 0.4

## 2021-02-22 MED ORDER — LACTATED RINGERS IV SOLN: INTRAVENOUS | Status: AC

## 2021-02-22 MED ORDER — SODIUM CHLORIDE 0.9 % IV SOLN
100.0000 mg | Freq: Every day | INTRAVENOUS | Status: DC
  Administered 2021-02-23 – 2021-02-24 (×2): 100 mg via INTRAVENOUS
  Filled 2021-02-22: qty 20
  Filled 2021-02-22: qty 100

## 2021-02-22 MED ORDER — SERTRALINE HCL 50 MG PO TABS: 50.0000 mg | ORAL_TABLET | Freq: Every day | ORAL | Status: DC

## 2021-02-22 MED ORDER — HYDRALAZINE HCL 20 MG/ML IJ SOLN: 10.0000 mg | INTRAMUSCULAR | Status: DC | PRN

## 2021-02-22 MED ORDER — IOHEXOL 350 MG/ML SOLN
65.0000 mL | Freq: Once | INTRAVENOUS | Status: AC | PRN
  Administered 2021-02-22: 65 mL via INTRAVENOUS

## 2021-02-22 MED ORDER — SODIUM CHLORIDE 0.9 % IV SOLN
200.0000 mg | Freq: Once | INTRAVENOUS | Status: AC
  Administered 2021-02-22: 200 mg via INTRAVENOUS
  Filled 2021-02-22: qty 40

## 2021-02-22 MED ORDER — LACTATED RINGERS IV BOLUS
1000.0000 mL | Freq: Once | INTRAVENOUS | Status: AC
  Administered 2021-02-22: 1000 mL via INTRAVENOUS

## 2021-02-22 MED ORDER — ALBUTEROL SULFATE HFA 108 (90 BASE) MCG/ACT IN AERS
2.0000 | INHALATION_SPRAY | Freq: Four times a day (QID) | RESPIRATORY_TRACT | Status: DC | PRN
  Administered 2021-02-24: 2 via RESPIRATORY_TRACT
  Filled 2021-02-22: qty 6.7

## 2021-02-22 MED ORDER — MOMETASONE FURO-FORMOTEROL FUM 200-5 MCG/ACT IN AERO
2.0000 | INHALATION_SPRAY | Freq: Two times a day (BID) | RESPIRATORY_TRACT | Status: DC
  Administered 2021-02-23 – 2021-02-24 (×3): 2 via RESPIRATORY_TRACT
  Filled 2021-02-22 (×2): qty 8.8

## 2021-02-22 NOTE — ED Triage Notes (Signed)
Pt had syncopal event at home on the chair. Did not hit head, not on blood thinner. Pt c/o pain in the epigastric area. Nausea and constipation. Diagnosed with viral infection yesterday.

## 2021-02-22 NOTE — ED Provider Notes (Signed)
Emergency Department Provider Note   I have reviewed the triage vital signs and the nursing notes.   HISTORY  Chief Complaint Loss of Consciousness and Abdominal Pain   HPI Maria Oliver is a 60 y.o. female with PMH reviewed below presents to the ED for evaluation of syncope event.  Patient states she was seen yesterday at urgent care with flulike symptoms and ultimately discharged with ibuprofen and COVID test pending.  She states that she has some chronic abdominal pain which seem to worsen last night.  She is having pain in her epigastric region which feels like burning but also some pressure.  No radiation to the chest.  She is now feeling particularly short of breath.  She is managing her symptoms at home but denies taking any pain medications.  She was sitting with family when she had a syncope event which prompted ED evaluation.  In triage, patient was found to be hypotensive and somewhat diaphoretic and brought immediately back to the acute care area.  She has some mild radiation of pain symptoms into the abdomen and back area.  Denies fevers but was having flulike symptoms yesterday. No new medications.   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 09/09/2016   Generalized headaches    Hernia, inguinal, right    Hypertension    Leg swelling     Patient Active Problem List   Diagnosis Date Noted   Syncope 02/22/2021   COVID-19 virus infection 02/22/2021   ARF (acute renal failure) (HCC) 02/22/2021   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/07/2017   Pruritic rash 10/14/2016   Chronic narcotic use 09/09/2016   Onychomycosis of right great toe  10/13/2015   Osteoarthritis 08/11/2015   Chronic abdominal pain 05/22/2015   Chronic pain syndrome 05/22/2015   Vitamin D insufficiency 02/06/2015   Chronic knee pain 02/06/2015   Ventral hernia without obstruction or gangrene 12/31/2014   Anxiety 12/31/2014   COPD (chronic obstructive pulmonary disease) with emphysema (HCC)    Tobacco abuse 10/01/2009   Essential hypertension 10/01/2009    Past Surgical History:  Procedure Laterality Date   COLON SURGERY  2010   per patient "colon take down"   COLOSTOMY  2010   LAPAROSCOPIC CHOLECYSTECTOMY SINGLE SITE WITH INTRAOPERATIVE CHOLANGIOGRAM N/A 09/09/2016   Procedure: LAPAROSCOPIC CHOLECYSTECTOMY  WITH INTRAOPERATIVE CHOLANGIOGRAM;  Surgeon: Karie Soda, MD;  Location: [facility];  Service: General;  Laterality: N/A;   TUBAL LIGATION      Allergies Aspirin, Ciprofloxacin, Codeine, Eggs or egg-derived products, Fentanyl, Hydrocodone, Lactose intolerance (gi), Oxycodone, Penicillins, and Tape  Family History  Problem Relation Age of Onset   Heart disease Mother 70   Heart failure Mother    Asthma Mother    Emphysema Mother    Alzheimer's disease Father 55   Emphysema Brother    Asthma Brother    Colon cancer Neg Hx     Social History Social History   Tobacco Use   Smoking status: Every Day    Packs/day: 1.00    Years: 47.00    Pack years: 47.00    Types: Cigarettes   Smokeless tobacco: Never  Tobacco comments:    1/2 ppd 02/25/20   Vaping Use   Vaping Use: Never used  Substance Use Topics   Alcohol use: No    Alcohol/week: 0.0 standard drinks   Drug use: No    Review of Systems  Constitutional: No fever/chills. Positive flu like symptoms yesterday.  Eyes: No visual changes. ENT: No sore throat. Cardiovascular: Denies chest pain. Positive syncope.  Respiratory: Denies shortness of breath. Mild cough.  Gastrointestinal: No abdominal pain.  No nausea, no vomiting.  No diarrhea.  No constipation. Genitourinary:  Negative for dysuria. Musculoskeletal: Negative for back pain. Skin: Negative for rash. Neurological: Negative for headaches, focal weakness or numbness.  10-point ROS otherwise negative.  ____________________________________________   PHYSICAL EXAM:  VITAL SIGNS: ED Triage Vitals  Enc Vitals Group     BP 02/22/21 1524 (!) 79/54     Pulse Rate 02/22/21 1524 73     Resp 02/22/21 1524 (!) 24     Temp 02/22/21 1524 (!) 97.4 F (36.3 C)     Temp Source 02/22/21 1535 Oral     SpO2 02/22/21 1524 96 %     Weight 02/22/21 1538 150 lb (68 kg)     Height 02/22/21 1538 5\' 4"  (1.626 m)   Constitutional: Alert and oriented. Well appearing and in no acute distress. Eyes: Conjunctivae are normal.  Head: Atraumatic. Nose: No congestion/rhinnorhea. Mouth/Throat: Mucous membranes are dry.  Neck: No stridor.  Cardiovascular: Normal rate, regular rhythm. Good peripheral circulation. Grossly normal heart sounds.   Respiratory: Normal respiratory effort.  No retractions. Lungs CTAB. Gastrointestinal: Soft with mild epigastric tenderness. No peritoneal findings. No distention.  Musculoskeletal: No lower extremity tenderness nor edema. No gross deformities of extremities. Neurologic:  Normal speech and language. No gross focal neurologic deficits are appreciated.  Skin:  Skin is warm, dry and intact. No rash noted.  ____________________________________________   LABS (all labs ordered are listed, but only abnormal results are displayed)  Labs Reviewed  URINE CULTURE - Abnormal; Notable for the following components:      Result Value   Culture   (*)    Value: <10,000 COLONIES/mL INSIGNIFICANT GROWTH Performed at [facility], [address], [place], [place]    All other components within normal limits  RESP PANEL BY RT-PCR (FLU A&B, COVID) ARPGX2 - Abnormal; Notable for the following components:   SARS Coronavirus 2 by RT PCR POSITIVE (*)    All other components within  normal limits  COMPREHENSIVE METABOLIC PANEL - Abnormal; Notable for the following components:   Glucose, Bld 150 (*)    Creatinine, Ser 1.44 (*)    GFR, Estimated 41 (*)    All other components within normal limits  LACTIC ACID, PLASMA - Abnormal; Notable for the following components:   Lactic Acid, Venous 2.0 (*)    All other components within normal limits  CBC WITH DIFFERENTIAL/PLATELET - Abnormal; Notable for the following components:   RBC 5.70 (*)    Hemoglobin 16.4 (*)    HCT 50.0 (*)    All other components within normal limits  URINALYSIS, ROUTINE W REFLEX MICROSCOPIC - Abnormal; Notable for the following components:   Specific Gravity, Urine <1.005 (*)    Hgb urine dipstick TRACE (*)    All other components within normal limits  URINALYSIS, MICROSCOPIC (REFLEX) - Abnormal; Notable for the following components:   Bacteria, UA RARE (*)    All other components within normal limits  CBC - Abnormal; Notable  for the following components:   RBC 5.40 (*)    Hemoglobin 15.4 (*)    HCT 48.4 (*)    All other components within normal limits  COMPREHENSIVE METABOLIC PANEL - Abnormal; Notable for the following components:   Potassium 3.2 (*)    BUN 7 (*)    Calcium 8.7 (*)    Total Protein 5.8 (*)    Albumin 3.4 (*)    All other components within normal limits  BRAIN NATRIURETIC PEPTIDE - Abnormal; Notable for the following components:   B Natriuretic Peptide 165.0 (*)    All other components within normal limits  CULTURE, BLOOD (ROUTINE X 2)  CULTURE, BLOOD (ROUTINE X 2)  LIPASE, BLOOD  LACTIC ACID, PLASMA  PROTIME-INR  CREATININE, SERUM  D-DIMER, QUANTITATIVE  C-REACTIVE PROTEIN  D-DIMER, QUANTITATIVE  C-REACTIVE PROTEIN  PROCALCITONIN  MAGNESIUM  C-REACTIVE PROTEIN  CBC WITH DIFFERENTIAL/PLATELET  D-DIMER, QUANTITATIVE  PROCALCITONIN  CBC WITH DIFFERENTIAL/PLATELET  TYPE AND SCREEN  TROPONIN I (HIGH SENSITIVITY)  TROPONIN I (HIGH SENSITIVITY)  TROPONIN I (HIGH  SENSITIVITY)   ____________________________________________  EKG   EKG Interpretation  Date/Time:  Sunday February 22 2021 15:41:53 EDT Ventricular Rate:  68 PR Interval:  134 QRS Duration: 82 QT Interval:  471 QTC Calculation: 501 R Axis:   88 Text Interpretation: Sinus rhythm Borderline right axis deviation Nonspecific T abnormalities, lateral leads Prolonged QT interval Similar to prior Confirmed by Alona BeneLong, Daleon Willinger [phone]) on 02/22/2021 4:08:48 PM Also confirmed by Alona BeneLong, Aeralyn Barna [phone]), editor Harmon PierAnanthanarayana Viswanatha, Ashwini ([id number])  on 02/23/2021 8:48:02 AM        ____________________________________________  RADIOLOGY  CXR reviewed.   ____________________________________________   PROCEDURES  Procedure(s) performed:   Procedures  CRITICAL CARE Performed by: Maia PlanJoshua G Karlea Mckibbin Total critical care time: 35 minutes Critical care time was exclusive of separately billable procedures and treating other patients. Critical care was necessary to treat or prevent imminent or life-threatening deterioration. Critical care was time spent personally by me on the following activities: development of treatment plan with patient and/or surrogate as well as nursing, discussions with consultants, evaluation of patient's response to treatment, examination of patient, obtaining history from patient or surrogate, ordering and performing treatments and interventions, ordering and review of laboratory studies, ordering and review of radiographic studies, pulse oximetry and re-evaluation of patient's condition.  Alona BeneJoshua Harbert Fitterer, MD Emergency Medicine  ____________________________________________   INITIAL IMPRESSION / ASSESSMENT AND PLAN / ED COURSE  Pertinent labs & imaging results that were available during my care of the patient were reviewed by me and considered in my medical decision making (see chart for details).   Patient presents to the emergency department with syncope.  Initially  hypotensive in triage.  Blood pressure systolic 90s on my initial evaluation.  She has some epigastric pain.  Mental status is normal.  She does have some chronic abdominal pain but given vital sign abnormalities do plan for work-up including lactate, CT imaging, chest x-ray. No acute ischemic pain on EKG interpreted by me as above.   Differential diagnosis includes but is not exclusive to acute cholecystitis, intrathoracic causes for epigastric abdominal pain, gastritis, duodenitis, pancreatitis, small bowel or large bowel obstruction, abdominal aortic aneurysm, hernia, gastritis, etc.  Patient with brief NSVT run on telemetry here in the ED. No additional hypotension episodes or prolonged NSVT requiring intervention.   Discussed patient's case with Cardiology to request admission. Patient and family (if present) updated with plan. Care transferred to Cardiology service.  I reviewed all nursing  notes, vitals, pertinent old records, EKGs, labs, imaging (as available).   ____________________________________________  FINAL CLINICAL IMPRESSION(S) / ED DIAGNOSES  Final diagnoses:  LOC (loss of consciousness) (HCC)  Hypotension, unspecified hypotension type     MEDICATIONS GIVEN DURING THIS VISIT:  Medications  lactated ringers infusion (0 mLs Intravenous Stopped 02/24/21 0705)  lactated ringers bolus 1,000 mL (0 mLs Intravenous Stopped 02/22/21 1715)  lactated ringers bolus 1,000 mL (0 mLs Intravenous Stopped 02/22/21 1832)  iohexol (OMNIPAQUE) 350 MG/ML injection 65 mL (65 mLs Intravenous Contrast Given 02/22/21 2000)  remdesivir 200 mg in sodium chloride 0.9% 250 mL IVPB (0 mg Intravenous Stopped 02/22/21 2146)  magnesium sulfate IVPB 1 g 100 mL (0 g Intravenous Stopped 02/23/21 1249)  potassium chloride (KLOR-CON) packet 40 mEq (40 mEq Oral Given 02/24/21 1035)   Note:  This document was prepared using Dragon voice recognition software and may include unintentional dictation errors.  Alona Bene, MD, [facility] Emergency Medicine    Yovana Scogin, Arlyss Repress, MD 02/25/21 [phone]

## 2021-02-22 NOTE — ED Notes (Signed)
Pt placed on bedpan per request.

## 2021-02-22 NOTE — H&P (Signed)
History and Physical    Maria Oliver UJW:[id number] DOB: 07-Nov-1960 DOA: 02/22/2021  PCP: Hoy RegisterNewlin, Enobong, MD  Patient coming from: Home.  Chief Complaint: Loss of consciousness.  HPI: Maria Oliver is a 60 y.o. female with history of hypertension was brought to the ER after patient had brief episode of loss of consciousness.  Patient has been experiencing flulike symptoms with generalized body ache headache weakness for the last 2 to 3 days.  Had gone to the urgent care a day ago was prescribed prednisone which patient is yet to take.  Today while patient was at her son's home while sitting on the chair patient suddenly became diaphoretic weak and lost consciousness for about 5 minutes.  Did not have any incontinence of urine or bowel did not have any chest pain or shortness of breath.  EMS was called and patient was brought to the ER.  Patient states she did have 3 episodes of diarrhea prior to coming to the ER.  ED Course: On arrival patient blood pressure was 80 systolic lactic acid was 2 EKG shows normal sinus rhythm with prolonged QTC of 5 1 ms.  High sensitive troponins were negative.  Patient's blood pressure responded to fluids.  CT angiogram of the chest abdomen pelvis was done which was negative for anything acute.  Did show lung nodules.  Patient's COVID test came back positive.  Labs also show acute renal failure when compared to the one done in May 2022.  Patient was admitted for further observation hydration and also was started on remdesivir for COVID.  On my exam patient is mildly wheezing.  Review of Systems: As per HPI, rest all negative.   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 She has a 47.00 pack-year smoking history. She has never used smokeless tobacco. She reports that she does not drink alcohol and does not use drugs.  Allergies  Allergen Reactions   Aspirin Other (See Comments)    REACTION: GI upset/Hernia    Ciprofloxacin Other (See Comments)    Tendonitis, joint pain, and nausea.  "Interfered with her HCTZ" (also)   Codeine Nausea And Vomiting and Other (See Comments)    (Also) GI upset/Hernia (CAN TOLERATE DILAUDID & MORPHINE)   Eggs Or Egg-Derived Products Nausea Only    Stomach pains   Fentanyl Nausea Only    Tolerates Dilaudid or morphine much better   Hydrocodone Nausea And Vomiting   Lactose Intolerance (Gi) Nausea And Vomiting and Other (See Comments)    (Also) vertigo   Oxycodone Nausea And Vomiting  Tolerates hydromorphone better   Penicillins Rash    Has patient had a PCN reaction causing immediate rash, facial/tongue/throat swelling, SOB or lightheadedness with hypotension: Yes Has patient had a PCN reaction causing severe rash involving mucus membranes or skin necrosis: No Has patient had a PCN reaction that required hospitalization No Has patient had a PCN reaction occurring within the last 10 years: No If all of the above answers are "NO", then may proceed with Cephalosporin use.     Family History  Problem Relation Age of Onset   Heart disease Mother 1   Heart  failure Mother    Asthma Mother    Emphysema Mother    Alzheimer's disease Father 36   Emphysema Brother    Asthma Brother    Colon cancer Neg Hx     Prior to Admission medications   Medication Sig Start Date End Date Taking? Authorizing Provider  albuterol (VENTOLIN HFA) 108 (90 Base) MCG/ACT inhaler Inhale 2 puffs into the lungs every 6 (six) hours as needed for wheezing or shortness of breath. 12/10/20   Sharon Seller, Marzella Schlein, PA-C  doxycycline (VIBRA-TABS) 100 MG tablet Take 1 tablet (100 mg total) by mouth 2 (two) times daily. Patient not taking: No sig reported 09/05/20   Glenford Bayley, NP  fluticasone-salmeterol (ADVAIR HFA) [phone] MCG/ACT inhaler Inhale 2 puffs into the lungs 2 (two) times daily. 12/10/20   Anders Simmonds, PA-C  guaiFENesin (MUCINEX) 600 MG 12 hr tablet Take 1 tablet (600 mg total) by mouth 2 (two) times daily. 02/21/21   White, Elita Boone, NP  ibuprofen (ADVIL) 800 MG tablet Take 1 tablet (800 mg total) by mouth 3 (three) times daily. 02/21/21   White, Elita Boone, NP  lisinopril (ZESTRIL) 20 MG tablet Take 1 tablet (20 mg total) by mouth daily. 12/10/20   Anders Simmonds, PA-C  Multiple Vitamin (MULTIVITAMIN WITH MINERALS) TABS tablet Take 1 tablet by mouth every other day. Patient not taking: No sig reported    [provider]  predniSONE (DELTASONE) 20 MG tablet Take 2 tablets (40 mg total) by mouth daily. 02/21/21   White, Elita Boone, NP  sertraline (ZOLOFT) 50 MG tablet Take 1 tablet (50 mg total) by mouth daily. 12/10/20   Anders Simmonds, PA-C  Vitamin D, Ergocalciferol, (DRISDOL) 1.25 MG (50000 UNIT) CAPS capsule Take 1 capsule (50,000 Units total) by mouth every 7 (seven) days. 12/11/20   Anders Simmonds, PA-C    Physical Exam: Constitutional: Moderately built and nourished. Vitals:   02/22/21 1800 02/22/21 1815 02/22/21 1915 02/22/21 2030  BP: 131/72 132/73 131/75 126/66  Pulse: 65 65 64 65  Resp: 16 17 13 15   Temp:      TempSrc:       SpO2: 96% 97% 97% 99%  Weight:      Height:       Eyes: Anicteric no pallor. ENMT: No discharge from the ears eyes nose and mouth. Neck: No mass felt.  No neck rigidity. Respiratory: Mild expiratory wheeze and no crepitations. Cardiovascular: S1-S2 heard. Abdomen: Soft nontender bowel sound present. Musculoskeletal: No edema. Skin: No rash. Neurologic: Alert awake oriented to time place and person.  Moves all extremities. Psychiatric: Appears normal.  Normal affect.   Labs on Admission: I have personally reviewed following labs and imaging studies  CBC: Recent Labs  Lab 02/22/21 1543  WBC 6.5  NEUTROABS 4.0  HGB 16.4*  HCT 50.0*  MCV 87.7  PLT 283   Basic Metabolic  Panel: Recent Labs  Lab 02/22/21 1543  NA 138  K 4.1  CL 104  CO2 22  GLUCOSE 150*  BUN 16  CREATININE 1.44*  CALCIUM 9.1   GFR: Estimated Creatinine Clearance: 38.4 mL/min (A) (by C-G formula based on SCr of 1.44 mg/dL (H)). Liver Function Tests: Recent Labs  Lab 02/22/21 1543  AST 28  ALT 25  ALKPHOS 70  BILITOT 0.5  PROT 7.0  ALBUMIN 4.1   Recent Labs  Lab 02/22/21 1543  LIPASE 27   No results for input(s): AMMONIA in the last 168 hours. Coagulation Profile: Recent Labs  Lab 02/22/21 1543  INR 1.0   Cardiac Enzymes: No results for input(s): CKTOTAL, CKMB, CKMBINDEX, TROPONINI in the last 168 hours. BNP (last 3 results) No results for input(s): PROBNP in the last 8760 hours. HbA1C: No results for input(s): HGBA1C in the last 72 hours. CBG: No results for input(s): GLUCAP in the last 168 hours. Lipid Profile: No results for input(s): CHOL, HDL, LDLCALC, TRIG, CHOLHDL, LDLDIRECT in the last 72 hours. Thyroid Function Tests: No results for input(s): TSH, T4TOTAL, FREET4, T3FREE, THYROIDAB in the last 72 hours. Anemia Panel: No results for input(s): VITAMINB12, FOLATE, FERRITIN, TIBC, IRON, RETICCTPCT in the last 72 hours. Urine analysis:    Component Value Date/Time    COLORURINE YELLOW 02/22/2021 1543   APPEARANCEUR CLEAR 02/22/2021 1543   LABSPEC <1.005 (L) 02/22/2021 1543   PHURINE 6.0 02/22/2021 1543   GLUCOSEU NEGATIVE 02/22/2021 1543   HGBUR TRACE (A) 02/22/2021 1543   BILIRUBINUR NEGATIVE 02/22/2021 1543   BILIRUBINUR neg 11/13/2020 1648   KETONESUR NEGATIVE 02/22/2021 1543   PROTEINUR NEGATIVE 02/22/2021 1543   UROBILINOGEN 0.2 11/13/2020 1648   UROBILINOGEN 0.2 07/09/2019 1829   NITRITE NEGATIVE 02/22/2021 1543   LEUKOCYTESUR NEGATIVE 02/22/2021 1543   Sepsis Labs: (procalcitonin:4,lacticidven:4) ) Recent Results (from the past 240 hour(s))  SARS CORONAVIRUS 2 (TAT 6-24 HRS) Nasopharyngeal Nasopharyngeal Swab     Status: Abnormal   Collection Time: 02/21/21  6:18 PM   Specimen: Nasopharyngeal Swab  Result Value Ref Range Status   SARS Coronavirus 2 POSITIVE (A) NEGATIVE Final    Comment: (NOTE) SARS-CoV-2 target nucleic acids are DETECTED.  The SARS-CoV-2 RNA is generally detectable in upper and lower respiratory specimens during the acute phase of infection. Positive results are indicative of the presence of SARS-CoV-2 RNA. Clinical correlation with patient history and other diagnostic information is  necessary to determine patient infection status. Positive results do not rule out bacterial infection or co-infection with other viruses.  The expected result is Negative.  Fact Sheet for Patients: HairSlick.no  Fact Sheet for Healthcare Providers: quierodirigir.com  This test is not yet approved or cleared by the [place] FDA and  has been authorized for detection and/or diagnosis of SARS-CoV-2 by FDA under an Emergency Use Authorization (EUA). This EUA will remain  in effect (meaning this test can be used) for the duration of the COVID-19 declaration under Section 564(b)(1) of the Act, 21 U. S.C. section 360bbb-3(b)(1), unless the authorization is terminated  or revoked sooner.   Performed at [facility], [address] [place], [place] [address]   Resp Panel by RT-PCR (Flu A&B, Covid) Nasopharyngeal Swab     Status: Abnormal   Collection Time: 02/22/21  4:00 PM   Specimen: Nasopharyngeal Swab; Nasopharyngeal(NP) swabs in vial transport medium  Result Value Ref Range Status   SARS Coronavirus 2 by RT PCR POSITIVE (A) NEGATIVE Final  Comment: RESULT CALLED TO, READ BACK BY AND VERIFIED WITH: S,WIRTZ @1929  02/22/21 EB (NOTE) SARS-CoV-2 target nucleic acids are DETECTED.  The SARS-CoV-2 RNA is generally detectable in upper respiratory specimens during the acute phase of infection. Positive results are indicative of the presence of the identified virus, but do not rule out bacterial infection or co-infection with other pathogens not detected by the test. Clinical correlation with patient history and other diagnostic information is necessary to determine patient infection status. The expected result is Negative.  Fact Sheet for Patients: 04/24/21  Fact Sheet for Healthcare Providers: BloggerCourse.com  This test is not yet approved or cleared by the [place] FDA and  has been authorized for detection and/or diagnosis of SARS-CoV-2 by FDA under an Emergency Use Authorization (EUA).  This EUA will remain in effect (meaning this test can be used) fo r the duration of  the COVID-19 declaration under Section 564(b)(1) of the Act, 21 U.S.C. section 360bbb-3(b)(1), unless the authorization is terminated or revoked sooner.     Influenza A by PCR NEGATIVE NEGATIVE Final   Influenza B by PCR NEGATIVE NEGATIVE Final    Comment: (NOTE) The Xpert Xpress SARS-CoV-2/FLU/RSV plus assay is intended as an aid in the diagnosis of influenza from Nasopharyngeal swab specimens and should not be used as a sole basis for treatment. Nasal washings and aspirates are unacceptable for  Xpert Xpress SARS-CoV-2/FLU/RSV testing.  Fact Sheet for Patients: [place]  Fact Sheet for Healthcare Providers: BloggerCourse.com  This test is not yet approved or cleared by the [place] FDA and has been authorized for detection and/or diagnosis of SARS-CoV-2 by FDA under an Emergency Use Authorization (EUA). This EUA will remain in effect (meaning this test can be used) for the duration of the COVID-19 declaration under Section 564(b)(1) of the Act, 21 U.S.C. section 360bbb-3(b)(1), unless the authorization is terminated or revoked.  Performed at [facility], [address] [place], [place]      Radiological Exams on Admission: DG Chest Portable 1 View  Result Date: 02/22/2021 CLINICAL DATA:  Chest pain and COPD with syncope. EXAM: PORTABLE CHEST 1 VIEW COMPARISON:  Oct 27, 2020. FINDINGS: EKG leads project over the chest. Image rotated slightly to the RIGHT, accounting for this cardiomediastinal contours are stable. Subtle opacity at the LEFT lung base likely in the lingula. No sign of pneumothorax. No sign of effusion on frontal view. On limited assessment no acute skeletal process. IMPRESSION: Subtle lingular opacity may represent atelectasis or developing infection. No lobar consolidation or effusion. Electronically Signed   By: Oct 29, 2020 M.D.   On: 02/22/2021 16:35   CT Angio Chest/Abd/Pel for Dissection W and/or Wo Contrast  Result Date: 02/22/2021 CLINICAL DATA:  Abdominal pain, nausea/vomiting, COVID positive EXAM: CT ANGIOGRAPHY CHEST, ABDOMEN AND PELVIS TECHNIQUE: Non-contrast CT of the chest was initially obtained. Multidetector CT imaging through the chest, abdomen and pelvis was performed using the standard protocol during bolus administration of intravenous contrast. Multiplanar reconstructed images and MIPs were obtained and reviewed to evaluate the vascular anatomy. CONTRAST:  20mL  OMNIPAQUE IOHEXOL 350 MG/ML SOLN COMPARISON:  Low-dose lung cancer screening CT chest dated 07/29/2020. CT abdomen/pelvis dated 01/10/2020. FINDINGS: CTA CHEST FINDINGS Cardiovascular: On unenhanced CT, there is no evidence of intramural hematoma. Following contrast administration, with preferential opacification of the thoracic aorta, there is no evidence of thoracic aortic aneurysm or dissection. Atherosclerotic calcifications of the aortic arch. Although not tailored for evaluation of the pulmonary arteries, there is  no evidence of central pulmonary embolism. Heart is normal in size.  Trace anterior pericardial fluid. Mediastinum/Nodes: No suspicious mediastinal lymphadenopathy. 8 mm right thyroid nodule (series 6/image 10), benign. Not clinically significant; no follow-up imaging recommended (ref: J Am Coll Radiol. 2015 Feb;12(2): 143-50). Lungs/Pleura: Mild centrilobular and paraseptal emphysematous changes, upper lung predominant. Scarring/atelectasis in the lingula and bilateral lower lobes. No focal consolidation despite the clinical history of COVID. Dominant 15 mm ground-glass nodule in the posterior right upper lobe (series 7/image 48), unchanged (previously 16 mm when measured in a similar fashion. Additional scattered ground-glass nodules are unchanged and better evaluated on low-dose lung cancer screening. No pleural effusion or pneumothorax. Musculoskeletal: Visualized osseous structures are within normal limits. Review of the MIP images confirms the above findings. CTA ABDOMEN AND PELVIS FINDINGS VASCULAR Aorta: No evidence of abdominal aortic aneurysm or dissection. Patent. Atherosclerotic calcifications. Celiac: Patent. SMA: Patent. Renals: Patent bilaterally. IMA: Patent. Inflow: Patent.  Atherosclerotic calcifications. Veins: Grossly unremarkable. Review of the MIP images confirms the above findings. NON-VASCULAR Hepatobiliary: Mildly nodular hepatic contour. No focal hepatic lesion. Status post  cholecystectomy. No intrahepatic or extrahepatic ductal dilatation. Pancreas: Within normal limits Spleen: Within normal limits. Adrenals/Urinary Tract: Adrenal glands are within normal limits. 16 mm interpolar left renal cyst (series 6/image 159). Right kidney is within normal limits. No renal calculi or hydronephrosis. Bladder is underdistended but grossly unremarkable. Stomach/Bowel: Stomach is within normal limits. No evidence of bowel obstruction. Appendix is not discretely visualized. No colonic wall thickening or inflammatory changes. Lymphatic: No suspicious abdominopelvic lymphadenopathy. Reproductive: Uterus and bilateral ovaries are unremarkable. Other: No abdominopelvic ascites. Large lower abdominal midline ventral hernia containing multiple loops of nondilated small bowel as well as a small segment of sigmoid colon. No associated wall thickening, inflammatory changes, or fluid. Musculoskeletal: Visualized osseous structures are within normal limits. Review of the MIP images confirms the above findings. IMPRESSION: No evidence of thoracoabdominal aortic aneurysm or dissection. Stable ground-glass nodules in the lungs bilaterally, measuring up to 15 mm in the posterior right upper lobe. Continued attention on follow-up is suggested at the time of patient's low-dose lung cancer screening. Large lower midline ventral hernia containing multiple loops of nondilated small bowel and sigmoid colon, without associated inflammatory changes. No evidence of bowel obstruction. Additional stable ancillary findings as above. Electronically Signed   By: Charline Bills M.D.   On: 02/22/2021 20:19    EKG: Independently reviewed.  Normal sinus rhythm prolonged QTC 501 ms.  Assessment/Plan Principal Problem:   Syncope Active Problems:   Essential hypertension   COPD (chronic obstructive pulmonary disease) with emphysema (HCC)   COVID-19 virus infection   ARF (acute renal failure) (HCC)    Syncope likely  from dehydration and was hypotensive.  Dehydration could be from the diarrhea.  Patient also has been having poor oral intake since patient has benign viral syndrome.  Gently hydrate follow metabolic panel and blood pressure trends holding antihypertensives.  Cardiac markers were negative.  Check 2D echo. COVID-19 infection positive but patient is not hypoxic CT scan of the chest does not show anything acute.  Patient has been empirically started on remdesivir.  Closely observe respiratory status and inflammatory markers. Acute bronchitis likely from COVID infection happens to be wheezing on my exam.  We will keep patient on inhalers and steroids. Acute renal failure likely from poor oral intake and diarrhea.  Holding lisinopril due to acute renal failure.  Gently hydrating.  Follow metabolic panel. Hypertension holding antihypertensives due to hypotensive  at presentation.  As needed IV hydralazine. Prolonged QTC avoid QTC prolonging medications. Lung nodule seen in the CAT scan will need follow-up as outpatient.   DVT prophylaxis: Lovenox. Code Status: Full code. Family Communication: Discussed with patient. Disposition Plan: Home. Consults called: None. Admission status: Observation.   Eduard Clos MD [facility] Pager [phone].  If 7PM-7AM, please contact night-coverage [email or web address] Password [facility]  02/22/2021, 9:28 PM

## 2021-02-22 NOTE — ED Provider Notes (Signed)
[facility]    CSN: [id number] Arrival date & time: 02/21/21  1749      History   Chief Complaint Chief Complaint  Patient presents with   Generalized Body Aches   Fatigue    HPI Maria Oliver is a 60 y.o. female.   Patient presents with generalized body aches, intermittent generalized headache, fevers, chills, sore throat, increased shortness of breath, productive cough and wheezing from baseline, nasal congestion rhinorrhea for 2 days.  Tolerating food and liquids.  Denies ear pain or fullness, abdominal pain, nausea, vomiting, diarrhea.  Took home COVID test, negative, patient endorses that the test was expired, requesting retest.  History of COPD, asthma, hypertension, chronic pain syndrome, every day smoker.  No known sick contact.  Vaccinated.  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 09/09/2016   Generalized headaches    Hernia, inguinal, right    Hypertension    Leg swelling     Patient Active Problem List   Diagnosis Date Noted   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/07/2017   Pruritic rash 10/14/2016   Chronic narcotic use 09/09/2016   Onychomycosis of right great toe 10/13/2015   Osteoarthritis 08/11/2015   Chronic abdominal pain 05/22/2015   Chronic pain syndrome 05/22/2015   Vitamin D insufficiency 02/06/2015   Chronic knee pain 02/06/2015   Ventral hernia without obstruction or gangrene 12/31/2014   Anxiety 12/31/2014   COPD (chronic obstructive pulmonary disease) with emphysema (HCC)    Tobacco abuse 10/01/2009   Essential hypertension 10/01/2009    Past Surgical History:  Procedure Laterality Date   COLON  SURGERY  2010   per patient "colon take down"   COLOSTOMY  2010   LAPAROSCOPIC CHOLECYSTECTOMY SINGLE SITE WITH INTRAOPERATIVE CHOLANGIOGRAM N/A 09/09/2016   Procedure: LAPAROSCOPIC CHOLECYSTECTOMY  WITH INTRAOPERATIVE CHOLANGIOGRAM;  Surgeon: Karie Soda, MD;  Location: [facility];  Service: General;  Laterality: N/A;   TUBAL LIGATION      OB History   No obstetric history on file.      Home Medications    Prior to Admission medications   Medication Sig Start Date End Date Taking? Authorizing Provider  guaiFENesin (MUCINEX) 600 MG 12 hr tablet Take 1 tablet (600 mg total) by mouth 2 (two) times daily. 02/21/21  Yes Norris Brumbach R, NP  ibuprofen (ADVIL) 800 MG tablet Take 1 tablet (800 mg total) by mouth 3 (three) times daily. 02/21/21  Yes Morgan Rennert R, NP  predniSONE (DELTASONE) 20 MG tablet Take 2 tablets (40 mg total) by mouth daily. 02/21/21  Yes Yitzhak Awan, Elita Boone, NP  albuterol (VENTOLIN HFA) 108 (90 Base) MCG/ACT inhaler Inhale 2 puffs into the lungs every 6 (six) hours as needed for wheezing or shortness of breath. 12/10/20   Sharon Seller, Marzella Schlein, PA-C  doxycycline (VIBRA-TABS) 100 MG tablet Take 1 tablet (100 mg total) by mouth 2 (two) times daily. Patient not taking: No sig reported 09/05/20   Glenford Bayley, NP  fluticasone-salmeterol (ADVAIR HFA) [phone] MCG/ACT inhaler Inhale 2 puffs into the lungs 2 (two) times daily. 12/10/20   Anders Simmonds, PA-C  lisinopril (ZESTRIL) 20 MG tablet Take  1 tablet (20 mg total) by mouth daily. 12/10/20   Anders Simmonds, PA-C  Multiple Vitamin (MULTIVITAMIN WITH MINERALS) TABS tablet Take 1 tablet by mouth every other day. Patient not taking: No sig reported    [provider]  sertraline (ZOLOFT) 50 MG tablet Take 1 tablet (50 mg total) by mouth daily. 12/10/20   Anders Simmonds, PA-C  Vitamin D, Ergocalciferol, (DRISDOL) 1.25 MG (50000 UNIT) CAPS capsule Take 1 capsule (50,000 Units total) by mouth every 7 (seven) days.  12/11/20   Anders Simmonds, PA-C    Family History Family History  Problem Relation Age of Onset   Heart disease Mother 12   Heart failure Mother    Asthma Mother    Emphysema Mother    Alzheimer's disease Father 12   Emphysema Brother    Asthma Brother    Colon cancer Neg Hx     Social History Social History   Tobacco Use   Smoking status: Every Day    Packs/day: 1.00    Years: 47.00    Pack years: 47.00    Types: Cigarettes   Smokeless tobacco: Never   Tobacco comments:    1/2 ppd 02/25/20   Vaping Use   Vaping Use: Never used  Substance Use Topics   Alcohol use: No    Alcohol/week: 0.0 standard drinks   Drug use: No     Allergies   Aspirin, Ciprofloxacin, Codeine, Eggs or egg-derived products, Fentanyl, Hydrocodone, Lactose intolerance (gi), Oxycodone, and Penicillins   Review of Systems Review of Systems Deferred HPI   Physical Exam Triage Vital Signs ED Triage Vitals [02/21/21 1758]  Enc Vitals Group     BP (!) 150/94     Pulse Rate 92     Resp 18     Temp 99 F (37.2 C)     Temp src      SpO2 97 %     Weight      Height      Head Circumference      Peak Flow      Pain Score 7     Pain Loc      Pain Edu?      Excl. in GC?    No data found.  Updated Vital Signs BP (!) 150/94   Pulse 92   Temp 99 F (37.2 C)   Resp 18   SpO2 97%   Visual Acuity Right Eye Distance:   Left Eye Distance:   Bilateral Distance:    Right Eye Near:   Left Eye Near:    Bilateral Near:     Physical Exam Constitutional:      Appearance: Normal appearance.  HENT:     Head: Normocephalic.     Right Ear: Ear canal and external ear normal. A middle ear effusion is present.     Left Ear: Ear canal and external ear normal. A middle ear effusion is present.     Nose: Congestion present. No rhinorrhea.     Mouth/Throat:     Mouth: Mucous membranes are moist.     Pharynx: Posterior oropharyngeal erythema present.  Eyes:     Extraocular Movements:  Extraocular movements intact.     Pupils: Pupils are equal, round, and reactive to light.  Cardiovascular:     Rate and Rhythm: Normal rate and regular rhythm.     Pulses: Normal pulses.     Heart sounds: Normal heart sounds.  Pulmonary:     Effort:  Pulmonary effort is normal.     Breath sounds: Normal breath sounds.  Abdominal:     General: Abdomen is flat. Bowel sounds are normal.     Palpations: Abdomen is soft.     Hernia: A hernia is present.  Musculoskeletal:     Cervical back: Normal range of motion.  Lymphadenopathy:     Cervical: Cervical adenopathy present.  Skin:    General: Skin is warm and dry.  Neurological:     Mental Status: She is alert and oriented to person, place, and time. Mental status is at baseline.  Psychiatric:        Mood and Affect: Mood normal.        Behavior: Behavior normal.     UC Treatments / Results  Labs (all labs ordered are listed, but only abnormal results are displayed) Labs Reviewed  SARS CORONAVIRUS 2 (TAT 6-24 HRS)    EKG   Radiology No results found.  Procedures Procedures (including critical care time)  Medications Ordered in UC Medications - No data to display  Initial Impression / Assessment and Plan / UC Course  I have reviewed the triage vital signs and the nursing notes.  Pertinent labs & imaging results that were available during my care of the patient were reviewed by me and considered in my medical decision making (see chart for details).  Viral illness  Vital signs stable with low-grade fever, patient is, low suspicion of pneumonia, possible COPD exacerbation in relation to viral illness, will treat conservatively for both lungs clear on examination, will defer chest x-ray at this time, discussed with patient, in agreement with plan of care  COVID test pending 2.  Prednisone 40 mg daily for 5 days 3.  Advised continued use of prescribed inhalers, Flonase and Mucinex for symptom management 4.  Ibuprofen 800  mg every 8 hours. 5.  Patient given strict return precautions for worsening signs of infection or breathing for return to urgent care nearest emergency department for evaluation Final Clinical Impressions(s) / UC Diagnoses   Final diagnoses:  Viral illness     Discharge Instructions      Covid test pending 24-48 hours, we will call you if positive  Starting tomorrow take prednisone with morning with food for 5 days   Continue use antibiotics, flonase and Mucinex  Can use ibuprofen 800 mg every 8 hours as needed along with tylenol   You can take Tylenol and/or Ibuprofen as needed for fever reduction and pain relief.   For cough: honey 1/2 to 1 teaspoon (you can dilute the honey in water or another fluid).  You can also use guaifenesin and dextromethorphan for cough. You can use a humidifier for chest congestion and cough.  If you don't have a humidifier, you can sit in the bathroom with the hot shower running.      For sore throat: try warm salt water gargles, cepacol lozenges, throat spray, warm tea or water with lemon/honey, popsicles or ice, or OTC cold relief medicine for throat discomfort.   For congestion: take a daily anti-histamine like Zyrtec, Claritin, and a oral decongestant, such as pseudoephedrine.  You can also use Flonase 1-2 sprays in each nostril daily.   It is important to stay hydrated: drink plenty of fluids (water, gatorade/powerade/pedialyte, juices, or teas) to keep your throat moisturized and help further relieve irritation/discomfort.     ED Prescriptions     Medication Sig Dispense Auth. Provider   predniSONE (DELTASONE) 20 MG tablet Take 2  tablets (40 mg total) by mouth daily. 10 tablet Teighlor Korson R, NP   ibuprofen (ADVIL) 800 MG tablet Take 1 tablet (800 mg total) by mouth 3 (three) times daily. 30 tablet Camay Pedigo R, NP   guaiFENesin (MUCINEX) 600 MG 12 hr tablet Take 1 tablet (600 mg total) by mouth 2 (two) times daily. 30 tablet Valinda HoarWhite,  Johnnell Liou R, NP      PDMP not reviewed this encounter.   Valinda HoarWhite, Qiara Minetti R, NP 02/22/21 1059

## 2021-02-22 NOTE — ED Triage Notes (Signed)
Pt was at her sons house dropping a gift off. She has not been feeling well the last 3 days. She states that she began to see things go black when she sat down and "passed out in a chair." Family was able to get her to the car and drive her here. She states she had another one of these "episodes" in the waiting room. Pt also c/o pain under her breast that is sharp and intermittent. Also has bilateral hip pain and back pain.

## 2021-02-23 ENCOUNTER — Observation Stay (HOSPITAL_BASED_OUTPATIENT_CLINIC_OR_DEPARTMENT_OTHER): Payer: Medicare Other

## 2021-02-23 DIAGNOSIS — U071 COVID-19: Secondary | ICD-10-CM | POA: Diagnosis not present

## 2021-02-23 DIAGNOSIS — N170 Acute kidney failure with tubular necrosis: Secondary | ICD-10-CM | POA: Diagnosis not present

## 2021-02-23 DIAGNOSIS — I10 Essential (primary) hypertension: Secondary | ICD-10-CM | POA: Diagnosis not present

## 2021-02-23 DIAGNOSIS — R55 Syncope and collapse: Secondary | ICD-10-CM | POA: Diagnosis not present

## 2021-02-23 DIAGNOSIS — I959 Hypotension, unspecified: Secondary | ICD-10-CM | POA: Diagnosis not present

## 2021-02-23 LAB — CREATININE, SERUM
Creatinine, Ser: 0.62 mg/dL (ref 0.44–1.00)
GFR, Estimated: >60 mL/min (ref >60)

## 2021-02-23 LAB — CBC
HCT: 48.4 % — ABNORMAL HIGH (ref 36.0–46.0)
Hemoglobin: 15.4 g/dL — ABNORMAL HIGH (ref 12.0–15.0)
MCH: 28.5 pg (ref 26.0–34.0)
MCHC: 31.8 g/dL (ref 30.0–36.0)
MCV: 89.6 fL (ref 80.0–100.0)
Platelets: 219 10*3/uL (ref 150–400)
RBC: 5.4 MIL/uL — ABNORMAL HIGH (ref 3.87–5.11)
RDW: 14.2 % (ref 11.5–15.5)
WBC: 5.1 10*3/uL (ref 4.0–10.5)
nRBC: 0 % (ref 0.0–0.2)

## 2021-02-23 LAB — C-REACTIVE PROTEIN
CRP: 0.6 mg/dL (ref <1.0)
CRP: <0.5 mg/dL (ref <1.0)

## 2021-02-23 LAB — ECHOCARDIOGRAM LIMITED
Area-P 1/2: 2.84 cm2
Height: 64 in
S' Lateral: 2.7 cm
Weight: 2400 oz

## 2021-02-23 LAB — D-DIMER, QUANTITATIVE
D-Dimer, Quant: 0.34 ug/mL-FEU (ref 0.00–0.50)
D-Dimer, Quant: 0.37 ug/mL-FEU (ref 0.00–0.50)

## 2021-02-23 LAB — TROPONIN I (HIGH SENSITIVITY): Troponin I (High Sensitivity): 11 ng/L (ref <18)

## 2021-02-23 LAB — PROCALCITONIN: Procalcitonin: <0.1 ng/mL

## 2021-02-23 MED ORDER — MAGNESIUM SULFATE IN D5W 1-5 GM/100ML-% IV SOLN
1.0000 g | Freq: Once | INTRAVENOUS | Status: AC
  Administered 2021-02-23: 1 g via INTRAVENOUS
  Filled 2021-02-23: qty 100

## 2021-02-23 MED ORDER — BOOST PO LIQD
237.0000 mL | ORAL | Status: DC
  Administered 2021-02-23: 237 mL via ORAL
  Filled 2021-02-23: qty 237

## 2021-02-23 MED ORDER — NICOTINE 21 MG/24HR TD PT24
21.0000 mg | MEDICATED_PATCH | TRANSDERMAL | Status: DC
  Administered 2021-02-23: 21 mg via TRANSDERMAL
  Filled 2021-02-23: qty 1

## 2021-02-23 MED ORDER — METHYLPREDNISOLONE SODIUM SUCC 40 MG IJ SOLR
40.0000 mg | Freq: Every day | INTRAMUSCULAR | Status: DC
  Administered 2021-02-23 – 2021-02-24 (×3): 40 mg via INTRAVENOUS
  Filled 2021-02-23 (×3): qty 1

## 2021-02-23 MED ORDER — ACETAMINOPHEN 500 MG PO TABS
500.0000 mg | ORAL_TABLET | Freq: Four times a day (QID) | ORAL | Status: DC | PRN
  Administered 2021-02-24: 500 mg via ORAL
  Filled 2021-02-23: qty 1

## 2021-02-23 MED ORDER — HYDRALAZINE HCL 20 MG/ML IJ SOLN: 10.0000 mg | Freq: Four times a day (QID) | INTRAMUSCULAR | Status: DC | PRN

## 2021-02-23 MED ORDER — LORAZEPAM 2 MG/ML IJ SOLN
0.5000 mg | Freq: Two times a day (BID) | INTRAMUSCULAR | Status: DC | PRN
Start: 2021-02-23 — End: 2021-02-24

## 2021-02-23 NOTE — Plan of Care (Signed)
  Problem: Health Behavior/Discharge Planning: Goal: Ability to manage health-related needs will improve Outcome: Progressing   Problem: Clinical Measurements: Goal: Ability to maintain clinical measurements within normal limits will improve Outcome: Progressing Goal: Will remain free from infection Outcome: Progressing Goal: Diagnostic test results will improve Outcome: Progressing Goal: Respiratory complications will improve Outcome: Progressing Goal: Cardiovascular complication will be avoided Outcome: Progressing   Problem: Nutrition: Goal: Adequate nutrition will be maintained Outcome: Progressing   Problem: Coping: Goal: Level of anxiety will decrease Outcome: Progressing   

## 2021-02-23 NOTE — Progress Notes (Signed)
PROGRESS NOTE                                                                                                                                                                                                             Patient Demographics:    Maria Oliver, is a 60 y.o. female, DOB - Aug 28, 1960, UEA:[id number]  Outpatient Primary MD for the patient is Hoy Register, MD    LOS - 0  Admit date - 02/22/2021    Chief Complaint  Patient presents with   Loss of Consciousness   Abdominal Pain       Brief Narrative (HPI from H&P)  Maria Oliver is a 60 y.o. female with history of hypertension was brought to the ER after patient had brief episode of loss of consciousness.  Patient has been experiencing flulike symptoms with generalized body ache headache weakness for the last 2 to 3 days, she is not vaccinated for COVID, at home while sitting she became weak and apparently lost consciousness for about 5 minutes while in the chair, she was brought to the ER, in the ER she was diagnosed with COVID infection, dehydration, AKI and hypotension and admitted to the hospital.   Subjective:    Maria Oliver today has, No headache, No chest pain, No abdominal pain - No Nausea, No new weakness tingling or numbness, no SOB   Assessment  & Plan :     Acute COVID infection in a patient who is not vaccinated causing dehydration, hypotension and syncope - she seems to have so far mild COVID infection, inflammatory markers are stable, she has been placed on steroids and IV remdesivir which I agree.  Continue short course.  Currently no pulmonary symptoms.  Dehydration and orthostasis improving after IV fluids, continue gentle hydration, monitor closely with advancing activity, PT OT eval.  Encouraged the patient to sit up in chair in the daytime use I-S and flutter valve for pulmonary toiletry.  Will advance activity and titrate down oxygen as  possible.   2.  Syncope with loss of consciousness.  As in #1 above.  Get echocardiogram, evaluate with increased activity and PT OT, no headache or focal deficits.  3.  Right lung nodule.  Outpatient follow-up with pulmonary 4 to 6 weeks post discharge, counseled to quit smoking.  4.  Smoking.  Counseled to  quit.  5.  AKI due to dehydration.  Hydrate and monitor.  6.  Mild QTC prolongation.  1 g of IV mag, monitor on telemetry.  Symptom-free.  Echo pending.   SpO2: 97 %  Recent Labs  Lab 02/21/21 1818 02/22/21 1543 02/22/21 1600 02/22/21 2028 02/23/21 0540 02/23/21 0747  WBC  --  6.5  --   --  5.1  --   HGB  --  16.4*  --   --  15.4*  --   HCT  --  50.0*  --   --  48.4*  --   PLT  --  283  --   --  219  --   CRP  --   --   --   --  0.6 <0.5  DDIMER  --   --   --   --  0.34 0.37  AST  --  28  --   --   --   --   ALT  --  25  --   --   --   --   ALKPHOS  --  70  --   --   --   --   BILITOT  --  0.5  --   --   --   --   ALBUMIN  --  4.1  --   --   --   --   INR  --  1.0  --   --   --   --   LATICACIDVEN  --  2.0*  --  1.6  --   --   SARSCOV2NAA POSITIVE*  --  POSITIVE*  --   --   --               Condition - Fair  Family Communication  :  None present  Code Status :  Full  Consults  :  None  PUD Prophylaxis :    Procedures  :     TTE-   CT - No evidence of thoracoabdominal aortic aneurysm or dissection. Stable ground-glass nodules in the lungs bilaterally, measuring up to 15 mm in the posterior right upper lobe. Continued attention on follow-up is suggested at the time of patient's low-dose lung cancer screening. Large lower midline ventral hernia containing multiple loops of nondilated small bowel and sigmoid colon, without associated inflammatory changes. No evidence of bowel obstruction.      Disposition Plan  :    Status is: Observation  Dispo: The patient is from: Home              Anticipated d/c is to: Home              Patient currently is  not medically stable to d/c.   Difficult to place patient No   DVT Prophylaxis  :    enoxaparin (LOVENOX) injection 40 mg Start: 02/22/21 2300     Lab Results  Component Value Date   PLT 219 02/23/2021    Diet :  Diet Order             Diet Heart Room service appropriate? Yes; Fluid consistency: Thin  Diet effective now                    Inpatient Medications  Scheduled Meds:  enoxaparin (LOVENOX) injection  40 mg Subcutaneous QHS   lactose free nutrition  237 mL Oral QODAY   methylPREDNISolone (SOLU-MEDROL) injection  40 mg Intravenous Daily   mometasone-formoterol  2 puff Inhalation BID   Continuous Infusions:  lactated ringers 50 mL/hr at 02/23/21 0741   remdesivir 100 mg in NS 100 mL     PRN Meds:.acetaminophen, albuterol, hydrALAZINE  Antibiotics  :    Anti-infectives (From admission, onward)    Start     Dose/Rate Route Frequency Ordered Stop   02/23/21 1000  remdesivir 100 mg in sodium chloride 0.9 % 100 mL IVPB       See Hyperspace for full Linked Orders Report.   100 mg 200 mL/hr over 30 Minutes Intravenous Daily 02/22/21 2032 02/27/21 0959   02/22/21 2045  remdesivir 200 mg in sodium chloride 0.9% 250 mL IVPB       See Hyperspace for full Linked Orders Report.   200 mg 580 mL/hr over 30 Minutes Intravenous Once 02/22/21 2032 02/22/21 2146        Time Spent in minutes  30   Susa Raring M.D on 02/23/2021 at 10:06 AM  To page go to [email or web address]   [facility] -  Office  [phone]    See all Orders from today for further details    Objective:   Vitals:   02/22/21 2030 02/22/21 2345 02/23/21 0200 02/23/21 0700  BP: 126/66 (!) 143/76 (!) 149/78 (!) 164/104  Pulse: 65 82 64 63  Resp: 15 14 15 15   Temp:   98.1 F (36.7 C)   TempSrc:   Axillary   SpO2: 99% 96% 95% 97%  Weight:      Height:        Wt Readings from Last 3 Encounters:  02/22/21 68 kg  12/10/20 68 kg  11/13/20 84.4 kg     Intake/Output Summary  (Last 24 hours) at 02/23/2021 1006 Last data filed at 02/22/2021 1715 Gross per 24 hour  Intake 1000 ml  Output --  Net 1000 ml     Physical Exam  Awake Alert, No new F.N deficits, Normal affect [place].AT,PERRAL Supple Neck,No JVD, No cervical lymphadenopathy appriciated.  Symmetrical Chest wall movement, Good air movement bilaterally, CTAB RRR,No Gallops,Rubs or new Murmurs, No Parasternal Heave +ve B.Sounds, Abd Soft, No tenderness, No organomegaly appriciated, No rebound - guarding or rigidity. No Cyanosis, Clubbing or edema, No new Rash or bruise      Data Review:    CBC Recent Labs  Lab 02/22/21 1543 02/23/21 0540  WBC 6.5 5.1  HGB 16.4* 15.4*  HCT 50.0* 48.4*  PLT 283 219  MCV 87.7 89.6  MCH 28.8 28.5  MCHC 32.8 31.8  RDW 14.0 14.2  LYMPHSABS 1.6  --   MONOABS 0.8  --   EOSABS 0.0  --   BASOSABS 0.0  --     Recent Labs  Lab 02/22/21 1543 02/22/21 2028 02/23/21 0540 02/23/21 0747  NA 138  --   --   --   K 4.1  --   --   --   CL 104  --   --   --   CO2 22  --   --   --   GLUCOSE 150*  --   --   --   BUN 16  --   --   --   CREATININE 1.44*  --  0.62  --   CALCIUM 9.1  --   --   --   AST 28  --   --   --   ALT 25  --   --   --   ALKPHOS 70  --   --   --  BILITOT 0.5  --   --   --   ALBUMIN 4.1  --   --   --   CRP  --   --  0.6 <0.5  DDIMER  --   --  0.34 0.37  LATICACIDVEN 2.0* 1.6  --   --   INR 1.0  --   --   --     ------------------------------------------------------------------------------------------------------------------ No results for input(s): CHOL, HDL, LDLCALC, TRIG, CHOLHDL, LDLDIRECT in the last 72 hours.  Lab Results  Component Value Date   HGBA1C 5.6 12/10/2020   ------------------------------------------------------------------------------------------------------------------ No results for input(s): TSH, T4TOTAL, T3FREE, THYROIDAB in the last 72 hours.  Invalid input(s): FREET3  Cardiac Enzymes No results for input(s):  CKMB, TROPONINI, MYOGLOBIN in the last 168 hours.  Invalid input(s): CK ------------------------------------------------------------------------------------------------------------------ No results found for: BNP  Micro Results Recent Results (from the past 240 hour(s))  SARS CORONAVIRUS 2 (TAT 6-24 HRS) Nasopharyngeal Nasopharyngeal Swab     Status: Abnormal   Collection Time: 02/21/21  6:18 PM   Specimen: Nasopharyngeal Swab  Result Value Ref Range Status   SARS Coronavirus 2 POSITIVE (A) NEGATIVE Final    Comment: (NOTE) SARS-CoV-2 target nucleic acids are DETECTED.  The SARS-CoV-2 RNA is generally detectable in upper and lower respiratory specimens during the acute phase of infection. Positive results are indicative of the presence of SARS-CoV-2 RNA. Clinical correlation with patient history and other diagnostic information is  necessary to determine patient infection status. Positive results do not rule out bacterial infection or co-infection with other viruses.  The expected result is Negative.  Fact Sheet for Patients: HairSlick.nohttps://www.fda.gov/media/138098/download  Fact Sheet for Healthcare Providers: quierodirigir.comhttps://www.fda.gov/media/138095/download  This test is not yet approved or cleared by the [place] FDA and  has been authorized for detection and/or diagnosis of SARS-CoV-2 by FDA under an Emergency Use Authorization (EUA). This EUA will remain  in effect (meaning this test can be used) for the duration of the COVID-19 declaration under Section 564(b)(1) of the Act, 21 U. S.C. section 360bbb-3(b)(1), unless the authorization is terminated or revoked sooner.   Performed at [facility], [address] [place], [place] [address]   Resp Panel by RT-PCR (Flu A&B, Covid) Nasopharyngeal Swab     Status: Abnormal   Collection Time: 02/22/21  4:00 PM   Specimen: Nasopharyngeal Swab; Nasopharyngeal(NP) swabs in vial transport medium  Result Value Ref Range Status    SARS Coronavirus 2 by RT PCR POSITIVE (A) NEGATIVE Final    Comment: RESULT CALLED TO, READ BACK BY AND VERIFIED WITH: S,WIRTZ @1929  02/22/21 EB (NOTE) SARS-CoV-2 target nucleic acids are DETECTED.  The SARS-CoV-2 RNA is generally detectable in upper respiratory specimens during the acute phase of infection. Positive results are indicative of the presence of the identified virus, but do not rule out bacterial infection or co-infection with other pathogens not detected by the test. Clinical correlation with patient history and other diagnostic information is necessary to determine patient infection status. The expected result is Negative.  Fact Sheet for Patients: BloggerCourse.comhttps://www.fda.gov/media/152166/download  Fact Sheet for Healthcare Providers: SeriousBroker.ithttps://www.fda.gov/media/152162/download  This test is not yet approved or cleared by the [place] FDA and  has been authorized for detection and/or diagnosis of SARS-CoV-2 by FDA under an Emergency Use Authorization (EUA).  This EUA will remain in effect (meaning this test can be used) fo r the duration of  the COVID-19 declaration under Section 564(b)(1) of the Act, 21 U.S.C. section 360bbb-3(b)(1), unless the authorization is terminated  or revoked sooner.     Influenza A by PCR NEGATIVE NEGATIVE Final   Influenza B by PCR NEGATIVE NEGATIVE Final    Comment: (NOTE) The Xpert Xpress SARS-CoV-2/FLU/RSV plus assay is intended as an aid in the diagnosis of influenza from Nasopharyngeal swab specimens and should not be used as a sole basis for treatment. Nasal washings and aspirates are unacceptable for Xpert Xpress SARS-CoV-2/FLU/RSV testing.  Fact Sheet for Patients: BloggerCourse.com  Fact Sheet for Healthcare Providers: SeriousBroker.it  This test is not yet approved or cleared by the [place] FDA and has been authorized for detection and/or diagnosis of SARS-CoV-2 by FDA  under an Emergency Use Authorization (EUA). This EUA will remain in effect (meaning this test can be used) for the duration of the COVID-19 declaration under Section 564(b)(1) of the Act, 21 U.S.C. section 360bbb-3(b)(1), unless the authorization is terminated or revoked.  Performed at [facility], [address] [place], [place] [address]     Radiology Reports DG Chest Portable 1 View  Result Date: 02/22/2021 CLINICAL DATA:  Chest pain and COPD with syncope. EXAM: PORTABLE CHEST 1 VIEW COMPARISON:  Oct 27, 2020. FINDINGS: EKG leads project over the chest. Image rotated slightly to the RIGHT, accounting for this cardiomediastinal contours are stable. Subtle opacity at the LEFT lung base likely in the lingula. No sign of pneumothorax. No sign of effusion on frontal view. On limited assessment no acute skeletal process. IMPRESSION: Subtle lingular opacity may represent atelectasis or developing infection. No lobar consolidation or effusion. Electronically Signed   By: Donzetta Kohut M.D.   On: 02/22/2021 16:35   CT Angio Chest/Abd/Pel for Dissection W and/or Wo Contrast  Result Date: 02/22/2021 CLINICAL DATA:  Abdominal pain, nausea/vomiting, COVID positive EXAM: CT ANGIOGRAPHY CHEST, ABDOMEN AND PELVIS TECHNIQUE: Non-contrast CT of the chest was initially obtained. Multidetector CT imaging through the chest, abdomen and pelvis was performed using the standard protocol during bolus administration of intravenous contrast. Multiplanar reconstructed images and MIPs were obtained and reviewed to evaluate the vascular anatomy. CONTRAST:  65mL OMNIPAQUE IOHEXOL 350 MG/ML SOLN COMPARISON:  Low-dose lung cancer screening CT chest dated 07/29/2020. CT abdomen/pelvis dated 01/10/2020. FINDINGS: CTA CHEST FINDINGS Cardiovascular: On unenhanced CT, there is no evidence of intramural hematoma. Following contrast administration, with preferential opacification of the thoracic aorta, there is no evidence of  thoracic aortic aneurysm or dissection. Atherosclerotic calcifications of the aortic arch. Although not tailored for evaluation of the pulmonary arteries, there is no evidence of central pulmonary embolism. Heart is normal in size.  Trace anterior pericardial fluid. Mediastinum/Nodes: No suspicious mediastinal lymphadenopathy. 8 mm right thyroid nodule (series 6/image 10), benign. Not clinically significant; no follow-up imaging recommended (ref: J Am Coll Radiol. 2015 Feb;12(2): 143-50). Lungs/Pleura: Mild centrilobular and paraseptal emphysematous changes, upper lung predominant. Scarring/atelectasis in the lingula and bilateral lower lobes. No focal consolidation despite the clinical history of COVID. Dominant 15 mm ground-glass nodule in the posterior right upper lobe (series 7/image 48), unchanged (previously 16 mm when measured in a similar fashion. Additional scattered ground-glass nodules are unchanged and better evaluated on low-dose lung cancer screening. No pleural effusion or pneumothorax. Musculoskeletal: Visualized osseous structures are within normal limits. Review of the MIP images confirms the above findings. CTA ABDOMEN AND PELVIS FINDINGS VASCULAR Aorta: No evidence of abdominal aortic aneurysm or dissection. Patent. Atherosclerotic calcifications. Celiac: Patent. SMA: Patent. Renals: Patent bilaterally. IMA: Patent. Inflow: Patent.  Atherosclerotic calcifications. Veins: Grossly unremarkable. Review of the MIP images confirms  the above findings. NON-VASCULAR Hepatobiliary: Mildly nodular hepatic contour. No focal hepatic lesion. Status post cholecystectomy. No intrahepatic or extrahepatic ductal dilatation. Pancreas: Within normal limits Spleen: Within normal limits. Adrenals/Urinary Tract: Adrenal glands are within normal limits. 16 mm interpolar left renal cyst (series 6/image 159). Right kidney is within normal limits. No renal calculi or hydronephrosis. Bladder is underdistended but grossly  unremarkable. Stomach/Bowel: Stomach is within normal limits. No evidence of bowel obstruction. Appendix is not discretely visualized. No colonic wall thickening or inflammatory changes. Lymphatic: No suspicious abdominopelvic lymphadenopathy. Reproductive: Uterus and bilateral ovaries are unremarkable. Other: No abdominopelvic ascites. Large lower abdominal midline ventral hernia containing multiple loops of nondilated small bowel as well as a small segment of sigmoid colon. No associated wall thickening, inflammatory changes, or fluid. Musculoskeletal: Visualized osseous structures are within normal limits. Review of the MIP images confirms the above findings. IMPRESSION: No evidence of thoracoabdominal aortic aneurysm or dissection. Stable ground-glass nodules in the lungs bilaterally, measuring up to 15 mm in the posterior right upper lobe. Continued attention on follow-up is suggested at the time of patient's low-dose lung cancer screening. Large lower midline ventral hernia containing multiple loops of nondilated small bowel and sigmoid colon, without associated inflammatory changes. No evidence of bowel obstruction. Additional stable ancillary findings as above. Electronically Signed   By: Charline Bills M.D.   On: 02/22/2021 20:19

## 2021-02-23 NOTE — Progress Notes (Signed)
  Echocardiogram 2D Echocardiogram has been performed.  Maria Oliver 02/23/2021, 9:04 AM

## 2021-02-23 NOTE — ED Notes (Signed)
Report attempted. Floor states room is not clean. Floor took number and stated they would return call when room is ready.

## 2021-02-24 ENCOUNTER — Ambulatory Visit: Payer: Self-pay

## 2021-02-24 DIAGNOSIS — N179 Acute kidney failure, unspecified: Secondary | ICD-10-CM | POA: Diagnosis not present

## 2021-02-24 DIAGNOSIS — I10 Essential (primary) hypertension: Secondary | ICD-10-CM | POA: Diagnosis not present

## 2021-02-24 DIAGNOSIS — J45909 Unspecified asthma, uncomplicated: Secondary | ICD-10-CM | POA: Diagnosis not present

## 2021-02-24 DIAGNOSIS — R55 Syncope and collapse: Secondary | ICD-10-CM | POA: Diagnosis not present

## 2021-02-24 DIAGNOSIS — U071 COVID-19: Secondary | ICD-10-CM | POA: Diagnosis not present

## 2021-02-24 LAB — CBC WITH DIFFERENTIAL/PLATELET
Abs Immature Granulocytes: 0.01 10*3/uL (ref 0.00–0.07)
Basophils Absolute: 0 10*3/uL (ref 0.0–0.1)
Basophils Relative: 0 %
Eosinophils Absolute: 0 10*3/uL (ref 0.0–0.5)
Eosinophils Relative: 0 %
HCT: 42 % (ref 36.0–46.0)
Hemoglobin: 13.8 g/dL (ref 12.0–15.0)
Immature Granulocytes: 0 %
Lymphocytes Relative: 37 %
Lymphs Abs: 2.5 10*3/uL (ref 0.7–4.0)
MCH: 28.3 pg (ref 26.0–34.0)
MCHC: 32.9 g/dL (ref 30.0–36.0)
MCV: 86.2 fL (ref 80.0–100.0)
Monocytes Absolute: 0.8 10*3/uL (ref 0.1–1.0)
Monocytes Relative: 12 %
Neutro Abs: 3.4 10*3/uL (ref 1.7–7.7)
Neutrophils Relative %: 51 %
Platelets: 217 10*3/uL (ref 150–400)
RBC: 4.87 MIL/uL (ref 3.87–5.11)
RDW: 14 % (ref 11.5–15.5)
WBC: 6.8 10*3/uL (ref 4.0–10.5)
nRBC: 0 % (ref 0.0–0.2)

## 2021-02-24 LAB — MAGNESIUM: Magnesium: 1.9 mg/dL (ref 1.7–2.4)

## 2021-02-24 LAB — COMPREHENSIVE METABOLIC PANEL
ALT: 24 U/L (ref 0–44)
AST: 24 U/L (ref 15–41)
Albumin: 3.4 g/dL — ABNORMAL LOW (ref 3.5–5.0)
Alkaline Phosphatase: 57 U/L (ref 38–126)
Anion gap: 7 (ref 5–15)
BUN: 7 mg/dL — ABNORMAL LOW (ref 8–23)
CO2: 28 mmol/L (ref 22–32)
Calcium: 8.7 mg/dL — ABNORMAL LOW (ref 8.9–10.3)
Chloride: 104 mmol/L (ref 98–111)
Creatinine, Ser: 0.53 mg/dL (ref 0.44–1.00)
GFR, Estimated: >60 mL/min (ref >60)
Glucose, Bld: 87 mg/dL (ref 70–99)
Potassium: 3.2 mmol/L — ABNORMAL LOW (ref 3.5–5.1)
Sodium: 139 mmol/L (ref 135–145)
Total Bilirubin: 0.4 mg/dL (ref 0.3–1.2)
Total Protein: 5.8 g/dL — ABNORMAL LOW (ref 6.5–8.1)

## 2021-02-24 LAB — URINE CULTURE: Culture: <10000 — AB

## 2021-02-24 LAB — D-DIMER, QUANTITATIVE: D-Dimer, Quant: 0.4 ug/mL-FEU (ref 0.00–0.50)

## 2021-02-24 LAB — C-REACTIVE PROTEIN: CRP: 0.7 mg/dL (ref <1.0)

## 2021-02-24 LAB — PROCALCITONIN: Procalcitonin: <0.1 ng/mL

## 2021-02-24 LAB — BRAIN NATRIURETIC PEPTIDE: B Natriuretic Peptide: 165 pg/mL — ABNORMAL HIGH (ref 0.0–100.0)

## 2021-02-24 MED ORDER — ADVAIR HFA 115-21 MCG/ACT IN AERO: 2.0000 | INHALATION_SPRAY | Freq: Two times a day (BID) | RESPIRATORY_TRACT | 12 refills | Status: DC

## 2021-02-24 MED ORDER — POTASSIUM CHLORIDE 20 MEQ PO PACK: 40.0000 meq | PACK | Freq: Once | ORAL | Status: DC

## 2021-02-24 MED ORDER — POTASSIUM CHLORIDE 20 MEQ PO PACK
40.0000 meq | PACK | Freq: Once | ORAL | Status: AC
  Administered 2021-02-24: 40 meq via ORAL
  Filled 2021-02-24: qty 2

## 2021-02-24 NOTE — Discharge Summary (Addendum)
Maria Oliver:[id number] DOB: 05/01/1961 DOA: 02/22/2021  PCP: Hoy Register, MD  Admit date: 02/22/2021  Discharge date: 02/24/2021  Admitted From: Home  Disposition:  Home   Recommendations for Outpatient Follow-up:   Follow up with PCP in 1-2 weeks  PCP Please obtain BMP/CBC, 2 view CXR in 1week,  (see Discharge instructions)   PCP Please follow up on the following pending results: Check blood pressure and orthostatics next admission, if no further episodes of syncope should be safe to commence driving, needs outpatient pulmonary follow-up for lung nodule.   Home Health: None  Equipment/Devices: None  Consultations: None  Discharge Condition: Stable    CODE STATUS: Full    Diet Recommendation: Heart Healthy   Diet Order             Diet - low sodium heart healthy           Diet Heart Room service appropriate? Yes; Fluid consistency: Thin  Diet effective now                    Chief Complaint  Patient presents with   Loss of Consciousness   Abdominal Pain     Brief history of present illness from the day of admission and additional interim summary    Maria Oliver is a 60 y.o. female with history of hypertension was brought to the ER after patient had brief episode of loss of consciousness.  Patient has been experiencing flulike symptoms with generalized body ache headache weakness for the last 2 to 3 days, she is not vaccinated for COVID, at home while sitting she became weak and apparently lost consciousness for about 5 minutes while in the chair, she was brought to the ER, in the ER she was diagnosed with COVID infection, dehydration, AKI and hypotension and admitted to the hospital.                                                                 Hospital Course     Acute COVID  infection in a patient who is not vaccinated causing dehydration, hypotension and syncope - she seems to have so far mild COVID infection, inflammatory markers are stable.  Currently no pulmonary symptoms.  Dehydration and orthostasis improved after IV fluids, symptom free now. DC home, no Covid Rx needed.    2.  Syncope with loss of consciousness.  As in #1 above. Stable echocardiogram, symptom free now, no headache or focal deficits.   3.  Right lung nodule.  Outpatient follow-up with pulmonary 4 to 6 weeks post discharge, counseled to quit smoking.   4.  Smoking.  Counseled to quit.   5.  AKI due to dehydration. Resolved post IVF.   6.  Mild QTC prolongation.  1 g of IV  mag, replaced K, stable on telemetry.  Symptom-free.  Echo stable. Stop Zoloft, PCP to recheck EKG in 7-10 days.  Repeat EKG here shows much improved QTC with minimal prolongation 486 ms.  7. Hypokalemia - replaced.   Discharge diagnosis     Principal Problem:   Syncope Active Problems:   Essential hypertension   COPD (chronic obstructive pulmonary disease) with emphysema (HCC)   COVID-19 virus infection   ARF (acute renal failure) (HCC)    Discharge instructions    Discharge Instructions     Diet - low sodium heart healthy   Complete by: As directed    Discharge instructions   Complete by: As directed    Do not drive till you have seen your PCP and cleared to do so again.  Follow with Primary MD Hoy Register, MD in 7 days   Get CBC, CMP, 2 view Chest X ray -  checked next visit within 1 week by Primary MD   Activity: As tolerated with Full fall precautions use walker/cane & assistance as needed  Disposition Home    Diet: Heart Healthy   Special Instructions: If you have smoked or chewed Tobacco  in the last 2 yrs please stop smoking, stop any regular Alcohol  and or any Recreational drug use.  On your next visit with your primary care physician please Get Medicines reviewed and  adjusted.  Please request your Prim.MD to go over all Hospital Tests and Procedure/Radiological results at the follow up, please get all Hospital records sent to your Prim MD by signing hospital release before you go home.  If you experience worsening of your admission symptoms, develop shortness of breath, life threatening emergency, suicidal or homicidal thoughts you must seek medical attention immediately by calling 911 or calling your MD immediately  if symptoms less severe.  You Must read complete instructions/literature along with all the possible adverse reactions/side effects for all the Medicines you take and that have been prescribed to you. Take any new Medicines after you have completely understood and accpet all the possible adverse reactions/side effects.   Increase activity slowly   Complete by: As directed    MyChart COVID-19 home monitoring program   Complete by: Feb 24, 2021    Is the patient willing to use the MyChart Mobile App for home monitoring?: Yes   Temperature monitoring   Complete by: Feb 24, 2021    After how many days would you like to receive a notification of this patient's flowsheet entries?: 1       Discharge Medications   Allergies as of 02/24/2021       Reactions   Aspirin Other (See Comments)   REACTION: GI upset/Hernia    Ciprofloxacin Other (See Comments)   Tendonitis, joint pain, and nausea.  "Interfered with her HCTZ" (also)   Codeine Nausea And Vomiting, Other (See Comments)   (Also) GI upset/Hernia (CAN TOLERATE DILAUDID & MORPHINE)   Eggs Or Egg-derived Products Nausea Only   Stomach pains   Fentanyl Nausea Only   Tolerates Dilaudid or morphine much better   Hydrocodone Nausea And Vomiting   Lactose Intolerance (gi) Nausea And Vomiting, Other (See Comments)   (Also) vertigo   Oxycodone Nausea And Vomiting   Tolerates hydromorphone better   Penicillins Rash   Has patient had a PCN reaction causing immediate rash, facial/tongue/throat  swelling, SOB or lightheadedness with hypotension: Yes Has patient had a PCN reaction causing severe rash involving mucus membranes or skin necrosis: No Has  patient had a PCN reaction that required hospitalization No Has patient had a PCN reaction occurring within the last 10 years: No If all of the above answers are "NO", then may proceed with Cephalosporin use.   Tape Rash   Please try paper tape        Medication List     STOP taking these medications    ibuprofen 800 MG tablet Commonly known as: ADVIL   predniSONE 20 MG tablet Commonly known as: DELTASONE   sertraline 50 MG tablet Commonly known as: ZOLOFT       TAKE these medications    acetaminophen 500 MG tablet Commonly known as: TYLENOL Take 500 mg by mouth 4 (four) times daily as needed for headache.   Advair HFA 115-21 MCG/ACT inhaler Generic drug: fluticasone-salmeterol Inhale 2 puffs into the lungs 2 (two) times daily. What changed:  when to take this reasons to take this   albuterol 108 (90 Base) MCG/ACT inhaler Commonly known as: VENTOLIN HFA Inhale 2 puffs into the lungs every 6 (six) hours as needed for wheezing or shortness of breath.   guaiFENesin 600 MG 12 hr tablet Commonly known as: Mucinex Take 1 tablet (600 mg total) by mouth 2 (two) times daily.   lactose free nutrition Liqd Take 237 mLs by mouth every other day.   lisinopril 20 MG tablet Commonly known as: ZESTRIL Take 1 tablet (20 mg total) by mouth daily. What changed: when to take this   multivitamin with minerals Tabs tablet Take 1 tablet by mouth daily.   Vitamin D (Ergocalciferol) 1.25 MG (50000 UNIT) Caps capsule Commonly known as: DRISDOL Take 1 capsule (50,000 Units total) by mouth every 7 (seven) days.         Follow-up Information     Hoy Register, MD. Schedule an appointment as soon as possible for a visit in 1 week(s).   Specialty: Family Medicine Contact information: [address] [place]  [place] [address] [phone]         Josephine Igo, DO. Schedule an appointment as soon as possible for a visit in 1 week(s).   Specialty: Pulmonary Disease Why: lung nodule Contact information: [address] [place] [address] [phone]                 Major procedures and Radiology Reports - PLEASE review detailed and final reports thoroughly  -        DG Chest Portable 1 View  Result Date: 02/22/2021 CLINICAL DATA:  Chest pain and COPD with syncope. EXAM: PORTABLE CHEST 1 VIEW COMPARISON:  Oct 27, 2020. FINDINGS: EKG leads project over the chest. Image rotated slightly to the RIGHT, accounting for this cardiomediastinal contours are stable. Subtle opacity at the LEFT lung base likely in the lingula. No sign of pneumothorax. No sign of effusion on frontal view. On limited assessment no acute skeletal process. IMPRESSION: Subtle lingular opacity may represent atelectasis or developing infection. No lobar consolidation or effusion. Electronically Signed   By: Donzetta Kohut M.D.   On: 02/22/2021 16:35   CT Angio Chest/Abd/Pel for Dissection W and/or Wo Contrast  Result Date: 02/22/2021 CLINICAL DATA:  Abdominal pain, nausea/vomiting, COVID positive EXAM: CT ANGIOGRAPHY CHEST, ABDOMEN AND PELVIS TECHNIQUE: Non-contrast CT of the chest was initially obtained. Multidetector CT imaging through the chest, abdomen and pelvis was performed using the standard protocol during bolus administration of intravenous contrast. Multiplanar reconstructed images and MIPs were obtained and reviewed to evaluate the vascular  anatomy. CONTRAST:  71mL OMNIPAQUE IOHEXOL 350 MG/ML SOLN COMPARISON:  Low-dose lung cancer screening CT chest dated 07/29/2020. CT abdomen/pelvis dated 01/10/2020. FINDINGS: CTA CHEST FINDINGS Cardiovascular: On unenhanced CT, there is no evidence of intramural hematoma. Following contrast administration, with preferential opacification of the thoracic aorta, there is  no evidence of thoracic aortic aneurysm or dissection. Atherosclerotic calcifications of the aortic arch. Although not tailored for evaluation of the pulmonary arteries, there is no evidence of central pulmonary embolism. Heart is normal in size.  Trace anterior pericardial fluid. Mediastinum/Nodes: No suspicious mediastinal lymphadenopathy. 8 mm right thyroid nodule (series 6/image 10), benign. Not clinically significant; no follow-up imaging recommended (ref: J Am Coll Radiol. 2015 Feb;12(2): 143-50). Lungs/Pleura: Mild centrilobular and paraseptal emphysematous changes, upper lung predominant. Scarring/atelectasis in the lingula and bilateral lower lobes. No focal consolidation despite the clinical history of COVID. Dominant 15 mm ground-glass nodule in the posterior right upper lobe (series 7/image 48), unchanged (previously 16 mm when measured in a similar fashion. Additional scattered ground-glass nodules are unchanged and better evaluated on low-dose lung cancer screening. No pleural effusion or pneumothorax. Musculoskeletal: Visualized osseous structures are within normal limits. Review of the MIP images confirms the above findings. CTA ABDOMEN AND PELVIS FINDINGS VASCULAR Aorta: No evidence of abdominal aortic aneurysm or dissection. Patent. Atherosclerotic calcifications. Celiac: Patent. SMA: Patent. Renals: Patent bilaterally. IMA: Patent. Inflow: Patent.  Atherosclerotic calcifications. Veins: Grossly unremarkable. Review of the MIP images confirms the above findings. NON-VASCULAR Hepatobiliary: Mildly nodular hepatic contour. No focal hepatic lesion. Status post cholecystectomy. No intrahepatic or extrahepatic ductal dilatation. Pancreas: Within normal limits Spleen: Within normal limits. Adrenals/Urinary Tract: Adrenal glands are within normal limits. 16 mm interpolar left renal cyst (series 6/image 159). Right kidney is within normal limits. No renal calculi or hydronephrosis. Bladder is  underdistended but grossly unremarkable. Stomach/Bowel: Stomach is within normal limits. No evidence of bowel obstruction. Appendix is not discretely visualized. No colonic wall thickening or inflammatory changes. Lymphatic: No suspicious abdominopelvic lymphadenopathy. Reproductive: Uterus and bilateral ovaries are unremarkable. Other: No abdominopelvic ascites. Large lower abdominal midline ventral hernia containing multiple loops of nondilated small bowel as well as a small segment of sigmoid colon. No associated wall thickening, inflammatory changes, or fluid. Musculoskeletal: Visualized osseous structures are within normal limits. Review of the MIP images confirms the above findings. IMPRESSION: No evidence of thoracoabdominal aortic aneurysm or dissection. Stable ground-glass nodules in the lungs bilaterally, measuring up to 15 mm in the posterior right upper lobe. Continued attention on follow-up is suggested at the time of patient's low-dose lung cancer screening. Large lower midline ventral hernia containing multiple loops of nondilated small bowel and sigmoid colon, without associated inflammatory changes. No evidence of bowel obstruction. Additional stable ancillary findings as above. Electronically Signed   By: Charline Bills M.D.   On: 02/22/2021 20:19   ECHOCARDIOGRAM LIMITED  Result Date: 02/23/2021    ECHOCARDIOGRAM LIMITED REPORT   Patient Name:   Maria Oliver Date of Exam: 02/23/2021 Medical Rec #:  [id number]       Height:       64.0 in Accession #:    [id number]      Weight:       150.0 lb Date of Birth:  [birth year]       BSA:          1.731 m Patient Age:    60 years        BP:  164/104 mmHg Patient Gender: F               HR:           63 bpm. Exam Location:  Inpatient Procedure: Cardiac Doppler, Color Doppler and Limited Echo Indications:    Syncope  History:        Patient has no prior history of Echocardiogram examinations.                 COPD, Signs/Symptoms:Shortness of  Breath; Risk Factors:Current                 Smoker and Hypertension. COVID-19.  Sonographer:    Ross Ludwig RDCS (AE) Referring Phys: [id number] Meryle Ready [facility]  Sonographer Comments: Suboptimal parasternal window. COVID-19. IMPRESSIONS  1. Limited echo for syncope, COVID-19 positive  2. Left ventricular ejection fraction, by estimation, is 60 to 65%. The left ventricle has normal function. The left ventricle has no regional wall motion abnormalities. Left ventricular diastolic parameters are consistent with Grade I diastolic dysfunction (impaired relaxation).  3. Right ventricular systolic function is normal. The right ventricular size is normal.  4. The inferior vena cava is normal in size with greater than 50% respiratory variability, suggesting right atrial pressure of 3 mmHg. Comparison(s): No prior Echocardiogram. FINDINGS  Left Ventricle: Left ventricular ejection fraction, by estimation, is 60 to 65%. The left ventricle has normal function. The left ventricle has no regional wall motion abnormalities. The left ventricular internal cavity size was normal in size. There is  no left ventricular hypertrophy. Left ventricular diastolic parameters are consistent with Grade I diastolic dysfunction (impaired relaxation). Indeterminate filling pressures. Right Ventricle: The right ventricular size is normal. No increase in right ventricular wall thickness. Right ventricular systolic function is normal. Aorta: The aortic root and ascending aorta are structurally normal, with no evidence of dilitation. Venous: The inferior vena cava is normal in size with greater than 50% respiratory variability, suggesting right atrial pressure of 3 mmHg. LEFT VENTRICLE PLAX 2D LVIDd:         3.70 cm  Diastology LVIDs:         2.70 cm  LV e' medial:    7.18 cm/s LV PW:         1.10 cm  LV E/e' medial:  12.2 LV IVS:        1.00 cm  LV e' lateral:   7.62 cm/s LVOT diam:     1.60 cm  LV E/e' lateral: 11.5 LVOT Area:     2.01 cm  IVC IVC  diam: 1.70 cm LEFT ATRIUM         Index LA diam:    2.60 cm 1.50 cm/m   AORTA Ao Root diam: 2.30 cm Ao Asc diam:  2.60 cm MITRAL VALVE MV Area (PHT): 2.84 cm     SHUNTS MV Decel Time: 267 msec     Systemic Diam: 1.60 cm MV E velocity: 87.50 cm/s MV A velocity: 116.00 cm/s MV E/A ratio:  0.75 Zoila Shutter MD Electronically signed by Zoila Shutter MD Signature Date/Time: 02/23/2021/10:34:22 AM    Final     Micro Results     Recent Results (from the past 240 hour(s))  SARS CORONAVIRUS 2 (TAT 6-24 HRS) Nasopharyngeal Nasopharyngeal Swab     Status: Abnormal   Collection Time: 02/21/21  6:18 PM   Specimen: Nasopharyngeal Swab  Result Value Ref Range Status   SARS Coronavirus 2 POSITIVE (A) NEGATIVE Final    Comment: (NOTE)  SARS-CoV-2 target nucleic acids are DETECTED.  The SARS-CoV-2 RNA is generally detectable in upper and lower respiratory specimens during the acute phase of infection. Positive results are indicative of the presence of SARS-CoV-2 RNA. Clinical correlation with patient history and other diagnostic information is  necessary to determine patient infection status. Positive results do not rule out bacterial infection or co-infection with other viruses.  The expected result is Negative.  Fact Sheet for Patients: HairSlick.no  Fact Sheet for Healthcare Providers: quierodirigir.com  This test is not yet approved or cleared by the [place] FDA and  has been authorized for detection and/or diagnosis of SARS-CoV-2 by FDA under an Emergency Use Authorization (EUA). This EUA will remain  in effect (meaning this test can be used) for the duration of the COVID-19 declaration under Section 564(b)(1) of the Act, 21 U. S.C. section 360bbb-3(b)(1), unless the authorization is terminated or revoked sooner.   Performed at [facility], [address], [place], [place] [address]   Culture, blood (routine x 2)      Status: None (Preliminary result)   Collection Time: 02/22/21  3:50 PM   Specimen: BLOOD LEFT FOREARM  Result Value Ref Range Status   Specimen Description BLOOD LEFT FOREARM  Final   Special Requests   Final    BOTTLES DRAWN AEROBIC AND ANAEROBIC Blood Culture adequate volume   Culture   Final    NO GROWTH 2 DAYS Performed at [facility], [address] [place], [place] [address]    Report Status PENDING  Incomplete  Resp Panel by RT-PCR (Flu A&B, Covid) Nasopharyngeal Swab     Status: Abnormal   Collection Time: 02/22/21  4:00 PM   Specimen: Nasopharyngeal Swab; Nasopharyngeal(NP) swabs in vial transport medium  Result Value Ref Range Status   SARS Coronavirus 2 by RT PCR POSITIVE (A) NEGATIVE Final    Comment: RESULT CALLED TO, READ BACK BY AND VERIFIED WITH: S,WIRTZ  02/22/21 EB (NOTE) SARS-CoV-2 target nucleic acids are DETECTED.  The SARS-CoV-2 RNA is generally detectable in upper respiratory specimens during the acute phase of infection. Positive results are indicative of the presence of the identified virus, but do not rule out bacterial infection or co-infection with other pathogens not detected by the test. Clinical correlation with patient history and other diagnostic information is necessary to determine patient infection status. The expected result is Negative.  Fact Sheet for Patients: BloggerCourse.com  Fact Sheet for Healthcare Providers: SeriousBroker.it  This test is not yet approved or cleared by the [place] FDA and  has been authorized for detection and/or diagnosis of SARS-CoV-2 by FDA under an Emergency Use Authorization (EUA).  This EUA will remain in effect (meaning this test can be used) fo r the duration of  the COVID-19 declaration under Section 564(b)(1) of the Act, 21 U.S.C. section 360bbb-3(b)(1), unless the authorization is terminated or revoked sooner.     Influenza A by PCR  NEGATIVE NEGATIVE Final   Influenza B by PCR NEGATIVE NEGATIVE Final    Comment: (NOTE) The Xpert Xpress SARS-CoV-2/FLU/RSV plus assay is intended as an aid in the diagnosis of influenza from Nasopharyngeal swab specimens and should not be used as a sole basis for treatment. Nasal washings and aspirates are unacceptable for Xpert Xpress SARS-CoV-2/FLU/RSV testing.  Fact Sheet for Patients: BloggerCourse.com  Fact Sheet for Healthcare Providers: SeriousBroker.it  This test is not yet approved or cleared by the [place] FDA and has been authorized for detection and/or  diagnosis of SARS-CoV-2 by FDA under an Emergency Use Authorization (EUA). This EUA will remain in effect (meaning this test can be used) for the duration of the COVID-19 declaration under Section 564(b)(1) of the Act, 21 U.S.C. section 360bbb-3(b)(1), unless the authorization is terminated or revoked.  Performed at [facility], [address], [place], [place] [address]   Culture, blood (routine x 2)     Status: None (Preliminary result)   Collection Time: 02/22/21  4:20 PM   Specimen: BLOOD LEFT WRIST  Result Value Ref Range Status   Specimen Description BLOOD LEFT WRIST  Final   Special Requests   Final    BOTTLES DRAWN AEROBIC ONLY Blood Culture results may not be optimal due to an inadequate volume of blood received in culture bottles   Culture   Final    NO GROWTH 2 DAYS Performed at [facility], [address], [place], [place] [address]    Report Status PENDING  Incomplete  Urine Culture     Status: Abnormal   Collection Time: 02/22/21  6:43 PM   Specimen: Urine, Clean Catch  Result Value Ref Range Status   Specimen Description URINE, CLEAN CATCH  Final   Special Requests NONE  Final   Culture (A)  Final    <10,000 COLONIES/mL INSIGNIFICANT GROWTH Performed at [facility], [address], [place], [place] [address]    Report  Status 02/24/2021 FINAL  Final    Today   Subjective    Maria Oliver today has no headache,no chest abdominal pain,no new weakness tingling or numbness, feels much better wants to go home today.     Objective   Blood pressure (!) 150/92, pulse 65, temperature 97.9 F (36.6 C), temperature source Oral, resp. rate 19, height 5\' 4"  (1.626 m), weight 68 kg, SpO2 98 %.   Intake/Output Summary (Last 24 hours) at 02/24/2021 1100 Last data filed at 02/23/2021 2200 Gross per 24 hour  Intake 500 ml  Output --  Net 500 ml    Exam  Awake Alert, No new F.N deficits, Normal affect [place].AT,PERRAL Supple Neck,No JVD, No cervical lymphadenopathy appriciated.  Symmetrical Chest wall movement, Good air movement bilaterally, CTAB RRR,No Gallops,Rubs or new Murmurs, No Parasternal Heave +ve B.Sounds, Abd Soft, Non tender, No organomegaly appriciated, No rebound -guarding or rigidity. No Cyanosis, Clubbing or edema, No new Rash or bruise   Data Review   CBC w Diff:  Lab Results  Component Value Date   WBC 6.8 02/24/2021   HGB 13.8 02/24/2021   HGB 14.3 11/02/2017   HCT 42.0 02/24/2021   HCT 44.0 11/02/2017   PLT 217 02/24/2021   PLT 375 11/02/2017   LYMPHOPCT 37 02/24/2021   BANDSPCT 0 08/19/2008   MONOPCT 12 02/24/2021   EOSPCT 0 02/24/2021   BASOPCT 0 02/24/2021    CMP:  Lab Results  Component Value Date   NA 139 02/24/2021   NA 143 02/28/2019   K 3.2 (L) 02/24/2021   CL 104 02/24/2021   CO2 28 02/24/2021   BUN 7 (L) 02/24/2021   BUN 11 02/28/2019   CREATININE 0.53 02/24/2021   CREATININE 0.67 08/11/2015   PROT 5.8 (L) 02/24/2021   PROT 6.7 02/28/2019   ALBUMIN 3.4 (L) 02/24/2021   ALBUMIN 4.6 02/28/2019   BILITOT 0.4 02/24/2021   BILITOT <0.2 02/28/2019   ALKPHOS 57 02/24/2021   AST 24 02/24/2021   ALT 24 02/24/2021  .   Total Time  in preparing paper work, data evaluation and todays exam - 35 minutes  Susa Raring M.D on 02/24/2021 at 11:00 AM  [facility]

## 2021-02-24 NOTE — Telephone Encounter (Signed)
Pt discharged from hospital today and pt wanting to know what she can take for pin. Per the discharge papers, she was ordered not to take Ibuprofen.  Advised pt that she can take Acetaminophen 500 mg every 4 hours as needed. Advised pt to try cold compresses to the back of the neck or any place that feels better. Instructed pt remove compresses after 20 minutes to avoid frost bite. Care advice given and pt verbalized understanding      Reason for Disposition  General information question, no triage required and triager able to answer question  Answer Assessment - Initial Assessment Questions 1. REASON FOR CALL or QUESTION: "What is your reason for calling today?" or "How can I best help you?" or "What question do you have that I can help answer?"     What can she take for pain.  Protocols used: Information Only Call - No Triage-A-AH

## 2021-02-24 NOTE — Evaluation (Signed)
Physical Therapy Evaluation Patient Details Name: Maria Oliver MRN: [id number] DOB: 03/20/1961 Today's Date: 02/24/2021  History of Present Illness  Pt is 60 yo female admitted on 02/22/21 with COVID, dehydration, AKI, and hypotension. She had episode at home of weakness and lost consciousness. She was orthostatic at admission but improved with hydration. Pt with hx including HTN.  Clinical Impression  Pt admitted with above dx.  She presents with baseline/independent mobility.  Demonstrated safe gait and dynamic balance with VSS and no lightheadedness or dizziness.  Pt safe to return home alone from PT perspective.  She has a friend and son that can assist if needed the next few days.        Recommendations for follow up therapy are one component of a multi-disciplinary discharge planning process, led by the attending physician.  Recommendations may be updated based on patient status, additional functional criteria and insurance authorization.  Follow Up Recommendations No PT follow up    Equipment Recommendations  None recommended by PT    Recommendations for Other Services       Precautions / Restrictions Precautions Precautions: None      Mobility  Bed Mobility Overal bed mobility: Independent             General bed mobility comments: sitting EOB at arrival    Transfers Overall transfer level: Independent Equipment used: None Transfers: Sit to/from Stand Sit to Stand: Independent         General transfer comment: Had supervision during eval but performed at independent level  Ambulation/Gait Ambulation/Gait assistance: Supervision Gait Distance (Feet): 200 Feet Assistive device: None Gait Pattern/deviations: Step-through pattern;WFL(Within Functional Limits) Gait velocity: normal   General Gait Details: Made several laps in room without difficulty. VSS on RA.  No dizziness/lightheadedness.  Stairs            Wheelchair Mobility    Modified  Rankin (Stroke Patients Only)       Balance Overall balance assessment: Independent   Sitting balance-Leahy Scale: Normal       Standing balance-Leahy Scale: Normal                 High Level Balance Comments: Able to stand feet together EO, EC; turn; pick small item from floor; and reach outside BOS all steadily and without dizziness             Pertinent Vitals/Pain Pain Assessment: 0-10 Pain Score: 3  Pain Location: headache Pain Descriptors / Indicators: Aching Pain Intervention(s): Monitored during session    Home Living Family/patient expects to be discharged to:: Private residence Living Arrangements: Alone Available Help at Discharge: Friend(s);Available PRN/intermittently Type of Home: Mobile home Home Access: Stairs to enter Entrance Stairs-Rails: Doctor, general practice of Steps: 3 Home Layout: One level Home Equipment: None      Prior Function Level of Independence: Independent               Hand Dominance        Extremity/Trunk Assessment   Upper Extremity Assessment Upper Extremity Assessment: Overall WFL for tasks assessed    Lower Extremity Assessment Lower Extremity Assessment: Overall WFL for tasks assessed    Cervical / Trunk Assessment Cervical / Trunk Assessment: Normal  Communication   Communication: No difficulties  Cognition Arousal/Alertness: Awake/alert Behavior During Therapy: WFL for tasks assessed/performed Overall Cognitive Status: Within Functional Limits for tasks assessed  General Comments      Exercises     Assessment/Plan    PT Assessment Patent does not need any further PT services  PT Problem List         PT Treatment Interventions      PT Goals (Current goals can be found in the Care Plan section)  Acute Rehab PT Goals Patient Stated Goal: return home PT Goal Formulation: All assessment and education complete, DC  therapy    Frequency     Barriers to discharge        Co-evaluation               AM-PAC PT "6 Clicks" Mobility  Outcome Measure Help needed turning from your back to your side while in a flat bed without using bedrails?: None Help needed moving from lying on your back to sitting on the side of a flat bed without using bedrails?: None Help needed moving to and from a bed to a chair (including a wheelchair)?: None Help needed standing up from a chair using your arms (e.g., wheelchair or bedside chair)?: None Help needed to walk in hospital room?: None Help needed climbing 3-5 steps with a railing? : A Little 6 Click Score: 23    End of Session   Activity Tolerance: Patient tolerated treatment well Patient left: in bed;with call bell/phone within reach Nurse Communication: Mobility status      Time: 0737-1062 PT Time Calculation (min) (ACUTE ONLY): 16 min   Charges:   PT Evaluation $PT Eval Low Complexity: 1 Low          Veleria Barnhardt, PT Acute Rehab Services Pager [phone] [facility] [phone]   Rayetta Humphrey 02/24/2021, 11:32 AM

## 2021-02-24 NOTE — Progress Notes (Signed)
Discharge education and packet provided to patient at bedside, all questions and concerns addressed, pt is a self driver.

## 2021-02-24 NOTE — Discharge Instructions (Addendum)
Do not drive till you have seen your PCP and cleared to do so again.  Follow with Primary MD Hoy Register, MD in 7 days   Get CBC, CMP, 2 view Chest X ray -  checked next visit within 1 week by Primary MD   Activity: As tolerated with Full fall precautions use walker/cane & assistance as needed  Disposition Home    Diet: Heart Healthy   Special Instructions: If you have smoked or chewed Tobacco  in the last 2 yrs please stop smoking, stop any regular Alcohol  and or any Recreational drug use.  On your next visit with your primary care physician please Get Medicines reviewed and adjusted.  Please request your Prim.MD to go over all Hospital Tests and Procedure/Radiological results at the follow up, please get all Hospital records sent to your Prim MD by signing hospital release before you go home.  If you experience worsening of your admission symptoms, develop shortness of breath, life threatening emergency, suicidal or homicidal thoughts you must seek medical attention immediately by calling 911 or calling your MD immediately  if symptoms less severe.  You Must read complete instructions/literature along with all the possible adverse reactions/side effects for all the Medicines you take and that have been prescribed to you. Take any new Medicines after you have completely understood and accpet all the possible adverse reactions/side effects.      Person Under Monitoring Name: Maria Oliver  Location: [address] [place] [address]   Infection Prevention Recommendations for Individuals Confirmed to have, or Being Evaluated for, 2019 Novel Coronavirus (COVID-19) Infection Who Receive Care at Home  Individuals who are confirmed to have, or are being evaluated for, COVID-19 should follow the prevention steps below until a healthcare provider or local or state health department says they can return to normal activities.  Stay home except to get medical care You  should restrict activities outside your home, except for getting medical care. Do not go to work, school, or public areas, and do not use public transportation or taxis.  Call ahead before visiting your doctor Before your medical appointment, call the healthcare provider and tell them that you have, or are being evaluated for, COVID-19 infection. This will help the healthcare provider's office take steps to keep other people from getting infected. Ask your healthcare provider to call the local or state health department.  Monitor your symptoms Seek prompt medical attention if your illness is worsening (e.g., difficulty breathing). Before going to your medical appointment, call the healthcare provider and tell them that you have, or are being evaluated for, COVID-19 infection. Ask your healthcare provider to call the local or state health department.  Wear a facemask You should wear a facemask that covers your nose and mouth when you are in the same room with other people and when you visit a healthcare provider. People who live with or visit you should also wear a facemask while they are in the same room with you.  Separate yourself from other people in your home As much as possible, you should stay in a different room from other people in your home. Also, you should use a separate bathroom, if available.  Avoid sharing household items You should not share dishes, drinking glasses, cups, eating utensils, towels, bedding, or other items with other people in your home. After using these items, you should wash them thoroughly with soap and water.  Cover your coughs and sneezes Cover your mouth and  nose with a tissue when you cough or sneeze, or you can cough or sneeze into your sleeve. Throw used tissues in a lined trash can, and immediately wash your hands with soap and water for at least 20 seconds or use an alcohol-based hand rub.  Wash your [employer] your hands often and thoroughly  with soap and water for at least 20 seconds. You can use an alcohol-based hand sanitizer if soap and water are not available and if your hands are not visibly dirty. Avoid touching your eyes, nose, and mouth with unwashed hands.   Prevention Steps for Caregivers and Household Members of Individuals Confirmed to have, or Being Evaluated for, COVID-19 Infection Being Cared for in the Home  If you live with, or provide care at home for, a person confirmed to have, or being evaluated for, COVID-19 infection please follow these guidelines to prevent infection:  Follow healthcare provider's instructions Make sure that you understand and can help the patient follow any healthcare provider instructions for all care.  Provide for the patient's basic needs You should help the patient with basic needs in the home and provide support for getting groceries, prescriptions, and other personal needs.  Monitor the patient's symptoms If they are getting sicker, call his or her medical provider and tell them that the patient has, or is being evaluated for, COVID-19 infection. This will help the healthcare provider's office take steps to keep other people from getting infected. Ask the healthcare provider to call the local or state health department.  Limit the number of people who have contact with the patient If possible, have only one caregiver for the patient. Other household members should stay in another home or place of residence. If this is not possible, they should stay in another room, or be separated from the patient as much as possible. Use a separate bathroom, if available. Restrict visitors who do not have an essential need to be in the home.  Keep older adults, very young children, and other sick people away from the patient Keep older adults, very young children, and those who have compromised immune systems or chronic health conditions away from the patient. This includes people with  chronic heart, lung, or kidney conditions, diabetes, and cancer.  Ensure good ventilation Make sure that shared spaces in the home have good air flow, such as from an air conditioner or an opened window, weather permitting.  Wash your hands often Wash your hands often and thoroughly with soap and water for at least 20 seconds. You can use an alcohol based hand sanitizer if soap and water are not available and if your hands are not visibly dirty. Avoid touching your eyes, nose, and mouth with unwashed hands. Use disposable paper towels to dry your hands. If not available, use dedicated cloth towels and replace them when they become wet.  Wear a facemask and gloves Wear a disposable facemask at all times in the room and gloves when you touch or have contact with the patient's blood, body fluids, and/or secretions or excretions, such as sweat, saliva, sputum, nasal mucus, vomit, urine, or feces.  Ensure the mask fits over your nose and mouth tightly, and do not touch it during use. Throw out disposable facemasks and gloves after using them. Do not reuse. Wash your hands immediately after removing your facemask and gloves. If your personal clothing becomes contaminated, carefully remove clothing and launder. Wash your hands after handling contaminated clothing. Place all used disposable facemasks, gloves, and  other waste in a lined container before disposing them with other household waste. Remove gloves and wash your hands immediately after handling these items.  Do not share dishes, glasses, or other household items with the patient Avoid sharing household items. You should not share dishes, drinking glasses, cups, eating utensils, towels, bedding, or other items with a patient who is confirmed to have, or being evaluated for, COVID-19 infection. After the person uses these items, you should wash them thoroughly with soap and water.  Wash laundry thoroughly Immediately remove and wash clothes  or bedding that have blood, body fluids, and/or secretions or excretions, such as sweat, saliva, sputum, nasal mucus, vomit, urine, or feces, on them. Wear gloves when handling laundry from the patient. Read and follow directions on labels of laundry or clothing items and detergent. In general, wash and dry with the warmest temperatures recommended on the label.  Clean all areas the individual has used often Clean all touchable surfaces, such as counters, tabletops, doorknobs, bathroom fixtures, toilets, phones, keyboards, tablets, and bedside tables, every day. Also, clean any surfaces that may have blood, body fluids, and/or secretions or excretions on them. Wear gloves when cleaning surfaces the patient has come in contact with. Use a diluted bleach solution (e.g., dilute bleach with 1 part bleach and 10 parts water) or a household disinfectant with a label that says EPA-registered for coronaviruses. To make a bleach solution at home, add 1 tablespoon of bleach to 1 quart (4 cups) of water. For a larger supply, add  cup of bleach to 1 gallon (16 cups) of water. Read labels of cleaning products and follow recommendations provided on product labels. Labels contain instructions for safe and effective use of the cleaning product including precautions you should take when applying the product, such as wearing gloves or eye protection and making sure you have good ventilation during use of the product. Remove gloves and wash hands immediately after cleaning.  Monitor yourself for signs and symptoms of illness Caregivers and household members are considered close contacts, should monitor their health, and will be asked to limit movement outside of the home to the extent possible. Follow the monitoring steps for close contacts listed on the symptom monitoring form.   ? If you have additional questions, contact your local health department or call the epidemiologist on call at [phone] (available  24/7). ? This guidance is subject to change. For the most up-to-date guidance from [facility], please refer to their website: [email or web address]

## 2021-02-25 ENCOUNTER — Telehealth: Payer: Self-pay

## 2021-02-25 NOTE — Telephone Encounter (Signed)
From the discharge call:  She said she is still having body aches, no chest pain.   she is concerned about her potassium and will discuss with Dr Alvis Lemmings.  she said she has all of her medications and has been taking them as instructed except she has not taken any guiafenesin because she does not think she needs it.  She said her chest is " not clogged."    Scheduled to see Dr Alvis Lemmings on 03/11/2021

## 2021-02-25 NOTE — Telephone Encounter (Signed)
Transition Care Management Follow-up Telephone Call Date of discharge and from where: 02/24/2021, [facility]  How have you been since you were released from the hospital? She said she is still having body aches, no chest pain.  Any questions or concerns? Yes - she is concerned about her potassium and will discuss with Dr Alvis Lemmings.  Items Reviewed: Did the pt receive and understand the discharge instructions provided? Yes  Medications obtained and verified? Yes -she said she has all of her medications and has been taking them as instructed except she has not taken any guiafenesin because she does not think she needs it.  She said her chest is " not clogged."  Other? No  Any new allergies since your discharge? No  Do you have support at home? Yes -lives alone but has friend who can check on her   Home Care and Equipment/Supplies: Were home health services ordered? no If so, what is the name of the agency? N/a  Has the agency set up a time to come to the patient's home? not applicable Were any new equipment or medical supplies ordered?  No What is the name of the medical supply agency? N/a Were you able to get the supplies/equipment? not applicable Do you have any questions related to the use of the equipment or supplies? No  Functional Questionnaire: (I = Independent and D = Dependent) ADLs: independent   Follow up appointments reviewed:  PCP Hospital f/u appt confirmed? Yes  Scheduled to see Dr Alvis Lemmings on 03/11/2021 Specialist Hospital f/u appt confirmed? No  - none scheduled at this time. Are transportation arrangements needed? No  If their condition worsens, is the pt aware to call PCP or go to the Emergency Dept.? Yes Was the patient provided with contact information for the PCP's office or ED? Yes Was to pt encouraged to call back with questions or concerns? Yes

## 2021-02-25 NOTE — Telephone Encounter (Signed)
Pt called in returning [place] call, and requested if she reach back out to her. Please advise.

## 2021-02-25 NOTE — Telephone Encounter (Signed)
Transition Care Management Unsuccessful Follow-up Telephone Call  Date of discharge and from where:  02/24/2021, [facility]  Attempts:  1st Attempt  Reason for unsuccessful TCM follow-up call:  Left voice message on # [phone].  Call back requested to this CM

## 2021-02-27 ENCOUNTER — Other Ambulatory Visit: Payer: Self-pay

## 2021-02-27 ENCOUNTER — Encounter (HOSPITAL_COMMUNITY): Payer: Self-pay | Admitting: Emergency Medicine

## 2021-02-27 ENCOUNTER — Ambulatory Visit: Payer: Self-pay | Admitting: *Deleted

## 2021-02-27 ENCOUNTER — Ambulatory Visit (HOSPITAL_COMMUNITY)
Admission: EM | Admit: 2021-02-27 | Discharge: 2021-02-27 | Disposition: A | Discharge status: Home / Self Care | Payer: Medicare Other | Attending: Internal Medicine | Admitting: Internal Medicine

## 2021-02-27 DIAGNOSIS — N390 Urinary tract infection, site not specified: Secondary | ICD-10-CM | POA: Diagnosis present

## 2021-02-27 DIAGNOSIS — R300 Dysuria: Secondary | ICD-10-CM

## 2021-02-27 LAB — CULTURE, BLOOD (ROUTINE X 2)
Culture: NO GROWTH
Culture: NO GROWTH
Special Requests: ADEQUATE

## 2021-02-27 LAB — POCT URINALYSIS DIPSTICK, ED / UC
Glucose, UA: NEGATIVE mg/dL
Leukocytes,Ua: NEGATIVE
Nitrite: POSITIVE — AB
Protein, ur: 100 mg/dL — AB
Specific Gravity, Urine: >=1.03 (ref 1.005–1.030)
Urobilinogen, UA: 0.2 mg/dL (ref 0.0–1.0)
pH: 6 (ref 5.0–8.0)

## 2021-02-27 MED ORDER — NITROFURANTOIN MONOHYD MACRO 100 MG PO CAPS: 100.0000 mg | ORAL_CAPSULE | Freq: Two times a day (BID) | ORAL | 0 refills | Status: DC

## 2021-02-27 NOTE — Discharge Instructions (Signed)
Take the macrobid twice a day for the next 5 days.    You can take Tylenol and/or Ibuprofen as needed for pain relief and fever reduction.   Make sure you are drinking plenty of fluids, especially water.  You can drink cranberry juice to help with symptom relief, but make sure it is cranberry juice and not cranberry cocktail.  You can also try AZO, cranberry pills, or pyridium as needed.    Return or go to the Emergency Department if symptoms worsen or do not improve in the next few days.  

## 2021-02-27 NOTE — ED Provider Notes (Signed)
[facility]    CSN: [id number] Arrival date & time: 02/27/21  1147      History   Chief Complaint Chief Complaint  Patient presents with   Dysuria    HPI Maria Oliver is a 60 y.o. female.   AndPatient here for evaluation of dysuria urgency that started yesterday.  Patient did test positive for COVID on Sunday and was in the hospital for 2 days.  Reports that she was dehydrated while in the hospital.  Has not tried any OTC medications or treatments.  Denies any trauma, injury, or other precipitating event.  Denies any specific alleviating or aggravating factors.  Denies any fevers, chest pain, shortness of breath, N/V/D, numbness, tingling, weakness, abdominal pain, or headaches.   The history is provided by the patient.  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 09/09/2016   Generalized headaches    Hernia, inguinal, right    Hypertension    Leg swelling     Patient Active Problem List   Diagnosis Date Noted   Syncope 02/22/2021   COVID-19 virus infection 02/22/2021   ARF (acute renal failure) (HCC) 02/22/2021   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/07/2017   Pruritic rash 10/14/2016   Chronic narcotic use 09/09/2016   Onychomycosis of right great toe 10/13/2015   Osteoarthritis 08/11/2015   Chronic abdominal pain 05/22/2015   Chronic pain syndrome 05/22/2015   Vitamin D insufficiency 02/06/2015   Chronic knee pain 02/06/2015   Ventral hernia without obstruction or gangrene 12/31/2014   Anxiety 12/31/2014   COPD (chronic obstructive pulmonary disease) with emphysema (HCC)    Tobacco abuse 10/01/2009   Essential  hypertension 10/01/2009    Past Surgical History:  Procedure Laterality Date   COLON SURGERY  2010   per patient "colon take down"   COLOSTOMY  2010   LAPAROSCOPIC CHOLECYSTECTOMY SINGLE SITE WITH INTRAOPERATIVE CHOLANGIOGRAM N/A 09/09/2016   Procedure: LAPAROSCOPIC CHOLECYSTECTOMY  WITH INTRAOPERATIVE CHOLANGIOGRAM;  Surgeon: Karie Soda, MD;  Location: [facility];  Service: General;  Laterality: N/A;   TUBAL LIGATION      OB History   No obstetric history on file.      Home Medications    Prior to Admission medications   Medication Sig Start Date End Date Taking? Authorizing Provider  nitrofurantoin, macrocrystal-monohydrate, (MACROBID) 100 MG capsule Take 1 capsule (100 mg total) by mouth 2 (two) times daily. 02/27/21  Yes Ivette Loyal, NP  acetaminophen (TYLENOL) 500 MG tablet Take 500 mg by mouth 4 (four) times daily as needed for headache.    [provider]  albuterol (VENTOLIN HFA) 108 (90 Base) MCG/ACT inhaler Inhale 2 puffs into the lungs every 6 (six) hours as needed for wheezing or shortness of breath. 12/10/20   Anders Simmonds, PA-C  fluticasone-salmeterol (ADVAIR HFA) 115-21 MCG/ACT inhaler Inhale 2 puffs into the lungs 2 (two) times daily. 02/24/21   Leroy Sea, MD  guaiFENesin (MUCINEX) 600 MG 12 hr tablet Take 1 tablet (600 mg total) by mouth 2 (two) times daily. Patient not taking: Reported on 02/22/2021 02/21/21   Valinda Hoar, NP  lactose free  nutrition (BOOST) LIQD Take 237 mLs by mouth every other day.    [provider]  lisinopril (ZESTRIL) 20 MG tablet Take 1 tablet (20 mg total) by mouth daily. Patient taking differently: Take 20 mg by mouth every evening. 12/10/20   Anders Simmonds, PA-C  Multiple Vitamin (MULTIVITAMIN WITH MINERALS) TABS tablet Take 1 tablet by mouth daily.    [provider]  Vitamin D, Ergocalciferol, (DRISDOL) 1.25 MG (50000 UNIT) CAPS capsule Take 1 capsule (50,000 Units total) by mouth every 7  (seven) days. Patient not taking: No sig reported 12/11/20   Anders Simmonds, PA-C    Family History Family History  Problem Relation Age of Onset   Heart disease Mother 51   Heart failure Mother    Asthma Mother    Emphysema Mother    Alzheimer's disease Father 73   Emphysema Brother    Asthma Brother    Colon cancer Neg Hx     Social History Social History   Tobacco Use   Smoking status: Every Day    Packs/day: 1.00    Years: 47.00    Pack years: 47.00    Types: Cigarettes   Smokeless tobacco: Never   Tobacco comments:    1/2 ppd 02/25/20   Vaping Use   Vaping Use: Never used  Substance Use Topics   Alcohol use: No    Alcohol/week: 0.0 standard drinks   Drug use: No     Allergies   Aspirin, Ciprofloxacin, Codeine, Eggs or egg-derived products, Fentanyl, Hydrocodone, Lactose intolerance (gi), Oxycodone, Penicillins, and Tape   Review of Systems Review of Systems  Genitourinary:  Positive for dysuria and urgency.  All other systems reviewed and are negative.   Physical Exam Triage Vital Signs ED Triage Vitals  Enc Vitals Group     BP 02/27/21 1315 134/77     Pulse Rate 02/27/21 1315 67     Resp 02/27/21 1315 18     Temp 02/27/21 1315 98.2 F (36.8 C)     Temp Source 02/27/21 1315 Oral     SpO2 02/27/21 1315 95 %     Weight --      Height --      Head Circumference --      Peak Flow --      Pain Score 02/27/21 1313 6     Pain Loc --      Pain Edu? --      Excl. in GC? --    No data found.  Updated Vital Signs BP 134/77 (BP Location: Left Arm)   Pulse 67   Temp 98.2 F (36.8 C) (Oral)   Resp 18   SpO2 95%   Visual Acuity Right Eye Distance:   Left Eye Distance:   Bilateral Distance:    Right Eye Near:   Left Eye Near:    Bilateral Near:     Physical Exam Vitals and nursing note reviewed.  Constitutional:      General: She is not in acute distress.    Appearance: Normal appearance. She is not ill-appearing, toxic-appearing or  diaphoretic.  HENT:     Head: Normocephalic and atraumatic.  Eyes:     Conjunctiva/sclera: Conjunctivae normal.  Cardiovascular:     Rate and Rhythm: Normal rate.     Pulses: Normal pulses.  Pulmonary:     Effort: Pulmonary effort is normal.  Abdominal:     General: Abdomen is flat.     Tenderness: There is no right  CVA tenderness or left CVA tenderness.  Genitourinary:    Comments: declines Musculoskeletal:        General: Normal range of motion.     Cervical back: Normal range of motion.  Skin:    General: Skin is warm and dry.  Neurological:     General: No focal deficit present.     Mental Status: She is alert and oriented to person, place, and time.  Psychiatric:        Mood and Affect: Mood normal.     UC Treatments / Results  Labs (all labs ordered are listed, but only abnormal results are displayed) Labs Reviewed  POCT URINALYSIS DIPSTICK, ED / UC - Abnormal; Notable for the following components:      Result Value   Bilirubin Urine SMALL (*)    Ketones, ur TRACE (*)    Hgb urine dipstick SMALL (*)    Protein, ur 100 (*)    Nitrite POSITIVE (*)    All other components within normal limits  URINE CULTURE  POCT URINALYSIS DIPSTICK, ED / UC    EKG   Radiology No results found.  Procedures Procedures (including critical care time)  Medications Ordered in UC Medications - No data to display  Initial Impression / Assessment and Plan / UC Course  I have reviewed the triage vital signs and the nursing notes.  Pertinent labs & imaging results that were available during my care of the patient were reviewed by me and considered in my medical decision making (see chart for details).    Assessment negative for red flags or concerns.  Urinalysis positive for nitrites, protein, hemoglobin, ketones, and bilirubin.  Urine culture pending.  We will treat with Macrobid twice daily for the next 5 days.  Encourage fluids and rest.  May try AZO, cranberry pills, or  Pyridium as needed for symptom management.  Follow-up with primary care for reevaluation. Final Clinical Impressions(s) / UC Diagnoses   Final diagnoses:  Dysuria  Lower urinary tract infectious disease     Discharge Instructions      Take the macrobid twice a day for the next 5 days.    You can take Tylenol and/or Ibuprofen as needed for pain relief and fever reduction.   Make sure you are drinking plenty of fluids, especially water.  You can drink cranberry juice to help with symptom relief, but make sure it is cranberry juice and not cranberry cocktail.  You can also try AZO, cranberry pills, or pyridium as needed.    Return or go to the Emergency Department if symptoms worsen or do not improve in the next few days.      ED Prescriptions     Medication Sig Dispense Auth. Provider   nitrofurantoin, macrocrystal-monohydrate, (MACROBID) 100 MG capsule Take 1 capsule (100 mg total) by mouth 2 (two) times daily. 10 capsule Ivette Loyal, NP      PDMP not reviewed this encounter.   Ivette Loyal, NP 02/27/21 1353

## 2021-02-27 NOTE — Telephone Encounter (Signed)
Patient is + COVID- she is having urinary symptoms. Patient advised virtual visit/UC. Patient uncomfortable with virtual platform- she is going to go to UC.

## 2021-02-27 NOTE — Telephone Encounter (Signed)
Patient called from UC- 2 hour wait reported- advised patient to stay for evaluation of her symptoms

## 2021-02-27 NOTE — Telephone Encounter (Signed)
Reason for Disposition  [1] SEVERE pain with urination (e.g., excruciating) AND [2] not improved after 2 hours of pain medicine and Sitz bath  Answer Assessment - Initial Assessment Questions 1. SYMPTOM: "What's the main symptom you're concerned about?" (e.g., frequency, incontinence)     Pain with urination, frequency 2. ONSET: "When did the  urinary symptoms  start?"     yesterday 3. PAIN: "Is there any pain?" If Yes, ask: "How bad is it?" (Scale: 1-10; mild, moderate, severe)     Yes- 8 4. CAUSE: "What do you think is causing the symptoms?"     UTI 5. OTHER SYMPTOMS: "Do you have any other symptoms?" (e.g., fever, flank pain, blood in urine, pain with urination)     Frequency, flank pain 6. PREGNANCY: "Is there any chance you are pregnant?" "When was your last menstrual period?"     N/a  Protocols used: Urinary Symptoms-A-AH, Urination Pain - Female-A-AH

## 2021-02-27 NOTE — ED Triage Notes (Signed)
Pt reports tested positive for Covid on Sunday and was hospital for 2 days. Reports that [employer] and wellness referred her to UC for her dysuria that started yesterday.

## 2021-03-01 LAB — URINE CULTURE: Culture: >=100000 — AB

## 2021-03-02 ENCOUNTER — Telehealth (HOSPITAL_COMMUNITY): Payer: Self-pay | Admitting: Emergency Medicine

## 2021-03-02 MED ORDER — SULFAMETHOXAZOLE-TRIMETHOPRIM 800-160 MG PO TABS: 1.0000 | ORAL_TABLET | Freq: Two times a day (BID) | ORAL | 0 refills | Status: AC

## 2021-03-03 ENCOUNTER — Telehealth: Payer: Self-pay | Admitting: Family Medicine

## 2021-03-03 ENCOUNTER — Ambulatory Visit: Payer: Self-pay

## 2021-03-03 NOTE — Telephone Encounter (Signed)
Pt asking if there is a drug interaction with lisinopril and Bactrim DS. Pt stated that the combination could  [employer] and [employer] and [employer].  Advised by Nehemiah Settle a pharmacist  at [facility] that since the pt will be taking just 3 days, there will not be any interactions.  Called [facility] and spoke with Jonny Ruiz who is the pharmacist and stated there will not be any interactions. Called pt and told her I called a second pharmacist who stated she can take both medications.  Reason for Disposition  Health Information question, no triage required and triager able to answer question  Answer Assessment - Initial Assessment Questions 1. REASON FOR CALL or QUESTION: "What is your reason for calling today?" or "How can I best help you?" or "What question do you have that I can help answer?"     Any drug interaction between lisinopril and Bactrim DS.  Protocols used: Information Only Call - No Triage-A-AH

## 2021-03-03 NOTE — Telephone Encounter (Signed)
Copied from CRM [id number]. Topic: General - Other >> Mar 03, 2021  8:20 AM Maria Oliver wrote: Reason for CRM: Pt called in stating she just went to the ER and was put on sulfamethoxazole-trimethoprim (BACTRIM DS) 800-160 MG tablet, but it interfers with her BP medication and wanted to speak with Erskine Squibb, please advise

## 2021-03-03 NOTE — Telephone Encounter (Signed)
This CM was present when Butch Penny, RPH/[facility] called the patient regarding her medication concern.  She is worried about her potassium level  and is concerned about the interaction of lisinopril and Bactrim. She said her potassium has been low for awhile.  He explained to her that the medication interaction, if any, could result in an increase in potassium,not a decrease in her potassium. He instructed her to take the medications and she said she understood.

## 2021-03-03 NOTE — Telephone Encounter (Signed)
Pt called back following up on request, please advise

## 2021-03-08 ENCOUNTER — Ambulatory Visit (HOSPITAL_BASED_OUTPATIENT_CLINIC_OR_DEPARTMENT_OTHER): Payer: Medicare Other

## 2021-03-08 DIAGNOSIS — Z0000 Encounter for general adult medical examination without abnormal findings: Secondary | ICD-10-CM | POA: Diagnosis not present

## 2021-03-08 DIAGNOSIS — Z1211 Encounter for screening for malignant neoplasm of colon: Secondary | ICD-10-CM

## 2021-03-08 NOTE — Patient Instructions (Signed)
Health Maintenance, Female Adopting a healthy lifestyle and getting preventive care are important in promoting health and wellness. Ask your health care provider about: The right schedule for you to have regular tests and exams. Things you can do on your own to prevent diseases and keep yourself healthy. What should I know about diet, weight, and exercise? Eat a healthy diet  Eat a diet that includes plenty of vegetables, fruits, low-fat dairy products, and lean protein. Do not eat a lot of foods that are high in solid fats, added sugars, or sodium. Maintain a healthy weight Body mass index (BMI) is used to identify weight problems. It estimates body fat based on height and weight. Your health care provider can help determine your BMI and help you achieve or maintain a healthy weight. Get regular exercise Get regular exercise. This is one of the most important things you can do for your health. Most adults should: Exercise for at least 150 minutes each week. The exercise should increase your heart rate and make you sweat (moderate-intensity exercise). Do strengthening exercises at least twice a week. This is in addition to the moderate-intensity exercise. Spend less time sitting. Even light physical activity can be beneficial. Watch cholesterol and blood lipids Have your blood tested for lipids and cholesterol at 62 years of age, then have this test every 5 years. Have your cholesterol levels checked more often if: Your lipid or cholesterol levels are high. You are older than 62 years of age. You are at high risk for heart disease. What should I know about cancer screening? Depending on your health history and family history, you may need to have cancer screening at various ages. This may include screening for: Breast cancer. Cervical cancer. Colorectal cancer. Skin cancer. Lung cancer. What should I know about heart disease, diabetes, and high blood pressure? Blood pressure and heart  disease High blood pressure causes heart disease and increases the risk of stroke. This is more likely to develop in people who have high blood pressure readings, are of African descent, or are overweight. Have your blood pressure checked: Every 3-5 years if you are 18-39 years of age. Every year if you are 40 years old or older. Diabetes Have regular diabetes screenings. This checks your fasting blood sugar level. Have the screening done: Once every three years after age 40 if you are at a normal weight and have a low risk for diabetes. More often and at a younger age if you are overweight or have a high risk for diabetes. What should I know about preventing infection? Hepatitis B If you have a higher risk for hepatitis B, you should be screened for this virus. Talk with your health care provider to find out if you are at risk for hepatitis B infection. Hepatitis C Testing is recommended for: Everyone born from 1945 through 1965. Anyone with known risk factors for hepatitis C. Sexually transmitted infections (STIs) Get screened for STIs, including gonorrhea and chlamydia, if: You are sexually active and are younger than 62 years of age. You are older than 62 years of age and your health care provider tells you that you are at risk for this type of infection. Your sexual activity has changed since you were last screened, and you are at increased risk for chlamydia or gonorrhea. Ask your health care provider if you are at risk. Ask your health care provider about whether you are at high risk for HIV. Your health care provider may recommend a prescription medicine   to help prevent HIV infection. If you choose to take medicine to prevent HIV, you should first get tested for HIV. You should then be tested every 3 months for as long as you are taking the medicine. Pregnancy If you are about to stop having your period (premenopausal) and you may become pregnant, seek counseling before you get  pregnant. Take 400 to 800 micrograms (mcg) of folic acid every day if you become pregnant. Ask for birth control (contraception) if you want to prevent pregnancy. Osteoporosis and menopause Osteoporosis is a disease in which the bones lose minerals and strength with aging. This can result in bone fractures. If you are 65 years old or older, or if you are at risk for osteoporosis and fractures, ask your health care provider if you should: Be screened for bone loss. Take a calcium or vitamin D supplement to lower your risk of fractures. Be given hormone replacement therapy (HRT) to treat symptoms of menopause. Follow these instructions at home: Lifestyle Do not use any products that contain nicotine or tobacco, such as cigarettes, e-cigarettes, and chewing tobacco. If you need help quitting, ask your health care provider. Do not use street drugs. Do not share needles. Ask your health care provider for help if you need support or information about quitting drugs. Alcohol use Do not drink alcohol if: Your health care provider tells you not to drink. You are pregnant, may be pregnant, or are planning to become pregnant. If you drink alcohol: Limit how much you use to 0-1 drink a day. Limit intake if you are breastfeeding. Be aware of how much alcohol is in your drink. In the [place] one drink equals one 12 oz bottle of beer (355 mL), one 5 oz glass of wine (148 mL), or one 1 oz glass of hard liquor (44 mL). General instructions Schedule regular health, dental, and eye exams. Stay current with your vaccines. Tell your health care provider if: You often feel depressed. You have ever been abused or do not feel safe at home. Summary Adopting a healthy lifestyle and getting preventive care are important in promoting health and wellness. Follow your health care provider's instructions about healthy diet, exercising, and getting tested or screened for diseases. Follow your health care provider's  instructions on monitoring your cholesterol and blood pressure. This information is not intended to replace advice given to you by your health care provider. Make sure you discuss any questions you have with your health care provider. Document Revised: 08/08/2020 Document Reviewed: 05/24/2018 Elsevier Patient Education  2022 [employer]  

## 2021-03-08 NOTE — Progress Notes (Addendum)
Subjective:   Maria Oliver is a 60 y.o. female who presents for Medicare Annual (Subsequent) preventive examination.  I connected with  Maria Oliver on 03/08/21 by a audio enabled telemedicine application and verified that I am speaking with the correct person using two identifiers.  Location of patient: Home Location of provider: Office  Persons participating in visit Maria Oliver (patient) Erick Alley RMA   I discussed the limitations of evaluation and management by telemedicine. The patient expressed understanding and agreed to proceed.   Review of Systems    Defer to PCP       Objective:    There were no vitals filed for this visit. There is no height or weight on file to calculate BMI.  Advanced Directives 02/23/2021 02/22/2021 10/27/2020 04/09/2019 11/28/2017 01/07/2017 11/19/2016  Does Patient Have a Medical Advance Directive? - No No No No No No  Would patient like information on creating a medical advance directive? Yes (Inpatient - patient defers creating a medical advance directive and declines information at this time) - - Yes (MAU/Ambulatory/Procedural Areas - Information given) No - Patient declined No - Patient declined -    Current Medications (verified) Outpatient Encounter Medications as of 03/08/2021  Medication Sig   acetaminophen (TYLENOL) 500 MG tablet Take 500 mg by mouth 4 (four) times daily as needed for headache.   albuterol (VENTOLIN HFA) 108 (90 Base) MCG/ACT inhaler Inhale 2 puffs into the lungs every 6 (six) hours as needed for wheezing or shortness of breath.   fluticasone-salmeterol (ADVAIR HFA) 115-21 MCG/ACT inhaler Inhale 2 puffs into the lungs 2 (two) times daily.   guaiFENesin (MUCINEX) 600 MG 12 hr tablet Take 1 tablet (600 mg total) by mouth 2 (two) times daily. (Patient not taking: Reported on 02/22/2021)   lactose free nutrition (BOOST) LIQD Take 237 mLs by mouth every other day.   lisinopril (ZESTRIL) 20 MG tablet Take 1 tablet (20  mg total) by mouth daily. (Patient taking differently: Take 20 mg by mouth every evening.)   Multiple Vitamin (MULTIVITAMIN WITH MINERALS) TABS tablet Take 1 tablet by mouth daily.   nitrofurantoin, macrocrystal-monohydrate, (MACROBID) 100 MG capsule Take 1 capsule (100 mg total) by mouth 2 (two) times daily.   Vitamin D, Ergocalciferol, (DRISDOL) 1.25 MG (50000 UNIT) CAPS capsule Take 1 capsule (50,000 Units total) by mouth every 7 (seven) days. (Patient not taking: No sig reported)   No facility-administered encounter medications on file as of 03/08/2021.    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 09/09/2016   Generalized headaches    Hernia, inguinal, right    Hypertension    Leg swelling    Past Surgical History:  Procedure Laterality Date   COLON SURGERY  2010   per patient "colon take down"   COLOSTOMY  2010   LAPAROSCOPIC CHOLECYSTECTOMY SINGLE SITE WITH INTRAOPERATIVE CHOLANGIOGRAM N/A 09/09/2016   Procedure: LAPAROSCOPIC CHOLECYSTECTOMY  WITH INTRAOPERATIVE CHOLANGIOGRAM;  Surgeon: Karie Soda, MD;  Location: [facility];  Service: General;  Laterality: N/A;  TUBAL LIGATION     Family History  Problem Relation Age of Onset   Heart disease Mother 82   Heart failure Mother    Asthma Mother    Emphysema Mother    Alzheimer's disease Father 85   Emphysema Brother    Asthma Brother    Colon cancer Neg Hx    Social History   Socioeconomic History   Marital status: Widowed    Spouse  name: Not on file   Number of children: 3   Years of education: Not on file   Highest education level: Not on file  Occupational History   Occupation: disabled  Tobacco Use   Smoking status: Every Day    Packs/day: 1.00    Years: 47.00    Pack years: 47.00    Types: Cigarettes   Smokeless tobacco: Never   Tobacco comments:    1/2 ppd 02/25/20   Vaping Use   Vaping Use: Never used  Substance and Sexual Activity   Alcohol use: No    Alcohol/week: 0.0 standard drinks   Drug use: No   Sexual activity: Not Currently    Birth control/protection: Surgical, None  Other Topics Concern   Not on file  Social History Narrative   Not on file   Social Determinants of Health   Financial Resource Strain: Not on file  Food Insecurity: No Food Insecurity   Worried About Programme researcher, broadcasting/film/video in the Last Year: Never true   Ran Out of Food in the Last Year: Never true  Transportation Needs: No Transportation Needs   Lack of Transportation (Medical): No   Lack of Transportation (Non-Medical): No  Physical Activity: Not on file  Stress: Not on file  Social Connections: Not on file    Tobacco Counseling Ready to quit: Not Answered Counseling given: Not Answered Tobacco comments: 1/2 ppd 02/25/20    Clinical Intake:                 Diabetic? No         Activities of Daily Living In your present state of health, do you have any difficulty performing the following activities: 02/23/2021  Hearing? N  Vision? N  Difficulty concentrating or making decisions? N  Walking or climbing stairs? N  Dressing or bathing? N  Doing errands, shopping? N  Some recent data might be hidden    Patient Care Team: Hoy Register, MD as PCP - General (Family Medicine) Jeani Hawking, MD as Consulting Physician (Gastroenterology) Karie Soda, MD as Consulting Physician (General Surgery) Bradly Bienenstock, MD as Consulting Physician (Orthopedic Surgery) Pleasant, Dennard Schaumann, RN as [facility] Care Management  Indicate any recent Medical Services you may have received from other than [facility] providers in the past year (date may be approximate).     Assessment:   This is a routine wellness examination for Maria Oliver.  Hearing/Vision screen No results found.  Dietary issues and exercise activities discussed:     Goals Addressed   None   Depression Screen PHQ 2/9 Scores 12/10/2020 09/25/2020 05/29/2019 04/09/2019 03/16/2019 02/13/2019 12/11/2018  PHQ - 2 Score 3 2 2 2  0 0 4  PHQ- 9 Score 7 3 3 3  - - 8    Fall Risk Fall Risk  12/10/2020 09/25/2020 03/14/2020 01/16/2020 09/12/2019  Falls in the past year? 0 0 0 0 0  Number falls in past yr: 0 0 0 - 0  Injury with Fall? 0 0 0 - 0  Risk for  fall due to : No Fall Risks - - No Fall Risks -  Follow up - Falls evaluation completed Falls evaluation completed - Falls evaluation completed    FALL RISK PREVENTION PERTAINING TO THE HOME:  Any stairs in or around the home? No  If so, are there any without handrails? No  Home free of loose throw rugs in walkways, pet beds, electrical cords, etc? Yes  Adequate lighting in your home to reduce risk of falls? Yes   ASSISTIVE DEVICES UTILIZED TO PREVENT FALLS:  Life alert? No  Use of a cane, walker or w/c? No  Grab bars in the bathroom? No  Shower chair or bench in shower? No  Elevated toilet seat or a handicapped toilet? No   TIMED UP AND GO:  Was the test performed? No .  Length of time to ambulate 10 feet: N/A sec.     Cognitive Function:        Immunizations Immunization History  Administered Date(s) Administered   Tdap 10/07/2013    TDAP status: Up to date  Flu Vaccine status: Due, Education has been provided regarding the importance of this vaccine. Advised may receive this vaccine at local pharmacy or Health Dept. Aware to provide a copy of the vaccination record if obtained from local pharmacy or Health Dept. Verbalized acceptance and  understanding.  Pneumococcal vaccine status: Due, Education has been provided regarding the importance of this vaccine. Advised may receive this vaccine at local pharmacy or Health Dept. Aware to provide a copy of the vaccination record if obtained from local pharmacy or Health Dept. Verbalized acceptance and understanding.  Covid-19 vaccine status: Declined, Education has been provided regarding the importance of this vaccine but patient still declined. Advised may receive this vaccine at local pharmacy or Health Dept.or vaccine clinic. Aware to provide a copy of the vaccination record if obtained from local pharmacy or Health Dept. Verbalized acceptance and understanding.  Qualifies for Shingles Vaccine? No   Zostavax completed No   Shingrix Completed?: No.    Education has been provided regarding the importance of this vaccine. Patient has been advised to call insurance company to determine out of pocket expense if they have not yet received this vaccine. Advised may also receive vaccine at local pharmacy or Health Dept. Verbalized acceptance and understanding.  Screening Tests Health Maintenance  Topic Date Due   COVID-19 Vaccine (1) Never done   Zoster Vaccines- Shingrix (1 of 2) Never done   COLONOSCOPY (Pts 45-46yrs Insurance coverage will need to be confirmed)  03/04/2019   INFLUENZA VACCINE  Never done   MAMMOGRAM  11/18/2022   TETANUS/TDAP  10/08/2023   PAP SMEAR-Modifier  11/14/2023   Hepatitis C Screening  Completed   HIV Screening  Completed   HPV VACCINES  Aged Out    Health Maintenance  Health Maintenance Due  Topic Date Due   COVID-19 Vaccine (1) Never done   Zoster Vaccines- Shingrix (1 of 2) Never done   COLONOSCOPY (Pts 45-47yrs Insurance coverage will need to be confirmed)  03/04/2019   INFLUENZA VACCINE  Never done    Colorectal cancer screening: Type of screening: Colonoscopy. Completed 03/03/2009. Repeat every 10 years Cologuard Ordered  Mammogram status:  Completed 11/17/20. Repeat every year  Bone Density Done 11/19/20 See Results in Chart  Lung Cancer Screening: (Low Dose CT Chest recommended if Age 39-80 years, 30 pack-year currently smoking OR have quit w/in 15years.) does qualify.   Lung Cancer Screening Referral: Order Placed By Pulmonary  Additional Screening:  Hepatitis C Screening: does qualify; Completed 11/13/20  Vision Screening: Recommended annual ophthalmology exams for early detection of glaucoma and other disorders of the eye. Is the patient up to date with their annual eye exam?  No  Who is the provider or what is the name of the office in which the patient attends annual eye exams? Unknown If pt is not established with a provider, would they like to be referred to a provider to establish care? No .   Dental Screening: Recommended annual dental exams for proper oral hygiene  Community Resource Referral / Chronic Care Management: CRR required this visit?  No   CCM required this visit?  No      Plan:     I have personally reviewed and noted the following in the patient's chart:   Medical and social history Use of alcohol, tobacco or illicit drugs  Current medications and supplements including opioid prescriptions.  Functional ability and status Nutritional status Physical activity Advanced directives List of other physicians Hospitalizations, surgeries, and ER visits in previous 12 months Vitals Screenings to include cognitive, depression, and falls Referrals and appointments  In addition, I have reviewed and discussed with patient certain preventive protocols, quality metrics, and best practice recommendations. A written personalized care plan for preventive services as well as general preventive health recommendations were provided to patient.     Erick Alley, [facility]   03/08/2021   Nurse Notes: Non Face to Face     Maria Oliver , Thank you for taking time to come for your Medicare Wellness Visit.  I appreciate your ongoing commitment to your health goals. Please review the following plan we discussed and let me know if I can assist you in the future.   These are the goals we discussed:  Goals      (THN)Eat Healthy     Timeframe:  Long-Range Goal Priority:  Medium Start Date:  [id number]                           Expected End Date:      [id number]        - drink 6 to 8 glasses of water each day    Why is this important?   When you are ready to manage your nutrition or weight, having a plan and setting goals will help.  Taking small steps to change how you eat and exercise is a good place to start.    Notes:  [id number] Patient is decreasing the sodium in her diet     (THN)Learn and Do Breathing Exercises     Timeframe:  Long-Range Goal Priority:  Medium Start Date:  [id number]                           Expected End Date:  [id number]                     - do exercises in a comfortable position that makes breathing as easy as possible    Why is this important?   Breathing exercises can help lessen the cough that comes with chronic obstructive pulmonary disease.  Doing the exercises will give you more energy.  They will also help you to control your symptoms.    Notes:      (THN)Learn More About My Health     Timeframe:  Long-Range Goal Priority:  [employer]  Date:     [id number]                        Expected End Date:   [id number]               - ask questions - bring a list of my medicines to the visit - speak up when I don't understand    Why is this important?   The best way to learn about your health and care is by talking to the doctor and nurse.  They will answer your questions and give you information in the way that you like best.    Notes:      (THN)Manage Fatigue (Tiredness-COPD)     Timeframe:  Long-Range Goal Priority:  Medium Start Date:   05/22/2020                          Expected End Date:    [id number]                      - eat healthy - get  outdoors every day (weather permitting)    Why is this important?   Feeling tired or worn out is a common symptom of COPD (chronic obstructive pulmonary disease).  Learning when you feel your best and when you need rest is important.  Managing the tiredness (fatigue) will help you be active and enjoy life.     Notes:      (THN)Quit smoking / using tobacco     Timeframe:  Long-Range Goal Priority:  Medium Start Date:    15520802                         Expected End Date:   23361224                   Patient is not ready to stop Patient has received information on quit smoking     (THN)Track and Manage My Symptoms     Timeframe:  Short-Term Goal Priority:  High Start Date:   [id number]                         Expected End Date:  11021117                       - develop a rescue plan - eliminate symptom triggers at home - follow rescue plan if symptoms flare-up - keep follow-up appointments    Why is this important?   Tracking your symptoms and other information about your health helps your doctor plan your care.  Write down the symptoms, the time of day, what you were doing and what medicine you are taking.  You will soon learn how to manage your symptoms.     Notes:  [id number] Patient is monitoring her symptoms closely     (THN)Track and Manage My Triggers     Timeframe:  Long-Range Goal Priority:  High Start Date:      03013143                       Expected End Date:    [id number]                  Follow Up Date [id number]   - avoid second hand smoke - eliminate  smoking in my home - limit outdoor activity during cold weather - listen for public air quality announcements every day    Why is this important?   Triggers are activities or things, like tobacco smoke or cold weather, that make your COPD (chronic obstructive pulmonary disease) flare-up.  Knowing these triggers helps you plan how to stay away from them.  When you cannot remove them, you can learn how to manage  them.     Notes:  Patient has received information on stop smoklng Patient at this time is not ready to stop 09/25/2020 Patient used her COPD action plan when symptoms occurred to prevent exacerbation        This is a list of the screening recommended for you and due dates:  Health Maintenance  Topic Date Due   Colon Cancer Screening  03/04/2019   COVID-19 Vaccine (1) 03/24/2021*   Zoster (Shingles) Vaccine (1 of 2) 06/07/2021*   Flu Shot  09/11/2021*   Mammogram  11/18/2022   Tetanus Vaccine  10/08/2023   Pap Smear  11/14/2023   Hepatitis C Screening: USPSTF Recommendation to screen - Ages 18-62 yo.  Completed   HIV Screening  Completed   HPV Vaccine  Aged Out  *Topic was postponed. The date shown is not the original due date.

## 2021-03-11 ENCOUNTER — Other Ambulatory Visit: Payer: Self-pay

## 2021-03-11 ENCOUNTER — Ambulatory Visit: Payer: Medicare Other | Attending: Family Medicine | Admitting: Family Medicine

## 2021-03-11 VITALS — BP 145/90 | HR 72 | Ht 64.0 in | Wt 148.2 lb

## 2021-03-11 DIAGNOSIS — I10 Essential (primary) hypertension: Secondary | ICD-10-CM | POA: Diagnosis not present

## 2021-03-11 DIAGNOSIS — R911 Solitary pulmonary nodule: Secondary | ICD-10-CM | POA: Diagnosis not present

## 2021-03-11 DIAGNOSIS — R109 Unspecified abdominal pain: Secondary | ICD-10-CM | POA: Diagnosis not present

## 2021-03-11 LAB — POCT URINALYSIS DIP (CLINITEK)
Bilirubin, UA: NEGATIVE
Blood, UA: NEGATIVE
Glucose, UA: NEGATIVE mg/dL
Ketones, POC UA: NEGATIVE mg/dL
Leukocytes, UA: NEGATIVE
Nitrite, UA: NEGATIVE
POC PROTEIN,UA: NEGATIVE
Spec Grav, UA: 1.025 (ref 1.010–1.025)
Urobilinogen, UA: 0.2 E.U./dL
pH, UA: 7 (ref 5.0–8.0)

## 2021-03-11 NOTE — Progress Notes (Signed)
Recheck for UTI Discuss BP medications.

## 2021-03-11 NOTE — Progress Notes (Signed)
Subjective:  Patient ID: Maria Oliver, female    DOB: 12-Dec-1960  Age: 60 y.o. MRN: [id number]  CC: Hypertension   HPI KYRSTYN GREEAR is a 60 y.o. year old female with a history of COPD, tobacco abuse, hypertension, hospitalized from 02/22/21 through 03/13/2021 for COVID-19 virus infection and was noticed to have had an episode of Syncope lasting about 5 minutes after she had been suffering flulike symptoms.  Treated with IV fluids, no COVID treatment provided per notes. Echocardiogram revealed normal EF. She did have an incidental finding of pulmonary nodules bilaterally.  2 weeks ago she presented to the UC with dysuria and was prescribed Macrobid.  She was later called to switch the antibiotic to Bactrim due to urine culture revealing Klebsiella pneumonia resistant to Macrobid.  Interval History: She received Verapamil from the Pharmacy instead of Macrobid and took that for 3 days before she realized it then stopped it. She later realized it had Earna Coder on it and not her name. She has L flank pain and would like to see if the UTI is completely resolved.  She has no frequency, urgency, abdominal pain, fever. She has urinary incontinence and uses depends but no longer has Medicaid to pay for her incontinence supplies.  During hospitalization for COVID she did have hypotension and her antihypertensive was placed on hold.  After discharge she decided to resume her lisinopril 20 mg and noticed that her systolic blood pressure dropped to 90 hence she discontinued it. 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 09/09/2016   Generalized headaches    Hernia, inguinal, right     Hypertension    Leg swelling     Past Surgical History:  Procedure Laterality Date   COLON SURGERY  2010   per patient "colon take down"   COLOSTOMY  2010   LAPAROSCOPIC CHOLECYSTECTOMY SINGLE SITE WITH INTRAOPERATIVE CHOLANGIOGRAM N/A 09/09/2016   Procedure: LAPAROSCOPIC CHOLECYSTECTOMY  WITH INTRAOPERATIVE CHOLANGIOGRAM;  Surgeon: Karie Soda, MD;  Location: [facility];  Service: General;  Laterality: N/A;   TUBAL LIGATION      Family History  Problem Relation Age of Onset   Heart disease Mother 97   Heart failure Mother    Asthma Mother    Emphysema Mother    Alzheimer's disease Father 55   Emphysema Brother    Asthma Brother    Colon cancer Neg Hx     Allergies  Allergen Reactions   Aspirin Other (See Comments)    REACTION: GI upset/Hernia    Ciprofloxacin Other (See Comments)    Tendonitis, joint pain, and nausea.  "Interfered with her HCTZ" (also)   Codeine Nausea And Vomiting and Other (See Comments)    (Also) GI upset/Hernia (CAN TOLERATE DILAUDID & MORPHINE)   Eggs Or Egg-Derived Products Nausea Only    Stomach pains   Fentanyl Nausea Only    Tolerates Dilaudid or morphine much better   Hydrocodone Nausea And Vomiting   Lactose Intolerance (Gi) Nausea And Vomiting and Other (See Comments)    (Also) vertigo   Oxycodone Nausea And Vomiting    Tolerates hydromorphone better   Penicillins Rash  Has patient had a PCN reaction causing immediate rash, facial/tongue/throat swelling, SOB or lightheadedness with hypotension: Yes Has patient had a PCN reaction causing severe rash involving mucus membranes or skin necrosis: No Has patient had a PCN reaction that required hospitalization No Has patient had a PCN reaction occurring within the last 10 years: No If all of the above answers are "NO", then may proceed with Cephalosporin use.    Tape Rash    Please try paper tape    Outpatient Medications Prior to Visit  Medication Sig Dispense Refill   albuterol  (VENTOLIN HFA) 108 (90 Base) MCG/ACT inhaler Inhale 2 puffs into the lungs every 6 (six) hours as needed for wheezing or shortness of breath. 8.5 g 0   Multiple Vitamin (MULTIVITAMIN WITH MINERALS) TABS tablet Take 1 tablet by mouth daily.     acetaminophen (TYLENOL) 500 MG tablet Take 500 mg by mouth 4 (four) times daily as needed for headache. (Patient not taking: Reported on 03/11/2021)     fluticasone-salmeterol (ADVAIR HFA) 115-21 MCG/ACT inhaler Inhale 2 puffs into the lungs 2 (two) times daily. (Patient not taking: Reported on 03/11/2021) 1 each 12   guaiFENesin (MUCINEX) 600 MG 12 hr tablet Take 1 tablet (600 mg total) by mouth 2 (two) times daily. (Patient not taking: No sig reported) 30 tablet 0   lactose free nutrition (BOOST) LIQD Take 237 mLs by mouth every other day. (Patient not taking: Reported on 03/11/2021)     lisinopril (ZESTRIL) 20 MG tablet Take 1 tablet (20 mg total) by mouth daily. (Patient not taking: No sig reported) 90 tablet 3   Vitamin D, Ergocalciferol, (DRISDOL) 1.25 MG (50000 UNIT) CAPS capsule Take 1 capsule (50,000 Units total) by mouth every 7 (seven) days. (Patient not taking: Reported on 03/11/2021) 16 capsule 0   No facility-administered medications prior to visit.     ROS Review of Systems  Constitutional:  Negative for activity change, appetite change and fatigue.  HENT:  Negative for congestion, sinus pressure and sore throat.   Eyes:  Negative for visual disturbance.  Respiratory:  Negative for cough, chest tightness, shortness of breath and wheezing.   Cardiovascular:  Negative for chest pain and palpitations.  Gastrointestinal:  Negative for abdominal distention, abdominal pain and constipation.  Endocrine: Negative for polydipsia.  Genitourinary:  Positive for flank pain. Negative for dysuria and frequency.  Musculoskeletal:  Negative for arthralgias and back pain.  Skin:  Negative for rash.  Neurological:  Negative for tremors, light-headedness and  numbness.  Hematological:  Does not bruise/bleed easily.  Psychiatric/Behavioral:  Negative for agitation and behavioral problems.    Objective:  BP (!) 145/90   Pulse 72   Ht 5\' 4"  (1.626 m)   Wt 148 lb 3.2 oz (67.2 kg)   SpO2 97%   BMI 25.44 kg/m   BP/Weight 03/11/2021 02/27/2021 02/24/2021  Systolic BP 145 134 150  Diastolic BP 90 77 92  Wt. (Lbs) 148.2 - -  BMI 25.44 - -      Physical Exam Constitutional:      Appearance: She is well-developed.  Cardiovascular:     Rate and Rhythm: Normal rate.     Heart sounds: Normal heart sounds. No murmur heard. Pulmonary:     Effort: Pulmonary effort is normal.     Breath sounds: Normal breath sounds. No wheezing or rales.  Chest:     Chest wall: No tenderness.  Abdominal:     General: Bowel sounds are normal. There is  no distension.     Palpations: Abdomen is soft. There is no mass.     Tenderness: There is no abdominal tenderness. There is no right CVA tenderness or left CVA tenderness.     Hernia: A hernia is present.  Musculoskeletal:        General: Normal range of motion.     Right lower leg: No edema.     Left lower leg: No edema.  Neurological:     Mental Status: She is alert and oriented to person, place, and time.  Psychiatric:        Mood and Affect: Mood normal.    CMP Latest Ref Rng & Units 02/24/2021 02/23/2021 02/22/2021  Glucose 70 - 99 mg/dL 87 - 505(L)  BUN 8 - 23 mg/dL 7(L) - 16  Creatinine 9.76 - 1.00 mg/dL 7.34 1.93 7.90(W)  Sodium 135 - 145 mmol/L 139 - 138  Potassium 3.5 - 5.1 mmol/L 3.2(L) - 4.1  Chloride 98 - 111 mmol/L 104 - 104  CO2 22 - 32 mmol/L 28 - 22  Calcium 8.9 - 10.3 mg/dL 4.0(X) - 9.1  Total Protein 6.5 - 8.1 g/dL 7.3(Z) - 7.0  Total Bilirubin 0.3 - 1.2 mg/dL 0.4 - 0.5  Alkaline Phos 38 - 126 U/L 57 - 70  AST 15 - 41 U/L 24 - 28  ALT 0 - 44 U/L 24 - 25    Lipid Panel     Component Value Date/Time   CHOL 184 06/28/2017 1157   TRIG 112 06/28/2017 1157   HDL 52 06/28/2017 1157    CHOLHDL 3.5 06/28/2017 1157   CHOLHDL 4.7 05/09/2013 1445   VLDL 32 05/09/2013 1445   LDLCALC 110 (H) 06/28/2017 1157    CBC    Component Value Date/Time   WBC 6.8 02/24/2021 0224   RBC 4.87 02/24/2021 0224   HGB 13.8 02/24/2021 0224   HGB 14.3 11/02/2017 1621   HCT 42.0 02/24/2021 0224   HCT 44.0 11/02/2017 1621   PLT 217 02/24/2021 0224   PLT 375 11/02/2017 1621   MCV 86.2 02/24/2021 0224   MCV 87 11/02/2017 1621   MCH 28.3 02/24/2021 0224   MCHC 32.9 02/24/2021 0224   RDW 14.0 02/24/2021 0224   RDW 14.6 11/02/2017 1621   LYMPHSABS 2.5 02/24/2021 0224   LYMPHSABS 2.5 11/02/2017 1621   MONOABS 0.8 02/24/2021 0224   EOSABS 0.0 02/24/2021 0224   EOSABS 0.3 11/02/2017 1621   BASOSABS 0.0 02/24/2021 0224   BASOSABS 0.0 11/02/2017 1621    Lab Results  Component Value Date   HGBA1C 5.6 12/10/2020    Assessment & Plan:  1. Left flank pain UA negative for UTI Likely musculoskeletal - POCT URINALYSIS DIP (CLINITEK)  2. Essential hypertension Slightly elevated blood pressure but in the light of hypotension we will continue to hold off on lisinopril Keep record of ambulatory blood pressure readings I will see her back in 1 month and if blood pressure is still elevated we will reinitiate lisinopril at a very low dose Counseled on blood pressure goal of less than 130/80, low-sodium, DASH diet, medication compliance, 150 minutes of moderate intensity exercise per week. Discussed medication compliance, adverse effects.   3. Lung nodule Follow-up recommended CT chest lung cancer screen ordered by pulmonary She does have a pulmonologist and has been advised to follow-up with him regarding this.  Health Care Maintenance: Counseled on the need to receive a COVID-19 vaccine No orders of the defined types were placed in this  encounter.   Follow-up: Return in about 1 month (around 04/10/2021) for Hypertension.       Hoy Register, MD, FAAFP. [facility] [place], [place] [phone]   03/11/2021, 6:00 PM

## 2021-03-30 ENCOUNTER — Ambulatory Visit: Payer: Self-pay | Admitting: *Deleted

## 2021-04-03 ENCOUNTER — Other Ambulatory Visit: Payer: Self-pay | Admitting: *Deleted

## 2021-04-03 NOTE — Patient Outreach (Addendum)
[facility]) Care Management  [facility] Care Manager  04/03/2021   Maria Oliver 1960-07-19 [id number]  RN Health Coach telephone call to patient.  [employer] compliance verified. Per patient she is doing better. Patient is recovering her COVID. She stated she is feeling very fatigue. Her appetite is good. She is still having coughing episodes. Patient has agreed to follow up outreach calls.   Encounter Medications:  Outpatient Encounter Medications as of 04/03/2021  Medication Sig Note   acetaminophen (TYLENOL) 500 MG tablet Take 500 mg by mouth 4 (four) times daily as needed for headache. (Patient not taking: Reported on 03/11/2021)    albuterol (VENTOLIN HFA) 108 (90 Base) MCG/ACT inhaler Inhale 2 puffs into the lungs every 6 (six) hours as needed for wheezing or shortness of breath.    fluticasone-salmeterol (ADVAIR HFA) 115-21 MCG/ACT inhaler Inhale 2 puffs into the lungs 2 (two) times daily. (Patient not taking: Reported on 03/11/2021)    guaiFENesin (MUCINEX) 600 MG 12 hr tablet Take 1 tablet (600 mg total) by mouth 2 (two) times daily. (Patient not taking: No sig reported) 02/22/2021: [facility] did not provide this to pt when she picked up at the drive thru   lactose free nutrition (BOOST) LIQD Take 237 mLs by mouth every other day. (Patient not taking: Reported on 03/11/2021)    lisinopril (ZESTRIL) 20 MG tablet Take 1 tablet (20 mg total) by mouth daily. (Patient not taking: No sig reported)    Multiple Vitamin (MULTIVITAMIN WITH MINERALS) TABS tablet Take 1 tablet by mouth daily.    Vitamin D, Ergocalciferol, (DRISDOL) 1.25 MG (50000 UNIT) CAPS capsule Take 1 capsule (50,000 Units total) by mouth every 7 (seven) days. (Patient not taking: Reported on 03/11/2021)    No facility-administered encounter medications on file as of 04/03/2021.    Functional Status:  In your present state of health, do you have any difficulty performing the following activities: 03/08/2021  02/23/2021  Hearing? N N  Vision? Y N  Difficulty concentrating or making decisions? Y N  Comment Remembers things she have read -  Walking or climbing stairs? Y N  Dressing or bathing? N N  Doing errands, shopping? N N  Some recent data might be hidden    Fall/Depression Screening: Fall Risk  04/03/2021 03/11/2021 03/08/2021  Falls in the past year? 0 0 0  Number falls in past yr: 0 0 0  Injury with Fall? 0 0 0  Risk for fall due to : - - No Fall Risks  Follow up Falls evaluation completed - Falls evaluation completed   PHQ 2/9 Scores 03/11/2021 03/08/2021 12/10/2020 09/25/2020 05/29/2019 04/09/2019 03/16/2019  PHQ - 2 Score 4 3 3 2 2 2  0  PHQ- 9 Score 11 13 7 3 3 3  -    Assessment:   Care Plan Care Plan : COPD (Adult)  Updates made by , RN since 04/03/2021 12:00 AM     Problem: Psychological Adjustment to Diagnosis (COPD)   Priority: Medium  Onset Date: 03/14/2020     Long-Range Goal: Adjustment to Disease Achieved   Start Date: 03/14/2020  Expected End Date: 07/13/2021  This Visit's Progress: On track  Recent Progress: On track  Priority: Medium  Note:   Evidence-based guidance:  Explore the patient and family's understanding of the disease; use open-ended questions to encourage patient and family to share what is important to them.  Assess and provide support around experience of loss and lifestyle adjustments, such as loss of  function, roles, social ties, independence and hobbies.  Support adjustment to ?onew normal? with focus on maintaining daily life as closely as possible to life before chronic obstructive pulmonary disease (COPD) diagnosis.  Express empathy; listen actively by encouraging the patient and family to express feelings, concerns and fears; ask questions and encourage open communication regarding embarrassing or disturbing topics.  Assess for factors that may impact coping or adjustment, such as a preexisting mental health condition,  prognosis, lack of social support, debilitating disease or financial difficulty that include no or insufficient insurance coverage.  Assess and address fear or anxiety related to dyspnea or perceived stigma of a noninfectious cough.  Prepare for re-entry into work, school and social activities; establish links or collaborate with mental health resources in the community, such as peer support or advocacy groups.  Assess anxiety, feelings of panic when breathing becomes labored, as well as impact on family/caregiver; consider cognitive behavioral therapy, deep breathing, meditation, visualization, photo or light therapy.  Encourage advanced care planning discussion to clarify goals of care; palliative care or hospice may be considered for advanced COPD patients if it aligns with patient's goals of care.  Explore common risk factors for depression, such as personal or family history of depression, substance use and stressors that include missed work, losing ability to do what patient was used to doing, changes in appearance, physical   or sexual abuse.  Destigmatize depression by presenting it as a problem requiring treatment, rather than a personal weakness.  Use a validated screening tool to identify level of suicide risk. If at risk, develop safety plan and implement additional safety measures based on level of risk, such as behavioral health referral or immediate assessment.   Notes:     Task: Support Psychosocial Response to Chronic Obstructive Pulmonary Disease   Due Date: 07/13/2021  Note:   Care Management Activities:    - complementary therapy use encouraged - decision-making supported - emotional support provided - verbalization of feelings encouraged    Notes:  Patient had test and noted spot on lung. To be followed up in 6 months by pulmonary    Problem: Disease Progression (COPD)   Priority: Medium  Onset Date: 03/14/2020     Long-Range Goal: Disease Progression Minimized or  Managed   Start Date: 03/14/2020  Expected End Date: 07/13/2021  This Visit's Progress: On track  Recent Progress: On track  Priority: Medium  Note:   Evidence-based guidance:  Identify current smoking/tobacco use; provide smoking cessation intervention.  Assess symptom control by the frequency and type of symptoms, reliever use and activity limitation at every encounter.  Assess risk for exacerbation (flare up) by evaluating spirometry, pulse oximetry, reliever use, presentation of symptoms and activity limitation; anticipate treatment adjustment based on risks and resources.  Develop and/or review and reinforce use of COPD rescue (action) plan even when symptoms are controlled or infrequent.  Ask patient to bring inhaler to all visits; assess and reinforce correct technique; address barriers to proper inhaler use, such as older age, use of multiple devices and lack of understanding.   Identify symptom triggers, such as smoking, virus, weather change, emotional upset, exercise, obesity and environmental allergen; consider reduction of work-exposure versus elimination to avoid compromising employment.  Correlate presentation to comorbidity, such as diabetes, heart failure, obstructive sleep apnea, depression and anxiety, which may worsen symptoms.  Prepare for individualized pharmacologic therapy that may include LABA (long-acting beta-2 agonist), LAMA (long-acting muscarinic antagonist), SABA (short-acting beta-2 agonist) oral or inhaled corticosteroid.  Promote participation in pulmonary rehabilitation for breathing exercises, skills training, improved exercise capacity, mood and quality of life; address barriers to participation.  Promote physical activity or exercise to improve or maintain exercise capacity, based on tolerance that may include walking, water exercise, cycling or limb muscle strength training.  Promote use of energy conservation and activity pacing techniques.  Promote use of  breathing and coughing techniques, such as inspiratory muscle training, pursed-lip breathing, diaphragmatic breathing, pranayama yoga breathing or huff cough.  Screen for malnutrition risk factors, such as unintentional weight loss and poor oral intake; refer to dietitian if identified.  Consider recommendation for oral drink supplement or multivitamin and mineral supplements if suspect inadequate oral intake or micronutrient deficiencies.   Screen for obstructive sleep apnea; prepare patient for polysomnography based on risk and presentation.  Prepare patient for use of long-term oxygen and noninvasive ventilation to relieve hypercapnia, hypoxemia, obstructive sleep apnea and reduce work of breathing.  Prepare patient with worsening disease for surgical interventions that may include bronchoscopy, lung volume reduction surgery, bullectomy or lung transplantation.   Notes:     Task: Alleviate Barriers to COPD Management   Due Date: 07/13/2021  Note:   Care Management Activities:    - activity or exercise based on tolerance encouraged - breathing techniques encouraged - inhaler technique observed - medication-adherence assessment completed - rescue (action) plan reviewed - smoking counseling provided - self-awareness of symptom triggers encouraged    Notes:  Discussed with patient the importance of rinsing mouth with use of inhalers    Problem: Symptom Exacerbation (COPD)   Priority: High  Onset Date: 03/14/2020     Goal: Symptom Exacerbation Prevented or Minimized   Start Date: 03/14/2020  Expected End Date: 07/13/2021  This Visit's Progress: On track  Recent Progress: On track  Priority: Medium  Note:   Evidence-based guidance:  Monitor for signs of respiratory infection, including changes in sputum color, volume and thickness, as well as fever.  Encourage infection prevention strategies that may include prophylactic antibiotic therapy for patients with history of frequent  exacerbations or antibiotic administration during exacerbation based on presentation, risk and benefit.  Encourage receipt of influenza and pneumococcal vaccine.   Prepare patient for use of home long-term oxygen therapy in presence of sever resting hypoxemia.  Prepare patients for laboratory studies or diagnostic exams, such as spirometry, pulse oximetry and arterial blood gas based on current symptoms, risk factors and presentation.  Assess barriers and manage adherence, including inhaler technique and persistent trigger exposure; encourage adherence, even when symptoms are controlled or infrequent.  Assess and monitor for signs/symptoms of psychosocial concerns, such as shortness of breath-anxiety cycle or depression that may impact stability of symptoms.  Identify economic resources, sociocultural beliefs, social factors and health literacy that may interfere with adherence.  Promote lifestyle changes when needed, including regular physical activity based on tolerance, weight loss, healthy eating and stress management.  Consider referral to nurse or community health worker or home-visiting program for intensive support and education (disease-management program).  Increase frequency of follow-up following exacerbation or hospitalization; consider transition of care interventions, such as hospital visit, home visit, telephone follow-up, review of discharge summary and resource referrals.   Notes:  [id number] patient had episode on 03/25 of coughing and thick sputum. Started on Atbx and prednisone. Symptoms better today    Task: Identify and Minimize Risk of COPD Exacerbation   Due Date: 07/13/2021  Note:   Care Management Activities:    - breathing techniques encouraged -  healthy lifestyle promoted - rescue (action) plan reviewed - signs/symptoms of infection reviewed - signs/symptoms of worsening disease assessed - symptom triggers identified - treatment plan reviewed    Notes:        Goals Addressed             This Visit's Progress    (THN)Eat Healthy   On track    Timeframe:  Long-Range Goal Priority:  Medium Start Date:  [id number]                           Expected End Date:      [id number] Follow up date [id number]     - drink 6 to 8 glasses of water each day    Why is this important?   When you are ready to manage your nutrition or weight, having a plan and setting goals will help.  Taking small steps to change how you eat and exercise is a good place to start.    Notes:  [id number] Patient is decreasing the sodium in her diet [id number] Patient appetite is good     (THN)Learn and Do Breathing Exercises   On track    Timeframe:  Long-Range Goal Priority:  Medium Start Date:  28413244                           Expected End Date:  01027253 Follow up date [id number]                  - do exercises in a comfortable position that makes breathing as easy as possible    Why is this important?   Breathing exercises can help lessen the cough that comes with chronic obstructive pulmonary disease.  Doing the exercises will give you more energy.  They will also help you to control your symptoms.    Notes:       (THN)Learn More About My Health   On track    Timeframe:  Long-Range Goal Priority:  High Start Date:     [id number]                        Expected End Date:   75643329 Follow up  date [id number]             - ask questions - bring a list of my medicines to the visit - speak up when I don't understand    Why is this important?   The best way to learn about your health and care is by talking to the doctor and nurse.  They will answer your questions and give you information in the way that you like best.    Notes:  [id number] RN sent COPD tips education      [facility] Fatigue (Tiredness-COPD)   On track    Timeframe:  Long-Range Goal Priority:  Medium Start Date:   05/22/2020                          Expected End Date:    09323557 Follow up date  32202542                   - eat healthy - get outdoors every day (weather permitting)    Why is this important?   Feeling tired or worn out is a common symptom of COPD (chronic obstructive  pulmonary disease).  Learning when you feel your best and when you need rest is important.  Managing the tiredness (fatigue) will help you be active and enjoy life.     Notes:  [id number] Patient is recovering from Covid and still is very fatigue     (THN)Quit smoking / using tobacco   On track    Timeframe:  Long-Range Goal Priority:  Medium Start Date:    32951884                         Expected End Date:   16606301                   Patient is not ready to stop Patient has received information on quit smoking [id number] not ready to stop smoking     (THN)Track and Manage My Symptoms   On track    Timeframe:  Short-Term Goal Priority:  High Start Date:   55732202                         Expected End Date:  54270623 [id number]                    - develop a rescue plan - eliminate symptom triggers at home - follow rescue plan if symptoms flare-up - keep follow-up appointments    Why is this important?   Tracking your symptoms and other information about your health helps your doctor plan your care.  Write down the symptoms, the time of day, what you were doing and what medicine you are taking.  You will soon learn how to manage your symptoms.     Notes:  [id number] Patient is monitoring her symptoms closely [id number] Patient is monitoring for any symptoms of COPD exacerbation     (THN)Track and Manage My Triggers   On track    Timeframe:  Long-Range Goal Priority:  High Start Date:      54627035                       Expected End Date:    00938182         Follow Up Date [id number]   - avoid second hand smoke - eliminate smoking in my home - limit outdoor activity during cold weather - listen for public air quality announcements every day    Why is this important?   Triggers are  activities or things, like tobacco smoke or cold weather, that make your COPD (chronic obstructive pulmonary disease) flare-up.  Knowing these triggers helps you plan how to stay away from them.  When you cannot remove them, you can learn how to manage them.     Notes:  Patient has received information on stop smoklng Patient at this time is not ready to stop 09/25/2020 Patient used her COPD action plan when symptoms occurred to prevent exacerbation [id number] Patient is monitoring her symptoms and weather changes        Plan:  Follow-up: Follow-up in 3 month(s) RN sent COPD quick tips Patient will monitor COPD exacerbation for symptoms RN sent  update assessment to PCP  Gean Maidens BSN RN [facility] Care Management [phone]

## 2021-04-03 NOTE — Patient Instructions (Signed)
Goals Addressed             This Visit's Progress    (THN)Eat Healthy   On track    Timeframe:  Long-Range Goal Priority:  Medium Start Date:  37902409                           Expected End Date:      73532992 Follow up date 42683419     - drink 6 to 8 glasses of water each day    Why is this important?   When you are ready to manage your nutrition or weight, having a plan and setting goals will help.  Taking small steps to change how you eat and exercise is a good place to start.    Notes:  [id number] Patient is decreasing the sodium in her diet [id number] Patient appetite is good     (THN)Learn and Do Breathing Exercises   On track    Timeframe:  Long-Range Goal Priority:  Medium Start Date:  40814481                           Expected End Date:  85631497 Follow up date 02637858                  - do exercises in a comfortable position that makes breathing as easy as possible    Why is this important?   Breathing exercises can help lessen the cough that comes with chronic obstructive pulmonary disease.  Doing the exercises will give you more energy.  They will also help you to control your symptoms.    Notes:       (THN)Learn More About My Health   On track    Timeframe:  Long-Range Goal Priority:  High Start Date:     85027741                        Expected End Date:   28786767 Follow up  date [id number]             - ask questions - bring a list of my medicines to the visit - speak up when I don't understand    Why is this important?   The best way to learn about your health and care is by talking to the doctor and nurse.  They will answer your questions and give you information in the way that you like best.    Notes:  [id number] RN sent COPD tips education      [facility] Fatigue (Tiredness-COPD)   On track    Timeframe:  Long-Range Goal Priority:  Medium Start Date:   05/22/2020                          Expected End Date:    76546503 Follow up date  54656812                   - eat healthy - get outdoors every day (weather permitting)    Why is this important?   Feeling tired or worn out is a common symptom of COPD (chronic obstructive pulmonary disease).  Learning when you feel your best and when you need rest is important.  Managing the tiredness (fatigue) will help you be active and enjoy life.     Notes:  [id number] Patient is  recovering from Covid and still is very fatigue     (THN)Quit smoking / using tobacco   On track    Timeframe:  Long-Range Goal Priority:  Medium Start Date:    25427062                         Expected End Date:   37628315                   Patient is not ready to stop Patient has received information on quit smoking [id number] not ready to stop smoking     (THN)Track and Manage My Symptoms   On track    Timeframe:  Short-Term Goal Priority:  High Start Date:   71062694                         Expected End Date:  85462703 [id number]                    - develop a rescue plan - eliminate symptom triggers at home - follow rescue plan if symptoms flare-up - keep follow-up appointments    Why is this important?   Tracking your symptoms and other information about your health helps your doctor plan your care.  Write down the symptoms, the time of day, what you were doing and what medicine you are taking.  You will soon learn how to manage your symptoms.     Notes:  [id number] Patient is monitoring her symptoms closely [id number] Patient is monitoring for any symptoms of COPD exacerbation     (THN)Track and Manage My Triggers   On track    Timeframe:  Long-Range Goal Priority:  High Start Date:      17510258                       Expected End Date:    52778242         Follow Up Date [id number]   - avoid second hand smoke - eliminate smoking in my home - limit outdoor activity during cold weather - listen for public air quality announcements every day    Why is this important?   Triggers are  activities or things, like tobacco smoke or cold weather, that make your COPD (chronic obstructive pulmonary disease) flare-up.  Knowing these triggers helps you plan how to stay away from them.  When you cannot remove them, you can learn how to manage them.     Notes:  Patient has received information on stop smoklng Patient at this time is not ready to stop 09/25/2020 Patient used her COPD action plan when symptoms occurred to prevent exacerbation [id number] Patient is monitoring her symptoms and weather changes

## 2021-04-22 ENCOUNTER — Emergency Department (HOSPITAL_COMMUNITY): Payer: Medicare Other

## 2021-04-22 ENCOUNTER — Emergency Department (HOSPITAL_COMMUNITY)
Admission: EM | Admit: 2021-04-22 | Discharge: 2021-04-22 | Disposition: A | Discharge status: Home / Self Care | Payer: Medicare Other | Attending: Emergency Medicine | Admitting: Emergency Medicine

## 2021-04-22 ENCOUNTER — Encounter (HOSPITAL_COMMUNITY): Payer: Self-pay | Admitting: Emergency Medicine

## 2021-04-22 DIAGNOSIS — Z79899 Other long term (current) drug therapy: Secondary | ICD-10-CM | POA: Diagnosis not present

## 2021-04-22 DIAGNOSIS — Z20822 Contact with and (suspected) exposure to covid-19: Secondary | ICD-10-CM | POA: Diagnosis not present

## 2021-04-22 DIAGNOSIS — J449 Chronic obstructive pulmonary disease, unspecified: Secondary | ICD-10-CM | POA: Diagnosis not present

## 2021-04-22 DIAGNOSIS — M545 Low back pain, unspecified: Secondary | ICD-10-CM | POA: Insufficient documentation

## 2021-04-22 DIAGNOSIS — F17210 Nicotine dependence, cigarettes, uncomplicated: Secondary | ICD-10-CM | POA: Insufficient documentation

## 2021-04-22 DIAGNOSIS — M549 Dorsalgia, unspecified: Secondary | ICD-10-CM | POA: Diagnosis present

## 2021-04-22 DIAGNOSIS — I10 Essential (primary) hypertension: Secondary | ICD-10-CM | POA: Insufficient documentation

## 2021-04-22 LAB — CBC WITH DIFFERENTIAL/PLATELET
Abs Immature Granulocytes: 0.02 10*3/uL (ref 0.00–0.07)
Basophils Absolute: 0.1 10*3/uL (ref 0.0–0.1)
Basophils Relative: 1 %
Eosinophils Absolute: 0.3 10*3/uL (ref 0.0–0.5)
Eosinophils Relative: 3 %
HCT: 46.6 % — ABNORMAL HIGH (ref 36.0–46.0)
Hemoglobin: 14.8 g/dL (ref 12.0–15.0)
Immature Granulocytes: 0 %
Lymphocytes Relative: 38 %
Lymphs Abs: 2.9 10*3/uL (ref 0.7–4.0)
MCH: 28.2 pg (ref 26.0–34.0)
MCHC: 31.8 g/dL (ref 30.0–36.0)
MCV: 88.9 fL (ref 80.0–100.0)
Monocytes Absolute: 0.6 10*3/uL (ref 0.1–1.0)
Monocytes Relative: 8 %
Neutro Abs: 3.8 10*3/uL (ref 1.7–7.7)
Neutrophils Relative %: 50 %
Platelets: 337 10*3/uL (ref 150–400)
RBC: 5.24 MIL/uL — ABNORMAL HIGH (ref 3.87–5.11)
RDW: 14.3 % (ref 11.5–15.5)
WBC: 7.6 10*3/uL (ref 4.0–10.5)
nRBC: 0 % (ref 0.0–0.2)

## 2021-04-22 LAB — COMPREHENSIVE METABOLIC PANEL
ALT: 19 U/L (ref 0–44)
AST: 31 U/L (ref 15–41)
Albumin: 3.7 g/dL (ref 3.5–5.0)
Alkaline Phosphatase: 63 U/L (ref 38–126)
Anion gap: 8 (ref 5–15)
BUN: 7 mg/dL — ABNORMAL LOW (ref 8–23)
CO2: 26 mmol/L (ref 22–32)
Calcium: 8.9 mg/dL (ref 8.9–10.3)
Chloride: 104 mmol/L (ref 98–111)
Creatinine, Ser: 0.73 mg/dL (ref 0.44–1.00)
GFR, Estimated: >60 mL/min (ref >60)
Glucose, Bld: 84 mg/dL (ref 70–99)
Potassium: 4.8 mmol/L (ref 3.5–5.1)
Sodium: 138 mmol/L (ref 135–145)
Total Bilirubin: 0.8 mg/dL (ref 0.3–1.2)
Total Protein: 5.9 g/dL — ABNORMAL LOW (ref 6.5–8.1)

## 2021-04-22 LAB — URINALYSIS, ROUTINE W REFLEX MICROSCOPIC
Bilirubin Urine: NEGATIVE
Glucose, UA: NEGATIVE mg/dL
Hgb urine dipstick: NEGATIVE
Ketones, ur: NEGATIVE mg/dL
Nitrite: NEGATIVE
Protein, ur: NEGATIVE mg/dL
Specific Gravity, Urine: 1.013 (ref 1.005–1.030)
pH: 5 (ref 5.0–8.0)

## 2021-04-22 LAB — TROPONIN I (HIGH SENSITIVITY)
Troponin I (High Sensitivity): 7 ng/L (ref <18)
Troponin I (High Sensitivity): 7 ng/L (ref <18)

## 2021-04-22 LAB — RESP PANEL BY RT-PCR (FLU A&B, COVID) ARPGX2
Influenza A by PCR: NEGATIVE
Influenza B by PCR: NEGATIVE
SARS Coronavirus 2 by RT PCR: NEGATIVE

## 2021-04-22 LAB — LIPASE, BLOOD: Lipase: 26 U/L (ref 11–51)

## 2021-04-22 MED ORDER — KETOROLAC TROMETHAMINE 30 MG/ML IJ SOLN
15.0000 mg | Freq: Once | INTRAMUSCULAR | Status: AC
  Administered 2021-04-22: 15 mg via INTRAVENOUS
  Filled 2021-04-22: qty 1

## 2021-04-22 MED ORDER — CYCLOBENZAPRINE HCL 10 MG PO TABS: 10.0000 mg | ORAL_TABLET | Freq: Two times a day (BID) | ORAL | 0 refills | Status: DC | PRN

## 2021-04-22 MED ORDER — NAPROXEN 375 MG PO TABS: 375.0000 mg | ORAL_TABLET | Freq: Two times a day (BID) | ORAL | 0 refills | Status: DC

## 2021-04-22 MED ORDER — PREDNISONE 50 MG PO TABS: 50.0000 mg | ORAL_TABLET | Freq: Every day | ORAL | 0 refills | Status: DC

## 2021-04-22 NOTE — Discharge Instructions (Signed)
Take the medications as needed for pain and discomfort.  Follow-up with your doctor to be rechecked 

## 2021-04-22 NOTE — ED Provider Notes (Signed)
[facility] EMERGENCY DEPARTMENT Provider Note   CSN: [id number] Arrival date & time: 04/22/21  1403     History Chief Complaint  Patient presents with   Back Pain    Maria Oliver is a 60 y.o. female.   Back Pain  Patient presented to the ER for evaluation of pain in her back.  Patient states she is having pain in her lower back that radiates down her legs.  She denies any recent falls or injuries.  Patient states she is also had some diffuse body aches.  She is also noted some intermittent discomfort in her chest.  She had a slight cough but she does not think this is any different than usual.  She does have COPD.  She has felt some generalized weakness and fatigue.  She has not measured any fevers.  She is not having any vomiting or diarrhea.  Patient denies any numbness or weakness.  No urinary symptoms.  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 09/09/2016   Generalized headaches    Hernia, inguinal, right    Hypertension    Leg swelling     Patient Active Problem List   Diagnosis Date Noted   Syncope 02/22/2021   COVID-19 virus infection 02/22/2021   ARF (acute renal failure) (HCC) 02/22/2021   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/07/2017   Pruritic rash 10/14/2016   Chronic narcotic use 09/09/2016   Onychomycosis of right great toe 10/13/2015   Osteoarthritis 08/11/2015   Chronic abdominal pain 05/22/2015   Chronic pain syndrome 05/22/2015   Vitamin D insufficiency 02/06/2015   Chronic knee pain 02/06/2015   Ventral hernia without obstruction or gangrene 12/31/2014   Anxiety 12/31/2014   COPD (chronic obstructive  pulmonary disease) with emphysema (HCC)    Tobacco abuse 10/01/2009   Essential hypertension 10/01/2009    Past Surgical History:  Procedure Laterality Date   COLON SURGERY  2010   per patient "colon take down"   COLOSTOMY  2010   LAPAROSCOPIC CHOLECYSTECTOMY SINGLE SITE WITH INTRAOPERATIVE CHOLANGIOGRAM N/A 09/09/2016   Procedure: LAPAROSCOPIC CHOLECYSTECTOMY  WITH INTRAOPERATIVE CHOLANGIOGRAM;  Surgeon: Karie Soda, MD;  Location: [facility];  Service: General;  Laterality: N/A;   TUBAL LIGATION       OB History   No obstetric history on file.     Family History  Problem Relation Age of Onset   Heart disease Mother 68   Heart failure Mother    Asthma Mother    Emphysema Mother    Alzheimer's disease Father 70   Emphysema Brother    Asthma Brother    Colon cancer Neg Hx     Social History   Tobacco Use   Smoking status: Every Day    Packs/day: 1.00    Years: 47.00    Pack years: 47.00    Types: Cigarettes   Smokeless tobacco: Never   Tobacco comments:    1/2 ppd 02/25/20   Vaping Use   Vaping Use: Never used  Substance Use Topics   Alcohol use: No    Alcohol/week: 0.0 standard drinks   Drug use: No  Home Medications Prior to Admission medications   Medication Sig Start Date End Date Taking? Authorizing Provider  cyclobenzaprine (FLEXERIL) 10 MG tablet Take 1 tablet (10 mg total) by mouth 2 (two) times daily as needed for muscle spasms. 04/22/21  Yes Linwood Dibbles, MD  naproxen (NAPROSYN) 375 MG tablet Take 1 tablet (375 mg total) by mouth 2 (two) times daily. 04/22/21  Yes Linwood Dibbles, MD  predniSONE (DELTASONE) 50 MG tablet Take 1 tablet (50 mg total) by mouth daily. 04/22/21  Yes Linwood Dibbles, MD  acetaminophen (TYLENOL) 500 MG tablet Take 500 mg by mouth 4 (four) times daily as needed for headache. Patient not taking: Reported on 03/11/2021    [provider]  albuterol (VENTOLIN HFA) 108 (90 Base) MCG/ACT inhaler Inhale 2 puffs into the lungs every 6 (six)  hours as needed for wheezing or shortness of breath. 12/10/20   Anders Simmonds, PA-C  fluticasone-salmeterol (ADVAIR HFA) 115-21 MCG/ACT inhaler Inhale 2 puffs into the lungs 2 (two) times daily. Patient not taking: Reported on 03/11/2021 02/24/21   Leroy Sea, MD  guaiFENesin (MUCINEX) 600 MG 12 hr tablet Take 1 tablet (600 mg total) by mouth 2 (two) times daily. Patient not taking: No sig reported 02/21/21   Valinda Hoar, NP  lactose free nutrition (BOOST) LIQD Take 237 mLs by mouth every other day. Patient not taking: Reported on 03/11/2021    [provider]  lisinopril (ZESTRIL) 20 MG tablet Take 1 tablet (20 mg total) by mouth daily. Patient not taking: No sig reported 12/10/20   Anders Simmonds, PA-C  Multiple Vitamin (MULTIVITAMIN WITH MINERALS) TABS tablet Take 1 tablet by mouth daily.    [provider]  Vitamin D, Ergocalciferol, (DRISDOL) 1.25 MG (50000 UNIT) CAPS capsule Take 1 capsule (50,000 Units total) by mouth every 7 (seven) days. Patient not taking: Reported on 03/11/2021 12/11/20   Anders Simmonds, PA-C    Allergies    Aspirin, Ciprofloxacin, Codeine, Eggs or egg-derived products, Fentanyl, Hydrocodone, Lactose intolerance (gi), Oxycodone, Penicillins, and Tape  Review of Systems   Review of Systems  Musculoskeletal:  Positive for back pain.  All other systems reviewed and are negative.  Physical Exam Updated Vital Signs BP 133/83 (BP Location: Left Arm)   Pulse 69   Temp 98.1 F (36.7 C)   Resp (!) 22   SpO2 97%   Physical Exam Vitals and nursing note reviewed.  Constitutional:      Appearance: She is well-developed. She is not diaphoretic.  HENT:     Head: Normocephalic and atraumatic.     Right Ear: External ear normal.     Left Ear: External ear normal.  Eyes:     General: No scleral icterus.       Right eye: No discharge.        Left eye: No discharge.     Conjunctiva/sclera: Conjunctivae normal.  Neck:     Trachea:  No tracheal deviation.  Cardiovascular:     Rate and Rhythm: Normal rate and regular rhythm.  Pulmonary:     Effort: Pulmonary effort is normal. No respiratory distress.     Breath sounds: Normal breath sounds. No stridor. No wheezing or rales.  Abdominal:     General: Bowel sounds are normal. There is no distension.     Palpations: Abdomen is soft.     Tenderness: There is no abdominal tenderness. There is no guarding or rebound.  Musculoskeletal:  General: Tenderness present. No deformity.     Cervical back: Neck supple.     Lumbar back: Tenderness present. No swelling or deformity.     Comments: Negative straight leg raise  Skin:    General: Skin is warm and dry.     Findings: No rash.  Neurological:     General: No focal deficit present.     Mental Status: She is alert.     Cranial Nerves: No cranial nerve deficit (no facial droop, extraocular movements intact, no slurred speech).     Sensory: No sensory deficit.     Motor: No abnormal muscle tone or seizure activity.     Coordination: Coordination normal.     Comments: Normal strength and sensation bilateral lower extremities  Psychiatric:        Mood and Affect: Mood normal.    ED Results / Procedures / Treatments   Labs (all labs ordered are listed, but only abnormal results are displayed) Labs Reviewed  URINALYSIS, ROUTINE W REFLEX MICROSCOPIC - Abnormal; Notable for the following components:      Result Value   APPearance HAZY (*)    Leukocytes,Ua TRACE (*)    Bacteria, UA FEW (*)    All other components within normal limits  CBC WITH DIFFERENTIAL/PLATELET - Abnormal; Notable for the following components:   RBC 5.24 (*)    HCT 46.6 (*)    All other components within normal limits  COMPREHENSIVE METABOLIC PANEL - Abnormal; Notable for the following components:   BUN 7 (*)    Total Protein 5.9 (*)    All other components within normal limits  RESP PANEL BY RT-PCR (FLU A&B, COVID) ARPGX2  LIPASE, BLOOD   CBG MONITORING, ED  TROPONIN I (HIGH SENSITIVITY)  TROPONIN I (HIGH SENSITIVITY)    EKG EKG Interpretation  Date/Time:  Wednesday April 22 2021 15:19:30 EST Ventricular Rate:  82 PR Interval:  132 QRS Duration: 66 QT Interval:  380 QTC Calculation: 443 R Axis:   76 Text Interpretation: Normal sinus rhythm Cannot rule out Anterior infarct , age undetermined Abnormal ECG No significant change since last tracing Confirmed by Linwood Dibbles [phone]) on 04/22/2021 4:12:15 PM  Radiology DG Chest 2 View  Result Date: 04/22/2021 CLINICAL DATA:  Chest tightness. EXAM: CHEST - 2 VIEW COMPARISON:  Chest x-ray dated February 22, 2021. FINDINGS: The heart size and mediastinal contours are within normal limits. Normal pulmonary vascularity. Mild left lower lobe atelectasis. No focal consolidation, pleural effusion, or pneumothorax. No acute osseous abnormality. IMPRESSION: 1. Mild left lower lobe atelectasis. Electronically Signed   By: Obie Dredge M.D.   On: 04/22/2021 15:48   DG Lumbar Spine Complete  Result Date: 04/22/2021 CLINICAL DATA:  Low back pain.  Radiates to the right hip. EXAM: LUMBAR SPINE - COMPLETE 4+ VIEW COMPARISON:  None. FINDINGS: 5 nonrib bearing lumbar-type vertebral bodies. Vertebral body heights are maintained. No acute fracture. Generalized osteopenia. No static listhesis. No spondylolysis. Disc spaces are maintained. SI joints are unremarkable. IMPRESSION: No acute osseous injury of the lumbar spine. Electronically Signed   By: Elige Ko M.D.   On: 04/22/2021 16:42    Procedures Procedures   Medications Ordered in ED Medications  ketorolac (TORADOL) 30 MG/ML injection 15 mg (15 mg Intravenous Given 04/22/21 1655)    ED Course  I have reviewed the triage vital signs and the nursing notes.  Pertinent labs & imaging results that were available during my care of the patient were reviewed by me  and considered in my medical decision making (see chart for  details).  Clinical Course as of 04/22/21 2140  Wed Apr 22, 2021  1612 CXR without pna [JK]  1612 CBC with Differential(!) nl [JK]  1731 Urinalysis, Routine w reflex microscopic(!) Urinalysis not suggestive of infection [JK]  1823 COVID and flu are negative.  Initial troponin normal [JK]  1930 Lumbar spine x-rays without acute findings [JK]    Clinical Course User Index [JK] Linwood Dibbles, MD   MDM Rules/Calculators/A&P                           Patient presented to ED with several complaints.  Patient was complaining of chest discomfort.  Symptoms were atypical for ACS.  Patient serial troponins are normal.  Chest x-ray does not show evidence of pneumonia.    Patient also had some URI symptoms.  COVID and flu are negative.  Patient is having back pain but no abdominal pain.  She does have tenderness in the musculoskeletal region.  There is some component of radiation into her extremities suggesting possible sciatica.  No acute neurovascular deficits noted on exam.  Will discharge home with prescription for NSAIDs for short course of steroids and muscle relaxants.  Outpatient follow-up with PCP Final Clinical Impression(s) / ED Diagnoses Final diagnoses:  Low back pain, unspecified back pain laterality, unspecified chronicity, unspecified whether sciatica present    Rx / DC Orders ED Discharge Orders          Ordered    naproxen (NAPROSYN) 375 MG tablet  2 times daily        04/22/21 2139    cyclobenzaprine (FLEXERIL) 10 MG tablet  2 times daily PRN        04/22/21 2139    predniSONE (DELTASONE) 50 MG tablet  Daily        04/22/21 2139             Linwood Dibbles, MD 04/22/21 2140

## 2021-04-22 NOTE — ED Notes (Signed)
Patient verbalizes understanding of discharge instructions. Opportunity for questioning and answers were provided. Armband removed by staff, pt discharged from ED ambulatory.   

## 2021-04-22 NOTE — ED Provider Notes (Signed)
Emergency Medicine Provider Triage Evaluation Note  Maria Oliver , a 60 y.o. female  was evaluated in triage.  Pt complains of lower back pain, suprapubic pain, chest pain.  Patient states over the past couple of days she has had some generalized weakness that developed.  She also had some lower back pain that started her prominent on the right side and the left.  She says it started suddenly with no apparent injury.  States that she has had some lower pelvic pain in the center.  It sounds like she is having some myalgias.  Denies fevers or chills.  She has a history of tobacco use, hypertension, COPD.  Review of Systems  Positive: Generalized weakness, back pain, pelvic pain, myalgias, chest pain Negative: Shortness of breath, nausea, vomiting, diarrhea, constipation.  Physical Exam  BP (!) 123/100 (BP Location: Right Arm)   Pulse 86   Temp 98.2 F (36.8 C) (Oral)   Resp 18   SpO2 97%  Gen:   Awake, no distress   Resp:  Normal effort  MSK:   Moves extremities without difficulty  Other:  Right CVA tenderness.  Medical Decision Making  Medically screening exam initiated at 3:24 PM.  Appropriate orders placed.  [name] was informed that the remainder of the evaluation will be completed by another provider, this initial triage assessment does not replace that evaluation, and the importance of remaining in the ED until their evaluation is complete.  ACS workup for generalized weakness and chest pain. UTI, pyelo consideration.   Maria Oliver 04/22/21 1528    Maria Score, MD 04/22/21 [phone]

## 2021-04-22 NOTE — ED Triage Notes (Addendum)
Patient here with complaint of lower back pain with radiation to right leg for the last several days. Denies recent known injury. Patient also complains of generalized weakness that started a few weeks ago. Patient alert, oriented, ambulatory, and in no apparent distress at this time.

## 2021-05-06 ENCOUNTER — Other Ambulatory Visit: Payer: Self-pay

## 2021-05-06 ENCOUNTER — Encounter (HOSPITAL_COMMUNITY): Payer: Self-pay | Admitting: Emergency Medicine

## 2021-05-06 ENCOUNTER — Emergency Department (HOSPITAL_COMMUNITY): Payer: Medicare Other

## 2021-05-06 ENCOUNTER — Emergency Department (HOSPITAL_COMMUNITY)
Admission: EM | Admit: 2021-05-06 | Discharge: 2021-05-06 | Disposition: A | Discharge status: Home / Self Care | Payer: Medicare Other | Attending: Emergency Medicine | Admitting: Emergency Medicine

## 2021-05-06 DIAGNOSIS — J449 Chronic obstructive pulmonary disease, unspecified: Secondary | ICD-10-CM | POA: Insufficient documentation

## 2021-05-06 DIAGNOSIS — Z20822 Contact with and (suspected) exposure to covid-19: Secondary | ICD-10-CM | POA: Diagnosis not present

## 2021-05-06 DIAGNOSIS — R519 Headache, unspecified: Secondary | ICD-10-CM

## 2021-05-06 DIAGNOSIS — Z7951 Long term (current) use of inhaled steroids: Secondary | ICD-10-CM | POA: Insufficient documentation

## 2021-05-06 DIAGNOSIS — Z8616 Personal history of COVID-19: Secondary | ICD-10-CM | POA: Diagnosis not present

## 2021-05-06 DIAGNOSIS — J069 Acute upper respiratory infection, unspecified: Secondary | ICD-10-CM | POA: Insufficient documentation

## 2021-05-06 DIAGNOSIS — J45909 Unspecified asthma, uncomplicated: Secondary | ICD-10-CM | POA: Diagnosis not present

## 2021-05-06 DIAGNOSIS — Z79899 Other long term (current) drug therapy: Secondary | ICD-10-CM | POA: Diagnosis not present

## 2021-05-06 DIAGNOSIS — F17210 Nicotine dependence, cigarettes, uncomplicated: Secondary | ICD-10-CM | POA: Diagnosis not present

## 2021-05-06 DIAGNOSIS — I10 Essential (primary) hypertension: Secondary | ICD-10-CM | POA: Insufficient documentation

## 2021-05-06 LAB — COMPREHENSIVE METABOLIC PANEL
ALT: 33 U/L (ref 0–44)
AST: 35 U/L (ref 15–41)
Albumin: 4.2 g/dL (ref 3.5–5.0)
Alkaline Phosphatase: 67 U/L (ref 38–126)
Anion gap: 12 (ref 5–15)
BUN: 12 mg/dL (ref 8–23)
CO2: 25 mmol/L (ref 22–32)
Calcium: 9.6 mg/dL (ref 8.9–10.3)
Chloride: 98 mmol/L (ref 98–111)
Creatinine, Ser: 0.74 mg/dL (ref 0.44–1.00)
GFR, Estimated: >60 mL/min (ref >60)
Glucose, Bld: 108 mg/dL — ABNORMAL HIGH (ref 70–99)
Potassium: 4 mmol/L (ref 3.5–5.1)
Sodium: 135 mmol/L (ref 135–145)
Total Bilirubin: 0.6 mg/dL (ref 0.3–1.2)
Total Protein: 7.2 g/dL (ref 6.5–8.1)

## 2021-05-06 LAB — CBC
HCT: 50.8 % — ABNORMAL HIGH (ref 36.0–46.0)
Hemoglobin: 16.4 g/dL — ABNORMAL HIGH (ref 12.0–15.0)
MCH: 28.2 pg (ref 26.0–34.0)
MCHC: 32.3 g/dL (ref 30.0–36.0)
MCV: 87.4 fL (ref 80.0–100.0)
Platelets: 265 10*3/uL (ref 150–400)
RBC: 5.81 MIL/uL — ABNORMAL HIGH (ref 3.87–5.11)
RDW: 14.1 % (ref 11.5–15.5)
WBC: 6.1 10*3/uL (ref 4.0–10.5)
nRBC: 0 % (ref 0.0–0.2)

## 2021-05-06 LAB — URINALYSIS, ROUTINE W REFLEX MICROSCOPIC
Bilirubin Urine: NEGATIVE
Glucose, UA: NEGATIVE mg/dL
Ketones, ur: 80 mg/dL — AB
Leukocytes,Ua: NEGATIVE
Nitrite: NEGATIVE
Protein, ur: 30 mg/dL — AB
Specific Gravity, Urine: 1.033 — ABNORMAL HIGH (ref 1.005–1.030)
pH: 5 (ref 5.0–8.0)

## 2021-05-06 LAB — RESP PANEL BY RT-PCR (FLU A&B, COVID) ARPGX2
Influenza A by PCR: NEGATIVE
Influenza B by PCR: NEGATIVE
SARS Coronavirus 2 by RT PCR: NEGATIVE

## 2021-05-06 LAB — LIPASE, BLOOD: Lipase: 25 U/L (ref 11–51)

## 2021-05-06 MED ORDER — ONDANSETRON HCL 4 MG PO TABS: 4.0000 mg | ORAL_TABLET | Freq: Four times a day (QID) | ORAL | 0 refills | Status: DC

## 2021-05-06 MED ORDER — SODIUM CHLORIDE 0.9 % IV BOLUS
1000.0000 mL | Freq: Once | INTRAVENOUS | Status: AC
  Administered 2021-05-06: 1000 mL via INTRAVENOUS

## 2021-05-06 MED ORDER — ONDANSETRON HCL 4 MG/2ML IJ SOLN
4.0000 mg | Freq: Once | INTRAMUSCULAR | Status: AC
  Administered 2021-05-06: 4 mg via INTRAVENOUS
  Filled 2021-05-06: qty 2

## 2021-05-06 MED ORDER — ACETAMINOPHEN 325 MG PO TABS
650.0000 mg | ORAL_TABLET | Freq: Once | ORAL | Status: AC
  Administered 2021-05-06: 650 mg via ORAL
  Filled 2021-05-06: qty 2

## 2021-05-06 NOTE — ED Provider Notes (Signed)
Emergency Medicine Provider Triage Evaluation Note  Maria Oliver , a 60 y.o. female  was evaluated in triage.  Pt complains of nvd, cough, abd pain, headache.  Review of Systems  Positive: nvd, cough, abd pain, headache. Negative: Chest pain  Physical Exam  BP (!) 126/95 (BP Location: Right Arm)   Pulse 84   Temp 98.3 F (36.8 C)   Resp 20   Ht 5\' 4"  (1.626 m)   Wt 68 kg   SpO2 97%   BMI 25.75 kg/m  Gen:   Awake, no distress   Resp:  Normal effort  MSK:   Moves extremities without difficulty   Medical Decision Making  Medically screening exam initiated at 2:32 PM.  Appropriate orders placed.  Maria Oliver was informed that the remainder of the evaluation will be completed by another provider, this initial triage assessment does not replace that evaluation, and the importance of remaining in the ED until their evaluation is complete.     Madie Reno, PA-C 05/06/21 1432    05/08/21, DO 05/06/21 1625

## 2021-05-06 NOTE — ED Triage Notes (Signed)
Patient c/o headache, nausea, cough, weakness and dark urine x 2 days.

## 2021-05-06 NOTE — ED Provider Notes (Signed)
[facility] EMERGENCY DEPARTMENT Provider Note   CSN: [id number] Arrival date & time: 05/06/21  1343     History Chief Complaint  Patient presents with   Headache    Maria Oliver is a 60 y.o. female.  The history is provided by the patient.  URI Presenting symptoms: congestion and cough   Presenting symptoms: no ear pain, no fever and no sore throat   Severity:  Mild Timing:  Constant Chronicity:  New Relieved by:  Nothing Worsened by:  Nothing Ineffective treatments:  None tried Associated symptoms: headaches   Associated symptoms: no arthralgias   Associated symptoms comment:  N/v/d      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 09/09/2016   Generalized headaches    Hernia, inguinal, right    Hypertension    Leg swelling     Patient Active Problem List   Diagnosis Date Noted   Syncope 02/22/2021   COVID-19 virus infection 02/22/2021   ARF (acute renal failure) (HCC) 02/22/2021   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/07/2017   Pruritic rash 10/14/2016   Chronic narcotic use 09/09/2016   Onychomycosis of right great toe 10/13/2015   Osteoarthritis 08/11/2015   Chronic abdominal pain 05/22/2015   Chronic pain syndrome 05/22/2015   Vitamin D insufficiency 02/06/2015   Chronic knee pain 02/06/2015   Ventral hernia without obstruction or gangrene 12/31/2014   Anxiety 12/31/2014   COPD (chronic obstructive pulmonary disease) with emphysema (HCC)    Tobacco abuse 10/01/2009   Essential hypertension 10/01/2009    Past Surgical History:  Procedure Laterality Date   COLON SURGERY  2010   per patient "colon  take down"   COLOSTOMY  2010   LAPAROSCOPIC CHOLECYSTECTOMY SINGLE SITE WITH INTRAOPERATIVE CHOLANGIOGRAM N/A 09/09/2016   Procedure: LAPAROSCOPIC CHOLECYSTECTOMY  WITH INTRAOPERATIVE CHOLANGIOGRAM;  Surgeon: Karie Soda, MD;  Location: [facility];  Service: General;  Laterality: N/A;   TUBAL LIGATION       OB History   No obstetric history on file.     Family History  Problem Relation Age of Onset   Heart disease Mother 15   Heart failure Mother    Asthma Mother    Emphysema Mother    Alzheimer's disease Father 27   Emphysema Brother    Asthma Brother    Colon cancer Neg Hx     Social History   Tobacco Use   Smoking status: Every Day    Packs/day: 1.00    Years: 47.00    Pack years: 47.00    Types: Cigarettes   Smokeless tobacco: Never   Tobacco comments:    1/2 ppd 02/25/20   Vaping Use   Vaping Use: Never used  Substance Use Topics   Alcohol use: No    Alcohol/week: 0.0 standard drinks   Drug use: No    Home Medications Prior to Admission medications   Medication Sig Start Date End Date Taking? Authorizing Provider  acetaminophen (TYLENOL) 500 MG tablet Take 500-1,000 mg by mouth daily as needed for headache, mild pain or fever.   Yes [provider]  albuterol (VENTOLIN HFA) 108 (90 Base)  MCG/ACT inhaler Inhale 2 puffs into the lungs every 6 (six) hours as needed for wheezing or shortness of breath. 12/10/20  Yes McClung, Angela M, PA-C  cyclobenzaprine (FLEXERIL) 10 MG tablet Take 1 tablet (10 mg total) by mouth 2 (two) times daily as needed for muscle spasms. 04/22/21  Yes Dorie Rank, MD  fluticasone-salmeterol (ADVAIR HFA) [phone] MCG/ACT inhaler Inhale 2 puffs into the lungs 2 (two) times daily. 02/24/21  Yes Thurnell Lose, MD  Multiple Vitamin (MULTIVITAMIN WITH MINERALS) TABS tablet Take 1 tablet by mouth daily.   Yes [provider]  ondansetron (ZOFRAN) 4 MG tablet Take 1 tablet (4 mg total) by mouth every 6 (six) hours. 05/06/21  Yes  Rahma Meller, DO  guaiFENesin (MUCINEX) 600 MG 12 hr tablet Take 1 tablet (600 mg total) by mouth 2 (two) times daily. Patient not taking: Reported on 05/06/2021 02/21/21   Hans Eden, NP  lactose free nutrition (BOOST) LIQD Take 237 mLs by mouth every other day. Patient not taking: Reported on 05/06/2021    [provider]  lisinopril (ZESTRIL) 20 MG tablet Take 1 tablet (20 mg total) by mouth daily. Patient not taking: Reported on 05/06/2021 12/10/20   Argentina Donovan, PA-C  naproxen (NAPROSYN) 375 MG tablet Take 1 tablet (375 mg total) by mouth 2 (two) times daily. Patient not taking: Reported on 05/06/2021 04/22/21   Dorie Rank, MD  predniSONE (DELTASONE) 50 MG tablet Take 1 tablet (50 mg total) by mouth daily. Patient not taking: Reported on 05/06/2021 04/22/21   Dorie Rank, MD  Vitamin D, Ergocalciferol, (DRISDOL) 1.25 MG (50000 UNIT) CAPS capsule Take 1 capsule (50,000 Units total) by mouth every 7 (seven) days. Patient not taking: Reported on 05/06/2021 12/11/20   Argentina Donovan, PA-C    Allergies    Aspirin, Ciprofloxacin, Codeine, Eggs or egg-derived products, Fentanyl, Hydrocodone, Lactose intolerance (gi), Oxycodone, Penicillin g, Penicillins, and Tape  Review of Systems   Review of Systems  Constitutional:  Negative for chills and fever.  HENT:  Positive for congestion. Negative for ear pain and sore throat.   Eyes:  Negative for pain and visual disturbance.  Respiratory:  Positive for cough. Negative for shortness of breath.   Cardiovascular:  Negative for chest pain and palpitations.  Gastrointestinal:  Negative for abdominal pain and vomiting.  Genitourinary:  Negative for dysuria and hematuria.  Musculoskeletal:  Negative for arthralgias and back pain.  Skin:  Negative for color change and rash.  Neurological:  Positive for headaches. Negative for seizures and syncope.  All other systems reviewed and are negative.  Physical Exam Updated Vital  Signs BP (!) 141/98 (BP Location: Left Arm)   Pulse 68   Temp 98.6 F (37 C) (Oral)   Resp 15   Ht 5\' 4"  (1.626 m)   Wt 68 kg   SpO2 98%   BMI 25.75 kg/m   Physical Exam Vitals and nursing note reviewed.  Constitutional:      General: She is not in acute distress.    Appearance: She is well-developed. She is not ill-appearing.  HENT:     Head: Normocephalic and atraumatic.  Eyes:     Extraocular Movements: Extraocular movements intact.     Right eye: Normal extraocular motion and no nystagmus.     Left eye: Normal extraocular motion and no nystagmus.     Conjunctiva/sclera: Conjunctivae normal.     Pupils: Pupils are equal, round, and reactive to light.  Cardiovascular:  Rate and Rhythm: Normal rate and regular rhythm.     Heart sounds: Normal heart sounds. No murmur heard. Pulmonary:     Effort: Pulmonary effort is normal. No respiratory distress.     Breath sounds: Normal breath sounds.  Abdominal:     Palpations: Abdomen is soft.     Tenderness: There is no abdominal tenderness.  Musculoskeletal:        General: No swelling.     Cervical back: Normal range of motion and neck supple.  Skin:    General: Skin is warm and dry.     Capillary Refill: Capillary refill takes less than 2 seconds.  Neurological:     Mental Status: She is alert and oriented to person, place, and time.  Psychiatric:        Mood and Affect: Mood normal.    ED Results / Procedures / Treatments   Labs (all labs ordered are listed, but only abnormal results are displayed) Labs Reviewed  CBC - Abnormal; Notable for the following components:      Result Value   RBC 5.81 (*)    Hemoglobin 16.4 (*)    HCT 50.8 (*)    All other components within normal limits  URINALYSIS, ROUTINE W REFLEX MICROSCOPIC - Abnormal; Notable for the following components:   Color, Urine AMBER (*)    APPearance HAZY (*)    Specific Gravity, Urine 1.033 (*)    Hgb urine dipstick SMALL (*)    Ketones, ur 80 (*)     Protein, ur 30 (*)    Bacteria, UA RARE (*)    All other components within normal limits  COMPREHENSIVE METABOLIC PANEL - Abnormal; Notable for the following components:   Glucose, Bld 108 (*)    All other components within normal limits  RESP PANEL BY RT-PCR (FLU A&B, COVID) ARPGX2  LIPASE, BLOOD    EKG None  Radiology DG Chest 2 View  Result Date: 05/06/2021 CLINICAL DATA:  Cough, body aches and fever with dry cough. EXAM: CHEST - 2 VIEW COMPARISON:  April 22, 2021. FINDINGS: Cardiomediastinal contours and hilar structures are normal. Lungs are hyperinflated without effusion or consolidation. On limited assessment there is no acute skeletal process. Scarring is present at the LEFT lung base. IMPRESSION: Hyperinflation without acute cardiopulmonary disease. Electronically Signed   By: Zetta Bills M.D.   On: 05/06/2021 15:00   CT Head Wo Contrast  Result Date: 05/06/2021 CLINICAL DATA:  Headache EXAM: CT HEAD WITHOUT CONTRAST TECHNIQUE: Contiguous axial images were obtained from the base of the skull through the vertex without intravenous contrast. COMPARISON:  MRI 02/28/2020 FINDINGS: Brain: No acute territorial infarction or intracranial hemorrhage is visualized. 12 mm left posterior convexity meningioma redemonstrated. Mild chronic small vessel ischemic changes of the white matter. Stable ventricle size Vascular: No hyperdense vessels.  Carotid vascular calcification Skull: Normal. Negative for fracture or focal lesion. Sinuses/Orbits: Mucosal thickening in the sinuses Other: None IMPRESSION: 1. No CT evidence for acute intracranial abnormality. 2. Atrophy and mild chronic small vessel ischemic changes of the white matter Electronically Signed   By: Donavan Foil M.D.   On: 05/06/2021 17:45    Procedures Procedures   Medications Ordered in ED Medications  sodium chloride 0.9 % bolus 1,000 mL (0 mLs Intravenous Stopped 05/06/21 1748)  ondansetron (ZOFRAN) injection 4 mg (4 mg  Intravenous Given 05/06/21 1626)  acetaminophen (TYLENOL) tablet 650 mg (650 mg Oral Given 05/06/21 1626)    ED Course  I have reviewed  the triage vital signs and the nursing notes.  Pertinent labs & imaging results that were available during my care of the patient were reviewed by me and considered in my medical decision making (see chart for details).    MDM Rules/Calculators/A&P                           LEAHANNA WOODHULL is here with URI symptoms, nausea, vomiting, sinus congestion, headaches, body aches.  Normal vitals.  No fever.  Lab work including chest x-ray and viral testing done prior to my evaluation and is overall unremarkable.  No significant anemia, electrolyte abnormality, kidney injury.  Multiple sick contacts.  Headache worse than normal.  Will get head CT.  She states that she might have a history of mass in her brain.  She overall appears well.  No concern for meningitis.  Does not have fever.  She is had a lot of GI symptoms and possibly foodborne illness.  Not having any abdominal discomfort on exam.  No concern for appendicitis or other intra-abdominal process.  We will give her IV fluids, IV Zofran and reevaluate.  CT scan is unremarkable.  Patient feeling better after IV fluids and IV Zofran.  Overall suspect viral process.  Discharged in good condition.  This chart was dictated using voice recognition software.  Despite best efforts to proofread,  errors can occur which can change the documentation meaning.   Final Clinical Impression(s) / ED Diagnoses Final diagnoses:  Upper respiratory tract infection, unspecified type  Acute nonintractable headache, unspecified headache type    Rx / DC Orders ED Discharge Orders          Ordered    ondansetron (ZOFRAN) 4 MG tablet  Every 6 hours        05/06/21 1815             Lennice Sites, DO 05/06/21 1817

## 2021-05-11 ENCOUNTER — Telehealth (INDEPENDENT_AMBULATORY_CARE_PROVIDER_SITE_OTHER): Payer: Medicare Other | Admitting: Nurse Practitioner

## 2021-05-11 ENCOUNTER — Encounter: Payer: Self-pay | Admitting: Nurse Practitioner

## 2021-05-11 ENCOUNTER — Telehealth: Payer: Self-pay | Admitting: Primary Care

## 2021-05-11 DIAGNOSIS — J069 Acute upper respiratory infection, unspecified: Secondary | ICD-10-CM | POA: Diagnosis not present

## 2021-05-11 DIAGNOSIS — J441 Chronic obstructive pulmonary disease with (acute) exacerbation: Secondary | ICD-10-CM

## 2021-05-11 DIAGNOSIS — R42 Dizziness and giddiness: Secondary | ICD-10-CM | POA: Insufficient documentation

## 2021-05-11 DIAGNOSIS — J399 Disease of upper respiratory tract, unspecified: Secondary | ICD-10-CM

## 2021-05-11 MED ORDER — MECLIZINE HCL 12.5 MG PO TABS: 25.0000 mg | ORAL_TABLET | Freq: Three times a day (TID) | ORAL | 0 refills | Status: DC | PRN

## 2021-05-11 MED ORDER — FLUTICASONE PROPIONATE 50 MCG/ACT NA SUSP: 1.0000 | Freq: Every day | NASAL | 2 refills | Status: DC

## 2021-05-11 MED ORDER — DOXYCYCLINE HYCLATE 100 MG PO TABS: 100.0000 mg | ORAL_TABLET | Freq: Two times a day (BID) | ORAL | 0 refills | Status: DC

## 2021-05-11 NOTE — Assessment & Plan Note (Signed)
-  CT head negative. Likely related to congestion/URI. Associated nausea. No other cardiac symptoms present.  -Meclizine 25 mg every 8 hours as needed for dizziness/nausea -If worsening occurs or syncope occurs, return to ED for further work up.  -Monitor BP at home. -Avoid driving or operating heavy machinery or other activities until dizziness subsides.

## 2021-05-11 NOTE — Assessment & Plan Note (Signed)
-  Cough, congestion, headaches x 6 days with worsening symptoms per pt. No fever/chills. Neg RVP at ED.  -Supportive care -Flonase 1 spray each nostril daily -Mucinex over the counter as directed every 12 hours -Saline nasal spray over the counter 2-3 times a day  -Tylenol over the counter as directed for pain/headache. Do not exceed 4000 mg in a 24 hour period.  -No decongestants due to HTN

## 2021-05-11 NOTE — Progress Notes (Signed)
Patient ID: Maria Oliver, female     DOB: March 13, 1961, 60 y.o.      MRN: [id number]  Chief Complaint  Patient presents with   Follow-up    Virtual Visit via Video Note  I connected with Maria Oliver on 05/11/21 at 11:00 AM EST by a video enabled telemedicine application and verified that I am speaking with the correct person using two identifiers.  Location: Patient: Home Provider: Office   I discussed the limitations of evaluation and management by telemedicine and the availability of in person appointments. The patient expressed understanding and agreed to proceed.  History of Present Illness: 60 year old female, current smoker (47 pack years) followed for COPD with emphysema and asthma.  Patient of Dr. Laurena Spies and last seen via virtual visit by Buelah Manis, NP on 09/05/2020.  Past medical history significant for hypertension, chronic pain syndrome, and anxiety. PFTs in 2019 showed mild obstruction with significant BD response. Maintained on Advair 115-25mcg HFA.    TESTS/EVENTS: 07/29/2020 LDCT: Lung RADS 2, benign appearance or behavior. Centrilobular and paraseptal emphysema. Unchanged chronic scarring in lingula. New geographic ares of groundglass attenuation both lower lungs favored to represent sequelae of infection or inflammation.  02/22/2021 CTA chest/abd/pel for dissection: no evidence of thoracic aortic aneurysm or dissection. Arthrosclerotic calcifications of the aortic arch. No PE noted. Heart size normal. No lymphadenopathy. 8 mm right thyroid nodule not clinically sig with no f/u recommended. Mild centrilobular and paraseptal emphysematous changes, upper lung predominant. Scarring/atelectasis in the lingula and bilat lower lobes. 15 mm ground-glass nodule in the posterior right upper lobe unchanged from previous exam. Additional scatter ground-glass nodules are unchanged and better evaluated on LDCT. No evidence of abd aortic aneurysm or dissection. Large lower midline  ventral hernia without associated inflammatory changes. No evidence of bowel obstruction.  05/06/2021 CXR 2 View: Hyperinflation without acute cardiopulmonary disease. Scarring present left lung base.   09/05/2020: Virtual visit with Ames Dura, NP.  Productive cough with thick yellow mucus x2 days.  Shoulder pain, chest tightness and wheezing.  Increased rescue inhaler use.  COPD exacerbation.  Rx doxycycline x7 days and prednisone taper.  Mucinex twice daily.  Continue Advair twice daily.  05/06/2021: ED visit. Cough, congestion, headaches, dizziness and nausea x 2 days. CT head neg. RVP neg. CXR hyperinflation without acute illness/disease. Diagnosed with URI and discharged with rx for zofran.  05/11/2021: Today - acute sick visit Patient presents today via virtual visit for reported cough, congestion, and headaches about six days ago. Her symptoms have persisted and have not improved despite being treated in the ED on 11/23. She reports yellow sputum production and yellow rhinorrhea. She has also been experiencing intermittent episodes of dizziness and nausea. No vomiting or syncope. Recent CT head negative. She denies shortness of breath, wheezing, visual disturbances, orthopnea, PND, CP, or leg swelling. No fever, no chills, or body aches. She has been taking tylenol as needed for headaches with moderate relief. She was prescribed zofran in the ED for nausea but has not been using it. Continues on Advair twice daily. She is utilizing her rescue inhaler maybe once a day. Overall, she feels poorly but reports that her breathing is stable.   Allergies  Allergen Reactions   Aspirin Nausea Only and Other (See Comments)    GI upset/Hernia     Ciprofloxacin Nausea Only and Other (See Comments)    Tendonitis, joint pain, and nausea.  "Interfered with her HCTZ" (also)   Codeine Nausea And Vomiting  and Other (See Comments)    (Also) GI upset/Hernia (CAN TOLERATE DILAUDID & MORPHINE)   Eggs Or  Egg-Derived Products Nausea Only    Stomach pains   Fentanyl Nausea Only and Other (See Comments)    Tolerates Dilaudid or morphine much better   Hydrocodone Nausea And Vomiting   Lactose Intolerance (Gi) Nausea And Vomiting and Other (See Comments)    (Also) vertigo   Oxycodone Nausea And Vomiting    Tolerates hydromorphone better   Penicillin G Rash   Penicillins Rash    Has patient had a PCN reaction causing immediate rash, facial/tongue/throat swelling, SOB or lightheadedness with hypotension: Yes Has patient had a PCN reaction causing severe rash involving mucus membranes or skin necrosis: No Has patient had a PCN reaction that required hospitalization No Has patient had a PCN reaction occurring within the last 10 years: No If all of the above answers are "NO", then may proceed with Cephalosporin use.    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 09/09/2016   Generalized headaches    Hernia, inguinal, right    Hypertension    Leg swelling     Tobacco History: Social History   Tobacco Use  Smoking Status Every Day   Packs/day: 1.00   Years: 47.00   Pack years: 47.00   Types: Cigarettes  Smokeless Tobacco Never  Tobacco Comments   1/2 ppd 02/25/20    Ready to quit: Not Answered Counseling given: Not Answered Tobacco comments: 1/2 ppd 02/25/20    Outpatient Medications Prior to Visit  Medication Sig Dispense Refill   acetaminophen (TYLENOL) 500 MG tablet Take 500-1,000 mg by mouth daily as needed for headache, mild  pain or fever.     albuterol (VENTOLIN HFA) 108 (90 Base) MCG/ACT inhaler Inhale 2 puffs into the lungs every 6 (six) hours as needed for wheezing or shortness of breath. 8.5 g 0   fluticasone-salmeterol (ADVAIR HFA) 115-21 MCG/ACT inhaler Inhale 2 puffs into the lungs 2 (two) times daily. 1 each 12   Multiple Vitamin (MULTIVITAMIN WITH MINERALS) TABS tablet Take 1 tablet by mouth daily.     cyclobenzaprine (FLEXERIL) 10 MG tablet Take 1 tablet (10 mg total) by mouth 2 (two) times daily as needed for muscle spasms. (Patient not taking: Reported on 05/11/2021) 20 tablet 0   guaiFENesin (MUCINEX) 600 MG 12 hr tablet Take 1 tablet (600 mg total) by mouth 2 (two) times daily. (Patient not taking: Reported on 05/06/2021) 30 tablet 0   lactose free nutrition (BOOST) LIQD Take 237 mLs by mouth every other day. (Patient not taking: Reported on 05/11/2021)     lisinopril (ZESTRIL) 20 MG tablet Take 1 tablet (20 mg total) by mouth daily. (Patient not taking: Reported on 05/06/2021) 90 tablet 3   naproxen (NAPROSYN) 375 MG tablet Take 1 tablet (375 mg total) by mouth 2 (two) times daily. (Patient not taking: Reported on 05/06/2021) 20 tablet 0   ondansetron (ZOFRAN) 4 MG tablet Take 1 tablet (4  mg total) by mouth every 6 (six) hours. (Patient not taking: Reported on 05/11/2021) 12 tablet 0   Vitamin D, Ergocalciferol, (DRISDOL) 1.25 MG (50000 UNIT) CAPS capsule Take 1 capsule (50,000 Units total) by mouth every 7 (seven) days. (Patient not taking: Reported on 05/06/2021) 16 capsule 0   predniSONE (DELTASONE) 50 MG tablet Take 1 tablet (50 mg total) by mouth daily. (Patient not taking: Reported on 05/06/2021) 5 tablet 0   No facility-administered medications prior to visit.     Review of Systems:   Constitutional: No weight loss or gain, night sweats, fevers, chills, fatigue, or lassitude. HEENT: +headaches, nasal congestion, rhinorrhea with yellow exudate. No difficulty swallowing, tooth/dental problems, or  sore throat. No sneezing, itching, ear ache CV:  No chest pain, orthopnea, PND, swelling in lower extremities, anasarca, palpitations, syncope Resp: +Productive cough with increased yellow sputum. No shortness of breath with exertion or at rest.  No hemoptysis. No wheezing.  No chest wall deformity GI:  +Nausea. No heartburn, indigestion, abdominal pain, vomiting, diarrhea, change in bowel habits, loss of appetite, bloody stools.  GU: No dysuria, change in color of urine, urgency or frequency.  No flank pain, no hematuria  Skin: No rash, lesions, ulcerations MSK:  No joint pain or swelling.  No decreased range of motion.  No back pain. Neuro: +dizziness. No syncope or pre-syncopal events.  Psych: No depression or anxiety. Mood stable.   Observations/Objective: Patient able to speak in full sentences. A&Ox3. No appearance or report of acute distress. Occasional cough.   Assessment and Plan: COPD with acute exacerbation ([place]) -Increased cough with increased sputum production x 6 days.  -Continued Advair 2 puffs Twice daily  -Continue albuterol 2 puffs every 6 hours as needed for shortness of breath or wheezing -Patient preferred to not take prednisone as it makes her "incredibly jittery" -Doxycycline 100 mg Twice daily for 7 days. Notify immediately of any rash, itching, hives, or swelling, or seek emergency care. Finish your antibiotics in their entirety. Do not stop just because symptoms improve.  Avoid sun exposure and wear SPF when outside as this medicine can cause photosensitivity and hyperpigmentation. -Chlortab 4 mg over the counter At bedtime as needed for cough  If symptoms do not improve or worsen, patient will need to be seen in the office for further evaluation and chest x ray.  Upper respiratory infection, acute -Cough, congestion, headaches x 6 days with worsening symptoms per pt. No fever/chills. Neg RVP at ED.  -Supportive care -Flonase 1 spray each nostril daily -Mucinex  over the counter as directed every 12 hours -Saline nasal spray over the counter 2-3 times a day  -Tylenol over the counter as directed for pain/headache. Do not exceed 4000 mg in a 24 hour period.  -No decongestants due to HTN   Dizziness -CT head negative. Likely related to congestion/URI. Associated nausea. No other cardiac symptoms present.  -Meclizine 25 mg every 8 hours as needed for dizziness/nausea -If worsening occurs or syncope occurs, return to ED for further work up.  -Monitor BP at home. -Avoid driving or operating heavy machinery or other activities until dizziness subsides.     Follow Up Instructions: Follow up in 3 months with Dr. Silas Flood or APP. If symptoms do not improve or worsen, please contact office for sooner follow up or seek emergency care.    I discussed the assessment and treatment plan with the patient. The patient was provided an opportunity to ask questions and all were answered. The patient  agreed with the plan and demonstrated an understanding of the instructions.   The patient was advised to call back or seek an in-person evaluation if the symptoms worsen or if the condition fails to improve as anticipated.  I provided 20 minutes of non-face-to-face time during this encounter.   Clayton Bibles, NP

## 2021-05-11 NOTE — Telephone Encounter (Signed)
I called and spoke with the pt  She is c/o increased cough with yellow sputum, wheezing, SOB and sinus pain x 10 days  She went to ED 05/06/21 and states tested neg for flu and covid  She is having body aches but no fever  She is asking for abx  Video visit with Katie at 11 today

## 2021-05-11 NOTE — Assessment & Plan Note (Addendum)
-  Increased cough with increased sputum production x 6 days.  -Continued Advair 2 puffs Twice daily  -Continue albuterol 2 puffs every 6 hours as needed for shortness of breath or wheezing -Patient preferred to not take prednisone as it makes her "incredibly jittery" -Doxycycline 100 mg Twice daily for 7 days. Notify immediately of any rash, itching, hives, or swelling, or seek emergency care. Finish your antibiotics in their entirety. Do not stop just because symptoms improve.  Avoid sun exposure and wear SPF when outside as this medicine can cause photosensitivity and hyperpigmentation. -Chlortab 4 mg over the counter At bedtime as needed for cough  If symptoms do not improve or worsen, patient will need to be seen in the office for further evaluation and chest x ray.

## 2021-05-26 ENCOUNTER — Other Ambulatory Visit: Payer: Self-pay

## 2021-05-26 ENCOUNTER — Encounter: Payer: Self-pay | Admitting: Family Medicine

## 2021-05-26 ENCOUNTER — Ambulatory Visit: Payer: Medicare Other | Attending: Family Medicine | Admitting: Family Medicine

## 2021-05-26 VITALS — BP 155/96 | HR 91 | Ht 64.0 in | Wt 149.2 lb

## 2021-05-26 DIAGNOSIS — L989 Disorder of the skin and subcutaneous tissue, unspecified: Secondary | ICD-10-CM | POA: Diagnosis not present

## 2021-05-26 DIAGNOSIS — M79602 Pain in left arm: Secondary | ICD-10-CM

## 2021-05-26 DIAGNOSIS — I10 Essential (primary) hypertension: Secondary | ICD-10-CM

## 2021-05-26 DIAGNOSIS — L680 Hirsutism: Secondary | ICD-10-CM | POA: Diagnosis not present

## 2021-05-26 MED ORDER — SPIRONOLACTONE 25 MG PO TABS: 25.0000 mg | ORAL_TABLET | Freq: Every day | ORAL | 1 refills | Status: DC

## 2021-05-26 MED ORDER — CYCLOBENZAPRINE HCL 10 MG PO TABS: 10.0000 mg | ORAL_TABLET | Freq: Two times a day (BID) | ORAL | 1 refills | Status: DC | PRN

## 2021-05-26 NOTE — Patient Instructions (Signed)
Managing Your Hypertension Hypertension, also called high blood pressure, is when the force of the blood pressing against the walls of the arteries is too strong. Arteries are blood vessels that carry blood from your heart throughout your body. Hypertension forces the heart to work harder to pump blood and may cause the arteries to become narrow or stiff. Understanding blood pressure readings Your personal target blood pressure may vary depending on your medical conditions, your age, and other factors. A blood pressure reading includes a higher number over a lower number. Ideally, your blood pressure should be below 120/80. You should know that: The first, or top, number is called the systolic pressure. It is a measure of the pressure in your arteries as your heart beats. The second, or bottom number, is called the diastolic pressure. It is a measure of the pressure in your arteries as the heart relaxes. Blood pressure is classified into four stages. Based on your blood pressure reading, your health care provider may use the following stages to determine what type of treatment you need, if any. Systolic pressure and diastolic pressure are measured in a unit called mmHg. Normal Systolic pressure: below 120. Diastolic pressure: below 80. Elevated Systolic pressure: 120-129. Diastolic pressure: below 80. Hypertension stage 1 Systolic pressure: 130-139. Diastolic pressure: 80-89. Hypertension stage 2 Systolic pressure: 140 or above. Diastolic pressure: 90 or above. How can this condition affect me? Managing your hypertension is an important responsibility. Over time, hypertension can damage the arteries and decrease blood flow to important parts of the body, including the brain, heart, and kidneys. Having untreated or uncontrolled hypertension can lead to: A heart attack. A stroke. A weakened blood vessel (aneurysm). Heart failure. Kidney damage. Eye damage. Metabolic syndrome. Memory and  concentration problems. Vascular dementia. What actions can I take to manage this condition? Hypertension can be managed by making lifestyle changes and possibly by taking medicines. Your health care provider will help you make a plan to bring your blood pressure within a normal range. Nutrition  Eat a diet that is high in fiber and potassium, and low in salt (sodium), added sugar, and fat. An example eating plan is called the Dietary Approaches to Stop Hypertension (DASH) diet. To eat this way: Eat plenty of fresh fruits and vegetables. Try to fill one-half of your plate at each meal with fruits and vegetables. Eat whole grains, such as whole-wheat pasta, brown rice, or whole-grain bread. Fill about one-fourth of your plate with whole grains. Eat low-fat dairy products. Avoid fatty cuts of meat, processed or cured meats, and poultry with skin. Fill about one-fourth of your plate with lean proteins such as fish, chicken without skin, beans, eggs, and tofu. Avoid pre-made and processed foods. These tend to be higher in sodium, added sugar, and fat. Reduce your daily sodium intake. Most people with hypertension should eat less than 1,500 mg of sodium a day. Lifestyle  Work with your health care provider to maintain a healthy body weight or to lose weight. Ask what an ideal weight is for you. Get at least 30 minutes of exercise that causes your heart to beat faster (aerobic exercise) most days of the week. Activities may include walking, swimming, or biking. Include exercise to strengthen your muscles (resistance exercise), such as weight lifting, as part of your weekly exercise routine. Try to do these types of exercises for 30 minutes at least 3 days a week. Do not use any products that contain nicotine or tobacco, such as cigarettes, e-cigarettes,   and chewing tobacco. If you need help quitting, ask your health care provider. Control any long-term (chronic) conditions you have, such as high  cholesterol or diabetes. Identify your sources of stress and find ways to manage stress. This may include meditation, deep breathing, or making time for fun activities. Alcohol use Do not drink alcohol if: Your health care provider tells you not to drink. You are pregnant, may be pregnant, or are planning to become pregnant. If you drink alcohol: Limit how much you use to: 0-1 drink a day for women. 0-2 drinks a day for men. Be aware of how much alcohol is in your drink. In the [place] one drink equals one 12 oz bottle of beer (355 mL), one 5 oz glass of wine (148 mL), or one 1 oz glass of hard liquor (44 mL). Medicines Your health care provider may prescribe medicine if lifestyle changes are not enough to get your blood pressure under control and if: Your systolic blood pressure is 130 or higher. Your diastolic blood pressure is 80 or higher. Take medicines only as told by your health care provider. Follow the directions carefully. Blood pressure medicines must be taken as told by your health care provider. The medicine does not work as well when you skip doses. Skipping doses also puts you at risk for problems. Monitoring Before you monitor your blood pressure: Do not smoke, drink caffeinated beverages, or exercise within 30 minutes before taking a measurement. Use the bathroom and empty your bladder (urinate). Sit quietly for at least 5 minutes before taking measurements. Monitor your blood pressure at home as told by your health care provider. To do this: Sit with your back straight and supported. Place your feet flat on the floor. Do not cross your legs. Support your arm on a flat surface, such as a table. Make sure your upper arm is at heart level. Each time you measure, take two or three readings one minute apart and record the results. You may also need to have your blood pressure checked regularly by your health care provider. General information Talk with your health care  provider about your diet, exercise habits, and other lifestyle factors that may be contributing to hypertension. Review all the medicines you take with your health care provider because there may be side effects or interactions. Keep all visits as told by your health care provider. Your health care provider can help you create and adjust your plan for managing your high blood pressure. Where to find more information National Heart, Lung, and Blood Institute: www.nhlbi.nih.gov American Heart Association: www.heart.org Contact a health care provider if: You think you are having a reaction to medicines you have taken. You have repeated (recurrent) headaches. You feel dizzy. You have swelling in your ankles. You have trouble with your vision. Get help right away if: You develop a severe headache or confusion. You have unusual weakness or numbness, or you feel faint. You have severe pain in your chest or abdomen. You vomit repeatedly. You have trouble breathing. These symptoms may represent a serious problem that is an emergency. Do not wait to see if the symptoms will go away. Get medical help right away. Call your local emergency services (911 in the [place] Do not drive yourself to the hospital. Summary Hypertension is when the force of blood pumping through your arteries is too strong. If this condition is not controlled, it may put you at risk for serious complications. Your personal target blood pressure may vary depending on   your medical conditions, your age, and other factors. For most people, a normal blood pressure is less than 120/80. Hypertension is managed by lifestyle changes, medicines, or both. Lifestyle changes to help manage hypertension include losing weight, eating a healthy, low-sodium diet, exercising more, stopping smoking, and limiting alcohol. This information is not intended to replace advice given to you by your health care provider. Make sure you discuss any questions  you have with your health care provider. Document Revised: 06/18/2019 Document Reviewed: 05/01/2019 Elsevier Patient Education  2022 Elsevier Inc.  

## 2021-05-26 NOTE — Progress Notes (Signed)
Needs new dermatology referral.  Hair loss.  No BP medication in months.

## 2021-05-26 NOTE — Progress Notes (Signed)
Subjective:  Patient ID: Maria Oliver, female    DOB: October 03, 1960  Age: 60 y.o. MRN: [id number]  CC: Hypertension   HPI Maria Oliver is a 60 y.o. year old female with a history of COPD, hypertension, tobacco abuse, COVID-19 in 02/2021  Interval History: BP is elevated and she has been out of her antihypertensives. At her last visit 3 months ago her antihypertensive (lisinopril) had been on hold due to hypotension noticed during her hospitalization.  The plan was for her to return in 1 month to reevaluate her blood pressure.  She needs an appointment with Dermatology as she has a chronic  lesion on her back which has gotten bigger and darker. Her hair has also thinned out and she would like that looked at. She also has facial hair on her cheeks and upper lip and she keep pulling them out as they sting. Her L arm has been hurting for the last 2 weeks and she denies numbness or tingling in her arm.  He feels like muscle pain.  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 09/09/2016   Generalized headaches    Hernia, inguinal, right    Hypertension    Leg swelling     Past Surgical History:  Procedure Laterality Date   COLON SURGERY  2010   per patient "colon take down"   COLOSTOMY  2010   LAPAROSCOPIC CHOLECYSTECTOMY SINGLE SITE WITH INTRAOPERATIVE CHOLANGIOGRAM N/A 09/09/2016   Procedure: LAPAROSCOPIC CHOLECYSTECTOMY  WITH INTRAOPERATIVE CHOLANGIOGRAM;  Surgeon: Karie Soda, MD;  Location: [facility];  Service: General;  Laterality: N/A;   TUBAL LIGATION      Family History  Problem Relation Age of Onset   Heart disease  Mother 37   Heart failure Mother    Asthma Mother    Emphysema Mother    Alzheimer's disease Father 4   Emphysema Brother    Asthma Brother    Colon cancer Neg Hx     Allergies  Allergen Reactions   Aspirin Nausea Only and Other (See Comments)    GI upset/Hernia     Ciprofloxacin Nausea Only and Other (See Comments)    Tendonitis, joint pain, and nausea.  "Interfered with her HCTZ" (also)   Codeine Nausea And Vomiting and Other (See Comments)    (Also) GI upset/Hernia (CAN TOLERATE DILAUDID & MORPHINE)   Eggs Or Egg-Derived Products Nausea Only    Stomach pains   Fentanyl Nausea Only and Other (See Comments)    Tolerates Dilaudid or morphine much better   Hydrocodone Nausea And Vomiting   Lactose Intolerance (Gi) Nausea And Vomiting and Other (See Comments)    (Also) vertigo   Oxycodone Nausea And Vomiting    Tolerates hydromorphone better   Penicillin G Rash   Penicillins Rash    Has patient had a PCN reaction causing immediate rash, facial/tongue/throat swelling, SOB or lightheadedness with hypotension: Yes Has patient had a PCN reaction causing severe rash involving mucus membranes or skin necrosis: No Has patient had a PCN reaction that required hospitalization No Has patient had a PCN reaction occurring within  the last 10 years: No If all of the above answers are "NO", then may proceed with Cephalosporin use.    Tape Rash and Other (See Comments)    Please try paper tape    Outpatient Medications Prior to Visit  Medication Sig Dispense Refill   acetaminophen (TYLENOL) 500 MG tablet Take 500-1,000 mg by mouth daily as needed for headache, mild pain or fever.     albuterol (VENTOLIN HFA) 108 (90 Base) MCG/ACT inhaler Inhale 2 puffs into the lungs every 6 (six) hours as needed for wheezing or shortness of breath. 8.5 g 0   doxycycline (VIBRA-TABS) 100 MG tablet Take 1 tablet (100 mg total) by mouth 2 (two) times daily. 14 tablet 0   fluticasone (FLONASE) 50 MCG/ACT  nasal spray Place 1 spray into both nostrils daily. 15.8 mL 2   fluticasone-salmeterol (ADVAIR HFA) 115-21 MCG/ACT inhaler Inhale 2 puffs into the lungs 2 (two) times daily. 1 each 12   guaiFENesin (MUCINEX) 600 MG 12 hr tablet Take 1 tablet (600 mg total) by mouth 2 (two) times daily. 30 tablet 0   lactose free nutrition (BOOST) LIQD Take 237 mLs by mouth every other day.     meclizine (ANTIVERT) 12.5 MG tablet Take 2 tablets (25 mg total) by mouth 3 (three) times daily as needed for dizziness or nausea. 30 tablet 0   Multiple Vitamin (MULTIVITAMIN WITH MINERALS) TABS tablet Take 1 tablet by mouth daily.     naproxen (NAPROSYN) 375 MG tablet Take 1 tablet (375 mg total) by mouth 2 (two) times daily. 20 tablet 0   ondansetron (ZOFRAN) 4 MG tablet Take 1 tablet (4 mg total) by mouth every 6 (six) hours. 12 tablet 0   Vitamin D, Ergocalciferol, (DRISDOL) 1.25 MG (50000 UNIT) CAPS capsule Take 1 capsule (50,000 Units total) by mouth every 7 (seven) days. 16 capsule 0   cyclobenzaprine (FLEXERIL) 10 MG tablet Take 1 tablet (10 mg total) by mouth 2 (two) times daily as needed for muscle spasms. 20 tablet 0   lisinopril (ZESTRIL) 20 MG tablet Take 1 tablet (20 mg total) by mouth daily. 90 tablet 3   No facility-administered medications prior to visit.     ROS Review of Systems  Constitutional:  Negative for activity change, appetite change and fatigue.  HENT:  Negative for congestion, sinus pressure and sore throat.   Eyes:  Negative for visual disturbance.  Respiratory:  Negative for cough, chest tightness, shortness of breath and wheezing.   Cardiovascular:  Negative for chest pain and palpitations.  Gastrointestinal:  Negative for abdominal distention, abdominal pain and constipation.  Endocrine: Negative for polydipsia.  Genitourinary:  Negative for dysuria and frequency.  Musculoskeletal:        See HPI  Skin:  Negative for rash.  Neurological:  Negative for tremors, light-headedness and  numbness.  Hematological:  Does not bruise/bleed easily.  Psychiatric/Behavioral:  Negative for agitation and behavioral problems.    Objective:  BP (!) 155/96    Pulse 91    Ht 5\' 4"  (1.626 m)    Wt 149 lb 3.2 oz (67.7 kg)    SpO2 95%    BMI 25.61 kg/m   BP/Weight 05/26/2021 05/06/2021 04/22/2021  Systolic BP 155 139 133  Diastolic BP 96 90 83  Wt. (Lbs) 149.2 150 -  BMI 25.61 25.75 -      Physical Exam Constitutional:      Appearance: She is well-developed.  Cardiovascular:     Rate and Rhythm:  Normal rate.     Heart sounds: Normal heart sounds. No murmur heard. Pulmonary:     Effort: Pulmonary effort is normal.     Breath sounds: Normal breath sounds. No wheezing or rales.  Chest:     Chest wall: No tenderness.  Abdominal:     General: Bowel sounds are normal. There is no distension.     Palpations: Abdomen is soft. There is no mass.     Tenderness: There is no abdominal tenderness.     Hernia: A hernia is present.  Musculoskeletal:        General: Normal range of motion.     Right lower leg: No edema.     Left lower leg: No edema.     Comments: Normal range of motion of bilateral upper extremities No tenderness on palpation of the left upper arm  Skin:    Comments: Erythematous nodule on the right lower back which is nontender  Neurological:     Mental Status: She is alert and oriented to person, place, and time.  Psychiatric:        Mood and Affect: Mood normal.    CMP Latest Ref Rng & Units 05/06/2021 04/22/2021 02/24/2021  Glucose 70 - 99 mg/dL 373(S) 84 87  BUN 8 - 23 mg/dL 12 7(L) 7(L)  Creatinine 0.44 - 1.00 mg/dL 2.87 6.81 1.57  Sodium 135 - 145 mmol/L 135 138 139  Potassium 3.5 - 5.1 mmol/L 4.0 4.8 3.2(L)  Chloride 98 - 111 mmol/L 98 104 104  CO2 22 - 32 mmol/L 25 26 28   Calcium 8.9 - 10.3 mg/dL 9.6 8.9 )  Total Protein 6.5 - 8.1 g/dL 7.2 5.9(L) 5.8(L)  Total Bilirubin 0.3 - 1.2 mg/dL 0.6 0.8 0.4  Alkaline Phos 38 - 126 U/L 67 63 57  AST 15 -  41 U/L 35 31 24  ALT 0 - 44 U/L 33 19 24    Lipid Panel     Component Value Date/Time   CHOL 184 06/28/2017 1157   TRIG 112 06/28/2017 1157   HDL 52 06/28/2017 1157   CHOLHDL 3.5 06/28/2017 1157   CHOLHDL 4.7 05/09/2013 1445   VLDL 32 05/09/2013 1445   LDLCALC 110 (H) 06/28/2017 1157    CBC    Component Value Date/Time   WBC 6.1 05/06/2021 1450   RBC 5.81 (H) 05/06/2021 1450   HGB 16.4 (H) 05/06/2021 1450   HGB 14.3 11/02/2017 1621   HCT 50.8 (H) 05/06/2021 1450   HCT 44.0 11/02/2017 1621   PLT 265 05/06/2021 1450   PLT 375 11/02/2017 1621   MCV 87.4 05/06/2021 1450   MCV 87 11/02/2017 1621   MCH 28.2 05/06/2021 1450   MCHC 32.3 05/06/2021 1450   RDW 14.1 05/06/2021 1450   RDW 14.6 11/02/2017 1621   LYMPHSABS 2.9 04/22/2021 1523   LYMPHSABS 2.5 11/02/2017 1621   MONOABS 0.6 04/22/2021 1523   EOSABS 0.3 04/22/2021 1523   EOSABS 0.3 11/02/2017 1621   BASOSABS 0.1 04/22/2021 1523   BASOSABS 0.0 11/02/2017 1621    Lab Results  Component Value Date   HGBA1C 5.6 12/10/2020    Assessment & Plan:  1. Essential hypertension Uncontrolled She was previously on lisinopril which was discontinued during hospitalization due to hypotension which occurred with a COVID-19 infection Will place on spironolactone which will also help with hirsutism Check potassium in 2 weeks Counseled on blood pressure goal of less than 130/80, low-sodium, DASH diet, medication compliance, 150 minutes of moderate intensity  exercise per week. Discussed medication compliance, adverse effects. - spironolactone (ALDACTONE) 25 MG tablet; Take 1 tablet (25 mg total) by mouth daily.  Dispense: 90 tablet; Refill: 1 - Basic Metabolic Panel; Future  2. Hirsutism Spironolactone initiated - Ambulatory referral to Dermatology - spironolactone (ALDACTONE) 25 MG tablet; Take 1 tablet (25 mg total) by mouth daily.  Dispense: 90 tablet; Refill: 1  3. Skin lesion She would like this evaluated to rule out  malignancy as she has never had a skin exam - Ambulatory referral to Dermatology  4. Left arm pain Likely muscular etiology - cyclobenzaprine (FLEXERIL) 10 MG tablet; Take 1 tablet (10 mg total) by mouth 2 (two) times daily as needed for muscle spasms.  Dispense: 60 tablet; Refill: 1   Health Care Maintenance: Declines Pneumovax Meds ordered this encounter  Medications   spironolactone (ALDACTONE) 25 MG tablet    Sig: Take 1 tablet (25 mg total) by mouth daily.    Dispense:  90 tablet    Refill:  1    Discontinue lisinopril   cyclobenzaprine (FLEXERIL) 10 MG tablet    Sig: Take 1 tablet (10 mg total) by mouth 2 (two) times daily as needed for muscle spasms.    Dispense:  60 tablet    Refill:  1    Follow-up: Return in about 3 months (around 08/24/2021) for Chronic medical conditions.       Hoy Register, MD, FAAFP. [facility] [place], [place] [phone]   05/26/2021, 4:59 PM

## 2021-07-02 ENCOUNTER — Encounter: Payer: Self-pay | Admitting: *Deleted

## 2021-07-02 NOTE — Telephone Encounter (Signed)
This encounter was created in error - please disregard.

## 2021-07-10 ENCOUNTER — Other Ambulatory Visit: Payer: Self-pay | Admitting: *Deleted

## 2021-07-10 NOTE — Patient Outreach (Signed)
[facility]) Care Management  07/10/2021  Maria Oliver 1961-05-30 [id number]   RN Health Coach attempted follow up outreach call to patient.  Patient was unavailable. HIPPA compliance voicemail message left with return callback number.  Plan: RN will call patient again within 30 days.  Gean Maidens BSN RN [facility] Care Management [phone]

## 2021-08-11 ENCOUNTER — Other Ambulatory Visit: Payer: Self-pay | Admitting: *Deleted

## 2021-08-11 NOTE — Patient Outreach (Signed)
02/28/23[facility] Care Management RN Health Coach Note   08/11/2021 Name:  Maria Oliver MRN:  [id number] DOB:  1960/07/28  Summary: Per patient she is breathing ok with low activity. [name]. She is using inhalers as per ordered. Patient is aware of monitoring the air quality. Appetite is good. Patient is resting frequently.  Recommendations/Changes made from today's visit: Continue monitoring air quality Medication adherence    Subjective: Maria Oliver is an 61 y.o. year old female who is a primary patient of Hoy RegisterNewlin, Enobong, MD. The care management team was consulted for assistance with care management and/or care coordination needs.    RN Health Coach completed Telephone Visit today.   Objective:  Medications Reviewed Today     Reviewed by Hoy RegisterNewlin, Enobong, MD (Physician) on 05/26/21 at 1654  Med List Status: <None>   Medication Order Taking? Sig Documenting Provider Last Dose Status Informant  acetaminophen (TYLENOL) 500 MG tablet [id number] Yes Take 500-1,000 mg by mouth daily as needed for headache, mild pain or fever. [provider] Taking Active Self  albuterol (VENTOLIN HFA) 108 (90 Base) MCG/ACT inhaler [id number] Yes Inhale 2 puffs into the lungs every 6 (six) hours as needed for wheezing or shortness of breath. Anders SimmondsMcClung, Angela M, PA-C Taking Active Self  cyclobenzaprine (FLEXERIL) 10 MG tablet [id number]  Take 1 tablet (10 mg total) by mouth 2 (two) times daily as needed for muscle spasms. Hoy RegisterNewlin, Enobong, MD  Active   doxycycline (VIBRA-TABS) 100 MG tablet [id number] Yes Take 1 tablet (100 mg total) by mouth 2 (two) times daily. Cobb, Ruby ColaKatherine V, NP Taking Active   fluticasone (FLONASE) 50 MCG/ACT nasal spray [id number] Yes Place 1 spray into both nostrils daily. Noemi Chapelobb, Katherine V, NP Taking Active   fluticasone-salmeterol (ADVAIR HFA) 536-64115-21 MCG/ACT inhaler [id number] Yes Inhale 2 puffs into the lungs 2 (two) times  daily. Leroy SeaSingh, Prashant K, MD Taking Active Self  guaiFENesin (MUCINEX) 600 MG 12 hr tablet [id number] Yes Take 1 tablet (600 mg total) by mouth 2 (two) times daily. Valinda HoarWhite, Adrienne R, NP Taking Active Self           Med Note Antony Madura(DALY, Melody ComasKATHY N   Wed May 06, 2021  4:10 PM)    lactose free nutrition (BOOST) LIQD [id number] Yes Take 237 mLs by mouth every other day. [provider] Taking Active Self  meclizine (ANTIVERT) 12.5 MG tablet [id number] Yes Take 2 tablets (25 mg total) by mouth 3 (three) times daily as needed for dizziness or nausea. Noemi Chapelobb, Katherine V, NP Taking Active   Multiple Vitamin (MULTIVITAMIN WITH MINERALS) TABS tablet [id number] Yes Take 1 tablet by mouth daily. [provider] Taking Active Self           Med Note Earlene Plater(DAVIS, Lowanda FosterBRITTANY   Tue Nov 22, 2017 12:56 PM)    naproxen (NAPROSYN) 375 MG tablet [id number] Yes Take 1 tablet (375 mg total) by mouth 2 (two) times daily. Linwood DibblesKnapp, Jon, MD Taking Active Self  ondansetron [facility](ZOFRAN) 4 MG tablet [id number] Yes Take 1 tablet (4 mg total) by mouth every 6 (six) hours. Virgina Norfolkuratolo, Adam, DO Taking Active   spironolactone (ALDACTONE) 25 MG tablet [id number] Yes Take 1 tablet (25 mg total) by mouth daily. Hoy RegisterNewlin, Enobong, MD  Active   Vitamin D, Ergocalciferol, (DRISDOL) 1.25 MG (50000 UNIT) CAPS capsule [id number] Yes Take 1 capsule (50,000 Units total) by mouth every 7 (seven) days. Anders SimmondsMcClung, Angela M, PA-C Taking Active Self  SDOH:  (Social Determinants of Health) assessments and interventions performed:  SDOH Interventions    Flowsheet Row Most Recent Value  SDOH Interventions   Housing Interventions Intervention Not Indicated       Care Plan  Review of patient past medical history, allergies, medications, health status, including review of consultants reports, laboratory and other test data, was performed as part of comprehensive evaluation for care management services.   Care Plan : Wellness (Adult)  Updates  made by [place] Vierling, Dennard Schaumann, RN since 08/11/2021 12:00 AM     Problem: Healthy Nutrition (Wellness) Resolved 08/11/2021  Note:   [id number] Resolving due to duplicate goal     Goal: Healthy Nutrition Achieved Completed 08/11/2021  Start Date: 03/14/2020  Expected End Date: 06/12/2021  Recent Progress: On track  Priority: Medium  Note:   Evidence-based guidance:  Assess patient perspective on healthy weight, weight loss or weight gain, motivation and readiness for change.  Recommend or set healthy weight goal based on body mass index.  Review current dietary intake and exercise levels.  Encourage small steps toward making change to eating and exercising.  Provide individualized medical nutrition therapy.   Notes:  [id number] Patient is eating less sodium in diet    Task: Alleviate Barriers to Healthy Eating Completed 08/11/2021  Due Date: 06/12/2021  Note:   Care Management Activities:    - making healthy food choices encouraged    Notes:  [id number] Patient is eating decrease sodium in diet    Care Plan : COPD (Adult)  Updates made by Luella Cook, RN since 08/11/2021 12:00 AM     Problem: Psychological Adjustment to Diagnosis (COPD) Resolved 08/11/2021  Priority: Medium  Onset Date: 03/14/2020  Note:   [id number] Resolving due to duplicate goal     Long-Range Goal: Adjustment to Disease Achieved Completed 08/11/2021  Start Date: 03/14/2020  Expected End Date: 07/13/2021  Recent Progress: On track  Priority: Medium  Note:   Evidence-based guidance:  Explore the patient and family's understanding of the disease; use open-ended questions to encourage patient and family to share what is important to them.  Assess and provide support around experience of loss and lifestyle adjustments, such as loss of function, roles, social ties, independence and hobbies.  Support adjustment to new normal with focus on maintaining daily life as closely as possible to life before  chronic obstructive pulmonary disease (COPD) diagnosis.  Express empathy; listen actively by encouraging the patient and family to express feelings, concerns and fears; ask questions and encourage open communication regarding embarrassing or disturbing topics.  Assess for factors that may impact coping or adjustment, such as a preexisting mental health condition, prognosis, lack of social support, debilitating disease or financial difficulty that include no or insufficient insurance coverage.  Assess and address fear or anxiety related to dyspnea or perceived stigma of a noninfectious cough.  Prepare for re-entry into work, school and social activities; establish links or collaborate with mental health resources in the community, such as peer support or advocacy groups.  Assess anxiety, feelings of panic when breathing becomes labored, as well as impact on family/caregiver; consider cognitive behavioral therapy, deep breathing, meditation, visualization, photo or light therapy.  Encourage advanced care planning discussion to clarify goals of care; palliative care or hospice may be considered for advanced COPD patients if it aligns with patient's goals of care.  Explore common risk factors for depression, such as personal or family history of depression, substance use and stressors that include missed work,  losing ability to do what patient was used to doing, changes in appearance, physical   or sexual abuse.  Destigmatize depression by presenting it as a problem requiring treatment, rather than a personal weakness.  Use a validated screening tool to identify level of suicide risk. If at risk, develop safety plan and implement additional safety measures based on level of risk, such as behavioral health referral or immediate assessment.   Notes:     Task: Support Psychosocial Response to Chronic Obstructive Pulmonary Disease Completed 08/11/2021  Due Date: 07/13/2021  Note:   Care Management Activities:     - complementary therapy use encouraged - decision-making supported - emotional support provided - verbalization of feelings encouraged    Notes:  Patient had test and noted spot on lung. To be followed up in 6 months by pulmonary    Problem: Disease Progression (COPD) Resolved 08/11/2021  Priority: Medium  Onset Date: 03/14/2020  Note:   [id number] Resolving due to duplicate goal     Long-Range Goal: Disease Progression Minimized or Managed Completed 08/11/2021  Start Date: 03/14/2020  Expected End Date: 07/13/2021  Recent Progress: On track  Priority: Medium  Note:   Evidence-based guidance:  Identify current smoking/tobacco use; provide smoking cessation intervention.  Assess symptom control by the frequency and type of symptoms, reliever use and activity limitation at every encounter.  Assess risk for exacerbation (flare up) by evaluating spirometry, pulse oximetry, reliever use, presentation of symptoms and activity limitation; anticipate treatment adjustment based on risks and resources.  Develop and/or review and reinforce use of COPD rescue (action) plan even when symptoms are controlled or infrequent.  Ask patient to bring inhaler to all visits; assess and reinforce correct technique; address barriers to proper inhaler use, such as older age, use of multiple devices and lack of understanding.   Identify symptom triggers, such as smoking, virus, weather change, emotional upset, exercise, obesity and environmental allergen; consider reduction of work-exposure versus elimination to avoid compromising employment.  Correlate presentation to comorbidity, such as diabetes, heart failure, obstructive sleep apnea, depression and anxiety, which may worsen symptoms.  Prepare for individualized pharmacologic therapy that may include LABA (long-acting beta-2 agonist), LAMA (long-acting muscarinic antagonist), SABA (short-acting beta-2 agonist) oral or inhaled corticosteroid.  Promote  participation in pulmonary rehabilitation for breathing exercises, skills training, improved exercise capacity, mood and quality of life; address barriers to participation.  Promote physical activity or exercise to improve or maintain exercise capacity, based on tolerance that may include walking, water exercise, cycling or limb muscle strength training.  Promote use of energy conservation and activity pacing techniques.  Promote use of breathing and coughing techniques, such as inspiratory muscle training, pursed-lip breathing, diaphragmatic breathing, pranayama yoga breathing or huff cough.  Screen for malnutrition risk factors, such as unintentional weight loss and poor oral intake; refer to dietitian if identified.  Consider recommendation for oral drink supplement or multivitamin and mineral supplements if suspect inadequate oral intake or micronutrient deficiencies.   Screen for obstructive sleep apnea; prepare patient for polysomnography based on risk and presentation.  Prepare patient for use of long-term oxygen and noninvasive ventilation to relieve hypercapnia, hypoxemia, obstructive sleep apnea and reduce work of breathing.  Prepare patient with worsening disease for surgical interventions that may include bronchoscopy, lung volume reduction surgery, bullectomy or lung transplantation.   Notes:     Task: Alleviate Barriers to COPD Management Completed 08/11/2021  Due Date: 07/13/2021  Note:   Care Management Activities:    - activity  or exercise based on tolerance encouraged - breathing techniques encouraged - inhaler technique observed - medication-adherence assessment completed - rescue (action) plan reviewed - smoking counseling provided - self-awareness of symptom triggers encouraged    Notes:  Discussed with patient the importance of rinsing mouth with use of inhalers    Problem: Symptom Exacerbation (COPD) Resolved 08/11/2021  Priority: High  Onset Date: 03/14/2020  Note:    [id number] Resolving due to duplicate goal     Goal: Symptom Exacerbation Prevented or Minimized Completed 08/11/2021  Start Date: 03/14/2020  Expected End Date: 07/13/2021  Recent Progress: On track  Priority: Medium  Note:   Evidence-based guidance:  Monitor for signs of respiratory infection, including changes in sputum color, volume and thickness, as well as fever.  Encourage infection prevention strategies that may include prophylactic antibiotic therapy for patients with history of frequent exacerbations or antibiotic administration during exacerbation based on presentation, risk and benefit.  Encourage receipt of influenza and pneumococcal vaccine.   Prepare patient for use of home long-term oxygen therapy in presence of sever resting hypoxemia.  Prepare patients for laboratory studies or diagnostic exams, such as spirometry, pulse oximetry and arterial blood gas based on current symptoms, risk factors and presentation.  Assess barriers and manage adherence, including inhaler technique and persistent trigger exposure; encourage adherence, even when symptoms are controlled or infrequent.  Assess and monitor for signs/symptoms of psychosocial concerns, such as shortness of breath-anxiety cycle or depression that may impact stability of symptoms.  Identify economic resources, sociocultural beliefs, social factors and health literacy that may interfere with adherence.  Promote lifestyle changes when needed, including regular physical activity based on tolerance, weight loss, healthy eating and stress management.  Consider referral to nurse or community health worker or home-visiting program for intensive support and education (disease-management program).  Increase frequency of follow-up following exacerbation or hospitalization; consider transition of care interventions, such as hospital visit, home visit, telephone follow-up, review of discharge summary and resource referrals.   Notes:   [id number] patient had episode on 03/25 of coughing and thick sputum. Started on Atbx and prednisone. Symptoms better today    Task: Identify and Minimize Risk of COPD Exacerbation Completed 08/11/2021  Due Date: 07/13/2021  Note:   Care Management Activities:    - breathing techniques encouraged - healthy lifestyle promoted - rescue (action) plan reviewed - signs/symptoms of infection reviewed - signs/symptoms of worsening disease assessed - symptom triggers identified - treatment plan reviewed    Notes:     Care Plan : RN Care Manager Plan of Care  Updates made by Dexton Zwilling, Dennard SchaumannFrances H, RN since 08/11/2021 12:00 AM     Problem: Knowledge Deficit Related to COPD and Care Coordination   Priority: High     Long-Range Goal: Development Plan of Care for Management of COPD   Start Date: 08/11/2021  Expected End Date: 06/12/2022  Priority: High  Note:   Current Barriers:  Knowledge Deficits related to plan of care for management of COPD   RNCM Clinical Goal(s):  Patient will verbalize understanding of plan for management of COPD as evidenced by continuation of monitoring for COPD exacerbation symptoms   through collaboration with RN Care manager, provider, and care team.   Interventions: Inter-disciplinary care team collaboration (see longitudinal plan of care) Evaluation of current treatment plan related to  self management and patient's adherence to plan as established by provider   COPD Interventions:  (Status:  Goal on track:  Yes.) Long Term Goal Provided patient  with basic written and verbal COPD education on self care/management/and exacerbation prevention Advised patient to track and manage COPD triggers Provided written and verbal instructions on pursed lip breathing and utilized returned demonstration as teach back Advised patient to self assesses COPD action plan zone and make appointment with provider if in the yellow zone for 48 hours without improvement  Patient  Goals/Self-Care Activities: Take all medications as prescribed Attend all scheduled provider appointments Call pharmacy for medication refills 3-7 days in advance of running out of medications Perform all self care activities independently  Perform IADL's (shopping, preparing meals, housekeeping, managing finances) independently Call provider office for new concerns or questions  call the Suicide and Crisis Lifeline: 988 if experiencing a Mental Health or Behavioral Health Crisis  eliminate smoking in my home identify and remove indoor air pollutants limit outdoor activity during cold weather listen for public air quality announcements every day develop a rescue plan eliminate symptom triggers at home follow rescue plan if symptoms flare-up  Follow Up Plan:  Telephone follow up appointment with care management team member scheduled for:  June 2023 The patient has been provided with contact information for the care management team and has been advised to call with any health related questions or concerns.        Plan: Telephone follow up appointment with care management team member scheduled for:  November 12, 2021 The patient has been provided with contact information for the care management team and has been advised to call with any health related questions or concerns.   Gean Maidens BSN RN [facility] Care Management [phone]

## 2021-08-11 NOTE — Patient Instructions (Addendum)
Visit Information  Thank you for taking time to visit with me today. Please don't hesitate to contact me if I can be of assistance to you before our next scheduled telephone appointment.  Following are the goals we discussed today:  Current Barriers:  Knowledge Deficits related to plan of care for management of COPD   RNCM Clinical Goal(s):  Patient will verbalize understanding of plan for management of COPD as evidenced by continuation of monitoring for COPD exacerbation symptoms  through collaboration with RN Care manager, provider, and care team.   Interventions: Inter-disciplinary care team collaboration (see longitudinal plan of care) Evaluation of current treatment plan related to  self management and patient's adherence to plan as established by provider   COPD Interventions:  (Status:  Goal on track:  Yes.) Long Term Goal Provided patient with basic written and verbal COPD education on self care/management/and exacerbation prevention Advised patient to track and manage COPD triggers Provided written and verbal instructions on pursed lip breathing and utilized returned demonstration as teach back Advised patient to self assesses COPD action plan zone and make appointment with provider if in the yellow zone for 48 hours without improvement  Patient Goals/Self-Care Activities: Take all medications as prescribed Attend all scheduled provider appointments Call pharmacy for medication refills 3-7 days in advance of running out of medications Perform all self care activities independently  Perform IADL's (shopping, preparing meals, housekeeping, managing finances) independently Call provider office for new concerns or questions  call the Suicide and Crisis Lifeline: 988 if experiencing a Mental Health or [place]  eliminate smoking in my home identify and remove indoor air pollutants limit outdoor activity during cold weather listen for public air quality  announcements every day develop a rescue plan eliminate symptom triggers at home follow rescue plan if symptoms flare-up  Follow Up Plan:  Telephone follow up appointment with care management team member scheduled for:  June 2023 The patient has been provided with contact information for the care management team and has been advised to call with any health related questions or concerns.    Our next appointment is by telephone on June 2023  Please call Johny Shock [phone] if you need to cancel or reschedule your appointment.   Please call the Suicide and Crisis Lifeline: 988 if you are experiencing a Mental Health or [place] or need someone to talk to.  The patient verbalized understanding of instructions, educational materials, and care plan provided today and agreed to receive a mailed copy of patient instructions, educational materials, and care plan.   Telephone follow up appointment with care management team member scheduled for: The patient has been provided with contact information for the care management team and has been advised to call with any health related questions or concerns.   [place] Care Management [phone]

## 2021-08-25 ENCOUNTER — Encounter: Payer: Self-pay | Admitting: Family Medicine

## 2021-08-25 ENCOUNTER — Ambulatory Visit: Payer: Medicare Other | Attending: Family Medicine | Admitting: Family Medicine

## 2021-08-25 ENCOUNTER — Other Ambulatory Visit: Payer: Self-pay

## 2021-08-25 VITALS — BP 178/103 | HR 75 | Ht 64.0 in | Wt 143.0 lb

## 2021-08-25 DIAGNOSIS — F17210 Nicotine dependence, cigarettes, uncomplicated: Secondary | ICD-10-CM

## 2021-08-25 DIAGNOSIS — G4709 Other insomnia: Secondary | ICD-10-CM

## 2021-08-25 DIAGNOSIS — F419 Anxiety disorder, unspecified: Secondary | ICD-10-CM | POA: Diagnosis not present

## 2021-08-25 DIAGNOSIS — J439 Emphysema, unspecified: Secondary | ICD-10-CM | POA: Diagnosis not present

## 2021-08-25 DIAGNOSIS — I10 Essential (primary) hypertension: Secondary | ICD-10-CM | POA: Diagnosis not present

## 2021-08-25 MED ORDER — NICOTINE 14 MG/24HR TD PT24
14.0000 mg | MEDICATED_PATCH | Freq: Every day | TRANSDERMAL | 3 refills | Status: DC
Start: 2021-08-25 — End: 2023-01-31

## 2021-08-25 MED ORDER — LISINOPRIL 20 MG PO TABS: 20.0000 mg | ORAL_TABLET | Freq: Every day | ORAL | 3 refills | Status: DC

## 2021-08-25 MED ORDER — NICOTINE 14 MG/24HR TD PT24: 14.0000 mg | MEDICATED_PATCH | Freq: Every day | TRANSDERMAL | 0 refills | Status: DC

## 2021-08-25 MED ORDER — HYDROXYZINE HCL 25 MG PO TABS: 25.0000 mg | ORAL_TABLET | Freq: Three times a day (TID) | ORAL | 1 refills | Status: DC | PRN

## 2021-08-25 NOTE — Progress Notes (Signed)
Restarted lisinopril 10MG  ?Left shoulder pain. ?

## 2021-08-25 NOTE — Progress Notes (Signed)
? ?Subjective:  ?Patient ID: Maria Oliver, female    DOB: Feb 22, 1961  Age: 61 y.o. MRN: [id number] ? ?CC: Hypertension ? ? ?HPI ?Maria Oliver is a 60 y.o. year old female with a history of COPD, hypertension, tobacco abuse (smoking since elementary school), COVID-19 in 02/2021 ? ?Interval History: ?At her last visit she had complained of hirsutism and her hair falling out and she was placed on spironolactone for her hypertension and treatment of hirsutism.  She was also referred to dermatology for evaluation. ?She discontinued Spironolactone and went back to her Lisinopril 10mg  as she continued to notice her hair fall out and was of the opinion the spironolactone could be causing it.  Blood pressures at home however have remained elevated in the [id number] systolics.  In the fall of last year she did have hypotension when she had COVID and her antihypertensives were discontinued altogether. ? ?When she wakes up she has a rapid HR like she has a panic attack, she has noticed her neighbors dying and she endorses presence of anxiety. ?Gets 6-7 hours on interrupted sleep. Takes caffeine into the late afternoons. ?Smoking 1ppd. ?Low-dose lung CT from 07/2020 revealed: ?IMPRESSION: ?1. Lung-RADS 2, benign appearance or behavior. Continue annual ?screening with low-dose chest CT without contrast in 12 months. ?2. Aortic Atherosclerosis (ICD10-I70.0) and Emphysema (ICD10-J43.9). ?3. Coronary artery calcifications. ?  ? ?CTA angio chest from 02/2021 revealed: ?IMPRESSION: ?No evidence of thoracoabdominal aortic aneurysm or dissection. ?  ?Stable ground-glass nodules in the lungs bilaterally, measuring up ?to 15 mm in the posterior right upper lobe. Continued attention on ?follow-up is suggested at the time of patient's low-dose lung cancer ?screening. ?  ?Large lower midline ventral hernia containing multiple loops of ?nondilated small bowel and sigmoid colon, without associated ?inflammatory changes. No evidence of bowel  obstruction. ?  ?Additional stable ancillary findings as above. ?  ?  ? ?Past Medical History:  ?Diagnosis Date  ? Abdominal pain   ? Anxiety   ? Arthritis   ? Asthma   ? Chronic cholecystitis with calculus s/p lap cholecystectomy 09/09/2016 09/09/2016  ? Complication of anesthesia   ? slow to arouse post operatively   ? COPD (chronic obstructive pulmonary disease) ([place])   ? 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 Generalized headaches   ? Hernia, inguinal, right   ? Hypertension   ? Leg swelling   ? ? ?Past Surgical History:  ?Procedure Laterality Date  ? COLON SURGERY  2010  ? per patient "colon take down"  ? COLOSTOMY  2010  ? LAPAROSCOPIC CHOLECYSTECTOMY SINGLE SITE WITH INTRAOPERATIVE CHOLANGIOGRAM N/A 09/09/2016  ? Procedure: LAPAROSCOPIC CHOLECYSTECTOMY  WITH INTRAOPERATIVE CHOLANGIOGRAM;  Surgeon: Michael Boston, MD;  Location: [facility];  Service: General;  Laterality: N/A;  ? TUBAL LIGATION    ? ? ?Family History  ?Problem Relation Age of Onset  ? Heart disease Mother 68  ? Heart failure Mother   ? Asthma Mother   ? Emphysema Mother   ? Alzheimer's disease Father 38  ? Emphysema Brother   ? Asthma Brother   ? Colon cancer Neg Hx   ? ? ?Social History  ? ?Socioeconomic History  ? Marital status: Widowed  ?  Spouse name: Not on file  ? Number of children: 3  ? Years of education: Not on file  ? Highest education level: Not on file  ?Occupational History  ? Occupation: disabled  ?Tobacco Use  ? Smoking status: Every Day  ?  Packs/day: 1.00  ?  Years: 47.00  ?  Pack years: 47.00  ?  Types: Cigarettes  ? Smokeless tobacco: Never  ? Tobacco comments:  ?  1/2 ppd 02/25/20   ?Vaping Use  ? Vaping Use: Never used  ?Substance and Sexual Activity  ? Alcohol use: No  ?  Alcohol/week: 0.0 standard drinks  ? Drug use: No  ? Sexual activity: Not Currently  ?  Birth control/protection: Surgical, None  ?Other Topics Concern  ? Not on file  ?Social History Narrative  ? Not on file  ? ?Social  Determinants of Health  ? ?Financial Resource Strain: Not on file  ?Food Insecurity: No Food Insecurity  ? Worried About Charity fundraiser in the Last Year: Never true  ? Ran Out of Food in the Last Year: Never true  ?Transportation Needs: No Transportation Needs  ? Lack of Transportation (Medical): No  ? Lack of Transportation (Non-Medical): No  ?Physical Activity: Insufficiently Active  ? Days of Exercise per Week: 5 days  ? Minutes of Exercise per Session: 20 min  ?Stress: Not on file  ?Social Connections: Unknown  ? Frequency of Communication with Friends and Family: Three times a week  ? Frequency of Social Gatherings with Friends and Family: Three times a week  ? Attends Religious Services: Never  ? Active Member of Clubs or Organizations: No  ? Attends Archivist Meetings: Never  ? Marital Status: Not on file  ? ? ?Allergies  ?Allergen Reactions  ? Aspirin Nausea Only and Other (See Comments)  ?  GI upset/Hernia  ?  ? Ciprofloxacin Nausea Only and Other (See Comments)  ?  Tendonitis, joint pain, and nausea.  "Interfered with her HCTZ" (also)  ? Codeine Nausea And Vomiting and Other (See Comments)  ?  (Also) GI upset/Hernia (CAN TOLERATE DILAUDID & MORPHINE)  ? Eggs Or Egg-Derived Products Nausea Only  ?  Stomach pains  ? Fentanyl Nausea Only and Other (See Comments)  ?  Tolerates Dilaudid or morphine much better  ? Hydrocodone Nausea And Vomiting  ? Lactose Intolerance (Gi) Nausea And Vomiting and Other (See Comments)  ?  (Also) vertigo  ? Oxycodone Nausea And Vomiting  ?  Tolerates hydromorphone better  ? Penicillin G Rash  ? Penicillins Rash  ?  Has patient had a PCN reaction causing immediate rash, facial/tongue/throat swelling, SOB or lightheadedness with hypotension: Yes ?Has patient had a PCN reaction causing severe rash involving mucus membranes or skin necrosis: No ?Has patient had a PCN reaction that required hospitalization No ?Has patient had a PCN reaction occurring within the last 10  years: No ?If all of the above answers are "NO", then may proceed with Cephalosporin use. ?  ? Tape Rash and Other (See Comments)  ?  Please try paper tape  ? ? ?Outpatient Medications Prior to Visit  ?Medication Sig Dispense Refill  ? acetaminophen (TYLENOL) 500 MG tablet Take 500-1,000 mg by mouth daily as needed for headache, mild pain or fever.    ? albuterol (VENTOLIN HFA) 108 (90 Base) MCG/ACT inhaler Inhale 2 puffs into the lungs every 6 (six) hours as needed for wheezing or shortness of breath. 8.5 g 0  ? cyclobenzaprine (FLEXERIL) 10 MG tablet Take 1 tablet (10 mg total) by mouth 2 (two) times daily as needed for muscle spasms. 60 tablet 1  ? fluticasone (FLONASE) 50 MCG/ACT nasal spray Place 1 spray into both nostrils daily. 15.8 mL 2  ? fluticasone-salmeterol (ADVAIR  HFA) 115-21 MCG/ACT inhaler Inhale 2 puffs into the lungs 2 (two) times daily. 1 each 12  ? lactose free nutrition (BOOST) LIQD Take 237 mLs by mouth every other day.    ? meclizine (ANTIVERT) 12.5 MG tablet Take 2 tablets (25 mg total) by mouth 3 (three) times daily as needed for dizziness or nausea. 30 tablet 0  ? Multiple Vitamin (MULTIVITAMIN WITH MINERALS) TABS tablet Take 1 tablet by mouth daily.    ? naproxen (NAPROSYN) 375 MG tablet Take 1 tablet (375 mg total) by mouth 2 (two) times daily. 20 tablet 0  ? ondansetron (ZOFRAN) 4 MG tablet Take 1 tablet (4 mg total) by mouth every 6 (six) hours. 12 tablet 0  ? guaiFENesin (MUCINEX) 600 MG 12 hr tablet Take 1 tablet (600 mg total) by mouth 2 (two) times daily. (Patient not taking: Reported on 08/25/2021) 30 tablet 0  ? Vitamin D, Ergocalciferol, (DRISDOL) 1.25 MG (50000 UNIT) CAPS capsule Take 1 capsule (50,000 Units total) by mouth every 7 (seven) days. (Patient not taking: Reported on 08/25/2021) 16 capsule 0  ? doxycycline (VIBRA-TABS) 100 MG tablet Take 1 tablet (100 mg total) by mouth 2 (two) times daily. (Patient not taking: Reported on 08/25/2021) 14 tablet 0  ? spironolactone  (ALDACTONE) 25 MG tablet Take 1 tablet (25 mg total) by mouth daily. (Patient not taking: Reported on 08/25/2021) 90 tablet 1  ? ?No facility-administered medications prior to visit.  ? ? ? ?ROS ?Review of Systems  ?Co

## 2021-08-26 ENCOUNTER — Other Ambulatory Visit: Payer: Self-pay | Admitting: Family Medicine

## 2021-08-26 LAB — CMP14+EGFR
ALT: 14 IU/L (ref 0–32)
AST: 16 IU/L (ref 0–40)
Albumin/Globulin Ratio: 2.6 — ABNORMAL HIGH (ref 1.2–2.2)
Albumin: 4.6 g/dL (ref 3.8–4.8)
Alkaline Phosphatase: 85 IU/L (ref 44–121)
BUN/Creatinine Ratio: 11 — ABNORMAL LOW (ref 12–28)
BUN: 7 mg/dL — ABNORMAL LOW (ref 8–27)
Bilirubin Total: 0.2 mg/dL (ref 0.0–1.2)
CO2: 28 mmol/L (ref 20–29)
Calcium: 9.5 mg/dL (ref 8.7–10.3)
Chloride: 102 mmol/L (ref 96–106)
Creatinine, Ser: 0.63 mg/dL (ref 0.57–1.00)
Globulin, Total: 1.8 g/dL (ref 1.5–4.5)
Glucose: 69 mg/dL — ABNORMAL LOW (ref 70–99)
Potassium: 4.4 mmol/L (ref 3.5–5.2)
Sodium: 142 mmol/L (ref 134–144)
Total Protein: 6.4 g/dL (ref 6.0–8.5)
eGFR: 100 mL/min/{1.73_m2} (ref >59)

## 2021-08-26 LAB — LP+NON-HDL CHOLESTEROL
Cholesterol, Total: 219 mg/dL — ABNORMAL HIGH (ref 100–199)
HDL: 55 mg/dL (ref >39)
LDL Chol Calc (NIH): 145 mg/dL — ABNORMAL HIGH (ref 0–99)
Total Non-HDL-Chol (LDL+VLDL): 164 mg/dL — ABNORMAL HIGH (ref 0–129)
Triglycerides: 105 mg/dL (ref 0–149)
VLDL Cholesterol Cal: 19 mg/dL (ref 5–40)

## 2021-08-26 MED ORDER — ATORVASTATIN CALCIUM 20 MG PO TABS: 20.0000 mg | ORAL_TABLET | Freq: Every day | ORAL | 6 refills | Status: DC

## 2021-09-08 ENCOUNTER — Other Ambulatory Visit: Payer: Self-pay

## 2021-09-08 ENCOUNTER — Ambulatory Visit (HOSPITAL_COMMUNITY)
Admission: RE | Admit: 2021-09-08 | Discharge: 2021-09-08 | Disposition: A | Discharge status: Home / Self Care | Payer: Medicare Other | Source: Ambulatory Visit | Attending: Family Medicine | Admitting: Family Medicine

## 2021-09-08 DIAGNOSIS — F17210 Nicotine dependence, cigarettes, uncomplicated: Secondary | ICD-10-CM | POA: Insufficient documentation

## 2021-09-15 ENCOUNTER — Ambulatory Visit: Payer: Self-pay | Admitting: *Deleted

## 2021-09-15 NOTE — Telephone Encounter (Addendum)
?  Chief Complaint: bronchitis ?Symptoms: coughing, wheezing ?Frequency: a lot ?Pertinent Negatives: Patient denies fever ?Disposition: [] ED /[] Urgent Care (no appt availability in office) / [x] Appointment(In office/virtual)/ []  [facility]/ [] Home Care/ [] Refused Recommended Disposition /[] [facility]/ []  Follow-up with PCP ?Additional Notes: Visit scheduled for tomorrow. Pt also stated she could not get into MyChart to see her CT results. Gave her results and Dr. comments. Reset her MyChart. ? ?Reason for Disposition ? [1] MODERATE longstanding difficulty breathing (e.g., speaks in phrases, SOB even at rest, pulse 100-120) AND [2] SAME as normal ? ?Answer Assessment - Initial Assessment Questions ?1. RESPIRATORY STATUS: "Describe your breathing?" (e.g., wheezing, shortness of breath, unable to speak, severe coughing)  ?    Wheezing and sob ?2. ONSET: "When did this breathing problem begin?"  ?    3 days ?3. PATTERN "Does the difficult breathing come and go, or has it been constant since it started?"  ?    constant ?4. SEVERITY: "How bad is your breathing?" (e.g., mild, moderate, severe)  ?  - MILD: No SOB at rest, mild SOB with walking, speaks normally in sentences, can lie down, no retractions, pulse < 100.  ?  - MODERATE: SOB at rest, SOB with minimal exertion and prefers to sit, cannot lie down flat, speaks in phrases, mild retractions, audible wheezing, pulse 100-120.  ?  - SEVERE: Very SOB at rest, speaks in single words, struggling to breathe, sitting hunched forward, retractions, pulse > 120  ?    5 ?5. RECURRENT SYMPTOM: "Have you had difficulty breathing before?" If Yes, ask: "When was the last time?" and "What happened that time?"  ?    yes ?6. CARDIAC HISTORY: "Do you have any history of heart disease?" (e.g., heart attack, angina, bypass surgery, angioplasty)  ?    no ?7. LUNG HISTORY: "Do you have any history of lung disease?"  (e.g., pulmonary embolus, asthma,  emphysema) ?    Copd, asthma ?8. CAUSE: "What do you think is causing the breathing problem?"  ?    Thinks it is bronchitis ?9. OTHER SYMPTOMS: "Do you have any other symptoms? (e.g., dizziness, runny nose, cough, chest pain, fever) ?    no ?10. O2 SATURATION MONITOR:  "Do you use an oxygen saturation monitor (pulse oximeter) at home?" If Yes, "What is your reading (oxygen level) today?" "What is your usual oxygen saturation reading?" (e.g., 95%) ?      no ?11. PREGNANCY: "Is there any chance you are pregnant?" "When was your last menstrual period?" ?      no ?12. TRAVEL: "Have you traveled out of the country in the last month?" (e.g., travel history, exposures) ?      no ? ?Protocols used: Breathing Difficulty-A-AH ? ?

## 2021-09-16 ENCOUNTER — Ambulatory Visit: Payer: Medicare Other | Attending: Family Medicine | Admitting: Family Medicine

## 2021-09-16 ENCOUNTER — Encounter: Payer: Self-pay | Admitting: Family Medicine

## 2021-09-16 DIAGNOSIS — I10 Essential (primary) hypertension: Secondary | ICD-10-CM | POA: Diagnosis not present

## 2021-09-16 DIAGNOSIS — J441 Chronic obstructive pulmonary disease with (acute) exacerbation: Secondary | ICD-10-CM

## 2021-09-16 MED ORDER — CETIRIZINE HCL 10 MG PO TABS: 10.0000 mg | ORAL_TABLET | Freq: Every day | ORAL | 1 refills | Status: DC

## 2021-09-16 MED ORDER — PREDNISONE 20 MG PO TABS: 20.0000 mg | ORAL_TABLET | Freq: Two times a day (BID) | ORAL | 0 refills | Status: DC

## 2021-09-16 MED ORDER — FLUTICASONE PROPIONATE 50 MCG/ACT NA SUSP: 1.0000 | Freq: Every day | NASAL | 2 refills | Status: DC

## 2021-09-16 NOTE — Progress Notes (Signed)
? ?Virtual Visit via Telephone Note ? ?I connected with Maria Oliver, on 09/16/2021 at 9:49 AM by telephone due to the COVID-19 pandemic and verified that I am speaking with the correct person using two identifiers. ?  ?Consent: ?I discussed the limitations, risks, security and privacy concerns of performing an evaluation and management service by telephone and the availability of in person appointments. I also discussed with the patient that there may be a patient responsible charge related to this service. The patient expressed understanding and agreed to proceed. ? ? ?Location of Patient: ?Home ? ?Location of Provider: ?Clinic ? ? ?Persons participating in Telemedicine visit: ?SALOMA CADENA ?Dr. Alvis Lemmings ? ? ? ? ?History of Present Illness: ?Maria Oliver is a 61 y.o. year old female with a history of COPD, hypertension, tobacco abuse (smoking since elementary school), COVID-19 in 02/2021 ?  ? ?Complains of tightness in chest x 4 days and she had been outside , it was hot and there was a lot of pollen.She has been wheezing and coughing with  ?She has a headache everyday, sinus pressure. ?She has no sore throat,, no fever, no myalgia but does have some neck pain.  She is requesting an antibiotic ? ?BP at home was 154/90 this morning.  At her last visit I had increased lisinopril from 10 mg to 20 mg and recommended she follow-up with the clinical pharmacist in 2 weeks to reassess her blood pressure and also to have a potassium check.  This was not scheduled for her at discharge.  She informs me today she received a prescription for HCTZ 25 mg from her [facility] pharmacy along with lisinopril 20 mg.  I did not prescribe HCTZ 25 mg for her recently and review of her chart indicates this was last written in 2018. ?Past Medical History:  ?Diagnosis Date  ? Abdominal pain   ? Anxiety   ? Arthritis   ? Asthma   ? Chronic cholecystitis with calculus s/p lap cholecystectomy 09/09/2016 09/09/2016  ? Complication of  anesthesia   ? slow to arouse post operatively   ? COPD (chronic obstructive pulmonary disease) (HCC)   ? 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 Generalized headaches   ? Hernia, inguinal, right   ? Hypertension   ? Leg swelling   ? ?Allergies  ?Allergen Reactions  ? Aspirin Nausea Only and Other (See Comments)  ?  GI upset/Hernia  ?  ? Ciprofloxacin Nausea Only and Other (See Comments)  ?  Tendonitis, joint pain, and nausea.  "Interfered with her HCTZ" (also)  ? Codeine Nausea And Vomiting and Other (See Comments)  ?  (Also) GI upset/Hernia (CAN TOLERATE DILAUDID & MORPHINE)  ? Eggs Or Egg-Derived Products Nausea Only  ?  Stomach pains  ? Fentanyl Nausea Only and Other (See Comments)  ?  Tolerates Dilaudid or morphine much better  ? Hydrocodone Nausea And Vomiting  ? Lactose Intolerance (Gi) Nausea And Vomiting and Other (See Comments)  ?  (Also) vertigo  ? Oxycodone Nausea And Vomiting  ?  Tolerates hydromorphone better  ? Penicillin G Rash  ? Penicillins Rash  ?  Has patient had a PCN reaction causing immediate rash, facial/tongue/throat swelling, SOB or lightheadedness with hypotension: Yes ?Has patient had a PCN reaction causing severe rash involving mucus membranes or skin necrosis: No ?Has patient had a PCN reaction that required hospitalization No ?Has patient had a PCN reaction occurring within the last 10 years: No ?If  all of the above answers are "NO", then may proceed with Cephalosporin use. ?  ? Tape Rash and Other (See Comments)  ?  Please try paper tape  ? ? ?Current Outpatient Medications on File Prior to Visit  ?Medication Sig Dispense Refill  ? acetaminophen (TYLENOL) 500 MG tablet Take 500-1,000 mg by mouth daily as needed for headache, mild pain or fever.    ? albuterol (VENTOLIN HFA) 108 (90 Base) MCG/ACT inhaler Inhale 2 puffs into the lungs every 6 (six) hours as needed for wheezing or shortness of breath. 8.5 g 0  ? atorvastatin (LIPITOR) 20 MG tablet Take  1 tablet (20 mg total) by mouth daily. 30 tablet 6  ? cyclobenzaprine (FLEXERIL) 10 MG tablet Take 1 tablet (10 mg total) by mouth 2 (two) times daily as needed for muscle spasms. 60 tablet 1  ? fluticasone (FLONASE) 50 MCG/ACT nasal spray Place 1 spray into both nostrils daily. 15.8 mL 2  ? fluticasone-salmeterol (ADVAIR HFA) 115-21 MCG/ACT inhaler Inhale 2 puffs into the lungs 2 (two) times daily. 1 each 12  ? guaiFENesin (MUCINEX) 600 MG 12 hr tablet Take 1 tablet (600 mg total) by mouth 2 (two) times daily. (Patient not taking: Reported on 08/25/2021) 30 tablet 0  ? hydrOXYzine (ATARAX) 25 MG tablet Take 1 tablet (25 mg total) by mouth 3 (three) times daily as needed. 60 tablet 1  ? lactose free nutrition (BOOST) LIQD Take 237 mLs by mouth every other day.    ? lisinopril (ZESTRIL) 20 MG tablet Take 1 tablet (20 mg total) by mouth daily. 90 tablet 3  ? meclizine (ANTIVERT) 12.5 MG tablet Take 2 tablets (25 mg total) by mouth 3 (three) times daily as needed for dizziness or nausea. 30 tablet 0  ? Multiple Vitamin (MULTIVITAMIN WITH MINERALS) TABS tablet Take 1 tablet by mouth daily.    ? naproxen (NAPROSYN) 375 MG tablet Take 1 tablet (375 mg total) by mouth 2 (two) times daily. 20 tablet 0  ? nicotine (NICODERM CQ) 14 mg/24hr patch Place 1 patch (14 mg total) onto the skin daily. For 4 weeks then switch to 7 mg per 24 hours thereafter 28 patch 3  ? ondansetron (ZOFRAN) 4 MG tablet Take 1 tablet (4 mg total) by mouth every 6 (six) hours. 12 tablet 0  ? Vitamin D, Ergocalciferol, (DRISDOL) 1.25 MG (50000 UNIT) CAPS capsule Take 1 capsule (50,000 Units total) by mouth every 7 (seven) days. (Patient not taking: Reported on 08/25/2021) 16 capsule 0  ? ?No current facility-administered medications on file prior to visit.  ? ? ?ROS: ?See HPI ? ?Observations/Objective: ?Awake, alert, oriented x3 ?Not in acute distress ?Normal mood ? ? ? ?  Latest Ref Rng & Units 08/25/2021  ?  2:36 PM 05/06/2021  ?  2:33 PM 04/22/2021  ?   6:15 PM  ?CMP  ?Glucose 70 - 99 mg/dL 69   409108   84    ?BUN 8 - 27 mg/dL 7   12   7     ?Creatinine 0.57 - 1.00 mg/dL 8.110.63   9.140.74   7.820.73    ?Sodium 134 - 144 mmol/L 142   135   138    ?Potassium 3.5 - 5.2 mmol/L 4.4   4.0   4.8    ?Chloride 96 - 106 mmol/L 102   98   104    ?CO2 20 - 29 mmol/L 28   25   26     ?Calcium 8.7 - 10.3  mg/dL 9.5   9.6   8.9    ?Total Protein 6.0 - 8.5 g/dL 6.4   7.2   5.9    ?Total Bilirubin 0.0 - 1.2 mg/dL 0.2   0.6   0.8    ?Alkaline Phos 44 - 121 IU/L 85   67   63    ?AST 0 - 40 IU/L 16   35   31    ?ALT 0 - 32 IU/L 14   33   19    ? ? ?Lipid Panel  ?   ?Component Value Date/Time  ? CHOL 219 (H) 08/25/2021 1436  ? TRIG 105 08/25/2021 1436  ? HDL 55 08/25/2021 1436  ? CHOLHDL 3.5 06/28/2017 1157  ? CHOLHDL 4.7 05/09/2013 1445  ? VLDL 32 05/09/2013 1445  ? LDLCALC 145 (H) 08/25/2021 1436  ? LABVLDL 19 08/25/2021 1436  ? ? ?Lab Results  ?Component Value Date  ? HGBA1C 5.6 12/10/2020  ? ? ?Assessment and Plan: ?1. COPD exacerbation (HCC) ?Likely triggered by the pollen ?Short course of prednisone ?Advised her that antibiotic is not indicated at this time ?If her chest x-ray indicates infectious process then I will send a prescription for antibiotic to her pharmacy ?- predniSONE (DELTASONE) 20 MG tablet; Take 1 tablet (20 mg total) by mouth 2 (two) times daily with a meal.  Dispense: 10 tablet; Refill: 0 ?- fluticasone (FLONASE) 50 MCG/ACT nasal spray; Place 1 spray into both nostrils daily.  Dispense: 15.8 mL; Refill: 2 ?- cetirizine (ZYRTEC) 10 MG tablet; Take 1 tablet (10 mg total) by mouth daily.  Dispense: 30 tablet; Refill: 1 ?- DG Chest 2 View; Future ? ?2. Essential hypertension ?Uncontrolled from home readings ?Lisinopril has been increased from 10 mg to 20 mg at her last visit with me. ?It seems she has an old prescription from [facility] for hydrochlorothiazide which I did not prescribe recently and I have advised her to hold off on this. ?She has been advised to continue with her  lisinopril and come in for a visit with the clinical pharmacist to reassess her blood pressure and check her potassium ?If blood pressure remains above goal then she can be restarted on her diuretic. ? ? ?Foll

## 2021-10-02 NOTE — Progress Notes (Unsigned)
? ?  S:    ?Maria Oliver is a 61 y.o. female who presents for hypertension evaluation, education, and management. PMH is significant for HTN, tobacco abuse. Patient was referred and last seen by Primary Care Provider, Dr. Alvis Lemmings, on 08/25/21 when BP was 178/103 and lisinopril was increased to 20 mg.  ? ?Today, patient arrives in *** spirits and presents {w-w/o:315700} assistance. *** Denies dizziness, headache, blurred vision, swelling.  ?***needs bmet. Hctz in the past. No pcp f/u scheduled yet ? ?Family/Social history:  ?-Current smoker ?-Heart disease and HF in mother ? ?Medication adherence *** . Patient has *** taken BP medications today.  ? ?Current antihypertensives include: lisinopril 20 mg daily ? ?Antihypertensives tried in the past include: HCTZ ? ?Reported home BP readings: *** ? ?Patient reported dietary habits: Eats *** meals/day ?Breakfast: *** ?Lunch: *** ?Dinner: *** ?Snacks: *** ?Drinks: *** ? ?Patient-reported exercise habits: *** ? ?ASCVD risk factors include: HTN, tobacco abuse ? ?O:  ? ?Last 3 Office BP readings: ?BP Readings from Last 3 Encounters:  ?08/25/21 (!) 178/103  ?05/26/21 (!) 155/96  ?05/06/21 139/90  ? ? ?BMET ?   ?Component Value Date/Time  ? NA 142 08/25/2021 1436  ? K 4.4 08/25/2021 1436  ? CL 102 08/25/2021 1436  ? CO2 28 08/25/2021 1436  ? GLUCOSE 69 (L) 08/25/2021 1436  ? GLUCOSE 108 (H) 05/06/2021 1433  ? BUN 7 (L) 08/25/2021 1436  ? CREATININE 0.63 08/25/2021 1436  ? CREATININE 0.67 08/11/2015 1054  ? CALCIUM 9.5 08/25/2021 1436  ? GFRNONAA >60 05/06/2021 1433  ? GFRNONAA >89 08/11/2015 1054  ? GFRAA >60 01/09/2020 2253  ? GFRAA >89 08/11/2015 1054  ? ? ?Renal function: ?CrCl cannot be calculated (Patient's most recent lab result is older than the maximum 21 days allowed.). ? ?Clinical ASCVD: No  ?The 10-year ASCVD risk score (Arnett DK, et al., 2019) is: 22.3% ?  Values used to calculate the score: ?    Age: 24 years ?    Sex: Female ?    Is Non-Hispanic African American:  No ?    Diabetic: No ?    Tobacco smoker: Yes ?    Systolic Blood Pressure: 178 mmHg ?    Is BP treated: Yes ?    HDL Cholesterol: 55 mg/dL ?    Total Cholesterol: 219 mg/dL ? ?A/P: ?Hypertension diagnosed *** currently *** on current medications. BP goal < 130/80 mmHg. Medication adherence appears ***. Control is suboptimal due to ***.  ?-{Meds adjust:18428} ***.  ?-Patient educated on purpose, proper use, and potential adverse effects of ***.  ?-F/u labs ordered - *** ?-Counseled on lifestyle modifications for blood pressure control including reduced dietary sodium, increased exercise, adequate sleep. ?-Encouraged patient to check BP at home and bring log of readings to next visit. Counseled on proper use of home BP cuff.  ? ?Results reviewed and written information provided. Patient verbalized understanding of treatment plan. Total time in face-to-face counseling *** minutes.  ? ?F/u clinic visit in ***. ? ?

## 2021-10-05 ENCOUNTER — Ambulatory Visit: Payer: Medicare Other | Admitting: Pharmacist

## 2021-11-03 ENCOUNTER — Ambulatory Visit: Payer: Medicare Other | Admitting: Pharmacist

## 2021-11-12 ENCOUNTER — Other Ambulatory Visit: Payer: Self-pay | Admitting: *Deleted

## 2021-11-12 DIAGNOSIS — J441 Chronic obstructive pulmonary disease with (acute) exacerbation: Secondary | ICD-10-CM

## 2021-11-12 NOTE — Patient Outreach (Signed)
[facility]) Care Management RN Health Coach Note   11/12/2021 Name:  Maria Oliver MRN:  [id number] DOB:  [birth year]  Summary: Patient stated her breathing has gotten worse.today. She has been under family stress. She stated she may have to go to the ER when she needs a nebulizer treatment. Patient has used her inhaler once today. RN discussed with patient about using her inhalers now and as per ordered. RN discussed talking to her Dr about getting a home nebulizer for treatments instead of going to the ED each time. [name]. RN discussed getting some assist medications to  help stop. Patient stated her food stamp amount has been decreased. A list of food banks was sent to patient as patient requested.   Recommendations/Changes made from today's visit: Take inhaler now as per ordered Discussed with Dr about home nebulizer treatments Monitor blood pressure daily and document Medication adherence A list of food banks will be provided  Subjective: Maria Oliver is an 61 y.o. year old female who is a primary patient of Hoy Register, MD. The care management team was consulted for assistance with care management and/or care coordination needs.    RN Health Coach completed Telephone Visit today.   Objective:  Medications Reviewed Today     Reviewed by Hoy Register, MD (Physician) on 09/16/21 at 1308  Med List Status: <None>   Medication Order Taking? Sig Documenting Provider Last Dose Status Informant  acetaminophen (TYLENOL) 500 MG tablet [id number] No Take 500-1,000 mg by mouth daily as needed for headache, mild pain or fever. [provider] Taking Active Self  albuterol (VENTOLIN HFA) 108 (90 Base) MCG/ACT inhaler [id number] No Inhale 2 puffs into the lungs every 6 (six) hours as needed for wheezing or shortness of breath. Anders Simmonds, PA-C Taking Active Self  atorvastatin (LIPITOR) 20 MG tablet [id number]  Take 1 tablet (20  mg total) by mouth daily. Hoy Register, MD  Active   cetirizine (ZYRTEC) 10 MG tablet [id number] Yes Take 1 tablet (10 mg total) by mouth daily. Hoy Register, MD  Active   cyclobenzaprine (FLEXERIL) 10 MG tablet [id number] No Take 1 tablet (10 mg total) by mouth 2 (two) times daily as needed for muscle spasms. Hoy Register, MD Taking Active   fluticasone (FLONASE) 50 MCG/ACT nasal spray [id number]  Place 1 spray into both nostrils daily. Hoy Register, MD  Active   fluticasone-salmeterol (ADVAIR HFA) 742-59 MCG/ACT inhaler [id number] No Inhale 2 puffs into the lungs 2 (two) times daily. Leroy Sea, MD Taking Active Self  guaiFENesin (MUCINEX) 600 MG 12 hr tablet [id number] No Take 1 tablet (600 mg total) by mouth 2 (two) times daily.  Patient not taking: Reported on 08/25/2021   Valinda Hoar, NP Not Taking Active Self           Med Note Antony Madura, Melody Comas May 06, 2021  4:10 PM)    hydrOXYzine (ATARAX) 25 MG tablet [id number]  Take 1 tablet (25 mg total) by mouth 3 (three) times daily as needed. Hoy Register, MD  Active   lactose free nutrition (BOOST) LIQD [id number] No Take 237 mLs by mouth every other day. [provider] Taking Active Self  lisinopril (ZESTRIL) 20 MG tablet [id number]  Take 1 tablet (20 mg total) by mouth daily. Hoy Register, MD  Active   meclizine (ANTIVERT) 12.5 MG tablet [id number] No Take 2 tablets (25 mg total) by mouth 3 (three) times daily  as needed for dizziness or nausea. Noemi Chapel, NP Taking Active   Multiple Vitamin (MULTIVITAMIN WITH MINERALS) TABS tablet [id number] No Take 1 tablet by mouth daily. [provider] Taking Active Self           Med Note Earlene Plater, Lowanda Foster   Tue Nov 22, 2017 12:56 PM)    naproxen (NAPROSYN) 375 MG tablet [id number] No Take 1 tablet (375 mg total) by mouth 2 (two) times daily. Linwood Dibbles, MD Taking Active Self  nicotine (NICODERM CQ) 14 mg/24hr patch [id number]  Place 1 patch (14 mg total) onto  the skin daily. For 4 weeks then switch to 7 mg per 24 hours thereafter Hoy Register, MD  Active   ondansetron (ZOFRAN) 4 MG tablet [id number] No Take 1 tablet (4 mg total) by mouth every 6 (six) hours. Virgina Norfolk, DO Taking Active   predniSONE (DELTASONE) 20 MG tablet [id number] Yes Take 1 tablet (20 mg total) by mouth 2 (two) times daily with a meal. Newlin, Enobong, MD  Active   Vitamin D, Ergocalciferol, (DRISDOL) 1.25 MG (50000 UNIT) CAPS capsule [id number] No Take 1 capsule (50,000 Units total) by mouth every 7 (seven) days.  Patient not taking: Reported on 08/25/2021   Anders Simmonds, PA-C Not Taking Active Self             SDOH:  (Social Determinants of Health) assessments and interventions performed:    Care Plan  Review of patient past medical history, allergies, medications, health status, including review of consultants reports, laboratory and other test data, was performed as part of comprehensive evaluation for care management services.   Care Plan : RN Care Manager Plan of Care  Updates made by Corda Shutt, Dennard Schaumann, RN since 11/12/2021 12:00 AM     Problem: Knowledge Deficit Related to COPD and Care Coordination   Priority: High     Long-Range Goal: Development Plan of Care for Management of COPD   Start Date: 08/11/2021  Expected End Date: 06/12/2022  Priority: High  Note:   Current Barriers:  Knowledge Deficits related to plan of care for management of HTN and COPD  Financial Constraints   RNCM Clinical Goal(s):  Patient will verbalize understanding of plan for management of HTN and COPD as evidenced by continuation of monitoring for COPD exacerbation symptoms, monitoring blood pressure and medication adherence  through collaboration with RN Care manager, provider, and care team.   Interventions: Inter-disciplinary care team collaboration (see longitudinal plan of care) Evaluation of current treatment plan related to  self management and patient's  adherence to plan as established by provider   COPD Interventions:  (Status:  Goal on track:  NO.) Long Term Goal Provided patient with basic written and verbal COPD education on self care/management/and exacerbation prevention Advised patient to track and manage COPD triggers Provided written and verbal instructions on pursed lip breathing and utilized returned demonstration as teach back Advised patient to self assesses COPD action plan zone and make appointment with provider if in the yellow zone for 48 hours without improvement Advise patient to discuss with the Dr regarding getting a nebulizer instead of going to ER for nebulizer treatments  Patient Goals/Self-Care Activities: Take all medications as prescribed Attend all scheduled provider appointments Call pharmacy for medication refills 3-7 days in advance of running out of medications Perform all self care activities independently  Perform IADL's (shopping, preparing meals, housekeeping, managing finances) independently Call provider office for new concerns or questions  call the Suicide and Crisis  Lifeline: 988 if experiencing a Mental Health or Behavioral Health Crisis  eliminate smoking in my home identify and remove indoor air pollutants limit outdoor activity during cold weather listen for public air quality announcements every day develop a rescue plan eliminate symptom triggers at home follow rescue plan if symptoms flare-up  Follow Up Plan:  Telephone follow up appointment with care management team member scheduled for:  June 2023 The patient has been provided with contact information for the care management team and has been advised to call with any health related questions or concerns.    [id number] Per patient her breathing has gotten worse. She stated that she has been under a lot of stress. Patient  stated she may go to the ER when she needs a nebulizer treatment. Patient had not used her inhaler but once today. RN  discussed with patient about using her inhalers now and talking to her Dr about getting a home nebulizer for treatments instead of going to the ED each time. [name]. RN discussed getting some assist medications to  help stop. Patient stated her food stamp amount has been decreased. A list of food banks was sent to patient as patient requested.      Plan: Telephone follow up appointment with care management team member scheduled for:  February 16, 2022 The patient has been provided with contact information for the care management team and has been advised to call with any health related questions or concerns.   Gean MaidensFrances Dezman Granda BSN RN [facility] Care Management [phone]

## 2021-11-12 NOTE — Patient Instructions (Signed)
Visit Information  Thank you for taking time to visit with me today. Please don't hesitate to contact me if I can be of assistance to you before our next scheduled telephone appointment.  Following are the goals we discussed today:  Current Barriers:  Knowledge Deficits related to plan of care for management of HTN and COPD  Financial Constraints   RNCM Clinical Goal(s):  Patient will verbalize understanding of plan for management of HTN and COPD as evidenced by continuation of monitoring for COPD exacerbation symptoms, monitoring blood pressure and medication adherence through collaboration with RN Care manager, provider, and care team.   Interventions: Inter-disciplinary care team collaboration (see longitudinal plan of care) Evaluation of current treatment plan related to  self management and patient's adherence to plan as established by provider   COPD Interventions:  (Status:  Goal on track:  NO.) Long Term Goal Provided patient with basic written and verbal COPD education on self care/management/and exacerbation prevention Advised patient to track and manage COPD triggers Provided written and verbal instructions on pursed lip breathing and utilized returned demonstration as teach back Advised patient to self assesses COPD action plan zone and make appointment with provider if in the yellow zone for 48 hours without improvement Advise patient to discuss with the Dr regarding getting a nebulizer instead of going to ER for nebulizer treatments  Patient Goals/Self-Care Activities: Take all medications as prescribed Attend all scheduled provider appointments Call pharmacy for medication refills 3-7 days in advance of running out of medications Perform all self care activities independently  Perform IADL's (shopping, preparing meals, housekeeping, managing finances) independently Call provider office for new concerns or questions  call the Suicide and Crisis Lifeline: 988 if  experiencing a Mental Health or Behavioral Health Crisis  eliminate smoking in my home identify and remove indoor air pollutants limit outdoor activity during cold weather listen for public air quality announcements every day develop a rescue plan eliminate symptom triggers at home follow rescue plan if symptoms flare-up  Follow Up Plan:  Telephone follow up appointment with care management team member scheduled for:  June 2023 The patient has been provided with contact information for the care management team and has been advised to call with any health related questions or concerns.    [phone] Per patient her breathing has gotten worse. She stated that she has been under a lot of stress. Patient  stated she may go to the ER when she needs a nebulizer treatment. Patient had not used her inhaler but once today. RN discussed with patient about using her inhalers now and talking to her Dr about getting a home nebulizer for treatments instead of going to the ED each time. [name]. RN discussed getting some assist medications to  help stop. Patient stated her food stamp amount has been decreased. A list of food banks was sent to patient as patient requested.  Our next appointment is by telephone on February 16, 2022  Please call Gean Maidens at [phone] if you need to cancel or reschedule your appointment.   Please call the Suicide and Crisis Lifeline: 988 if you are experiencing a Mental Health or Behavioral Health Crisis or need someone to talk to.  The patient verbalized understanding of instructions, educational materials, and care plan provided today and agreed to receive a mailed copy of patient instructions, educational materials, and care plan.   Telephone follow up appointment with care management team member scheduled for: The patient has been provided  with contact information for the care management team and has been advised to call with any health  related questions or concerns.   SIGNATURE  Gean Maidens BSN RN [facility] Care Management [phone]

## 2021-11-13 NOTE — Addendum Note (Signed)
Addended by: Verlin Grills on: 11/13/2021 02:40 PM   Modules accepted: Orders

## 2021-11-16 ENCOUNTER — Telehealth: Payer: Self-pay

## 2021-11-16 NOTE — Telephone Encounter (Signed)
   Telephone encounter was:  Successful.  11/16/2021 Name: Maria Oliver MRN: [id number] DOB: August 04, 1960  Maria Oliver is a 62 y.o. year old female who is a primary care patient of Charlott Rakes, MD . The community resource team was consulted for assistance with [place] guide performed the following interventions: Patient provided with information about care guide support team and interviewed to confirm resource needs.Patient requested that I email resources for food pantries for [facility]  Follow Up Plan:  Care guide will follow up with patient by phone over the next two weeks    [facility]  [phone] [address], [place], [place] [address] Phone: [phone] Email: [email or web address]

## 2021-12-01 ENCOUNTER — Telehealth: Payer: Self-pay

## 2021-12-01 NOTE — Telephone Encounter (Signed)
   Telephone encounter was:  Successful.  12/01/2021 Name: Maria Oliver MRN: [id number] DOB: [birth year]  Maria Oliver is a 61 y.o. year old female who is a primary care patient of Hoy Register, MD . The community resource team was consulted for assistance with Food Insecurity  Care guide performed the following interventions: Patient provided with information about care guide support team and interviewed to confirm resource needs.Follow up call, [place] and [place] not checked the mail yet to see if resources have been delivered. PT will follow back up with me later  Follow Up Plan:  Care guide will follow up with patient by phone over the next day   [facility] Guide, Embedded Care Coordination [facility], Care Management  [phone] [address] [place], [place], [place] [address] Phone: [phone] Email: [email or web address]

## 2021-12-03 ENCOUNTER — Telehealth: Payer: Self-pay

## 2021-12-03 NOTE — Telephone Encounter (Signed)
   Telephone encounter was:  Successful.  12/03/2021 Name: CHASYA ZENZ MRN: [id number] DOB: [birth year]  TANIELLE EMIGH is a 61 y.o. year old female who is a primary care patient of Hoy Register, MD . The community resource team was consulted for assistance with Financial Difficulties related to financial strain  Care guide performed the following interventions: Patient provided with information about care guide support team and interviewed to confirm resource needs.Patient stated she has not received the mailed resources yet. I am mailing the resources again  Follow Up Plan:  Care guide will follow up with patient by phone over the next two weeks    [facility] Guide, Embedded Care Coordination [facility], Care Management  [phone] [address] [place], [place], [place] [address] Phone: [phone] Email: [email or web address]

## 2021-12-08 ENCOUNTER — Ambulatory Visit: Payer: Medicare Other | Attending: Family Medicine | Admitting: Pharmacist

## 2021-12-08 ENCOUNTER — Encounter: Payer: Self-pay | Admitting: Pharmacist

## 2021-12-08 VITALS — BP 153/88 | HR 73

## 2021-12-08 DIAGNOSIS — J439 Emphysema, unspecified: Secondary | ICD-10-CM

## 2021-12-08 DIAGNOSIS — I10 Essential (primary) hypertension: Secondary | ICD-10-CM

## 2021-12-08 MED ORDER — ALBUTEROL SULFATE HFA 108 (90 BASE) MCG/ACT IN AERS: 2.0000 | INHALATION_SPRAY | Freq: Four times a day (QID) | RESPIRATORY_TRACT | 1 refills | Status: DC | PRN

## 2021-12-09 LAB — CMP14+EGFR
ALT: 14 IU/L (ref 0–32)
AST: 17 IU/L (ref 0–40)
Albumin/Globulin Ratio: 2.3 — ABNORMAL HIGH (ref 1.2–2.2)
Albumin: 4.6 g/dL (ref 3.8–4.8)
Alkaline Phosphatase: 85 IU/L (ref 44–121)
BUN/Creatinine Ratio: 15 (ref 12–28)
BUN: 9 mg/dL (ref 8–27)
Bilirubin Total: <0.2 mg/dL (ref 0.0–1.2)
CO2: 21 mmol/L (ref 20–29)
Calcium: 9.5 mg/dL (ref 8.7–10.3)
Chloride: 105 mmol/L (ref 96–106)
Creatinine, Ser: 0.62 mg/dL (ref 0.57–1.00)
Globulin, Total: 2 g/dL (ref 1.5–4.5)
Glucose: 84 mg/dL (ref 70–99)
Potassium: 4.3 mmol/L (ref 3.5–5.2)
Sodium: 141 mmol/L (ref 134–144)
Total Protein: 6.6 g/dL (ref 6.0–8.5)
eGFR: 100 mL/min/1.73

## 2021-12-18 ENCOUNTER — Telehealth: Payer: Self-pay

## 2021-12-18 NOTE — Telephone Encounter (Signed)
   Telephone encounter was:  Successful.  12/18/2021 Name: KORINA TRETTER MRN: [id number] DOB: September 12, 1960  JUDI JAFFE is a 61 y.o. year old female who is a primary care patient of Hoy Register, MD . The community resource team was consulted for assistance with Food Insecurity  Care guide performed the following interventions: Patient provided with information about care guide support team and interviewed to confirm resource needs.Follow up for mailed resources, the Patient did receive the resources   Follow Up Plan:  No further follow up planned at this time. The patient has been provided with needed resources.    Lenard Forth Care Guide, Embedded Care Coordination [facility], Care Management  [phone] [address] [place], [place], [place] [address] Phone: [phone] Email: [email or web address]

## 2021-12-29 ENCOUNTER — Ambulatory Visit: Payer: Self-pay | Admitting: *Deleted

## 2021-12-29 NOTE — Patient Outreach (Signed)
[facility]) Care Management  12/29/2021  Maria Oliver [birth year] [id number]    [phone]. Patient is still having episodes of coughing especially when goes out side and the humidity is high.  Patient is still smoking. RN Health Coach will close case and refer to Care Coordinator for continued follow up care.  Gean Maidens BSN RN [facility] Care Management [phone]

## 2022-01-15 ENCOUNTER — Ambulatory Visit: Payer: Medicare Other | Attending: Family Medicine | Admitting: Pharmacist

## 2022-01-15 VITALS — BP 147/97

## 2022-01-15 DIAGNOSIS — I10 Essential (primary) hypertension: Secondary | ICD-10-CM

## 2022-01-15 MED ORDER — ROSUVASTATIN CALCIUM 5 MG PO TABS: 5.0000 mg | ORAL_TABLET | Freq: Every day | ORAL | 1 refills | Status: DC

## 2022-01-15 MED ORDER — AMLODIPINE BESYLATE 5 MG PO TABS: 5.0000 mg | ORAL_TABLET | Freq: Every day | ORAL | 0 refills | Status: DC

## 2022-01-15 NOTE — Progress Notes (Signed)
S:     No chief complaint on file.  Maria Oliver is a 61 y.o. female who presents for hypertension evaluation, education, and management. PMH is significant for COPD, hypertension, tobacco abuse (smoking since elementary school), COVID-19 in 02/2021. Patient was referred and last seen by Primary Care Provider, Dr. Alvis Lemmings, on 09/16/2021. I saw her on 12/08/2021. Her BP was above goal, but pt elected to work on lifestyle and declined medication adjustments at that time.   Today, patient arrives in good spirits and presents without assistance. Denies dizziness, headache, blurred vision, swelling. Has dyspnea at baseline due  to COPD.  Patient reports hypertension is longstanding.   Family/Social history:  Fhx: heart disease, heart failure, asthma,  Tobacco: 1 PPD (smoked 1 right before this appt) Alcohol: none  Medication adherence reported. Patient has not taken BP medications today. She takes her lisinopril at bedtime.   Current antihypertensives include: lisinopril 20 mg daily   Antihypertensives tried in the past include: HCTZ  Reported home BP readings:  -None reported since last visit.  Patient reported dietary habits:  -Has been cutting back on sodium. Tries to limit eating out.  -Has cutting back on the salty snacks like potato chips. -Caffeine: admits to drinking 2 cups of coffee in the morning and drinks Mt. Dew during the day.  Patient-reported exercise habits:  -Walks 2 days of the week for about 15 minutes each time.   O:  Vitals:   01/15/22 1119  BP: (!) 147/97    Last 3 Office BP readings: BP Readings from Last 3 Encounters:  01/15/22 (!) 147/97  12/08/21 (!) 153/88  08/25/21 (!) 178/103   BMET    Component Value Date/Time   NA 141 12/08/2021 1035   K 4.3 12/08/2021 1035   CL 105 12/08/2021 1035   CO2 21 12/08/2021 1035   GLUCOSE 84 12/08/2021 1035   GLUCOSE 108 (H) 05/06/2021 1433   BUN 9 12/08/2021 1035   CREATININE 0.62 12/08/2021 1035    CREATININE 0.67 08/11/2015 1054   CALCIUM 9.5 12/08/2021 1035   GFRNONAA >60 05/06/2021 1433   GFRNONAA >89 08/11/2015 1054   GFRAA >60 01/09/2020 2253   GFRAA >89 08/11/2015 1054    Renal function: CrCl cannot be calculated (Patient's most recent lab result is older than the maximum 21 days allowed.).  Clinical ASCVD: No  The 10-year ASCVD risk score (Arnett DK, et al., 2019) is: 15.8%   Values used to calculate the score:     Age: 52 years     Sex: Female     Is Non-Hispanic African American: No     Diabetic: No     Tobacco smoker: Yes     Systolic Blood Pressure: 147 mmHg     Is BP treated: Yes     HDL Cholesterol: 55 mg/dL     Total Cholesterol: 219 mg/dL  Patient is participating in a Managed Medicaid Plan: No   A/P: Hypertension longstanding currently above goal on current medications. BP goal < 130/80 mmHg. Medication adherence appears appropriate. Control is suboptimal due to tobacco use and physical inactivity. She is making improvements in reducing sodium in her diet.  -Continued lisinopril 20 mg daily. -Add amlodipine 5 mg daily at bedtime.  -Patient educated on purpose, proper use, and potential adverse effects of amlodipine. -F/u labs ordered - none -Counseled on lifestyle modifications for blood pressure control including reduced dietary sodium, increased exercise, adequate sleep. -Encouraged patient to check BP at home and bring  log of readings to next visit. Counseled on proper use of home BP cuff.   ASCVD risk: primary prevention in a patient with extensive family hx and elevated ASCVD risk. Her LDL 08/2021 was 145 mg/dL. She is endorsing side effects with atorvastatin. Will change to rosuvastatin 5 mg daily with the goal of intensifying to high intensity strength in the future.  -Stop atorvastatin.  -Start rosuvastatin 5 mg daily at bedtime.    Results reviewed and written information provided.    Written patient instructions provided. Patient verbalized  understanding of treatment plan.  Total time in face to face counseling 20 minutes.    Follow-up:  Pharmacist in 1-1.5 months.  Butch Penny, PharmD, Patsy [place], CPP Clinical Pharmacist [facility] [phone]

## 2022-02-03 ENCOUNTER — Other Ambulatory Visit: Payer: Self-pay

## 2022-02-03 NOTE — Progress Notes (Signed)
Patient appearing on report for [facility] - Hypertension Control report due to last documented ambulatory blood pressure of 147/97 mmHg on 01/15/2022. Next appointment with PCP is 02/08/2022.   Outreached patient to discuss hypertension control and medication management. Left voicemail for patient to return my call at their convenience.   Valeda Malm, Pharm.D. PGY-2 Ambulatory Care Pharmacy Resident 02/03/2022 11:24 AM

## 2022-02-08 ENCOUNTER — Encounter: Payer: Self-pay | Admitting: Family Medicine

## 2022-02-08 ENCOUNTER — Ambulatory Visit: Payer: Medicare Other | Attending: Family Medicine | Admitting: Family Medicine

## 2022-02-08 VITALS — BP 148/87 | HR 68 | Temp 98.2°F | Ht 64.0 in | Wt 146.2 lb

## 2022-02-08 DIAGNOSIS — R1013 Epigastric pain: Secondary | ICD-10-CM

## 2022-02-08 DIAGNOSIS — J439 Emphysema, unspecified: Secondary | ICD-10-CM | POA: Diagnosis not present

## 2022-02-08 DIAGNOSIS — I10 Essential (primary) hypertension: Secondary | ICD-10-CM

## 2022-02-08 NOTE — Patient Instructions (Signed)

## 2022-02-08 NOTE — Progress Notes (Signed)
Subjective:  Patient ID: Maria Oliver, female    DOB: 1960-07-03  Age: 61 y.o. MRN: [id number]  CC: Hypertension   HPI Maria Oliver is a 61 y.o. year old female with a history of COPD, hypertension, tobacco abuse (smoking since elementary school).  Interval History:  She was in a rush to get here and attributes that to her initially elevated blood pressure.  Of 167/72 on repeat return to 148/87. At her visit with the clinical pharmacist 3 weeks ago amlodipine was initiated which she endorses adherence with.  She is smoking 10-12 Cigarettes /day and declines initiation of pharmacotherapy.  States she is working on quitting on her own. COPD has been stable and she is using her inhaler as prescribed.   She does have intermittent epigastric pain which is absent at the moment but not associated to any particular foods.  Describes this as sometimes burning.  Denies presence of reflux, nausea or vomiting.  She notices that when pain occurs she also has lower back pain and has to lie down for relief and resolution of symptoms.  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 09/09/2016   Generalized headaches    Hernia, inguinal, right    Hypertension    Leg swelling     Past Surgical History:  Procedure Laterality Date   COLON SURGERY  2010   per patient "colon take down"   COLOSTOMY  2010   LAPAROSCOPIC CHOLECYSTECTOMY SINGLE SITE WITH INTRAOPERATIVE CHOLANGIOGRAM N/A 09/09/2016   Procedure: LAPAROSCOPIC CHOLECYSTECTOMY  WITH INTRAOPERATIVE CHOLANGIOGRAM;  Surgeon: Karie Soda, MD;  Location: [facility];  Service: General;   Laterality: N/A;   TUBAL LIGATION      Family History  Problem Relation Age of Onset   Heart disease Mother 75   Heart failure Mother    Asthma Mother    Emphysema Mother    Alzheimer's disease Father 95   Emphysema Brother    Asthma Brother    Colon cancer Neg Hx     Social History   Socioeconomic History   Marital status: Widowed    Spouse name: Not on file   Number of children: 3   Years of education: Not on file   Highest education level: Not on file  Occupational History   Occupation: disabled  Tobacco Use   Smoking status: Every Day    Packs/day: 1.00    Years: 47.00    Total pack years: 47.00    Types: Cigarettes   Smokeless tobacco: Never   Tobacco comments:    1/2 ppd 02/25/20   Vaping Use   Vaping Use: Never used  Substance and Sexual Activity   Alcohol use: No    Alcohol/week: 0.0 standard drinks of alcohol   Drug use: No   Sexual activity: Not Currently    Birth control/protection: Surgical, None  Other Topics Concern   Not on file  Social History Narrative   Not on file   Social Determinants of Health   Financial Resource Strain: High Risk (11/16/2021)   Overall Financial Resource Strain (CARDIA)    Difficulty of Paying Living  Expenses: Very hard  Food Insecurity: Food Insecurity Present (11/16/2021)   Hunger Vital Sign    Worried About Running Out of Food in the Last Year: Sometimes true    Ran Out of Food in the Last Year: Sometimes true  Transportation Needs: No Transportation Needs (03/08/2021)   PRAPARE - Administrator, Civil Service (Medical): No    Lack of Transportation (Non-Medical): No  Physical Activity: Insufficiently Active (03/08/2021)   Exercise Vital Sign    Days of Exercise per Week: 5 days    Minutes of Exercise per Session: 20 min  Stress: Not on file  Social Connections: Unknown (03/08/2021)   Social Connection and Isolation Panel [NHANES]    Frequency of Communication with Friends and Family: Three times a week     Frequency of Social Gatherings with Friends and Family: Three times a week    Attends Religious Services: Never    Active Member of Clubs or Organizations: No    Attends Banker Meetings: Never    Marital Status: Not on file    Allergies  Allergen Reactions   Aspirin Nausea Only and Other (See Comments)    GI upset/Hernia     Ciprofloxacin Nausea Only and Other (See Comments)    Tendonitis, joint pain, and nausea.  "Interfered with her HCTZ" (also)   Codeine Nausea And Vomiting and Other (See Comments)    (Also) GI upset/Hernia (CAN TOLERATE DILAUDID & MORPHINE)   Eggs Or Egg-Derived Products Nausea Only    Stomach pains   Fentanyl Nausea Only and Other (See Comments)    Tolerates Dilaudid or morphine much better   Hydrocodone Nausea And Vomiting   Lactose Intolerance (Gi) Nausea And Vomiting and Other (See Comments)    (Also) vertigo   Oxycodone Nausea And Vomiting    Tolerates hydromorphone better   Penicillin G Rash   Penicillins Rash    Has patient had a PCN reaction causing immediate rash, facial/tongue/throat swelling, SOB or lightheadedness with hypotension: Yes Has patient had a PCN reaction causing severe rash involving mucus membranes or skin necrosis: No Has patient had a PCN reaction that required hospitalization No Has patient had a PCN reaction occurring within the last 10 years: No If all of the above answers are "NO", then may proceed with Cephalosporin use.    Tape Rash and Other (See Comments)    Please try paper tape    Outpatient Medications Prior to Visit  Medication Sig Dispense Refill   acetaminophen (TYLENOL) 500 MG tablet Take 500-1,000 mg by mouth daily as needed for headache, mild pain or fever.     albuterol (VENTOLIN HFA) 108 (90 Base) MCG/ACT inhaler Inhale 2 puffs into the lungs every 6 (six) hours as needed for wheezing or shortness of breath. 25.5 g 1   amLODipine (NORVASC) 5 MG tablet Take 1 tablet (5 mg total) by mouth  daily. 90 tablet 0   fluticasone (FLONASE) 50 MCG/ACT nasal spray Place 1 spray into both nostrils daily. 15.8 mL 2   fluticasone-salmeterol (ADVAIR HFA) 115-21 MCG/ACT inhaler Inhale 2 puffs into the lungs 2 (two) times daily. 1 each 12   lactose free nutrition (BOOST) LIQD Take 237 mLs by mouth every other day.     lisinopril (ZESTRIL) 20 MG tablet Take 1 tablet (20 mg total) by mouth daily. 90 tablet 3   Multiple Vitamin (MULTIVITAMIN WITH MINERALS) TABS tablet Take 1 tablet by mouth daily.     ondansetron (ZOFRAN) 4 MG  tablet Take 1 tablet (4 mg total) by mouth every 6 (six) hours. 12 tablet 0   rosuvastatin (CRESTOR) 5 MG tablet Take 1 tablet (5 mg total) by mouth daily. 90 tablet 1   cetirizine (ZYRTEC) 10 MG tablet Take 1 tablet (10 mg total) by mouth daily. (Patient not taking: Reported on 02/08/2022) 30 tablet 1   nicotine (NICODERM CQ) 14 mg/24hr patch Place 1 patch (14 mg total) onto the skin daily. For 4 weeks then switch to 7 mg per 24 hours thereafter (Patient not taking: Reported on 02/08/2022) 28 patch 3   cyclobenzaprine (FLEXERIL) 10 MG tablet Take 1 tablet (10 mg total) by mouth 2 (two) times daily as needed for muscle spasms. (Patient not taking: Reported on 02/08/2022) 60 tablet 1   guaiFENesin (MUCINEX) 600 MG 12 hr tablet Take 1 tablet (600 mg total) by mouth 2 (two) times daily. (Patient not taking: Reported on 08/25/2021) 30 tablet 0   hydrOXYzine (ATARAX) 25 MG tablet Take 1 tablet (25 mg total) by mouth 3 (three) times daily as needed. (Patient not taking: Reported on 02/08/2022) 60 tablet 1   meclizine (ANTIVERT) 12.5 MG tablet Take 2 tablets (25 mg total) by mouth 3 (three) times daily as needed for dizziness or nausea. (Patient not taking: Reported on 02/08/2022) 30 tablet 0   naproxen (NAPROSYN) 375 MG tablet Take 1 tablet (375 mg total) by mouth 2 (two) times daily. (Patient not taking: Reported on 02/08/2022) 20 tablet 0   predniSONE (DELTASONE) 20 MG tablet Take 1 tablet (20  mg total) by mouth 2 (two) times daily with a meal. (Patient not taking: Reported on 02/08/2022) 10 tablet 0   Vitamin D, Ergocalciferol, (DRISDOL) 1.25 MG (50000 UNIT) CAPS capsule Take 1 capsule (50,000 Units total) by mouth every 7 (seven) days. (Patient not taking: Reported on 08/25/2021) 16 capsule 0   No facility-administered medications prior to visit.     ROS Review of Systems  Constitutional:  Negative for activity change and appetite change.  HENT:  Negative for sinus pressure and sore throat.   Respiratory:  Negative for chest tightness, shortness of breath and wheezing.   Cardiovascular:  Negative for chest pain and palpitations.  Gastrointestinal:  Positive for abdominal pain. Negative for abdominal distention and constipation.  Genitourinary: Negative.   Musculoskeletal: Negative.   Psychiatric/Behavioral:  Negative for behavioral problems and dysphoric mood.     Objective:  BP (!) 148/87   Pulse 68   Temp 98.2 F (36.8 C) (Oral)   Ht 5\' 4"  (1.626 m)   Wt 146 lb 3.2 oz (66.3 kg)   SpO2 97%   BMI 25.10 kg/m      02/08/2022   12:11 PM 02/08/2022   11:35 AM 01/15/2022   11:19 AM  BP/Weight  Systolic BP [id number]  Diastolic BP 87 72 97  Wt. (Lbs)  146.2   BMI  25.1 kg/m2       Physical Exam Constitutional:      Appearance: She is well-developed.  Cardiovascular:     Rate and Rhythm: Normal rate.     Heart sounds: Normal heart sounds. No murmur heard. Pulmonary:     Effort: Pulmonary effort is normal.     Breath sounds: Normal breath sounds. No wheezing or rales.  Chest:     Chest wall: No tenderness.  Abdominal:     General: Bowel sounds are normal. There is no distension.     Palpations: Abdomen is soft. There is no  mass.     Tenderness: There is no abdominal tenderness.  Musculoskeletal:        General: Normal range of motion.     Right lower leg: No edema.     Left lower leg: No edema.  Neurological:     Mental Status: She is alert and oriented  to person, place, and time.  Psychiatric:        Mood and Affect: Mood normal.        Latest Ref Rng & Units 12/08/2021   10:35 AM 08/25/2021    2:36 PM 05/06/2021    2:33 PM  CMP  Glucose 70 - 99 mg/dL 84  69  108   BUN 8 - 27 mg/dL 9  7  12    Creatinine 0.57 - 1.00 mg/dL 0.62  0.63  0.74   Sodium 134 - 144 mmol/L 141  142  135   Potassium 3.5 - 5.2 mmol/L 4.3  4.4  4.0   Chloride 96 - 106 mmol/L 105  102  98   CO2 20 - 29 mmol/L 21  28  25    Calcium 8.7 - 10.3 mg/dL 9.5  9.5  9.6   Total Protein 6.0 - 8.5 g/dL 6.6  6.4  7.2   Total Bilirubin 0.0 - 1.2 mg/dL <0.2  0.2  0.6   Alkaline Phos 44 - 121 IU/L 85  85  67   AST 0 - 40 IU/L 17  16  35   ALT 0 - 32 IU/L 14  14  33     Lipid Panel     Component Value Date/Time   CHOL 219 (H) 08/25/2021 1436   TRIG 105 08/25/2021 1436   HDL 55 08/25/2021 1436   CHOLHDL 3.5 06/28/2017 1157   CHOLHDL 4.7 05/09/2013 1445   VLDL 32 05/09/2013 1445   LDLCALC 145 (H) 08/25/2021 1436    CBC    Component Value Date/Time   WBC 6.1 05/06/2021 1450   RBC 5.81 (H) 05/06/2021 1450   HGB 16.4 (H) 05/06/2021 1450   HGB 14.3 11/02/2017 1621   HCT 50.8 (H) 05/06/2021 1450   HCT 44.0 11/02/2017 1621   PLT 265 05/06/2021 1450   PLT 375 11/02/2017 1621   MCV 87.4 05/06/2021 1450   MCV 87 11/02/2017 1621   MCH 28.2 05/06/2021 1450   MCHC 32.3 05/06/2021 1450   RDW 14.1 05/06/2021 1450   RDW 14.6 11/02/2017 1621   LYMPHSABS 2.9 04/22/2021 1523   LYMPHSABS 2.5 11/02/2017 1621   MONOABS 0.6 04/22/2021 1523   EOSABS 0.3 04/22/2021 1523   EOSABS 0.3 11/02/2017 1621   BASOSABS 0.1 04/22/2021 1523   BASOSABS 0.0 11/02/2017 1621    Lab Results  Component Value Date   HGBA1C 5.6 12/10/2020    Assessment & Plan:  1. Essential hypertension Slightly above goal I Would like to increase amlodipine from 5 mg to 10 mg but she would like to hold off at this time Repeat blood pressure at clinical pharmacy visit and adjust regimen accordingly if  still above goal Counseled on blood pressure goal of less than 130/80, low-sodium, DASH diet, medication compliance, 150 minutes of moderate intensity exercise per week. Discussed medication compliance, adverse effects.  2. Pulmonary emphysema, unspecified emphysema type (HCC) No exacerbations Smoking cessation will be beneficial Continue inhalers  3. Epigastric pain Suspicious for gastritis Advised to use OTC PPI if this persist   Health Care Maintenance: She declines colonoscopy due to previous bad experience in which  she developed complications postprocedure.  Cologuard ordered but she is yet to follow through with this and has been advised to mail this in. No orders of the defined types were placed in this encounter.   Follow-up: Return in about 3 months (around 05/11/2022) for Medicare wellness exam.       Charlott Rakes, MD, FAAFP. [facility] and [place] [place] [facility], [place]   02/08/2022, 12:28 PM

## 2022-02-16 ENCOUNTER — Ambulatory Visit: Payer: Medicare Other | Admitting: *Deleted

## 2022-02-23 ENCOUNTER — Ambulatory Visit: Payer: Medicare Other | Admitting: Pharmacist

## 2022-03-12 ENCOUNTER — Other Ambulatory Visit: Payer: Self-pay | Admitting: *Deleted

## 2022-03-12 DIAGNOSIS — Z122 Encounter for screening for malignant neoplasm of respiratory organs: Secondary | ICD-10-CM

## 2022-03-12 DIAGNOSIS — Z87891 Personal history of nicotine dependence: Secondary | ICD-10-CM

## 2022-03-12 DIAGNOSIS — F17210 Nicotine dependence, cigarettes, uncomplicated: Secondary | ICD-10-CM

## 2022-03-22 ENCOUNTER — Ambulatory Visit: Payer: Self-pay

## 2022-03-22 NOTE — Telephone Encounter (Signed)
Scheduled appointment for patient 03/23/2022 at 11:10

## 2022-03-22 NOTE — Telephone Encounter (Signed)
  Chief Complaint: UTI Symptoms: dysuria, lower back pain and hip pain, urine odor Frequency: 03/19/22 Pertinent Negatives: NA Disposition: [] ED /[] Urgent Care (no appt availability in office) / [] Appointment(In office/virtual)/ []  [facility]/ [] Home Care/ [x] Refused Recommended Disposition /[] [facility]/ [x]  Follow-up with PCP Additional Notes: pt requesting to come in to do UA and blood work, advised no appts available. Sent Teams message to [place] but no response. Recommended pt go to UC and be seen but pt refused. She is wanting to come in to office to do labs and then if needed to see provider will do that. Advised I would send message back to provider and they can give CB with update. Pt verbalized understanding.   Summary: urinary discomfort   The patient has called regarding their urinary discomfort   The patient first began to experience hip and lower back discomfort on 03/19/22   The patient has noticed a strong smell with their urine as well   Please contact further when possible      Reason for Disposition  Side (flank) or lower back pain present  Answer Assessment - Initial Assessment Questions 1. SYMPTOM: "What's the main symptom you're concerned about?" (e.g., frequency, incontinence)     dysuria 2. ONSET: "When did the  sx  start?"     03/19/22 3. PAIN: "Is there any pain?" If Yes, ask: "How bad is it?" (Scale: 1-10; mild, moderate, severe)     Moderate  4. CAUSE: "What do you think is causing the symptoms?"     UTI 5. OTHER SYMPTOMS: "Do you have any other symptoms?" (e.g., blood in urine, fever, flank pain, pain with urination)     Hip and lower back pain, odor  Protocols used: Urinary Symptoms-A-AH

## 2022-03-23 ENCOUNTER — Ambulatory Visit: Payer: Medicare Other | Attending: Family Medicine | Admitting: Family Medicine

## 2022-03-23 ENCOUNTER — Encounter: Payer: Self-pay | Admitting: Family Medicine

## 2022-03-23 VITALS — BP 146/82 | HR 80 | Ht 64.0 in | Wt 150.2 lb

## 2022-03-23 DIAGNOSIS — J439 Emphysema, unspecified: Secondary | ICD-10-CM

## 2022-03-23 DIAGNOSIS — B349 Viral infection, unspecified: Secondary | ICD-10-CM | POA: Diagnosis not present

## 2022-03-23 DIAGNOSIS — R399 Unspecified symptoms and signs involving the genitourinary system: Secondary | ICD-10-CM

## 2022-03-23 DIAGNOSIS — M549 Dorsalgia, unspecified: Secondary | ICD-10-CM

## 2022-03-23 LAB — POCT URINALYSIS DIP (CLINITEK)
Bilirubin, UA: NEGATIVE
Blood, UA: NEGATIVE
Glucose, UA: NEGATIVE mg/dL
Ketones, POC UA: NEGATIVE mg/dL
Nitrite, UA: NEGATIVE
POC PROTEIN,UA: NEGATIVE
Spec Grav, UA: 1.01
Urobilinogen, UA: 0.2 U/dL
pH, UA: 6

## 2022-03-23 LAB — POCT INFLUENZA A/B
Influenza A, POC: NEGATIVE
Influenza B, POC: NEGATIVE

## 2022-03-23 MED ORDER — TIZANIDINE HCL 4 MG PO TABS: 4.0000 mg | ORAL_TABLET | Freq: Three times a day (TID) | ORAL | 0 refills | Status: DC | PRN

## 2022-03-23 MED ORDER — TRELEGY ELLIPTA 100-62.5-25 MCG/ACT IN AEPB: 1.0000 | INHALATION_SPRAY | Freq: Every day | RESPIRATORY_TRACT | 3 refills | Status: DC

## 2022-03-23 NOTE — Patient Instructions (Addendum)
Muscle Cramps and Spasms Muscle cramps and spasms are when muscles tighten by themselves. They usually get better within minutes. Muscle cramps are painful. They are usually stronger and last longer than muscle spasms. Muscle spasms may or may not be painful. They can last a few seconds or much longer. Cramps and spasms can affect any muscle, but they occur most often in the calf muscles of the leg. They are usually not caused by a serious problem. In many cases, the cause is not known. Some common causes include: Doing more physical work or exercise than your body is ready for. Using the muscles too much (overuse) by repeating certain movements too many times. Staying in a certain position for a long time. Playing a sport or doing an activity without preparing properly. Using bad form or technique while playing a sport or doing an activity. Not having enough water in your body (dehydration). Injury. Side effects of some medicines. Low levels of the salts and minerals in your blood (electrolytes), such as low potassium or calcium. Follow these instructions at home: Managing pain and stiffness     Massage, stretch, and relax the muscle. Do this for many minutes at a time. If told, put heat on tight or tense muscles as often as told by your doctor. Use the heat source that your doctor recommends, such as a moist heat pack or a heating pad. Place a towel between your skin and the heat source. Leave the heat on for 20-30 minutes. Remove the heat if your skin turns bright red. This is very important if you are not able to feel pain, heat, or cold. You may have a greater risk of getting burned. If told, put ice on the affected area. This may help if you are sore or have pain after a cramp or spasm. Put ice in a plastic bag. Place a towel between your skin and the bag. Leave the ice on for 20 minutes, 2-3 times a day. Try taking hot showers or baths to help relax tight muscles. Eating and  drinking Drink enough fluid to keep your pee (urine) pale yellow. Eat a healthy diet to help ensure that your muscles work well. This should include: Fruits and vegetables. Lean protein. Whole grains. Low-fat or nonfat dairy products. General instructions If you are having cramps often, avoid intense exercise for several days. Take over-the-counter and prescription medicines only as told by your doctor. Watch for any changes in your symptoms. Keep all follow-up visits as told by your doctor. This is important. Contact a doctor if: Your cramps or spasms get worse or happen more often. Your cramps or spasms do not get better with time. Summary Muscle cramps and spasms are when muscles tighten by themselves. They usually get better within minutes. Cramps and spasms occur most often in the calf muscles of the leg. Massage, stretch, and relax the muscle. This may help the cramp or spasm go away. Drink enough fluid to keep your pee (urine) pale yellow. This information is not intended to replace advice given to you by your health care provider. Make sure you discuss any questions you have with your health care provider. Document Revised: 12/19/2020 Document Reviewed: 12/19/2020 Elsevier Patient Education  2023 [employer]  

## 2022-03-23 NOTE — Progress Notes (Signed)
Subjective:  Patient ID: Maria Oliver, female    DOB: [birth year]  Age: 61 y.o. MRN: [id number]  CC: Urinary Tract Infection   HPI Maria Oliver is a 61 y.o. year old female with a history of COPD, hypertension, tobacco abuse (smoking since elementary school).  Interval History:  She complains of low back pain, dyspnea and fatigue For the last 4 days she has had chest wall soreness, dyspnea, myalgia , no fever, no loss off appetite, no chills. Grandson did have a 'respiratory ilness' but did not seek medical care.  She denies presence of upper respiratory symptoms or GI symptoms. Currently under the care of [facility] for management of emphysema and has been on Ventolin HFA but not adherent with Advair.  She has not seen Pulmonary in a while.  States left sided low back pain radiates anteriorly to the left side of her abdomen and she thinks it might be a UTI and symptoms have been present x4 days. She had similar symptoms a few months ago when she went to the beach and had done a lot of walking. She does not have dysuria, hematuria, but does have a little bit of frequency.  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 09/09/2016   Generalized headaches    Hernia, inguinal, right    Hypertension    Leg swelling     Past Surgical History:  Procedure Laterality Date   COLON SURGERY  2010   per patient "colon take down"   COLOSTOMY  2010   LAPAROSCOPIC CHOLECYSTECTOMY SINGLE SITE WITH INTRAOPERATIVE CHOLANGIOGRAM N/A 09/09/2016   Procedure: LAPAROSCOPIC CHOLECYSTECTOMY  WITH INTRAOPERATIVE CHOLANGIOGRAM;  Surgeon: Karie Soda, MD;  Location: [facility];  Service:  General;  Laterality: N/A;   TUBAL LIGATION      Family History  Problem Relation Age of Onset   Heart disease Mother 23   Heart failure Mother    Asthma Mother    Emphysema Mother    Alzheimer's disease Father 58   Emphysema Brother    Asthma Brother    Colon cancer Neg Hx     Social History   Socioeconomic History   Marital status: Widowed    Spouse name: Not on file   Number of children: 3   Years of education: Not on file   Highest education level: Not on file  Occupational History   Occupation: disabled  Tobacco Use   Smoking status: Every Day    Packs/day: 1.00    Years: 47.00    Total pack years: 47.00    Types: Cigarettes   Smokeless tobacco: Never   Tobacco comments:    1/2 ppd 02/25/20   Vaping Use   Vaping Use: Never used  Substance and Sexual Activity   Alcohol use: No    Alcohol/week: 0.0 standard drinks of alcohol   Drug use: No   Sexual activity: Not Currently    Birth control/protection: Surgical, None  Other Topics Concern   Not on file  Social History Narrative   Not on file   Social Determinants of Health   Financial Resource Strain: High Risk (11/16/2021)   Overall Financial  Resource Strain (CARDIA)    Difficulty of Paying Living Expenses: Very hard  Food Insecurity: Food Insecurity Present (11/16/2021)   Hunger Vital Sign    Worried About Running Out of Food in the Last Year: Sometimes true    Ran Out of Food in the Last Year: Sometimes true  Transportation Needs: No Transportation Needs (03/08/2021)   PRAPARE - Administrator, Civil Service (Medical): No    Lack of Transportation (Non-Medical): No  Physical Activity: Insufficiently Active (03/08/2021)   Exercise Vital Sign    Days of Exercise per Week: 5 days    Minutes of Exercise per Session: 20 min  Stress: Not on file  Social Connections: Unknown (03/08/2021)   Social Connection and Isolation Panel [NHANES]    Frequency of Communication with Friends and Family: Three  times a week    Frequency of Social Gatherings with Friends and Family: Three times a week    Attends Religious Services: Never    Active Member of Clubs or Organizations: No    Attends Banker Meetings: Never    Marital Status: Not on file    Allergies  Allergen Reactions   Aspirin Nausea Only and Other (See Comments)    GI upset/Hernia     Ciprofloxacin Nausea Only and Other (See Comments)    Tendonitis, joint pain, and nausea.  "Interfered with her HCTZ" (also)   Codeine Nausea And Vomiting and Other (See Comments)    (Also) GI upset/Hernia (CAN TOLERATE DILAUDID & MORPHINE)   Eggs Or Egg-Derived Products Nausea Only    Stomach pains   Fentanyl Nausea Only and Other (See Comments)    Tolerates Dilaudid or morphine much better   Hydrocodone Nausea And Vomiting   Lactose Intolerance (Gi) Nausea And Vomiting and Other (See Comments)    (Also) vertigo   Oxycodone Nausea And Vomiting    Tolerates hydromorphone better   Penicillin G Rash   Penicillins Rash    Has patient had a PCN reaction causing immediate rash, facial/tongue/throat swelling, SOB or lightheadedness with hypotension: Yes Has patient had a PCN reaction causing severe rash involving mucus membranes or skin necrosis: No Has patient had a PCN reaction that required hospitalization No Has patient had a PCN reaction occurring within the last 10 years: No If all of the above answers are "NO", then may proceed with Cephalosporin use.    Tape Rash and Other (See Comments)    Please try paper tape    Outpatient Medications Prior to Visit  Medication Sig Dispense Refill   acetaminophen (TYLENOL) 500 MG tablet Take 500-1,000 mg by mouth daily as needed for headache, mild pain or fever.     albuterol (VENTOLIN HFA) 108 (90 Base) MCG/ACT inhaler Inhale 2 puffs into the lungs every 6 (six) hours as needed for wheezing or shortness of breath. 25.5 g 1   amLODipine (NORVASC) 5 MG tablet Take 1 tablet (5 mg total)  by mouth daily. 90 tablet 0   cetirizine (ZYRTEC) 10 MG tablet Take 1 tablet (10 mg total) by mouth daily. 30 tablet 1   fluticasone (FLONASE) 50 MCG/ACT nasal spray Place 1 spray into both nostrils daily. 15.8 mL 2   lactose free nutrition (BOOST) LIQD Take 237 mLs by mouth every other day.     lisinopril (ZESTRIL) 20 MG tablet Take 1 tablet (20 mg total) by mouth daily. 90 tablet 3   Multiple Vitamin (MULTIVITAMIN WITH MINERALS) TABS tablet Take 1 tablet by mouth daily.  nicotine (NICODERM CQ) 14 mg/24hr patch Place 1 patch (14 mg total) onto the skin daily. For 4 weeks then switch to 7 mg per 24 hours thereafter 28 patch 3   ondansetron (ZOFRAN) 4 MG tablet Take 1 tablet (4 mg total) by mouth every 6 (six) hours. 12 tablet 0   rosuvastatin (CRESTOR) 5 MG tablet Take 1 tablet (5 mg total) by mouth daily. 90 tablet 1   fluticasone-salmeterol (ADVAIR HFA) 115-21 MCG/ACT inhaler Inhale 2 puffs into the lungs 2 (two) times daily. 1 each 12   No facility-administered medications prior to visit.     ROS Review of Systems  Constitutional:  Positive for fatigue. Negative for activity change and appetite change.  HENT:  Negative for sinus pressure and sore throat.   Respiratory:  Positive for shortness of breath. Negative for chest tightness and wheezing.   Cardiovascular:  Negative for chest pain and palpitations.  Gastrointestinal:  Negative for abdominal distention, abdominal pain and constipation.  Genitourinary: Negative.   Musculoskeletal:  Positive for back pain.  Psychiatric/Behavioral:  Negative for behavioral problems and dysphoric mood.     Objective:  BP (!) 146/82   Pulse 80   Ht 5\' 4"  (1.626 m)   Wt 150 lb 3.2 oz (68.1 kg)   SpO2 97%   BMI 25.78 kg/m      03/23/2022   11:47 AM 02/08/2022   12:11 PM 02/08/2022   11:35 AM  BP/Weight  Systolic BP 937 169 678  Diastolic BP 82 87 72  Wt. (Lbs) 150.2  146.2  BMI 25.78 kg/m2  25.1 kg/m2      Physical  Exam Constitutional:      Appearance: She is well-developed.  Cardiovascular:     Rate and Rhythm: Normal rate.     Heart sounds: Normal heart sounds. No murmur heard. Pulmonary:     Effort: Pulmonary effort is normal.     Breath sounds: Normal breath sounds. No wheezing or rales.  Chest:     Chest wall: No tenderness.  Abdominal:     General: Bowel sounds are normal. There is no distension.     Palpations: Abdomen is soft. There is no mass.     Tenderness: There is no abdominal tenderness. There is left CVA tenderness. There is no right CVA tenderness.     Hernia: A hernia is present.  Musculoskeletal:        General: Normal range of motion.     Right lower leg: No edema.     Left lower leg: No edema.  Neurological:     Mental Status: She is alert and oriented to person, place, and time.  Psychiatric:        Mood and Affect: Mood normal.        Latest Ref Rng & Units 12/08/2021   10:35 AM 08/25/2021    2:36 PM 05/06/2021    2:33 PM  CMP  Glucose 70 - 99 mg/dL 84  69  108   BUN 8 - 27 mg/dL 9  7  12    Creatinine 0.57 - 1.00 mg/dL 0.62  0.63  0.74   Sodium 134 - 144 mmol/L 141  142  135   Potassium 3.5 - 5.2 mmol/L 4.3  4.4  4.0   Chloride 96 - 106 mmol/L 105  102  98   CO2 20 - 29 mmol/L 21  28  25    Calcium 8.7 - 10.3 mg/dL 9.5  9.5  9.6   Total Protein 6.0 -  8.5 g/dL 6.6  6.4  7.2   Total Bilirubin 0.0 - 1.2 mg/dL <3.0  0.2  0.6   Alkaline Phos 44 - 121 IU/L 85  85  67   AST 0 - 40 IU/L 17  16  35   ALT 0 - 32 IU/L 14  14  33     Lipid Panel     Component Value Date/Time   CHOL 219 (H) 08/25/2021 1436   TRIG 105 08/25/2021 1436   HDL 55 08/25/2021 1436   CHOLHDL 3.5 06/28/2017 1157   CHOLHDL 4.7 05/09/2013 1445   VLDL 32 05/09/2013 1445   LDLCALC 145 (H) 08/25/2021 1436    CBC    Component Value Date/Time   WBC 6.1 05/06/2021 1450   RBC 5.81 (H) 05/06/2021 1450   HGB 16.4 (H) 05/06/2021 1450   HGB 14.3 11/02/2017 1621   HCT 50.8 (H) 05/06/2021 1450    HCT 44.0 11/02/2017 1621   PLT 265 05/06/2021 1450   PLT 375 11/02/2017 1621   MCV 87.4 05/06/2021 1450   MCV 87 11/02/2017 1621   MCH 28.2 05/06/2021 1450   MCHC 32.3 05/06/2021 1450   RDW 14.1 05/06/2021 1450   RDW 14.6 11/02/2017 1621   LYMPHSABS 2.9 04/22/2021 1523   LYMPHSABS 2.5 11/02/2017 1621   MONOABS 0.6 04/22/2021 1523   EOSABS 0.3 04/22/2021 1523   EOSABS 0.3 11/02/2017 1621   BASOSABS 0.1 04/22/2021 1523   BASOSABS 0.0 11/02/2017 1621    Lab Results  Component Value Date   HGBA1C 5.6 12/10/2020    Assessment & Plan:  1. Urinary symptom or sign UA is negative for UTI - POCT URINALYSIS DIP (CLINITEK) - Urine Culture  2. Viral illness Rapid flu testing negative We will need to check for COVID-19 especially given her myalgias and fatigue - POCT Influenza A/B - Novel Coronavirus, NAA ([facility])  3. Pulmonary emphysema, unspecified emphysema type (HCC) Oxygen saturation is 97% and physical exam does not reveal she is in any respiratory distress, she has no evidence of COPD exacerbation Encouraged that she needs to be on a maintenance therapy for emphysema We will switch from Advair which she has not been adherent with to Trelegy Smoking cessation will be beneficial - Fluticasone-Umeclidin-Vilant (TRELEGY ELLIPTA) 100-62.5-25 MCG/ACT AEPB; Inhale 1 puff into the lungs daily.  Dispense: 1 each; Refill: 3  4. Musculoskeletal back pain Could explain her left-sided back pain Advised to apply heat or ice whichever is tolerated to painful areas. Counseled on evidence of improvement in pain control with regards to yoga, water aerobics, massage, home physical therapy, exercise as tolerated. - tiZANidine (ZANAFLEX) 4 MG tablet; Take 1 tablet (4 mg total) by mouth every 8 (eight) hours as needed for muscle spasms.  Dispense: 60 tablet; Refill: 0   Meds ordered this encounter  Medications   Fluticasone-Umeclidin-Vilant (TRELEGY ELLIPTA) 100-62.5-25 MCG/ACT AEPB     Sig: Inhale 1 puff into the lungs daily.    Dispense:  1 each    Refill:  3    Discontinue Advair   tiZANidine (ZANAFLEX) 4 MG tablet    Sig: Take 1 tablet (4 mg total) by mouth every 8 (eight) hours as needed for muscle spasms.    Dispense:  60 tablet    Refill:  0    Follow-up: Return for previously scheduled appointment.       Hoy Register, MD, FAAFP. [facility] [place], [place] [phone]   03/23/2022, 12:24 PM

## 2022-03-23 NOTE — Progress Notes (Signed)
Lower back pain SOB Fatigue.

## 2022-03-24 LAB — NOVEL CORONAVIRUS, NAA: SARS-CoV-2, NAA: NOT DETECTED

## 2022-03-25 ENCOUNTER — Telehealth: Payer: Self-pay | Admitting: Emergency Medicine

## 2022-03-25 LAB — URINE CULTURE: Organism ID, Bacteria: NO GROWTH

## 2022-03-25 NOTE — Telephone Encounter (Signed)
Pt was called and informed of lab results. 

## 2022-03-25 NOTE — Telephone Encounter (Signed)
Copied from [place] [phone]. Topic: General - Other >> Mar 25, 2022 11:33 AM Cyndi Bender wrote: Reason for CRM: Pt requests call back to go over her for most recent lab results. Cb# [id number]

## 2022-03-30 ENCOUNTER — Ambulatory Visit: Payer: Medicare Other | Admitting: Pharmacist

## 2022-04-28 ENCOUNTER — Other Ambulatory Visit: Payer: Self-pay | Admitting: Family Medicine

## 2022-04-28 DIAGNOSIS — I10 Essential (primary) hypertension: Secondary | ICD-10-CM

## 2022-05-10 ENCOUNTER — Ambulatory Visit: Payer: Medicare Other | Attending: Family Medicine | Admitting: Pharmacist

## 2022-05-10 VITALS — BP 129/81 | HR 74

## 2022-05-10 DIAGNOSIS — I10 Essential (primary) hypertension: Secondary | ICD-10-CM

## 2022-05-10 MED ORDER — LOSARTAN POTASSIUM 50 MG PO TABS
50.0000 mg | ORAL_TABLET | Freq: Every day | ORAL | 1 refills | Status: DC
Start: 2022-05-10 — End: 2022-07-27

## 2022-05-10 NOTE — Progress Notes (Signed)
   S:    No chief complaint on file.  61 y.o. female who presents for hypertension evaluation, education, and management. PMH is significant for COPD, hypertension, tobacco abuse (smoking since elementary school), COVID-19 in 02/2021. Patient was referred by Primary Care Provider, Dr. Alvis Lemmings, on 09/16/2021. Last seen by PCP on 03/23/2022. Last seen by pharmacy clinic on 01/15/2022.    At last visit with pharmacy, amlodipine 5 mg daily was started.   Today, patient arrives in good spirits and presents without assistance. States she has had a cough for ~2 weeks and she requests a trial off lisinopril. Of note, she states her grandson has had a recent URI he got from daycare. Denies dizziness, headache, blurred vision, swelling.   Patient reports hypertension is longstanding.  Family/Social history:  Fhx: heart disease (mother had MI x3), heart failure, asthma Smoker- 1 PPD Alcohol: none   Medication adherence reported. Patient took BP medications last night.   Current antihypertensives include: amlodipine 5 mg daily, lisinopril 20 mg daily  Antihypertensives tried in the past include: losartan (self DC'd in 2019 due to concern for recall)  Reported home BP readings: doesn't check a whole lot   O:   Vitals:   05/10/22 1100  BP: 129/81  Pulse: 74    Last 3 Office BP readings: BP Readings from Last 3 Encounters:  05/10/22 129/81  03/23/22 (!) 146/82  02/08/22 (!) 148/87    BMET    Component Value Date/Time   NA 141 12/08/2021 1035   K 4.3 12/08/2021 1035   CL 105 12/08/2021 1035   CO2 21 12/08/2021 1035   GLUCOSE 84 12/08/2021 1035   GLUCOSE 108 (H) 05/06/2021 1433   BUN 9 12/08/2021 1035   CREATININE 0.62 12/08/2021 1035   CREATININE 0.67 08/11/2015 1054   CALCIUM 9.5 12/08/2021 1035   GFRNONAA >60 05/06/2021 1433   GFRNONAA >89 08/11/2015 1054   GFRAA >60 01/09/2020 2253   GFRAA >89 08/11/2015 1054    Clinical ASCVD: No  The 10-year ASCVD risk score (Arnett DK, et  al., 2019) is: 12.4%   Values used to calculate the score:     Age: 34 years     Sex: Female     Is Non-Hispanic African American: No     Diabetic: No     Tobacco smoker: Yes     Systolic Blood Pressure: 129 mmHg     Is BP treated: Yes     HDL Cholesterol: 55 mg/dL     Total Cholesterol: 219 mg/dL   A/P: Hypertension longstanding currently at goal on current medications. BP goal < 130/80 mmHg. Medication adherence appears appropriate. Patient is requesting a trial off lisinopril due to new onset cough.  -Discontinued lisinopril given cough. Start losartan 50 mg daily.   -Patient educated on purpose, proper use, and potential adverse effects of losartan.  -Counseled on lifestyle modifications for blood pressure control including reduced dietary sodium, increased exercise, adequate sleep. -Encouraged patient to check BP at home and bring log of readings to next visit. Counseled on proper use of home BP cuff.   Patient verbalized understanding of treatment plan.  Total time in face to face counseling 20 minutes.    Follow-up:  Pharmacist PRN. PCP clinic visit in 2 days.   Valeda Malm, Pharm.D. PGY-2 Ambulatory Care Pharmacy Resident 05/10/2022 2:24 PM

## 2022-05-12 ENCOUNTER — Encounter: Payer: Self-pay | Admitting: Family Medicine

## 2022-05-12 ENCOUNTER — Ambulatory Visit: Payer: Medicare Other | Attending: Family Medicine | Admitting: Family Medicine

## 2022-05-12 VITALS — BP 149/83 | HR 77 | Ht 64.0 in | Wt 152.0 lb

## 2022-05-12 DIAGNOSIS — Z1231 Encounter for screening mammogram for malignant neoplasm of breast: Secondary | ICD-10-CM | POA: Diagnosis not present

## 2022-05-12 DIAGNOSIS — Z130 Encounter for screening for diseases of the blood and blood-forming organs and certain disorders involving the immune mechanism: Secondary | ICD-10-CM | POA: Diagnosis not present

## 2022-05-12 DIAGNOSIS — Z13228 Encounter for screening for other metabolic disorders: Secondary | ICD-10-CM

## 2022-05-12 DIAGNOSIS — Z0000 Encounter for general adult medical examination without abnormal findings: Secondary | ICD-10-CM

## 2022-05-12 NOTE — Patient Instructions (Signed)
  Maria Oliver , Thank you for taking time to come for your Medicare Wellness Visit. I appreciate your ongoing commitment to your health goals. Please review the following plan we discussed and let me know if I can assist you in the future.   These are the goals we discussed:  Goals   None     This is a list of the screening recommended for you and due dates:  Health Maintenance  Topic Date Due   COVID-19 Vaccine (1) Never done   Colon Cancer Screening  03/04/2019   Zoster (Shingles) Vaccine (1 of 2) 08/12/2022*   Flu Shot  09/12/2022*   Screening for Lung Cancer  09/09/2022   Mammogram  11/18/2022   Medicare Annual Wellness Visit  05/13/2023   DTaP/Tdap/Td vaccine (2 - Td or Tdap) 10/08/2023   Pap Smear  11/14/2023   Hepatitis C Screening: USPSTF Recommendation to screen - Ages 18-79 yo.  Completed   HIV Screening  Completed   HPV Vaccine  Aged Out  *Topic was postponed. The date shown is not the original due date.

## 2022-05-12 NOTE — Progress Notes (Signed)
Subjective:   Maria Oliver is a 61 y.o. female who presents for Medicare Annual (Subsequent) preventive examination.  She is yet to perform her Cologuard test even though she was given the kit a while back.  She is not interested in colonoscopy.  Review of Systems    General: negative for fever, weight loss, appetite change Eyes: no visual symptoms. ENT: no ear symptoms, no sinus tenderness, no nasal congestion or sore throat. Neck: no pain  Respiratory: no wheezing, shortness of breath, cough, positive for nasal congestion Cardiovascular: no chest pain, no dyspnea on exertion, no pedal edema, no orthopnea. Gastrointestinal: no abdominal pain, no diarrhea, no constipation Genito-Urinary: no urinary frequency, no dysuria, no polyuria. Hematologic: no bruising Endocrine: no cold or heat intolerance Neurological: no headaches, no seizures, no tremors Musculoskeletal: no joint pains, no joint swelling Skin: no pruritus, no rash. Psychological: no depression, no anxiety,   Cardiac Risk Factors include: none     Objective:    Today's Vitals   05/12/22 1509  BP: (!) 149/83  Pulse: 77  SpO2: 97%  Weight: 152 lb (68.9 kg)  Height: _0  (1.626 m)  PainSc: 8    Body mass index is 26.09 kg/m.     05/12/2022    3:13 PM 03/08/2021    1:09 PM 02/23/2021   10:48 PM 02/22/2021    3:39 PM 10/27/2020    1:58 PM 04/09/2019    3:27 PM 11/28/2017    2:07 PM  Advanced Directives  Does Patient Have a Medical Advance Directive? No No  No No No No  Would patient like information on creating a medical advance directive? Yes (ED - Information included in AVS) Yes (ED - Information included in AVS) Yes (Inpatient - patient defers creating a medical advance directive and declines information at this time)   Yes (MAU/Ambulatory/Procedural Areas - Information given) No - Patient declined    Current Medications (verified) Outpatient Encounter Medications as of 05/12/2022  Medication Sig    acetaminophen (TYLENOL) 500 MG tablet Take 500-1,000 mg by mouth daily as needed for headache, mild pain or fever.   albuterol (VENTOLIN HFA) 108 (90 Base) MCG/ACT inhaler Inhale 2 puffs into the lungs every 6 (six) hours as needed for wheezing or shortness of breath.   amLODipine (NORVASC) 5 MG tablet TAKE 1 TABLET(5 MG) BY MOUTH DAILY   fluticasone (FLONASE) 50 MCG/ACT nasal spray Place 1 spray into both nostrils daily.   Fluticasone-Umeclidin-Vilant (TRELEGY ELLIPTA) 100-62.5-25 MCG/ACT AEPB Inhale 1 puff into the lungs daily.   lactose free nutrition (BOOST) LIQD Take 237 mLs by mouth every other day.   losartan (COZAAR) 50 MG tablet Take 1 tablet (50 mg total) by mouth daily.   Multiple Vitamin (MULTIVITAMIN WITH MINERALS) TABS tablet Take 1 tablet by mouth daily.   tiZANidine (ZANAFLEX) 4 MG tablet Take 1 tablet (4 mg total) by mouth every 8 (eight) hours as needed for muscle spasms.   cetirizine (ZYRTEC) 10 MG tablet Take 1 tablet (10 mg total) by mouth daily. (Patient not taking: Reported on 05/12/2022)   nicotine (NICODERM CQ) 14 mg/24hr patch Place 1 patch (14 mg total) onto the skin daily. For 4 weeks then switch to 7 mg per 24 hours thereafter (Patient not taking: Reported on 05/12/2022)   ondansetron (ZOFRAN) 4 MG tablet Take 1 tablet (4 mg total) by mouth every 6 (six) hours. (Patient not taking: Reported on 05/12/2022)   rosuvastatin (CRESTOR) 5 MG tablet Take 1 tablet (5 mg  total) by mouth daily. (Patient not taking: Reported on 05/12/2022)   No facility-administered encounter medications on file as of 05/12/2022.    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 09/09/2016   Generalized headaches    Hernia, inguinal, right    Hypertension    Leg swelling    Past Surgical History:  Procedure Laterality Date   COLON SURGERY  2010   per patient "colon take down"   COLOSTOMY  2010   LAPAROSCOPIC CHOLECYSTECTOMY SINGLE SITE WITH INTRAOPERATIVE CHOLANGIOGRAM N/A 09/09/2016   Procedure: LAPAROSCOPIC CHOLECYSTECTOMY  WITH INTRAOPERATIVE CHOLANGIOGRAM;  Surgeon: Michael Boston, MD;  Location: [facility];  Service: General;  Laterality: N/A;   TUBAL LIGATION     Family History  Problem Relation Age of Onset   Heart disease Mother 88   Heart failure Mother    Asthma Mother    Emphysema Mother    Alzheimer's disease Father 31   Emphysema Brother    Asthma Brother    Colon cancer Neg Hx    Social History   Socioeconomic History   Marital status: Widowed    Spouse name: Not on file   Number of children: 3   Years of education: Not on file   Highest education level: Not on file  Occupational History   Occupation: disabled  Tobacco Use   Smoking status: Every Day    Packs/day: 1.00    Years: 47.00    Total pack years: 47.00    Types: Cigarettes   Smokeless tobacco: Never   Tobacco comments:    1/2 ppd 02/25/20   Vaping Use   Vaping Use: Never used  Substance and Sexual Activity   Alcohol use: No    Alcohol/week: 0.0 standard drinks of alcohol   Drug use: No   Sexual activity: Not Currently    Birth control/protection: Surgical, None  Other Topics Concern   Not on file  Social History Narrative   Not on file   Social Determinants of Health   Financial Resource Strain: Low Risk  (05/12/2022)   Overall Financial Resource Strain (CARDIA)    Difficulty of Paying Living Expenses: Not hard at all  Food Insecurity: No Food Insecurity (05/12/2022)   Hunger  Vital Sign    Worried About Running Out of Food in the Last Year: Never true    [place] in the Last Year: Never true  Transportation Needs: No Transportation Needs (05/12/2022)   PRAPARE - Hydrologist (Medical): No    Lack of Transportation (Non-Medical): No  Physical Activity: Inactive (05/12/2022)   Exercise Vital Sign    Days of Exercise per Week: 0 days    Minutes of Exercise per Session: 0 min  Stress: No Stress Concern Present (05/12/2022)   [place]    Feeling of Stress : Not at all  Social Connections: Socially Isolated (05/12/2022)   Social Connection and Isolation Panel [NHANES]    Frequency of  Communication with Friends and Family: More than three times a week    Frequency of Social Gatherings with Friends and Family: More than three times a week    Attends Religious Services: Never    Marine scientist or Organizations: No    Attends Archivist Meetings: Never    Marital Status: Widowed    Tobacco Counseling Ready to quit: Not Answered Counseling given: Not Answered Tobacco comments: 1/2 ppd 02/25/20    Clinical Intake:  Pre-visit preparation completed: No  Pain : 0-10 Pain Score: 8      Diabetes: No     Diabetic?No  Interpreter Needed?: No      Activities of Daily Living    05/12/2022    3:13 PM  In your present state of health, do you have any difficulty performing the following activities:  Hearing? 0  Vision? 1  Difficulty concentrating or making decisions? 0  Walking or climbing stairs? 1  Dressing or bathing? 0  Doing errands, shopping? 0  Preparing Food and eating ? N  Using the Toilet? N  In the past six months, have you accidently leaked urine? N  Do you have problems with loss of bowel control? N  Managing your Medications? N  Managing your Finances? N  Housekeeping or managing your Housekeeping? N    Patient  Care Team: Charlott Rakes, MD as PCP - General (Family Medicine) Carol Ada, MD as Consulting Physician (Gastroenterology) Michael Boston, MD as Consulting Physician (General Surgery) Iran Planas, MD as Consulting Physician (Orthopedic Surgery) Pleasant, Eppie Gibson, RN as [place] any recent [place] you may have received from other than [facility] providers in the past year (date may be approximate).     Assessment:   This is a routine wellness examination for [place].  Hearing/Vision screen No results found.  Dietary issues and exercise activities discussed: Current Exercise Habits: The patient does not participate in regular exercise at present, Exercise limited by: None identified   Goals Addressed   None   Depression Screen    05/12/2022    3:12 PM 08/25/2021    1:57 PM 05/26/2021    4:18 PM 03/11/2021    3:52 PM 03/08/2021    1:10 PM 12/10/2020    3:03 PM 09/25/2020    2:16 PM  PHQ 2/9 Scores  PHQ - 2 Score _0 PHQ- 9 Score _1 Fall Risk    05/12/2022    3:13 PM 03/23/2022   11:48 AM 11/12/2021    2:49 PM 08/25/2021    1:52 PM 08/11/2021    3:24 PM  Fall Risk   Falls in the past year? 0 0 0 0 0  Number falls in past yr: 0 0 0 0 0  Injury with Fall? 0 0 0 0 0  Risk for fall due to : No Fall Risks No Fall Risks     Follow up     Falls evaluation completed    FALL RISK PREVENTION PERTAINING TO THE HOME:  Any stairs in or around the home? No  If so, are there any without handrails? No  Home free of loose throw rugs in walkways, pet beds, electrical cords, etc? Yes  Adequate lighting in your home to reduce risk of falls? Yes   ASSISTIVE DEVICES UTILIZED TO PREVENT FALLS:  Life alert? No  Use of a cane, walker or  w/c? No  Grab bars in the bathroom? Yes  Shower chair or bench in shower? No  Elevated toilet seat or a handicapped toilet? No   TIMED UP AND GO:  Was the test performed? Yes  .  Length of time to ambulate 10 feet: 8 sec.   Gait slow and steady without use of assistive device  Cognitive Function:        03/08/2021    1:06 PM  6CIT Screen  What Year? 0 points  What month? 0 points  What time? 0 points  Count back from 20 0 points  Months in reverse 0 points  Repeat phrase 2 points  Total Score 2 points    Immunizations Immunization History  Administered Date(s) Administered   Tdap 10/07/2013   Screening Tests Health Maintenance  Topic Date Due   COVID-19 Vaccine (1) Never done   COLONOSCOPY (Pts 45-26yr Insurance coverage will need to be confirmed)  03/04/2019   Zoster Vaccines- Shingrix (1 of 2) 08/12/2022 (Originally 08/07/2008)   INFLUENZA VACCINE  09/12/2022 (Originally 01/12/2022)   Lung Cancer Screening  09/09/2022   MAMMOGRAM  11/18/2022   Medicare Annual Wellness (AWV)  05/13/2023   DTaP/Tdap/Td (2 - Td or Tdap) 10/08/2023   PAP SMEAR-Modifier  11/14/2023   Hepatitis C Screening  Completed   HIV Screening  Completed   HPV VACCINES  Aged Out    Health Maintenance  Health Maintenance Due  Topic Date Due   COVID-19 Vaccine (1) Never done   COLONOSCOPY (Pts 45-455yrInsurance coverage will need to be confirmed)  03/04/2019    Colorectal cancer screening: Type of screening: Cologuard. Completed No. Advised to turn in kit. Repeat every 3 years  Mammogram status: Completed 11/2020. Repeat every year    Lung Cancer Screening: (Low Dose CT Chest recommended if Age 63-80ears, 30 pack-year currently smoking OR have quit w/in 15years.) does qualify.   Lung Cancer Screening Referral: completed 08/2021  Additional Screening:  Hepatitis C Screening: does qualify; Completed 11/2020  Dental Screening: Recommended annual dental exams for proper oral hygiene  Community Resource Referral / Chronic Care Management: CRR required this visit?  No   CCM required this visit?  No      Plan:   1. Encounter for Medicare annual wellness  exam Counseled on 150 minutes of exercise per week, healthy eating (including decreased daily intake of saturated fats, cholesterol, added sugars, sodium), routine healthcare maintenance.   2. Screening for deficiency anemia - CBC with Differential/Platelet  3. Screening for metabolic disorder - CMKLK91+PHXT4. Encounter for screening mammogram for malignant neoplasm of breast - MM 3D SCREEN BREAST BILATERAL; Future   I have personally reviewed and noted the following in the patient's chart:   Medical and social history Use of alcohol, tobacco or illicit drugs  Current medications and supplements including opioid prescriptions. Patient is not currently taking opioid prescriptions. Functional ability and status Nutritional status Physical activity Advanced directives List of other physicians Hospitalizations, surgeries, and ER visits in previous 12 months Vitals Screenings to include cognitive, depression, and falls Referrals and appointments  In addition, I have reviewed and discussed with patient certain preventive protocols, quality metrics, and best practice recommendations. A written personalized care plan for preventive services as well as general preventive health recommendations were provided to patient.     EnCharlott RakesMD   05/12/2022

## 2022-05-13 LAB — CMP14+EGFR
ALT: 16 IU/L (ref 0–32)
AST: 16 IU/L (ref 0–40)
Albumin/Globulin Ratio: 1.9 (ref 1.2–2.2)
Albumin: 4.6 g/dL (ref 3.9–4.9)
Alkaline Phosphatase: 100 IU/L (ref 44–121)
BUN/Creatinine Ratio: 15 (ref 12–28)
BUN: 12 mg/dL (ref 8–27)
Bilirubin Total: <0.2 mg/dL (ref 0.0–1.2)
CO2: 27 mmol/L (ref 20–29)
Calcium: 10.3 mg/dL (ref 8.7–10.3)
Chloride: 99 mmol/L (ref 96–106)
Creatinine, Ser: 0.79 mg/dL (ref 0.57–1.00)
Globulin, Total: 2.4 g/dL (ref 1.5–4.5)
Glucose: 88 mg/dL (ref 70–99)
Potassium: 4.3 mmol/L (ref 3.5–5.2)
Sodium: 140 mmol/L (ref 134–144)
Total Protein: 7 g/dL (ref 6.0–8.5)
eGFR: 84 mL/min/{1.73_m2} (ref >59)

## 2022-05-13 LAB — CBC WITH DIFFERENTIAL/PLATELET
Basophils Absolute: 0.1 10*3/uL (ref 0.0–0.2)
Basos: 1 %
EOS (ABSOLUTE): 0.6 10*3/uL — ABNORMAL HIGH (ref 0.0–0.4)
Eos: 5 %
Hematocrit: 47.7 % — ABNORMAL HIGH (ref 34.0–46.6)
Hemoglobin: 15 g/dL (ref 11.1–15.9)
Immature Grans (Abs): 0 10*3/uL (ref 0.0–0.1)
Immature Granulocytes: 0 %
Lymphocytes Absolute: 3.7 10*3/uL — ABNORMAL HIGH (ref 0.7–3.1)
Lymphs: 31 %
MCH: 27.6 pg (ref 26.6–33.0)
MCHC: 31.4 g/dL — ABNORMAL LOW (ref 31.5–35.7)
MCV: 88 fL (ref 79–97)
Monocytes Absolute: 0.9 10*3/uL (ref 0.1–0.9)
Monocytes: 8 %
Neutrophils Absolute: 6.7 10*3/uL (ref 1.4–7.0)
Neutrophils: 55 %
Platelets: 393 10*3/uL (ref 150–450)
RBC: 5.43 x10E6/uL — ABNORMAL HIGH (ref 3.77–5.28)
RDW: 13.2 % (ref 11.7–15.4)
WBC: 12 10*3/uL — ABNORMAL HIGH (ref 3.4–10.8)

## 2022-05-19 ENCOUNTER — Ambulatory Visit: Payer: Self-pay | Admitting: *Deleted

## 2022-05-19 NOTE — Telephone Encounter (Signed)
Reason for Disposition . [1] Follow-up call to recent contact AND [2] information only call, no triage required  Answer Assessment - Initial Assessment Questions 1. REASON FOR CALL or QUESTION: "What is your reason for calling today?" or "How can I best help you?" or "What question do you have that I can help answer?"     Pt called in and was given her lab result message from Dr Alvis Lemmings.  Protocols used: Information Only Call - No Triage-A-AH

## 2022-05-26 ENCOUNTER — Ambulatory Visit: Payer: Self-pay | Admitting: *Deleted

## 2022-05-26 NOTE — Telephone Encounter (Signed)
  Chief Complaint: Left ear pain with pain around her ear, facial area and in her neck. Symptoms: Has a gland that is very painful to the touch below her ear area. Frequency: N/A Pertinent Negatives: Patient denies fever or drainage Disposition: [] ED /[] Urgent Care (no appt availability in office) / [x] Appointment(In office/virtual)/ []  [facility]/ [] Home Care/ [] Refused Recommended Disposition /[] [facility]/ []  Follow-up with PCP Additional Notes: Pt. Already had an appt scheduled for 05/27/2022 with , PA-C.

## 2022-05-26 NOTE — Telephone Encounter (Signed)
Message from Randol Kern sent at 05/26/2022  2:54 PM EST  Summary: Possible ear infection   Scheduled for tomorrow with Georgian Co  ----- Message from Randol Kern sent at 05/26/2022  2:52 PM EST ----- Pt has a bad ear infection on her left side, she has pain around her neck, chin, and ear. No appt available soon enough, seeking assistance.          Call History   Type Contact Phone/Fax User  05/26/2022 02:52 PM EST Phone (Incoming) Merissa, Renwick (Self) [phone] Judie Petit) Eason, Dominiqu   Reason for Disposition  Earache  (Exceptions: brief ear pain of < 60 minutes duration, earache occurring during air travel  Answer Assessment - Initial Assessment Questions 1. LOCATION: "Which ear is involved?"     Left ear started 2 days ago    My neck is hurting and around my face area.   There's a place on my neck I can't hardly touch it's so sore.    Nasal congestion a week before this.    2. ONSET: "When did the ear start hurting"      2 days ago    Right ear fine. 3. SEVERITY: "How bad is the pain?"  (Scale 1-10; mild, moderate or severe)   - MILD (1-3): doesn't interfere with normal activities    - MODERATE (4-7): interferes with normal activities or awakens from sleep    - SEVERE (8-10): excruciating pain, unable to do any normal activities      7/10 4. URI SYMPTOMS: "Do you have a runny nose or cough?"     A little No sore throat or coughing.    I have COPD so when I do cough it hurts. 5. FEVER: "Do you have a fever?" If Yes, ask: "What is your temperature, how was it measured, and when did it start?"     No I'm taking Tylenol.   Just tired and run down.   6. CAUSE: "Have you been swimming recently?", "How often do you use Q-TIPS?", "Have you had any recent air travel or scuba diving?"     I did get water in my ear from showering 3-4 days. 7. OTHER SYMPTOMS: "Do you have any other symptoms?" (e.g., headache, stiff neck, dizziness, vomiting, runny nose, decreased  hearing)     Gland that is very sore around my ear area and my chin and under my jaw. 8. PREGNANCY: "Is there any chance you are pregnant?" "When was your last menstrual period?"     N/A  Protocols used: Davina Poke

## 2022-05-27 ENCOUNTER — Ambulatory Visit: Payer: Medicare Other | Attending: Physician Assistant | Admitting: Physician Assistant

## 2022-05-27 ENCOUNTER — Encounter: Payer: Self-pay | Admitting: Physician Assistant

## 2022-05-27 VITALS — BP 121/79 | HR 97 | Wt 152.2 lb

## 2022-05-27 DIAGNOSIS — B029 Zoster without complications: Secondary | ICD-10-CM | POA: Diagnosis not present

## 2022-05-27 DIAGNOSIS — M549 Dorsalgia, unspecified: Secondary | ICD-10-CM | POA: Diagnosis not present

## 2022-05-27 DIAGNOSIS — H9202 Otalgia, left ear: Secondary | ICD-10-CM

## 2022-05-27 MED ORDER — VALACYCLOVIR HCL 1 G PO TABS: 1000.0000 mg | ORAL_TABLET | Freq: Three times a day (TID) | ORAL | 0 refills | Status: AC

## 2022-05-27 MED ORDER — AZITHROMYCIN 250 MG PO TABS: ORAL_TABLET | ORAL | 0 refills | Status: AC

## 2022-05-27 MED ORDER — TIZANIDINE HCL 4 MG PO TABS: 4.0000 mg | ORAL_TABLET | Freq: Three times a day (TID) | ORAL | 0 refills | Status: DC | PRN

## 2022-05-27 NOTE — Patient Instructions (Signed)

## 2022-05-27 NOTE — Progress Notes (Signed)
Patient ID: Maria Oliver, female   DOB: 09-30-1960, 61 y.o.   MRN: [id number]   Maria Oliver, is a 61 y.o. female  HER:[id number]  EHU:[id number]  DOB - [birth year]  Chief Complaint  Patient presents with   Otitis Media       Subjective:   Maria Oliver is a 61 y.o. female here today for pain in L side of face, L ear and into L neck.  No ST.  Symptoms for about 3 days.  4 and 30 yr old grandchildren live with her.(They are vaccinated)  No fever but she does generally feel bad and run down.  She does feel congested.  No CP/SOB.  Pain is moderate to severe.  She is taking tylenol every 4 hours.    No problems updated.  ALLERGIES: Allergies  Allergen Reactions   Aspirin Nausea Only and Other (See Comments)    GI upset/Hernia     Ciprofloxacin Nausea Only and Other (See Comments)    Tendonitis, joint pain, and nausea.  "Interfered with her HCTZ" (also)   Codeine Nausea And Vomiting and Other (See Comments)    (Also) GI upset/Hernia (CAN TOLERATE DILAUDID & MORPHINE)   Eggs Or Egg-Derived Products Nausea Only    Stomach pains   Fentanyl Nausea Only and Other (See Comments)    Tolerates Dilaudid or morphine much better   Hydrocodone Nausea And Vomiting   Lactose Intolerance (Gi) Nausea And Vomiting and Other (See Comments)    (Also) vertigo   Oxycodone Nausea And Vomiting    Tolerates hydromorphone better   Penicillin G Rash   Penicillins Rash    Has patient had a PCN reaction causing immediate rash, facial/tongue/throat swelling, SOB or lightheadedness with hypotension: Yes Has patient had a PCN reaction causing severe rash involving mucus membranes or skin necrosis: No Has patient had a PCN reaction that required hospitalization No Has patient had a PCN reaction occurring within the last 10 years: No If all of the above answers are "NO", then may proceed with Cephalosporin use.    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 Authorizing Provider  acetaminophen (TYLENOL) 500 MG tablet Take 500-1,000 mg by mouth daily as needed for headache, mild pain or fever.   Yes [provider]  albuterol (VENTOLIN HFA) 108 (90 Base) MCG/ACT inhaler Inhale 2 puffs into the lungs every 6 (six) hours as needed for wheezing or shortness of breath. 12/08/21  Yes Newlin, Enobong, MD  amLODipine (NORVASC) 5 MG tablet TAKE 1 TABLET(5 MG) BY MOUTH DAILY 04/28/22  Yes Hoy Register, MD  azithromycin (ZITHROMAX) 250 MG tablet Take 2 tablets on day 1, then 1 tablet daily on days 2 through 5 05/27/22 06/01/22 Yes [place], Marzella Schlein, PA-C  cetirizine (ZYRTEC) 10 MG tablet Take 1 tablet (10 mg total)  by mouth daily. 09/16/21  Yes Newlin, Odette Horns, MD  fluticasone (FLONASE) 50 MCG/ACT nasal spray Place 1 spray into both nostrils daily. 09/16/21  Yes Hoy Register, MD  Fluticasone-Umeclidin-Vilant (TRELEGY ELLIPTA) 100-62.5-25 MCG/ACT AEPB Inhale 1 puff into the lungs daily. 03/23/22  Yes Hoy Register, MD  lactose free nutrition (BOOST) LIQD Take 237 mLs by mouth every other day.   Yes [provider]  Multiple Vitamin (MULTIVITAMIN WITH MINERALS) TABS tablet Take 1 tablet by mouth daily.   Yes [provider]  valACYclovir (VALTREX) 1000 MG tablet Take 1 tablet (1,000 mg total) by mouth 3 (three) times daily for 7 days. 05/27/22 06/03/22 Yes Arlinda Barcelona, Marzella Schlein, PA-C  losartan  (COZAAR) 50 MG tablet Take 1 tablet (50 mg total) by mouth daily. Patient not taking: Reported on 05/27/2022 05/10/22   Hoy Register, MD  nicotine (NICODERM CQ) 14 mg/24hr patch Place 1 patch (14 mg total) onto the skin daily. For 4 weeks then switch to 7 mg per 24 hours thereafter Patient not taking: Reported on 05/12/2022 08/25/21   Hoy Register, MD  ondansetron (ZOFRAN) 4 MG tablet Take 1 tablet (4 mg total) by mouth every 6 (six) hours. Patient not taking: Reported on 05/12/2022 05/06/21   Virgina Norfolk, DO  rosuvastatin (CRESTOR) 5 MG tablet Take 1 tablet (5 mg total) by mouth daily. Patient not taking: Reported on 05/27/2022 01/15/22   Hoy Register, MD  tiZANidine (ZANAFLEX) 4 MG tablet Take 1 tablet (4 mg total) by mouth every 8 (eight) hours as needed for muscle spasms. 05/27/22   Marcoantonio Legault, Marzella Schlein, PA-C    ROS: Neg resp Neg cardiac Neg GI Neg GU Neg MS Neg psych Neg neuro  Objective:   Vitals:   05/27/22 1511  BP: 121/79  Pulse: 97  SpO2: 97%  Weight: 152 lb 3.2 oz (69 kg)   Exam General appearance : Awake, alert, not in any distress. Speech Clear. Not toxic looking HEENT: Atraumatic and Normocephalic, pupils equally reactive to light and accomodation, L TM with erythema and bulging.  R TM wnl.  L side of face along V3 is slightly more swollen and TTP and tender along L side of neck. No rash Neck: Supple, no JVD. No cervical lymphadenopathy.  Chest: Good air entry bilaterally, CTAB.  No rales/rhonchi/wheezing CVS: S1 S2 regular, no murmurs.  Extremities: B/L Lower Ext shows no edema, both legs are warm to touch Neurology: Awake alert, and oriented X 3, CN II-XII intact, Non focal Skin: No Rash  Data Review Lab Results  Component Value Date   HGBA1C 5.6 12/10/2020   HGBA1C 5.6 08/11/2015    Assessment & Plan   1. Herpes zoster without complication Likely prodrome of C3 or V3.  Tylenol, advil, rest.  If any lesions develop on tip of nose, to ED or UC.    - valACYclovir (VALTREX) 1000 MG tablet; Take 1 tablet (1,000 mg total) by mouth 3 (three) times daily for 7 days.  Dispense: 21 tablet; Refill: 0  2. Left ear pain Will cover for infection - azithromycin (ZITHROMAX) 250 MG tablet; Take 2 tablets on day 1, then 1 tablet daily on days 2 through 5  Dispense: 6 tablet; Refill: 0  3. Musculoskeletal  pain - tiZANidine (ZANAFLEX) 4 MG tablet; Take 1 tablet (4 mg total) by mouth every 8 (eight) hours as needed for muscle spasms.  Dispense: 30 tablet; Refill: 0    Return if symptoms worsen or fail to improve.  The patient was given  clear instructions to go to ER or return to medical center if symptoms don't improve, worsen or new problems develop. The patient verbalized understanding. The patient was told to call to get lab results if they haven't heard anything in the next week.      Georgian Co, PA-C [facility] [place], [place] [phone]   05/27/2022, 3:36 PM

## 2022-07-08 ENCOUNTER — Ambulatory Visit
Admission: RE | Admit: 2022-07-08 | Discharge: 2022-07-08 | Disposition: A | Discharge status: Home / Self Care | Payer: 59 | Source: Ambulatory Visit | Attending: Family Medicine | Admitting: Family Medicine

## 2022-07-08 ENCOUNTER — Other Ambulatory Visit: Payer: Self-pay | Admitting: Family Medicine

## 2022-07-08 DIAGNOSIS — Z1231 Encounter for screening mammogram for malignant neoplasm of breast: Secondary | ICD-10-CM

## 2022-07-08 DIAGNOSIS — I10 Essential (primary) hypertension: Secondary | ICD-10-CM

## 2022-07-08 NOTE — Telephone Encounter (Signed)
Future visit in 1 month. Requested Prescriptions  Pending Prescriptions Disp Refills   amLODipine (NORVASC) 5 MG tablet [Pharmacy Med Name: AMLODIPINE BESYLATE 5MG  TABLETS] 90 tablet 0    Sig: TAKE 1 TABLET(5 MG) BY MOUTH DAILY     Cardiovascular: Calcium Channel Blockers 2 Passed - 07/08/2022  9:26 AM      Passed - Last BP in normal range    BP Readings from Last 1 Encounters:  05/27/22 121/79         Passed - Last Heart Rate in normal range    Pulse Readings from Last 1 Encounters:  05/27/22 97         Passed - Valid encounter within last 6 months    Recent Outpatient Visits           1 month ago Herpes zoster without complication   [place], [place], [place]   1 month ago Encounter for [employer] annual wellness exam   [place], [place], MD   1 month ago Essential hypertension   [place], [place] L, RPH-CPP   3 months ago Urinary symptom or sign   [place], Charlane Ferretti, MD   5 months ago Essential hypertension   [place], Charlane Ferretti, MD       Future Appointments             In 1 month Margarita Rana, Charlane Ferretti, MD [place]             rosuvastatin ([place]) 5 MG tablet [Pharmacy Med Name: ROSUVASTATIN 5MG  TABLETS] 90 tablet 1    Sig: TAKE 1 TABLET(5 MG) BY MOUTH DAILY     Cardiovascular:  Antilipid - Statins 2 Failed - 07/08/2022  9:26 AM      Failed - Lipid Panel in normal range within the last 12 months    Cholesterol, Total  Date Value Ref Range Status  08/25/2021 219 (H) 100 - 199 mg/dL Final   LDL Chol Calc (NIH)  Date Value Ref Range Status  08/25/2021 145 (H) 0 - 99 mg/dL Final   HDL  Date Value Ref Range Status  08/25/2021 55 >39 mg/dL Final   Triglycerides  Date Value Ref Range Status  08/25/2021  105 0 - 149 mg/dL Final         Passed - Cr in normal range and within 360 days    Creat  Date Value Ref Range Status  08/11/2015 0.67 0.50 - 1.05 mg/dL Final   Creatinine, Ser  Date Value Ref Range Status  05/12/2022 0.79 0.57 - 1.00 mg/dL Final         Passed - Patient is not pregnant      Passed - Valid encounter within last 12 months    Recent Outpatient Visits           1 month ago Herpes zoster without complication   [place], [place]   1 month ago Encounter for [employer] annual wellness exam   [place], [place], MD   1 month ago Essential hypertension   [place], [place] L, RPH-CPP   3 months ago Urinary symptom or sign   [place],  Charlane Ferretti, MD   5 months ago Essential hypertension   [place], MD       Future Appointments             In 1 month Charlott Rakes, MD [place]

## 2022-07-24 ENCOUNTER — Observation Stay (HOSPITAL_COMMUNITY)
Admission: EM | Admit: 2022-07-24 | Discharge: 2022-07-27 | Disposition: A | Discharge status: Home / Self Care | Payer: 59 | Attending: Internal Medicine | Admitting: Internal Medicine

## 2022-07-24 ENCOUNTER — Other Ambulatory Visit: Payer: Self-pay

## 2022-07-24 ENCOUNTER — Emergency Department (HOSPITAL_COMMUNITY): Payer: 59

## 2022-07-24 ENCOUNTER — Encounter (HOSPITAL_COMMUNITY): Payer: Self-pay

## 2022-07-24 DIAGNOSIS — Z933 Colostomy status: Secondary | ICD-10-CM | POA: Diagnosis not present

## 2022-07-24 DIAGNOSIS — R55 Syncope and collapse: Secondary | ICD-10-CM

## 2022-07-24 DIAGNOSIS — F17210 Nicotine dependence, cigarettes, uncomplicated: Secondary | ICD-10-CM | POA: Diagnosis not present

## 2022-07-24 DIAGNOSIS — E860 Dehydration: Secondary | ICD-10-CM | POA: Diagnosis not present

## 2022-07-24 DIAGNOSIS — E663 Overweight: Secondary | ICD-10-CM | POA: Insufficient documentation

## 2022-07-24 DIAGNOSIS — K439 Ventral hernia without obstruction or gangrene: Secondary | ICD-10-CM | POA: Diagnosis not present

## 2022-07-24 DIAGNOSIS — N179 Acute kidney failure, unspecified: Secondary | ICD-10-CM | POA: Diagnosis not present

## 2022-07-24 DIAGNOSIS — I959 Hypotension, unspecified: Principal | ICD-10-CM | POA: Insufficient documentation

## 2022-07-24 DIAGNOSIS — E861 Hypovolemia: Secondary | ICD-10-CM

## 2022-07-24 DIAGNOSIS — I10 Essential (primary) hypertension: Secondary | ICD-10-CM | POA: Insufficient documentation

## 2022-07-24 DIAGNOSIS — Z6826 Body mass index (BMI) 26.0-26.9, adult: Secondary | ICD-10-CM | POA: Diagnosis not present

## 2022-07-24 DIAGNOSIS — Z720 Tobacco use: Secondary | ICD-10-CM

## 2022-07-24 DIAGNOSIS — Z79899 Other long term (current) drug therapy: Secondary | ICD-10-CM | POA: Diagnosis not present

## 2022-07-24 DIAGNOSIS — I9589 Other hypotension: Secondary | ICD-10-CM

## 2022-07-24 DIAGNOSIS — J439 Emphysema, unspecified: Secondary | ICD-10-CM | POA: Diagnosis not present

## 2022-07-24 DIAGNOSIS — K529 Noninfective gastroenteritis and colitis, unspecified: Secondary | ICD-10-CM | POA: Insufficient documentation

## 2022-07-24 DIAGNOSIS — J45909 Unspecified asthma, uncomplicated: Secondary | ICD-10-CM | POA: Insufficient documentation

## 2022-07-24 DIAGNOSIS — J432 Centrilobular emphysema: Secondary | ICD-10-CM | POA: Diagnosis not present

## 2022-07-24 LAB — CBC
HCT: 48.8 % — ABNORMAL HIGH (ref 36.0–46.0)
Hemoglobin: 16.5 g/dL — ABNORMAL HIGH (ref 12.0–15.0)
MCH: 29.3 pg (ref 26.0–34.0)
MCHC: 33.8 g/dL (ref 30.0–36.0)
MCV: 86.7 fL (ref 80.0–100.0)
Platelets: 369 10*3/uL (ref 150–400)
RBC: 5.63 MIL/uL — ABNORMAL HIGH (ref 3.87–5.11)
RDW: 13.8 % (ref 11.5–15.5)
WBC: 13.3 10*3/uL — ABNORMAL HIGH (ref 4.0–10.5)
nRBC: 0 % (ref 0.0–0.2)

## 2022-07-24 LAB — BASIC METABOLIC PANEL
Anion gap: 10 (ref 5–15)
BUN: 13 mg/dL (ref 8–23)
CO2: 27 mmol/L (ref 22–32)
Calcium: 10.4 mg/dL — ABNORMAL HIGH (ref 8.9–10.3)
Chloride: 100 mmol/L (ref 98–111)
Creatinine, Ser: 1.63 mg/dL — ABNORMAL HIGH (ref 0.44–1.00)
GFR, Estimated: 35 mL/min — ABNORMAL LOW (ref >60)
Glucose, Bld: 142 mg/dL — ABNORMAL HIGH (ref 70–99)
Potassium: 4.1 mmol/L (ref 3.5–5.1)
Sodium: 137 mmol/L (ref 135–145)

## 2022-07-24 LAB — HEPATIC FUNCTION PANEL
ALT: 19 U/L (ref 0–44)
AST: 21 U/L (ref 15–41)
Albumin: 3.4 g/dL — ABNORMAL LOW (ref 3.5–5.0)
Alkaline Phosphatase: 61 U/L (ref 38–126)
Bilirubin, Direct: <0.1 mg/dL (ref 0.0–0.2)
Total Bilirubin: 0.4 mg/dL (ref 0.3–1.2)
Total Protein: 5.9 g/dL — ABNORMAL LOW (ref 6.5–8.1)

## 2022-07-24 LAB — LIPASE, BLOOD: Lipase: 31 U/L (ref 11–51)

## 2022-07-24 LAB — URINALYSIS, MICROSCOPIC (REFLEX)

## 2022-07-24 LAB — URINALYSIS, ROUTINE W REFLEX MICROSCOPIC
Glucose, UA: 100 mg/dL — AB
Ketones, ur: 15 mg/dL — AB
Nitrite: NEGATIVE
Protein, ur: >300 mg/dL — AB
Specific Gravity, Urine: >1.03 — ABNORMAL HIGH (ref 1.005–1.030)
pH: 5 (ref 5.0–8.0)

## 2022-07-24 LAB — CBG MONITORING, ED: Glucose-Capillary: 117 mg/dL — ABNORMAL HIGH (ref 70–99)

## 2022-07-24 LAB — TROPONIN I (HIGH SENSITIVITY)
Troponin I (High Sensitivity): 7 ng/L (ref <18)
Troponin I (High Sensitivity): 7 ng/L (ref <18)

## 2022-07-24 MED ORDER — LACTATED RINGERS IV BOLUS
500.0000 mL | Freq: Once | INTRAVENOUS | Status: AC
  Administered 2022-07-24: 500 mL via INTRAVENOUS

## 2022-07-24 MED ORDER — PANTOPRAZOLE SODIUM 40 MG IV SOLR
40.0000 mg | Freq: Every day | INTRAVENOUS | Status: DC
  Administered 2022-07-25 – 2022-07-26 (×3): 40 mg via INTRAVENOUS
  Filled 2022-07-24 (×3): qty 10

## 2022-07-24 MED ORDER — ENOXAPARIN SODIUM 40 MG/0.4ML IJ SOSY
40.0000 mg | PREFILLED_SYRINGE | Freq: Every day | INTRAMUSCULAR | Status: DC
  Administered 2022-07-25 – 2022-07-27 (×3): 40 mg via SUBCUTANEOUS
  Filled 2022-07-24 (×3): qty 0.4

## 2022-07-24 MED ORDER — MORPHINE SULFATE (PF) 2 MG/ML IV SOLN: 1.0000 mg | INTRAVENOUS | Status: DC | PRN

## 2022-07-24 MED ORDER — LACTATED RINGERS IV BOLUS
1000.0000 mL | Freq: Once | INTRAVENOUS | Status: AC
  Administered 2022-07-24: 1000 mL via INTRAVENOUS

## 2022-07-24 MED ORDER — ONDANSETRON HCL 4 MG/2ML IJ SOLN: 4.0000 mg | Freq: Four times a day (QID) | INTRAMUSCULAR | Status: DC | PRN

## 2022-07-24 MED ORDER — LACTATED RINGERS IV SOLN: INTRAVENOUS | Status: AC

## 2022-07-24 MED ORDER — IOHEXOL 350 MG/ML SOLN
75.0000 mL | Freq: Once | INTRAVENOUS | Status: AC | PRN
  Administered 2022-07-24: 75 mL via INTRAVENOUS

## 2022-07-24 NOTE — ED Notes (Signed)
Patient transported to CT 

## 2022-07-24 NOTE — ED Triage Notes (Addendum)
Reports having a syncopal episode today.  Reports she has been taking her BP and it is low but usually it is high.  Reports also having stomach problems and having to take antacid every time she eats. She complains of left neck pain and head pain from having shingles. Patient also complains of headache.  Reports abd pain goes into back.  Patient hypotensive in triage.

## 2022-07-24 NOTE — ED Notes (Signed)
Pt assisted to restroom by this RN

## 2022-07-24 NOTE — Assessment & Plan Note (Signed)
-  Half pack per day tobacco use.  Encouraged cessation.

## 2022-07-24 NOTE — ED Provider Notes (Signed)
[facility] Provider Note   CSN: [id number] Arrival date & time: 07/24/22  1833     History {Add pertinent medical, surgical, social history, OB history to HPI:1} Chief Complaint  Patient presents with   Loss of Consciousness    Maria Oliver is a 62 y.o. female.   Loss of Consciousness      Home Medications Prior to Admission medications   Medication Sig Start Date End Date Taking? Authorizing Provider  acetaminophen (TYLENOL) 500 MG tablet Take 500-1,000 mg by mouth daily as needed for headache, mild pain or fever.    [provider]  albuterol (VENTOLIN HFA) 108 (90 Base) MCG/ACT inhaler Inhale 2 puffs into the lungs every 6 (six) hours as needed for wheezing or shortness of breath. 12/08/21   Charlott Rakes, MD  amLODipine (NORVASC) 5 MG tablet TAKE 1 TABLET(5 MG) BY MOUTH DAILY 07/08/22   Charlott Rakes, MD  cetirizine (ZYRTEC) 10 MG tablet Take 1 tablet (10 mg total) by mouth daily. 09/16/21   Charlott Rakes, MD  fluticasone (FLONASE) 50 MCG/ACT nasal spray Place 1 spray into both nostrils daily. 09/16/21   Charlott Rakes, MD  Fluticasone-Umeclidin-Vilant (TRELEGY ELLIPTA) 100-62.5-25 MCG/ACT AEPB Inhale 1 puff into the lungs daily. 03/23/22   Charlott Rakes, MD  lactose free nutrition (BOOST) LIQD Take 237 mLs by mouth every other day.    [provider]  losartan (COZAAR) 50 MG tablet Take 1 tablet (50 mg total) by mouth daily. Patient not taking: Reported on 05/27/2022 05/10/22   Charlott Rakes, MD  Multiple Vitamin (MULTIVITAMIN WITH MINERALS) TABS tablet Take 1 tablet by mouth daily.    [provider]  nicotine (NICODERM CQ) 14 mg/24hr patch Place 1 patch (14 mg total) onto the skin daily. For 4 weeks then switch to 7 mg per 24 hours thereafter Patient not taking: Reported on 05/12/2022 08/25/21   Charlott Rakes, MD  ondansetron (ZOFRAN) 4 MG tablet Take 1 tablet (4 mg total) by mouth every 6  (six) hours. Patient not taking: Reported on 05/12/2022 05/06/21   Lennice Sites, DO  rosuvastatin (CRESTOR) 5 MG tablet TAKE 1 TABLET(5 MG) BY MOUTH DAILY 07/08/22   Charlott Rakes, MD  tiZANidine (ZANAFLEX) 4 MG tablet Take 1 tablet (4 mg total) by mouth every 8 (eight) hours as needed for muscle spasms. 05/27/22   Argentina Donovan, PA-C      Allergies    Aspirin, Ciprofloxacin, Codeine, Eggs or egg-derived products, Fentanyl, Hydrocodone, Lactose intolerance (gi), Oxycodone, Penicillin g, Penicillins, and Tape    Review of Systems   Review of Systems  Cardiovascular:  Positive for syncope.    Physical Exam Updated Vital Signs BP (!) 88/64   Pulse 92   Temp 98.3 F (36.8 C)   Resp 18   Ht 5' 4"$  (1.626 m)   Wt 68.9 kg   SpO2 98%   BMI 26.09 kg/m  Physical Exam  ED Results / Procedures / Treatments   Labs (all labs ordered are listed, but only abnormal results are displayed) Labs Reviewed  CBC - Abnormal; Notable for the following components:      Result Value   WBC 13.3 (*)    RBC 5.63 (*)    Hemoglobin 16.5 (*)    HCT 48.8 (*)    All other components within normal limits  BASIC METABOLIC PANEL  URINALYSIS, ROUTINE W REFLEX MICROSCOPIC  LIPASE, BLOOD  CBG MONITORING, ED  TROPONIN I (HIGH SENSITIVITY)  EKG None  Radiology CT HEAD WO CONTRAST  Result Date: 07/24/2022 CLINICAL DATA:  Syncopal episode today. EXAM: CT HEAD WITHOUT CONTRAST TECHNIQUE: Contiguous axial images were obtained from the base of the skull through the vertex without intravenous contrast. RADIATION DOSE REDUCTION: This exam was performed according to the departmental dose-optimization program which includes automated exposure control, adjustment of the mA and/or kV according to patient size and/or use of iterative reconstruction technique. COMPARISON:  05/06/2021. FINDINGS: Brain: No evidence of acute infarction, hemorrhage, hydrocephalus, extra-axial collection or mass lesion/mass effect.  Vascular: No hyperdense vessel or unexpected calcification. Skull: Normal. Negative for fracture or focal lesion. Sinuses/Orbits: Globes and orbits are unremarkable. Scattered ethmoid sinus mucosal thickening. Inferior maxillary sinus mucosal thickening. Other: None. IMPRESSION: 1. No acute intracranial abnormalities. Electronically Signed   By: Lajean Manes M.D.   On: 07/24/2022 19:44    Procedures Procedures  {Document cardiac monitor, telemetry assessment procedure when appropriate:1}  Medications Ordered in ED Medications - No data to display  ED Course/ Medical Decision Making/ A&P   {   Click here for ABCD2, HEART and other calculatorsREFRESH Note before signing :1}                          Medical Decision Making Amount and/or Complexity of Data Reviewed Labs: ordered. Radiology: ordered.   ***  {Document critical care time when appropriate:1} {Document review of labs and clinical decision tools ie heart score, Chads2Vasc2 etc:1}  {Document your independent review of radiology images, and any outside records:1} {Document your discussion with family members, caretakers, and with consultants:1} {Document social determinants of health affecting pt's care:1} {Document your decision making why or why not admission, treatments were needed:1} Final Clinical Impression(s) / ED Diagnoses Final diagnoses:  None    Rx / DC Orders ED Discharge Orders     None

## 2022-07-24 NOTE — Assessment & Plan Note (Signed)
-  secondary to hypotension  -receiving IV fluids

## 2022-07-24 NOTE — H&P (Signed)
History and Physical    Patient: Maria Oliver [id number] DOB: Oct 18, 1960 DOA: 07/24/2022 DOS: the patient was seen and examined on 07/24/2022 PCP: Charlott Rakes, MD  Patient coming from: Home  Chief Complaint:  Chief Complaint  Patient presents with   Loss of Consciousness   HPI: Maria Oliver is a 62 y.o. female with medical history significant of HTN, COPD, anxiety, tobacco abuse who presents with syncope.   Patient had upper quadrant abdominal pain with radiation throughout her abdomen and back for the past week.  Had burning sensation of her stomach with food causing low appetite.  She tried to take antiacid which helped her belch and relieved some of her symptoms.  She has been constipated for several days as well and took Dulcolax and had 3 "good" bowel movements today. Has hx of cholecystectomy, colostomy with reversal, lysis of adhesions. Today she was ambulating outside and suddenly felt dizzy and lightheaded.  When she went home and sat down she had loss of consciousness for about 2 minutes.  When she regained consciousness she continued to feel dizzy, lightheaded and diaphoretic.  Family member checked her blood pressure multiple times and SBP was low down to 60s and they decided to present to the ED.  In the ED, afebrile, BP of 88/64 on arrival but fluid responsive.  WBC of 13.3, Hgb of 16.5  Has AKI with creatinine of 1.63 from 0.79. No other significant electrolyte abnormalities.   CT head negative  UA negative for nitrate, trace leukocyte, mod Hgb, moderate bili and >300 protein.   CT A/P with contrast showed possible enteritis contrast and large anterior pelvic wall hernia containing multiple loops of small bowel but no obstruction.   Review of Systems: As mentioned in the history of present illness. All other systems reviewed and are negative. 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 She has a 47.00 pack-year smoking history. She has never used smokeless tobacco. She reports that she does not drink alcohol and does not use drugs.  Allergies  Allergen Reactions   Aspirin Nausea Only and Other (See Comments)    GI upset/Hernia     Ciprofloxacin Nausea Only and Other (See Comments)    Tendonitis, joint pain, and nausea.  "Interfered with her HCTZ" (also)   Codeine Nausea And Vomiting and Other (See Comments)    (Also) GI upset/Hernia (CAN TOLERATE DILAUDID & MORPHINE)   Eggs Or Egg-Derived Products Nausea Only    Stomach pains   Fentanyl Nausea Only and Other (See Comments)    Tolerates Dilaudid  or morphine much better   Hydrocodone Nausea And Vomiting   Lactose Intolerance (Gi) Nausea And Vomiting and Other (See Comments)    (Also) vertigo   Oxycodone Nausea And Vomiting    Tolerates hydromorphone better   Penicillin G Rash   Penicillins Rash    Has patient had a PCN reaction causing immediate rash, facial/tongue/throat swelling, SOB or lightheadedness with hypotension: Yes Has patient had a PCN reaction causing severe rash involving mucus membranes or skin  necrosis: No Has patient had a PCN reaction that required hospitalization No Has patient had a PCN reaction occurring within the last 10 years: No If all of the above answers are "NO", then may proceed with Cephalosporin use.    Tape Rash and Other (See Comments)    Please try paper tape    Family History  Problem Relation Age of Onset   Heart disease Mother 51   Heart failure Mother    Asthma Mother    Emphysema Mother    Alzheimer's disease Father 50   Emphysema Brother    Asthma Brother    Colon cancer Neg Hx     Prior to Admission medications   Medication Sig Start Date End Date Taking? Authorizing Provider  acetaminophen (TYLENOL) 500 MG tablet Take 500-1,000 mg by mouth daily as needed for headache, mild pain or fever.    [provider]  albuterol (VENTOLIN HFA) 108 (90 Base) MCG/ACT inhaler Inhale 2 puffs into the lungs every 6 (six) hours as needed for wheezing or shortness of breath. 12/08/21   Charlott Rakes, MD  amLODipine (NORVASC) 5 MG tablet TAKE 1 TABLET(5 MG) BY MOUTH DAILY 07/08/22   Charlott Rakes, MD  cetirizine (ZYRTEC) 10 MG tablet Take 1 tablet (10 mg total) by mouth daily. 09/16/21   Charlott Rakes, MD  fluticasone (FLONASE) 50 MCG/ACT nasal spray Place 1 spray into both nostrils daily. 09/16/21   Charlott Rakes, MD  Fluticasone-Umeclidin-Vilant (TRELEGY ELLIPTA) 100-62.5-25 MCG/ACT AEPB Inhale 1 puff into the lungs daily. 03/23/22   Charlott Rakes, MD  lactose free nutrition (BOOST) LIQD Take 237 mLs by mouth every other day.    [provider]  losartan (COZAAR) 50 MG tablet Take 1 tablet (50 mg total) by mouth daily. Patient not taking: Reported on 05/27/2022 05/10/22   Charlott Rakes, MD  Multiple Vitamin (MULTIVITAMIN WITH MINERALS) TABS tablet Take 1 tablet by mouth daily.    [provider]  nicotine (NICODERM CQ) 14 mg/24hr patch Place 1 patch (14 mg total) onto the skin daily. For 4 weeks then switch to 7 mg per 24 hours  thereafter Patient not taking: Reported on 05/12/2022 08/25/21   Charlott Rakes, MD  ondansetron (ZOFRAN) 4 MG tablet Take 1 tablet (4 mg total) by mouth every 6 (six) hours. Patient not taking: Reported on 05/12/2022 05/06/21   Lennice Sites, DO  rosuvastatin (CRESTOR) 5 MG tablet TAKE 1 TABLET(5 MG) BY MOUTH DAILY 07/08/22   Charlott Rakes, MD  tiZANidine (ZANAFLEX) 4 MG tablet Take 1 tablet (4 mg total) by mouth every 8 (eight) hours as needed for muscle spasms. 05/27/22   Argentina Donovan, PA-C    Physical Exam: Vitals:   07/24/22 2200 07/24/22 2215 07/24/22 2245 07/24/22 2300  BP: 120/67 113/74 122/72 120/75  Pulse: 70 68 70 65  Resp: 20 13 14 15  $ Temp:      SpO2: 94% 97% 98% 97%  Weight:      Height:       Constitutional:  NAD, calm, comfortable, nontoxic appearing elderly female laying in bed Eyes:  lids and conjunctivae normal ENMT: Mucous membranes are moist.  Neck: normal, supple Respiratory: clear to auscultation bilaterally, no wheezing, no crackles. Normal respiratory effort. No accessory muscle use.  Cardiovascular: Regular rate and rhythm, no murmurs / rubs / gallops. No extremity edema.  Abdomen: no tenderness, soft, nondistended, no guarding, rigidity or rebound tenderness.  Large ventral reducible hernia without pain to palpation. bowel sounds positive.  Musculoskeletal: no clubbing / cyanosis. No joint deformity upper and lower extremities.  Normal muscle tone.  Skin: no rashes, lesions, ulcers.  Neurologic: CN 2-12 grossly intact. Strength 5/5 in all 4.  Psychiatric: Normal judgment and insight. Alert and oriented x 3. Normal mood. Data Reviewed:  See HPI  Assessment and Plan: * Hypotension -secondary to decrease oral intake from enteritis in the past week -presenting SBP in 80s. Has improved with fluids -continuous IV fluid overnight -hold all home antihypertensives  Syncope, vasovagal -secondary to hypotension  -receiving IV fluids  Enteritis -Full  liquid diet for bowel rest and can advance as tolerated -continue IV fluid -daily IV PPI -PRN IV low dose opioid for pain -PRN antiemetics  AKI (acute kidney injury) ([place]) -AKI with creatinine of 1.63 from 0.79 secondary to decrease oral intake -continuous IV fluid and follow repeat in the morning -hold any nephrotoxic agents  Ventral hernia without obstruction or gangrene -no evidence of obstruction on CT A/P with benign abdominal exam  COPD (chronic obstructive pulmonary disease) with emphysema (HCC) -stable. Continues to use tobacco half pack daily-encouraged cessation.   Tobacco abuse -Half pack per day tobacco use.  Encouraged cessation.      Advance Care Planning: Full  Consults: none  Family Communication: son at bedside  Severity of Illness: The appropriate patient status for this patient is OBSERVATION. Observation status is judged to be reasonable and necessary in order to provide the required intensity of service to ensure the patient's safety. The patient's presenting symptoms, physical exam findings, and initial radiographic and laboratory data in the context of their medical condition is felt to place them at decreased risk for further clinical deterioration. Furthermore, it is anticipated that the patient will be medically stable for discharge from the hospital within 2 midnights of admission.   Author: Orene Desanctis, DO 07/24/2022 11:51 PM  For on call review [email or web address].

## 2022-07-24 NOTE — Assessment & Plan Note (Signed)
-  AKI with creatinine of 1.63 from 0.79 secondary to decrease oral intake -continuous IV fluid and follow repeat in the morning -hold any nephrotoxic agents

## 2022-07-24 NOTE — Assessment & Plan Note (Signed)
-  secondary to decrease oral intake from enteritis in the past week -presenting SBP in 80s. Has improved with fluids -continuous IV fluid overnight -hold all home antihypertensives

## 2022-07-24 NOTE — Assessment & Plan Note (Addendum)
-  Full liquid diet for bowel rest and can advance as tolerated -continue IV fluid -daily IV PPI -PRN IV low dose opioid for pain -PRN antiemetics

## 2022-07-24 NOTE — Assessment & Plan Note (Signed)
-  no evidence of obstruction on CT A/P with benign abdominal exam

## 2022-07-24 NOTE — Assessment & Plan Note (Signed)
-  stable. Continues to use tobacco half pack daily-encouraged cessation.

## 2022-07-25 DIAGNOSIS — I9589 Other hypotension: Secondary | ICD-10-CM | POA: Diagnosis not present

## 2022-07-25 DIAGNOSIS — N179 Acute kidney failure, unspecified: Secondary | ICD-10-CM | POA: Diagnosis not present

## 2022-07-25 DIAGNOSIS — J432 Centrilobular emphysema: Secondary | ICD-10-CM | POA: Diagnosis not present

## 2022-07-25 DIAGNOSIS — I959 Hypotension, unspecified: Secondary | ICD-10-CM | POA: Diagnosis not present

## 2022-07-25 DIAGNOSIS — K529 Noninfective gastroenteritis and colitis, unspecified: Secondary | ICD-10-CM | POA: Diagnosis not present

## 2022-07-25 LAB — BASIC METABOLIC PANEL
Anion gap: 11 (ref 5–15)
BUN: 14 mg/dL (ref 8–23)
CO2: 21 mmol/L — ABNORMAL LOW (ref 22–32)
Calcium: 9.1 mg/dL (ref 8.9–10.3)
Chloride: 106 mmol/L (ref 98–111)
Creatinine, Ser: 1.05 mg/dL — ABNORMAL HIGH (ref 0.44–1.00)
GFR, Estimated: 60 mL/min — ABNORMAL LOW (ref >60)
Glucose, Bld: 105 mg/dL — ABNORMAL HIGH (ref 70–99)
Potassium: 4 mmol/L (ref 3.5–5.1)
Sodium: 138 mmol/L (ref 135–145)

## 2022-07-25 LAB — CBC
HCT: 42.5 % (ref 36.0–46.0)
Hemoglobin: 13.9 g/dL (ref 12.0–15.0)
MCH: 28.5 pg (ref 26.0–34.0)
MCHC: 32.7 g/dL (ref 30.0–36.0)
MCV: 87.1 fL (ref 80.0–100.0)
Platelets: 278 10*3/uL (ref 150–400)
RBC: 4.88 MIL/uL (ref 3.87–5.11)
RDW: 13.9 % (ref 11.5–15.5)
WBC: 12.1 10*3/uL — ABNORMAL HIGH (ref 4.0–10.5)
nRBC: 0 % (ref 0.0–0.2)

## 2022-07-25 MED ORDER — NICOTINE 14 MG/24HR TD PT24
14.0000 mg | MEDICATED_PATCH | Freq: Every day | TRANSDERMAL | Status: DC
  Administered 2022-07-25 – 2022-07-27 (×3): 14 mg via TRANSDERMAL
  Filled 2022-07-25 (×3): qty 1

## 2022-07-25 NOTE — Progress Notes (Signed)
PROGRESS NOTE    Maria Oliver  [id number] DOB: 01-20-1961 DOA: 07/24/2022 PCP: Charlott Rakes, MD   Brief Narrative:  The patient is a 62 year old overweight Caucasian female with a past medical history significant for manometry hypertension, COPD, anxiety, tobacco abuse as well as other comorbidities presents with loss of consciousness and syncope.  Patient had upper quadrant abdominal pain with radiation throughout her abdomen and back for the last week or so and also associated burning sensation of her stomach with food causing her to have a low appetite.  She tried to take an antacid which helped her belch and relieve some of her symptoms but she states that she been constipated for several days and took Dulcolax and had 3 "good bowel movements" she has a history of cholecystectomy, colostomy with reversal and lysis of adhesions and she is ambulating outside yesterday when she suddenly felt dizzy and lightheaded.  When she went home and sat down she had loss of consciousness for about 2 minutes.  Regained consciousness and continue to feel dizzy and lightheaded and diaphoretic.  Family ember checked on her and her blood pressure multiple times was low down to the 60s and they decided to bring her to the ED.  In the ED she was noted to have a blood pressure of 88/64 and then improved after fluids.  WBC was 13.3 and hemoglobin 16.5 likely hemoconcentrated.  She had an AKI with a creatinine of 1.63 from her baseline of 0.79.  Head CT was pursued and was negative.  UA was obtained and showed no nitrites, trace leukocytes, moderate hemoglobin, moderate bilirubin and greater than 300 protein.  CT scan of the abdomen and pelvis was done and showed diarrheal state with possible enteritis and a large anterior pelvic wall hernia containing multiple loops of small bowel but no obstruction and normal appendix.  He is also noted to have colonic diverticulosis as well as aortic atherosclerosis.  Currently she  is being admitted for the following but not limited to   Assessment and Plan: * Hypotension -Secondary to decrease oral intake from enteritis in the past week -presenting SBP in 80s. Has improved with fluids -continuous IV fluid overnight -hold all home antihypertensives -Continue to monitor blood pressures per protocol last blood pressure reading was improved and slightly elevated at 148/93  Syncope, Vasovagal -Secondary to hypotension  -Receiving IV fluids as below -Check ECHO -Check Orthostatics  -Obtain PT/OT to further evaluate and treat -Check TSH in the  AM  GastroEnteritis -Full liquid diet for bowel rest and can advance as tolerated and will go to SOFT this evening  -continue IV fluid -daily IV PPI -PRN IV low dose opioid for pain -PRN antiemetics  AKI (acute kidney injury) ([place]) -AKI with creatinine of 1.63 from 0.79 secondary to decrease oral intake -BUN/Cr Trend: Recent Labs  Lab 07/24/22 1905 07/25/22 0124  BUN 13 14  CREATININE 1.63* 1.05*  -C/w IVF Hydratioon with LR at 75 mL/hr -Avoid Nephrotoxic Medications, Contrast Dyes, Hypotension and Dehydration to Ensure Adequate Renal Perfusion and will need to Renally Adjust Meds -Continue to Monitor and Trend Renal Function carefully and repeat CMP in the AM   Ventral hernia without obstruction or gangrene -No evidence of obstruction on CT A/P with benign abdominal exam as belly is soft  COPD (chronic obstructive pulmonary disease) with emphysema ([facility]) -Stable. Continues to use tobacco half pack daily -SpO2: 95 % -encouraged cessation.   Tobacco Abuse -Half pack per day tobacco use.  Encouraged cessation. -  Nicotine 14 mg TD patch ordered  Overweight -Complicates overall prognosis and care -Estimated body mass index is 26.09 kg/m as calculated from the following:   Height as of this encounter: 5' 4"$  (1.626 m).   Weight as of this encounter: 68.9 kg.  -Weight Loss and Dietary Counseling given  DVT  prophylaxis: enoxaparin (LOVENOX) injection 40 mg Start: 07/25/22 1000    Code Status: Full Code Family Communication: No family currently at bedside  Disposition Plan:  Level of care: Telemetry Medical Status is: Observation The patient will require care spanning > 2 midnights and should be moved to inpatient because: Needs further clinical improvement and continued hydration and evaluation by PT OT prior to safe discharge disposition.  She will need to tolerate a soft diet as well and diet is being advanced today   Consultants:  None  Procedures:  As delineated as above  Antimicrobials:  Anti-infectives (From admission, onward)    None       Subjective: Seen and examined at bedside and she is feeling little bit improved but has not really eaten very much.  Denies any nausea or vomiting.  No chest pain or shortness of breath.  No other concerns or complaints at this time.  Objective: Vitals:   07/25/22 0615 07/25/22 0624 07/25/22 0730 07/25/22 1000  BP: 123/74  122/69   Pulse: (!) 59  62   Resp: 15  16   Temp:  98.3 F (36.8 C)  97.6 F (36.4 C)  TempSrc:    Oral  SpO2: 91%  91%   Weight:      Height:        Intake/Output Summary (Last 24 hours) at 07/25/2022 1036 Last data filed at 07/24/2022 2257 Gross per 24 hour  Intake 1500 ml  Output --  Net 1500 ml   Filed Weights   07/24/22 1852  Weight: 68.9 kg   Examination: Physical Exam:  Constitutional: WN/WD overweight Caucasian female currently no acute distress Respiratory: Diminished to auscultation bilaterally, no wheezing, rales, rhonchi or crackles. Normal respiratory effort and patient is not tachypenic. No accessory muscle use.  Unlabored breathing Cardiovascular: RRR, no murmurs / rubs / gallops. S1 and S2 auscultated. No extremity edema.  Abdomen: Soft, mildly-tender, distended secondary body habitus that she has a ventral hernia noted.  Bowel sounds positive.  GU: Deferred. Musculoskeletal: No  clubbing / cyanosis of digits/nails. No joint deformity upper and lower extremities.  Skin: No rashes, lesions, ulcers on limited skin evaluation. No induration; Warm and dry.  Neurologic: CN 2-12 grossly intact with no focal deficits. Romberg sign and cerebellar reflexes not assessed.  Psychiatric: Normal judgment and insight. Alert and oriented x 3. Normal mood and appropriate affect.   Data Reviewed: I have personally reviewed following labs and imaging studies  CBC: Recent Labs  Lab 07/24/22 1905 07/25/22 0124  WBC 13.3* 12.1*  HGB 16.5* 13.9  HCT 48.8* 42.5  MCV 86.7 87.1  PLT 369 0000000   Basic Metabolic Panel: Recent Labs  Lab 07/24/22 1905 07/25/22 0124  NA 137 138  K 4.1 4.0  CL 100 106  CO2 27 21*  GLUCOSE 142* 105*  BUN 13 14  CREATININE 1.63* 1.05*  CALCIUM 10.4* 9.1   GFR: Estimated Creatinine Clearance: 52.3 mL/min (A) (by C-G formula based on SCr of 1.05 mg/dL (H)). Liver Function Tests: Recent Labs  Lab 07/24/22 2309  AST 21  ALT 19  ALKPHOS 61  BILITOT 0.4  PROT 5.9*  ALBUMIN 3.4*  Recent Labs  Lab 07/24/22 1905  LIPASE 31   No results for input(s): "AMMONIA" in the last 168 hours. Coagulation Profile: No results for input(s): "INR", "PROTIME" in the last 168 hours. Cardiac Enzymes: No results for input(s): "CKTOTAL", "CKMB", "CKMBINDEX", "TROPONINI" in the last 168 hours. BNP (last 3 results) No results for input(s): "PROBNP" in the last 8760 hours. HbA1C: No results for input(s): "HGBA1C" in the last 72 hours. CBG: Recent Labs  Lab 07/24/22 2052  GLUCAP 117*   Lipid Profile: No results for input(s): "CHOL", "HDL", "LDLCALC", "TRIG", "CHOLHDL", "LDLDIRECT" in the last 72 hours. Thyroid Function Tests: No results for input(s): "TSH", "T4TOTAL", "FREET4", "T3FREE", "THYROIDAB" in the last 72 hours. Anemia Panel: No results for input(s): "VITAMINB12", "FOLATE", "FERRITIN", "TIBC", "IRON", "RETICCTPCT" in the last 72 hours. Sepsis  Labs: No results for input(s): "PROCALCITON", "LATICACIDVEN" in the last 168 hours.  No results found for this or any previous visit (from the past 240 hour(s)).   Radiology Studies: CT ABDOMEN PELVIS W CONTRAST  Result Date: 07/24/2022 CLINICAL DATA:  Concern for bowel obstruction. EXAM: CT ABDOMEN AND PELVIS WITH CONTRAST TECHNIQUE: Multidetector CT imaging of the abdomen and pelvis was performed using the standard protocol following bolus administration of intravenous contrast. RADIATION DOSE REDUCTION: This exam was performed according to the departmental dose-optimization program which includes automated exposure control, adjustment of the mA and/or kV according to patient size and/or use of iterative reconstruction technique. CONTRAST:  29m OMNIPAQUE IOHEXOL 350 MG/ML SOLN COMPARISON:  CT dated 02/22/2021. FINDINGS: Lower chest: The visualized lung bases are clear. No intra-abdominal free air or free fluid. Hepatobiliary: The liver is unremarkable. No biliary dilatation. Cholecystectomy. No retained calcified stone noted the central CBD. Pancreas: Unremarkable. No pancreatic ductal dilatation or surrounding inflammatory changes. Spleen: Several small splenic hypodense lesions are too small to characterize, possibly cysts or hemangioma. Adrenals/Urinary Tract: The adrenal glands unremarkable. Small left renal cysts. No imaging follow-up. There is no hydronephrosis on either side. There is symmetric enhancement of the kidneys. The visualized ureters appear unremarkable. The urinary bladder is collapsed. Stomach/Bowel: There is loose stool throughout the colon consistent with diarrheal state. Correlation with clinical exam and stool cultures recommended. Several scattered distal colonic diverticula without active inflammatory changes. Mildly enhancing small bowel folds may represent mild enteritis. There is a large anterior pelvic wall hernia containing multiple loops of small bowel. The neck of the  hernia defect measures approximately 5 cm in transverse axial diameter. No bowel obstruction. The appendix is normal. Vascular/Lymphatic: Mild aortoiliac atherosclerotic disease. The IVC is unremarkable. No portal venous gas. There is no adenopathy. Reproductive: The uterus and ovaries are grossly unremarkable. No adnexal masses. Other: None Musculoskeletal: Osteopenia. L3 hemangioma. No acute osseous pathology. IMPRESSION: 1. Diarrheal state with possible enteritis. Correlation with clinical exam and stool cultures recommended. 2. Large anterior pelvic wall hernia containing multiple loops of small bowel. No bowel obstruction. Normal appendix. 3. Colonic diverticulosis. 4.  Aortic Atherosclerosis (ICD10-I70.0). Electronically Signed   By: AAnner CreteM.D.   On: 07/24/2022 21:31   CT HEAD WO CONTRAST  Result Date: 07/24/2022 CLINICAL DATA:  Syncopal episode today. EXAM: CT HEAD WITHOUT CONTRAST TECHNIQUE: Contiguous axial images were obtained from the base of the skull through the vertex without intravenous contrast. RADIATION DOSE REDUCTION: This exam was performed according to the departmental dose-optimization program which includes automated exposure control, adjustment of the mA and/or kV according to patient size and/or use of iterative reconstruction technique. COMPARISON:  05/06/2021. FINDINGS: Brain:  No evidence of acute infarction, hemorrhage, hydrocephalus, extra-axial collection or mass lesion/mass effect. Vascular: No hyperdense vessel or unexpected calcification. Skull: Normal. Negative for fracture or focal lesion. Sinuses/Orbits: Globes and orbits are unremarkable. Scattered ethmoid sinus mucosal thickening. Inferior maxillary sinus mucosal thickening. Other: None. IMPRESSION: 1. No acute intracranial abnormalities. Electronically Signed   By: Lajean Manes M.D.   On: 07/24/2022 19:44    Scheduled Meds:  enoxaparin (LOVENOX) injection  40 mg Subcutaneous Daily   pantoprazole (PROTONIX)  IV  40 mg Intravenous QHS   Continuous Infusions:  lactated ringers 75 mL/hr at 07/25/22 1019    LOS: 0 days   Raiford Noble, DO [facility] Available via Epic secure chat 7am-7pm After these hours, please refer to coverage provider listed on [email or web address] 07/25/2022, 10:36 AM

## 2022-07-25 NOTE — ED Notes (Signed)
ED TO INPATIENT HANDOFF REPORT  ED Nurse Name and Phone #: Duanne Guess [phone]  S Name/Age/Gender Maria Oliver 62 y.o. female Room/Bed: 040C/040C  Code Status   Code Status: Full Code    Triage Complete: Triage complete  Chief Complaint Hypotension [I95.9]  Triage Note Reports having a syncopal episode today.  Reports she has been taking her BP and it is low but usually it is high.  Reports also having stomach problems and having to take antacid every time she eats. She complains of left neck pain and head pain from having shingles. Patient also complains of headache.  Reports abd pain goes into back.  Patient hypotensive in triage.    Allergies Allergies  Allergen Reactions   Aspirin Nausea Only and Other (See Comments)    GI upset/Hernia     Ciprofloxacin Nausea Only and Other (See Comments)    Tendonitis, joint pain, and nausea.  "Interfered with her HCTZ" (also)   Codeine Nausea And Vomiting and Other (See Comments)    (Also) GI upset/Hernia (CAN TOLERATE DILAUDID & MORPHINE)   Eggs Or Egg-Derived Products Nausea Only    Stomach pains   Fentanyl Nausea Only and Other (See Comments)    Tolerates Dilaudid or morphine much better   Hydrocodone Nausea And Vomiting   Lactose Intolerance (Gi) Nausea And Vomiting and Other (See Comments)    (Also) vertigo   Oxycodone Nausea And Vomiting    Tolerates hydromorphone better   Penicillin G Rash   Penicillins Rash    Has patient had a PCN reaction causing immediate rash, facial/tongue/throat swelling, SOB or lightheadedness with hypotension: Yes Has patient had a PCN reaction causing severe rash involving mucus membranes or skin necrosis: No Has patient had a PCN reaction that required hospitalization No Has patient had a PCN reaction occurring within the last 10 years: No If all of the above answers are "NO", then may proceed with Cephalosporin use.    Tape Rash and Other (See Comments)    Please try paper tape    Level  of Care/Admitting Diagnosis ED Disposition     ED Disposition  Admit   Condition  --   [place]: [place] [[id number]]  Level of Care: Telemetry Medical [104]  May place patient in observation at [facility] or [place] if equivalent level of care is available:: No  Covid Evaluation: Asymptomatic - no recent exposure (last 10 days) testing not required  Diagnosis: Hypotension [id number]  Admitting Physician: Orene Desanctis [id number]  Attending Physician: Orene Desanctis [id number]          B Medical/Surgery History Past Medical History:  Diagnosis Date   Abdominal pain    Anxiety    Arthritis    Asthma    Chronic cholecystitis with calculus s/p lap cholecystectomy 09/09/2016 [id number]   Complication of anesthesia    slow to arouse post operatively    COPD (chronic obstructive pulmonary disease) ([place])    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 09/09/2016   Generalized headaches    Hernia, inguinal, right    Hypertension    Leg swelling    Past Surgical History:  Procedure Laterality Date   COLON SURGERY  2010   per patient "colon take down"   [place] CHOLANGIOGRAM N/A 09/09/2016   Procedure: LAPAROSCOPIC CHOLECYSTECTOMY  WITH INTRAOPERATIVE CHOLANGIOGRAM;  Surgeon: Michael Boston, MD;  Location: [facility];  Service: General;  Laterality: N/A;   TUBAL LIGATION       A IV Location/Drains/Wounds Patient Lines/Drains/Airways Status     Active Line/Drains/Airways     Name Placement date Placement time Site Days   Peripheral IV 07/24/22 20 G Anterior;Proximal;Right Forearm 07/24/22  2020  Forearm  1   Incision - 4 Ports Abdomen Umbilicus Right;Lateral Right;Medial;Lateral Upper;Mid 09/09/16  0800  -- 2145            Intake/Output Last 24 hours  Intake/Output Summary (Last 24 hours) at 07/25/2022 1212 Last data filed at 07/24/2022 2257 Gross per 24  hour  Intake 1500 ml  Output --  Net 1500 ml    Labs/Imaging Results for orders placed or performed during the hospital encounter of 07/24/22 (from the past 48 hour(s))  Basic metabolic panel     Status: Abnormal   Collection Time: 07/24/22  7:05 PM  Result Value Ref Range   Sodium 137 135 - 145 mmol/L   Potassium 4.1 3.5 - 5.1 mmol/L   Chloride 100 98 - 111 mmol/L   CO2 27 22 - 32 mmol/L   Glucose, Bld 142 (H) 70 - 99 mg/dL    Comment: Glucose reference range applies only to samples taken after fasting for at least 8 hours.   BUN 13 8 - 23 mg/dL   Creatinine, Ser 1.63 (H) 0.44 - 1.00 mg/dL   Calcium 10.4 (H) 8.9 - 10.3 mg/dL   GFR, Estimated 35 (L) >60 mL/min    Comment: (NOTE) Calculated using the CKD-EPI Creatinine Equation (2021)    Anion gap 10 5 - 15    Comment: Performed at [facility] [address] [place], [place] [address]  CBC     Status: Abnormal   Collection Time: 07/24/22  7:05 PM  Result Value Ref Range   WBC 13.3 (H) 4.0 - 10.5 K/uL   RBC 5.63 (H) 3.87 - 5.11 MIL/uL   Hemoglobin 16.5 (H) 12.0 - 15.0 g/dL   HCT 48.8 (H) 36.0 - 46.0 %   MCV 86.7 80.0 - 100.0 fL   MCH 29.3 26.0 - 34.0 pg   MCHC 33.8 30.0 - 36.0 g/dL   RDW 13.8 11.5 - 15.5 %   Platelets 369 150 - 400 K/uL   nRBC 0.0 0.0 - 0.2 %    Comment: Performed at [facility], [place] [address] [place], [place] [address]  Lipase, blood     Status: None   Collection Time: 07/24/22  7:05 PM  Result Value Ref Range   Lipase 31 11 - 51 U/L    Comment: Performed at [facility] [address], [place], [place] [address]  Troponin I (High Sensitivity)     Status: None   Collection Time: 07/24/22  7:05 PM  Result Value Ref Range   Troponin I (High Sensitivity) 7 <18 ng/L    Comment: (NOTE) Elevated high sensitivity troponin I (hsTnI) values and significant  changes across serial measurements may suggest ACS but many other  chronic and acute conditions are known to elevate hsTnI  results.  Refer to the "Links" section for chest pain algorithms and additional  guidance. Performed at [facility], [place] [address], [place], [place] [address]   CBG monitoring, ED     Status: Abnormal   Collection Time: 07/24/22  8:52 PM  Result Value Ref Range   Glucose-Capillary 117 (H) 70 - 99 mg/dL    Comment: Glucose reference range applies only  to samples taken after fasting for at least 8 hours.  Urinalysis, Routine w reflex microscopic -Urine, Clean Catch     Status: Abnormal   Collection Time: 07/24/22  9:00 PM  Result Value Ref Range   Color, Urine YELLOW YELLOW   APPearance CLOUDY (A) CLEAR   Specific Gravity, Urine >1.030 (H) 1.005 - 1.030   pH 5.0 5.0 - 8.0   Glucose, UA 100 (A) NEGATIVE mg/dL   Hgb urine dipstick MODERATE (A) NEGATIVE   Bilirubin Urine MODERATE (A) NEGATIVE   Ketones, ur 15 (A) NEGATIVE mg/dL   Protein, ur >300 (A) NEGATIVE mg/dL   Nitrite NEGATIVE NEGATIVE   Leukocytes,Ua TRACE (A) NEGATIVE    Comment: Performed at [facility] [address], [place], [place] [address]  Urinalysis, Microscopic (reflex)     Status: Abnormal   Collection Time: 07/24/22  9:00 PM  Result Value Ref Range   RBC / HPF 0-5 0 - 5 RBC/hpf   WBC, UA 0-5 0 - 5 WBC/hpf   Bacteria, UA FEW (A) NONE SEEN   Squamous Epithelial / HPF 0-5 0 - 5 /HPF   Hyaline Casts, UA PRESENT    Ca Oxalate Crys, UA PRESENT     Comment: Performed at [facility] [address], [place], [place] [address]  Hepatic function panel     Status: Abnormal   Collection Time: 07/24/22 11:09 PM  Result Value Ref Range   Total Protein 5.9 (L) 6.5 - 8.1 g/dL   Albumin 3.4 (L) 3.5 - 5.0 g/dL   AST 21 15 - 41 U/L   ALT 19 0 - 44 U/L   Alkaline Phosphatase 61 38 - 126 U/L   Total Bilirubin 0.4 0.3 - 1.2 mg/dL   Bilirubin, Direct <0.1 0.0 - 0.2 mg/dL   Indirect Bilirubin NOT CALCULATED 0.3 - 0.9 mg/dL    Comment: Performed at [facility] [address] [place], [place]  [address]  Troponin I (High Sensitivity)     Status: None   Collection Time: 07/24/22 11:09 PM  Result Value Ref Range   Troponin I (High Sensitivity) 7 <18 ng/L    Comment: (NOTE) Elevated high sensitivity troponin I (hsTnI) values and significant  changes across serial measurements may suggest ACS but many other  chronic and acute conditions are known to elevate hsTnI results.  Refer to the "Links" section for chest pain algorithms and additional  guidance. Performed at [facility], [place] [address] [place], [place] [address]   CBC     Status: Abnormal   Collection Time: 07/25/22  1:24 AM  Result Value Ref Range   WBC 12.1 (H) 4.0 - 10.5 K/uL   RBC 4.88 3.87 - 5.11 MIL/uL   Hemoglobin 13.9 12.0 - 15.0 g/dL   HCT 42.5 36.0 - 46.0 %   MCV 87.1 80.0 - 100.0 fL   MCH 28.5 26.0 - 34.0 pg   MCHC 32.7 30.0 - 36.0 g/dL   RDW 13.9 11.5 - 15.5 %   Platelets 278 150 - 400 K/uL   nRBC 0.0 0.0 - 0.2 %    Comment: Performed at [facility], [place] [address] [place], [place] [id number]  Basic metabolic panel     Status: Abnormal   Collection Time: 07/25/22  1:24 AM  Result Value Ref Range   Sodium 138 135 - 145 mmol/L   Potassium 4.0 3.5 - 5.1 mmol/L   Chloride 106 98 - 111  mmol/L   CO2 21 (L) 22 - 32 mmol/L   Glucose, Bld 105 (H) 70 - 99 mg/dL    Comment: Glucose reference range applies only to samples taken after fasting for at least 8 hours.   BUN 14 8 - 23 mg/dL   Creatinine, Ser 1.05 (H) 0.44 - 1.00 mg/dL   Calcium 9.1 8.9 - 10.3 mg/dL   GFR, Estimated 60 (L) >60 mL/min    Comment: (NOTE) Calculated using the CKD-EPI Creatinine Equation (2021)    Anion gap 11 5 - 15    Comment: Performed at [facility] [address] [place], [place] [address]   CT ABDOMEN PELVIS W CONTRAST  Result Date: 07/24/2022 CLINICAL DATA:  Concern for bowel obstruction. EXAM: CT ABDOMEN AND PELVIS WITH CONTRAST TECHNIQUE: Multidetector CT imaging of the abdomen and pelvis was performed  using the standard protocol following bolus administration of intravenous contrast. RADIATION DOSE REDUCTION: This exam was performed according to the departmental dose-optimization program which includes automated exposure control, adjustment of the mA and/or kV according to patient size and/or use of iterative reconstruction technique. CONTRAST:  77m OMNIPAQUE IOHEXOL 350 MG/ML SOLN COMPARISON:  CT dated 02/22/2021. FINDINGS: Lower chest: The visualized lung bases are clear. No intra-abdominal free air or free fluid. Hepatobiliary: The liver is unremarkable. No biliary dilatation. Cholecystectomy. No retained calcified stone noted the central CBD. Pancreas: Unremarkable. No pancreatic ductal dilatation or surrounding inflammatory changes. Spleen: Several small splenic hypodense lesions are too small to characterize, possibly cysts or hemangioma. Adrenals/Urinary Tract: The adrenal glands unremarkable. Small left renal cysts. No imaging follow-up. There is no hydronephrosis on either side. There is symmetric enhancement of the kidneys. The visualized ureters appear unremarkable. The urinary bladder is collapsed. Stomach/Bowel: There is loose stool throughout the colon consistent with diarrheal state. Correlation with clinical exam and stool cultures recommended. Several scattered distal colonic diverticula without active inflammatory changes. Mildly enhancing small bowel folds may represent mild enteritis. There is a large anterior pelvic wall hernia containing multiple loops of small bowel. The neck of the hernia defect measures approximately 5 cm in transverse axial diameter. No bowel obstruction. The appendix is normal. Vascular/Lymphatic: Mild aortoiliac atherosclerotic disease. The IVC is unremarkable. No portal venous gas. There is no adenopathy. Reproductive: The uterus and ovaries are grossly unremarkable. No adnexal masses. Other: None Musculoskeletal: Osteopenia. L3 hemangioma. No acute osseous  pathology. IMPRESSION: 1. Diarrheal state with possible enteritis. Correlation with clinical exam and stool cultures recommended. 2. Large anterior pelvic wall hernia containing multiple loops of small bowel. No bowel obstruction. Normal appendix. 3. Colonic diverticulosis. 4.  Aortic Atherosclerosis (ICD10-I70.0). Electronically Signed   By: AAnner CreteM.D.   On: 07/24/2022 21:31   CT HEAD WO CONTRAST  Result Date: 07/24/2022 CLINICAL DATA:  Syncopal episode today. EXAM: CT HEAD WITHOUT CONTRAST TECHNIQUE: Contiguous axial images were obtained from the base of the skull through the vertex without intravenous contrast. RADIATION DOSE REDUCTION: This exam was performed according to the departmental dose-optimization program which includes automated exposure control, adjustment of the mA and/or kV according to patient size and/or use of iterative reconstruction technique. COMPARISON:  05/06/2021. FINDINGS: Brain: No evidence of acute infarction, hemorrhage, hydrocephalus, extra-axial collection or mass lesion/mass effect. Vascular: No hyperdense vessel or unexpected calcification. Skull: Normal. Negative for fracture or focal lesion. Sinuses/Orbits: Globes and orbits are unremarkable. Scattered ethmoid sinus mucosal thickening. Inferior maxillary sinus mucosal thickening. Other: None. IMPRESSION: 1. No acute intracranial abnormalities. Electronically Signed  By: Lajean Manes M.D.   On: 07/24/2022 19:44    Pending Labs Unresulted Labs (From admission, onward)    None       Vitals/Pain Today's Vitals   07/25/22 0615 07/25/22 0624 07/25/22 0730 07/25/22 1000  BP: 123/74  122/69 113/67  Pulse: (!) 59  62 62  Resp: 15  16 15  $ Temp:  98.3 F (36.8 C)  97.6 F (36.4 C)  TempSrc:    Oral  SpO2: 91%  91% 92%  Weight:      Height:      PainSc:        Isolation Precautions No active isolations  Medications Medications  enoxaparin (LOVENOX) injection 40 mg (40 mg Subcutaneous Given  07/25/22 0933)  lactated ringers infusion ( Intravenous New Bag/Given 07/25/22 1019)  morphine (PF) 2 MG/ML injection 1 mg (has no administration in time range)  ondansetron (ZOFRAN) injection 4 mg (has no administration in time range)  pantoprazole (PROTONIX) injection 40 mg (40 mg Intravenous Given 07/25/22 0051)  nicotine (NICODERM CQ - dosed in mg/24 hours) patch 14 mg (14 mg Transdermal Patch Applied 07/25/22 1156)  lactated ringers bolus 1,000 mL (0 mLs Intravenous Stopped 07/24/22 2216)  lactated ringers bolus 500 mL (0 mLs Intravenous Stopped 07/24/22 2257)  iohexol (OMNIPAQUE) 350 MG/ML injection 75 mL (75 mLs Intravenous Contrast Given 07/24/22 2116)    Mobility walks     Focused Assessments GI   R Recommendations: See Admitting Provider Note  Report given to:   Additional Notes: Pt wishes to go outside and smoke cigarette, okay with nicotine patch at this time.

## 2022-07-25 NOTE — ED Notes (Signed)
Pt ambulated to bathroom without assistance 

## 2022-07-26 ENCOUNTER — Observation Stay (HOSPITAL_BASED_OUTPATIENT_CLINIC_OR_DEPARTMENT_OTHER): Payer: 59

## 2022-07-26 ENCOUNTER — Observation Stay (HOSPITAL_COMMUNITY): Payer: 59

## 2022-07-26 DIAGNOSIS — I959 Hypotension, unspecified: Secondary | ICD-10-CM | POA: Diagnosis not present

## 2022-07-26 DIAGNOSIS — J432 Centrilobular emphysema: Secondary | ICD-10-CM | POA: Diagnosis not present

## 2022-07-26 DIAGNOSIS — R55 Syncope and collapse: Secondary | ICD-10-CM

## 2022-07-26 DIAGNOSIS — N179 Acute kidney failure, unspecified: Secondary | ICD-10-CM | POA: Diagnosis not present

## 2022-07-26 DIAGNOSIS — I9589 Other hypotension: Secondary | ICD-10-CM | POA: Diagnosis not present

## 2022-07-26 DIAGNOSIS — K529 Noninfective gastroenteritis and colitis, unspecified: Secondary | ICD-10-CM | POA: Diagnosis not present

## 2022-07-26 LAB — ECHOCARDIOGRAM COMPLETE
AR max vel: 2.91 cm2
AV Area VTI: 2.8 cm2
AV Area mean vel: 2.84 cm2
AV Mean grad: 3 mmHg
AV Peak grad: 5.6 mmHg
Ao pk vel: 1.18 m/s
Area-P 1/2: 2.99 cm2
Calc EF: 58.1 %
Height: 64 in
MV M vel: 2.59 m/s
MV Peak grad: 26.8 mmHg
S' Lateral: 2.5 cm
Single Plane A2C EF: 55.2 %
Single Plane A4C EF: 62.7 %
Weight: 2432 oz

## 2022-07-26 LAB — MAGNESIUM: Magnesium: 1.7 mg/dL (ref 1.7–2.4)

## 2022-07-26 LAB — CBC WITH DIFFERENTIAL/PLATELET
Abs Immature Granulocytes: 0.02 10*3/uL (ref 0.00–0.07)
Basophils Absolute: 0.1 10*3/uL (ref 0.0–0.1)
Basophils Relative: 1 %
Eosinophils Absolute: 0.9 10*3/uL — ABNORMAL HIGH (ref 0.0–0.5)
Eosinophils Relative: 11 %
HCT: 40.5 % (ref 36.0–46.0)
Hemoglobin: 13 g/dL (ref 12.0–15.0)
Immature Granulocytes: 0 %
Lymphocytes Relative: 40 %
Lymphs Abs: 3.3 10*3/uL (ref 0.7–4.0)
MCH: 28.3 pg (ref 26.0–34.0)
MCHC: 32.1 g/dL (ref 30.0–36.0)
MCV: 88 fL (ref 80.0–100.0)
Monocytes Absolute: 0.6 10*3/uL (ref 0.1–1.0)
Monocytes Relative: 7 %
Neutro Abs: 3.3 10*3/uL (ref 1.7–7.7)
Neutrophils Relative %: 41 %
Platelets: 253 10*3/uL (ref 150–400)
RBC: 4.6 MIL/uL (ref 3.87–5.11)
RDW: 13.8 % (ref 11.5–15.5)
WBC: 8.1 10*3/uL (ref 4.0–10.5)
nRBC: 0 % (ref 0.0–0.2)

## 2022-07-26 LAB — COMPREHENSIVE METABOLIC PANEL
ALT: 16 U/L (ref 0–44)
AST: 18 U/L (ref 15–41)
Albumin: 3.3 g/dL — ABNORMAL LOW (ref 3.5–5.0)
Alkaline Phosphatase: 53 U/L (ref 38–126)
Anion gap: 10 (ref 5–15)
BUN: 8 mg/dL (ref 8–23)
CO2: 23 mmol/L (ref 22–32)
Calcium: 8.7 mg/dL — ABNORMAL LOW (ref 8.9–10.3)
Chloride: 106 mmol/L (ref 98–111)
Creatinine, Ser: 0.64 mg/dL (ref 0.44–1.00)
GFR, Estimated: >60 mL/min (ref >60)
Glucose, Bld: 88 mg/dL (ref 70–99)
Potassium: 3.7 mmol/L (ref 3.5–5.1)
Sodium: 139 mmol/L (ref 135–145)
Total Bilirubin: 0.4 mg/dL (ref 0.3–1.2)
Total Protein: 5.7 g/dL — ABNORMAL LOW (ref 6.5–8.1)

## 2022-07-26 LAB — PHOSPHORUS: Phosphorus: 4.2 mg/dL (ref 2.5–4.6)

## 2022-07-26 LAB — TSH: TSH: 1.703 u[IU]/mL (ref 0.350–4.500)

## 2022-07-26 MED ORDER — AMLODIPINE BESYLATE 5 MG PO TABS
5.0000 mg | ORAL_TABLET | Freq: Every day | ORAL | Status: DC
  Administered 2022-07-26 – 2022-07-27 (×2): 5 mg via ORAL
  Filled 2022-07-26 (×2): qty 1

## 2022-07-26 MED ORDER — ADULT MULTIVITAMIN W/MINERALS CH
1.0000 | ORAL_TABLET | Freq: Every day | ORAL | Status: DC
  Administered 2022-07-26 – 2022-07-27 (×2): 1 via ORAL
  Filled 2022-07-26 (×2): qty 1

## 2022-07-26 MED ORDER — SIMETHICONE 80 MG PO CHEW: 80.0000 mg | CHEWABLE_TABLET | Freq: Four times a day (QID) | ORAL | Status: DC | PRN

## 2022-07-26 MED ORDER — UMECLIDINIUM BROMIDE 62.5 MCG/ACT IN AEPB
1.0000 | INHALATION_SPRAY | Freq: Every day | RESPIRATORY_TRACT | Status: DC
  Administered 2022-07-26: 1 via RESPIRATORY_TRACT
  Filled 2022-07-26: qty 7

## 2022-07-26 MED ORDER — FLUTICASONE PROPIONATE 50 MCG/ACT NA SUSP
1.0000 | Freq: Every day | NASAL | Status: DC
  Administered 2022-07-26: 1 via NASAL
  Filled 2022-07-26: qty 16

## 2022-07-26 MED ORDER — ALBUTEROL SULFATE (2.5 MG/3ML) 0.083% IN NEBU: 3.0000 mL | INHALATION_SOLUTION | Freq: Four times a day (QID) | RESPIRATORY_TRACT | Status: DC | PRN

## 2022-07-26 MED ORDER — MAGNESIUM SULFATE 2 GM/50ML IV SOLN
2.0000 g | Freq: Once | INTRAVENOUS | Status: AC
  Administered 2022-07-26: 2 g via INTRAVENOUS
  Filled 2022-07-26: qty 50

## 2022-07-26 MED ORDER — ROSUVASTATIN CALCIUM 5 MG PO TABS
5.0000 mg | ORAL_TABLET | Freq: Every day | ORAL | Status: DC
  Administered 2022-07-26 – 2022-07-27 (×2): 5 mg via ORAL
  Filled 2022-07-26 (×2): qty 1

## 2022-07-26 MED ORDER — FLUTICASONE FUROATE-VILANTEROL 100-25 MCG/ACT IN AEPB
1.0000 | INHALATION_SPRAY | Freq: Every day | RESPIRATORY_TRACT | Status: DC
  Administered 2022-07-26: 1 via RESPIRATORY_TRACT
  Filled 2022-07-26: qty 28

## 2022-07-26 MED ORDER — LISINOPRIL 20 MG PO TABS
20.0000 mg | ORAL_TABLET | Freq: Every day | ORAL | Status: DC
  Administered 2022-07-26 – 2022-07-27 (×2): 20 mg via ORAL
  Filled 2022-07-26 (×2): qty 1

## 2022-07-26 NOTE — Evaluation (Signed)
Physical Therapy Evaluation Patient Details Name: Maria Oliver MRN: [id number] DOB: 26-Aug-1960 Today's Date: 07/26/2022  History of Present Illness  Pt is a 62 y.o. female admitted 2/10 with hypotension and syncope. PMH: HTN, COPD, anxiety, tobacco abuse  Clinical Impression  PT eval complete. Pt demonstrates independence bed mobility and transfers. Supervision provided for amb 300' without AD and ascend/descend 5 steps with R rail. No LOB or unsteady gait noted. Pt reports she feels at her baseline for mobility. BP stable. All questions answered. All education complete. No further skilled PT intervention indicated. PT signing off.        Recommendations for follow up therapy are one component of a multi-disciplinary discharge planning process, led by the attending physician.  Recommendations may be updated based on patient status, additional functional criteria and insurance authorization.  Follow Up Recommendations No PT follow up      Assistance Recommended at Discharge None  Patient can return home with the following       Equipment Recommendations None recommended by PT  Recommendations for Other Services       Functional Status Assessment Patient has not had a recent decline in their functional status     Precautions / Restrictions Precautions Precautions: None      Mobility  Bed Mobility Overal bed mobility: Independent                  Transfers Overall transfer level: Independent Equipment used: None                    Ambulation/Gait Ambulation/Gait assistance: Supervision Gait Distance (Feet): 300 Feet Assistive device: None Gait Pattern/deviations: WFL(Within Functional Limits)   Gait velocity interpretation: >2.62 ft/sec, indicative of community ambulatory      Stairs Stairs: Yes Stairs assistance: Supervision Stair Management: One rail Right, Alternating pattern, Forwards Number of Stairs: 5    Wheelchair Mobility     Modified Rankin (Stroke Patients Only)       Balance Overall balance assessment: No apparent balance deficits (not formally assessed)                                           Pertinent Vitals/Pain Pain Assessment Pain Assessment: No/denies pain    Home Living Family/patient expects to be discharged to:: Private residence Living Arrangements: Children Available Help at Discharge: Family;Friend(s);Available 24 hours/day Type of Home: Mobile home Home Access: Stairs to enter Entrance Stairs-Rails: Psychiatric nurse of Steps: 4   Home Layout: One level Home Equipment: None      Prior Function Prior Level of Function : Independent/Modified Independent;Driving                     Hand Dominance        Extremity/Trunk Assessment   Upper Extremity Assessment Upper Extremity Assessment: Overall WFL for tasks assessed    Lower Extremity Assessment Lower Extremity Assessment: Overall WFL for tasks assessed    Cervical / Trunk Assessment Cervical / Trunk Assessment: Normal  Communication   Communication: No difficulties  Cognition Arousal/Alertness: Awake/alert Behavior During Therapy: WFL for tasks assessed/performed Overall Cognitive Status: Within Functional Limits for tasks assessed  General Comments General comments (skin integrity, edema, etc.): VSS on RA    Exercises     Assessment/Plan    PT Assessment Patient does not need any further PT services  PT Problem List         PT Treatment Interventions      PT Goals (Current goals can be found in the Care Plan section)  Acute Rehab PT Goals Patient Stated Goal: home PT Goal Formulation: All assessment and education complete, DC therapy    Frequency       Co-evaluation               AM-PAC PT "6 Clicks" Mobility  Outcome Measure Help needed turning from your back to your side while in a  flat bed without using bedrails?: None Help needed moving from lying on your back to sitting on the side of a flat bed without using bedrails?: None Help needed moving to and from a bed to a chair (including a wheelchair)?: None Help needed standing up from a chair using your arms (e.g., wheelchair or bedside chair)?: None Help needed to walk in hospital room?: A Little Help needed climbing 3-5 steps with a railing? : A Little 6 Click Score: 22    End of Session Equipment Utilized During Treatment: Gait belt Activity Tolerance: Patient tolerated treatment well Patient left: in bed;with call bell/phone within reach Nurse Communication: Mobility status PT Visit Diagnosis: Unsteadiness on feet (R26.81)    Time: [id number] PT Time Calculation (min) (ACUTE ONLY): 16 min   Charges:   PT Evaluation $PT Eval Low Complexity: 1 Low          Gloriann Loan., PT  Office # [phone]   Lorriane Shire 07/26/2022, 8:03 AM

## 2022-07-26 NOTE — Progress Notes (Signed)
PROGRESS NOTE    Maria Oliver  [id number] DOB: [birth year] DOA: 07/24/2022 PCP: Charlott Rakes, MD   Brief Narrative:  The patient is a 62 year old overweight Caucasian female with a past medical history significant for manometry hypertension, COPD, anxiety, tobacco abuse as well as other comorbidities presents with loss of consciousness and syncope.  Patient had upper quadrant abdominal pain with radiation throughout her abdomen and back for the last week or so and also associated burning sensation of her stomach with food causing her to have a low appetite.  She tried to take an antacid which helped her belch and relieve some of her symptoms but she states that she been constipated for several days and took Dulcolax and had 3 "good bowel movements" she has a history of cholecystectomy, colostomy with reversal and lysis of adhesions and she is ambulating outside yesterday when she suddenly felt dizzy and lightheaded.  When she went home and sat down she had loss of consciousness for about 2 minutes.  Regained consciousness and continue to feel dizzy and lightheaded and diaphoretic.  Family ember checked on her and her blood pressure multiple times was low down to the 60s and they decided to bring her to the ED.  In the ED she was noted to have a blood pressure of 88/64 and then improved after fluids.  WBC was 13.3 and hemoglobin 16.5 likely hemoconcentrated.  She had an AKI with a creatinine of 1.63 from her baseline of 0.79.  Head CT was pursued and was negative.  UA was obtained and showed no nitrites, trace leukocytes, moderate hemoglobin, moderate bilirubin and greater than 300 protein.  CT scan of the abdomen and pelvis was done and showed diarrheal state with possible enteritis and a large anterior pelvic wall hernia containing multiple loops of small bowel but no obstruction and normal appendix.  He is also noted to have colonic diverticulosis as well as aortic atherosclerosis.    She is  improving slowly but continues to have some abdominal discomfort and states that she has a lot of flatulence now.  Will check a KUB but AKI has resolved and we will check a GI pathogen panel for gastroenteritis  Assessment and Plan: * Hypotension, improved  -Secondary to decrease oral intake from enteritis in the past week -presenting SBP in 80s. Has improved with fluids -IVF now stopped -Initially Held home antihypertensives but will resume now -Continue to monitor blood pressures per protocol last blood pressure reading was improved and slightly elevated at 146/86   Syncope, Vasovagal -Secondary to hypotension  -Receiving IV fluids as below -Check ECHO and done and pending read -Check Orthostatics and she was not Orthostatic  -Obtain PT/OT to further evaluate and treat and recommending no Follow up given that she was screened and had no needs identified -Checked TSH and was 1.703    GastroEnteritis -Full liquid diet for bowel rest and can advance as tolerated and will go to SOFT this evening; C/w Soft and having a lot of discomfort, bloating, and flatulence  -IV fluid now stopped  -daily IV PPI -PRN IV low dose opioid for pain -PRN antiemetics -Check KUB and start Simethicone 80 po 4 Times Daily PRN -Check GI Pathogen Panel and place on Enteric Precautions   AKI (Acute Kidney Injury) ([place]) -AKI with creatinine of 1.63 from 0.79 secondary to decrease oral intake -BUN/Cr Trend: Recent Labs  Lab 07/24/22 1905 07/25/22 0124 07/26/22 0300  BUN 13 14 8  $ CREATININE 1.63* 1.05* 0.64   -  IVF Hydration with LR at 75 mL/hr now stopped  -Avoid Nephrotoxic Medications, Contrast Dyes, Hypotension and Dehydration to Ensure Adequate Renal Perfusion and will need to Renally Adjust Meds -Continue to Monitor and Trend Renal Function carefully and repeat CMP in the AM    Ventral hernia without obstruction or gangrene -No evidence of obstruction on CT A/P with benign abdominal exam as belly is  soft -Check KUB   COPD (chronic obstructive pulmonary disease) with Emphysema (HCC) -Stable. Continues to use tobacco half pack daily -C/w Home Trelegy substitution with Breo Ellipta 100-25 mcg/act + Incruse Ellipta 62.5 mcg/act 1 puff IH Daily  -SpO2: 94 %  -Encouraged Cessation of Tobacco     Tobacco Abuse -Half pack per day tobacco use.  Encouraged cessation. -Nicotine 14 mg TD patch ordered   Overweight  -Complicates overall prognosis and care -Estimated body mass index is 26.09 kg/m as calculated from the following:   Height as of this encounter: 5' 4"$  (1.626 m).   Weight as of this encounter: 68.9 kg.  -Weight Loss and Dietary Counseling given  DVT prophylaxis: enoxaparin (LOVENOX) injection 40 mg Start: 07/25/22 1000    Code Status: Full Code Family Communication: No family currently at bedside  Disposition Plan:  Level of care: Telemetry Medical Status is: Observation The patient will require care spanning > 2 midnights and should be moved to inpatient because: She continues to feel abdominal discomfort and bloating and will evaluate for GI illness.  Echocardiogram is done and pending for her syncope.  Anticipating discharge in next 24 to 48 hours.   Consultants:  None  Procedures:  ECHOCARDIOGRAM  Antimicrobials:  Anti-infectives (From admission, onward)    None       Subjective: Seen and examined at bedside and states that she was feeling more distended and bloated and states that she was having a lot of flatulence and still did not feel as well.  She denies any chest pain or shortness breath.  No other concerns or complaints at this time.  Objective: Vitals:   07/25/22 2350 07/25/22 2350 07/26/22 0443 07/26/22 0918  BP: (!) 147/80 (!) 147/80 (!) 146/86   Pulse: 66 66 65   Resp:   16 17  Temp: 98.2 F (36.8 C) 98.2 F (36.8 C) 97.8 F (36.6 C) 98.1 F (36.7 C)  TempSrc: Oral Oral Oral Oral  SpO2: 94% 94% 94%   Weight:      Height:         Intake/Output Summary (Last 24 hours) at 07/26/2022 1257 Last data filed at 07/26/2022 [id number] Gross per 24 hour  Intake 1917.94 ml  Output --  Net 1917.94 ml   Filed Weights   07/24/22 1852  Weight: 68.9 kg   Examination: Physical Exam:  Constitutional: WN/WD overweight Caucasian female currently no acute distress Respiratory: Diminished to auscultation bilaterally, no wheezing, rales, rhonchi or crackles. Normal respiratory effort and patient is not tachypenic. No accessory muscle use.  Unlabored breathing Cardiovascular: RRR, no murmurs / rubs / gallops. S1 and S2 auscultated. No extremity edema. Abdomen: Soft, mildly-tender, distended secondary to body habitus and has a large ventral hernia noted that is soft. Bowel sounds positive.  GU: Deferred. Musculoskeletal: No clubbing / cyanosis of digits/nails. No joint deformity upper and lower extremities.  Skin: No rashes, lesions, ulcers on limited skin evaluation. No induration; Warm and dry.  Neurologic: CN 2-12 grossly intact with no focal deficits. Romberg sign and cerebellar reflexes not assessed.  Psychiatric: Normal judgment and insight.  Alert and oriented x 3. Normal mood and appropriate affect.   Data Reviewed: I have personally reviewed following labs and imaging studies  CBC: Recent Labs  Lab 07/24/22 1905 07/25/22 0124 07/26/22 0300  WBC 13.3* 12.1* 8.1  NEUTROABS  --   --  3.3  HGB 16.5* 13.9 13.0  HCT 48.8* 42.5 40.5  MCV 86.7 87.1 88.0  PLT 369 278 [id number]   Basic Metabolic Panel: Recent Labs  Lab 07/24/22 1905 07/25/22 0124 07/26/22 0300  NA 137 138 139  K 4.1 4.0 3.7  CL 100 106 106  CO2 27 21* 23  GLUCOSE 142* 105* 88  BUN 13 14 8  $ CREATININE 1.63* 1.05* 0.64  CALCIUM 10.4* 9.1 8.7*  MG  --   --  1.7  PHOS  --   --  4.2   GFR: Estimated Creatinine Clearance: 68.6 mL/min (by C-G formula based on SCr of 0.64 mg/dL). Liver Function Tests: Recent Labs  Lab 07/24/22 2309 07/26/22 0300  AST 21  18  ALT 19 16  ALKPHOS 61 53  BILITOT 0.4 0.4  PROT 5.9* 5.7*  ALBUMIN 3.4* 3.3*   Recent Labs  Lab 07/24/22 1905  LIPASE 31   No results for input(s): "AMMONIA" in the last 168 hours. Coagulation Profile: No results for input(s): "INR", "PROTIME" in the last 168 hours. Cardiac Enzymes: No results for input(s): "CKTOTAL", "CKMB", "CKMBINDEX", "TROPONINI" in the last 168 hours. BNP (last 3 results) No results for input(s): "PROBNP" in the last 8760 hours. HbA1C: No results for input(s): "HGBA1C" in the last 72 hours. CBG: Recent Labs  Lab 07/24/22 2052  GLUCAP 117*   Lipid Profile: No results for input(s): "CHOL", "HDL", "LDLCALC", "TRIG", "CHOLHDL", "LDLDIRECT" in the last 72 hours. Thyroid Function Tests: Recent Labs    07/26/22 0300  TSH 1.703   Anemia Panel: No results for input(s): "VITAMINB12", "FOLATE", "FERRITIN", "TIBC", "IRON", "RETICCTPCT" in the last 72 hours. Sepsis Labs: No results for input(s): "PROCALCITON", "LATICACIDVEN" in the last 168 hours.  No results found for this or any previous visit (from the past 240 hour(s)).   Radiology Studies: CT ABDOMEN PELVIS W CONTRAST  Result Date: 07/24/2022 CLINICAL DATA:  Concern for bowel obstruction. EXAM: CT ABDOMEN AND PELVIS WITH CONTRAST TECHNIQUE: Multidetector CT imaging of the abdomen and pelvis was performed using the standard protocol following bolus administration of intravenous contrast. RADIATION DOSE REDUCTION: This exam was performed according to the departmental dose-optimization program which includes automated exposure control, adjustment of the mA and/or kV according to patient size and/or use of iterative reconstruction technique. CONTRAST:  64m OMNIPAQUE IOHEXOL 350 MG/ML SOLN COMPARISON:  CT dated 02/22/2021. FINDINGS: Lower chest: The visualized lung bases are clear. No intra-abdominal free air or free fluid. Hepatobiliary: The liver is unremarkable. No biliary dilatation. Cholecystectomy. No  retained calcified stone noted the central CBD. Pancreas: Unremarkable. No pancreatic ductal dilatation or surrounding inflammatory changes. Spleen: Several small splenic hypodense lesions are too small to characterize, possibly cysts or hemangioma. Adrenals/Urinary Tract: The adrenal glands unremarkable. Small left renal cysts. No imaging follow-up. There is no hydronephrosis on either side. There is symmetric enhancement of the kidneys. The visualized ureters appear unremarkable. The urinary bladder is collapsed. Stomach/Bowel: There is loose stool throughout the colon consistent with diarrheal state. Correlation with clinical exam and stool cultures recommended. Several scattered distal colonic diverticula without active inflammatory changes. Mildly enhancing small bowel folds may represent mild enteritis. There is a large anterior pelvic wall hernia containing multiple loops  of small bowel. The neck of the hernia defect measures approximately 5 cm in transverse axial diameter. No bowel obstruction. The appendix is normal. Vascular/Lymphatic: Mild aortoiliac atherosclerotic disease. The IVC is unremarkable. No portal venous gas. There is no adenopathy. Reproductive: The uterus and ovaries are grossly unremarkable. No adnexal masses. Other: None Musculoskeletal: Osteopenia. L3 hemangioma. No acute osseous pathology. IMPRESSION: 1. Diarrheal state with possible enteritis. Correlation with clinical exam and stool cultures recommended. 2. Large anterior pelvic wall hernia containing multiple loops of small bowel. No bowel obstruction. Normal appendix. 3. Colonic diverticulosis. 4.  Aortic Atherosclerosis (ICD10-I70.0). Electronically Signed   By: Anner Crete M.D.   On: 07/24/2022 21:31   CT HEAD WO CONTRAST  Result Date: 07/24/2022 CLINICAL DATA:  Syncopal episode today. EXAM: CT HEAD WITHOUT CONTRAST TECHNIQUE: Contiguous axial images were obtained from the base of the skull through the vertex without  intravenous contrast. RADIATION DOSE REDUCTION: This exam was performed according to the departmental dose-optimization program which includes automated exposure control, adjustment of the mA and/or kV according to patient size and/or use of iterative reconstruction technique. COMPARISON:  05/06/2021. FINDINGS: Brain: No evidence of acute infarction, hemorrhage, hydrocephalus, extra-axial collection or mass lesion/mass effect. Vascular: No hyperdense vessel or unexpected calcification. Skull: Normal. Negative for fracture or focal lesion. Sinuses/Orbits: Globes and orbits are unremarkable. Scattered ethmoid sinus mucosal thickening. Inferior maxillary sinus mucosal thickening. Other: None. IMPRESSION: 1. No acute intracranial abnormalities. Electronically Signed   By: Lajean Manes M.D.   On: 07/24/2022 19:44    Scheduled Meds:  amLODipine  5 mg Oral Daily   enoxaparin (LOVENOX) injection  40 mg Subcutaneous Daily   fluticasone  1 spray Each Nare Daily   fluticasone furoate-vilanterol  1 puff Inhalation Daily   And   umeclidinium bromide  1 puff Inhalation Daily   lisinopril  20 mg Oral Daily   multivitamin with minerals  1 tablet Oral Daily   nicotine  14 mg Transdermal Daily   pantoprazole (PROTONIX) IV  40 mg Intravenous QHS   rosuvastatin  5 mg Oral Daily   Continuous Infusions:   LOS: 0 days   Raiford Noble, DO [facility] Available via Epic secure chat 7am-7pm After these hours, please refer to coverage provider listed on [email or web address] 07/26/2022, 12:57 PM

## 2022-07-26 NOTE — Progress Notes (Signed)
  Echocardiogram 2D Echocardiogram has been performed.  Lana Fish 07/26/2022, 12:19 PM

## 2022-07-26 NOTE — Progress Notes (Signed)
OT Cancellation Note  Patient Details Name: Maria Oliver MRN: [id number] DOB: 06/15/1960   Cancelled Treatment:    Reason Eval/Treat Not Completed: OT screened, no needs identified, will sign off (discussed with PT)  Jorma Tassinari,HILLARY 07/26/2022, 8:31 AM Maurie Boettcher, OT/L   Acute OT Clinical Specialist Acute Rehabilitation Services Pager [phone] Office [phone]

## 2022-07-27 DIAGNOSIS — N179 Acute kidney failure, unspecified: Secondary | ICD-10-CM | POA: Diagnosis not present

## 2022-07-27 DIAGNOSIS — I959 Hypotension, unspecified: Secondary | ICD-10-CM | POA: Diagnosis not present

## 2022-07-27 DIAGNOSIS — J432 Centrilobular emphysema: Secondary | ICD-10-CM | POA: Diagnosis not present

## 2022-07-27 DIAGNOSIS — I9589 Other hypotension: Secondary | ICD-10-CM | POA: Diagnosis not present

## 2022-07-27 DIAGNOSIS — K529 Noninfective gastroenteritis and colitis, unspecified: Secondary | ICD-10-CM | POA: Diagnosis not present

## 2022-07-27 LAB — CBC WITH DIFFERENTIAL/PLATELET
Abs Immature Granulocytes: 0.02 10*3/uL (ref 0.00–0.07)
Basophils Absolute: 0.1 10*3/uL (ref 0.0–0.1)
Basophils Relative: 1 %
Eosinophils Absolute: 0.7 10*3/uL — ABNORMAL HIGH (ref 0.0–0.5)
Eosinophils Relative: 7 %
HCT: 45.8 % (ref 36.0–46.0)
Hemoglobin: 15.1 g/dL — ABNORMAL HIGH (ref 12.0–15.0)
Immature Granulocytes: 0 %
Lymphocytes Relative: 32 %
Lymphs Abs: 3 10*3/uL (ref 0.7–4.0)
MCH: 28.5 pg (ref 26.0–34.0)
MCHC: 33 g/dL (ref 30.0–36.0)
MCV: 86.4 fL (ref 80.0–100.0)
Monocytes Absolute: 0.7 10*3/uL (ref 0.1–1.0)
Monocytes Relative: 7 %
Neutro Abs: 5 10*3/uL (ref 1.7–7.7)
Neutrophils Relative %: 53 %
Platelets: 298 10*3/uL (ref 150–400)
RBC: 5.3 MIL/uL — ABNORMAL HIGH (ref 3.87–5.11)
RDW: 13.6 % (ref 11.5–15.5)
WBC: 9.3 10*3/uL (ref 4.0–10.5)
nRBC: 0 % (ref 0.0–0.2)

## 2022-07-27 LAB — COMPREHENSIVE METABOLIC PANEL
ALT: 18 U/L (ref 0–44)
AST: 17 U/L (ref 15–41)
Albumin: 3.8 g/dL (ref 3.5–5.0)
Alkaline Phosphatase: 63 U/L (ref 38–126)
Anion gap: 11 (ref 5–15)
BUN: 12 mg/dL (ref 8–23)
CO2: 23 mmol/L (ref 22–32)
Calcium: 9.2 mg/dL (ref 8.9–10.3)
Chloride: 104 mmol/L (ref 98–111)
Creatinine, Ser: 0.77 mg/dL (ref 0.44–1.00)
GFR, Estimated: >60 mL/min (ref >60)
Glucose, Bld: 102 mg/dL — ABNORMAL HIGH (ref 70–99)
Potassium: 3.6 mmol/L (ref 3.5–5.1)
Sodium: 138 mmol/L (ref 135–145)
Total Bilirubin: 0.6 mg/dL (ref 0.3–1.2)
Total Protein: 6.5 g/dL (ref 6.5–8.1)

## 2022-07-27 LAB — PHOSPHORUS: Phosphorus: 4.5 mg/dL (ref 2.5–4.6)

## 2022-07-27 LAB — MAGNESIUM: Magnesium: 2 mg/dL (ref 1.7–2.4)

## 2022-07-27 MED ORDER — LIDOCAINE 5 % EX PTCH
1.0000 | MEDICATED_PATCH | CUTANEOUS | Status: DC
  Administered 2022-07-27: 1 via TRANSDERMAL
  Filled 2022-07-27: qty 1

## 2022-07-27 MED ORDER — SIMETHICONE 80 MG PO CHEW: 80.0000 mg | CHEWABLE_TABLET | Freq: Four times a day (QID) | ORAL | 0 refills | Status: DC | PRN

## 2022-07-27 MED ORDER — PANTOPRAZOLE SODIUM 40 MG PO TBEC: 40.0000 mg | DELAYED_RELEASE_TABLET | Freq: Every day | ORAL | 0 refills | Status: DC

## 2022-07-27 NOTE — Progress Notes (Signed)
   07/27/22 1400  Mobility  Activity Ambulated independently in hallway  Level of Assistance Independent  Assistive Device None  Distance Ambulated (ft) 300 ft  Activity Response Tolerated well  Mobility Referral Yes  $Mobility charge 1 Mobility   Mobility Specialist Progress Note  Received pt in bed having no complaints and agreeable to mobility. Pt was asymptomatic throughout ambulation and returned to room w/o fault. Left in bed w/ call bell in reach and all needs met.   Lucious Groves Mobility Specialist  Please contact via SecureChat or Rehab office at [phone]

## 2022-07-27 NOTE — Discharge Summary (Signed)
Physician Discharge Summary   Patient: Maria Oliver MRN: [id number] DOB: September 12, 1960  Admit date:     07/24/2022  Discharge date: 07/27/22  Discharge Physician: Raiford Noble, DO   PCP: Charlott Rakes, MD   Recommendations at discharge:   Follow up with PCP within 1-2 weeks and repeat CMP, CBC, Mag, Phos, within 1 week Follow up on GI Pathogen Panel Continue Low Dose CT Scan as scheduled   Discharge Diagnoses: Principal Problem:   Hypotension Active Problems:   Tobacco abuse   COPD (chronic obstructive pulmonary disease) with emphysema (HCC)   Ventral hernia without obstruction or gangrene   AKI (acute kidney injury) ([place])   Enteritis   Syncope, vasovagal  Resolved Problems:   * No resolved hospital problems. [facility] Course: The patient is a 62 year old overweight Caucasian female with a past medical history significant for manometry hypertension, COPD, anxiety, tobacco abuse as well as other comorbidities presents with loss of consciousness and syncope.  Patient had upper quadrant abdominal pain with radiation throughout her abdomen and back for the last week or so and also associated burning sensation of her stomach with food causing her to have a low appetite.  She tried to take an antacid which helped her belch and relieve some of her symptoms but she states that she been constipated for several days and took Dulcolax and had 3 "good bowel movements" she has a history of cholecystectomy, colostomy with reversal and lysis of adhesions and she is ambulating outside yesterday when she suddenly felt dizzy and lightheaded.  When she went home and sat down she had loss of consciousness for about 2 minutes.  Regained consciousness and continue to feel dizzy and lightheaded and diaphoretic.  Family ember checked on her and her blood pressure multiple times was low down to the 60s and they decided to bring her to the ED.  In the ED she was noted to have a blood pressure of 88/64 and  then improved after fluids.  WBC was 13.3 and hemoglobin 16.5 likely hemoconcentrated.  She had an AKI with a creatinine of 1.63 from her baseline of 0.79.  Head CT was pursued and was negative.  UA was obtained and showed no nitrites, trace leukocytes, moderate hemoglobin, moderate bilirubin and greater than 300 protein.  CT scan of the abdomen and pelvis was done and showed diarrheal state with possible enteritis and a large anterior pelvic wall hernia containing multiple loops of small bowel but no obstruction and normal appendix.  He is also noted to have colonic diverticulosis as well as aortic atherosclerosis.     She is improving slowly but continues to have some abdominal discomfort and states that she has a lot of flatulence now.  Will check a KUB but AKI has resolved and we will check a GI pathogen panel for gastroenteritis but her stool became more formed and she improved.  Likely infectious diarrhea will need to follow-up in the outpatient setting.  KUB was nonobstructive and she is stable for discharge given that her symptoms resolved and blood pressures are stable.  She to follow-up with PCP within 1 to 2 weeks follow-up with gastroenterology in outpatient setting if necessary   Assessment and Plan: * Hypotension, improved significantly  -Secondary to decrease oral intake from enteritis in the past week -presenting SBP in 80s. Has improved with fluids -IVF now stopped -Initially Held home antihypertensives but will resume now -Continue to monitor blood pressures per protocol last blood pressure reading was improved  and slightly elevated at 145/108   Syncope, Vasovagal -Secondary to hypotension  -Receiving IV fluids as below -Check ECHO and showed "Left ventricular ejection fraction, by estimation, is 60 to 65%. The left ventricle has normal function. The left ventricle has no regional wall motion abnormalities. Left ventricular  diastolic parameters were normal. Right ventricular  systolic function is normal. The right ventricular size is normal. Tricuspid regurgitation signal is inadequate for assessing PA pressure. The mitral valve is normal in structure. Trivial mitral valve  regurgitation. No evidence of mitral stenosis. The aortic valve is grossly normal. Aortic valve regurgitation is not visualized. No aortic stenosis is present. The inferior vena cava is normal in size with greater than 50%  respiratory variability, suggesting right atrial pressure of 3 mmHg. " -Check Orthostatics and she was not Orthostatic  -Obtain PT/OT to further evaluate and treat and recommending no Follow up given that she was screened and had no needs identified -Checked TSH and was 1.703  -She is improved and stable for D/C    GastroEnteritis, improving  -Full liquid diet for bowel rest and can advance as tolerated and will go to SOFT this evening; C/w Soft and having a lot of discomfort, bloating, and flatulence  -IV fluid now stopped  -daily IV PPI changed to po  -PRN IV low dose opioid for pain -PRN antiemetics -Check KUB and start Simethicone 80 po 4 Times Daily PRN -Check GI Pathogen Panel and place on Enteric Precautions but this is pending and stool was formed so unlikely infectious. Will need to follow up in  the outpatient setting with PCP  -Continue with supportive care   AKI (Acute Kidney Injury) ([place]) -AKI with creatinine of 1.63 from 0.79 secondary to decrease oral intake -BUN/Cr Trend: Recent Labs  Lab 07/24/22 1905 07/25/22 0124 07/26/22 0300 07/27/22 0411  BUN 13 14 8 12  $ CREATININE 1.63* 1.05* 0.64 0.77  -IVF Hydration with LR at 75 mL/hr now stopped  -Avoid Nephrotoxic Medications, Contrast Dyes, Hypotension and Dehydration to Ensure Adequate Renal Perfusion and will need to Renally Adjust Meds -Continue to Monitor and Trend Renal Function carefully and repeat CMP in the AM    Ventral hernia without obstruction or gangrene -No evidence of obstruction on CT  A/P with benign abdominal exam as belly is soft -Checked KUB and showed "No evidence of bowel obstruction. No radiopaque calculi overlie the kidneys. Right upper quadrant surgical clips."   COPD (chronic obstructive pulmonary disease) with Emphysema (HCC) -Stable. Continues to use tobacco half pack daily -C/w Home Trelegy substitution with Breo Ellipta 100-25 mcg/act + Incruse Ellipta 62.5 mcg/act 1 puff IH Daily while hospitalized and resume home meds at D/C -SpO2: 99 % -Encouraged Cessation of Tobacco     Tobacco Abuse -Half pack per day tobacco use.  Encouraged cessation. -Nicotine 14 mg TD patch ordered while hospitalized    Overweight  -Complicates overall prognosis and care -Estimated body mass index is 26.09 kg/m as calculated from the following:   Height as of this encounter: 5' 4"$  (1.626 m).   Weight as of this encounter: 68.9 kg.  -Weight Loss and Dietary Counseling given  Consultants: None Procedures performed: None  Disposition: Home Diet recommendation:  Discharge Diet Orders (From admission, onward)     Start     Ordered   07/27/22 0000  Diet - low sodium heart healthy        07/27/22 1353           Cardiac diet DISCHARGE  MEDICATION: Allergies as of 07/27/2022       Reactions   Aspirin Nausea Only, Other (See Comments)   GI upset/Hernia    Ciprofloxacin Nausea Only, Other (See Comments)   Tendonitis, joint pain, and nausea.  "Interfered with her HCTZ" (also)   Codeine Nausea And Vomiting, Other (See Comments)   (Also) GI upset/Hernia (CAN TOLERATE DILAUDID & MORPHINE)   Eggs Or Egg-derived Products Nausea Only   Stomach pains   Fentanyl Nausea Only, Other (See Comments)   Tolerates Dilaudid or morphine much better   Hydrocodone Nausea And Vomiting   Lactose Intolerance (gi) Nausea And Vomiting, Other (See Comments)   (Also) vertigo   Oxycodone Nausea And Vomiting   Tolerates hydromorphone better   Penicillin G Rash   Penicillins Rash   Has  patient had a PCN reaction causing immediate rash, facial/tongue/throat swelling, SOB or lightheadedness with hypotension: Yes Has patient had a PCN reaction causing severe rash involving mucus membranes or skin necrosis: No Has patient had a PCN reaction that required hospitalization No Has patient had a PCN reaction occurring within the last 10 years: No If all of the above answers are "NO", then may proceed with Cephalosporin use.   Tape Rash, Other (See Comments)   Please try paper tape        Medication List     STOP taking these medications    cetirizine 10 MG tablet Commonly known as: ZYRTEC   losartan 50 MG tablet Commonly known as: COZAAR   ondansetron 4 MG tablet Commonly known as: ZOFRAN   tiZANidine 4 MG tablet Commonly known as: Zanaflex       TAKE these medications    acetaminophen 500 MG tablet Commonly known as: TYLENOL Take 500-1,000 mg by mouth daily as needed for headache, mild pain or fever.   albuterol 108 (90 Base) MCG/ACT inhaler Commonly known as: VENTOLIN HFA Inhale 2 puffs into the lungs every 6 (six) hours as needed for wheezing or shortness of breath.   amLODipine 5 MG tablet Commonly known as: NORVASC TAKE 1 TABLET(5 MG) BY MOUTH DAILY   cimetidine 200 MG tablet Commonly known as: TAGAMET Take 200 mg by mouth 2 (two) times daily as needed (stomach pain).   fluticasone 50 MCG/ACT nasal spray Commonly known as: FLONASE Place 1 spray into both nostrils daily. What changed:  when to take this reasons to take this   lisinopril 20 MG tablet Commonly known as: ZESTRIL Take 20 mg by mouth daily.   multivitamin with minerals Tabs tablet Take 1 tablet by mouth daily.   nicotine 14 mg/24hr patch Commonly known as: Nicoderm CQ Place 1 patch (14 mg total) onto the skin daily. For 4 weeks then switch to 7 mg per 24 hours thereafter   pantoprazole 40 MG tablet Commonly known as: Protonix Take 1 tablet (40 mg total) by mouth daily.    rosuvastatin 5 MG tablet Commonly known as: CRESTOR TAKE 1 TABLET(5 MG) BY MOUTH DAILY   simethicone 80 MG chewable tablet Commonly known as: MYLICON Chew 1 tablet (80 mg total) by mouth 4 (four) times daily as needed for flatulence.   Trelegy Ellipta 100-62.5-25 MCG/ACT Aepb Generic drug: Fluticasone-Umeclidin-Vilant Inhale 1 puff into the lungs daily.        Discharge Exam: Filed Weights   07/24/22 1852  Weight: 68.9 kg   Vitals:   07/27/22 0440 07/27/22 0907  BP: (!) 148/85 (!) 145/108  Pulse: 68 66  Resp: 16 17  Temp: 98.2  F (36.8 C) 97.9 F (36.6 C)  SpO2: 100% 99%   Examination: Physical Exam:  Constitutional: WN/WD overweight Caucasian female currently no acute distress Respiratory: Diminished to auscultation bilaterally, no wheezing, rales, rhonchi or crackles. Normal respiratory effort and patient is not tachypenic. No accessory muscle use.  Unlabored breathing Cardiovascular: RRR, no murmurs / rubs / gallops. S1 and S2 auscultated. No extremity edema.  Abdomen: Soft, non-tender, distended secondary to body habitus and she has a large ventral hernia noted.  Bowel sounds positive.  GU: Deferred. Musculoskeletal: No clubbing / cyanosis of digits/nails. No joint deformity upper and lower extremities.  Skin: No rashes, lesions, ulcers, skin evaluation. No induration; Warm and dry.  Neurologic: CN 2-12 grossly intact with no focal deficits. Romberg sign and cerebellar reflexes not assessed.  Psychiatric: Normal judgment and insight. Alert and oriented x 3. Normal mood and appropriate affect.   Condition at discharge: stable  The results of significant diagnostics from this hospitalization (including imaging, microbiology, ancillary and laboratory) are listed below for reference.   Imaging Studies: ECHOCARDIOGRAM COMPLETE  Result Date: 07/26/2022    ECHOCARDIOGRAM REPORT   Patient Name:   Maria Oliver Date of Exam: 07/26/2022 Medical Rec #:  [id number]        Height:       64.0 in Accession #:    [id number]      Weight:       152.0 lb Date of Birth:  [birth year]       BSA:          1.741 m Patient Age:    71 years        BP:           146/86 mmHg Patient Gender: F               HR:           62 bpm. Exam Location:  Inpatient Procedure: 2D Echo Indications:    syncope  History:        Patient has prior history of Echocardiogram examinations, most                 recent 02/23/2021. COPD; Risk Factors:Hypertension.  Sonographer:    Harvie Junior Referring Phys: [id number] [facility] LATIF [facility]  Sonographer Comments: Image acquisition challenging due to respiratory motion and Image acquisition challenging due to COPD. IMPRESSIONS  1. Left ventricular ejection fraction, by estimation, is 60 to 65%. The left ventricle has normal function. The left ventricle has no regional wall motion abnormalities. Left ventricular diastolic parameters were normal.  2. Right ventricular systolic function is normal. The right ventricular size is normal. Tricuspid regurgitation signal is inadequate for assessing PA pressure.  3. The mitral valve is normal in structure. Trivial mitral valve regurgitation. No evidence of mitral stenosis.  4. The aortic valve is grossly normal. Aortic valve regurgitation is not visualized. No aortic stenosis is present.  5. The inferior vena cava is normal in size with greater than 50% respiratory variability, suggesting right atrial pressure of 3 mmHg. Comparison(s): No significant change from prior study. Conclusion(s)/Recommendation(s): Normal biventricular function without evidence of hemodynamically significant valvular heart disease. FINDINGS  Left Ventricle: Left ventricular ejection fraction, by estimation, is 60 to 65%. The left ventricle has normal function. The left ventricle has no regional wall motion abnormalities. The left ventricular internal cavity size was normal in size. There is  no left ventricular hypertrophy. Left ventricular diastolic parameters  were normal. Right Ventricle: The right ventricular  size is normal. No increase in right ventricular wall thickness. Right ventricular systolic function is normal. Tricuspid regurgitation signal is inadequate for assessing PA pressure. Left Atrium: Left atrial size was normal in size. Right Atrium: Right atrial size was normal in size. Pericardium: There is no evidence of pericardial effusion. Mitral Valve: The mitral valve is normal in structure. Mild mitral annular calcification. Trivial mitral valve regurgitation. No evidence of mitral valve stenosis. Tricuspid Valve: The tricuspid valve is normal in structure. Tricuspid valve regurgitation is not demonstrated. No evidence of tricuspid stenosis. Aortic Valve: The aortic valve is grossly normal. Aortic valve regurgitation is not visualized. No aortic stenosis is present. Aortic valve mean gradient measures 3.0 mmHg. Aortic valve peak gradient measures 5.6 mmHg. Aortic valve area, by VTI measures 2.80 cm. Pulmonic Valve: The pulmonic valve was not well visualized. Pulmonic valve regurgitation is not visualized. No evidence of pulmonic stenosis. Aorta: The aortic root, ascending aorta, aortic arch and descending aorta are all structurally normal, with no evidence of dilitation or obstruction. Venous: The inferior vena cava is normal in size with greater than 50% respiratory variability, suggesting right atrial pressure of 3 mmHg. IAS/Shunts: No atrial level shunt detected by color flow Doppler.  LEFT VENTRICLE PLAX 2D LVIDd:         3.50 cm     Diastology LVIDs:         2.50 cm     LV e' medial:    10.90 cm/s LV PW:         0.80 cm     LV E/e' medial:  5.9 LV IVS:        0.90 cm     LV e' lateral:   10.40 cm/s LVOT diam:     1.90 cm     LV E/e' lateral: 6.2 LV SV:         78 LV SV Index:   45 LVOT Area:     2.84 cm  LV Volumes (MOD) LV vol d, MOD A2C: 68.6 ml LV vol d, MOD A4C: 71.1 ml LV vol s, MOD A2C: 30.7 ml LV vol s, MOD A4C: 26.5 ml LV SV MOD A2C:     37.9  ml LV SV MOD A4C:     71.1 ml LV SV MOD BP:      40.6 ml RIGHT VENTRICLE RV Basal diam:  3.50 cm RV Mid diam:    2.50 cm RV S prime:     15.20 cm/s TAPSE (M-mode): 2.4 cm LEFT ATRIUM             Index        RIGHT ATRIUM           Index LA diam:        2.60 cm 1.49 cm/m   RA Area:     10.70 cm LA Vol (A2C):   37.8 ml 21.71 ml/m  RA Volume:   22.90 ml  13.15 ml/m LA Vol (A4C):   26.3 ml 15.11 ml/m LA Biplane Vol: 33.4 ml 19.18 ml/m  AORTIC VALVE                    PULMONIC VALVE AV Area (Vmax):    2.91 cm     PV Vmax:       0.78 m/s AV Area (Vmean):   2.84 cm     PV Peak grad:  2.4 mmHg AV Area (VTI):     2.80 cm AV Vmax:  118.00 cm/s AV Vmean:          78.200 cm/s AV VTI:            0.277 m AV Peak Grad:      5.6 mmHg AV Mean Grad:      3.0 mmHg LVOT Vmax:         121.00 cm/s LVOT Vmean:        78.200 cm/s LVOT VTI:          0.274 m LVOT/AV VTI ratio: 0.99  AORTA Ao Root diam: 3.00 cm Ao Asc diam:  2.90 cm MITRAL VALVE MV Area (PHT): 2.99 cm     SHUNTS MV Decel Time: 254 msec     Systemic VTI:  0.27 m MR Peak grad: 26.8 mmHg     Systemic Diam: 1.90 cm MR Vmax:      259.00 cm/s MV E velocity: 64.50 cm/s MV A velocity: 104.50 cm/s MV E/A ratio:  0.62 Buford Dresser MD Electronically signed by Buford Dresser MD Signature Date/Time: 07/26/2022/3:13:11 PM    Final    DG Abd 1 View  Result Date: 07/26/2022 CLINICAL DATA:  [id number] Abdominal distention [id number] EXAM: ABDOMEN - 1 VIEW COMPARISON:  CT 07/24/2022 FINDINGS: No evidence of bowel obstruction. No radiopaque calculi overlie the kidneys. Right upper quadrant surgical clips. IMPRESSION: No evidence of bowel obstruction. Electronically Signed   By: Maurine Simmering M.D.   On: 07/26/2022 14:48   CT ABDOMEN PELVIS W CONTRAST  Result Date: 07/24/2022 CLINICAL DATA:  Concern for bowel obstruction. EXAM: CT ABDOMEN AND PELVIS WITH CONTRAST TECHNIQUE: Multidetector CT imaging of the abdomen and pelvis was performed using the standard protocol  following bolus administration of intravenous contrast. RADIATION DOSE REDUCTION: This exam was performed according to the departmental dose-optimization program which includes automated exposure control, adjustment of the mA and/or kV according to patient size and/or use of iterative reconstruction technique. CONTRAST:  27m OMNIPAQUE IOHEXOL 350 MG/ML SOLN COMPARISON:  CT dated 02/22/2021. FINDINGS: Lower chest: The visualized lung bases are clear. No intra-abdominal free air or free fluid. Hepatobiliary: The liver is unremarkable. No biliary dilatation. Cholecystectomy. No retained calcified stone noted the central CBD. Pancreas: Unremarkable. No pancreatic ductal dilatation or surrounding inflammatory changes. Spleen: Several small splenic hypodense lesions are too small to characterize, possibly cysts or hemangioma. Adrenals/Urinary Tract: The adrenal glands unremarkable. Small left renal cysts. No imaging follow-up. There is no hydronephrosis on either side. There is symmetric enhancement of the kidneys. The visualized ureters appear unremarkable. The urinary bladder is collapsed. Stomach/Bowel: There is loose stool throughout the colon consistent with diarrheal state. Correlation with clinical exam and stool cultures recommended. Several scattered distal colonic diverticula without active inflammatory changes. Mildly enhancing small bowel folds may represent mild enteritis. There is a large anterior pelvic wall hernia containing multiple loops of small bowel. The neck of the hernia defect measures approximately 5 cm in transverse axial diameter. No bowel obstruction. The appendix is normal. Vascular/Lymphatic: Mild aortoiliac atherosclerotic disease. The IVC is unremarkable. No portal venous gas. There is no adenopathy. Reproductive: The uterus and ovaries are grossly unremarkable. No adnexal masses. Other: None Musculoskeletal: Osteopenia. L3 hemangioma. No acute osseous pathology. IMPRESSION: 1. Diarrheal  state with possible enteritis. Correlation with clinical exam and stool cultures recommended. 2. Large anterior pelvic wall hernia containing multiple loops of small bowel. No bowel obstruction. Normal appendix. 3. Colonic diverticulosis. 4.  Aortic Atherosclerosis (ICD10-I70.0). Electronically Signed   By: AAnner CreteM.D.   On:  07/24/2022 21:31   CT HEAD WO CONTRAST  Result Date: 07/24/2022 CLINICAL DATA:  Syncopal episode today. EXAM: CT HEAD WITHOUT CONTRAST TECHNIQUE: Contiguous axial images were obtained from the base of the skull through the vertex without intravenous contrast. RADIATION DOSE REDUCTION: This exam was performed according to the departmental dose-optimization program which includes automated exposure control, adjustment of the mA and/or kV according to patient size and/or use of iterative reconstruction technique. COMPARISON:  05/06/2021. FINDINGS: Brain: No evidence of acute infarction, hemorrhage, hydrocephalus, extra-axial collection or mass lesion/mass effect. Vascular: No hyperdense vessel or unexpected calcification. Skull: Normal. Negative for fracture or focal lesion. Sinuses/Orbits: Globes and orbits are unremarkable. Scattered ethmoid sinus mucosal thickening. Inferior maxillary sinus mucosal thickening. Other: None. IMPRESSION: 1. No acute intracranial abnormalities. Electronically Signed   By: Lajean Manes M.D.   On: 07/24/2022 19:44   MM 3D SCREEN BREAST BILATERAL  Result Date: 07/12/2022 CLINICAL DATA:  Screening. EXAM: DIGITAL SCREENING BILATERAL MAMMOGRAM WITH TOMOSYNTHESIS AND CAD TECHNIQUE: Bilateral screening digital craniocaudal and mediolateral oblique mammograms were obtained. Bilateral screening digital breast tomosynthesis was performed. The images were evaluated with computer-aided detection. COMPARISON:  Previous exam(s). ACR Breast Density Category a: The breast tissue is almost entirely fatty. FINDINGS: There are no findings suspicious for malignancy.  IMPRESSION: No mammographic evidence of malignancy. A result letter of this screening mammogram will be mailed directly to the patient. RECOMMENDATION: Screening mammogram in one year. (Code:[id number]-B-01Y) BI-RADS CATEGORY  1: Negative. Electronically Signed   By: Nolon Nations M.D.   On: 07/12/2022 10:53    Microbiology: Results for orders placed or performed in visit on 03/23/22  Novel Coronavirus, NAA ([facility])     Status: None   Collection Time: 03/23/22 12:00 PM   Specimen: Oropharyngeal(OP) collection in vial transport medium  Result Value Ref Range Status   SARS-CoV-2, NAA Not Detected Not Detected Final    Comment: This nucleic acid amplification test was developed and its performance characteristics determined by [employer]. Nucleic acid amplification tests include RT-PCR and TMA. This test has not been FDA cleared or approved. This test has been authorized by FDA under an Emergency Use Authorization (EUA). This test is only authorized for the duration of time the declaration that circumstances exist justifying the authorization of the emergency use of in vitro diagnostic tests for detection of SARS-CoV-2 virus and/or diagnosis of COVID-19 infection under section 564(b)(1) of the Act, 21 U.S.C. [id number]) (1), unless the authorization is terminated or revoked sooner. When diagnostic testing is negative, the possibility of a false negative result should be considered in the context of a patient's recent exposures and the presence of clinical signs and symptoms consistent with COVID-19. An individual without symptoms of COVID-19 and who is not shedding SARS-CoV-2 virus wo uld expect to have a negative (not detected) result in this assay.   Urine Culture     Status: None   Collection Time: 03/23/22 12:19 PM   Specimen: Urine   UR  Result Value Ref Range Status   Urine Culture, Routine Final report  Final   Organism ID, Bacteria No growth  Final   Labs: CBC: Recent  Labs  Lab 07/24/22 1905 07/25/22 0124 07/26/22 0300 07/27/22 0411  WBC 13.3* 12.1* 8.1 9.3  NEUTROABS  --   --  3.3 5.0  HGB 16.5* 13.9 13.0 15.1*  HCT 48.8* 42.5 40.5 45.8  MCV 86.7 87.1 88.0 86.4  PLT 369 278 253 [id number]   Basic Metabolic Panel: Recent Labs  Lab  07/24/22 1905 07/25/22 0124 07/26/22 0300 07/27/22 0411  NA 137 138 139 138  K 4.1 4.0 3.7 3.6  CL 100 106 106 104  CO2 27 21* 23 23  GLUCOSE 142* 105* 88 102*  BUN 13 14 8 12  $ CREATININE 1.63* 1.05* 0.64 0.77  CALCIUM 10.4* 9.1 8.7* 9.2  MG  --   --  1.7 2.0  PHOS  --   --  4.2 4.5   Liver Function Tests: Recent Labs  Lab 07/24/22 2309 07/26/22 0300 07/27/22 0411  AST 21 18 17  $ ALT 19 16 18  $ ALKPHOS 61 53 63  BILITOT 0.4 0.4 0.6  PROT 5.9* 5.7* 6.5  ALBUMIN 3.4* 3.3* 3.8   CBG: Recent Labs  Lab 07/24/22 2052  GLUCAP 117*   Discharge time spent: greater than 30 minutes.  Signed: Raiford Noble, DO [facility] 07/27/2022

## 2022-07-27 NOTE — TOC CM/SW Note (Signed)
  Transition of Care [facility]) Screening Note   Patient Details  Name: Maria Oliver Date of Birth: 10/28/1960   Transition of Care Department [facility]) has reviewed patient and no TOC needs have been identified at this time. We will continue to monitor patient advancement through interdisciplinary progression rounds. If new patient transition needs arise, please place a TOC consult.

## 2022-07-27 NOTE — Plan of Care (Signed)

## 2022-07-27 NOTE — Progress Notes (Signed)
Patient discharged home, self-care.  PIV removed. AVS discussed, pt verbalized understanding. Belongings and cell phone with pt.  Patient left the unit in NAD.

## 2022-07-28 ENCOUNTER — Telehealth: Payer: Self-pay

## 2022-07-28 NOTE — Telephone Encounter (Signed)
Transition Care Management Follow-up Telephone Call Date of discharge and from where: 07/27/2022, [facility] How have you been since you were released from the hospital? She said she is still weak and shaky but doing better.  She was at [facility] and said she would call me back when she gets home

## 2022-07-30 LAB — GASTROINTESTINAL PANEL BY PCR, STOOL (REPLACES STOOL CULTURE)

## 2022-08-18 ENCOUNTER — Ambulatory Visit: Payer: Medicare Other | Admitting: Family Medicine

## 2022-09-03 ENCOUNTER — Other Ambulatory Visit: Payer: Self-pay | Admitting: Family Medicine

## 2022-09-03 DIAGNOSIS — I10 Essential (primary) hypertension: Secondary | ICD-10-CM

## 2022-09-03 NOTE — Telephone Encounter (Signed)
Medication Refill - Medication: pantoprazole (PROTONIX) 40 MG tablet  Has the patient contacted their pharmacy? Yes.   Pt stated it was prescribed when she was in the hospital so she was told to contact her doctor  Preferred Pharmacy (with phone number or street name):  [facility] [place], [place] [place] Phone: [phone]  Fax: [phone]     Has the patient been seen for an appointment in the last year OR does the patient have an upcoming appointment? Yes.    Agent: Please be advised that RX refills may take up to 3 business days. We ask that you follow-up with your pharmacy.

## 2022-09-06 MED ORDER — PANTOPRAZOLE SODIUM 40 MG PO TBEC: 40.0000 mg | DELAYED_RELEASE_TABLET | Freq: Every day | ORAL | 0 refills | Status: DC

## 2022-09-06 NOTE — Telephone Encounter (Signed)
Requested medication (s) are due for refill today: yes  Requested medication (s) are on the active medication list: yes  Last refill:  07/27/22  Future visit scheduled: yes  Notes to clinic:  Unable to refill per protocol, last refill by another provider.  Last refill in the ED, routing for PCP to approval.     Requested Prescriptions  Pending Prescriptions Disp Refills   pantoprazole (PROTONIX) 40 MG tablet 30 tablet 0    Sig: Take 1 tablet (40 mg total) by mouth daily.     Gastroenterology: Proton Pump Inhibitors Passed - 09/03/2022  3:28 PM      Passed - Valid encounter within last 12 months    Recent Outpatient Visits           3 months ago Herpes zoster without complication   [place], [place]   3 months ago Encounter for [employer] annual wellness exam   [place], [place]   3 months ago Essential hypertension   [place], Jarome [place]atin, RPH-CPP   5 months ago Urinary symptom or sign   [place], [place], MD   7 months ago Essential hypertension   [place], [place]ong, MD       Future Appointments             In 2 months Charlott Rakes, MD [place]

## 2022-09-07 ENCOUNTER — Other Ambulatory Visit: Payer: Self-pay

## 2022-09-07 ENCOUNTER — Ambulatory Visit: Payer: Self-pay

## 2022-09-07 ENCOUNTER — Emergency Department (HOSPITAL_COMMUNITY)
Admission: EM | Admit: 2022-09-07 | Discharge: 2022-09-07 | Disposition: A | Discharge status: Home / Self Care | Payer: 59 | Attending: Emergency Medicine | Admitting: Emergency Medicine

## 2022-09-07 ENCOUNTER — Encounter (HOSPITAL_COMMUNITY): Payer: Self-pay

## 2022-09-07 ENCOUNTER — Emergency Department (HOSPITAL_COMMUNITY): Payer: 59

## 2022-09-07 DIAGNOSIS — F17210 Nicotine dependence, cigarettes, uncomplicated: Secondary | ICD-10-CM | POA: Insufficient documentation

## 2022-09-07 DIAGNOSIS — R0789 Other chest pain: Secondary | ICD-10-CM | POA: Insufficient documentation

## 2022-09-07 DIAGNOSIS — R0602 Shortness of breath: Secondary | ICD-10-CM | POA: Diagnosis not present

## 2022-09-07 DIAGNOSIS — R079 Chest pain, unspecified: Secondary | ICD-10-CM

## 2022-09-07 LAB — CBC
HCT: 47.1 % — ABNORMAL HIGH (ref 36.0–46.0)
Hemoglobin: 15.3 g/dL — ABNORMAL HIGH (ref 12.0–15.0)
MCH: 28.3 pg (ref 26.0–34.0)
MCHC: 32.5 g/dL (ref 30.0–36.0)
MCV: 87.2 fL (ref 80.0–100.0)
Platelets: 352 10*3/uL (ref 150–400)
RBC: 5.4 MIL/uL — ABNORMAL HIGH (ref 3.87–5.11)
RDW: 13.9 % (ref 11.5–15.5)
WBC: 9.2 10*3/uL (ref 4.0–10.5)
nRBC: 0 % (ref 0.0–0.2)

## 2022-09-07 LAB — BASIC METABOLIC PANEL
Anion gap: 11 (ref 5–15)
BUN: 5 mg/dL — ABNORMAL LOW (ref 8–23)
CO2: 29 mmol/L (ref 22–32)
Calcium: 9.4 mg/dL (ref 8.9–10.3)
Chloride: 100 mmol/L (ref 98–111)
Creatinine, Ser: 0.69 mg/dL (ref 0.44–1.00)
GFR, Estimated: >60 mL/min (ref >60)
Glucose, Bld: 118 mg/dL — ABNORMAL HIGH (ref 70–99)
Potassium: 3.8 mmol/L (ref 3.5–5.1)
Sodium: 140 mmol/L (ref 135–145)

## 2022-09-07 LAB — TROPONIN I (HIGH SENSITIVITY): Troponin I (High Sensitivity): 6 ng/L (ref <18)

## 2022-09-07 MED ORDER — KETOROLAC TROMETHAMINE 30 MG/ML IJ SOLN
30.0000 mg | Freq: Once | INTRAMUSCULAR | Status: AC
  Administered 2022-09-07: 30 mg via INTRAVENOUS
  Filled 2022-09-07: qty 1

## 2022-09-07 MED ORDER — IBUPROFEN 400 MG PO TABS: 400.0000 mg | ORAL_TABLET | Freq: Three times a day (TID) | ORAL | 0 refills | Status: DC | PRN

## 2022-09-07 MED ORDER — IOHEXOL 350 MG/ML SOLN
50.0000 mL | Freq: Once | INTRAVENOUS | Status: AC | PRN
  Administered 2022-09-07: 50 mL via INTRAVENOUS

## 2022-09-07 NOTE — Telephone Encounter (Signed)
  Chief Complaint: Chest pain, arm pain, difficulty moving neck Symptoms: above + dizziness and fatigue Frequency: 3 days Pertinent Negatives: Patient denies fever Disposition: [x] ED /[] Urgent Care (no appt availability in office) / [] Appointment(In office/virtual)/ []  [place] [facility]/ [] Home Care/ [] Refused Recommended Disposition /[] [place] [facility]/ []  Follow-up with PCP Additional Notes: PT reports chest pain under breasts, pain is more on the right side. Pain goes into neck and both arms. Pt states it is painful to mover her neck. Pt thinks it may be a lung infection. PT will go to ED for care.     Reason for Disposition  Pain also in shoulder(s) or arm(s) or jaw  (Exception: Pain is clearly made worse by movement.)  Answer Assessment - Initial Assessment Questions 1. LOCATION: "Where does it hurt?"       Under breast and rib cage - right side more - can't turn head, arm pain 2. RADIATION: "Does the pain go anywhere else?" (e.g., into neck, jaw, arms, back)     Arms and back 3. ONSET: "When did the chest pain begin?" (Minutes, hours or days)      3 days ago 4. PATTERN: "Does the pain come and go, or has it been constant since it started?"  "Does it get worse with exertion?"      Constant 5. DURATION: "How long does it last" (e.g., seconds, minutes, hours)     ongoing 6. SEVERITY: "How bad is the pain?"  (e.g., Scale 1-10; mild, moderate, or severe)    - MILD (1-3): doesn't interfere with normal activities     - MODERATE (4-7): interferes with normal activities or awakens from sleep    - SEVERE (8-10): excruciating pain, unable to do any normal activities       6/10 7. CARDIAC RISK FACTORS: "Do you have any history of heart problems or risk factors for heart disease?" (e.g., angina, prior heart attack; diabetes, high blood pressure, high cholesterol, smoker, or strong family history of heart disease)     Not known 8. PULMONARY RISK FACTORS: "Do you have any history of  lung disease?"  (e.g., blood clots in lung, asthma, emphysema, birth control pills)     COPD 9. CAUSE: "What do you think is causing the chest pain?"     Lung infection 10. OTHER SYMPTOMS: "Do you have any other symptoms?" (e.g., dizziness, nausea, vomiting, sweating, fever, difficulty breathing, cough)       Fatigue, dizziness,  Protocols used: Chest Pain-A-AH

## 2022-09-07 NOTE — Telephone Encounter (Signed)
Noted  

## 2022-09-07 NOTE — ED Triage Notes (Signed)
Pt c/o right sided chest pain that radiates into right upper back, SOB started yesterday and got worse today. Pt c/o dizzinessx2d. Pt denies N/V

## 2022-09-07 NOTE — ED Provider Notes (Signed)
[place] Provider Note   CSN: [id number] Arrival date & time: 09/07/22  1900     History {Add pertinent medical, surgical, social history, OB history to HPI:1} Chief Complaint  Patient presents with   Chest Pain   Shortness of Breath    Maria Oliver is a 62 y.o. female presented to ED with chest pain and shortness of breath.  Patient reports onset of symptoms 2 days ago.  She is having pain tracking along under her clavicles in her mid chest and also feels it in her back.  It is worse with deep inspiration.  She has a chronic smoker's cough and does smoke cigarettes.  She denies nausea, vomiting, diarrhea.  She reports that she was scheduled to have a repeat CT scan of her chest as an annual screening for pulmonary nodule that was noted on CT scan 1 year ago in March 2023.  Her CT scan is scheduled for next week.  Separately she reports she was struck in the left chest by a softball 4 days ago but was not having any pain immediately after the accident.  HPI     Home Medications Prior to Admission medications   Medication Sig Start Date End Date Taking? Authorizing Provider  acetaminophen (TYLENOL) 500 MG tablet Take 500-1,000 mg by mouth daily as needed for headache, mild pain or fever.    [provider]  albuterol (VENTOLIN HFA) 108 (90 Base) MCG/ACT inhaler Inhale 2 puffs into the lungs every 6 (six) hours as needed for wheezing or shortness of breath. 12/08/21   Charlott Rakes, MD  amLODipine (NORVASC) 5 MG tablet TAKE 1 TABLET(5 MG) BY MOUTH DAILY 09/03/22   Charlott Rakes, MD  cimetidine (TAGAMET) 200 MG tablet Take 200 mg by mouth 2 (two) times daily as needed (stomach pain).    [provider]  fluticasone (FLONASE) 50 MCG/ACT nasal spray Place 1 spray into both nostrils daily. Patient taking differently: Place 1 spray into both nostrils daily as needed for allergies. 09/16/21   Charlott Rakes, MD   Fluticasone-Umeclidin-Vilant (TRELEGY ELLIPTA) 100-62.5-25 MCG/ACT AEPB Inhale 1 puff into the lungs daily. 03/23/22   Charlott Rakes, MD  lisinopril (ZESTRIL) 20 MG tablet TAKE 1 TABLET(20 MG) BY MOUTH DAILY 09/03/22   Charlott Rakes, MD  Multiple Vitamin (MULTIVITAMIN WITH MINERALS) TABS tablet Take 1 tablet by mouth daily.    [provider]  nicotine (NICODERM CQ) 14 mg/24hr patch Place 1 patch (14 mg total) onto the skin daily. For 4 weeks then switch to 7 mg per 24 hours thereafter Patient not taking: Reported on 05/12/2022 08/25/21   Charlott Rakes, MD  pantoprazole (PROTONIX) 40 MG tablet Take 1 tablet (40 mg total) by mouth daily. 09/06/22   Charlott Rakes, MD  rosuvastatin (CRESTOR) 5 MG tablet TAKE 1 TABLET(5 MG) BY MOUTH DAILY 07/08/22   Charlott Rakes, MD  simethicone (MYLICON) 80 MG chewable tablet Chew 1 tablet (80 mg total) by mouth 4 (four) times daily as needed for flatulence. 07/27/22   Raiford Noble Latif, DO      Allergies    Aspirin, Ciprofloxacin, Codeine, Egg-derived products, Fentanyl, Hydrocodone, Lactose intolerance (gi), Oxycodone, Penicillin g, Penicillins, and Tape    Review of Systems   Review of Systems  Physical Exam Updated Vital Signs BP 128/78   Pulse 71   Temp 97.9 F (36.6 C) (Oral)   Resp 18   Ht 5\' 4"  (1.626 m)   Wt 68.9 kg  SpO2 98%   BMI 26.07 kg/m  Physical Exam Constitutional:      General: She is not in acute distress. HENT:     Head: Normocephalic and atraumatic.  Eyes:     Conjunctiva/sclera: Conjunctivae normal.     Pupils: Pupils are equal, round, and reactive to light.  Cardiovascular:     Rate and Rhythm: Normal rate and regular rhythm.  Pulmonary:     Effort: Pulmonary effort is normal. No respiratory distress.  Chest:     Comments: Tenderness of the parasternal region in the mid chest wall, also along the clavicles, worse with repositioning Abdominal:     General: There is no distension.     Tenderness: There  is no abdominal tenderness.  Skin:    General: Skin is warm and dry.  Neurological:     General: No focal deficit present.     Mental Status: She is alert. Mental status is at baseline.  Psychiatric:        Mood and Affect: Mood normal.        Behavior: Behavior normal.     ED Results / Procedures / Treatments   Labs (all labs ordered are listed, but only abnormal results are displayed) Labs Reviewed  CBC - Abnormal; Notable for the following components:      Result Value   RBC 5.40 (*)    Hemoglobin 15.3 (*)    HCT 47.1 (*)    All other components within normal limits  BASIC METABOLIC PANEL  TROPONIN I (HIGH SENSITIVITY)    EKG None  Radiology DG Chest 2 View  Result Date: 09/07/2022 CLINICAL DATA:  Chest pain. EXAM: CHEST - 2 VIEW COMPARISON:  May 06, 2021. FINDINGS: The heart size and mediastinal contours are within normal limits. Both lungs are clear. The visualized skeletal structures are unremarkable. IMPRESSION: No active cardiopulmonary disease. Electronically Signed   By: Marijo Conception M.D.   On: 09/07/2022 19:46    Procedures Procedures  {Document cardiac monitor, telemetry assessment procedure when appropriate:1}  Medications Ordered in ED Medications - No data to display  ED Course/ Medical Decision Making/ A&P   {   Click here for ABCD2, HEART and other calculatorsREFRESH Note before signing :1}                          Medical Decision Making Amount and/or Complexity of Data Reviewed Labs: ordered. Radiology: ordered.   This patient presents to the ED with concern for pain. This involves an extensive number of treatment options, and is a complaint that carries with it a high risk of complications and morbidity.  The differential diagnosis includes chest wall to her nurse versus lymphadenopathy versus reactive lymphadenitis versus pneumonia versus pleural effusion versus other  External records from outside source obtained and reviewed  including CT scan from March 2023 showing a pulmonary nodule in the right upper lung  I ordered and personally interpreted labs.  The pertinent results include:  ***  I ordered imaging studies including x-ray of chest, CT scan of the chest with contrast I independently visualized and interpreted imaging which showed *** I agree with the radiologist interpretation  The patient was maintained on a cardiac monitor.  I personally viewed and interpreted the cardiac monitored which showed an underlying rhythm of: Sinus rhythm  Per my interpretation the patient's ECG shows no acute ischemic findings  I ordered medication including ***  for *** I have reviewed the  patients home medicines and have made adjustments as needed  Test Considered: ***  I requested consultation with the ***,  and discussed lab and imaging findings as well as pertinent plan - they recommend: ***  After the interventions noted above, I reevaluated the patient and found that they have: {resolved/improved/worsened:23923::"improved"}  Social Determinants of Health:***  Dispostion:  After consideration of the diagnostic results and the patients response to treatment, I feel that the patent would benefit from ***.   {Document critical care time when appropriate:1} {Document review of labs and clinical decision tools ie heart score, Chads2Vasc2 etc:1}  {Document your independent review of radiology images, and any outside records:1} {Document your discussion with family members, caretakers, and with consultants:1} {Document social determinants of health affecting pt's care:1} {Document your decision making why or why not admission, treatments were needed:1} Final Clinical Impression(s) / ED Diagnoses Final diagnoses:  None    Rx / DC Orders ED Discharge Orders     None

## 2022-09-13 ENCOUNTER — Ambulatory Visit (HOSPITAL_COMMUNITY): Payer: 59

## 2022-09-25 ENCOUNTER — Other Ambulatory Visit: Payer: Self-pay | Admitting: Family Medicine

## 2022-10-11 ENCOUNTER — Encounter: Payer: Self-pay | Admitting: Family Medicine

## 2022-10-11 ENCOUNTER — Ambulatory Visit: Payer: 59 | Attending: Family Medicine | Admitting: Family Medicine

## 2022-10-11 VITALS — BP 144/88 | HR 73 | Temp 98.0°F | Ht 64.0 in | Wt 150.2 lb

## 2022-10-11 DIAGNOSIS — F17210 Nicotine dependence, cigarettes, uncomplicated: Secondary | ICD-10-CM | POA: Diagnosis not present

## 2022-10-11 DIAGNOSIS — Z1211 Encounter for screening for malignant neoplasm of colon: Secondary | ICD-10-CM

## 2022-10-11 DIAGNOSIS — I10 Essential (primary) hypertension: Secondary | ICD-10-CM | POA: Diagnosis not present

## 2022-10-11 DIAGNOSIS — E7849 Other hyperlipidemia: Secondary | ICD-10-CM | POA: Diagnosis not present

## 2022-10-11 MED ORDER — PANTOPRAZOLE SODIUM 40 MG PO TBEC: 40.0000 mg | DELAYED_RELEASE_TABLET | Freq: Every day | ORAL | 1 refills | Status: DC

## 2022-10-11 MED ORDER — LISINOPRIL 20 MG PO TABS: ORAL_TABLET | ORAL | 1 refills | Status: DC

## 2022-10-11 NOTE — Progress Notes (Signed)
Stopped taking Amlodipine

## 2022-10-11 NOTE — Progress Notes (Signed)
Subjective:  Patient ID: Maria Oliver, female    DOB: June 21, 1960  Age: 62 y.o. MRN: [id number]  CC: Hypertension   HPI Maria Oliver is a 62 y.o. year old female with a history of COPD, hypertension, tobacco abuse (smoking since elementary school).   Interval History:  BP at home have ranged 140-160. She stopped. She stopped Amlodipine as she states it made her sick and caused her BP to drop. She was hospitalized for Dehydration with resulting hypotension, AKI brief loss of consciousness in 07/2022.  CT abdomen had revealed diarrheal state with possible enteritis.  She said Amlodipine made her drowsy and she also stopped Crestor as she was unsure what was making her drowsy.  She only takes lisinopril for hypertension. She continues to smoke and is not ready to quit.  Her children and grandkids stressed her out and this causes her to smoke more. Denies presence of additional concerns.  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 09/09/2016   Generalized headaches    Hernia, inguinal, right    Hypertension    Leg swelling     Past Surgical History:  Procedure Laterality Date   COLON SURGERY  2010   per patient "colon take down"   COLOSTOMY  2010   LAPAROSCOPIC CHOLECYSTECTOMY SINGLE SITE WITH INTRAOPERATIVE CHOLANGIOGRAM N/A 09/09/2016   Procedure: LAPAROSCOPIC CHOLECYSTECTOMY  WITH INTRAOPERATIVE CHOLANGIOGRAM;  Surgeon: Karie Soda, MD;  Location: [facility];  Service: General;  Laterality: N/A;   TUBAL LIGATION      Family History  Problem Relation Age of Onset   Heart disease Mother 59   Heart failure Mother    Asthma Mother    Emphysema Mother     Alzheimer's disease Father 97   Emphysema Brother    Asthma Brother    Colon cancer Neg Hx     Social History   Socioeconomic History   Marital status: Widowed    Spouse name: Not on file   Number of children: 3   Years of education: Not on file   Highest education level: Not on file  Occupational History   Occupation: disabled  Tobacco Use   Smoking status: Every Day    Packs/day: 1.00    Years: 47.00    Additional pack years: 0.00    Total pack years: 47.00    Types: Cigarettes   Smokeless tobacco: Never   Tobacco comments:    1/2 ppd 02/25/20   Vaping Use   Vaping Use: Never used  Substance and Sexual Activity   Alcohol use: No    Alcohol/week: 0.0 standard drinks of alcohol   Drug use: No   Sexual activity: Not Currently    Birth control/protection: Surgical, None  Other Topics Concern   Not on file  Social History Narrative   Not on file   Social Determinants of Health   Financial Resource Strain: Low Risk  (05/12/2022)   Overall Financial Resource Strain (CARDIA)    Difficulty of Paying Living Expenses: Not hard at all  Food Insecurity: No Food Insecurity (07/25/2022)   Hunger Vital Sign    Worried About Programme researcher, broadcasting/film/video in  the Last Year: Never true    Ran Out of Food in the Last Year: Never true  Transportation Needs: No Transportation Needs (07/25/2022)   PRAPARE - Administrator, Civil Service (Medical): No    Lack of Transportation (Non-Medical): No  Physical Activity: Inactive (05/12/2022)   Exercise Vital Sign    Days of Exercise per Week: 0 days    Minutes of Exercise per Session: 0 min  Stress: No Stress Concern Present (05/12/2022)   [employer] of Occupational Health - Occupational Stress Questionnaire    Feeling of Stress : Not at all  Social Connections: Socially Isolated (05/12/2022)   Social Connection and Isolation Panel [NHANES]    Frequency of Communication with Friends and Family: More than three times a week     Frequency of Social Gatherings with Friends and Family: More than three times a week    Attends Religious Services: Never    Database administrator or Organizations: No    Attends Banker Meetings: Never    Marital Status: Widowed    Allergies  Allergen Reactions   Aspirin Nausea Only and Other (See Comments)    GI upset/Hernia     Ciprofloxacin Nausea Only and Other (See Comments)    Tendonitis, joint pain, and nausea.  "Interfered with her HCTZ" (also)   Codeine Nausea And Vomiting and Other (See Comments)    (Also) GI upset/Hernia (CAN TOLERATE DILAUDID & MORPHINE)   Egg-Derived Products Nausea Only    Stomach pains   Fentanyl Nausea Only and Other (See Comments)    Tolerates Dilaudid or morphine much better   Hydrocodone Nausea And Vomiting   Lactose Intolerance (Gi) Nausea And Vomiting and Other (See Comments)    (Also) vertigo   Oxycodone Nausea And Vomiting    Tolerates hydromorphone better   Penicillin G Rash   Penicillins Rash    Has patient had a PCN reaction causing immediate rash, facial/tongue/throat swelling, SOB or lightheadedness with hypotension: Yes Has patient had a PCN reaction causing severe rash involving mucus membranes or skin necrosis: No Has patient had a PCN reaction that required hospitalization No Has patient had a PCN reaction occurring within the last 10 years: No If all of the above answers are "NO", then may proceed with Cephalosporin use.    Tape Rash and Other (See Comments)    Please try paper tape    Outpatient Medications Prior to Visit  Medication Sig Dispense Refill   albuterol (VENTOLIN HFA) 108 (90 Base) MCG/ACT inhaler Inhale 2 puffs into the lungs every 6 (six) hours as needed for wheezing or shortness of breath. 25.5 g 1   Fluticasone-Umeclidin-Vilant (TRELEGY ELLIPTA) 100-62.5-25 MCG/ACT AEPB Inhale 1 puff into the lungs daily. 1 each 3   ibuprofen (ADVIL) 400 MG tablet Take 1 tablet (400 mg total) by mouth every 8  (eight) hours as needed for up to 21 doses for mild pain or moderate pain. Take with food 21 tablet 0   Multiple Vitamin (MULTIVITAMIN WITH MINERALS) TABS tablet Take 1 tablet by mouth daily.     fluticasone (FLONASE) 50 MCG/ACT nasal spray Place 1 spray into both nostrils daily. (Patient taking differently: Place 1 spray into both nostrils daily as needed for allergies.) 15.8 mL 2   lisinopril (ZESTRIL) 20 MG tablet TAKE 1 TABLET(20 MG) BY MOUTH DAILY 90 tablet 0   acetaminophen (TYLENOL) 500 MG tablet Take 500-1,000 mg by mouth daily as needed for headache, mild pain or  fever.     cimetidine (TAGAMET) 200 MG tablet Take 200 mg by mouth 2 (two) times daily as needed (stomach pain). (Patient not taking: Reported on 10/11/2022)     nicotine (NICODERM CQ) 14 mg/24hr patch Place 1 patch (14 mg total) onto the skin daily. For 4 weeks then switch to 7 mg per 24 hours thereafter (Patient not taking: Reported on 05/12/2022) 28 patch 3   rosuvastatin (CRESTOR) 5 MG tablet TAKE 1 TABLET(5 MG) BY MOUTH DAILY (Patient not taking: Reported on 10/11/2022) 90 tablet 1   amLODipine (NORVASC) 5 MG tablet TAKE 1 TABLET(5 MG) BY MOUTH DAILY (Patient not taking: Reported on 10/11/2022) 90 tablet 0   pantoprazole (PROTONIX) 40 MG tablet Take 1 tablet (40 mg total) by mouth daily. (Patient not taking: Reported on 10/11/2022) 90 tablet 0   simethicone (MYLICON) 80 MG chewable tablet Chew 1 tablet (80 mg total) by mouth 4 (four) times daily as needed for flatulence. (Patient not taking: Reported on 10/11/2022) 30 tablet 0   No facility-administered medications prior to visit.     ROS Review of Systems  Constitutional:  Negative for activity change and appetite change.  HENT:  Negative for sinus pressure and sore throat.   Respiratory:  Negative for chest tightness, shortness of breath and wheezing.   Cardiovascular:  Negative for chest pain and palpitations.  Gastrointestinal:  Negative for abdominal distention, abdominal  pain and constipation.  Genitourinary: Negative.   Musculoskeletal: Negative.   Psychiatric/Behavioral:  Negative for behavioral problems and dysphoric mood.     Objective:  BP (!) 144/88   Pulse 73   Temp 98 F (36.7 C) (Oral)   Ht 5\' 4"  (1.626 m)   Wt 150 lb 3.2 oz (68.1 kg)   SpO2 95%   BMI 25.78 kg/m      10/11/2022   10:00 AM 10/11/2022    9:09 AM 09/07/2022   11:30 PM  BP/Weight  Systolic BP 144 170 129  Diastolic BP 88 100 89  Wt. (Lbs)  150.2   BMI  25.78 kg/m2       Physical Exam Constitutional:      Appearance: She is well-developed.  Cardiovascular:     Rate and Rhythm: Normal rate.     Heart sounds: Normal heart sounds. No murmur heard. Pulmonary:     Effort: Pulmonary effort is normal.     Breath sounds: Normal breath sounds. No wheezing or rales.  Chest:     Chest wall: No tenderness.  Abdominal:     General: Bowel sounds are normal. There is no distension.     Palpations: Abdomen is soft. There is no mass.     Tenderness: There is no abdominal tenderness.     Hernia: A hernia is present.  Musculoskeletal:        General: Normal range of motion.     Right lower leg: No edema.     Left lower leg: No edema.  Neurological:     Mental Status: She is alert and oriented to person, place, and time.  Psychiatric:        Mood and Affect: Mood normal.        Latest Ref Rng & Units 09/07/2022    7:25 PM 07/27/2022    4:11 AM 07/26/2022    3:00 AM  CMP  Glucose 70 - 99 mg/dL 161  096  88   BUN 8 - 23 mg/dL 5  12  8    Creatinine 0.44 - 1.00  mg/dL 5.40  9.81  1.91   Sodium 135 - 145 mmol/L 140  138  139   Potassium 3.5 - 5.1 mmol/L 3.8  3.6  3.7   Chloride 98 - 111 mmol/L 100  104  106   CO2 22 - 32 mmol/L 29  23  23    Calcium 8.9 - 10.3 mg/dL 9.4  9.2  8.7   Total Protein 6.5 - 8.1 g/dL  6.5  5.7   Total Bilirubin 0.3 - 1.2 mg/dL  0.6  0.4   Alkaline Phos 38 - 126 U/L  63  53   AST 15 - 41 U/L  17  18   ALT 0 - 44 U/L  18  16     Lipid Panel      Component Value Date/Time   CHOL 219 (H) 08/25/2021 1436   TRIG 105 08/25/2021 1436   HDL 55 08/25/2021 1436   CHOLHDL 3.5 06/28/2017 1157   CHOLHDL 4.7 05/09/2013 1445   VLDL 32 05/09/2013 1445   LDLCALC 145 (H) 08/25/2021 1436    CBC    Component Value Date/Time   WBC 9.2 09/07/2022 1925   RBC 5.40 (H) 09/07/2022 1925   HGB 15.3 (H) 09/07/2022 1925   HGB 15.0 05/12/2022 1549   HCT 47.1 (H) 09/07/2022 1925   HCT 47.7 (H) 05/12/2022 1549   PLT 352 09/07/2022 1925   PLT 393 05/12/2022 1549   MCV 87.2 09/07/2022 1925   MCV 88 05/12/2022 1549   MCH 28.3 09/07/2022 1925   MCHC 32.5 09/07/2022 1925   RDW 13.9 09/07/2022 1925   RDW 13.2 05/12/2022 1549   LYMPHSABS 3.0 07/27/2022 0411   LYMPHSABS 3.7 (H) 05/12/2022 1549   MONOABS 0.7 07/27/2022 0411   EOSABS 0.7 (H) 07/27/2022 0411   EOSABS 0.6 (H) 05/12/2022 1549   BASOSABS 0.1 07/27/2022 0411   BASOSABS 0.1 05/12/2022 1549    Lab Results  Component Value Date   HGBA1C 5.6 12/10/2020   The 10-year ASCVD risk score (Arnett DK, et al., 2019) is: 16.3%   Values used to calculate the score:     Age: 31 years     Sex: Female     Is Non-Hispanic African American: No     Diabetic: No     Tobacco smoker: Yes     Systolic Blood Pressure: 144 mmHg     Is BP treated: Yes     HDL Cholesterol: 55 mg/dL     Total Cholesterol: 219 mg/dL   Assessment & Plan:  1. Essential hypertension Slightly above goal She is reluctant to taking an additional antihypertensive even though I have explained to her that her hypotension which led to hospitalization was due to GI losses Continue lisinopril Counseled on blood pressure goal of less than 130/80, low-sodium, DASH diet, medication compliance, 150 minutes of moderate intensity exercise per week. Discussed medication compliance, adverse effects. - lisinopril (ZESTRIL) 20 MG tablet; TAKE 1 TABLET(20 MG) BY MOUTH DAILY  Dispense: 90 tablet; Refill: 1  2. Smoking greater than 20 pack  years Counseled on smoking cessation and she is not ready to quit.  3. Screening for colon cancer - Ambulatory referral to Gastroenterology  4. Other hyperlipidemia 10-year ASCVD risk score is 22% and I have discussed this with her Strongly counseled on resuming Crestor Will send of lipid panel Low-cholesterol diet - LP+Non-HDL Cholesterol    Meds ordered this encounter  Medications   pantoprazole (PROTONIX) 40 MG tablet    Sig: Take  1 tablet (40 mg total) by mouth daily.    Dispense:  90 tablet    Refill:  1   lisinopril (ZESTRIL) 20 MG tablet    Sig: TAKE 1 TABLET(20 MG) BY MOUTH DAILY    Dispense:  90 tablet    Refill:  1    Follow-up: Return in about 6 months (around 04/12/2023) for Chronic medical conditions.       Hoy Register, MD, FAAFP. [facility] [place], [place] [phone]   10/11/2022, 12:28 PM

## 2022-10-12 LAB — LP+NON-HDL CHOLESTEROL
Cholesterol, Total: 223 mg/dL — ABNORMAL HIGH (ref 100–199)
HDL: 50 mg/dL (ref >39)
LDL Chol Calc (NIH): 147 mg/dL — ABNORMAL HIGH (ref 0–99)
Total Non-HDL-Chol (LDL+VLDL): 173 mg/dL — ABNORMAL HIGH (ref 0–129)
Triglycerides: 142 mg/dL (ref 0–149)
VLDL Cholesterol Cal: 26 mg/dL (ref 5–40)

## 2022-10-15 ENCOUNTER — Telehealth: Payer: Self-pay

## 2022-10-15 NOTE — Telephone Encounter (Signed)
Patient has requested to have time to think about scheduling her colonoscopy.  She had a bad experience with her last colonoscopy and ended up with a colostomy.  A reminder letter has been sent to her to call office to schedule when she is ready to do so.  Thanks,  [place], [place]

## 2022-11-16 ENCOUNTER — Ambulatory Visit: Payer: 59 | Admitting: Family Medicine

## 2022-12-07 ENCOUNTER — Other Ambulatory Visit: Payer: Self-pay

## 2022-12-07 ENCOUNTER — Emergency Department (HOSPITAL_COMMUNITY)
Admission: EM | Admit: 2022-12-07 | Discharge: 2022-12-08 | Disposition: A | Discharge status: Home / Self Care | Payer: 59 | Attending: Emergency Medicine | Admitting: Emergency Medicine

## 2022-12-07 DIAGNOSIS — R109 Unspecified abdominal pain: Secondary | ICD-10-CM | POA: Diagnosis present

## 2022-12-07 DIAGNOSIS — K439 Ventral hernia without obstruction or gangrene: Secondary | ICD-10-CM | POA: Diagnosis not present

## 2022-12-07 DIAGNOSIS — J449 Chronic obstructive pulmonary disease, unspecified: Secondary | ICD-10-CM | POA: Diagnosis not present

## 2022-12-07 DIAGNOSIS — R1084 Generalized abdominal pain: Secondary | ICD-10-CM

## 2022-12-07 NOTE — ED Triage Notes (Signed)
Pt has had abdominal pain radiating to her back and her "entire core" for 3 days.  She has a large hernia on the right side of her abdomen.  Cannot tell if it has changed but it is painful as well.  No diarrhea, no n/v.

## 2022-12-08 ENCOUNTER — Encounter (HOSPITAL_COMMUNITY): Payer: Self-pay | Admitting: General Surgery

## 2022-12-08 ENCOUNTER — Emergency Department (HOSPITAL_COMMUNITY): Payer: 59

## 2022-12-08 DIAGNOSIS — K439 Ventral hernia without obstruction or gangrene: Secondary | ICD-10-CM | POA: Diagnosis not present

## 2022-12-08 LAB — CBC
HCT: 46.4 % — ABNORMAL HIGH (ref 36.0–46.0)
Hemoglobin: 14.7 g/dL (ref 12.0–15.0)
MCH: 27.6 pg (ref 26.0–34.0)
MCHC: 31.7 g/dL (ref 30.0–36.0)
MCV: 87.2 fL (ref 80.0–100.0)
Platelets: 316 10*3/uL (ref 150–400)
RBC: 5.32 MIL/uL — ABNORMAL HIGH (ref 3.87–5.11)
RDW: 14.4 % (ref 11.5–15.5)
WBC: 10.2 10*3/uL (ref 4.0–10.5)
nRBC: 0 % (ref 0.0–0.2)

## 2022-12-08 LAB — URINALYSIS, ROUTINE W REFLEX MICROSCOPIC
Bilirubin Urine: NEGATIVE
Glucose, UA: NEGATIVE mg/dL
Hgb urine dipstick: NEGATIVE
Ketones, ur: NEGATIVE mg/dL
Leukocytes,Ua: NEGATIVE
Nitrite: NEGATIVE
Protein, ur: NEGATIVE mg/dL
Specific Gravity, Urine: 1.008 (ref 1.005–1.030)
pH: 6 (ref 5.0–8.0)

## 2022-12-08 LAB — COMPREHENSIVE METABOLIC PANEL
ALT: 19 U/L (ref 0–44)
AST: 18 U/L (ref 15–41)
Albumin: 3.9 g/dL (ref 3.5–5.0)
Alkaline Phosphatase: 67 U/L (ref 38–126)
Anion gap: 9 (ref 5–15)
BUN: 6 mg/dL — ABNORMAL LOW (ref 8–23)
CO2: 27 mmol/L (ref 22–32)
Calcium: 9.1 mg/dL (ref 8.9–10.3)
Chloride: 103 mmol/L (ref 98–111)
Creatinine, Ser: 0.7 mg/dL (ref 0.44–1.00)
GFR, Estimated: >60 mL/min (ref >60)
Glucose, Bld: 80 mg/dL (ref 70–99)
Potassium: 3.7 mmol/L (ref 3.5–5.1)
Sodium: 139 mmol/L (ref 135–145)
Total Bilirubin: 0.2 mg/dL — ABNORMAL LOW (ref 0.3–1.2)
Total Protein: 6.5 g/dL (ref 6.5–8.1)

## 2022-12-08 LAB — LIPASE, BLOOD: Lipase: 26 U/L (ref 11–51)

## 2022-12-08 MED ORDER — SODIUM CHLORIDE 0.9 % IV BOLUS
1000.0000 mL | Freq: Once | INTRAVENOUS | Status: AC
  Administered 2022-12-08: 1000 mL via INTRAVENOUS

## 2022-12-08 MED ORDER — ONDANSETRON HCL 4 MG/2ML IJ SOLN
4.0000 mg | Freq: Once | INTRAMUSCULAR | Status: AC
  Administered 2022-12-08: 4 mg via INTRAVENOUS
  Filled 2022-12-08: qty 2

## 2022-12-08 MED ORDER — POLYETHYLENE GLYCOL 3350 17 GM/SCOOP PO POWD: 17.0000 g | Freq: Every day | ORAL | 0 refills | Status: AC

## 2022-12-08 MED ORDER — PANTOPRAZOLE SODIUM 40 MG PO TBEC: 40.0000 mg | DELAYED_RELEASE_TABLET | Freq: Every day | ORAL | 0 refills | Status: DC

## 2022-12-08 MED ORDER — MORPHINE SULFATE (PF) 4 MG/ML IV SOLN
4.0000 mg | Freq: Once | INTRAVENOUS | Status: AC
  Administered 2022-12-08: 4 mg via INTRAVENOUS
  Filled 2022-12-08: qty 1

## 2022-12-08 MED ORDER — IOHEXOL 350 MG/ML SOLN
75.0000 mL | Freq: Once | INTRAVENOUS | Status: AC | PRN
  Administered 2022-12-08: 75 mL via INTRAVENOUS

## 2022-12-08 NOTE — ED Notes (Signed)
Patient is a&ox4, pwd. Pt complains of generalized abdominal pain. Pt is resting in bed with side rails x 2 up, call light with patient.

## 2022-12-08 NOTE — Discharge Instructions (Addendum)
Today your Maria Oliver bowel was found to be dilated, I need you to drink lots of fluids, and rest when you get home.  If you start having vomiting, or continue not to have a bowel movement please return to the ER.  General surgery decided that they did not want to admit you to the hospital, and it was okay to go home, but you should follow-up with them in GI.  I believe that your epigastric pain is likely secondary to alcohol and ulcer versus gastritis, please follow-up with GI, and take the Protonix as prescribed.  If you feel like your condition is getting worse please return to the ER.

## 2022-12-08 NOTE — ED Provider Notes (Signed)
[place] EMERGENCY DEPARTMENT AT [facility] Provider Note   CSN: [id number] Arrival date & time: 12/07/22  2242     History  Chief Complaint  Patient presents with   Abdominal Pain    Maria Oliver is a 62 y.o. female.  The history is provided by the patient and medical records. No language interpreter was used.  Abdominal Pain    62 year old female significant history of right inguinal hernia, COPD, chronic opiate use presenting with complaint of abdominal pain.  Patient reports the past 3 days she has had pain across her abdomen radiates towards her back.  Pain is intermittent but becoming more steady and now localized more so on the left side of her abdomen.  She endorsed some nausea and indigestion.  Last bowel movement was 3 days ago.  She normally has bowel movement every 2 days.  She is able to pass flatus.  She does not endorse fever or chills no chest pain or shortness of breath no urinary symptoms.  She has not noticed any blood in the urine.  She has an hernia but no worsening hernia.  She tries home medication without relief.  Home Medications Prior to Admission medications   Medication Sig Start Date End Date Taking? Authorizing Provider  acetaminophen (TYLENOL) 500 MG tablet Take 500-1,000 mg by mouth daily as needed for headache, mild pain or fever.    [provider]  albuterol (VENTOLIN HFA) 108 (90 Base) MCG/ACT inhaler Inhale 2 puffs into the lungs every 6 (six) hours as needed for wheezing or shortness of breath. 12/08/21   Hoy Register, MD  cimetidine (TAGAMET) 200 MG tablet Take 200 mg by mouth 2 (two) times daily as needed (stomach pain). Patient not taking: Reported on 10/11/2022    [provider]  Fluticasone-Umeclidin-Vilant (TRELEGY ELLIPTA) 100-62.5-25 MCG/ACT AEPB Inhale 1 puff into the lungs daily. 03/23/22   Hoy Register, MD  ibuprofen (ADVIL) 400 MG tablet Take 1 tablet (400 mg total) by mouth every 8 (eight) hours  as needed for up to 21 doses for mild pain or moderate pain. Take with food 09/07/22   Terald Sleeper, MD  lisinopril (ZESTRIL) 20 MG tablet TAKE 1 TABLET(20 MG) BY MOUTH DAILY 10/11/22   Hoy Register, MD  Multiple Vitamin (MULTIVITAMIN WITH MINERALS) TABS tablet Take 1 tablet by mouth daily.    [provider]  nicotine (NICODERM CQ) 14 mg/24hr patch Place 1 patch (14 mg total) onto the skin daily. For 4 weeks then switch to 7 mg per 24 hours thereafter Patient not taking: Reported on 05/12/2022 08/25/21   Hoy Register, MD  pantoprazole (PROTONIX) 40 MG tablet Take 1 tablet (40 mg total) by mouth daily. 10/11/22   Hoy Register, MD  rosuvastatin (CRESTOR) 5 MG tablet TAKE 1 TABLET(5 MG) BY MOUTH DAILY Patient not taking: Reported on 10/11/2022 07/08/22   Hoy Register, MD      Allergies    Aspirin, Ciprofloxacin, Codeine, Egg-derived products, Fentanyl, Hydrocodone, Lactose intolerance (gi), Oxycodone, Penicillin g, Penicillins, and Tape    Review of Systems   Review of Systems  Gastrointestinal:  Positive for abdominal pain.  All other systems reviewed and are negative.   Physical Exam Updated Vital Signs BP 130/84 (BP Location: Left Arm)   Pulse 67   Temp 98.2 F (36.8 C)   Resp 14   SpO2 94%  Physical Exam Vitals and nursing note reviewed.  Constitutional:      General: She is not in  acute distress.    Appearance: She is well-developed.  HENT:     Head: Atraumatic.  Eyes:     Conjunctiva/sclera: Conjunctivae normal.  Cardiovascular:     Rate and Rhythm: Normal rate and regular rhythm.  Pulmonary:     Effort: Pulmonary effort is normal.  Abdominal:     General: Bowel sounds are normal.     Palpations: Abdomen is soft.     Hernia: A hernia is present. Hernia is present in the ventral area and right inguinal area.  Musculoskeletal:     Cervical back: Neck supple.  Skin:    Findings: No rash.  Neurological:     Mental Status: She is alert.   Psychiatric:        Mood and Affect: Mood normal.     ED Results / Procedures / Treatments   Labs (all labs ordered are listed, but only abnormal results are displayed) Labs Reviewed  COMPREHENSIVE METABOLIC PANEL - Abnormal; Notable for the following components:      Result Value   BUN 6 (*)    Total Bilirubin 0.2 (*)    All other components within normal limits  CBC - Abnormal; Notable for the following components:   RBC 5.32 (*)    HCT 46.4 (*)    All other components within normal limits  LIPASE, BLOOD  URINALYSIS, ROUTINE W REFLEX MICROSCOPIC    EKG None  Radiology CT ABDOMEN PELVIS W CONTRAST  Result Date: 12/08/2022 CLINICAL DATA:  Generalized abdominal pain. EXAM: CT ABDOMEN AND PELVIS WITH CONTRAST TECHNIQUE: Multidetector CT imaging of the abdomen and pelvis was performed using the standard protocol following bolus administration of intravenous contrast. RADIATION DOSE REDUCTION: This exam was performed according to the departmental dose-optimization program which includes automated exposure control, adjustment of the mA and/or kV according to patient size and/or use of iterative reconstruction technique. CONTRAST:  75mL OMNIPAQUE IOHEXOL 350 MG/ML SOLN COMPARISON:  CT with IV contrast 07/24/2022, CTA chest, abdomen and pelvis 02/22/2021 FINDINGS: Lower chest: The cardiac size is normal. No pericardial effusion seen. Lung bases show mild atelectasis without infiltrates. Hepatobiliary: Mildly steatotic liver, length 16 cm. No mass. Surgically absent gallbladder without significant biliary prominence. Pancreas: No abnormality. Spleen: There are stable chronic subcentimeter hypodensities scattered throughout the spleen. No interval change. No splenomegaly or new abnormality. Adrenals/Urinary Tract: There is no adrenal mass. There are small left renal cysts, unchanged. The right kidney is unremarkable. There is no renal mass enhancement. There is no urinary stone or obstruction.  There is symmetric excretion in the delayed phase. The bladder is normal in thickness. Stomach/Bowel: The stomach and upper to mid abdominal small bowel are unremarkable. Rectus diastasis is again noted at and below the umbilical level eccentric to the right with a large wide mouth anterior pelvic wall hernia again noted containing multiple small bowel segments. In contrast to the prior studies, the small bowel within the hernia sac demonstrates mild dilatation in some segments up to 2.7 cm, compatible with a low-grade obstruction as the downstream small bowel within the abdomen proper is decompressed. This is probably about mid ileal in location. There are no mesenteric edematous or inflammatory changes. An appendix is not seen in this patient. There is mild fecal stasis. Uncomplicated scattered diverticulosis with otherwise unremarkable large bowel. Vascular/Lymphatic: Aortic atherosclerosis. No enlarged abdominal or pelvic lymph nodes. Reproductive: Uterus and bilateral adnexa are unremarkable. Other: There is no free fluid, free air or free hemorrhage. No evidence of hernia incarceration. No wall  thickening in the dilated segments in the hernia sac. Musculoskeletal: Osteopenia and mild degenerative change of the spine. 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 No fracture or aggressive bone lesion is seen. IMPRESSION: 1. Large wide mouth anterior pelvic wall hernia containing multiple small bowel segments, with mild dilatation of some of the small bowel loops in the hernia sac up to 2.7 cm, compatible with a low-grade small bowel obstruction. No mesenteric edema or inflammatory change. No bowel pneumatosis in the hernia sac or elsewhere. 2. Constipation and diverticulosis. 3. Aortic atherosclerosis. 4. Stable subcentimeter splenic hypodensities. 5. Osteopenia and degenerative change. Aortic Atherosclerosis (ICD10-I70.0). Electronically Signed   By: Almira Bar M.D.   On:  12/08/2022 06:09    Procedures Procedures    Medications Ordered in ED Medications  sodium chloride 0.9 % bolus 1,000 mL (0 mLs Intravenous Stopped 12/08/22 0629)  ondansetron (ZOFRAN) injection 4 mg (4 mg Intravenous Given 12/08/22 0529)  morphine (PF) 4 MG/ML injection 4 mg (4 mg Intravenous Given 12/08/22 0530)  iohexol (OMNIPAQUE) 350 MG/ML injection 75 mL (75 mLs Intravenous Contrast Given 12/08/22 0550)    ED Course/ Medical Decision Making/ A&P                             Medical Decision Making Amount and/or Complexity of Data Reviewed Labs: ordered. Radiology: ordered.  Risk Prescription drug management.   BP 130/84 (BP Location: Left Arm)   Pulse 67   Temp 98.2 F (36.8 C)   Resp 14   SpO2 94%   60:9 AM  62 year old female significant history of right inguinal hernia, COPD, chronic opiate use presenting with complaint of abdominal pain.  Patient reports the past 3 days she has had pain across her abdomen radiates towards her back.  Pain is intermittent but becoming more steady and now localized more so on the left side of her abdomen.  She endorsed some nausea and indigestion.  Last bowel movement was 3 days ago.  She normally has bowel movement every 2 days.  She is able to pass flatus.  She does not endorse fever or chills no chest pain or shortness of breath no urinary symptoms.  She has not noticed any blood in the urine.  She has an hernia but no worsening hernia.  She tries home medication without relief.  On exam patient is laying bed appears to be in no acute discomfort.  Heart with normal rate and rhythm, lungs are clear to auscultation bilaterally abdomen is soft bowel sounds present's, mild diffuse tenderness no guarding or rebound tenderness.  Right inguinal hernia, large, without any overlying skin changes or erythema or warmth.  -Labs ordered, independently viewed and interpreted by me.  Labs remarkable for reassuring labs -The patient was maintained on a  cardiac monitor.  I personally viewed and interpreted the cardiac monitored which showed an underlying rhythm of: sinus brady -Imaging independently viewed and interpreted by me and I agree with radiologist's interpretation.  Result remarkable for abd/pelvis Showing SBO -This patient presents to the ED for concern of abd pain, this involves an extensive number of treatment options, and is a complaint that carries with it a high risk of complications and morbidity.  The differential diagnosis includes SBO, appendicitis, colitis, diverticulitis -Co morbidities that complicate the patient evaluation includes abdominal hernia -Treatment includes antiemetic, pain medication -Reevaluation of the patient after these medicines showed that the patient stayed the  same -PCP office notes or outside notes reviewed -Discussion with oncoming provider who will consult for admission of SBO causing sxs.  -Escalation to admission/observation considered: likely admission       Final Clinical Impression(s) / ED Diagnoses Final diagnoses:  None    Rx / DC Orders ED Discharge Orders     None         Fayrene Helper, PA-C 12/08/22 1610    Nira Conn, MD 12/08/22 0800

## 2022-12-08 NOTE — Consult Note (Signed)
Consult Note  Maria Oliver [birth year]  [id number].    Requesting MD: Fayrene Helper PA-C/Dr. Theresia Lo Chief Complaint/Reason for Consult: possible SBO  HPI:  62 y.o. female with medical history significant for anxiety, arthritis, COPD, [name] curtis syndrome, constipation, and HTN who presented to [facility] ED with abdominal pain located across her abdomen going into her back.  She has had chronic symptoms of epigastric pain radiating to her back and belching for "years now."  She has never had this evaluated.  She takes some Tagamet sometimes but didn't seem to help. Her pain slightly worsened 3 days ago as intermittent but is now greatest in left/upper abdomen. She has been eating well, but does state that eating can exacerbate her pain and cause worsening belching and gas type pains.  Dairy does not "agree with her."  She denies any N/V.  She denies any distention. She is passing flatus with last BM 3 days ago, which is not abnormal as she has been dealing with constipation recently. She denies hematochezia, melena, fever, chills, urinary or respiratory complaints.   Work up in ED significant for CT scan showing large wide mouthed pelvic wall hernia containing small bowel with some loops dilated, constipation, diverticulosis. We have been asked to see her as the CT scan questions an obstruction  Substance use: every day cigarette smoker Blood thinners: none Past abdominal Surgeries: laparoscopic cholecystectomy, tubal ligation, partial colectomy for a perforation after polypectomy during colonoscopy in Sept 2010 now s/p colostomy take down 2011 by Dr. 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 She has a 47.00 pack-year smoking history. She has never used smokeless tobacco. She reports that she does not drink alcohol and does not use drugs.  Allergies:  Allergies  Allergen Reactions   Aspirin Nausea Only and Other (See Comments)    GI upset/Hernia     Ciprofloxacin Nausea Only and Other (See Comments)    Tendonitis, joint pain, and nausea.  "Interfered with her HCTZ" (also)   Codeine Nausea And Vomiting and Other (See Comments)    (Also) GI upset/Hernia (CAN TOLERATE DILAUDID & MORPHINE)   Egg-Derived Products Nausea Only  Stomach pains   Fentanyl Nausea Only and Other (See Comments)    Tolerates Dilaudid or morphine much better   Hydrocodone Nausea And Vomiting   Lactose Intolerance (Gi) Nausea And Vomiting and Other (See Comments)    (Also) vertigo   Oxycodone Nausea And Vomiting    Tolerates hydromorphone better   Penicillin G Rash   Penicillins Rash    Has patient had a PCN reaction causing immediate rash, facial/tongue/throat swelling, SOB or lightheadedness with hypotension: Yes Has patient had a PCN reaction  causing severe rash involving mucus membranes or skin necrosis: No Has patient had a PCN reaction that required hospitalization No Has patient had a PCN reaction occurring within the last 10 years: No If all of the above answers are "NO", then may proceed with Cephalosporin use.    Tape Rash and Other (See Comments)    Please try paper tape    (Not in a hospital admission)   Blood pressure 130/72, pulse (!) 52, temperature 98.3 F (36.8 C), temperature source Oral, resp. rate 14, SpO2 94 %. Physical Exam: General: pleasant, WD, female who is laying in bed in NAD HEENT: head is normocephalic, atraumatic.  Sclera are noninjected.  Pupils equal and round. EOMs intact.  Ears and nose without any masses or lesions.  Mouth is pink and moist Heart: regular, rate, and rhythm.  Normal s1,s2. No obvious murmurs, gallops, or rubs noted.  Palpable radial and pedal pulses bilaterally Lungs: CTAB, no wheezes, rhonchi, or rales noted.  Respiratory effort nonlabored Abd: soft, mild tenderness across her upper abdomen, ND, +BS, no masses or organomegaly.  Large soft incisional hernia.  This is not really tender Psych: A&Ox3 with an appropriate affect.    Results for orders placed or performed during the hospital encounter of 12/07/22 (from the past 48 hour(s))  Lipase, blood     Status: None   Collection Time: 12/07/22 11:55 PM  Result Value Ref Range   Lipase 26 11 - 51 U/L    Comment: Performed at [facility], [address], [place], [place] [address]  Comprehensive metabolic panel     Status: Abnormal   Collection Time: 12/07/22 11:55 PM  Result Value Ref Range   Sodium 139 135 - 145 mmol/L   Potassium 3.7 3.5 - 5.1 mmol/L   Chloride 103 98 - 111 mmol/L   CO2 27 22 - 32 mmol/L   Glucose, Bld 80 70 - 99 mg/dL    Comment: Glucose reference range applies only to samples taken after fasting for at least 8 hours.   BUN 6 (L) 8 - 23 mg/dL   Creatinine, Ser 1.91 0.44 - 1.00 mg/dL   Calcium  9.1 8.9 - 47.8 mg/dL   Total Protein 6.5 6.5 - 8.1 g/dL   Albumin 3.9 3.5 - 5.0 g/dL   AST 18 15 - 41 U/L   ALT 19 0 - 44 U/L   Alkaline Phosphatase 67 38 - 126 U/L   Total Bilirubin 0.2 (L) 0.3 - 1.2 mg/dL   GFR, Estimated >29 >56 mL/min    Comment: (NOTE) Calculated using the CKD-EPI Creatinine Equation (2021)    Anion gap 9 5 - 15    Comment: Performed at [facility], [address] [place], [place] [address]  CBC     Status: Abnormal   Collection Time: 12/07/22 11:55 PM  Result Value Ref Range   WBC 10.2 4.0 - 10.5 K/uL   RBC 5.32 (H) 3.87 -  5.11 MIL/uL   Hemoglobin 14.7 12.0 - 15.0 g/dL   HCT 66.0 (H) 63.0 - 16.0 %   MCV 87.2 80.0 - 100.0 fL   MCH 27.6 26.0 - 34.0 pg   MCHC 31.7 30.0 - 36.0 g/dL   RDW 10.9 32.3 - 55.7 %   Platelets 316 150 - 400 K/uL   nRBC 0.0 0.0 - 0.2 %    Comment: Performed at [facility], [address] [place], [place] [address]  Urinalysis, Routine w reflex microscopic -Urine, Clean Catch     Status: None   Collection Time: 12/08/22 12:02 AM  Result Value Ref Range   Color, Urine YELLOW YELLOW   APPearance CLEAR CLEAR   Specific Gravity, Urine 1.008 1.005 - 1.030   pH 6.0 5.0 - 8.0   Glucose, UA NEGATIVE NEGATIVE mg/dL   Hgb urine dipstick NEGATIVE NEGATIVE   Bilirubin Urine NEGATIVE NEGATIVE   Ketones, ur NEGATIVE NEGATIVE mg/dL   Protein, ur NEGATIVE NEGATIVE mg/dL   Nitrite NEGATIVE NEGATIVE   Leukocytes,Ua NEGATIVE NEGATIVE    Comment: Performed at [facility], [address] [place], [place] [address]   CT ABDOMEN PELVIS W CONTRAST  Result Date: 12/08/2022 CLINICAL DATA:  Generalized abdominal pain. EXAM: CT ABDOMEN AND PELVIS WITH CONTRAST TECHNIQUE: Multidetector CT imaging of the abdomen and pelvis was performed using the standard protocol following bolus administration of intravenous contrast. RADIATION DOSE REDUCTION: This exam was performed according to the departmental dose-optimization program which  includes automated exposure control, adjustment of the mA and/or kV according to patient size and/or use of iterative reconstruction technique. CONTRAST:  75mL OMNIPAQUE IOHEXOL 350 MG/ML SOLN COMPARISON:  CT with IV contrast 07/24/2022, CTA chest, abdomen and pelvis 02/22/2021 FINDINGS: Lower chest: The cardiac size is normal. No pericardial effusion seen. Lung bases show mild atelectasis without infiltrates. Hepatobiliary: Mildly steatotic liver, length 16 cm. No mass. Surgically absent gallbladder without significant biliary prominence. Pancreas: No abnormality. Spleen: There are stable chronic subcentimeter hypodensities scattered throughout the spleen. No interval change. No splenomegaly or new abnormality. Adrenals/Urinary Tract: There is no adrenal mass. There are small left renal cysts, unchanged. The right kidney is unremarkable. There is no renal mass enhancement. There is no urinary stone or obstruction. There is symmetric excretion in the delayed phase. The bladder is normal in thickness. Stomach/Bowel: The stomach and upper to mid abdominal small bowel are unremarkable. Rectus diastasis is again noted at and below the umbilical level eccentric to the right with a large wide mouth anterior pelvic wall hernia again noted containing multiple small bowel segments. In contrast to the prior studies, the small bowel within the hernia sac demonstrates mild dilatation in some segments up to 2.7 cm, compatible with a low-grade obstruction as the downstream small bowel within the abdomen proper is decompressed. This is probably about mid ileal in location. There are no mesenteric edematous or inflammatory changes. An appendix is not seen in this patient. There is mild fecal stasis. Uncomplicated scattered diverticulosis with otherwise unremarkable large bowel. Vascular/Lymphatic: Aortic atherosclerosis. No enlarged abdominal or pelvic lymph nodes. Reproductive: Uterus and bilateral adnexa are unremarkable. Other:  There is no free fluid, free air or free hemorrhage. No evidence of hernia incarceration. No wall thickening in the dilated segments in the hernia sac. Musculoskeletal: Osteopenia and mild degenerative change of the spine. 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 No fracture or aggressive bone lesion is seen. IMPRESSION: 1. Large wide  mouth anterior pelvic wall hernia containing multiple small bowel segments, with mild dilatation of some of the small bowel loops in the hernia sac up to 2.7 cm, compatible with a low-grade small bowel obstruction. No mesenteric edema or inflammatory change. No bowel pneumatosis in the hernia sac or elsewhere. 2. Constipation and diverticulosis. 3. Aortic atherosclerosis. 4. Stable subcentimeter splenic hypodensities. 5. Osteopenia and degenerative change. Aortic Atherosclerosis (ICD10-I70.0). Electronically Signed   By: Almira Bar M.D.   On: 12/08/2022 06:09      Assessment/Plan Epigastric abdominal pain, large chronically incarcerated but not strangulated incisional hernia  Patient seen and examined and relevant labs and imaging personally reviewed. CT scan shows pelvic wall/ventral hernia containing small bowel loops with some minimal dilatation. Hernia defect is large and hernia is soft. No inflammatory changes or pneumatosis on imaging and she is not peritonitic on exam. She afebrile and HDS. She is not clinically obstructed at this time. Her symptoms are not consistent with obstructive process.  She has been eating and drinking well with no nausea or vomiting.  She is passing flatus still and last BM was 3 days ago which is not unusual given her constipation.  Her symptoms are more upper abdominal and somewhat chronic in nature, consisting of epigastric discomfort, burning, belching.  She has never seen GI for this.  At this time, I do not think she has an obstructive process going on.  We recommend a bowel regimen such as miralax  daily or even BID if needed, PPI/H2 blocker, and possible referral to GI for further evaluation if this fails to improve.  This was discussed with the ED provider.    FEN: may have a diet from our standpoint ID: none needed  Dispo: does not require admission from surgical standpoint  I reviewed ED provider notes, last 24 h vitals and pain scores, last 48 h intake and output, last 24 h labs and trends, and last 24 h imaging results.   Letha Cape, [facility] 12/08/2022, 7:31 AM Please see Amion for pager number during day hours 7:00am-4:30pm

## 2022-12-08 NOTE — ED Provider Notes (Signed)
Received handoff from B. Laveda Norman PA. Pending admission for SBO.    Physical Exam  BP 130/72   Pulse (!) 52   Temp 98.3 F (36.8 C) (Oral)   Resp 14   SpO2 94%   Physical Exam Vitals and nursing note reviewed.  Constitutional:      General: She is not in acute distress.    Appearance: She is well-developed.  HENT:     Head: Normocephalic and atraumatic.  Eyes:     Conjunctiva/sclera: Conjunctivae normal.  Cardiovascular:     Rate and Rhythm: Normal rate and regular rhythm.     Heart sounds: No murmur heard. Pulmonary:     Effort: Pulmonary effort is normal. No respiratory distress.     Breath sounds: Normal breath sounds.  Abdominal:     Palpations: Abdomen is soft.     Tenderness: There is abdominal tenderness in the epigastric area.     Hernia: A hernia is present. Hernia is present in the ventral area and right inguinal area.  Musculoskeletal:        General: No swelling.     Cervical back: Neck supple.  Skin:    General: Skin is warm and dry.     Capillary Refill: Capillary refill takes less than 2 seconds.  Neurological:     Mental Status: She is alert.  Psychiatric:        Mood and Affect: Mood normal.     Procedures  Procedures  ED Course / MDM    Medical Decision Making Patient here for constipation x 3 days, abdominal pain.  Found to have SBO in pelvic hernia.  Is still passing gas.  Spoke with Tresa Endo from general surgery, she states medicine to admit, and that they will evaluate the patient.  Amount and/or Complexity of Data Reviewed Labs: ordered.    Details: Labs unremarkable Radiology: ordered.    Details: CT abdomen pelvis, shows Atilano Covelli bowel obstruction, and ventral hernia Discussion of management or test interpretation with external provider(s): Received handoff initially with patient to be admitted to the hospital, for Jasai Sorg bowel obstruction, I spoke with general surgery and they evaluated patient, and recommended discharge home.  Per patient, her  pain is more epigastric, and she is able to tolerate p.o.  Epigastric pain radiates to the back, and associated associated with nausea, worse with foods.  I believe that she likely has a PUD, versus gastritis, and will need to be started on pantoprazole outpatient.  General surgery does not think that patient has a Monia Timmers bowel obstruction, as she is eating well, and passing gas.  She does have a history of constipation, and they recommend her being on MiraLAX.  I discussed this with the patient she is comfortable going home and following up with primary care doctor, general surgery and gastroenterology.  We will start her on MiraLAX daily, she was given strict return precautions and she voiced understanding.  She was well-appearing on exam.  Risk Prescription drug management.          Pete Pelt, [place] 12/08/22 0981    Rexford Maus, DO 12/08/22 1039

## 2022-12-09 ENCOUNTER — Ambulatory Visit (INDEPENDENT_AMBULATORY_CARE_PROVIDER_SITE_OTHER): Payer: 59 | Admitting: Gastroenterology

## 2022-12-09 ENCOUNTER — Encounter: Payer: Self-pay | Admitting: Gastroenterology

## 2022-12-09 VITALS — BP 138/86 | HR 72 | Ht 64.0 in | Wt 148.5 lb

## 2022-12-09 DIAGNOSIS — K56600 Partial intestinal obstruction, unspecified as to cause: Secondary | ICD-10-CM

## 2022-12-09 DIAGNOSIS — K439 Ventral hernia without obstruction or gangrene: Secondary | ICD-10-CM | POA: Diagnosis not present

## 2022-12-09 DIAGNOSIS — J449 Chronic obstructive pulmonary disease, unspecified: Secondary | ICD-10-CM

## 2022-12-09 DIAGNOSIS — K219 Gastro-esophageal reflux disease without esophagitis: Secondary | ICD-10-CM

## 2022-12-09 MED ORDER — PANTOPRAZOLE SODIUM 40 MG PO TBEC: 40.0000 mg | DELAYED_RELEASE_TABLET | Freq: Every day | ORAL | 3 refills | Status: DC

## 2022-12-09 NOTE — Patient Instructions (Addendum)
_______________________________________________________  If your blood pressure at your visit was 140/90 or greater, please contact your primary care physician to follow up on this.  _______________________________________________________  If you are age 64 or older, your body mass index should be between 23-30. Your Body mass index is 25.49 kg/m. If this is out of the aforementioned range listed, please consider follow up with your Primary Care Provider.  If you are age 37 or younger, your body mass index should be between 19-25. Your Body mass index is 25.49 kg/m. If this is out of the aformentioned range listed, please consider follow up with your Primary Care Provider.   ________________________________________________________  The [facility] providers would like to encourage you to use [facility] to communicate with providers for non-urgent requests or questions.  Due to long hold times on the telephone, sending your provider a message by [facility] may be a faster and more efficient way to get a response.  Please allow 48 business hours for a response.  Please remember that this is for non-urgent requests.  _______________________________________________________   Please make follow up appointment with [facility].  We have sent the following medications to your pharmacy for you to pick up at your convenience: Protonix daily  Please purchase the following medications over the counter and take as directed: Miralax 17g daily and if you have nausea and vomiting with this you can stop  A referral has been sent to the pulmonologist. Please call them in 2 weeks if you haven't heard from them  Thank you,  Dr. Lynann Bologna

## 2022-12-09 NOTE — Progress Notes (Signed)
Chief Complaint: To get established  Referring Provider:  Hoy Register, MD      ASSESSMENT AND PLAN;   #1. 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 Followed by Dr Michaell Cowing.  #2. GERD  #3. H/O partial sig colectomy with colostomy (Dr Michaell Cowing) for perforation after polypectomy 02/2009 followed by colostomy takedown 2011.   Plan: -Miralax 17g po every day. If N/V, stop  -Protonix 40mg  po every day #90, 4RF -If still with UGI problems despite trial of Protonix, would consider EGD.  -FU with Dr Michaell Cowing ([facility]). -Appt with pulm. -Stop smoking. -She adamantly refuses another colonoscopy.  I do agree. -Avoid nonsteroidals. -FU in 12 weeks.  Unfortunately, no good options from GI standpoint.   HPI:    Maria Oliver is a 62 y.o. female  With multiple medical problems including COPD ([facility]) with continued smoking, anxiety, arthritis, HTN, Fitz-Hugh Curtis syndrome.  Seen as emergency workin.  With very complex GI history  Had partial sigmoid colectomy with colostomy (Dr Michaell Cowing) for perforation after polypectomy during colonoscopy 02/2009 followed by colostomy takedown 2011.  Also had lap chole, BTL.  Now with large pelvic/ventral hernia containing small bowel loops.  She has recurrent partial small bowel obstruction  Multiple ED visits and adm-seen by surgery yesterday for PSBO (w/t large ventral hernia) during ED visit.  Advised conservative management for now due to high risk for surgery.  Started on Protonix, MiraLAX.  She has occasional heartburn but not bad.  Denies having any significant nausea or vomiting currently.  She always had abdominal distention.  Also with longstanding constipation with BMs every other day.  She has been drinking plenty of water.  Started on MiraLAX yesterday.  She is tolerating well.  Denies having any melena or hematochezia.  She is not interested in another colonoscopy since that "started all her problems".   Past GI  workup/procedures  Colonoscopy (Dr Elnoria Howard): 02/2009: 2 cm TV sigmoid polyp s/p polypectomy complicated by perforation requiring sigmoid colectomy, colostomy 2010, followed by colostomy takedown 2011  CT AP with contrast 12/08/2022 1. Large wide mouth anterior pelvic wall hernia containing multiple small bowel segments, with mild dilatation of some of the small bowel loops in the hernia sac up to 2.7 cm, compatible with a low-grade small bowel obstruction. No mesenteric edema or inflammatory change. No bowel pneumatosis in the hernia sac or elsewhere. 2. Constipation and diverticulosis. 3. Aortic atherosclerosis. 4. Stable subcentimeter splenic hypodensities. 5. Osteopenia and degenerative change.   Aortic Atherosclerosis (ICD10-I70.0).      Dr Elnoria Howard. He states "records reviewed. 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 Perforation requiring surgery.  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 09/09/2016   Generalized headaches    Hernia, inguinal, right    Hypertension    Leg swelling     Past Surgical History:  Procedure Laterality Date   COLON SURGERY  2010   per patient "colon take down"   COLOSTOMY  2010   LAPAROSCOPIC CHOLECYSTECTOMY SINGLE SITE WITH INTRAOPERATIVE CHOLANGIOGRAM N/A 09/09/2016   Procedure: LAPAROSCOPIC CHOLECYSTECTOMY  WITH INTRAOPERATIVE CHOLANGIOGRAM;  Surgeon: Karie Soda, MD;  Location: [facility];  Service: General;  Laterality: N/A;   TUBAL LIGATION      Family History  Problem Relation Age of Onset   Heart disease Mother 64   Heart failure Mother    Asthma Mother    Emphysema Mother    Alzheimer's disease Father 34    Emphysema Brother    Asthma Brother    Colon cancer Neg Hx    Rectal cancer Neg Hx    Esophageal cancer Neg Hx    Pancreatic cancer Neg Hx     Social History   Tobacco Use   Smoking status: Every Day    Packs/day: 1.00    Years: 47.00    Additional pack years: 0.00    Total pack years: 47.00    Types: Cigarettes   Smokeless tobacco: Never   Tobacco comments:    1/2 ppd 02/25/20   Vaping Use   Vaping Use: Never used  Substance Use Topics   Alcohol use: No    Alcohol/week: 0.0 standard drinks of alcohol   Drug use: No    Current Outpatient Medications  Medication Sig Dispense Refill   acetaminophen (TYLENOL) 500 MG tablet Take 500-1,000 mg by mouth daily as needed for headache, mild pain or fever.     albuterol (VENTOLIN HFA) 108 (90 Base) MCG/ACT inhaler Inhale 2 puffs into the lungs every 6 (six) hours as needed for wheezing or shortness of breath. 25.5 g 1   amLODipine (NORVASC) 5 MG tablet Take 5 mg by mouth daily.     cimetidine (TAGAMET) 200 MG tablet Take 200 mg by mouth 2 (two) times daily as needed (stomach pain).     ibuprofen (ADVIL) 400 MG tablet Take 1 tablet (400 mg total) by mouth every 8 (eight) hours as needed for up to 21 doses for mild pain or moderate pain. Take with food 21 tablet 0   lisinopril (ZESTRIL) 20 MG tablet TAKE 1 TABLET(20 MG) BY MOUTH DAILY 90 tablet 1   Multiple Vitamin (MULTIVITAMIN WITH MINERALS) TABS tablet Take 1 tablet by mouth daily.     pantoprazole (PROTONIX) 40 MG tablet Take 1 tablet (40 mg total) by mouth daily. 30 tablet 0   polyethylene glycol powder (MIRALAX) 17 GM/SCOOP powder Take 17 g by mouth daily. 510 g 0   rosuvastatin (CRESTOR) 5 MG tablet TAKE 1 TABLET(5 MG) BY MOUTH DAILY 90 tablet 1   Fluticasone-Umeclidin-Vilant (TRELEGY ELLIPTA) 100-62.5-25 MCG/ACT AEPB Inhale 1 puff into the lungs daily. (Patient not taking: Reported on 12/09/2022) 1 each 3   nicotine (NICODERM CQ) 14 mg/24hr patch Place 1 patch (14 mg total) onto  the skin daily. For 4 weeks then switch to 7 mg per 24 hours thereafter (Patient not taking: Reported on 12/09/2022) 28 patch 3   No current facility-administered medications for this visit.    Allergies  Allergen Reactions   Aspirin Nausea Only and Other (See Comments)    GI upset/Hernia     Ciprofloxacin Nausea Only and Other (See Comments)    Tendonitis, joint pain, and nausea.  "Interfered with her HCTZ" (also)   Codeine Nausea And Vomiting and Other (See Comments)    (Also) GI upset/Hernia (CAN TOLERATE DILAUDID & MORPHINE)   Egg-Derived Products Nausea Only    Stomach pains   Fentanyl Nausea Only and Other (See Comments)    Tolerates Dilaudid or morphine much better   Hydrocodone Nausea And Vomiting   Lactose Intolerance (Gi) Nausea And Vomiting and Other (See Comments)    (  Also) vertigo   Oxycodone Nausea And Vomiting    Tolerates hydromorphone better   Penicillin G Rash   Penicillins Rash    Has patient had a PCN reaction causing immediate rash, facial/tongue/throat swelling, SOB or lightheadedness with hypotension: Yes Has patient had a PCN reaction causing severe rash involving mucus membranes or skin necrosis: No Has patient had a PCN reaction that required hospitalization No Has patient had a PCN reaction occurring within the last 10 years: No If all of the above answers are "NO", then may proceed with Cephalosporin use.    Tape Rash and Other (See Comments)    Please try paper tape    Review of Systems:  Constitutional: Denies fever, chills, diaphoresis, appetite change and has fatigue.  HEENT: Has sinusitis Respiratory: Has SOB, DOE, cough, chest tightness,  and wheezing.   Cardiovascular: Denies chest pain, palpitations and leg swelling.  Genitourinary: Denies dysuria, urgency, frequency, hematuria, flank pain and difficulty urinating.  Musculoskeletal: Has myalgias, back pain, joint swelling, arthralgias and gait problem.  Skin: No rash.  Neurological: Denies  dizziness, seizures, syncope, weakness, light-headedness, numbness and headaches.  Hematological: Denies adenopathy. Easy bruising, personal or family bleeding history  Psychiatric/Behavioral: Has anxiety or depression     Physical Exam:    BP 138/86   Pulse 72   Ht 5\' 4"  (1.626 m)   Wt 148 lb 8 oz (67.4 kg)   SpO2 95%   BMI 25.49 kg/m  Wt Readings from Last 3 Encounters:  12/09/22 148 lb 8 oz (67.4 kg)  10/11/22 150 lb 3.2 oz (68.1 kg)  09/07/22 151 lb 14.4 oz (68.9 kg)   Constitutional: Thin built.  In no acute distress. Psychiatric: Normal mood and affect. Behavior is normal. HEENT: Pupils normal.  Conjunctivae are normal. No scleral icterus. Cardiovascular: Normal rate, regular rhythm. No edema Pulmonary/chest: Bilateral decreased breath sounds with occasional wheezing. Abdominal: Soft, large anterior pelvic incisional hernia with bowel sounds. Nontender. Bowel sounds active throughout. There are no masses palpable. No hepatomegaly. Rectal: Deferred Neurological: Alert and oriented to person place and time. Skin: Skin is warm and dry. No rashes noted.  Data Reviewed: I have personally reviewed following labs and imaging studies  CBC:    Latest Ref Rng & Units 12/07/2022   11:55 PM 09/07/2022    7:25 PM 07/27/2022    4:11 AM  CBC  WBC 4.0 - 10.5 K/uL 10.2  9.2  9.3   Hemoglobin 12.0 - 15.0 g/dL 16.1  09.6  04.5   Hematocrit 36.0 - 46.0 % 46.4  47.1  45.8   Platelets 150 - 400 K/uL 316  352  298     CMP:    Latest Ref Rng & Units 12/07/2022   11:55 PM 09/07/2022    7:25 PM 07/27/2022    4:11 AM  CMP  Glucose 70 - 99 mg/dL 80  409  811   BUN 8 - 23 mg/dL 6  5  12    Creatinine 0.44 - 1.00 mg/dL 9.14  7.82  9.56   Sodium 135 - 145 mmol/L 139  140  138   Potassium 3.5 - 5.1 mmol/L 3.7  3.8  3.6   Chloride 98 - 111 mmol/L 103  100  104   CO2 22 - 32 mmol/L 27  29  23    Calcium 8.9 - 10.3 mg/dL 9.1  9.4  9.2   Total Protein 6.5 - 8.1 g/dL 6.5   6.5   Total Bilirubin  0.3 -  1.2 mg/dL 0.2   0.6   Alkaline Phos 38 - 126 U/L 67   63   AST 15 - 41 U/L 18   17   ALT 0 - 44 U/L 19   18     GFR: Estimated Creatinine Clearance: 67.1 mL/min (by C-G formula based on SCr of 0.7 mg/dL). Liver Function Tests: Recent Labs  Lab 12/07/22 2355  AST 18  ALT 19  ALKPHOS 67  BILITOT 0.2*  PROT 6.5  ALBUMIN 3.9   Recent Labs  Lab 12/07/22 2355  LIPASE 26      Radiology Studies: CT ABDOMEN PELVIS W CONTRAST  Result Date: 12/08/2022 CLINICAL DATA:  Generalized abdominal pain. EXAM: CT ABDOMEN AND PELVIS WITH CONTRAST TECHNIQUE: Multidetector CT imaging of the abdomen and pelvis was performed using the standard protocol following bolus administration of intravenous contrast. RADIATION DOSE REDUCTION: This exam was performed according to the departmental dose-optimization program which includes automated exposure control, adjustment of the mA and/or kV according to patient size and/or use of iterative reconstruction technique. CONTRAST:  75mL OMNIPAQUE IOHEXOL 350 MG/ML SOLN COMPARISON:  CT with IV contrast 07/24/2022, CTA chest, abdomen and pelvis 02/22/2021 FINDINGS: Lower chest: The cardiac size is normal. No pericardial effusion seen. Lung bases show mild atelectasis without infiltrates. Hepatobiliary: Mildly steatotic liver, length 16 cm. No mass. Surgically absent gallbladder without significant biliary prominence. Pancreas: No abnormality. Spleen: There are stable chronic subcentimeter hypodensities scattered throughout the spleen. No interval change. No splenomegaly or new abnormality. Adrenals/Urinary Tract: There is no adrenal mass. There are small left renal cysts, unchanged. The right kidney is unremarkable. There is no renal mass enhancement. There is no urinary stone or obstruction. There is symmetric excretion in the delayed phase. The bladder is normal in thickness. Stomach/Bowel: The stomach and upper to mid abdominal small bowel are unremarkable. Rectus  diastasis is again noted at and below the umbilical level eccentric to the right with a large wide mouth anterior pelvic wall hernia again noted containing multiple small bowel segments. In contrast to the prior studies, the small bowel within the hernia sac demonstrates mild dilatation in some segments up to 2.7 cm, compatible with a low-grade obstruction as the downstream small bowel within the abdomen proper is decompressed. This is probably about mid ileal in location. There are no mesenteric edematous or inflammatory changes. An appendix is not seen in this patient. There is mild fecal stasis. Uncomplicated scattered diverticulosis with otherwise unremarkable large bowel. Vascular/Lymphatic: Aortic atherosclerosis. No enlarged abdominal or pelvic lymph nodes. Reproductive: Uterus and bilateral adnexa are unremarkable. Other: There is no free fluid, free air or free hemorrhage. No evidence of hernia incarceration. No wall thickening in the dilated segments in the hernia sac. Musculoskeletal: Osteopenia and mild degenerative change of the spine. 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 No fracture or aggressive bone lesion is seen. IMPRESSION: 1. Large wide mouth anterior pelvic wall hernia containing multiple small bowel segments, with mild dilatation of some of the small bowel loops in the hernia sac up to 2.7 cm, compatible with a low-grade small bowel obstruction. No mesenteric edema or inflammatory change. No bowel pneumatosis in the hernia sac or elsewhere. 2. Constipation and diverticulosis. 3. Aortic atherosclerosis. 4. Stable subcentimeter splenic hypodensities. 5. Osteopenia and degenerative change. Aortic Atherosclerosis (ICD10-I70.0). Electronically Signed   By: Almira Bar M.D.   On: 12/08/2022 06:09    CT reviewed independently and with the patient.   Edman Circle,  MD 12/09/2022, 2:16 PM  Cc: Hoy Register, MD

## 2022-12-22 ENCOUNTER — Telehealth: Payer: Self-pay | Admitting: Family Medicine

## 2022-12-22 ENCOUNTER — Other Ambulatory Visit: Payer: Self-pay | Admitting: Family Medicine

## 2022-12-22 DIAGNOSIS — J439 Emphysema, unspecified: Secondary | ICD-10-CM

## 2022-12-22 NOTE — Telephone Encounter (Signed)
Medication Refill - Medication: albuterol (VENTOLIN HFA) 108 (90 Base) MCG/ACT inhaler  amLODipine (NORVASC) 5 MG tablet  pantoprazole (PROTONIX) 40 MG tablet   Has the patient contacted their pharmacy? Yes.   (Agent: If no, request that the patient contact the pharmacy for the refill. If patient does not wish to contact the pharmacy document the reason why and proceed with request.) (Agent: If yes, when and what did the pharmacy advise?)  Preferred Pharmacy (with phone number or street name):  [id number] - [place], [place] - [address] AT [facility] [address]  [address] [place] [address]  Phone: [phone] Fax: [phone]   Has the patient been seen for an appointment in the last year OR does the patient have an upcoming appointment? Yes.    Agent: Please be advised that RX refills may take up to 3 business days. We ask that you follow-up with your pharmacy.

## 2022-12-22 NOTE — Telephone Encounter (Signed)
Pt wants her aeroflow shipment reinstated because she now has her [employer]. Pt needs wipes and pullups.   She also wants a handicap placard and wants to know if PCP needs to see her for this.

## 2022-12-23 ENCOUNTER — Other Ambulatory Visit: Payer: Self-pay | Admitting: Family Medicine

## 2022-12-23 MED ORDER — AMLODIPINE BESYLATE 5 MG PO TABS: 5.0000 mg | ORAL_TABLET | Freq: Every day | ORAL | 0 refills | Status: DC

## 2022-12-23 MED ORDER — PANTOPRAZOLE SODIUM 40 MG PO TBEC: 40.0000 mg | DELAYED_RELEASE_TABLET | Freq: Every day | ORAL | 0 refills | Status: DC

## 2022-12-23 MED ORDER — ALBUTEROL SULFATE HFA 108 (90 BASE) MCG/ACT IN AERS: 2.0000 | INHALATION_SPRAY | Freq: Four times a day (QID) | RESPIRATORY_TRACT | 1 refills | Status: DC | PRN

## 2022-12-23 NOTE — Telephone Encounter (Signed)
Requested medication (s) are due for refill today: For review  Requested medication (s) are on the active medication list: yes    Last refill: Amlodipine 12/09/22  Amount not specified.  Protonix  12/09/22  #90  3 refills  Future visit scheduled Yes 12/27/22  Notes to clinic: Historical providers.      Protonix not due, cannot refuse non-delegated meds per protocol (Historical provider.) Please review  Requested Prescriptions  Pending Prescriptions Disp Refills   amLODipine (NORVASC) 5 MG tablet      Sig: Take 1 tablet (5 mg total) by mouth daily.     Cardiovascular: Calcium Channel Blockers 2 Passed - 12/22/2022  3:44 PM      Passed - Last BP in normal range    BP Readings from Last 1 Encounters:  12/09/22 138/86         Passed - Last Heart Rate in normal range    Pulse Readings from Last 1 Encounters:  12/09/22 72         Passed - Valid encounter within last 6 months    Recent Outpatient Visits           2 months ago Essential hypertension   [facility] [place], Odette Horns, MD   7 months ago Herpes zoster without complication   [facility] [place], [place], [place]   7 months ago Encounter for [employer] annual wellness exam   [facility] [place], [place], [place]   7 months ago Essential hypertension   [facility] [place], [place] L, RPH-CPP   9 months ago Urinary symptom or sign   [facility] [place], Odette Horns, MD       Future Appointments             In 3 months Hoy Register, MD [facility]             pantoprazole (PROTONIX) 40 MG tablet 90 tablet 3    Sig: Take 1 tablet (40 mg total) by mouth daily.     Gastroenterology: Proton Pump Inhibitors Passed - 12/22/2022  3:44 PM      Passed - Valid encounter within last 12 months    Recent Outpatient Visits            2 months ago Essential hypertension   [facility] [place], Odette Horns, [place]   7 months ago Herpes zoster without complication   [facility] [place], Marzella Schlein, [place]   7 months ago Encounter for [employer] annual wellness exam   [facility] [place], Odette Horns, MD   7 months ago Essential hypertension   [facility] [place] L, RPH-CPP   9 months ago Urinary symptom or sign   [facility] [place] [place], Odette Horns, MD       Future Appointments             In 3 months Hoy Register, MD [facility]            Signed Prescriptions Disp Refills   albuterol (VENTOLIN HFA) 108 (90 Base) MCG/ACT inhaler 25.5 g 1    Sig: Inhale 2 puffs into the lungs every 6 (six) hours as needed for wheezing or shortness of  breath.     Pulmonology:  Beta Agonists 2 Passed - 12/22/2022  3:44 PM      Passed - Last BP in normal range    BP Readings from Last 1 Encounters:  12/09/22 138/86         Passed - Last Heart Rate in normal range    Pulse Readings from Last 1 Encounters:  12/09/22 72         Passed - Valid encounter within last 12 months    Recent Outpatient Visits           2 months ago Essential hypertension   [facility] [place], Odette Horns, MD   7 months ago Herpes zoster without complication   [facility] [place], [place], [place]   7 months ago Encounter for [employer] annual wellness exam   [facility] [place], [place], [place]   7 months ago Essential hypertension   [facility] [place] L, RPH-CPP   9 months ago Urinary symptom or sign   [facility] Hoy Register, MD       Future Appointments              In 3 months Hoy Register, MD [facility]

## 2022-12-23 NOTE — Telephone Encounter (Signed)
Pt has been called and scheduled for a telephone appointment.

## 2022-12-23 NOTE — Telephone Encounter (Signed)
Requested Prescriptions  Pending Prescriptions Disp Refills   albuterol (VENTOLIN HFA) 108 (90 Base) MCG/ACT inhaler 25.5 g 1    Sig: Inhale 2 puffs into the lungs every 6 (six) hours as needed for wheezing or shortness of breath.     Pulmonology:  Beta Agonists 2 Passed - 12/22/2022  3:44 PM      Passed - Last BP in normal range    BP Readings from Last 1 Encounters:  12/09/22 138/86         Passed - Last Heart Rate in normal range    Pulse Readings from Last 1 Encounters:  12/09/22 72         Passed - Valid encounter within last 12 months    Recent Outpatient Visits           2 months ago Essential hypertension   [place] [facility] [place], Odette Horns, MD   7 months ago Herpes zoster without complication   [facility] [place], [place], [place]   7 months ago Encounter for [employer] annual wellness exam   [place] [facility] [place], Odette Horns, MD   7 months ago Essential hypertension   [facility] [place], Amery L, RPH-CPP   9 months ago Urinary symptom or sign   [place] [facility] Hoy Register, MD       Future Appointments             In 3 months Hoy Register, MD [facility]             amLODipine (NORVASC) 5 MG tablet      Sig: Take 1 tablet (5 mg total) by mouth daily.     Cardiovascular: Calcium Channel Blockers 2 Passed - 12/22/2022  3:44 PM      Passed - Last BP in normal range    BP Readings from Last 1 Encounters:  12/09/22 138/86         Passed - Last Heart Rate in normal range    Pulse Readings from Last 1 Encounters:  12/09/22 72         Passed - Valid encounter within last 6 months    Recent Outpatient Visits           2 months ago Essential hypertension   [place] [facility] [place], Odette Horns, MD   7 months ago Herpes zoster  without complication   [facility] [place], [place], [place]   7 months ago Encounter for [employer] annual wellness exam   [place] [facility] [place], [place], [place]   7 months ago Essential hypertension   [facility] [place], Spickard L, RPH-CPP   9 months ago Urinary symptom or sign   [place] [facility] [place], Odette Horns, MD       Future Appointments             In 3 months Hoy Register, MD [facility]             pantoprazole (PROTONIX) 40 MG tablet 90 tablet 3    Sig: Take 1 tablet (40 mg total) by mouth daily.     Gastroenterology: Proton Pump Inhibitors Passed - 12/22/2022  3:44 PM      Passed -  Valid encounter within last 12 months    Recent Outpatient Visits           2 months ago Essential hypertension   [place] [facility] [place], Odette Horns, MD   7 months ago Herpes zoster without complication   [facility] [place], [place], [place]   7 months ago Encounter for [employer] annual wellness exam   [facility] [place], Odette Horns, MD   7 months ago Essential hypertension   [facility] [place], Cornelius Moras, RPH-CPP   9 months ago Urinary symptom or sign   [employer] [facility] Hoy Register, MD       Future Appointments             In 3 months Hoy Register, MD [facility]

## 2022-12-24 ENCOUNTER — Other Ambulatory Visit: Payer: Self-pay | Admitting: Family Medicine

## 2022-12-24 NOTE — Telephone Encounter (Signed)
Request was refilled 12/24/22, duplicate request.  Requested Prescriptions  Pending Prescriptions Disp Refills   rosuvastatin (CRESTOR) 5 MG tablet [Pharmacy Med Name: ROSUVASTATIN 5MG  TABLETS] 90 tablet     Sig: TAKE 1 TABLET(5 MG) BY MOUTH DAILY     Cardiovascular:  Antilipid - Statins 2 Failed - 12/24/2022  2:10 PM      Failed - Lipid Panel in normal range within the last 12 months    Cholesterol, Total  Date Value Ref Range Status  10/11/2022 223 (H) 100 - 199 mg/dL Final   LDL Chol Calc (NIH)  Date Value Ref Range Status  10/11/2022 147 (H) 0 - 99 mg/dL Final   HDL  Date Value Ref Range Status  10/11/2022 50 >39 mg/dL Final   Triglycerides  Date Value Ref Range Status  10/11/2022 142 0 - 149 mg/dL Final         Passed - Cr in normal range and within 360 days    Creat  Date Value Ref Range Status  08/11/2015 0.67 0.50 - 1.05 mg/dL Final   Creatinine, Ser  Date Value Ref Range Status  12/07/2022 0.70 0.44 - 1.00 mg/dL Final         Passed - Patient is not pregnant      Passed - Valid encounter within last 12 months    Recent Outpatient Visits           2 months ago Essential hypertension   [place] [facility] [place], Odette Horns, MD   7 months ago Herpes zoster without complication   [place] [facility] [place], [place], [place]   7 months ago Encounter for [employer] annual wellness exam   [employer] [facility] [place], Alcan [place], MD   7 months ago Essential hypertension   [facility] [place] [facility] [place], RPH-CPP   9 months ago Urinary symptom or sign   [employer] [facility] Hoy Register, MD       Future Appointments             In 3 months Hoy Register, MD [facility]

## 2022-12-27 ENCOUNTER — Ambulatory Visit: Payer: 59 | Attending: Family Medicine | Admitting: Family Medicine

## 2022-12-27 DIAGNOSIS — K439 Ventral hernia without obstruction or gangrene: Secondary | ICD-10-CM

## 2022-12-27 DIAGNOSIS — J432 Centrilobular emphysema: Secondary | ICD-10-CM

## 2022-12-27 DIAGNOSIS — N3946 Mixed incontinence: Secondary | ICD-10-CM | POA: Diagnosis not present

## 2022-12-27 MED ORDER — MISC. DEVICES MISC: 0 refills | Status: DC

## 2022-12-27 NOTE — Progress Notes (Signed)
Virtual Visit via Video Note  I connected with [name], on 12/27/2022 at 1:41 PM by video enabled telemedicine device and verified that I am speaking with the correct person using two identifiers.   Consent: I discussed the limitations, risks, security and privacy concerns of performing an evaluation and management service by telemedicine and the availability of in person appointments. I also discussed with the patient that there may be a patient responsible charge related to this service. The patient expressed understanding and agreed to proceed.   Location of Patient: Home  Location of Provider: Clinic   Persons participating in Telemedicine visit: [name] Dr. Alvis Lemmings     History of Present Illness: Maria Oliver is a 62 y.o. year old female  with a history of COPD, hypertension, tobacco abuse (smoking since elementary school).   Discussed the use of AI scribe software for clinical note transcription with the patient, who gave verbal consent to proceed.   She presents for incontinence supplies due to her urinary incontinence.   She reports worsening abdominal pain due to a small bowel obstruction for which she was seen at the ED and referred to follow-up with general surgery outpatient. The patient is scheduled for a surgical consultation, but is hesitant to undergo surgery due to her comorbidities and risk factors.  She also reports left-sided pain, the etiology of which is unclear and is wondering if she can receive a handicap form. The patient's COPD and asthma symptoms have been worsening, and she has an upcoming appointment with a pulmonologist.        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  "Interfered with her HCTZ" (also)   Codeine Nausea And Vomiting and Other (See Comments)    (Also) GI upset/Hernia (CAN TOLERATE DILAUDID & MORPHINE)   Egg-Derived Products Nausea Only    Stomach pains   Fentanyl Nausea Only and Other (See Comments)    Tolerates Dilaudid or morphine much better   Hydrocodone Nausea And Vomiting   Lactose Intolerance (Gi) Nausea And Vomiting and Other (See Comments)    (Also) vertigo   Oxycodone Nausea And Vomiting    Tolerates hydromorphone better   Penicillin G Rash   Penicillins Rash    Has patient had a PCN reaction causing immediate rash, facial/tongue/throat swelling, SOB or lightheadedness with hypotension: Yes Has patient had a PCN reaction causing severe rash involving mucus membranes or skin necrosis: No Has patient had a PCN reaction that required hospitalization No Has patient had a PCN reaction occurring within the last 10 years: No If all of the above answers are "NO", then may proceed with Cephalosporin use.    Tape Rash and Other (See Comments)    Please try paper tape    Current Outpatient Medications on File  Prior to Visit  Medication Sig Dispense Refill   acetaminophen (TYLENOL) 500 MG tablet Take 500-1,000 mg by mouth daily as needed for headache, mild pain or fever.     albuterol (VENTOLIN HFA) 108 (90 Base) MCG/ACT inhaler Inhale 2 puffs into the lungs every 6 (six) hours as needed for wheezing or shortness of breath. 25.5 g 1   amLODipine (NORVASC) 5 MG tablet Take 1 tablet (5 mg total) by mouth daily. 30 tablet 0   cimetidine (TAGAMET) 200 MG tablet Take 200 mg by mouth 2  (two) times daily as needed (stomach pain).     Fluticasone-Umeclidin-Vilant (TRELEGY ELLIPTA) 100-62.5-25 MCG/ACT AEPB Inhale 1 puff into the lungs daily. (Patient not taking: Reported on 12/09/2022) 1 each 3   ibuprofen (ADVIL) 400 MG tablet Take 1 tablet (400 mg total) by mouth every 8 (eight) hours as needed for up to 21 doses for mild pain or moderate pain. Take with food 21 tablet 0   lisinopril (ZESTRIL) 20 MG tablet TAKE 1 TABLET(20 MG) BY MOUTH DAILY 90 tablet 1   Multiple Vitamin (MULTIVITAMIN WITH MINERALS) TABS tablet Take 1 tablet by mouth daily.     nicotine (NICODERM CQ) 14 mg/24hr patch Place 1 patch (14 mg total) onto the skin daily. For 4 weeks then switch to 7 mg per 24 hours thereafter (Patient not taking: Reported on 12/09/2022) 28 patch 3   pantoprazole (PROTONIX) 40 MG tablet Take 1 tablet (40 mg total) by mouth daily. 30 tablet 0   polyethylene glycol powder (MIRALAX) 17 GM/SCOOP powder Take 17 g by mouth daily. 510 g 0   rosuvastatin (CRESTOR) 5 MG tablet TAKE 1 TABLET(5 MG) BY MOUTH DAILY 30 tablet 0   No current facility-administered medications on file prior to visit.    ROS: See hpi  Observations/Objective: Awake, alert, oriented x3 Not in acute distress Normal mood      Latest Ref Rng & Units 12/07/2022   11:55 PM 09/07/2022    7:25 PM 07/27/2022    4:11 AM  CMP  Glucose 70 - 99 mg/dL 80  161  096   BUN 8 - 23 mg/dL 6  5  12    Creatinine 0.44 - 1.00 mg/dL 0.45  4.09  8.11   Sodium 135 - 145 mmol/L 139  140  138   Potassium 3.5 - 5.1 mmol/L 3.7  3.8  3.6   Chloride 98 - 111 mmol/L 103  100  104   CO2 22 - 32 mmol/L 27  29  23    Calcium 8.9 - 10.3 mg/dL 9.1  9.4  9.2   Total Protein 6.5 - 8.1 g/dL 6.5   6.5   Total Bilirubin 0.3 - 1.2 mg/dL 0.2   0.6   Alkaline Phos 38 - 126 U/L 67   63   AST 15 - 41 U/L 18   17   ALT 0 - 44 U/L 19   18     Lipid Panel     Component Value Date/Time   CHOL 223 (H) 10/11/2022 1011   TRIG 142 10/11/2022 1011   HDL  50 10/11/2022 1011   CHOLHDL 3.5 06/28/2017 1157   CHOLHDL 4.7 05/09/2013 1445   VLDL 32 05/09/2013 1445   LDLCALC 147 (H) 10/11/2022 1011   LABVLDL 26 10/11/2022 1011    Lab Results  Component Value Date   HGBA1C 5.6 12/10/2020     Assessment and Plan:     Incontinence: Request for incontinence supplies  -Order  large pull-ups and wipes, approximately 96 per month.  Abdominal Hernia: Complaints of colon issues due to hernia on right side. Consultation with [facility] scheduled. Patient expresses fear of surgery due to bleeding and blood pressure issues. -Encourage patient to attend consultation and discuss options with surgeon. -Advised of warning symptoms which would indicate referral to the ED  COPD and Asthma: Patient reports 'shallow breathing'. Appointment with pulmonologist scheduled. -Advise patient to keep appointment with pulmonologist.  Handicap Parking Permit: Request for handicap parking permit due to left side pain and difficulty walking long distances. Patient does not meet current criteria for permit. -Advise patient to discuss with pulmonologist during upcoming appointment and given that her pulmonary symptoms might qualify her.  Follow-up: Patient to be seen after consultation with [facility].        Follow Up Instructions: Keep previously scheduled appointment   I discussed the assessment and treatment plan with the patient. The patient was provided an opportunity to ask questions and all were answered. The patient agreed with the plan and demonstrated an understanding of the instructions.   The patient was advised to call back or seek an in-person evaluation if the symptoms worsen or if the condition fails to improve as anticipated.     I provided 11 minutes total of Telehealth time during this encounter including median intraservice time, reviewing previous notes, investigations, ordering medications, medical decision making, coordinating  care and patient verbalized understanding at the end of the visit.     Hoy Register, MD, FAAFP. [facility] [place], [place] [phone]   12/27/2022, 1:41 PM

## 2023-01-04 ENCOUNTER — Encounter: Payer: Self-pay | Admitting: Surgery

## 2023-01-04 DIAGNOSIS — Z86010 Personal history of colonic polyps: Secondary | ICD-10-CM | POA: Insufficient documentation

## 2023-01-12 ENCOUNTER — Ambulatory Visit: Payer: 59 | Attending: Family Medicine

## 2023-01-12 VITALS — Ht 64.0 in | Wt 148.0 lb

## 2023-01-12 DIAGNOSIS — Z0000 Encounter for general adult medical examination without abnormal findings: Secondary | ICD-10-CM | POA: Diagnosis not present

## 2023-01-12 NOTE — Patient Instructions (Signed)
Maria Oliver , Thank you for taking time to come for your Medicare Wellness Visit. I appreciate your ongoing commitment to your health goals. Please review the following plan we discussed and let me know if I can assist you in the future.   Referrals/Orders/Follow-Ups/Clinician Recommendations: Aim for 30 minutes of exercise or brisk walking, 6-8 glasses of water, and 5 servings of fruits and vegetables each day.   This is a list of the screening recommended for you and due dates:  Health Maintenance  Topic Date Due   COVID-19 Vaccine (1 - 2023-24 season) Never done   Zoster (Shingles) Vaccine (1 of 2) 04/14/2023*   Flu Shot  01/13/2023   Screening for Lung Cancer  09/07/2023   DTaP/Tdap/Td vaccine (2 - Td or Tdap) 10/08/2023   Pap Smear  11/14/2023   Medicare Annual Wellness Visit  01/12/2024   Mammogram  07/08/2024   Hepatitis C Screening  Completed   HIV Screening  Completed   HPV Vaccine  Aged Out   Colon Cancer Screening  Discontinued  *Topic was postponed. The date shown is not the original due date.    Advanced directives: (ACP Link)Information on Advanced Care Planning can be found at [facility] Advance Health Care Directives Advance Health Care Directives ([email or web address]   Next Medicare Annual Wellness Visit scheduled for next year: Yes   Preventive Care 40-64 Years, Female Preventive care refers to lifestyle choices and visits with your health care provider that can promote health and wellness. What does preventive care include? A yearly physical exam. This is also called an annual well check. Dental exams once or twice a year. Routine eye exams. Ask your health care provider how often you should have your eyes checked. Personal lifestyle choices, including: Daily care of your teeth and gums. Regular physical activity. Eating a healthy diet. Avoiding tobacco and drug use. Limiting alcohol use. Practicing safe sex. Taking low-dose aspirin daily  starting at age 54. Taking vitamin and mineral supplements as recommended by your health care provider. What happens during an annual well check? The services and screenings done by your health care provider during your annual well check will depend on your age, overall health, lifestyle risk factors, and family history of disease. Counseling  Your health care provider may ask you questions about your: Alcohol use. Tobacco use. Drug use. Emotional well-being. Home and relationship well-being. Sexual activity. Eating habits. Work and work Astronomer. Method of birth control. Menstrual cycle. Pregnancy history. Screening  You may have the following tests or measurements: Height, weight, and BMI. Blood pressure. Lipid and cholesterol levels. These may be checked every 5 years, or more frequently if you are over 75 years old. Skin check. Lung cancer screening. You may have this screening every year starting at age 30 if you have a 30-pack-year history of smoking and currently smoke or have quit within the past 15 years. Fecal occult blood test (FOBT) of the stool. You may have this test every year starting at age 38. Flexible sigmoidoscopy or colonoscopy. You may have a sigmoidoscopy every 5 years or a colonoscopy every 10 years starting at age 67. Hepatitis C blood test. Hepatitis B blood test. Sexually transmitted disease (STD) testing. Diabetes screening. This is done by checking your blood sugar (glucose) after you have not eaten for a while (fasting). You may have this done every 1-3 years. Mammogram. This may be done every 1-2 years. Talk to your health care provider about when you should start  having regular mammograms. This may depend on whether you have a family history of breast cancer. BRCA-related cancer screening. This may be done if you have a family history of breast, ovarian, tubal, or peritoneal cancers. Pelvic exam and Pap test. This may be done every 3 years starting  at age 54. Starting at age 72, this may be done every 5 years if you have a Pap test in combination with an HPV test. Bone density scan. This is done to screen for osteoporosis. You may have this scan if you are at high risk for osteoporosis. Discuss your test results, treatment options, and if necessary, the need for more tests with your health care provider. Vaccines  Your health care provider may recommend certain vaccines, such as: Influenza vaccine. This is recommended every year. Tetanus, diphtheria, and acellular pertussis (Tdap, Td) vaccine. You may need a Td booster every 10 years. Zoster vaccine. You may need this after age 31. Pneumococcal 13-valent conjugate (PCV13) vaccine. You may need this if you have certain conditions and were not previously vaccinated. Pneumococcal polysaccharide (PPSV23) vaccine. You may need one or two doses if you smoke cigarettes or if you have certain conditions. Talk to your health care provider about which screenings and vaccines you need and how often you need them. This information is not intended to replace advice given to you by your health care provider. Make sure you discuss any questions you have with your health care provider. Document Released: 06/27/2015 Document Revised: 02/18/2016 Document Reviewed: 04/01/2015 Elsevier Interactive Patient Education  2017 [employer].    Fall Prevention in the Home Falls can cause injuries. They can happen to people of all ages. There are many things you can do to make your home safe and to help prevent falls. What can I do on the outside of my home? Regularly fix the edges of walkways and driveways and fix any cracks. Remove anything that might make you trip as you walk through a door, such as a raised step or threshold. Trim any bushes or trees on the path to your home. Use bright outdoor lighting. Clear any walking paths of anything that might make someone trip, such as rocks or tools. Regularly check  to see if handrails are loose or broken. Make sure that both sides of any steps have handrails. Any raised decks and porches should have guardrails on the edges. Have any leaves, snow, or ice cleared regularly. Use sand or salt on walking paths during winter. Clean up any spills in your garage right away. This includes oil or grease spills. What can I do in the bathroom? Use night lights. Install grab bars by the toilet and in the tub and shower. Do not use towel bars as grab bars. Use non-skid mats or decals in the tub or shower. If you need to sit down in the shower, use a plastic, non-slip stool. Keep the floor dry. Clean up any water that spills on the floor as soon as it happens. Remove soap buildup in the tub or shower regularly. Attach bath mats securely with double-sided non-slip rug tape. Do not have throw rugs and other things on the floor that can make you trip. What can I do in the bedroom? Use night lights. Make sure that you have a light by your bed that is easy to reach. Do not use any sheets or blankets that are too big for your bed. They should not hang down onto the floor. Have a firm chair that  has side arms. You can use this for support while you get dressed. Do not have throw rugs and other things on the floor that can make you trip. What can I do in the kitchen? Clean up any spills right away. Avoid walking on wet floors. Keep items that you use a lot in easy-to-reach places. If you need to reach something above you, use a strong step stool that has a grab bar. Keep electrical cords out of the way. Do not use floor polish or wax that makes floors slippery. If you must use wax, use non-skid floor wax. Do not have throw rugs and other things on the floor that can make you trip. What can I do with my stairs? Do not leave any items on the stairs. Make sure that there are handrails on both sides of the stairs and use them. Fix handrails that are broken or loose. Make  sure that handrails are as long as the stairways. Check any carpeting to make sure that it is firmly attached to the stairs. Fix any carpet that is loose or worn. Avoid having throw rugs at the top or bottom of the stairs. If you do have throw rugs, attach them to the floor with carpet tape. Make sure that you have a light switch at the top of the stairs and the bottom of the stairs. If you do not have them, ask someone to add them for you. What else can I do to help prevent falls? Wear shoes that: Do not have high heels. Have rubber bottoms. Are comfortable and fit you well. Are closed at the toe. Do not wear sandals. If you use a stepladder: Make sure that it is fully opened. Do not climb a closed stepladder. Make sure that both sides of the stepladder are locked into place. Ask someone to hold it for you, if possible. Clearly mark and make sure that you can see: Any grab bars or handrails. First and last steps. Where the edge of each step is. Use tools that help you move around (mobility aids) if they are needed. These include: Canes. Walkers. Scooters. Crutches. Turn on the lights when you go into a dark area. Replace any light bulbs as soon as they burn out. Set up your furniture so you have a clear path. Avoid moving your furniture around. If any of your floors are uneven, fix them. If there are any pets around you, be aware of where they are. Review your medicines with your doctor. Some medicines can make you feel dizzy. This can increase your chance of falling. Ask your doctor what other things that you can do to help prevent falls. This information is not intended to replace advice given to you by your health care provider. Make sure you discuss any questions you have with your health care provider. Document Released: 03/27/2009 Document Revised: 11/06/2015 Document Reviewed: 07/05/2014 Elsevier Interactive Patient Education  2017 [employer].

## 2023-01-12 NOTE — Progress Notes (Signed)
Subjective:   Maria Oliver is a 62 y.o. female who presents for Medicare Annual (Subsequent) preventive examination.  Visit Complete: Virtual  I connected with  Maria Oliver on 01/12/23 by a audio enabled telemedicine application and verified that I am speaking with the correct person using two identifiers.  Patient Location: Home  Provider Location: Home Office  I discussed the limitations of evaluation and management by telemedicine. The patient expressed understanding and agreed to proceed.  Vital Signs: Per patient no change in vitals since last visit.  Review of Systems     Cardiac Risk Factors include: diabetes mellitus;hypertension;sedentary lifestyle;smoking/ tobacco exposure     Objective:    Today's Vitals   01/12/23 1955  Weight: 148 lb (67.1 kg)  Height: 5\' 4"  (1.626 m)   Body mass index is 25.4 kg/m.     01/12/2023    8:04 PM 12/07/2022   11:02 PM 07/24/2022    6:53 PM 05/12/2022    3:13 PM 03/08/2021    1:09 PM 02/23/2021   10:48 PM 02/22/2021    3:39 PM  Advanced Directives  Does Patient Have a Medical Advance Directive? No No No No No  No  Would patient like information on creating a medical advance directive? Yes (MAU/Ambulatory/Procedural Areas - Information given)  No - Patient declined Yes (ED - Information included in AVS) Yes (ED - Information included in AVS) Yes (Inpatient - patient defers creating a medical advance directive and declines information at this time)     Current Medications (verified) Outpatient Encounter Medications as of 01/12/2023  Medication Sig   acetaminophen (TYLENOL) 500 MG tablet Take 500-1,000 mg by mouth daily as needed for headache, mild pain or fever.   albuterol (VENTOLIN HFA) 108 (90 Base) MCG/ACT inhaler Inhale 2 puffs into the lungs every 6 (six) hours as needed for wheezing or shortness of breath.   amLODipine (NORVASC) 5 MG tablet Take 1 tablet (5 mg total) by mouth daily.   cimetidine (TAGAMET) 200 MG tablet  Take 200 mg by mouth 2 (two) times daily as needed (stomach pain).   Fluticasone-Umeclidin-Vilant (TRELEGY ELLIPTA) 100-62.5-25 MCG/ACT AEPB Inhale 1 puff into the lungs daily.   ibuprofen (ADVIL) 400 MG tablet Take 1 tablet (400 mg total) by mouth every 8 (eight) hours as needed for up to 21 doses for mild pain or moderate pain. Take with food   lisinopril (ZESTRIL) 20 MG tablet TAKE 1 TABLET(20 MG) BY MOUTH DAILY   Misc. Devices MISC Pull ups - Large.  Wipes Diagnosis- incontinence   Multiple Vitamin (MULTIVITAMIN WITH MINERALS) TABS tablet Take 1 tablet by mouth daily.   pantoprazole (PROTONIX) 40 MG tablet Take 1 tablet (40 mg total) by mouth daily.   polyethylene glycol powder (MIRALAX) 17 GM/SCOOP powder Take 17 g by mouth daily.   rosuvastatin (CRESTOR) 5 MG tablet TAKE 1 TABLET(5 MG) BY MOUTH DAILY   nicotine (NICODERM CQ) 14 mg/24hr patch Place 1 patch (14 mg total) onto the skin daily. For 4 weeks then switch to 7 mg per 24 hours thereafter (Patient not taking: Reported on 01/12/2023)   No facility-administered encounter medications on file as of 01/12/2023.    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 09/09/2016   Generalized headaches    Hernia, inguinal, right    Hypertension    Leg swelling    Past Surgical History:  Procedure Laterality Date   COLON SURGERY  2010   per patient "colon take down"   COLOSTOMY  2010   LAPAROSCOPIC CHOLECYSTECTOMY SINGLE SITE WITH INTRAOPERATIVE  CHOLANGIOGRAM N/A 09/09/2016   Procedure: LAPAROSCOPIC CHOLECYSTECTOMY  WITH INTRAOPERATIVE CHOLANGIOGRAM;  Surgeon: Karie Soda, MD;  Location: [facility];  Service: General;  Laterality: N/A;   TUBAL LIGATION     Family History  Problem Relation Age of Onset   Heart disease Mother 68   Heart failure Mother    Asthma Mother    Emphysema Mother    Alzheimer's disease Father 15   Emphysema Brother    Asthma Brother    Colon cancer Neg Hx    Rectal cancer Neg Hx    Esophageal cancer Neg Hx    Pancreatic cancer Neg Hx    Social History   Socioeconomic History   Marital status: Widowed    Spouse name: Not on file   Number of children: 3   Years of education: Not on file   Highest education level: Not on file  Occupational History   Occupation: disabled  Tobacco Use   Smoking status: Every Day    Current packs/day: 1.00    Average packs/day: 1 pack/day for 47.0 years (47.0 ttl pk-yrs)    Types: Cigarettes   Smokeless tobacco: Never   Tobacco comments:    1/2 ppd 02/25/20   Vaping Use   Vaping status: Never Used  Substance and Sexual Activity   Alcohol use: No    Alcohol/week: 0.0 standard drinks of alcohol   Drug use: No   Sexual activity: Not Currently    Birth control/protection: Surgical, None  Other Topics Concern   Not on file  Social History Narrative   Not on file   Social Determinants of Health   Financial Resource Strain: Low Risk  (01/12/2023)   Overall Financial Resource Strain (CARDIA)    Difficulty of Paying Living Expenses: Not hard at all  Food Insecurity: No Food Insecurity (01/12/2023)   Hunger Vital Sign    Worried About Running Out of Food in the Last Year: Never true    Ran Out of Food in the Last Year: Never true  Transportation Needs: No Transportation Needs (01/12/2023)   PRAPARE - Administrator, Civil Service (Medical): No    Lack of Transportation (Non-Medical): No  Physical Activity: Inactive (01/12/2023)   Exercise Vital Sign     Days of Exercise per Week: 0 days    Minutes of Exercise per Session: 0 min  Stress: No Stress Concern Present (01/12/2023)   [employer] of Occupational Health - Occupational Stress Questionnaire    Feeling of Stress : Not at all  Social Connections: Socially Isolated (01/12/2023)   Social Connection and Isolation Panel [NHANES]    Frequency of Communication with Friends and Family: More than three times a week    Frequency of Social Gatherings with Friends and Family: More than three times a week    Attends Religious Services: Never    Database administrator or Organizations: No    Attends Banker Meetings: Never  Marital Status: Widowed    Tobacco Counseling Ready to quit: Not Answered Counseling given: Not Answered Tobacco comments: 1/2 ppd 02/25/20    Clinical Intake:  Pre-visit preparation completed: Yes  Pain : No/denies pain     Diabetes: No  How often do you need to have someone help you when you read instructions, pamphlets, or other written materials from your doctor or pharmacy?: 1 - Never  Interpreter Needed?: No  Information entered by :: Kandis Fantasia LPN   Activities of Daily Living    01/12/2023    8:03 PM 07/25/2022    1:37 PM  In your present state of health, do you have any difficulty performing the following activities:  Hearing? 0 0  Vision? 0 0  Difficulty concentrating or making decisions? 0 0  Walking or climbing stairs? 0 0  Dressing or bathing? 0 0  Doing errands, shopping? 0 0  Preparing Food and eating ? N   Using the Toilet? N   In the past six months, have you accidently leaked urine? N   Do you have problems with loss of bowel control? N   Managing your Medications? N   Managing your Finances? N   Housekeeping or managing your Housekeeping? N     Patient Care Team: Hoy Register, MD as PCP - General (Family Medicine) Jeani Hawking, MD as Consulting Physician (Gastroenterology) Karie Soda, MD as  Consulting Physician (General Surgery) Bradly Bienenstock, MD as Consulting Physician (Orthopedic Surgery) Lynann Bologna, MD as Consulting Physician (Gastroenterology)  Indicate any recent Medical Services you may have received from other than [facility] providers in the past year (date may be approximate).     Assessment:   This is a routine wellness examination for Stephanie.  Hearing/Vision screen Hearing Screening - Comments:: Denies hearing difficulties   Vision Screening - Comments:: No vision problems; will schedule routine eye exam soon    Dietary issues and exercise activities discussed:     Goals Addressed             This Visit's Progress    Remain active and independent        Depression Screen    01/12/2023    8:00 PM 10/11/2022    9:11 AM 05/27/2022    3:15 PM 05/12/2022    3:12 PM 08/25/2021    1:57 PM 05/26/2021    4:18 PM 03/11/2021    3:52 PM  PHQ 2/9 Scores  PHQ - 2 Score 2 2 3 4 2 2 4   PHQ- 9 Score 5 5 7 10 5 5 11     Fall Risk    01/12/2023    8:02 PM 10/11/2022    9:11 AM 05/27/2022    3:15 PM 05/12/2022    3:13 PM 03/23/2022   11:48 AM  Fall Risk   Falls in the past year? 0 0 0 0 0  Number falls in past yr: 0 0 0 0 0  Injury with Fall? 0 0 0 0 0  Risk for fall due to : No Fall Risks No Fall Risks No Fall Risks No Fall Risks No Fall Risks  Follow up Falls prevention discussed;Education provided;Falls evaluation completed        MEDICARE RISK AT HOME:  Medicare Risk at Home - 01/12/23 2002     Any stairs in or around the home? No    If so, are there any without handrails? No    Home free of loose throw rugs in walkways, pet beds, electrical  cords, etc? Yes    Adequate lighting in your home to reduce risk of falls? Yes    Life alert? No    Use of a cane, walker or w/c? No    Grab bars in the bathroom? Yes    Shower chair or bench in shower? No    Elevated toilet seat or a handicapped toilet? No             TIMED UP AND GO:  Was the test  performed?  No    Cognitive Function:        01/12/2023    8:03 PM 03/08/2021    1:06 PM  6CIT Screen  What Year? 0 points 0 points  What month? 0 points 0 points  What time? 0 points 0 points  Count back from 20 0 points 0 points  Months in reverse 2 points 0 points  Repeat phrase 0 points 2 points  Total Score 2 points 2 points    Immunizations Immunization History  Administered Date(s) Administered   Tdap 10/07/2013    TDAP status: Up to date  Pneumococcal vaccine status: Up to date  Covid-19 vaccine status: Information provided on how to obtain vaccines.   Qualifies for Shingles Vaccine? Yes   Zostavax completed No   Shingrix Completed?: No.    Education has been provided regarding the importance of this vaccine. Patient has been advised to call insurance company to determine out of pocket expense if they have not yet received this vaccine. Advised may also receive vaccine at local pharmacy or Health Dept. Verbalized acceptance and understanding.  Screening Tests Health Maintenance  Topic Date Due   COVID-19 Vaccine (1 - 2023-24 season) Never done   Zoster Vaccines- Shingrix (1 of 2) 04/14/2023 (Originally 08/07/2008)   INFLUENZA VACCINE  01/13/2023   Lung Cancer Screening  09/07/2023   DTaP/Tdap/Td (2 - Td or Tdap) 10/08/2023   PAP SMEAR-Modifier  11/14/2023   Medicare Annual Wellness (AWV)  01/12/2024   MAMMOGRAM  07/08/2024   Hepatitis C Screening  Completed   HIV Screening  Completed   HPV VACCINES  Aged Out   Colonoscopy  Discontinued    Health Maintenance  Health Maintenance Due  Topic Date Due   COVID-19 Vaccine (1 - 2023-24 season) Never done    Colorectal cancer screening: No longer required.   Mammogram status: Completed 07/08/22. Repeat every year  Lung Cancer Screening: (Low Dose CT Chest recommended if Age 68-80 years, 20 pack-year currently smoking OR have quit w/in 15years.) does qualify.   Lung Cancer Screening Referral: last  09/07/22  Additional Screening:  Hepatitis C Screening: does qualify; Completed 11/13/20  Vision Screening: Recommended annual ophthalmology exams for early detection of glaucoma and other disorders of the eye. Is the patient up to date with their annual eye exam?  No  Who is the provider or what is the name of the office in which the patient attends annual eye exams? none If pt is not established with a provider, would they like to be referred to a provider to establish care? No .   Dental Screening: Recommended annual dental exams for proper oral hygiene  Community Resource Referral / Chronic Care Management: CRR required this visit?  No   CCM required this visit?  No     Plan:     I have personally reviewed and noted the following in the patient's chart:   Medical and social history Use of alcohol, tobacco or illicit drugs  Current  medications and supplements including opioid prescriptions. Patient is not currently taking opioid prescriptions. Functional ability and status Nutritional status Physical activity Advanced directives List of other physicians Hospitalizations, surgeries, and ER visits in previous 12 months Vitals Screenings to include cognitive, depression, and falls Referrals and appointments  In addition, I have reviewed and discussed with patient certain preventive protocols, quality metrics, and best practice recommendations. A written personalized care plan for preventive services as well as general preventive health recommendations were provided to patient.     Kandis Fantasia [place], [place]   1/61/0960   After Visit Summary: (MyChart) Due to this being a telephonic visit, the after visit summary with patients personalized plan was offered to patient via MyChart   Nurse Notes: No concerns at this time

## 2023-01-14 ENCOUNTER — Telehealth: Payer: Self-pay | Admitting: Gastroenterology

## 2023-01-14 NOTE — Telephone Encounter (Signed)
Patient called in with concerns for diverticulitis. CT from June had showed diverticulosis. She states she's having constant, lower abdominal pain for some time now (7/10). Takes tylenol daily. Denies any nausea/vomiting. Normal bowel movements. She said she followed up with Dr. Michaell Cowing who is not recommending surgery at this time for her hiatal hernia since she currently smokes and it places her at higher risk. Patient was last seen with Dr. Chales Abrahams on 12/09/22. She is requesting antibiotics & pain medication at this time. Discussed ED precautions in the meantime if needed.

## 2023-01-14 NOTE — Telephone Encounter (Signed)
Inbound call from patient inquiring about symptoms of diverticulitis. States she would like to have a prescription sent into the pharmacy. Please advise.   Thank you

## 2023-01-21 ENCOUNTER — Other Ambulatory Visit: Payer: Self-pay

## 2023-01-21 DIAGNOSIS — R1084 Generalized abdominal pain: Secondary | ICD-10-CM

## 2023-01-21 NOTE — Telephone Encounter (Signed)
Not sure if it is entirely " diverticulitis" If still with problems-significant abdominal pain -Check CBC, CMP, CRP, lipase -Repeat CT Abdo/pelvis with contrast  RG

## 2023-01-21 NOTE — Telephone Encounter (Signed)
Spoke with patient & she is still having intermittent abdominal pain. She did invest in a hiatal hernia belt which has provided some relief, but still having pain in her lower abdomen. She plans to come in on Monday for lab work. Advised her on when/where to go. CT scheduled for 01/27/23 at 5:30 pm, arrive by 5:00 pm at [facility]. Pt provided radiology scheduling number in case she needs to reschedule. Pt advised to call back with any further questions/concerns. Pt verbalized all understanding.

## 2023-01-25 ENCOUNTER — Other Ambulatory Visit (INDEPENDENT_AMBULATORY_CARE_PROVIDER_SITE_OTHER): Payer: 59

## 2023-01-25 DIAGNOSIS — R1084 Generalized abdominal pain: Secondary | ICD-10-CM | POA: Diagnosis not present

## 2023-01-25 LAB — COMPREHENSIVE METABOLIC PANEL
ALT: 14 U/L (ref 0–35)
AST: 15 U/L (ref 0–37)
Albumin: 4.3 g/dL (ref 3.5–5.2)
Alkaline Phosphatase: 72 U/L (ref 39–117)
BUN: 10 mg/dL (ref 6–23)
CO2: 32 mEq/L (ref 19–32)
Calcium: 9.4 mg/dL (ref 8.4–10.5)
Chloride: 102 mEq/L (ref 96–112)
Creatinine, Ser: 0.83 mg/dL (ref 0.40–1.20)
GFR: 74.48 mL/min (ref >60.00)
Glucose, Bld: 101 mg/dL — ABNORMAL HIGH (ref 70–99)
Potassium: 4.2 mEq/L (ref 3.5–5.1)
Sodium: 141 mEq/L (ref 135–145)
Total Bilirubin: 0.3 mg/dL (ref 0.2–1.2)
Total Protein: 6.9 g/dL (ref 6.0–8.3)

## 2023-01-25 LAB — C-REACTIVE PROTEIN: CRP: <1 mg/dL (ref 0.5–20.0)

## 2023-01-25 LAB — LIPASE: Lipase: 16 U/L (ref 11.0–59.0)

## 2023-01-25 LAB — CBC
HCT: 45.9 % (ref 36.0–46.0)
Hemoglobin: 14.9 g/dL (ref 12.0–15.0)
MCHC: 32.6 g/dL (ref 30.0–36.0)
MCV: 86 fl (ref 78.0–100.0)
Platelets: 340 10*3/uL (ref 150.0–400.0)
RBC: 5.34 Mil/uL — ABNORMAL HIGH (ref 3.87–5.11)
RDW: 15 % (ref 11.5–15.5)
WBC: 9.6 10*3/uL (ref 4.0–10.5)

## 2023-01-27 ENCOUNTER — Ambulatory Visit (HOSPITAL_COMMUNITY)
Admission: RE | Admit: 2023-01-27 | Discharge: 2023-01-27 | Disposition: A | Discharge status: Home / Self Care | Payer: 59 | Source: Ambulatory Visit | Attending: Gastroenterology | Admitting: Gastroenterology

## 2023-01-27 DIAGNOSIS — R1084 Generalized abdominal pain: Secondary | ICD-10-CM | POA: Diagnosis present

## 2023-01-27 MED ORDER — IOHEXOL 300 MG/ML  SOLN
100.0000 mL | Freq: Once | INTRAMUSCULAR | Status: AC | PRN
  Administered 2023-01-27: 100 mL via INTRAVENOUS

## 2023-01-31 ENCOUNTER — Encounter: Payer: Self-pay | Admitting: Nurse Practitioner

## 2023-01-31 ENCOUNTER — Ambulatory Visit: Payer: 59 | Admitting: Nurse Practitioner

## 2023-01-31 VITALS — BP 126/80 | HR 80 | Temp 98.3°F | Ht 64.0 in | Wt 147.8 lb

## 2023-01-31 DIAGNOSIS — F17210 Nicotine dependence, cigarettes, uncomplicated: Secondary | ICD-10-CM

## 2023-01-31 DIAGNOSIS — J432 Centrilobular emphysema: Secondary | ICD-10-CM | POA: Diagnosis not present

## 2023-01-31 DIAGNOSIS — Z720 Tobacco use: Secondary | ICD-10-CM

## 2023-01-31 MED ORDER — TRELEGY ELLIPTA 100-62.5-25 MCG/ACT IN AEPB: 1.0000 | INHALATION_SPRAY | Freq: Every day | RESPIRATORY_TRACT | 6 refills | Status: DC

## 2023-01-31 NOTE — Patient Instructions (Addendum)
Continue Albuterol inhaler 2 puffs or 3 mL neb every 6 hours as needed for shortness of breath or wheezing. Notify if symptoms persist despite rescue inhaler/neb use.  Restart Trelegy 1 puff daily. Brush tongue and rinse mouth afterwards Continue pantoprazole 1 daily   Referral to lung cancer screening program  Follow up in 6 weeks with Dr. Judeth Horn (1st) or Katie Shaan Rhoads,NP. If symptoms do not improve or worsen, please contact office for sooner follow up or seek emergency care.

## 2023-01-31 NOTE — Assessment & Plan Note (Signed)
1/2 ppd. Smoking cessation strongly advised. She is not ready to quit at this time. Encouraged her to let us know if we can be of assistance.

## 2023-01-31 NOTE — Assessment & Plan Note (Signed)
Moderate COPD. No recent exacerbations or hospitalizations. Currently off maintenance therapies with increased dyspnea and decreased activity tolerance. Does not appear to be in acute exacerbation. Will restart her on Trelegy - provided with samples and new rx placed. Medication education and teachback performed. Action plan in place. Smoking cessation strongly advised.   Patient Instructions  Continue Albuterol inhaler 2 puffs or 3 mL neb every 6 hours as needed for shortness of breath or wheezing. Notify if symptoms persist despite rescue inhaler/neb use.  Restart Trelegy 1 puff daily. Brush tongue and rinse mouth afterwards Continue pantoprazole 1 daily   Referral to lung cancer screening program  Follow up in 6 weeks with Dr. Judeth Horn (1st) or Katie Ankith [place],NP. If symptoms do not improve or worsen, please contact office for sooner follow up or seek emergency care.

## 2023-01-31 NOTE — Progress Notes (Signed)
@Patient  ID: Maria Oliver, female    DOB: [birth year], 62 y.o.   MRN: [id number]  Chief Complaint  Patient presents with   Follow-up    Pt is here for COPD F/U visit today. Review Lung X-Ray. Pt states she always has some SOB everyday.    Referring provider: Hoy Register, MD  HPI: 62 year old female, current smoker (47 pack years) followed for COPD with emphysema and asthma.  Patient of Dr. Laurena Spies and last seen via virtual visit by Katie Julitza Rickles,NP 05/11/2021.  Past medical history significant for hypertension, chronic pain syndrome, and anxiety.    TESTS/EVENTS: 07/29/2020 LDCT: Lung RADS 2, benign appearance or behavior. Centrilobular and paraseptal emphysema. Unchanged chronic scarring in lingula. New geographic ares of groundglass attenuation both lower lungs favored to represent sequelae of infection or inflammation.  02/22/2021 CTA chest/abd/pel for dissection: no evidence of thoracic aortic aneurysm or dissection. Arthrosclerotic calcifications of the aortic arch. No PE noted. Heart size normal. No lymphadenopathy. 8 mm right thyroid nodule not clinically sig with no f/u recommended. Mild centrilobular and paraseptal emphysematous changes, upper lung predominant. Scarring/atelectasis in the lingula and bilat lower lobes. 15 mm ground-glass nodule in the posterior right upper lobe unchanged from previous exam. Additional scatter ground-glass nodules are unchanged and better evaluated on LDCT. No evidence of abd aortic aneurysm or dissection. Large lower midline ventral hernia without associated inflammatory changes. No evidence of bowel obstruction.  05/06/2021 CXR 2 View: Hyperinflation without acute cardiopulmonary disease. Scarring present left lung base.  09/07/2022 CT chest w contrast: minimal pericardial effusion. Atherosclerosis. Esophagus decompressed. Emphysematous changes in lungs. Atelectasis in lung bases. Focal area of patchy peribronchovascular ground glass in RUL, 1.7 cm  and unchanged.    09/05/2020: Virtual visit with Ames Dura, NP.  Productive cough with thick yellow mucus x2 days.  Shoulder pain, chest tightness and wheezing.  Increased rescue inhaler use.  COPD exacerbation.  Rx doxycycline x7 days and prednisone taper.  Mucinex twice daily.  Continue Advair twice daily.   05/06/2021: ED visit. Cough, congestion, headaches, dizziness and nausea x 2 days. CT head neg. RVP neg. CXR hyperinflation without acute illness/disease. Diagnosed with URI and discharged with rx for zofran.   05/11/2021: OV with Warda Mcqueary NP via virtual visit for reported cough, congestion, and headaches about six days ago. Her symptoms have persisted and have not improved despite being treated in the ED on 11/23. She reports yellow sputum production and yellow rhinorrhea. She has also been experiencing intermittent episodes of dizziness and nausea. No vomiting or syncope. Recent CT head negative. She denies shortness of breath, wheezing, visual disturbances, orthopnea, PND, CP, or leg swelling. No fever, no chills, or body aches. She has been taking tylenol as needed for headaches with moderate relief. She was prescribed zofran in the ED for nausea but has not been using it. Continues on Advair twice daily. She is utilizing her rescue inhaler maybe once a day. Overall, she feels poorly but reports that her breathing is stable.   01/31/2023: Today - overdue follow up Patient presents today for overdue follow up. She has been doing okay since her last visit. She does feel like her breathing limits her with longer distance walking and she can't seem to keep up with her son as well as she used to. She's able to complete ADLs, grocery shop and household chores without trouble. She has a daily chronic cough, which is occasionally productive with white phlegm. No wheezing or chest congestion. She ran out of  her maintenance inhalers a while ago. She's been using albuterol a few times a week, sometimes  daily. She is still smoking. Not ready to quit due to life stressors. No hemoptysis, anorexia, weight loss.   Allergies  Allergen Reactions   Aspirin Nausea Only and Other (See Comments)    GI upset/Hernia     Ciprofloxacin Nausea Only and Other (See Comments)    Tendonitis, joint pain, and nausea.  "Interfered with her HCTZ" (also)   Codeine Nausea And Vomiting and Other (See Comments)    (Also) GI upset/Hernia (CAN TOLERATE DILAUDID & MORPHINE)   Egg-Derived Products Nausea Only    Stomach pains   Fentanyl Nausea Only and Other (See Comments)    Tolerates Dilaudid or morphine much better   Hydrocodone Nausea And Vomiting   Lactose Intolerance (Gi) Nausea And Vomiting and Other (See Comments)    (Also) vertigo   Oxycodone Nausea And Vomiting    Tolerates hydromorphone better   Penicillin G Rash   Penicillins Rash    Has patient had a PCN reaction causing immediate rash, facial/tongue/throat swelling, SOB or lightheadedness with hypotension: Yes Has patient had a PCN reaction causing severe rash involving mucus membranes or skin necrosis: No Has patient had a PCN reaction that required hospitalization No Has patient had a PCN reaction occurring within the last 10 years: No If all of the above answers are "NO", then may proceed with Cephalosporin use.    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 09/09/2016   Generalized headaches    Hernia, inguinal, right    Hypertension    Leg swelling     Tobacco History: Social  History   Tobacco Use  Smoking Status Every Day   Current packs/day: 1.00   Average packs/day: 1 pack/day for 47.0 years (47.0 ttl pk-yrs)   Types: Cigarettes  Smokeless Tobacco Never  Tobacco Comments   1/2 ppd 02/25/20    Ready to quit: Not Answered Counseling given: Not Answered Tobacco comments: 1/2 ppd 02/25/20    Outpatient Medications Prior to Visit  Medication Sig Dispense Refill   acetaminophen (TYLENOL) 500 MG tablet Take 500-1,000 mg by mouth daily as needed for headache, mild pain or fever.     albuterol (VENTOLIN HFA) 108 (90 Base) MCG/ACT inhaler Inhale 2 puffs into the lungs every 6 (six) hours as needed for wheezing or shortness of breath. 25.5 g 1   amLODipine (NORVASC) 5 MG tablet Take 1 tablet (5 mg total) by mouth daily. 30 tablet 0   lisinopril (ZESTRIL) 20 MG tablet TAKE 1 TABLET(20 MG) BY MOUTH DAILY 90 tablet 1   Misc. Devices MISC Pull ups - Large.  Wipes Diagnosis- incontinence 1 Box 0   Multiple Vitamin (MULTIVITAMIN WITH MINERALS) TABS tablet Take 1 tablet by mouth daily.     pantoprazole (PROTONIX) 40 MG tablet Take 1 tablet (40 mg total) by mouth daily. 30 tablet 0   polyethylene glycol powder (MIRALAX) 17 GM/SCOOP powder Take 17  g by mouth daily. 510 g 0   rosuvastatin (CRESTOR) 5 MG tablet TAKE 1 TABLET(5 MG) BY MOUTH DAILY 30 tablet 0   ibuprofen (ADVIL) 400 MG tablet Take 1 tablet (400 mg total) by mouth every 8 (eight) hours as needed for up to 21 doses for mild pain or moderate pain. Take with food 21 tablet 0   cimetidine (TAGAMET) 200 MG tablet Take 200 mg by mouth 2 (two) times daily as needed (stomach pain).     Fluticasone-Umeclidin-Vilant (TRELEGY ELLIPTA) 100-62.5-25 MCG/ACT AEPB Inhale 1 puff into the lungs daily. (Patient not taking: Reported on 01/31/2023) 1 each 3   nicotine (NICODERM CQ) 14 mg/24hr patch Place 1 patch (14 mg total) onto the skin daily. For 4 weeks then switch to 7 mg per 24 hours thereafter (Patient not taking: Reported on  01/12/2023) 28 patch 3   No facility-administered medications prior to visit.     Review of Systems:   Constitutional: No weight loss or gain, night sweats, fevers, chills, or lassitude. +fatigue (baseline) HEENT: No headaches, difficulty swallowing, tooth/dental problems, or sore throat. No sneezing, itching, ear ache, nasal congestion, or post nasal drip CV:  No chest pain, orthopnea, PND, swelling in lower extremities, anasarca, dizziness, palpitations, syncope Resp: +shortness of breath with exertion; daily cough. No excess mucus or change in color of mucus. No hemoptysis. No wheezing.  No chest wall deformity GI:  No heartburn, indigestion GU: No dysuria, change in color of urine, urgency or frequency.   Skin: No rash, lesions, ulcerations MSK:  No joint pain or swelling.   Neuro: No dizziness or lightheadedness.  Psych: No depression or anxiety. Mood stable.     Physical Exam:  BP 126/80 (BP Location: Right Arm, Cuff Size: Normal)   Pulse 80   Temp 98.3 F (36.8 C) (Oral)   Ht 5\' 4"  (1.626 m)   Wt 147 lb 12.8 oz (67 kg)   SpO2 94%   BMI 25.37 kg/m   GEN: Pleasant, interactive, well-kempt; in no acute distress. HEENT:  Normocephalic and atraumatic. PERRLA. Sclera white. Nasal turbinates pink, moist and patent bilaterally. No rhinorrhea present. Oropharynx pink and moist, without exudate or edema. No lesions, ulcerations, or postnasal drip.  NECK:  Supple w/ fair ROM. No JVD present. Normal carotid impulses w/o bruits. Thyroid symmetrical with no goiter or nodules palpated. No lymphadenopathy.   CV: RRR, no m/r/g, no peripheral edema. Pulses intact, +2 bilaterally. No cyanosis, pallor or clubbing. PULMONARY:  Unlabored, regular breathing. Diminished bibasilar airflow otherwise clear bilaterally A&P w/o wheezes/rales/rhonchi. No accessory muscle use.  GI: BS present and normoactive. Soft, non-tender to palpation. No organomegaly or masses detected. MSK: No erythema, warmth or  tenderness. Cap refil <2 sec all extrem. No deformities or joint swelling noted.  Neuro: A/Ox3. No focal deficits noted.   Skin: Warm, no lesions or rashe Psych: Normal affect and behavior. Judgement and thought content appropriate.     Lab Results:  CBC    Component Value Date/Time   WBC 9.6 01/25/2023 1519   RBC 5.34 (H) 01/25/2023 1519   HGB 14.9 01/25/2023 1519   HGB 15.0 05/12/2022 1549   HCT 45.9 01/25/2023 1519   HCT 47.7 (H) 05/12/2022 1549   PLT 340.0 01/25/2023 1519   PLT 393 05/12/2022 1549   MCV 86.0 01/25/2023 1519   MCV 88 05/12/2022 1549   MCH 27.6 12/07/2022 2355   MCHC 32.6 01/25/2023 1519   RDW 15.0 01/25/2023 1519   RDW 13.2 05/12/2022  1549   LYMPHSABS 3.0 07/27/2022 0411   LYMPHSABS 3.7 (H) 05/12/2022 1549   MONOABS 0.7 07/27/2022 0411   EOSABS 0.7 (H) 07/27/2022 0411   EOSABS 0.6 (H) 05/12/2022 1549   BASOSABS 0.1 07/27/2022 0411   BASOSABS 0.1 05/12/2022 1549    BMET    Component Value Date/Time   NA 141 01/25/2023 1519   NA 140 05/12/2022 1549   K 4.2 01/25/2023 1519   CL 102 01/25/2023 1519   CO2 32 01/25/2023 1519   GLUCOSE 101 (H) 01/25/2023 1519   BUN 10 01/25/2023 1519   BUN 12 05/12/2022 1549   CREATININE 0.83 01/25/2023 1519   CREATININE 0.67 08/11/2015 1054   CALCIUM 9.4 01/25/2023 1519   GFRNONAA >60 12/07/2022 2355   GFRNONAA >89 08/11/2015 1054   GFRAA >60 01/09/2020 2253   GFRAA >89 08/11/2015 1054    BNP    Component Value Date/Time   BNP 165.0 (H) 02/24/2021 0224     Imaging:  No results found.  Administration History     None          Latest Ref Rng & Units 08/02/2014    8:39 AM  PFT Results  FVC-Pre L 2.47   FVC-Predicted Pre % 75   FVC-Post L 2.76   FVC-Predicted Post % 83   Pre FEV1/FVC % % 66   Post FEV1/FCV % % 71   FEV1-Pre L 1.63   FEV1-Predicted Pre % 63   FEV1-Post L 1.95   DLCO uncorrected ml/min/mmHg 12.42   DLCO UNC% % 54   DLCO corrected ml/min/mmHg 12.50   DLCO COR %Predicted %  54   DLVA Predicted % 62   TLC L 5.72   TLC % Predicted % 116   RV % Predicted % 180     No results found for: "NITRICOXIDE"      Assessment & Plan:   COPD (chronic obstructive pulmonary disease) with emphysema (HCC) Moderate COPD. No recent exacerbations or hospitalizations. Currently off maintenance therapies with increased dyspnea and decreased activity tolerance. Does not appear to be in acute exacerbation. Will restart her on Trelegy - provided with samples and new rx placed. Medication education and teachback performed. Action plan in place. Smoking cessation strongly advised.   Patient Instructions  Continue Albuterol inhaler 2 puffs or 3 mL neb every 6 hours as needed for shortness of breath or wheezing. Notify if symptoms persist despite rescue inhaler/neb use.  Restart Trelegy 1 puff daily. Brush tongue and rinse mouth afterwards Continue pantoprazole 1 daily   Referral to lung cancer screening program  Follow up in 6 weeks with Dr. Judeth Horn (1st) or Katie Myeesha Shane,NP. If symptoms do not improve or worsen, please contact office for sooner follow up or seek emergency care.    Tobacco abuse 1/2 ppd. Smoking cessation strongly advised. She is not ready to quit at this time. Encouraged her to let us know if we can be of assistance.    I spent 35 minutes of dedicated to the care of this patient on the date of this encounter to include pre-visit review of records, face-to-face time with the patient discussing conditions above, post visit ordering of testing, clinical documentation with the electronic health record, making appropriate referrals as documented, and communicating necessary findings to members of the patients care team.  Noemi Chapel, NP 01/31/2023  Pt aware and understands NP's role.

## 2023-02-02 ENCOUNTER — Telehealth: Payer: Self-pay | Admitting: Gastroenterology

## 2023-02-02 NOTE — Telephone Encounter (Signed)
Inbound call from patient requesting a call to discuss results from 8/15 CT scan. Please advise.

## 2023-02-02 NOTE — Telephone Encounter (Signed)
Pt requesting results from recent CT scan. Pt was notified that the report has not been created by the radiologist yet and then Dr Chales Abrahams will review it  and then  we will make her aware of the results and any recommendations: Pt verbalized understanding with all questions answered.

## 2023-02-09 ENCOUNTER — Telehealth: Payer: Self-pay | Admitting: Family Medicine

## 2023-02-09 NOTE — Telephone Encounter (Signed)
Kirsten from [employer] # [phone] Fax # [phone]   Caller checking on the status of forms faxed on  8/15 to [phone] for incontinence supplies. Caller will re fax today 8/28, please document when received. Informed caller please allow 5-7 business day to process.

## 2023-02-11 NOTE — Telephone Encounter (Signed)
Pt states that she had a phone call from [facility] yesterday regarding her incontinence supplies. Pt is needing a new prescription sent to [facility] for size medium in incontinence supplies due to there size large being too big.  Please advise.

## 2023-02-11 NOTE — Telephone Encounter (Signed)
Okay to write Prescription. Thanks

## 2023-02-16 ENCOUNTER — Other Ambulatory Visit: Payer: Self-pay

## 2023-02-16 MED ORDER — MISC. DEVICES MISC: 0 refills | Status: AC

## 2023-02-16 NOTE — Telephone Encounter (Signed)
Script has been written and will be faxed once signed.

## 2023-03-14 ENCOUNTER — Ambulatory Visit: Payer: 59 | Admitting: Nurse Practitioner

## 2023-03-21 ENCOUNTER — Ambulatory Visit: Payer: Self-pay

## 2023-03-21 ENCOUNTER — Other Ambulatory Visit: Payer: Self-pay

## 2023-03-21 ENCOUNTER — Encounter (HOSPITAL_COMMUNITY): Payer: Self-pay

## 2023-03-21 ENCOUNTER — Emergency Department (HOSPITAL_COMMUNITY)
Admission: EM | Admit: 2023-03-21 | Discharge: 2023-03-21 | Disposition: A | Discharge status: Home / Self Care | Payer: 59 | Attending: Emergency Medicine | Admitting: Emergency Medicine

## 2023-03-21 ENCOUNTER — Emergency Department (HOSPITAL_COMMUNITY): Payer: 59

## 2023-03-21 DIAGNOSIS — J069 Acute upper respiratory infection, unspecified: Secondary | ICD-10-CM | POA: Insufficient documentation

## 2023-03-21 DIAGNOSIS — Z79899 Other long term (current) drug therapy: Secondary | ICD-10-CM | POA: Insufficient documentation

## 2023-03-21 DIAGNOSIS — J449 Chronic obstructive pulmonary disease, unspecified: Secondary | ICD-10-CM | POA: Diagnosis not present

## 2023-03-21 DIAGNOSIS — R059 Cough, unspecified: Secondary | ICD-10-CM | POA: Diagnosis present

## 2023-03-21 DIAGNOSIS — Z20822 Contact with and (suspected) exposure to covid-19: Secondary | ICD-10-CM | POA: Diagnosis not present

## 2023-03-21 LAB — RESP PANEL BY RT-PCR (RSV, FLU A&B, COVID)  RVPGX2
Influenza A by PCR: NEGATIVE
Influenza B by PCR: NEGATIVE
Resp Syncytial Virus by PCR: NEGATIVE
SARS Coronavirus 2 by RT PCR: NEGATIVE

## 2023-03-21 LAB — COMPREHENSIVE METABOLIC PANEL
ALT: 18 U/L (ref 0–44)
AST: 17 U/L (ref 15–41)
Albumin: 3.6 g/dL (ref 3.5–5.0)
Alkaline Phosphatase: 77 U/L (ref 38–126)
Anion gap: 10 (ref 5–15)
BUN: <5 mg/dL — ABNORMAL LOW (ref 8–23)
CO2: 26 mmol/L (ref 22–32)
Calcium: 9.1 mg/dL (ref 8.9–10.3)
Chloride: 103 mmol/L (ref 98–111)
Creatinine, Ser: 0.68 mg/dL (ref 0.44–1.00)
GFR, Estimated: >60 mL/min (ref >60)
Glucose, Bld: 92 mg/dL (ref 70–99)
Potassium: 3.5 mmol/L (ref 3.5–5.1)
Sodium: 139 mmol/L (ref 135–145)
Total Bilirubin: 0.2 mg/dL — ABNORMAL LOW (ref 0.3–1.2)
Total Protein: 7 g/dL (ref 6.5–8.1)

## 2023-03-21 LAB — CBC
HCT: 44.3 % (ref 36.0–46.0)
Hemoglobin: 14 g/dL (ref 12.0–15.0)
MCH: 27.8 pg (ref 26.0–34.0)
MCHC: 31.6 g/dL (ref 30.0–36.0)
MCV: 88.1 fL (ref 80.0–100.0)
Platelets: 295 10*3/uL (ref 150–400)
RBC: 5.03 MIL/uL (ref 3.87–5.11)
RDW: 14 % (ref 11.5–15.5)
WBC: 15.7 10*3/uL — ABNORMAL HIGH (ref 4.0–10.5)
nRBC: 0 % (ref 0.0–0.2)

## 2023-03-21 MED ORDER — PREDNISONE 10 MG PO TABS: 40.0000 mg | ORAL_TABLET | Freq: Every day | ORAL | 0 refills | Status: DC

## 2023-03-21 MED ORDER — AZITHROMYCIN 250 MG PO TABS: 250.0000 mg | ORAL_TABLET | Freq: Every day | ORAL | 0 refills | Status: DC

## 2023-03-21 NOTE — Telephone Encounter (Signed)
Noted  

## 2023-03-21 NOTE — Discharge Instructions (Signed)
Return for any problem.  ?

## 2023-03-21 NOTE — ED Provider Triage Note (Signed)
Emergency Medicine Provider Triage Evaluation Note  CLEA DUBACH , a 62 y.o. female  was evaluated in triage.  Pt complains of SOB, productive cough, and HA x2 days. She called her PCP and told her to come here and that she would most likely need antibiotics.  Review of Systems  Positive: SOB, HA, productive cough Negative: N/V, abdominal pain, fever  Physical Exam  BP 130/78 (BP Location: Right Arm)   Pulse 80   Temp 98.5 F (36.9 C)   Resp 17   Ht 5\' 4"  (1.626 m)   Wt 67 kg   SpO2 92%   BMI 25.35 kg/m  Gen:   Awake, no distress   Resp:  Normal effort  MSK:   Moves extremities without difficulty  Other:    Medical Decision Making  Medically screening exam initiated at 12:41 PM.  Appropriate orders placed.  ZAMIRAH DENNY was informed that the remainder of the evaluation will be completed by another provider, this initial triage assessment does not replace that evaluation, and the importance of remaining in the ED until their evaluation is complete.   Maxwell Marion, PA-C 03/21/23 1243

## 2023-03-21 NOTE — ED Provider Notes (Signed)
[place] EMERGENCY DEPARTMENT AT [facility] Provider Note   CSN: [id number] Arrival date & time: 03/21/23  1122     History  No chief complaint on file.   Maria Oliver is a 62 y.o. female.  62 year old female with prior medical history as detailed below presents for evaluation.  Patient reports probably 2 days of congestion, cough, malaise.  Patient denies fever.  She denies chest pain or shortness of breath.  She does have history of COPD.  She contacted her PCP who advised her to come to the ED for evaluation.  The history is provided by the patient and medical records.       Home Medications Prior to Admission medications   Medication Sig Start Date End Date Taking? Authorizing Provider  acetaminophen (TYLENOL) 500 MG tablet Take 500-1,000 mg by mouth daily as needed for headache, mild pain or fever.    [provider]  albuterol (VENTOLIN HFA) 108 (90 Base) MCG/ACT inhaler Inhale 2 puffs into the lungs every 6 (six) hours as needed for wheezing or shortness of breath. 12/23/22   Hoy Register, MD  amLODipine (NORVASC) 5 MG tablet Take 1 tablet (5 mg total) by mouth daily. 12/23/22   Hoy Register, MD  cimetidine (TAGAMET) 200 MG tablet Take 200 mg by mouth 2 (two) times daily as needed (stomach pain).    [provider]  Fluticasone-Umeclidin-Vilant (TRELEGY ELLIPTA) 100-62.5-25 MCG/ACT AEPB Inhale 1 puff into the lungs daily. 01/31/23   Cobb, Ruby Cola, NP  lisinopril (ZESTRIL) 20 MG tablet TAKE 1 TABLET(20 MG) BY MOUTH DAILY 10/11/22   Hoy Register, MD  Misc. Devices MISC Pull ups - Medium.  Wipes Diagnosis- incontinence 02/16/23   Hoy Register, MD  Multiple Vitamin (MULTIVITAMIN WITH MINERALS) TABS tablet Take 1 tablet by mouth daily.    [provider]  pantoprazole (PROTONIX) 40 MG tablet Take 1 tablet (40 mg total) by mouth daily. 12/23/22   Hoy Register, MD  polyethylene glycol powder (MIRALAX) 17 GM/SCOOP powder Take  17 g by mouth daily. 12/08/22   Small, Brooke L, PA  rosuvastatin (CRESTOR) 5 MG tablet TAKE 1 TABLET(5 MG) BY MOUTH DAILY 12/24/22   Hoy Register, MD      Allergies    Aspirin, Ciprofloxacin, Codeine, Egg-derived products, Fentanyl, Hydrocodone, Lactose intolerance (gi), Oxycodone, Penicillin g, Penicillins, and Tape    Review of Systems   Review of Systems  All other systems reviewed and are negative.   Physical Exam Updated Vital Signs BP 116/74   Pulse 71   Temp 98.4 F (36.9 C) (Oral)   Resp 18   Ht 5\' 4"  (1.626 m)   Wt 67 kg   SpO2 94%   BMI 25.35 kg/m  Physical Exam Vitals and nursing note reviewed.  Constitutional:      General: She is not in acute distress.    Appearance: Normal appearance. She is well-developed.  HENT:     Head: Normocephalic and atraumatic.  Eyes:     Conjunctiva/sclera: Conjunctivae normal.     Pupils: Pupils are equal, round, and reactive to light.  Cardiovascular:     Rate and Rhythm: Normal rate and regular rhythm.     Heart sounds: Normal heart sounds.  Pulmonary:     Effort: Pulmonary effort is normal. No respiratory distress.     Breath sounds: Normal breath sounds.  Abdominal:     General: There is no distension.     Palpations: Abdomen is soft.  Tenderness: There is no abdominal tenderness.  Musculoskeletal:        General: No deformity. Normal range of motion.     Cervical back: Normal range of motion and neck supple.  Skin:    General: Skin is warm and dry.  Neurological:     General: No focal deficit present.     Mental Status: She is alert and oriented to person, place, and time.     ED Results / Procedures / Treatments   Labs (all labs ordered are listed, but only abnormal results are displayed) Labs Reviewed  COMPREHENSIVE METABOLIC PANEL - Abnormal; Notable for the following components:      Result Value   BUN <5 (*)    Total Bilirubin 0.2 (*)    All other components within normal limits  CBC - Abnormal;  Notable for the following components:   WBC 15.7 (*)    All other components within normal limits  RESP PANEL BY RT-PCR (RSV, FLU A&B, COVID)  [id number]    EKG EKG Interpretation Date/Time:  Monday March 21 2023 12:32:27 EDT Ventricular Rate:  79 PR Interval:  132 QRS Duration:  56 QT Interval:  410 QTC Calculation: 470 R Axis:   82  Text Interpretation: Normal sinus rhythm Low voltage QRS Septal infarct , age undetermined Abnormal ECG When compared with ECG of 07-Sep-2022 18:56, PREVIOUS ECG IS PRESENT Confirmed by Kristine Royal [phone]) on 03/21/2023 3:56:30 PM  Radiology DG Chest Port 1 View  Result Date: 03/21/2023 CLINICAL DATA:  Shortness of breath EXAM: PORTABLE CHEST 1 VIEW COMPARISON:  09/07/2022 FINDINGS: The heart size and mediastinal contours are within normal limits. Both lungs are clear. The visualized skeletal structures are unremarkable. IMPRESSION: No active disease. Electronically Signed   By: Elige Ko M.D.   On: 03/21/2023 15:09    Procedures Procedures    Medications Ordered in ED Medications - No data to display  ED Course/ Medical Decision Making/ A&P                                 Medical Decision Making Amount and/or Complexity of Data Reviewed Labs: ordered. Radiology: ordered.    Medical Screen Complete  This patient presented to the ED with complaint of cough, congestion, URI symptoms.  This complaint involves an extensive number of treatment options. The initial differential diagnosis includes, but is not limited to, versus bacterial infection, metabolic abnormality, COPD exacerbation, etc.  This presentation is: Acute, Chronic, Self-Limited, Previously Undiagnosed, Uncertain Prognosis, Complicated, and Systemic Symptoms  Patient presenting with complaint of cough, congestion, URI symptoms.  Patient with known history of COPD.  Patient is concerned that she may require a course of antibiotics for treatment of possible  infection.  Patient without significant signs of aspiratory compromise including hypoxia, respiratory distress.  Chest x-ray is without significant acute abnormality.  Flu testing is negative as well as COVID testing.  Other screening labs are without significant abnormality.  Patient with likely viral URI and likely early COPD exacerbation. She is requesting a Zpack and steroid burst.  Patient would likely benefit from this.  Importance of close follow-up stressed.  Strict return precautions given and understood.  Additional history obtained:  External records from outside sources obtained and reviewed including prior ED visits and prior Inpatient records.    Lab Tests:  I ordered and personally interpreted labs.  The pertinent results include: CBC, CMP, COVID, flu  Imaging Studies ordered:  I ordered imaging studies including chest x-ray I independently visualized and interpreted obtained imaging which showed NAD I agree with the radiologist interpretation.  Problem List / ED Course:  Cough, URI, COPD exacerbation   Reevaluation:  After the interventions noted above, I reevaluated the patient and found that they have: improved Disposition:  After consideration of the diagnostic results and the patients response to treatment, I feel that the patent would benefit from close outpatient followup.          Final Clinical Impression(s) / ED Diagnoses Final diagnoses:  Upper respiratory tract infection, unspecified type    Rx / DC Orders ED Discharge Orders          Ordered    predniSONE (DELTASONE) 10 MG tablet  Daily        03/21/23 1611    azithromycin (ZITHROMAX) 250 MG tablet  Daily        03/21/23 1611              Wynetta Fines, MD 03/21/23 1615

## 2023-03-21 NOTE — ED Triage Notes (Signed)
Pt c/o SOB, HA, productive cough w/yellow mucousx2d.

## 2023-03-21 NOTE — Telephone Encounter (Signed)
  Chief Complaint: SOB Symptoms: cough, headache, nasal and chest yellow phlegm Frequency: yesterday  Disposition: [x] ED /[] Urgent Care (no appt availability in office) / [] Appointment(In office/virtual)/ []  [facility]/ [] Home Care/ [] Refused Recommended Disposition /[] [facility]/ []  Follow-up with PCP Additional Notes: pt just called to touch base wanting to go to ED Reason for Disposition  [1] MODERATE difficulty breathing (e.g., speaks in phrases, SOB even at rest, pulse 100-120) AND [2] NEW-onset or WORSE than normal  Answer Assessment - Initial Assessment Questions 1. RESPIRATORY STATUS: "Describe your breathing?" (e.g., wheezing, shortness of breath, unable to speak, severe coughing)      SOB 2. ONSET: "When did this breathing problem begin?"      Yesterday  3. PATTERN "Does the difficult breathing come and go, or has it been constant since it started?"      constant 4. SEVERITY: "How bad is your breathing?" (e.g., mild, moderate, severe)    - MILD: No SOB at rest, mild SOB with walking, speaks normally in sentences, can lie down, no retractions, pulse < 100.    - MODERATE: SOB at rest, SOB with minimal exertion and prefers to sit, cannot lie down flat, speaks in phrases, mild retractions, audible wheezing, pulse 100-120.    - SEVERE: Very SOB at rest, speaks in single words, struggling to breathe, sitting hunched forward, retractions, pulse > 120      moderate 5. RECURRENT SYMPTOM: "Have you had difficulty breathing before?" If Yes, ask: "When was the last time?" and "What happened that time?"      H/o COPD ? URI 7. LUNG HISTORY: "Do you have any history of lung disease?"  (e.g., pulmonary embolus, asthma, emphysema)     COPD 8. CAUSE: "What do you think is causing the breathing problem?"      Infection wanting fluids and abx 9. OTHER SYMPTOMS: "Do you have any other symptoms? (e.g., dizziness, runny nose, cough, chest pain, fever)     Cough, headache,  congestion, yellow secretions from cough and nose  Protocols used: Breathing Difficulty-A-AH

## 2023-03-30 ENCOUNTER — Other Ambulatory Visit: Payer: Self-pay | Admitting: Family Medicine

## 2023-04-01 ENCOUNTER — Other Ambulatory Visit: Payer: Self-pay

## 2023-04-01 DIAGNOSIS — Z87891 Personal history of nicotine dependence: Secondary | ICD-10-CM

## 2023-04-01 DIAGNOSIS — F17210 Nicotine dependence, cigarettes, uncomplicated: Secondary | ICD-10-CM

## 2023-04-01 DIAGNOSIS — Z122 Encounter for screening for malignant neoplasm of respiratory organs: Secondary | ICD-10-CM

## 2023-04-12 ENCOUNTER — Ambulatory Visit: Payer: 59 | Attending: Family Medicine | Admitting: Family Medicine

## 2023-04-12 ENCOUNTER — Encounter: Payer: Self-pay | Admitting: Family Medicine

## 2023-04-12 VITALS — BP 134/83 | HR 80 | Ht 64.0 in | Wt 148.2 lb

## 2023-04-12 DIAGNOSIS — F419 Anxiety disorder, unspecified: Secondary | ICD-10-CM | POA: Diagnosis not present

## 2023-04-12 DIAGNOSIS — E7849 Other hyperlipidemia: Secondary | ICD-10-CM

## 2023-04-12 DIAGNOSIS — J432 Centrilobular emphysema: Secondary | ICD-10-CM

## 2023-04-12 DIAGNOSIS — I10 Essential (primary) hypertension: Secondary | ICD-10-CM

## 2023-04-12 DIAGNOSIS — K2970 Gastritis, unspecified, without bleeding: Secondary | ICD-10-CM

## 2023-04-12 MED ORDER — ROSUVASTATIN CALCIUM 5 MG PO TABS: 5.0000 mg | ORAL_TABLET | Freq: Every day | ORAL | 1 refills | Status: DC

## 2023-04-12 MED ORDER — LISINOPRIL 20 MG PO TABS
ORAL_TABLET | ORAL | 1 refills | Status: DC
Start: 2023-04-12 — End: 2023-10-24

## 2023-04-12 MED ORDER — PANTOPRAZOLE SODIUM 40 MG PO TBEC: 40.0000 mg | DELAYED_RELEASE_TABLET | Freq: Every day | ORAL | 1 refills | Status: DC

## 2023-04-12 MED ORDER — HYDROXYZINE HCL 25 MG PO TABS: 25.0000 mg | ORAL_TABLET | Freq: Three times a day (TID) | ORAL | 1 refills | Status: DC | PRN

## 2023-04-12 MED ORDER — AMLODIPINE BESYLATE 5 MG PO TABS: 5.0000 mg | ORAL_TABLET | Freq: Every day | ORAL | 1 refills | Status: DC

## 2023-04-12 NOTE — Patient Instructions (Signed)
VISIT SUMMARY:  During today's visit, we discussed your increased anxiety and nervousness due to ongoing property disputes with your neighbors. We also reviewed your current medications and health conditions, including COPD, hypertension, hyperlipidemia, and GERD. We have made some adjustments to help manage your symptoms and ensure your overall well-being.  YOUR PLAN:  -ANXIETY: Anxiety is a feeling of worry, nervousness, or unease. Your anxiety has increased due to personal issues. We have prescribed Hydroxyzine for you to use as needed to help manage your anxiety symptoms.  -COPD: Chronic Obstructive Pulmonary Disease (COPD) is a chronic inflammatory lung disease that causes obstructed airflow from the lungs. Your COPD is stable with your current inhalers (Breo and Albuterol). We encourage you to quit smoking to improve your lung health.  -HYPERTENSION: Hypertension is high blood pressure. Your blood pressure is well-controlled with your current medications, Amlodipine and Lisinopril. Continue taking these medications as prescribed.  -HYPERLIPIDEMIA: Hyperlipidemia is having high levels of fats (lipids) in your blood, such as cholesterol. Your last cholesterol level was elevated. Continue taking Crestor as prescribed, and we will plan for a fasting lipid panel at your next visit to monitor your cholesterol levels.  -GERD: Gastroesophageal Reflux Disease (GERD) is a digestive disorder that affects the lower esophageal sphincter. You reported stomach aches, so we will refill your Pantoprazole prescription to help manage your symptoms.  -GENERAL HEALTH MAINTENANCE: We will refill all your current medications for a 90-day supply. We will also plan for a fasting lipid panel at your next visit. Please follow up after the resolution of your current personal issues.  INSTRUCTIONS:  Please follow up after the resolution of your current personal issues. We will also plan for a fasting lipid panel at  your next visit.

## 2023-04-12 NOTE — Progress Notes (Signed)
Subjective:  Patient ID: Maria Oliver, female    DOB: Nov 09, 1960  Age: 62 y.o. MRN: [id number]  CC: Medical Management of Chronic Issues (Refill all medications/Discuss medication for nerves/)   HPI Maria Oliver is a 62 y.o. year old female with a history of COPD, hypertension, tobacco abuse (smoking since elementary school).   Interval History: Discussed the use of AI scribe software for clinical note transcription with the patient, who gave verbal consent to proceed.  She presents with increased anxiety and nervousness. She reports ongoing property disputes with her neighbors, causing significant distress and uncertainty about her living situation. The patient has lived in her current trailer for twenty-three years and is now facing the possibility of having to move due to the disputes. This situation has led to increased anxiety, nervousness, and some depression. The patient reports feeling like her "insides are jumping" and she is unable to sit still or stop thinking about the situation. She also reports increased smoking due to the anxiety and nervousness. The patient is seeking medication to help manage these symptoms.  The patient also reports needing refills for her current medications, including amlodipine and lisinopril for hypertension, pantoprazole for acid reflux, and Crestor for hyperlipidemia. She reports that her COPD is currently under control with the use of Breo and albuterol inhalers, as prescribed by her pulmonologist.        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 09/09/2016   Generalized headaches    Hernia, inguinal, right    Hypertension     Leg swelling     Past Surgical History:  Procedure Laterality Date   COLON SURGERY  2010   per patient "colon take down"   COLOSTOMY  2010   LAPAROSCOPIC CHOLECYSTECTOMY SINGLE SITE WITH INTRAOPERATIVE CHOLANGIOGRAM N/A 09/09/2016   Procedure: LAPAROSCOPIC CHOLECYSTECTOMY  WITH INTRAOPERATIVE CHOLANGIOGRAM;  Surgeon: Karie Soda, MD;  Location: [facility];  Service: General;  Laterality: N/A;   TUBAL LIGATION      Family History  Problem Relation Age of Onset   Heart disease Mother 41   Heart failure Mother    Asthma Mother    Emphysema Mother    Alzheimer's disease Father 73   Emphysema Brother    Asthma Brother    Colon cancer Neg Hx    Rectal cancer Neg Hx    Esophageal cancer Neg Hx    Pancreatic cancer Neg Hx     Social History   Socioeconomic History   Marital status: Widowed    Spouse name: Not on file   Number of children: 3   Years of education: Not on file   Highest education level: Not on file  Occupational History   Occupation: disabled  Tobacco Use   Smoking status: Every Day    Current packs/day: 1.00    Average packs/day: 1 pack/day for 47.0 years (47.0 ttl pk-yrs)    Types: Cigarettes   Smokeless tobacco: Never   Tobacco comments:    1/2 ppd 02/25/20   Vaping Use   Vaping status: Never Used  Substance and Sexual Activity   Alcohol use: No    Alcohol/week: 0.0 standard  drinks of alcohol   Drug use: No   Sexual activity: Not Currently    Birth control/protection: Surgical, None  Other Topics Concern   Not on file  Social History Narrative   Not on file   Social Determinants of Health   Financial Resource Strain: Low Risk  (01/12/2023)   Overall Financial Resource Strain (CARDIA)    Difficulty of Paying Living Expenses: Not hard at all  Food Insecurity: No Food Insecurity (01/12/2023)   Hunger Vital Sign    Worried About Running Out of Food in the Last Year: Never true    Ran Out of Food in the Last Year: Never true  Transportation Needs: No  Transportation Needs (01/12/2023)   PRAPARE - Administrator, Civil Service (Medical): No    Lack of Transportation (Non-Medical): No  Physical Activity: Inactive (01/12/2023)   Exercise Vital Sign    Days of Exercise per Week: 0 days    Minutes of Exercise per Session: 0 min  Stress: No Stress Concern Present (01/12/2023)   [employer] of Occupational Health - Occupational Stress Questionnaire    Feeling of Stress : Not at all  Social Connections: Socially Isolated (01/12/2023)   Social Connection and Isolation Panel [NHANES]    Frequency of Communication with Friends and Family: More than three times a week    Frequency of Social Gatherings with Friends and Family: More than three times a week    Attends Religious Services: Never    Database administrator or Organizations: No    Attends Banker Meetings: Never    Marital Status: Widowed    Allergies  Allergen Reactions   Aspirin Nausea Only and Other (See Comments)    GI upset/Hernia     Ciprofloxacin Nausea Only and Other (See Comments)    Tendonitis, joint pain, and nausea.  "Interfered with her HCTZ" (also)   Codeine Nausea And Vomiting and Other (See Comments)    (Also) GI upset/Hernia (CAN TOLERATE DILAUDID & MORPHINE)   Egg-Derived Products Nausea Only    Stomach pains   Fentanyl Nausea Only and Other (See Comments)    Tolerates Dilaudid or morphine much better   Hydrocodone Nausea And Vomiting   Lactose Intolerance (Gi) Nausea And Vomiting and Other (See Comments)    (Also) vertigo   Oxycodone Nausea And Vomiting    Tolerates hydromorphone better   Penicillin G Rash   Penicillins Rash    Has patient had a PCN reaction causing immediate rash, facial/tongue/throat swelling, SOB or lightheadedness with hypotension: Yes Has patient had a PCN reaction causing severe rash involving mucus membranes or skin necrosis: No Has patient had a PCN reaction that required hospitalization No Has patient  had a PCN reaction occurring within the last 10 years: No If all of the above answers are "NO", then may proceed with Cephalosporin use.    Tape Rash and Other (See Comments)    Please try paper tape    Outpatient Medications Prior to Visit  Medication Sig Dispense Refill   acetaminophen (TYLENOL) 500 MG tablet Take 500-1,000 mg by mouth daily as needed for headache, mild pain or fever.     albuterol (VENTOLIN HFA) 108 (90 Base) MCG/ACT inhaler Inhale 2 puffs into the lungs every 6 (six) hours as needed for wheezing or shortness of breath. 25.5 g 1   cimetidine (TAGAMET) 200 MG tablet Take 200 mg by mouth 2 (two) times daily as needed (stomach pain).  Fluticasone-Umeclidin-Vilant (TRELEGY ELLIPTA) 100-62.5-25 MCG/ACT AEPB Inhale 1 puff into the lungs daily. 60 each 6   Misc. Devices MISC Pull ups - Medium.  Wipes Diagnosis- incontinence 1 Box 0   Multiple Vitamin (MULTIVITAMIN WITH MINERALS) TABS tablet Take 1 tablet by mouth daily.     polyethylene glycol powder (MIRALAX) 17 GM/SCOOP powder Take 17 g by mouth daily. 510 g 0   amLODipine (NORVASC) 5 MG tablet Take 1 tablet (5 mg total) by mouth daily. 30 tablet 0   lisinopril (ZESTRIL) 20 MG tablet TAKE 1 TABLET(20 MG) BY MOUTH DAILY 90 tablet 1   pantoprazole (PROTONIX) 40 MG tablet Take 1 tablet (40 mg total) by mouth daily. 30 tablet 0   rosuvastatin (CRESTOR) 5 MG tablet TAKE 1 TABLET(5 MG) BY MOUTH DAILY 90 tablet 0   azithromycin (ZITHROMAX) 250 MG tablet Take 1 tablet (250 mg total) by mouth daily. Take first 2 tablets together, then 1 every day until finished. (Patient not taking: Reported on 04/12/2023) 6 tablet 0   predniSONE (DELTASONE) 10 MG tablet Take 4 tablets (40 mg total) by mouth daily. (Patient not taking: Reported on 04/12/2023) 20 tablet 0   No facility-administered medications prior to visit.     ROS Review of Systems  Constitutional:  Negative for activity change and appetite change.  HENT:  Negative for  sinus pressure and sore throat.   Respiratory:  Negative for chest tightness, shortness of breath and wheezing.   Cardiovascular:  Negative for chest pain and palpitations.  Gastrointestinal:  Negative for abdominal distention, abdominal pain and constipation.  Genitourinary: Negative.   Musculoskeletal: Negative.   Psychiatric/Behavioral:  Positive for dysphoric mood. Negative for behavioral problems.        Anxious    Objective:  BP 134/83   Pulse 80   Ht 5\' 4"  (1.626 m)   Wt 148 lb 3.2 oz (67.2 kg)   SpO2 95%   BMI 25.44 kg/m      04/12/2023    1:42 PM 03/21/2023    4:08 PM 03/21/2023   12:20 PM  BP/Weight  Systolic BP 134 116   Diastolic BP 83 74   Wt. (Lbs) 148.2  147.71  BMI 25.44 kg/m2  25.35 kg/m2      Physical Exam Constitutional:      Appearance: She is well-developed.  Eyes:     General: No scleral icterus. Cardiovascular:     Rate and Rhythm: Normal rate.     Heart sounds: Normal heart sounds. No murmur heard. Pulmonary:     Effort: Pulmonary effort is normal.     Breath sounds: Normal breath sounds. No wheezing or rales.  Chest:     Chest wall: No tenderness.  Abdominal:     General: Bowel sounds are normal. There is no distension.     Palpations: Abdomen is soft. There is no mass.     Tenderness: There is no abdominal tenderness.     Hernia: A hernia is present.  Musculoskeletal:        General: Normal range of motion.     Right lower leg: No edema.     Left lower leg: No edema.  Neurological:     Mental Status: She is alert and oriented to person, place, and time.  Psychiatric:        Mood and Affect: Mood is anxious.        Latest Ref Rng & Units 03/21/2023   12:23 PM 01/25/2023    3:19 PM 12/07/2022  11:55 PM  CMP  Glucose 70 - 99 mg/dL 92  161  80   BUN 8 - 23 mg/dL 5  10  6    Creatinine 0.44 - 1.00 mg/dL 0.96  0.45  4.09   Sodium 135 - 145 mmol/L 139  141  139   Potassium 3.5 - 5.1 mmol/L 3.5  4.2  3.7   Chloride 98 - 111 mmol/L  103  102  103   CO2 22 - 32 mmol/L 26  32  27   Calcium 8.9 - 10.3 mg/dL 9.1  9.4  9.1   Total Protein 6.5 - 8.1 g/dL 7.0  6.9  6.5   Total Bilirubin 0.3 - 1.2 mg/dL 0.2  0.3  0.2   Alkaline Phos 38 - 126 U/L 77  72  67   AST 15 - 41 U/L 17  15  18    ALT 0 - 44 U/L 18  14  19      Lipid Panel     Component Value Date/Time   CHOL 223 (H) 10/11/2022 1011   TRIG 142 10/11/2022 1011   HDL 50 10/11/2022 1011   CHOLHDL 3.5 06/28/2017 1157   CHOLHDL 4.7 05/09/2013 1445   VLDL 32 05/09/2013 1445   LDLCALC 147 (H) 10/11/2022 1011    CBC    Component Value Date/Time   WBC 15.7 (H) 03/21/2023 1223   RBC 5.03 03/21/2023 1223   HGB 14.0 03/21/2023 1223   HGB 15.0 05/12/2022 1549   HCT 44.3 03/21/2023 1223   HCT 47.7 (H) 05/12/2022 1549   PLT 295 03/21/2023 1223   PLT 393 05/12/2022 1549   MCV 88.1 03/21/2023 1223   MCV 88 05/12/2022 1549   MCH 27.8 03/21/2023 1223   MCHC 31.6 03/21/2023 1223   RDW 14.0 03/21/2023 1223   RDW 13.2 05/12/2022 1549   LYMPHSABS 3.0 07/27/2022 0411   LYMPHSABS 3.7 (H) 05/12/2022 1549   MONOABS 0.7 07/27/2022 0411   EOSABS 0.7 (H) 07/27/2022 0411   EOSABS 0.6 (H) 05/12/2022 1549   BASOSABS 0.1 07/27/2022 0411   BASOSABS 0.1 05/12/2022 1549    Lab Results  Component Value Date   HGBA1C 5.6 12/10/2020    The 10-year ASCVD risk score (Arnett DK, et al., 2019) is: 15%   Values used to calculate the score:     Age: 24 years     Sex: Female     Is Non-Hispanic African American: No     Diabetic: No     Tobacco smoker: Yes     Systolic Blood Pressure: 134 mmHg     Is BP treated: Yes     HDL Cholesterol: 50 mg/dL     Total Cholesterol: 223 mg/dL  Assessment & Plan:      Anxiety Increased anxiety due to ongoing personal issues. Patient reports feeling restless and unable to sit still. -Prescribe Hydroxyzine for use as needed to manage anxiety symptoms.  COPD Stable, but patient continues to smoke heavily. -Continue current inhalers (Breo  and Albuterol). -Encourage smoking cessation.  Hypertension Controlled on current medication. -Continue Amlodipine and Lisinopril. -Counseled on blood pressure goal of less than 130/80, low-sodium, DASH diet, medication compliance, 150 minutes of moderate intensity exercise per week. Discussed medication compliance, adverse effects.   Hyperlipidemia Last cholesterol level was elevated (223 in April). - 10 Year ASCVD risk score of 15% -Continue Crestor. -Plan for fasting lipid panel at next visit.  GERD/ Gastritis Patient reports stomach aches. -Refill Pantoprazole.  General Health Maintenance /  Followup Plans -Refill all current medications for 90-day supply. -Plan for fasting lipid panel at next visit. -Follow-up after resolution of current personal issues.          Meds ordered this encounter  Medications   amLODipine (NORVASC) 5 MG tablet    Sig: Take 1 tablet (5 mg total) by mouth daily.    Dispense:  90 tablet    Refill:  1   lisinopril (ZESTRIL) 20 MG tablet    Sig: TAKE 1 TABLET(20 MG) BY MOUTH DAILY    Dispense:  90 tablet    Refill:  1   rosuvastatin (CRESTOR) 5 MG tablet    Sig: Take 1 tablet (5 mg total) by mouth daily.    Dispense:  90 tablet    Refill:  1   hydrOXYzine (ATARAX) 25 MG tablet    Sig: Take 1 tablet (25 mg total) by mouth 3 (three) times daily as needed.    Dispense:  90 tablet    Refill:  1   pantoprazole (PROTONIX) 40 MG tablet    Sig: Take 1 tablet (40 mg total) by mouth daily.    Dispense:  90 tablet    Refill:  1    Follow-up: Return in about 6 months (around 10/11/2023) for Chronic medical conditions.       Hoy Register, MD, FAAFP. [facility] [place], [place] [phone]   04/12/2023, 2:23 PM

## 2023-06-16 ENCOUNTER — Ambulatory Visit: Payer: Self-pay

## 2023-06-16 ENCOUNTER — Telehealth: Payer: 59 | Admitting: Nurse Practitioner

## 2023-06-16 DIAGNOSIS — J209 Acute bronchitis, unspecified: Secondary | ICD-10-CM | POA: Diagnosis not present

## 2023-06-16 DIAGNOSIS — J440 Chronic obstructive pulmonary disease with acute lower respiratory infection: Secondary | ICD-10-CM | POA: Diagnosis not present

## 2023-06-16 MED ORDER — PREDNISONE 20 MG PO TABS: 20.0000 mg | ORAL_TABLET | Freq: Two times a day (BID) | ORAL | 0 refills | Status: AC

## 2023-06-16 MED ORDER — BENZONATATE 100 MG PO CAPS
100.0000 mg | ORAL_CAPSULE | Freq: Three times a day (TID) | ORAL | 0 refills | Status: DC | PRN
Start: 2023-06-16 — End: 2023-10-24

## 2023-06-16 MED ORDER — AZITHROMYCIN 250 MG PO TABS
ORAL_TABLET | ORAL | 0 refills | Status: AC
Start: 2023-06-16 — End: 2023-06-21

## 2023-06-16 NOTE — Progress Notes (Signed)
 Virtual Visit Consent   Maria Oliver, you are scheduled for a virtual visit with a [facility] provider today. Just as with appointments in the office, your consent must be obtained to participate. Your consent will be active for this visit and any virtual visit you may have with one of our providers in the next 365 days. If you have a MyChart account, a copy of this consent can be sent to you electronically.  As this is a virtual visit, video technology does not allow for your provider to perform a traditional examination. This may limit your provider's ability to fully assess your condition. If your provider identifies any concerns that need to be evaluated in person or the need to arrange testing (such as labs, EKG, etc.), we will make arrangements to do so. Although advances in technology are sophisticated, we cannot ensure that it will always work on either your end or our end. If the connection with a video visit is poor, the visit may have to be switched to a telephone visit. With either a video or telephone visit, we are not always able to ensure that we have a secure connection.  By engaging in this virtual visit, you consent to the provision of healthcare and authorize for your insurance to be billed (if applicable) for the services provided during this visit. Depending on your insurance coverage, you may receive a charge related to this service.  I need to obtain your verbal consent now. Are you willing to proceed with your visit today? SHALI VESEY has provided verbal consent on 06/16/2023 for a virtual visit (video or telephone). Lauraine Kitty, FNP  Date: 06/16/2023 1:58 PM  Virtual Visit via Video Note   I, Lauraine Kitty, connected with  Maria Oliver  ([id number], 19-Jul-1960) on 06/16/23 at  2:00 PM EST by a video-enabled telemedicine application and verified that I am speaking with the correct person using two identifiers.  Location: Patient: Virtual Visit Location Patient:  Home Provider: Virtual Visit Location Provider: Home Office   I discussed the limitations of evaluation and management by telemedicine and the availability of in person appointments. The patient expressed understanding and agreed to proceed.    History of Present Illness: Maria Oliver is a 63 y.o. who identifies as a female who was assigned female at birth, and is being seen today for cough associated with underlying COPD.   She has been feeling an increase in COPD symptoms for the past 48 hours  She has a darker mucous that is associated with a productive cough   Her grandchildren have been over with a cough she is unsure what from   Patient is now coughing to the point of pain, she is able to produce the mucous  She has been taking tylenol  for relief of HA as well   She has been using her Albuterol  every 4 hours  Most recent prednisone  was in October     Problems:  Patient Active Problem List   Diagnosis Date Noted   History of adenomatous polyp of colon 2010 01/04/2023   Hypotension 07/24/2022   Enteritis 07/24/2022   Syncope, vasovagal 07/24/2022   Upper respiratory infection, acute 05/11/2021   Dizziness 05/11/2021   Syncope 02/22/2021   COVID-19 virus infection 02/22/2021   AKI (acute kidney injury) (HCC) 02/22/2021   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/07/2017   Pruritic rash 10/14/2016   Chronic narcotic use 09/09/2016   Onychomycosis of right great toe 10/13/2015  Osteoarthritis 08/11/2015   Chronic abdominal pain 05/22/2015   Chronic pain syndrome 05/22/2015   Vitamin D  insufficiency 02/06/2015   Chronic knee pain 02/06/2015   Ventral hernia without obstruction or gangrene 12/31/2014   Anxiety 12/31/2014   COPD with acute exacerbation (HCC)    COPD (chronic obstructive pulmonary disease) with emphysema (HCC)    Tobacco abuse 10/01/2009   Essential hypertension 10/01/2009    Allergies:  Allergies  Allergen Reactions   Aspirin Nausea  Only and Other (See Comments)    GI upset/Hernia     Ciprofloxacin  Nausea Only and Other (See Comments)    Tendonitis, joint pain, and nausea.  Interfered with her HCTZ (also)   Codeine Nausea And Vomiting and Other (See Comments)    (Also) GI upset/Hernia (CAN TOLERATE DILAUDID  & MORPHINE )   Egg-Derived Products Nausea Only    Stomach pains   Fentanyl  Nausea Only and Other (See Comments)    Tolerates Dilaudid  or morphine  much better   Hydrocodone  Nausea And Vomiting   Lactose Intolerance (Gi) Nausea And Vomiting and Other (See Comments)    (Also) vertigo   Oxycodone  Nausea And Vomiting    Tolerates hydromorphone  better   Penicillin G Rash   Penicillins Rash    Has patient had a PCN reaction causing immediate rash, facial/tongue/throat swelling, SOB or lightheadedness with hypotension: Yes Has patient had a PCN reaction causing severe rash involving mucus membranes or skin necrosis: No Has patient had a PCN reaction that required hospitalization No Has patient had a PCN reaction occurring within the last 10 years: No If all of the above answers are NO, then may proceed with Cephalosporin use.    Tape Rash and Other (See Comments)    Please try paper tape   Medications:  Current Outpatient Medications:    acetaminophen  (TYLENOL ) 500 MG tablet, Take 500-1,000 mg by mouth daily as needed for headache, mild pain or fever., Disp: , Rfl:    albuterol  (VENTOLIN  HFA) 108 (90 Base) MCG/ACT inhaler, Inhale 2 puffs into the lungs every 6 (six) hours as needed for wheezing or shortness of breath., Disp: 25.5 g, Rfl: 1   amLODipine  (NORVASC ) 5 MG tablet, Take 1 tablet (5 mg total) by mouth daily., Disp: 90 tablet, Rfl: 1   azithromycin  (ZITHROMAX ) 250 MG tablet, Take 1 tablet (250 mg total) by mouth daily. Take first 2 tablets together, then 1 every day until finished. (Patient not taking: Reported on 04/12/2023), Disp: 6 tablet, Rfl: 0   cimetidine (TAGAMET) 200 MG tablet, Take 200 mg by  mouth 2 (two) times daily as needed (stomach pain)., Disp: , Rfl:    Fluticasone -Umeclidin-Vilant (TRELEGY ELLIPTA ) 100-62.5-25 MCG/ACT AEPB, Inhale 1 puff into the lungs daily., Disp: 60 each, Rfl: 6   hydrOXYzine  (ATARAX ) 25 MG tablet, Take 1 tablet (25 mg total) by mouth 3 (three) times daily as needed., Disp: 90 tablet, Rfl: 1   lisinopril  (ZESTRIL ) 20 MG tablet, TAKE 1 TABLET(20 MG) BY MOUTH DAILY, Disp: 90 tablet, Rfl: 1   Misc. Devices MISC, Pull ups - Medium.  Wipes Diagnosis- incontinence, Disp: 1 Box, Rfl: 0   Multiple Vitamin (MULTIVITAMIN WITH MINERALS) TABS tablet, Take 1 tablet by mouth daily., Disp: , Rfl:    pantoprazole  (PROTONIX ) 40 MG tablet, Take 1 tablet (40 mg total) by mouth daily., Disp: 90 tablet, Rfl: 1   polyethylene glycol powder (MIRALAX ) 17 GM/SCOOP powder, Take 17 g by mouth daily., Disp: 510 g, Rfl: 0   predniSONE  (DELTASONE ) 10 MG tablet,  Take 4 tablets (40 mg total) by mouth daily. (Patient not taking: Reported on 04/12/2023), Disp: 20 tablet, Rfl: 0   rosuvastatin  (CRESTOR ) 5 MG tablet, Take 1 tablet (5 mg total) by mouth daily., Disp: 90 tablet, Rfl: 1  Observations/Objective: Patient is well-developed, well-nourished in no acute distress.  Resting comfortably  at home.  Head is normocephalic, atraumatic.  No labored breathing.  Speech is clear and coherent with logical content.  Patient is alert and oriented at baseline.    Assessment and Plan:  1. Acute bronchitis with COPD (HCC) (Primary)  Meds ordered this encounter  Medications   azithromycin  (ZITHROMAX ) 250 MG tablet    Sig: Take 2 tablets on day 1, then 1 tablet daily on days 2 through 5    Dispense:  6 tablet    Refill:  0   predniSONE  (DELTASONE ) 20 MG tablet    Sig: Take 1 tablet (20 mg total) by mouth 2 (two) times daily with a meal for 5 days.    Dispense:  10 tablet    Refill:  0   benzonatate  (TESSALON ) 100 MG capsule    Sig: Take 1 capsule (100 mg total) by mouth 3 (three) times  daily as needed.    Dispense:  30 capsule    Refill:  0       Follow Up Instructions: I discussed the assessment and treatment plan with the patient. The patient was provided an opportunity to ask questions and all were answered. The patient agreed with the plan and demonstrated an understanding of the instructions.  A copy of instructions were sent to the patient via MyChart unless otherwise noted below.    The patient was advised to call back or seek an in-person evaluation if the symptoms worsen or if the condition fails to improve as anticipated.    Lauraine Kitty, FNP

## 2023-06-16 NOTE — Telephone Encounter (Signed)
 Noted.

## 2023-06-16 NOTE — Telephone Encounter (Signed)
  Chief Complaint: SOB Symptoms: SOB, cough with congestion, HA, chest tightness Frequency: yesterday  Pertinent Negatives:NA Disposition: [] ED /[] Urgent Care (no appt availability in office) / [] Appointment(In office/virtual)/ [x]  [facility]/ [] Home Care/ [] Refused Recommended Disposition /[] [facility]/ []  Follow-up with PCP Additional Notes: pt states she has got cold that grandkids have and with her having COPD making her SOB. She is asking for abx and steroid. No appts with practice. Scheduled VUC today at 1400. Pt is still using prescribed inhalers.    Reason for Disposition  [1] MILD difficulty breathing (e.g., minimal/no SOB at rest, SOB with walking, pulse <100) AND [2] NEW-onset or WORSE than normal  Answer Assessment - Initial Assessment Questions 1. RESPIRATORY STATUS: Describe your breathing? (e.g., wheezing, shortness of breath, unable to speak, severe coughing)      SOB 2. ONSET: When did this breathing problem begin?      Yesterday  3. PATTERN Does the difficult breathing come and go, or has it been constant since it started?      Comes and goes  4. SEVERITY: How bad is your breathing? (e.g., mild, moderate, severe)    - MILD: No SOB at rest, mild SOB with walking, speaks normally in sentences, can lie down, no retractions, pulse < 100.    - MODERATE: SOB at rest, SOB with minimal exertion and prefers to sit, cannot lie down flat, speaks in phrases, mild retractions, audible wheezing, pulse 100-120.    - SEVERE: Very SOB at rest, speaks in single words, struggling to breathe, sitting hunched forward, retractions, pulse > 120      Mild to moderate  7. LUNG HISTORY: Do you have any history of lung disease?  (e.g., pulmonary embolus, asthma, emphysema)     COPD 9. OTHER SYMPTOMS: Do you have any other symptoms? (e.g., dizziness, runny nose, cough, chest pain, fever)     Cough with congestion, HA, chest tightness  Protocols used: Breathing  Difficulty-A-AH

## 2023-08-29 ENCOUNTER — Other Ambulatory Visit: Payer: Self-pay | Admitting: Family Medicine

## 2023-08-31 ENCOUNTER — Telehealth: Payer: Self-pay | Admitting: Family Medicine

## 2023-08-31 NOTE — Telephone Encounter (Signed)
 Copied from CRM [id number]. Topic: General - Other >> Aug 31, 2023  2:44 PM Emylou G wrote: Reason for CRM: patient called checking status of letter to be signed by doctor to be sent over to home health dentistry.. pls call her at [phone]

## 2023-08-31 NOTE — Telephone Encounter (Signed)
 Copied from CRM [id number]. Topic: General - Other >> Aug 31, 2023  2:44 PM Maria Oliver wrote: Reason for CRM: patient called checking status of letter to be signed by doctor to be sent over to home health dentistry.. pls call her at [phone]

## 2023-09-01 ENCOUNTER — Telehealth: Payer: Self-pay

## 2023-09-01 NOTE — Telephone Encounter (Signed)
 Patient was called and informed that dental form will be completed tomorrow and faxed,     Copied from CRM [id number]. Topic: General - Other >> Sep 01, 2023  1:24 PM Maria Oliver wrote: Reason for CRM: Patient would like to know when she can pick up form that was dropped off a 1 week ago//Please call

## 2023-09-02 NOTE — Telephone Encounter (Signed)
 Form has been completed.

## 2023-09-08 ENCOUNTER — Ambulatory Visit (HOSPITAL_COMMUNITY)
Admission: RE | Admit: 2023-09-08 | Discharge: 2023-09-08 | Disposition: A | Discharge status: Home / Self Care | Source: Ambulatory Visit | Attending: Family Medicine | Admitting: Family Medicine

## 2023-09-08 DIAGNOSIS — Z87891 Personal history of nicotine dependence: Secondary | ICD-10-CM | POA: Insufficient documentation

## 2023-09-08 DIAGNOSIS — F17210 Nicotine dependence, cigarettes, uncomplicated: Secondary | ICD-10-CM | POA: Diagnosis present

## 2023-09-08 DIAGNOSIS — Z122 Encounter for screening for malignant neoplasm of respiratory organs: Secondary | ICD-10-CM | POA: Insufficient documentation

## 2023-09-29 ENCOUNTER — Other Ambulatory Visit: Payer: Self-pay

## 2023-09-29 DIAGNOSIS — Z122 Encounter for screening for malignant neoplasm of respiratory organs: Secondary | ICD-10-CM

## 2023-09-29 DIAGNOSIS — F17210 Nicotine dependence, cigarettes, uncomplicated: Secondary | ICD-10-CM

## 2023-09-29 DIAGNOSIS — Z87891 Personal history of nicotine dependence: Secondary | ICD-10-CM

## 2023-10-11 ENCOUNTER — Ambulatory Visit: Payer: 59 | Admitting: Family Medicine

## 2023-10-12 ENCOUNTER — Other Ambulatory Visit: Payer: Self-pay | Admitting: Family Medicine

## 2023-10-24 ENCOUNTER — Encounter: Payer: Self-pay | Admitting: Family Medicine

## 2023-10-24 ENCOUNTER — Ambulatory Visit: Attending: Family Medicine | Admitting: Family Medicine

## 2023-10-24 VITALS — BP 146/87 | HR 78 | Ht 64.0 in | Wt 150.6 lb

## 2023-10-24 DIAGNOSIS — I10 Essential (primary) hypertension: Secondary | ICD-10-CM

## 2023-10-24 DIAGNOSIS — K5909 Other constipation: Secondary | ICD-10-CM

## 2023-10-24 DIAGNOSIS — E7849 Other hyperlipidemia: Secondary | ICD-10-CM | POA: Diagnosis not present

## 2023-10-24 DIAGNOSIS — M62838 Other muscle spasm: Secondary | ICD-10-CM | POA: Diagnosis not present

## 2023-10-24 DIAGNOSIS — Z131 Encounter for screening for diabetes mellitus: Secondary | ICD-10-CM

## 2023-10-24 DIAGNOSIS — J432 Centrilobular emphysema: Secondary | ICD-10-CM | POA: Diagnosis not present

## 2023-10-24 MED ORDER — LISINOPRIL 20 MG PO TABS
ORAL_TABLET | ORAL | 1 refills | Status: DC
Start: 2023-10-24 — End: 2024-01-16

## 2023-10-24 MED ORDER — TIZANIDINE HCL 4 MG PO TABS: 4.0000 mg | ORAL_TABLET | Freq: Three times a day (TID) | ORAL | 1 refills | Status: AC | PRN

## 2023-10-24 MED ORDER — AMLODIPINE BESYLATE 5 MG PO TABS: 5.0000 mg | ORAL_TABLET | Freq: Every day | ORAL | 1 refills | Status: DC

## 2023-10-24 MED ORDER — PANTOPRAZOLE SODIUM 40 MG PO TBEC: 40.0000 mg | DELAYED_RELEASE_TABLET | Freq: Every day | ORAL | 1 refills | Status: DC

## 2023-10-24 NOTE — Patient Instructions (Signed)
 VISIT SUMMARY:  Today, we discussed your ongoing health concerns, including COPD, hypertension, high cholesterol, acid reflux, back pain, and constipation. We reviewed your current medications and made some adjustments to your treatment plan to help manage your symptoms more effectively.  YOUR PLAN:  -COPD WITH EMPHYSEMA: COPD is a chronic lung disease that makes it hard to breathe. Your condition is stable with your current inhalers. We will continue with your current inhaler regimen and schedule a follow-up chest CT in 12 months to monitor your condition.  -HYPERTENSION: Hypertension is high blood pressure. Your blood pressure is slightly elevated, but we will continue with your current medication regimen. Please recheck your blood pressure before you leave today. We also recommend a low sodium diet and encourage you to quit smoking.  -HYPERLIPIDEMIA: Hyperlipidemia means you have high cholesterol levels. You are currently taking rosuvastatin  5 mg. We need to check your cholesterol levels with a fasting lipid panel tomorrow morning to see if we need to adjust your medication dose.  -GERD: GERD is acid reflux. Your current medication, pantoprazole , is effectively managing your symptoms. Please continue taking pantoprazole  as prescribed.  -BACK PAIN: You have chronic back pain that is relieved by rest. We will refer you to physical therapy for evaluation and treatment. Additionally, we will prescribe tizanidine  to help relax your muscles as needed.  -CONSTIPATION: Constipation is difficulty in passing stools. You are managing this with dietary changes and occasional laxatives. Please continue with your dietary modifications and use laxatives as needed.  INSTRUCTIONS:  Please recheck your blood pressure before you leave today. Also, remember to get your fasting lipid panel done tomorrow morning so we can evaluate your cholesterol levels and adjust your medication if necessary.

## 2023-10-24 NOTE — Progress Notes (Signed)
 Subjective:  Patient ID: Maria Oliver, female    DOB: 1961/03/15  Age: 63 y.o. MRN: [id number]  CC: Medical Management of Chronic Issues (No energy )     Discussed the use of AI scribe software for clinical note transcription with the patient, who gave verbal consent to proceed.  History of Present Illness Maria Oliver is a 63 year old female with a history of hypertension, nicotine  dependence (since elementary school) COPD who presents with fatigue and back discomfort.  She experiences fatigue and thoracic muscle discomfort, described as tiredness rather than pain, intermittently for six months with increased frequency. Resting, especially in her car, alleviates the discomfort. The discomfort is bilateral and worsens with standing or walking.  When this occurs she goes to her car to rest and she does notice relief.  She manages COPD with inhalers. A recent chest CT showed emphysema and aortic atherosclerosis.  She takes rosuvastatin  5 mg for elevated cholesterol levels.  Her blood pressure is slightly elevated and she endorses adherence with her antihypertensive.  She is also doing well on statin. She smokes and prefers to manage this independently without medication assistance.    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 09/09/2016   Generalized headaches    Hernia, inguinal, right    Hypertension    Leg swelling     Past Surgical History:  Procedure Laterality Date   COLON SURGERY  2010   per patient "colon take down"   COLOSTOMY  2010   LAPAROSCOPIC CHOLECYSTECTOMY SINGLE SITE WITH INTRAOPERATIVE CHOLANGIOGRAM N/A 09/09/2016   Procedure: LAPAROSCOPIC  CHOLECYSTECTOMY  WITH INTRAOPERATIVE CHOLANGIOGRAM;  Surgeon: Candyce Champagne, MD;  Location: [facility];  Service: General;  Laterality: N/A;   TUBAL LIGATION      Family History  Problem Relation Age of Onset   Heart disease Mother 28   Heart failure Mother    Asthma Mother    Emphysema Mother    Alzheimer's disease Father 51   Emphysema Brother    Asthma Brother    Colon cancer Neg Hx    Rectal cancer Neg Hx    Esophageal cancer Neg Hx    Pancreatic cancer Neg Hx     Social History   Socioeconomic History   Marital status: Widowed    Spouse name: Not on file   Number of children: 3   Years of education: Not on file   Highest education level: Not on file  Occupational History   Occupation: disabled  Tobacco Use   Smoking status: Every Day    Current packs/day: 1.00    Average packs/day: 1 pack/day for 47.0 years (47.0 ttl pk-yrs)    Types: Cigarettes   Smokeless tobacco: Never   Tobacco comments:    1/2 ppd 02/25/20   Vaping Use   Vaping status: Never Used  Substance and Sexual Activity   Alcohol use: No    Alcohol/week: 0.0 standard drinks of alcohol   Drug use: No   Sexual activity: Not Currently    Birth control/protection: Surgical, None  Other Topics Concern   Not on file  Social History Narrative   Not on  file   Social Drivers of Health   Financial Resource Strain: Low Risk  (01/12/2023)   Overall Financial Resource Strain (CARDIA)    Difficulty of Paying Living Expenses: Not hard at all  Food Insecurity: No Food Insecurity (01/12/2023)   Hunger Vital Sign    Worried About Running Out of Food in the Last Year: Never true    Ran Out of Food in the Last Year: Never true  Transportation Needs: No Transportation Needs (01/12/2023)   PRAPARE - Administrator, Civil Service (Medical): No    Lack of Transportation (Non-Medical): No  Physical Activity: Inactive (01/12/2023)   Exercise Vital Sign    Days of Exercise per Week: 0 days    Minutes of  Exercise per Session: 0 min  Stress: No Stress Concern Present (01/12/2023)   [employer] of Occupational Health - Occupational Stress Questionnaire    Feeling of Stress : Not at all  Social Connections: Socially Isolated (01/12/2023)   Social Connection and Isolation Panel [NHANES]    Frequency of Communication with Friends and Family: More than three times a week    Frequency of Social Gatherings with Friends and Family: More than three times a week    Attends Religious Services: Never    Database administrator or Organizations: No    Attends Banker Meetings: Never    Marital Status: Widowed    Allergies  Allergen Reactions   Aspirin Nausea Only and Other (See Comments)    GI upset/Hernia     Ciprofloxacin  Nausea Only and Other (See Comments)    Tendonitis, joint pain, and nausea.  "Interfered with her HCTZ" (also)   Codeine Nausea And Vomiting and Other (See Comments)    (Also) GI upset/Hernia (CAN TOLERATE DILAUDID  & MORPHINE )   Egg-Derived Products Nausea Only    Stomach pains   Fentanyl  Nausea Only and Other (See Comments)    Tolerates Dilaudid  or morphine  much better   Hydrocodone  Nausea And Vomiting   Lactose Intolerance (Gi) Nausea And Vomiting and Other (See Comments)    (Also) vertigo   Oxycodone  Nausea And Vomiting    Tolerates hydromorphone  better   Penicillin G Rash   Penicillins Rash    Has patient had a PCN reaction causing immediate rash, facial/tongue/throat swelling, SOB or lightheadedness with hypotension: Yes Has patient had a PCN reaction causing severe rash involving mucus membranes or skin necrosis: No Has patient had a PCN reaction that required hospitalization No Has patient had a PCN reaction occurring within the last 10 years: No If all of the above answers are "NO", then may proceed with Cephalosporin use.    Tape Rash and Other (See Comments)    Please try paper tape    Outpatient Medications Prior to Visit  Medication Sig  Dispense Refill   acetaminophen  (TYLENOL ) 500 MG tablet Take 500-1,000 mg by mouth daily as needed for headache, mild pain or fever.     albuterol  (VENTOLIN  HFA) 108 (90 Base) MCG/ACT inhaler Inhale 2 puffs into the lungs every 6 (six) hours as needed for wheezing or shortness of breath. 25.5 g 1   cimetidine (TAGAMET) 200 MG tablet Take 200 mg by mouth 2 (two) times daily as needed (stomach pain).     Fluticasone -Umeclidin-Vilant (TRELEGY ELLIPTA ) 100-62.5-25 MCG/ACT AEPB Inhale 1 puff into the lungs daily. 60 each 6   Misc. Devices MISC Pull ups - Medium.  Wipes Diagnosis- incontinence 1 Box 0   Multiple Vitamin (MULTIVITAMIN WITH  MINERALS) TABS tablet Take 1 tablet by mouth daily.     polyethylene glycol powder (MIRALAX ) 17 GM/SCOOP powder Take 17 g by mouth daily. 510 g 0   rosuvastatin  (CRESTOR ) 5 MG tablet Take 1 tablet (5 mg total) by mouth daily. 90 tablet 1   amLODipine  (NORVASC ) 5 MG tablet TAKE 1 TABLET(5 MG) BY MOUTH DAILY 90 tablet 1   azithromycin  (ZITHROMAX ) 250 MG tablet Take 1 tablet (250 mg total) by mouth daily. Take first 2 tablets together, then 1 every day until finished. 6 tablet 0   benzonatate  (TESSALON ) 100 MG capsule Take 1 capsule (100 mg total) by mouth 3 (three) times daily as needed. 30 capsule 0   lisinopril  (ZESTRIL ) 20 MG tablet TAKE 1 TABLET(20 MG) BY MOUTH DAILY 90 tablet 1   pantoprazole  (PROTONIX ) 40 MG tablet Take 1 tablet (40 mg total) by mouth daily. 90 tablet 1   hydrOXYzine  (ATARAX ) 25 MG tablet Take 1 tablet (25 mg total) by mouth 3 (three) times daily as needed. (Patient not taking: Reported on 10/24/2023) 90 tablet 1   predniSONE  (DELTASONE ) 10 MG tablet Take 4 tablets (40 mg total) by mouth daily. (Patient not taking: Reported on 10/24/2023) 20 tablet 0   No facility-administered medications prior to visit.     ROS Review of Systems  Constitutional:  Negative for activity change and appetite change.  HENT:  Negative for sinus pressure and sore  throat.   Respiratory:  Negative for chest tightness, shortness of breath and wheezing.   Cardiovascular:  Negative for chest pain and palpitations.  Gastrointestinal:  Positive for constipation. Negative for abdominal distention and abdominal pain.  Genitourinary: Negative.   Musculoskeletal:        See HPI  Psychiatric/Behavioral:  Negative for behavioral problems and dysphoric mood.     Objective:  BP (!) 146/87   Pulse 78   Ht 5\' 4"  (1.626 m)   Wt 150 lb 9.6 oz (68.3 kg)   SpO2 94%   BMI 25.85 kg/m      10/24/2023    3:24 PM 10/24/2023    2:36 PM 04/12/2023    1:42 PM  BP/Weight  Systolic BP 146 144 134  Diastolic BP 87 90 83  Wt. (Lbs)  150.6 148.2  BMI  25.85 kg/m2 25.44 kg/m2      Physical Exam Constitutional:      Appearance: She is well-developed.  Cardiovascular:     Rate and Rhythm: Normal rate.     Heart sounds: Normal heart sounds. No murmur heard. Pulmonary:     Effort: Pulmonary effort is normal.     Breath sounds: Normal breath sounds. No wheezing or rales.  Chest:     Chest wall: No tenderness.  Abdominal:     General: Bowel sounds are normal. There is no distension.     Palpations: Abdomen is soft. There is no mass.     Tenderness: There is no abdominal tenderness.     Hernia: A hernia is present.  Musculoskeletal:     Right lower leg: No edema.     Left lower leg: No edema.     Comments: No tenderness on palpation of bilateral thoracolumbar muscles and range of motion  Neurological:     Mental Status: She is alert and oriented to person, place, and time.  Psychiatric:        Mood and Affect: Mood normal.        Latest Ref Rng & Units 03/21/2023   12:23  PM 01/25/2023    3:19 PM 12/07/2022   11:55 PM  CMP  Glucose 70 - 99 mg/dL 92  403  80   BUN 8 - 23 mg/dL 5  10  6    Creatinine 0.44 - 1.00 mg/dL 4.74  2.59  5.63   Sodium 135 - 145 mmol/L 139  141  139   Potassium 3.5 - 5.1 mmol/L 3.5  4.2  3.7   Chloride 98 - 111 mmol/L 103  102   103   CO2 22 - 32 mmol/L 26  32  27   Calcium  8.9 - 10.3 mg/dL 9.1  9.4  9.1   Total Protein 6.5 - 8.1 g/dL 7.0  6.9  6.5   Total Bilirubin 0.3 - 1.2 mg/dL 0.2  0.3  0.2   Alkaline Phos 38 - 126 U/L 77  72  67   AST 15 - 41 U/L 17  15  18    ALT 0 - 44 U/L 18  14  19      Lipid Panel     Component Value Date/Time   CHOL 223 (H) 10/11/2022 1011   TRIG 142 10/11/2022 1011   HDL 50 10/11/2022 1011   CHOLHDL 3.5 06/28/2017 1157   CHOLHDL 4.7 05/09/2013 1445   VLDL 32 05/09/2013 1445   LDLCALC 147 (H) 10/11/2022 1011    CBC    Component Value Date/Time   WBC 15.7 (H) 03/21/2023 1223   RBC 5.03 03/21/2023 1223   HGB 14.0 03/21/2023 1223   HGB 15.0 05/12/2022 1549   HCT 44.3 03/21/2023 1223   HCT 47.7 (H) 05/12/2022 1549   PLT 295 03/21/2023 1223   PLT 393 05/12/2022 1549   MCV 88.1 03/21/2023 1223   MCV 88 05/12/2022 1549   MCH 27.8 03/21/2023 1223   MCHC 31.6 03/21/2023 1223   RDW 14.0 03/21/2023 1223   RDW 13.2 05/12/2022 1549   LYMPHSABS 3.0 07/27/2022 0411   LYMPHSABS 3.7 (H) 05/12/2022 1549   MONOABS 0.7 07/27/2022 0411   EOSABS 0.7 (H) 07/27/2022 0411   EOSABS 0.6 (H) 05/12/2022 1549   BASOSABS 0.1 07/27/2022 0411   BASOSABS 0.1 05/12/2022 1549    Lab Results  Component Value Date   HGBA1C 5.6 12/10/2020       Assessment & Plan COPD with emphysema Stable on inhalers. CT showed emphysema and arctic atherosclerosis no lung cancer. - Continue current inhaler regimen. - Schedule follow-up chest CT in 12 months.  Hypertension Slightly elevated blood pressure. Current regimen maintained due to low blood pressure risk. - Recheck blood pressure before leaving. - Continue current antihypertensive regimen. - Advise low sodium diet. - Encourage smoking cessation.  Hyperlipidemia Cholesterol elevated last year. On rosuvastatin  5 mg. Fasting lipid panel needed for dose evaluation. - Order fasting lipid panel for tomorrow morning. - Evaluate cholesterol levels  for rosuvastatin  dose adjustment.  GERD Pantoprazole  effective for acid reflux. - Continue pantoprazole .  Back pain Chronic thoracic back pain relieved by rest. Initially declined physical therapy, now agreed after discussing benefits. - Refer to physical therapy for evaluation and treatment. - Prescribe tizanidine  as needed for muscle relaxation.  Constipation Intermittent constipation managed with laxatives and dietary modifications. - Continue dietary modifications for constipation management. - Consider laxatives as needed.       Meds ordered this encounter  Medications   tiZANidine  (ZANAFLEX ) 4 MG tablet    Sig: Take 1 tablet (4 mg total) by mouth every 8 (eight) hours as needed.    Dispense:  60 tablet    Refill:  1   amLODipine  (NORVASC ) 5 MG tablet    Sig: Take 1 tablet (5 mg total) by mouth daily.    Dispense:  90 tablet    Refill:  1   lisinopril  (ZESTRIL ) 20 MG tablet    Sig: TAKE 1 TABLET(20 MG) BY MOUTH DAILY    Dispense:  90 tablet    Refill:  1   pantoprazole  (PROTONIX ) 40 MG tablet    Sig: Take 1 tablet (40 mg total) by mouth daily.    Dispense:  90 tablet    Refill:  1    Follow-up: Return in about 6 months (around 04/25/2024) for Chronic medical conditions.       Joaquin Mulberry, MD, FAAFP. [facility] [place], [place] [phone]   10/24/2023, 6:09 PM

## 2023-10-25 ENCOUNTER — Ambulatory Visit: Attending: Family Medicine

## 2023-10-25 VITALS — Ht 64.0 in | Wt 150.6 lb

## 2023-10-25 DIAGNOSIS — Z0000 Encounter for general adult medical examination without abnormal findings: Secondary | ICD-10-CM | POA: Diagnosis not present

## 2023-10-25 NOTE — Progress Notes (Signed)
 Because this visit was a virtual/telehealth visit,  certain criteria was not obtained, such a blood pressure, CBG if applicable, and timed get up and go. Any medications not marked as "taking" were not mentioned during the medication reconciliation part of the visit. Any vitals not documented were not able to be obtained due to this being a telehealth visit or patient was unable to self-report a recent blood pressure reading due to a lack of equipment at home via telehealth. Vitals that have been documented are verbally provided by the patient.   Subjective:   Maria Oliver is a 63 y.o. who presents for a Medicare Wellness preventive visit.  As a reminder, Annual Wellness Visits don't include a physical exam, and some assessments may be limited, especially if this visit is performed virtually. We may recommend an in-person visit if needed.  Visit Complete: Virtual I connected with  Maria Oliver on 10/25/23 by a audio enabled telemedicine application and verified that I am speaking with the correct person using two identifiers.  Patient Location: Home  Provider Location: Office/Clinic  I discussed the limitations of evaluation and management by telemedicine. The patient expressed understanding and agreed to proceed.  Vital Signs: Because this visit was a virtual/telehealth visit, some criteria may be missing or patient reported. Any vitals not documented were not able to be obtained and vitals that have been documented are patient reported.  VideoDeclined- This patient declined Librarian, academic. Therefore the visit was completed with audio only.  Persons Participating in Visit: Patient.  AWV Questionnaire: No: Patient Medicare AWV questionnaire was not completed prior to this visit.  Cardiac Risk Factors include: advanced age (>65men, >65 women);dyslipidemia;family history of premature cardiovascular disease;hypertension;sedentary lifestyle;smoking/  tobacco exposure     Objective:     Today's Vitals   10/25/23 1611  Weight: 150 lb 9.2 oz (68.3 kg)  Height: 5\' 4"  (1.626 m)  PainSc: 7   PainLoc: Teeth   Body mass index is 25.85 kg/m.     10/25/2023    4:15 PM 01/12/2023    8:04 PM 12/07/2022   11:02 PM 07/24/2022    6:53 PM 05/12/2022    3:13 PM 03/08/2021    1:09 PM 02/23/2021   10:48 PM  Advanced Directives  Does Patient Have a Medical Advance Directive? No No No No No No   Would patient like information on creating a medical advance directive? No - Patient declined Yes (MAU/Ambulatory/Procedural Areas - Information given)  No - Patient declined Yes (ED - Information included in AVS) Yes (ED - Information included in AVS) Yes (Inpatient - patient defers creating a medical advance directive and declines information at this time)    Current Medications (verified) Outpatient Encounter Medications as of 10/25/2023  Medication Sig   clindamycin  (CLEOCIN ) 300 MG capsule Take 300 mg by mouth every 6 (six) hours.   acetaminophen  (TYLENOL ) 500 MG tablet Take 500-1,000 mg by mouth daily as needed for headache, mild pain or fever.   albuterol  (VENTOLIN  HFA) 108 (90 Base) MCG/ACT inhaler Inhale 2 puffs into the lungs every 6 (six) hours as needed for wheezing or shortness of breath.   amLODipine  (NORVASC ) 5 MG tablet Take 1 tablet (5 mg total) by mouth daily.   cimetidine (TAGAMET) 200 MG tablet Take 200 mg by mouth 2 (two) times daily as needed (stomach pain).   Fluticasone -Umeclidin-Vilant (TRELEGY ELLIPTA ) 100-62.5-25 MCG/ACT AEPB Inhale 1 puff into the lungs daily.   lisinopril  (ZESTRIL ) 20 MG tablet  TAKE 1 TABLET(20 MG) BY MOUTH DAILY   Misc. Devices MISC Pull ups - Medium.  Wipes Diagnosis- incontinence   Multiple Vitamin (MULTIVITAMIN WITH MINERALS) TABS tablet Take 1 tablet by mouth daily.   pantoprazole  (PROTONIX ) 40 MG tablet Take 1 tablet (40 mg total) by mouth daily.   polyethylene glycol powder (MIRALAX ) 17 GM/SCOOP powder  Take 17 g by mouth daily.   rosuvastatin  (CRESTOR ) 5 MG tablet Take 1 tablet (5 mg total) by mouth daily.   tiZANidine  (ZANAFLEX ) 4 MG tablet Take 1 tablet (4 mg total) by mouth every 8 (eight) hours as needed.   No facility-administered encounter medications on file as of 10/25/2023.    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 09/09/2016   Generalized headaches    Hernia, inguinal, right    Hypertension    Leg swelling    Past Surgical History:  Procedure Laterality Date   COLON SURGERY  2010   per patient "colon take down"   COLOSTOMY  2010   LAPAROSCOPIC CHOLECYSTECTOMY SINGLE SITE WITH INTRAOPERATIVE CHOLANGIOGRAM N/A 09/09/2016   Procedure: LAPAROSCOPIC CHOLECYSTECTOMY  WITH INTRAOPERATIVE CHOLANGIOGRAM;  Surgeon: Candyce Champagne, MD;  Location: [facility];  Service: General;  Laterality: N/A;   TUBAL LIGATION     Family History  Problem Relation Age of Onset   Heart disease Mother 50   Heart failure Mother    Asthma Mother    Emphysema Mother    Alzheimer's disease Father 60   Emphysema Brother    Asthma Brother    Colon cancer Neg Hx    Rectal cancer Neg Hx    Esophageal cancer Neg Hx    Pancreatic cancer Neg Hx    Social History   Socioeconomic History   Marital status: Widowed    Spouse name: Not on file   Number of children: 3   Years of education: Not on file   Highest education level: Not on file  Occupational History   Occupation: disabled  Tobacco Use   Smoking  status: Every Day    Current packs/day: 1.00    Average packs/day: 1 pack/day for 47.0 years (47.0 ttl pk-yrs)    Types: Cigarettes   Smokeless tobacco: Never   Tobacco comments:    1/2 ppd 02/25/20   Vaping Use   Vaping status: Never Used  Substance and Sexual Activity   Alcohol use: No    Alcohol/week: 0.0 standard drinks of alcohol   Drug use: No   Sexual activity: Not Currently    Birth control/protection: Surgical, None  Other Topics Concern   Not on file  Social History Narrative   Not on file   Social Drivers of Health   Financial Resource Strain: Low Risk  (10/25/2023)   Overall Financial Resource Strain (CARDIA)    Difficulty of Paying Living Expenses: Not hard at all  Food Insecurity: No Food Insecurity (10/25/2023)   Hunger Vital Sign    Worried About Running Out of Food in the Last Year: Never true    Ran  Out of Food in the Last Year: Never true  Transportation Needs: No Transportation Needs (10/25/2023)   PRAPARE - Administrator, Civil Service (Medical): No    Lack of Transportation (Non-Medical): No  Physical Activity: Insufficiently Active (10/25/2023)   Exercise Vital Sign    Days of Exercise per Week: 3 days    Minutes of Exercise per Session: 30 min  Stress: No Stress Concern Present (10/25/2023)   [employer] of Occupational Health - Occupational Stress Questionnaire    Feeling of Stress : Not at all  Social Connections: Socially Isolated (10/25/2023)   Social Connection and Isolation Panel [NHANES]    Frequency of Communication with Friends and Family: More than three times a week    Frequency of Social Gatherings with Friends and Family: More than three times a week    Attends Religious Services: Never    Database administrator or Organizations: No    Attends Banker Meetings: Never    Marital Status: Widowed    Tobacco Counseling Ready to quit: Not Answered Counseling given: Not Answered Tobacco comments: 1/2 ppd  02/25/20     Clinical Intake:  Pre-visit preparation completed: Yes  Pain : No/denies pain Pain Score: 7      BMI - recorded: 25.85 Nutritional Status: BMI 25 -29 Overweight Nutritional Risks: None Diabetes: No  Lab Results  Component Value Date   HGBA1C 5.6 12/10/2020   HGBA1C 5.6 08/11/2015     How often do you need to have someone help you when you read instructions, pamphlets, or other written materials from your doctor or pharmacy?: 1 - Never  Interpreter Needed?: No  Information entered by :: Maria Wishart N. Cedrik Heindl, LPN.   Activities of Daily Living     10/25/2023    4:15 PM 01/12/2023    8:03 PM  In your present state of health, do you have any difficulty performing the following activities:  Hearing? 0 0  Vision? 0 0  Difficulty concentrating or making decisions? 0 0  Walking or climbing stairs? 0 0  Dressing or bathing? 0 0  Doing errands, shopping? 0 0  Preparing Food and eating ? N N  Using the Toilet? N N  In the past six months, have you accidently leaked urine? N N  Do you have problems with loss of bowel control? N N  Managing your Medications? N N  Managing your Finances? N N  Housekeeping or managing your Housekeeping? N N    Patient Care Team: Joaquin Mulberry, MD as PCP - General (Family Medicine) Alvis Jourdain, MD as Consulting Physician (Gastroenterology) Candyce Champagne, MD as Consulting Physician (General Surgery) Arvil Birks, MD as Consulting Physician (Orthopedic Surgery) Lajuan Pila, MD as Consulting Physician (Gastroenterology)  Indicate any recent Medical Services you may have received from other than [facility] providers in the past year (date may be approximate).     Assessment:    This is a routine wellness examination for Maria Oliver.  Hearing/Vision screen Hearing Screening - Comments:: Denies hearing difficulties.  Vision Screening - Comments:: Wears rx glasses - up to date with routine eye exams with [facility]    Goals  Addressed               This Visit's Progress     None at this time. (pt-stated)         Depression Screen     10/25/2023    4:21 PM 10/24/2023    2:40 PM 04/12/2023  1:44 PM 01/12/2023    8:00 PM 10/11/2022    9:11 AM 05/27/2022    3:15 PM 05/12/2022    3:12 PM  PHQ 2/9 Scores  PHQ - 2 Score 4 4 4 2 2 3 4   PHQ- 9 Score 9 9 15 5 5 7 10     Fall Risk     10/25/2023    4:14 PM 10/24/2023    2:39 PM 04/12/2023    1:43 PM 01/12/2023    8:02 PM 10/11/2022    9:11 AM  Fall Risk   Falls in the past year? 0 0 0 0 0  Number falls in past yr: 0  0 0 0  Injury with Fall? 0 0 0 0 0  Risk for fall due to : No Fall Risks No Fall Risks No Fall Risks No Fall Risks No Fall Risks  Follow up Falls prevention discussed;Falls evaluation completed  Falls evaluation completed Falls prevention discussed;Education provided;Falls evaluation completed     MEDICARE RISK AT HOME:  Medicare Risk at Home Any stairs in or around the home?: No If so, are there any without handrails?: No Home free of loose throw rugs in walkways, pet beds, electrical cords, etc?: Yes Adequate lighting in your home to reduce risk of falls?: Yes Life alert?: No Use of a cane, walker or w/c?: No Grab bars in the bathroom?: Yes Shower chair or bench in shower?: No Elevated toilet seat or a handicapped toilet?: No  TIMED UP AND GO:  Was the test performed?  No  Cognitive Function: 6CIT completed    10/25/2023    4:16 PM  MMSE - Mini Mental State Exam  Not completed: Unable to complete        10/25/2023    4:19 PM 01/12/2023    8:03 PM 03/08/2021    1:06 PM  6CIT Screen  What Year? 0 points 0 points 0 points  What month? 0 points 0 points 0 points  What time? 0 points 0 points 0 points  Count back from 20 0 points 0 points 0 points  Months in reverse 0 points 2 points 0 points  Repeat phrase 0 points 0 points 2 points  Total Score 0 points 2 points 2 points    Immunizations Immunization History   Administered Date(s) Administered   Tdap 10/07/2013    Screening Tests Health Maintenance  Topic Date Due   COVID-19 Vaccine (1 - 2024-25 season) Never done   Zoster Vaccines- Shingrix (1 of 2) 01/24/2024 (Originally 08/07/2008)   DTaP/Tdap/Td (2 - Td or Tdap) 10/23/2024 (Originally 10/08/2023)   Pneumonia Vaccine 62+ Years old (1 of 2 - PCV) 10/23/2024 (Originally [birth year]1977)   INFLUENZA VACCINE  01/13/2024   MAMMOGRAM  07/08/2024   Lung Cancer Screening  09/07/2024   Medicare Annual Wellness (AWV)  10/24/2024   Cervical Cancer Screening (HPV/Pap Cotest)  11/13/2025   DEXA SCAN  Completed   Hepatitis C Screening  Completed   HIV Screening  Completed   HPV VACCINES  Aged Out   Meningococcal B Vaccine  Aged Out   Colonoscopy  Discontinued    Health Maintenance  Health Maintenance Due  Topic Date Due   COVID-19 Vaccine (1 - 2024-25 season) Never done    Health Maintenance Items Addressed: Yes Patient aware of current care gaps.  Patient declined vaccines.  Additional Screening:  Vision Screening: Recommended annual ophthalmology exams for early detection of glaucoma and other disorders of the eye.  Dental Screening: Recommended annual  dental exams for proper oral hygiene  Community Resource Referral / Chronic Care Management: CRR required this visit?  No   CCM required this visit?  No   Plan:    I have personally reviewed and noted the following in the patient's chart:   Medical and social history Use of alcohol, tobacco or illicit drugs  Current medications and supplements including opioid prescriptions. Patient is not currently taking opioid prescriptions. Functional ability and status Nutritional status Physical activity Advanced directives List of other physicians Hospitalizations, surgeries, and ER visits in previous 12 months Vitals Screenings to include cognitive, depression, and falls Referrals and appointments  In addition, I have reviewed and  discussed with patient certain preventive protocols, quality metrics, and best practice recommendations. A written personalized care plan for preventive services as well as general preventive health recommendations were provided to patient.   Margette Sheldon, LPN   09/20/8117   After Visit Summary: (MyChart) Due to this being a telephonic visit, the after visit summary with patients personalized plan was offered to patient via MyChart   Notes: Patient aware of current care gaps. Patient declined vaccines.

## 2023-10-25 NOTE — Patient Instructions (Signed)
 Ms. Kunzler , Thank you for taking time out of your busy schedule to complete your Annual Wellness Visit with me. I enjoyed our conversation and look forward to speaking with you again next year. I, as well as your care team,  appreciate your ongoing commitment to your health goals. Please review the following plan we discussed and let me know if I can assist you in the future. Your Game plan/ To Do List    Referrals: If you haven't heard from the office you've been referred to, please reach out to them at the phone provided.   Follow up Visits: Next Medicare AWV with our clinical staff: 10/30/2024 at 4:10 pm PHONE VISIT   Have you seen your provider in the last 6 months (3 months if uncontrolled diabetes)? Yes Next Office Visit with your provider: 04/25/2024 at 2:10 pm OFFICE VISIT  Clinician Recommendations:  Aim for 30 minutes of exercise or brisk walking, 6-8 glasses of water , and 5 servings of fruits and vegetables each day.       This is a list of the screening recommended for you and due dates:  Health Maintenance  Topic Date Due   COVID-19 Vaccine (1 - 2024-25 season) Never done   Zoster (Shingles) Vaccine (1 of 2) 01/24/2024*   DTaP/Tdap/Td vaccine (2 - Td or Tdap) 10/23/2024*   Pneumonia Vaccine (1 of 2 - PCV) 10/23/2024*   Flu Shot  01/13/2024   Mammogram  07/08/2024   Screening for Lung Cancer  09/07/2024   Medicare Annual Wellness Visit  10/24/2024   Pap with HPV screening  11/13/2025   DEXA scan (bone density measurement)  Completed   Hepatitis C Screening  Completed   HIV Screening  Completed   HPV Vaccine  Aged Out   Meningitis B Vaccine  Aged Out   Colon Cancer Screening  Discontinued  *Topic was postponed. The date shown is not the original due date.    Advanced directives: (Declined) Advance directive discussed with you today. Even though you declined this today, please call our office should you change your mind, and we can give you the proper paperwork for you  to fill out. Advance Care Planning is important because it:  [x]  Makes sure you receive the medical care that is consistent with your values, goals, and preferences  [x]  It provides guidance to your family and loved ones and reduces their decisional burden about whether or not they are making the right decisions based on your wishes.  Follow the link provided in your after visit summary or read over the paperwork we have mailed to you to help you started getting your Advance Directives in place. If you need assistance in completing these, please reach out to us  so that we can help you!  See attachments for Preventive Care and Fall Prevention Tips.

## 2023-10-26 ENCOUNTER — Ambulatory Visit: Payer: Self-pay | Admitting: Family Medicine

## 2023-10-26 LAB — CMP14+EGFR
ALT: 13 IU/L (ref 0–32)
AST: 16 IU/L (ref 0–40)
Albumin: 4.3 g/dL (ref 3.9–4.9)
Alkaline Phosphatase: 82 IU/L (ref 44–121)
BUN/Creatinine Ratio: 11 — ABNORMAL LOW (ref 12–28)
BUN: 7 mg/dL — ABNORMAL LOW (ref 8–27)
Bilirubin Total: <0.2 mg/dL (ref 0.0–1.2)
CO2: 24 mmol/L (ref 20–29)
Calcium: 9.2 mg/dL (ref 8.7–10.3)
Chloride: 103 mmol/L (ref 96–106)
Creatinine, Ser: 0.64 mg/dL (ref 0.57–1.00)
Globulin, Total: 2.2 g/dL (ref 1.5–4.5)
Glucose: 99 mg/dL (ref 70–99)
Potassium: 4.1 mmol/L (ref 3.5–5.2)
Sodium: 141 mmol/L (ref 134–144)
Total Protein: 6.5 g/dL (ref 6.0–8.5)
eGFR: 98 mL/min/{1.73_m2} (ref >59)

## 2023-10-26 LAB — HEMOGLOBIN A1C
Est. average glucose Bld gHb Est-mCnc: 120 mg/dL
Hgb A1c MFr Bld: 5.8 % — ABNORMAL HIGH (ref 4.8–5.6)

## 2023-10-26 LAB — LP+NON-HDL CHOLESTEROL
Cholesterol, Total: 161 mg/dL (ref 100–199)
HDL: 56 mg/dL (ref >39)
LDL Chol Calc (NIH): 90 mg/dL (ref 0–99)
Total Non-HDL-Chol (LDL+VLDL): 105 mg/dL (ref 0–129)
Triglycerides: 81 mg/dL (ref 0–149)
VLDL Cholesterol Cal: 15 mg/dL (ref 5–40)

## 2023-10-26 LAB — CBC WITH DIFFERENTIAL/PLATELET
Basophils Absolute: 0.1 10*3/uL (ref 0.0–0.2)
Basos: 1 %
EOS (ABSOLUTE): 0.5 10*3/uL — ABNORMAL HIGH (ref 0.0–0.4)
Eos: 5 %
Hematocrit: 46 % (ref 34.0–46.6)
Hemoglobin: 14.8 g/dL (ref 11.1–15.9)
Immature Grans (Abs): 0 10*3/uL (ref 0.0–0.1)
Immature Granulocytes: 0 %
Lymphocytes Absolute: 3.1 10*3/uL (ref 0.7–3.1)
Lymphs: 34 %
MCH: 28.2 pg (ref 26.6–33.0)
MCHC: 32.2 g/dL (ref 31.5–35.7)
MCV: 88 fL (ref 79–97)
Monocytes Absolute: 0.7 10*3/uL (ref 0.1–0.9)
Monocytes: 8 %
Neutrophils Absolute: 4.8 10*3/uL (ref 1.4–7.0)
Neutrophils: 52 %
Platelets: 328 10*3/uL (ref 150–450)
RBC: 5.24 x10E6/uL (ref 3.77–5.28)
RDW: 13.9 % (ref 11.7–15.4)
WBC: 9.1 10*3/uL (ref 3.4–10.8)

## 2023-10-31 ENCOUNTER — Ambulatory Visit (INDEPENDENT_AMBULATORY_CARE_PROVIDER_SITE_OTHER): Admitting: Nurse Practitioner

## 2023-10-31 ENCOUNTER — Encounter: Payer: Self-pay | Admitting: Nurse Practitioner

## 2023-10-31 ENCOUNTER — Ambulatory Visit (INDEPENDENT_AMBULATORY_CARE_PROVIDER_SITE_OTHER)

## 2023-10-31 VITALS — BP 130/80 | HR 94 | Ht 64.0 in | Wt 147.2 lb

## 2023-10-31 DIAGNOSIS — J441 Chronic obstructive pulmonary disease with (acute) exacerbation: Secondary | ICD-10-CM

## 2023-10-31 DIAGNOSIS — J329 Chronic sinusitis, unspecified: Secondary | ICD-10-CM | POA: Diagnosis not present

## 2023-10-31 DIAGNOSIS — R0609 Other forms of dyspnea: Secondary | ICD-10-CM

## 2023-10-31 DIAGNOSIS — J449 Chronic obstructive pulmonary disease, unspecified: Secondary | ICD-10-CM

## 2023-10-31 MED ORDER — TRELEGY ELLIPTA 100-62.5-25 MCG/ACT IN AEPB
1.0000 | INHALATION_SPRAY | Freq: Every day | RESPIRATORY_TRACT | 5 refills | Status: AC
Start: 2023-10-31 — End: ?

## 2023-10-31 MED ORDER — PREDNISONE 20 MG PO TABS
40.0000 mg | ORAL_TABLET | Freq: Every day | ORAL | 0 refills | Status: AC
Start: 2023-10-31 — End: 2023-11-05

## 2023-10-31 MED ORDER — DOXYCYCLINE HYCLATE 100 MG PO TABS: 100.0000 mg | ORAL_TABLET | Freq: Two times a day (BID) | ORAL | 0 refills | Status: AC

## 2023-10-31 MED ORDER — FLUTICASONE PROPIONATE 50 MCG/ACT NA SUSP: 1.0000 | Freq: Every day | NASAL | 2 refills | Status: AC

## 2023-10-31 NOTE — Progress Notes (Signed)
 @Patient  ID: Maria Oliver, female    DOB: 1960/09/05, 63 y.o.   MRN: [id number]  Chief Complaint  Patient presents with   Follow-up    Patient is having sob, coughing up phlegm and nasal dripping.    Referring provider: Joaquin Mulberry, MD  HPI: 63 year old female, current smoker followed for COPD with emphysema and asthma.  Patient of Dr. Cari Char and last seen 01/31/2023 by Cleo Dace NP.  Past medical history significant for hypertension, chronic pain syndrome, and anxiety.    TESTS/EVENTS: 07/29/2020 LDCT: Lung RADS 2, benign appearance or behavior. Centrilobular and paraseptal emphysema. Unchanged chronic scarring in lingula. New geographic ares of groundglass attenuation both lower lungs favored to represent sequelae of infection or inflammation.  02/22/2021 CTA chest/abd/pel for dissection: no evidence of thoracic aortic aneurysm or dissection. Arthrosclerotic calcifications of the aortic arch. No PE noted. Heart size normal. No lymphadenopathy. 8 mm right thyroid  nodule not clinically sig with no f/u recommended. Mild centrilobular and paraseptal emphysematous changes, upper lung predominant. Scarring/atelectasis in the lingula and bilat lower lobes. 15 mm ground-glass nodule in the posterior right upper lobe unchanged from previous exam. Additional scatter ground-glass nodules are unchanged and better evaluated on LDCT. No evidence of abd aortic aneurysm or dissection. Large lower midline ventral hernia without associated inflammatory changes. No evidence of bowel obstruction.  05/06/2021 CXR 2 View: Hyperinflation without acute cardiopulmonary disease. Scarring present left lung base.  09/07/2022 CT chest w contrast: minimal pericardial effusion. Atherosclerosis. Esophagus decompressed. Emphysematous changes in lungs. Atelectasis in lung bases. Focal area of patchy peribronchovascular ground glass in RUL, 1.7 cm and unchanged.  09/08/2023 LDCT chest: CAD. Atherosclerosis.    09/05/2020:  Virtual visit with Eulas Hick, NP.  Productive cough with thick yellow mucus x2 days.  Shoulder pain, chest tightness and wheezing.  Increased rescue inhaler use.  COPD exacerbation.  Rx doxycycline  x7 days and prednisone  taper.  Mucinex  twice daily.  Continue Advair  twice daily.   05/06/2021: ED visit. Cough, congestion, headaches, dizziness and nausea x 2 days. CT head neg. RVP neg. CXR hyperinflation without acute illness/disease. Diagnosed with URI and discharged with rx for zofran .   05/11/2021: OV with Niyla Marone NP via virtual visit for reported cough, congestion, and headaches about six days ago. Her symptoms have persisted and have not improved despite being treated in the ED on 11/23. She reports yellow sputum production and yellow rhinorrhea. She has also been experiencing intermittent episodes of dizziness and nausea. No vomiting or syncope. Recent CT head negative. She denies shortness of breath, wheezing, visual disturbances, orthopnea, PND, CP, or leg swelling. No fever, no chills, or body aches. She has been taking tylenol  as needed for headaches with moderate relief. She was prescribed zofran  in the ED for nausea but has not been using it. Continues on Advair  twice daily. She is utilizing her rescue inhaler maybe once a day. Overall, she feels poorly but reports that her breathing is stable.   01/31/2023: OV with Sandrina Heaton NP for overdue follow up. She has been doing okay since her last visit. She does feel like her breathing limits her with longer distance walking and she can't seem to keep up with her son as well as she used to. She's able to complete ADLs, grocery shop and household chores without trouble. She has a daily chronic cough, which is occasionally productive with white phlegm. No wheezing or chest congestion. She ran out of her maintenance inhalers a while ago. She's been using albuterol  a few times  a week, sometimes daily. She is still smoking. Not ready to quit due to life stressors.  No hemoptysis, anorexia, weight loss.   10/31/2023: Today - follow up Discussed the use of AI scribe software for clinical note transcription with the patient, who gave verbal consent to proceed.  History of Present Illness   Maria Oliver is a 63 year old female with COPD and emphysema who presents with worsening breathing difficulties.  She has experienced worsening breathing difficulties over the past few months, with increased reliance on her rescue inhaler and irregular use of her Trelegy inhaler. Her symptoms have progressively worsened over the past month, accompanied by coughing up yellow sputum and clear nasal discharge from one nostril. No fever, chills, or hemoptysis. She has not used any nasal sprays recently but has previously used Flonase .  She recently completed a course of clindamycin  for dental issues but discontinued it due to nausea and stomach upset.   She experiences muscle and bone pain, which she attributes to her rosuvastatin  so she stopped this as well and symptoms improved. She hasn't let her PCP know yet.   She has a long history of smoking since sixth grade, currently smoking about half a pack a day. She acknowledges the impact of smoking on her lung health and has been attempting to quit.  She reports chronic fatigue as well and has a relatively high symptom burden.        Allergies  Allergen Reactions   Aspirin Nausea Only and Other (See Comments)    GI upset/Hernia     Ciprofloxacin  Nausea Only and Other (See Comments)    Tendonitis, joint pain, and nausea.  "Interfered with her HCTZ" (also)   Codeine Nausea And Vomiting and Other (See Comments)    (Also) GI upset/Hernia (CAN TOLERATE DILAUDID  & MORPHINE )   Egg-Derived Products Nausea Only    Stomach pains   Fentanyl  Nausea Only and Other (See Comments)    Tolerates Dilaudid  or morphine  much better   Hydrocodone  Nausea And Vomiting   Lactose Intolerance (Gi) Nausea And Vomiting and Other (See  Comments)    (Also) vertigo   Oxycodone  Nausea And Vomiting    Tolerates hydromorphone  better   Penicillin G Rash   Penicillins Rash    Has patient had a PCN reaction causing immediate rash, facial/tongue/throat swelling, SOB or lightheadedness with hypotension: Yes Has patient had a PCN reaction causing severe rash involving mucus membranes or skin necrosis: No Has patient had a PCN reaction that required hospitalization No Has patient had a PCN reaction occurring within the last 10 years: No If all of the above answers are "NO", then may proceed with Cephalosporin use.    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 09/09/2016   Generalized headaches    Hernia, inguinal, right    Hypertension    Leg swelling     Tobacco History: Social History   Tobacco Use  Smoking Status Every Day   Current packs/day: 1.00  Average packs/day: 1 pack/day for 47.0 years (47.0 ttl pk-yrs)   Types: Cigarettes  Smokeless Tobacco Never  Tobacco Comments   1/2 ppd 10/31/2023   Ready to quit: Not Answered Counseling given: Not Answered Tobacco comments: 1/2 ppd 10/31/2023   Outpatient Medications Prior to Visit  Medication Sig Dispense Refill   acetaminophen  (TYLENOL ) 500 MG tablet Take 500-1,000 mg by mouth daily as needed for headache, mild pain or fever.     albuterol  (VENTOLIN  HFA) 108 (90 Base) MCG/ACT inhaler Inhale 2 puffs into the lungs every 6 (six) hours as needed for wheezing or shortness of breath. 25.5 g 1   amLODipine   (NORVASC ) 5 MG tablet Take 1 tablet (5 mg total) by mouth daily. 90 tablet 1   cimetidine (TAGAMET) 200 MG tablet Take 200 mg by mouth 2 (two) times daily as needed (stomach pain).     lisinopril  (ZESTRIL ) 20 MG tablet TAKE 1 TABLET(20 MG) BY MOUTH DAILY 90 tablet 1   Misc. Devices MISC Pull ups - Medium.  Wipes Diagnosis- incontinence 1 Box 0   Multiple Vitamin (MULTIVITAMIN WITH MINERALS) TABS tablet Take 1 tablet by mouth daily.     pantoprazole  (PROTONIX ) 40 MG tablet Take 1 tablet (40 mg total) by mouth daily. 90 tablet 1   polyethylene glycol powder (MIRALAX ) 17 GM/SCOOP powder Take 17 g by mouth daily. 510 g 0   rosuvastatin  (CRESTOR ) 5 MG tablet Take 1 tablet (5 mg total) by mouth daily. 90 tablet 1   tiZANidine  (ZANAFLEX ) 4 MG tablet Take 1 tablet (4 mg total) by mouth every 8 (eight) hours as needed. 60 tablet 1   Fluticasone -Umeclidin-Vilant (TRELEGY ELLIPTA ) 100-62.5-25 MCG/ACT AEPB Inhale 1 puff into the lungs daily. 60 each 6   clindamycin  (CLEOCIN ) 300 MG capsule Take 300 mg by mouth every 6 (six) hours. (Patient not taking: Reported on 10/31/2023)     No facility-administered medications prior to visit.     Review of Systems:   Constitutional: No weight loss or gain, night sweats, fevers, chills, or lassitude. +fatigue (baseline) HEENT: No headaches, difficulty swallowing, or sore throat. No sneezing, itching, ear ache +nasal congestion, post nasal drip, tooth/dental problems CV:  No chest pain, orthopnea, PND, swelling in lower extremities, anasarca, dizziness, palpitations, syncope Resp: +shortness of breath with exertion; productive cough; wheezing. No hemoptysis. No chest wall deformity GI:  No heartburn, indigestion GU: No dysuria, change in color of urine, urgency or frequency.   Skin: No rash, lesions, ulcerations MSK:  No joint pain or swelling.   Neuro: No dizziness or lightheadedness.  Psych: No depression or anxiety. Mood stable.     Physical Exam:  BP  130/80 (BP Location: Right Arm, Patient Position: Sitting, Cuff Size: Normal)   Pulse 94   Ht 5\' 4"  (1.626 m)   Wt 147 lb 3.2 oz (66.8 kg)   SpO2 99%   BMI 25.27 kg/m   GEN: Pleasant, interactive, well-kempt; in no acute distress. HEENT:  Normocephalic and atraumatic. PERRLA. Sclera white. Nasal turbinates erythematous, moist and patent bilaterally. No rhinorrhea present. Oropharynx pink and moist, without exudate or edema. No lesions, ulcerations, or postnasal drip.  NECK:  Supple w/ fair ROM. No JVD present. Normal carotid impulses w/o bruits. Thyroid  symmetrical with no goiter or nodules palpated. No lymphadenopathy.   CV: RRR, no m/r/g, no peripheral edema. Pulses intact, +2 bilaterally. No cyanosis, pallor or clubbing. PULMONARY:  Unlabored, regular breathing. End expiratory wheezes bilaterally A&P. Congested cough. No accessory muscle use.  GI:  BS present and normoactive. Soft, non-tender to palpation. No organomegaly or masses detected. MSK: No erythema, warmth or tenderness. Cap refil <2 sec all extrem. No deformities or joint swelling noted.  Neuro: A/Ox3. No focal deficits noted.   Skin: Warm, no lesions or rashe Psych: Normal affect and behavior. Judgement and thought content appropriate.     Lab Results:  CBC    Component Value Date/Time   WBC 9.1 10/25/2023 0900   WBC 15.7 (H) 03/21/2023 1223   RBC 5.24 10/25/2023 0900   RBC 5.03 03/21/2023 1223   HGB 14.8 10/25/2023 0900   HCT 46.0 10/25/2023 0900   PLT 328 10/25/2023 0900   MCV 88 10/25/2023 0900   MCH 28.2 10/25/2023 0900   MCH 27.8 03/21/2023 1223   MCHC 32.2 10/25/2023 0900   MCHC 31.6 03/21/2023 1223   RDW 13.9 10/25/2023 0900   LYMPHSABS 3.1 10/25/2023 0900   MONOABS 0.7 07/27/2022 0411   EOSABS 0.5 (H) 10/25/2023 0900   BASOSABS 0.1 10/25/2023 0900    BMET    Component Value Date/Time   NA 141 10/25/2023 0900   K 4.1 10/25/2023 0900   CL 103 10/25/2023 0900   CO2 24 10/25/2023 0900   GLUCOSE 99  10/25/2023 0900   GLUCOSE 92 03/21/2023 1223   BUN 7 (L) 10/25/2023 0900   CREATININE 0.64 10/25/2023 0900   CREATININE 0.67 08/11/2015 1054   CALCIUM  9.2 10/25/2023 0900   GFRNONAA >60 03/21/2023 1223   GFRNONAA >89 08/11/2015 1054   GFRAA >60 01/09/2020 2253   GFRAA >89 08/11/2015 1054    BNP    Component Value Date/Time   BNP 165.0 (H) 02/24/2021 0224     Imaging:  DG Chest 2 View Result Date: 10/31/2023 CLINICAL DATA:  Productive cough and shortness of breath. EXAM: CHEST - 2 VIEW COMPARISON:  Radiograph 03/21/2023.  CT 09/08/2023 FINDINGS: Chronic hyperinflation and bronchial thickening. Subsegmental atelectasis in the lung bases without confluent airspace disease. No pulmonary edema, pleural effusion, or pneumothorax. Bilateral shoulder arthropathy. IMPRESSION: Chronic hyperinflation and bronchial thickening. Subsegmental atelectasis in the lung bases. Electronically Signed   By: Chadwick Colonel M.D.   On: 10/31/2023 17:22    Administration History     None          Latest Ref Rng & Units 08/02/2014    8:39 AM  PFT Results  FVC-Pre L 2.47   FVC-Predicted Pre % 75   FVC-Post L 2.76   FVC-Predicted Post % 83   Pre FEV1/FVC % % 66   Post FEV1/FCV % % 71   FEV1-Pre L 1.63   FEV1-Predicted Pre % 63   FEV1-Post L 1.95   DLCO uncorrected ml/min/mmHg 12.42   DLCO UNC% % 54   DLCO corrected ml/min/mmHg 12.50   DLCO COR %Predicted % 54   DLVA Predicted % 62   TLC L 5.72   TLC % Predicted % 116   RV % Predicted % 180     No results found for: "NITRICOXIDE"      Assessment & Plan:   COPD with acute exacerbation (HCC) AECOPD. Will treat her with prednisone  burst and empiric doxycycline . Advised on light hypersensitivity while on doxycycline . Advised on importance of compliance with maintenance inhaler and instructed to resume Trelegy. Verbalized understanding. Will reassess at follow up. Action plan in place. Encouraged to work on graded exercises. Smoking  cessation strongly advised.   Patient Instructions  Continue Albuterol  inhaler 2 puffs or 3 mL neb every 6 hours  as needed for shortness of breath or wheezing. Notify if symptoms persist despite rescue inhaler/neb use.  Restart Trelegy 1 puff daily. Brush tongue and rinse mouth afterwards Continue pantoprazole  1 daily   Restart flonase  nasal spray 2 sprays each nostril daily Prednisone  40 mg daily for 5 days. Take in AM with food Doxycycline  1 tab Twice daily for 7 days. Take with food  Guaifenesin  600 mg Twice daily for congestion   Chest x ray today    Follow up in 6 weeks after PFT with Dr. Marygrace Snellen (1st) or Katie Molley Houser,NP. If symptoms do not improve or worsen, please contact office for sooner follow up or seek emergency care.    Chronic sinusitis Suspect a component of her cough is related to postnasal drainage/upper airway irritation. Resume intranasal steroid.     I spent 35 minutes of dedicated to the care of this patient on the date of this encounter to include pre-visit review of records, face-to-face time with the patient discussing conditions above, post visit ordering of testing, clinical documentation with the electronic health record, making appropriate referrals as documented, and communicating necessary findings to members of the patients care team.  Roetta Clarke, NP 11/02/2023  Pt aware and understands NP's role.

## 2023-10-31 NOTE — Patient Instructions (Addendum)
 Continue Albuterol  inhaler 2 puffs or 3 mL neb every 6 hours as needed for shortness of breath or wheezing. Notify if symptoms persist despite rescue inhaler/neb use.  Restart Trelegy 1 puff daily. Brush tongue and rinse mouth afterwards Continue pantoprazole  1 daily   Restart flonase  nasal spray 2 sprays each nostril daily Prednisone  40 mg daily for 5 days. Take in AM with food Doxycycline  1 tab Twice daily for 7 days. Take with food  Guaifenesin  600 mg Twice daily for congestion   Chest x ray today    Follow up in 6 weeks after PFT with Dr. Marygrace Snellen (1st) or Katie Gabriell Daigneault,NP. If symptoms do not improve or worsen, please contact office for sooner follow up or seek emergency care.

## 2023-11-02 ENCOUNTER — Encounter: Payer: Self-pay | Admitting: Nurse Practitioner

## 2023-11-02 DIAGNOSIS — J329 Chronic sinusitis, unspecified: Secondary | ICD-10-CM | POA: Insufficient documentation

## 2023-11-02 NOTE — Assessment & Plan Note (Signed)
 Suspect a component of her cough is related to postnasal drainage/upper airway irritation. Resume intranasal steroid.

## 2023-11-02 NOTE — Assessment & Plan Note (Signed)
 AECOPD. Will treat her with prednisone  burst and empiric doxycycline . Advised on light hypersensitivity while on doxycycline . Advised on importance of compliance with maintenance inhaler and instructed to resume Trelegy. Verbalized understanding. Will reassess at follow up. Action plan in place. Encouraged to work on graded exercises. Smoking cessation strongly advised.   Patient Instructions  Continue Albuterol  inhaler 2 puffs or 3 mL neb every 6 hours as needed for shortness of breath or wheezing. Notify if symptoms persist despite rescue inhaler/neb use.  Restart Trelegy 1 puff daily. Brush tongue and rinse mouth afterwards Continue pantoprazole  1 daily   Restart flonase  nasal spray 2 sprays each nostril daily Prednisone  40 mg daily for 5 days. Take in AM with food Doxycycline  1 tab Twice daily for 7 days. Take with food  Guaifenesin  600 mg Twice daily for congestion   Chest x ray today    Follow up in 6 weeks after PFT with Dr. Marygrace Snellen (1st) or Katie Mical Brun,NP. If symptoms do not improve or worsen, please contact office for sooner follow up or seek emergency care.

## 2023-11-04 ENCOUNTER — Ambulatory Visit: Payer: Self-pay | Admitting: Nurse Practitioner

## 2023-11-15 ENCOUNTER — Encounter

## 2023-11-18 ENCOUNTER — Ambulatory Visit (HOSPITAL_COMMUNITY)
Admission: EM | Admit: 2023-11-18 | Discharge: 2023-11-18 | Disposition: A | Discharge status: Home / Self Care | Attending: Family Medicine | Admitting: Family Medicine

## 2023-11-18 ENCOUNTER — Ambulatory Visit: Payer: Self-pay

## 2023-11-18 ENCOUNTER — Encounter (HOSPITAL_COMMUNITY): Payer: Self-pay

## 2023-11-18 DIAGNOSIS — M7918 Myalgia, other site: Secondary | ICD-10-CM

## 2023-11-18 HISTORY — DX: Emphysema, unspecified: J43.9

## 2023-11-18 LAB — POCT URINALYSIS DIP (MANUAL ENTRY)
Bilirubin, UA: NEGATIVE
Blood, UA: NEGATIVE
Glucose, UA: NEGATIVE mg/dL
Ketones, POC UA: NEGATIVE mg/dL
Leukocytes, UA: NEGATIVE
Nitrite, UA: NEGATIVE
Spec Grav, UA: 1.02 (ref 1.010–1.025)
Urobilinogen, UA: 0.2 U/dL
pH, UA: 6 (ref 5.0–8.0)

## 2023-11-18 LAB — POCT FASTING CBG KUC MANUAL ENTRY: POCT Glucose (KUC): 96 mg/dL (ref 70–99)

## 2023-11-18 MED ORDER — DICLOFENAC SODIUM 50 MG PO TBEC: 50.0000 mg | DELAYED_RELEASE_TABLET | Freq: Two times a day (BID) | ORAL | 1 refills | Status: DC

## 2023-11-18 MED ORDER — BACLOFEN 10 MG PO TABS: 10.0000 mg | ORAL_TABLET | Freq: Three times a day (TID) | ORAL | 0 refills | Status: DC

## 2023-11-18 NOTE — Telephone Encounter (Signed)
 Noted

## 2023-11-18 NOTE — ED Provider Notes (Signed)
 [facility]  Note:  This document was prepared using Dragon voice recognition software and may include unintentional dictation errors.  MRN: [id number] DOB: [birth year]  Subjective:   Maria Oliver is a 63 y.o. female presenting for left lower back pain, left flank frequency fatigue x 2 days.  Patient also reports new onset left lateral neck tension pain that started yesterday.  Patient states that she is taken Tylenol  with no improvement to her symptoms.  Patient denies any known injury or trauma.  Patient is concerned that she may have a urinary tract infection and would like urine testing today in urgent care.  Patient denies any fever, shortness of breath, chest pain, dizziness.  No current facility-administered medications for this encounter.  Current Outpatient Medications:    baclofen (LIORESAL) 10 MG tablet, Take 1 tablet (10 mg total) by mouth 3 (three) times daily., Disp: 30 each, Rfl: 0   diclofenac  (VOLTAREN ) 50 MG EC tablet, Take 1 tablet (50 mg total) by mouth 2 (two) times daily., Disp: 30 tablet, Rfl: 1   acetaminophen  (TYLENOL ) 500 MG tablet, Take 500-1,000 mg by mouth daily as needed for headache, mild pain or fever., Disp: , Rfl:    albuterol  (VENTOLIN  HFA) 108 (90 Base) MCG/ACT inhaler, Inhale 2 puffs into the lungs every 6 (six) hours as needed for wheezing or shortness of breath., Disp: 25.5 g, Rfl: 1   amLODipine  (NORVASC ) 5 MG tablet, Take 1 tablet (5 mg total) by mouth daily., Disp: 90 tablet, Rfl: 1   cimetidine (TAGAMET) 200 MG tablet, Take 200 mg by mouth 2 (two) times daily as needed (stomach pain)., Disp: , Rfl:    clindamycin  (CLEOCIN ) 300 MG capsule, Take 300 mg by mouth every 6 (six) hours. (Patient not taking: Reported on 10/31/2023), Disp: , Rfl:    fluticasone  (FLONASE ) 50 MCG/ACT nasal spray, Place 1 spray into both nostrils daily., Disp: 18.2 mL, Rfl: 2   Fluticasone -Umeclidin-Vilant (TRELEGY ELLIPTA ) 100-62.5-25 MCG/ACT AEPB, Inhale 1 puff  into the lungs daily., Disp: 60 each, Rfl: 5   lisinopril  (ZESTRIL ) 20 MG tablet, TAKE 1 TABLET(20 MG) BY MOUTH DAILY, Disp: 90 tablet, Rfl: 1   Misc. Devices MISC, Pull ups - Medium.  Wipes Diagnosis- incontinence, Disp: 1 Box, Rfl: 0   Multiple Vitamin (MULTIVITAMIN WITH MINERALS) TABS tablet, Take 1 tablet by mouth daily., Disp: , Rfl:    pantoprazole  (PROTONIX ) 40 MG tablet, Take 1 tablet (40 mg total) by mouth daily., Disp: 90 tablet, Rfl: 1   polyethylene glycol powder (MIRALAX ) 17 GM/SCOOP powder, Take 17 g by mouth daily., Disp: 510 g, Rfl: 0   rosuvastatin  (CRESTOR ) 5 MG tablet, Take 1 tablet (5 mg total) by mouth daily., Disp: 90 tablet, Rfl: 1   tiZANidine  (ZANAFLEX ) 4 MG tablet, Take 1 tablet (4 mg total) by mouth every 8 (eight) hours as needed., Disp: 60 tablet, Rfl: 1   Allergies  Allergen Reactions   Aspirin Nausea Only and Other (See Comments)    GI upset/Hernia     Ciprofloxacin  Nausea Only and Other (See Comments)    Tendonitis, joint pain, and nausea.  "Interfered with her HCTZ" (also)   Codeine Nausea And Vomiting and Other (See Comments)    (Also) GI upset/Hernia (CAN TOLERATE DILAUDID  & MORPHINE )   Egg-Derived Products Nausea Only    Stomach pains   Fentanyl  Nausea Only and Other (See Comments)    Tolerates Dilaudid  or morphine  much better   Hydrocodone  Nausea And Vomiting   Lactose Intolerance (  Gi) Nausea And Vomiting and Other (See Comments)    (Also) vertigo   Oxycodone  Nausea And Vomiting    Tolerates hydromorphone  better   Penicillin G Rash   Penicillins Rash    Has patient had a PCN reaction causing immediate rash, facial/tongue/throat swelling, SOB or lightheadedness with hypotension: Yes Has patient had a PCN reaction causing severe rash involving mucus membranes or skin necrosis: No Has patient had a PCN reaction that required hospitalization No Has patient had a PCN reaction occurring within the last 10 years: No If all of the above answers are "NO",  then may proceed with Cephalosporin use.    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 09/09/2016   Generalized headaches    Hernia, inguinal, right    Hypertension    Leg swelling      Past Surgical History:  Procedure Laterality Date   COLON SURGERY  2010   per patient "colon take down"   COLOSTOMY  2010   LAPAROSCOPIC CHOLECYSTECTOMY SINGLE SITE WITH INTRAOPERATIVE CHOLANGIOGRAM N/A 09/09/2016   Procedure: LAPAROSCOPIC CHOLECYSTECTOMY  WITH INTRAOPERATIVE CHOLANGIOGRAM;  Surgeon: Candyce Champagne, MD;  Location: [facility];  Service: General;  Laterality: N/A;   TUBAL LIGATION      Family History  Problem Relation Age of Onset   Heart disease Mother 42   Heart failure Mother    Asthma Mother    Emphysema Mother    Alzheimer's disease Father 46   Emphysema Brother    Asthma Brother    Colon cancer Neg Hx    Rectal cancer Neg Hx    Esophageal cancer Neg Hx    Pancreatic cancer Neg Hx     Social History   Tobacco Use   Smoking status: Every Day    Current packs/day: 1.00    Average packs/day: 1 pack/day for 47.0 years (47.0 ttl pk-yrs)    Types: Cigarettes   Smokeless tobacco: Never   Tobacco comments:    1/2 ppd 10/31/2023  Vaping Use   Vaping status: Never Used  Substance Use Topics   Alcohol use: No    Alcohol/week: 0.0 standard drinks of alcohol   Drug use: No    ROS Refer to HPI for ROS details.  Objective:   Vitals: BP (!) 150/93 (BP Location: Left Arm)   Pulse 82   Temp 98.9 F (37.2 C) (Oral)   Resp 18   SpO2 90% Comment: Patient reports a  historyof COPD and emphysemaa nd states the air is humid and which decreases her sats.  Physical Exam Vitals and nursing note reviewed.  Constitutional:      General: She is not in acute distress.    Appearance: Normal appearance. She is well-developed. She is not ill-appearing or toxic-appearing.  HENT:     Head: Normocephalic and atraumatic.     Nose: Nose normal.     Mouth/Throat:     Mouth: Mucous membranes are moist.  Eyes:     Extraocular Movements: Extraocular movements intact.     Conjunctiva/sclera:  Conjunctivae normal.  Cardiovascular:     Rate and Rhythm: Normal rate.  Pulmonary:     Effort: Pulmonary effort is normal. No respiratory distress.  Musculoskeletal:     Cervical back: Neck supple. Spasms and tenderness present. No swelling, rigidity or bony tenderness. Pain with movement present. Decreased range of motion.     Lumbar back: Tenderness present. No swelling, spasms or bony tenderness. Normal range of motion.  Skin:    General: Skin is warm and dry.  Neurological:     General: No focal deficit present.     Mental Status: She is alert and oriented to person, place, and time.  Psychiatric:        Mood and Affect: Mood normal.        Behavior: Behavior normal.     Procedures  Results for orders placed or performed during the hospital encounter of 11/18/23 (from the past 24 hours)  POCT urinalysis dipstick     Status: Abnormal   Collection Time: 11/18/23  6:15 PM  Result Value Ref Range   Color, UA yellow yellow   Clarity, UA clear clear   Glucose, UA negative negative mg/dL   Bilirubin, UA negative negative   Ketones, POC UA negative negative mg/dL   Spec Grav, UA 4.098 1.191 - 1.025   Blood, UA negative negative   pH, UA 6.0 5.0 - 8.0   Protein Ur, POC trace (A) negative mg/dL   Urobilinogen, UA 0.2 0.2 or 1.0 E.U./dL   Nitrite, UA Negative Negative   Leukocytes, UA Negative Negative  POC CBG monitoring     Status: None   Collection Time: 11/18/23   6:41 PM  Result Value Ref Range   POCT Glucose ([facility]) 96 70 - 99 mg/dL    No results found.   Assessment and Plan :     Discharge Instructions       1. Musculoskeletal pain (Primary) - POCT urinalysis dipstick shows no leukocytes, no nitrite, no blood, no sign of urinary tract infection - POC CBG monitoring in UC shows 96 mg/dL. - diclofenac  (VOLTAREN ) 50 MG EC tablet; Take 1 tablet (50 mg total) by mouth 2 (two) times daily.  Dispense: 30 tablet; Refill: 1 - baclofen (LIORESAL) 10 MG tablet; Take 1 tablet (10 mg total) by mouth 3 (three) times daily.  Dispense: 30 each; Refill: 0 -Continue to monitor symptoms for any change in severity if there is any escalation of current symptoms or development of new symptoms follow-up in ER for further evaluation and management.    [facility] [employer] Arnella Pralle   Roneisha Stern, [place], [place] 11/18/23 [phone]

## 2023-11-18 NOTE — Telephone Encounter (Signed)
    FYI Only or Action Required?: FYI only for provider  Patient was last seen in primary care on 10/24/2023 by Joaquin Mulberry, MD. Called Nurse Triage reporting Urinary Frequency. Symptoms began several days ago. Interventions attempted: Nothing. Symptoms are: gradually worsening.  Triage Disposition: See Physician Within 24 Hours  Patient/caregiver understands and will follow disposition?: Yes             Copied from CRM [id number]. Topic: Appointments - Appointment Scheduling >> Nov 18, 2023  2:02 PM Emylou G wrote: back and sides are hurting, tired.. frequent urine Reason for Disposition  Side (flank) or lower back pain present  Answer Assessment - Initial Assessment Questions 1. SYMPTOM: "What's the main symptom you're concerned about?" (e.g., frequency, incontinence)     Frequency  2. ONSET: "When did the  sx  start?"     2 days ago  3. PAIN: "Is there any pain?" If Yes, ask: "How bad is it?" (Scale: 1-10; mild, moderate, severe)     5/10 4. CAUSE: "What do you think is causing the symptoms?"     UTI 5. OTHER SYMPTOMS: "Do you have any other symptoms?" (e.g., blood in urine, fever, flank pain, pain with urination)     Bilateral back and flank pain, tired  Protocols used: Urinary Symptoms-A-AH

## 2023-11-18 NOTE — ED Triage Notes (Signed)
 Patient reports that she has had left lower back and left flank pain x 2 days. Patient also reports urinary frequency. Patient states she also feels lethargic.  Patient also c/o left lateral neck pain tht started yesterday as well.  Patient states she has taken Tylenol  and the last dose was an hour ago.

## 2023-11-18 NOTE — Discharge Instructions (Addendum)
  1. Musculoskeletal pain (Primary) - POCT urinalysis dipstick shows no leukocytes, no nitrite, no blood, no sign of urinary tract infection - POC CBG monitoring in UC shows 96 mg/dL. - diclofenac  (VOLTAREN ) 50 MG EC tablet; Take 1 tablet (50 mg total) by mouth 2 (two) times daily.  Dispense: 30 tablet; Refill: 1 - baclofen (LIORESAL) 10 MG tablet; Take 1 tablet (10 mg total) by mouth 3 (three) times daily.  Dispense: 30 each; Refill: 0 -Continue to monitor symptoms for any change in severity if there is any escalation of current symptoms or development of new symptoms follow-up in ER for further evaluation and management.

## 2023-12-23 ENCOUNTER — Other Ambulatory Visit: Payer: Self-pay | Admitting: Family Medicine

## 2023-12-23 DIAGNOSIS — J439 Emphysema, unspecified: Secondary | ICD-10-CM

## 2024-01-13 ENCOUNTER — Telehealth: Payer: Self-pay | Admitting: Family Medicine

## 2024-01-13 NOTE — Telephone Encounter (Signed)
 Called patient to confirm appointment on 01/16/2024 at 1:30 am, was unable to reach patient.

## 2024-01-16 ENCOUNTER — Ambulatory Visit: Attending: Family Medicine | Admitting: Family Medicine

## 2024-01-16 ENCOUNTER — Encounter: Payer: Self-pay | Admitting: Family Medicine

## 2024-01-16 DIAGNOSIS — I10 Essential (primary) hypertension: Secondary | ICD-10-CM

## 2024-01-16 DIAGNOSIS — E7849 Other hyperlipidemia: Secondary | ICD-10-CM | POA: Diagnosis not present

## 2024-01-16 DIAGNOSIS — K219 Gastro-esophageal reflux disease without esophagitis: Secondary | ICD-10-CM

## 2024-01-16 DIAGNOSIS — E559 Vitamin D deficiency, unspecified: Secondary | ICD-10-CM

## 2024-01-16 DIAGNOSIS — M5430 Sciatica, unspecified side: Secondary | ICD-10-CM

## 2024-01-16 DIAGNOSIS — N3946 Mixed incontinence: Secondary | ICD-10-CM

## 2024-01-16 MED ORDER — AMLODIPINE BESYLATE 5 MG PO TABS: 5.0000 mg | ORAL_TABLET | Freq: Every day | ORAL | 1 refills | Status: DC

## 2024-01-16 MED ORDER — PANTOPRAZOLE SODIUM 40 MG PO TBEC
40.0000 mg | DELAYED_RELEASE_TABLET | Freq: Every day | ORAL | 1 refills | Status: DC
Start: 2024-01-16 — End: 2024-04-25

## 2024-01-16 MED ORDER — LISINOPRIL 20 MG PO TABS
ORAL_TABLET | ORAL | 1 refills | Status: DC
Start: 2024-01-16 — End: 2024-04-25

## 2024-01-16 MED ORDER — ROSUVASTATIN CALCIUM 5 MG PO TABS: 5.0000 mg | ORAL_TABLET | Freq: Every day | ORAL | 1 refills | Status: DC

## 2024-01-16 MED ORDER — GABAPENTIN 300 MG PO CAPS: 300.0000 mg | ORAL_CAPSULE | Freq: Every day | ORAL | 1 refills | Status: AC

## 2024-01-16 NOTE — Patient Instructions (Signed)
 VISIT SUMMARY:  Today, you came in for documentation needed for insurance coverage of your incontinence supplies. We discussed your ongoing issues with urinary incontinence, COPD, chronic back pain with sciatica, hypertension, hyperlipidemia, GERD, and vitamin D  deficiency. We reviewed your current medications and made some adjustments to better manage your symptoms.  YOUR PLAN:  -URINARY INCONTINENCE: Urinary incontinence means you have difficulty controlling your bladder, especially when you cough, which is related to your COPD. We documented your need for incontinence supplies and will fax the form for medium pull-ups to your insurance.  -CHRONIC OBSTRUCTIVE PULMONARY DISEASE (COPD) WITH EMPHYSEMA: COPD with emphysema is a lung condition that makes it hard to breathe and can worsen your urinary incontinence when you cough. No changes were made to your current treatment plan for COPD.  -CHRONIC THORACIC BACK PAIN WITH SCIATICA: Chronic thoracic back pain with sciatica is ongoing pain in your upper back that radiates down your leg. Since tizanidine  is not effective, we discussed and prescribed gabapentin  300 mg to take at bedtime. You can continue taking tizanidine  as needed.  -HYPERTENSION: Hypertension is high blood pressure. Your condition is managed with lisinopril , which we have refilled for a 90-day supply with one refill.  -HYPERLIPIDEMIA: Hyperlipidemia means you have high levels of fats in your blood. Your condition is managed with rosuvastatin , which we have refilled for a 90-day supply with one refill.  -GASTROESOPHAGEAL REFLUX DISEASE (GERD): GERD is a condition where stomach acid frequently flows back into the tube connecting your mouth and stomach. Your condition is managed with pantoprazole , which we have refilled for a 90-day supply with one refill.  -VITAMIN D  DEFICIENCY: Vitamin D  deficiency means you have low levels of vitamin D , which can cause low energy and fatigue. We  recommend you take an over-the-counter vitamin D  supplement, [phone] IU daily, and we will check your vitamin D  levels at your next office visit.  INSTRUCTIONS:  We will fax the form for medium pull-ups to your insurance. Please start taking gabapentin  300 mg at bedtime for your back pain and continue tizanidine  as needed. Refill your lisinopril , rosuvastatin , and pantoprazole  for a 90-day supply with one refill each. Begin taking an over-the-counter vitamin D  supplement, [phone] IU daily. We will check your vitamin D  levels at your next office visit.

## 2024-01-16 NOTE — Progress Notes (Signed)
 Virtual Visit via Video Note  I connected with Maria Oliver, on 01/16/2024 at 1:43 PM by video enabled telemedicine device and verified that I am speaking with the correct person using two identifiers.   Consent: I discussed the limitations, risks, security and privacy concerns of performing an evaluation and management service by telemedicine and the availability of in person appointments. I also discussed with the patient that there may be a patient responsible charge related to this service. The patient expressed understanding and agreed to proceed.   Location of Patient: Home  Location of Provider: Clinic   Persons participating in Telemedicine visit: MYLDRED RAJU Dr. Delbert    Discussed the use of AI scribe software for clinical note transcription with the patient, who gave verbal consent to proceed.  History of Present Illness Maria Oliver is a 63 year old female with a history of hypertension, nicotine  dependence (since elementary school) COPD, urinary incontinence who presents for insurance documentation for incontinence supplies.  She has experienced urinary incontinence for at least two years, primarily when coughing, which she associates with her COPD. She uses approximately three pull-ups daily and does not use under pads. Her insurance no longer covers wipes.  Her current medications include pantoprazole , lisinopril , rosuvastatin , and tizanidine , (which she finds ineffective for sciatica). She experiences low energy and fatigue, with normal blood count and thyroid  function tests earlier this year. Her vitamin D  level was low in 2022, and she limits dairy intake due to stomach discomfort. She takes Centrum occasionally and uses Boost nutritional drinks daily. She is requesting a refill of her antihypertensive, statin and PPI as she states she is running low on refills even though at her last visit in 65/2025 I had given her 59-month supply with 1 refill which should  last until 04/2024.     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  Interfered with her HCTZ (also)   Codeine Nausea And Vomiting and Other (See Comments)    (Also) GI upset/Hernia (CAN TOLERATE DILAUDID  & MORPHINE )   Egg-Derived Products Nausea Only    Stomach pains   Fentanyl  Nausea Only and Other (See Comments)    Tolerates Dilaudid  or morphine  much better   Hydrocodone  Nausea And Vomiting   Lactose Intolerance (Gi) Nausea And Vomiting and Other (See Comments)    (Also) vertigo   Oxycodone  Nausea And Vomiting    Tolerates hydromorphone  better   Penicillin G Rash   Penicillins Rash    Has patient had a PCN reaction causing immediate rash, facial/tongue/throat swelling, SOB or lightheadedness with hypotension: Yes Has patient had a PCN reaction causing severe rash involving mucus membranes or skin necrosis: No Has patient had a PCN reaction that required hospitalization No Has patient had a PCN  reaction occurring within the last 10 years: No If all of the above answers are NO, then may proceed with Cephalosporin use.    Tape Rash and Other (See Comments)    Please try paper tape    Current Outpatient Medications on File Prior to Visit  Medication Sig Dispense Refill   acetaminophen  (TYLENOL ) 500 MG tablet Take 500-1,000 mg by mouth daily as  needed for headache, mild pain or fever.     albuterol  (VENTOLIN  HFA) 108 (90 Base) MCG/ACT inhaler INHALE 2 PUFFS INTO THE LUNGS EVERY 6 HOURS AS NEEDED FOR WHEEZING OR SHORTNESS OF BREATH 25.5 g 1   amLODipine  (NORVASC ) 5 MG tablet Take 1 tablet (5 mg total) by mouth daily. 90 tablet 1   baclofen  (LIORESAL ) 10 MG tablet Take 1 tablet (10 mg total) by mouth 3 (three) times daily. 30 each 0   cimetidine (TAGAMET) 200 MG tablet Take 200 mg by mouth 2 (two) times daily as needed (stomach pain).     clindamycin  (CLEOCIN ) 300 MG capsule Take 300 mg by mouth every 6 (six) hours. (Patient not taking: Reported on 10/31/2023)     diclofenac  (VOLTAREN ) 50 MG EC tablet Take 1 tablet (50 mg total) by mouth 2 (two) times daily. 30 tablet 1   fluticasone  (FLONASE ) 50 MCG/ACT nasal spray Place 1 spray into both nostrils daily. 18.2 mL 2   Fluticasone -Umeclidin-Vilant (TRELEGY ELLIPTA ) 100-62.5-25 MCG/ACT AEPB Inhale 1 puff into the lungs daily. 60 each 5   lisinopril  (ZESTRIL ) 20 MG tablet TAKE 1 TABLET(20 MG) BY MOUTH DAILY 90 tablet 1   Misc. Devices MISC Pull ups - Medium.  Wipes Diagnosis- incontinence 1 Box 0   Multiple Vitamin (MULTIVITAMIN WITH MINERALS) TABS tablet Take 1 tablet by mouth daily.     pantoprazole  (PROTONIX ) 40 MG tablet Take 1 tablet (40 mg total) by mouth daily. 90 tablet 1   polyethylene glycol powder (MIRALAX ) 17 GM/SCOOP powder Take 17 g by mouth daily. 510 g 0   rosuvastatin  (CRESTOR ) 5 MG tablet Take 1 tablet (5 mg total) by mouth daily. 90 tablet 1   tiZANidine  (ZANAFLEX ) 4 MG tablet Take 1 tablet (4 mg total) by mouth every 8 (eight) hours as needed. 60 tablet 1   No current facility-administered medications on file prior to visit.    ROS: See HPI  Observations/Objective: Awake, alert, oriented x3 Not in acute distress Normal mood      Latest Ref Rng & Units 10/25/2023    9:00 AM 03/21/2023   12:23 PM 01/25/2023    3:19 PM  CMP  Glucose 70 - 99 mg/dL 99  92  898    BUN 8 - 27 mg/dL 7  <5  10   Creatinine 0.57 - 1.00 mg/dL 9.35  9.31  9.16   Sodium 134 - 144 mmol/L 141  139  141   Potassium 3.5 - 5.2 mmol/L 4.1  3.5  4.2   Chloride 96 - 106 mmol/L 103  103  102   CO2 20 - 29 mmol/L 24  26  32   Calcium  8.7 - 10.3 mg/dL 9.2  9.1  9.4   Total Protein 6.0 - 8.5 g/dL 6.5  7.0  6.9   Total Bilirubin 0.0 - 1.2 mg/dL <9.7  0.2  0.3   Alkaline Phos 44 - 121 IU/L 82  77  72   AST 0 - 40 IU/L 16  17  15    ALT 0 - 32 IU/L 13  18  14     Lipid Panel     Component Value Date/Time   CHOL 161 10/25/2023 0900   TRIG 81 10/25/2023 0900   HDL 56 10/25/2023 0900   CHOLHDL 3.5 06/28/2017 1157   CHOLHDL 4.7 05/09/2013 1445   VLDL 32 05/09/2013 1445   LDLCALC 90 10/25/2023 0900   LABVLDL 15 10/25/2023 0900    Lab Results  Component Value Date   HGBA1C 5.8 (H) 10/25/2023    Lab Results  Component Value Date   TSH 1.703 07/26/2022    Assessment and plan:   Assessment & Plan Urinary incontinence Urinary incontinence present for two years. - Document need for incontinence supplies for insurance. - Fax form for medium pull-ups to insurance.  Chronic Obstructive Pulmonary Disease (COPD) with emphysema COPD with emphysema exacerbates urinary incontinence during coughing.  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 Tizanidine  ineffective. Discussed gabapentin  as adjunctive treatment - Prescribe gabapentin  300 mg at bedtime. - Continue tizanidine  as needed.  Hypertension Hypertension managed with lisinopril . - Refill lisinopril  with 90-day supply and one refill.  Hyperlipidemia Hyperlipidemia managed with rosuvastatin . - Refill rosuvastatin  with 90-day supply and one refill.  Gastroesophageal reflux disease (GERD) GERD managed with pantoprazole , essential for symptom control. - Refill pantoprazole  with 90-day supply and one refill.  Vitamin D  deficiency Vitamin D  deficiency with low levels in 2022.  Reports low energy and fatigue, possibly related. - Recommend over-the-counter vitamin D  supplement, [phone] IU daily. - Plan to check vitamin D  levels at next office visit.     No orders of the defined types were placed in this encounter.   Follow Up Instructions: Previously scheduled follow-up visit for chronic disease management in 04/2024   I discussed the assessment and treatment plan with the patient. The patient was provided an opportunity to ask questions and all were answered. The patient agreed with the plan and demonstrated an understanding of the instructions.   The patient was advised to call back or seek an in-person evaluation if the symptoms worsen or if the condition fails to improve as anticipated.     I provided 16 minutes total of Telehealth time during this encounter including median intraservice time, reviewing previous notes, investigations, ordering medications, medical decision making, coordinating care and patient verbalized understanding at the end of the visit.     Corrina Sabin, MD, FAAFP. [facility] [place], [place] [phone]   01/16/2024, 1:43 PM

## 2024-01-20 ENCOUNTER — Ambulatory Visit: Admitting: Nurse Practitioner

## 2024-03-21 ENCOUNTER — Encounter

## 2024-03-21 ENCOUNTER — Ambulatory Visit: Admitting: Nurse Practitioner

## 2024-04-25 ENCOUNTER — Ambulatory Visit: Attending: Family Medicine | Admitting: Family Medicine

## 2024-04-25 ENCOUNTER — Encounter: Payer: Self-pay | Admitting: Family Medicine

## 2024-04-25 VITALS — BP 129/78 | HR 80 | Temp 98.1°F | Ht 64.0 in | Wt 147.4 lb

## 2024-04-25 DIAGNOSIS — I10 Essential (primary) hypertension: Secondary | ICD-10-CM | POA: Diagnosis not present

## 2024-04-25 DIAGNOSIS — K219 Gastro-esophageal reflux disease without esophagitis: Secondary | ICD-10-CM | POA: Diagnosis not present

## 2024-04-25 DIAGNOSIS — M549 Dorsalgia, unspecified: Secondary | ICD-10-CM

## 2024-04-25 DIAGNOSIS — J439 Emphysema, unspecified: Secondary | ICD-10-CM

## 2024-04-25 DIAGNOSIS — E7849 Other hyperlipidemia: Secondary | ICD-10-CM

## 2024-04-25 LAB — POCT URINALYSIS DIP (CLINITEK)
Bilirubin, UA: NEGATIVE
Blood, UA: NEGATIVE
Glucose, UA: NEGATIVE mg/dL
Ketones, POC UA: NEGATIVE mg/dL
Leukocytes, UA: NEGATIVE
Nitrite, UA: NEGATIVE
POC PROTEIN,UA: NEGATIVE
Spec Grav, UA: 1.01 (ref 1.010–1.025)
Urobilinogen, UA: 0.2 U/dL
pH, UA: 6 (ref 5.0–8.0)

## 2024-04-25 MED ORDER — ALBUTEROL SULFATE HFA 108 (90 BASE) MCG/ACT IN AERS: 2.0000 | INHALATION_SPRAY | Freq: Four times a day (QID) | RESPIRATORY_TRACT | 1 refills | Status: AC | PRN

## 2024-04-25 MED ORDER — LISINOPRIL 20 MG PO TABS
ORAL_TABLET | ORAL | 1 refills | Status: AC
Start: 2024-04-25 — End: ?

## 2024-04-25 MED ORDER — PANTOPRAZOLE SODIUM 40 MG PO TBEC: 40.0000 mg | DELAYED_RELEASE_TABLET | Freq: Every day | ORAL | 1 refills | Status: AC

## 2024-04-25 MED ORDER — AMLODIPINE BESYLATE 5 MG PO TABS: 5.0000 mg | ORAL_TABLET | Freq: Every day | ORAL | 1 refills | Status: AC

## 2024-04-25 MED ORDER — ROSUVASTATIN CALCIUM 5 MG PO TABS: 5.0000 mg | ORAL_TABLET | Freq: Every day | ORAL | 1 refills | Status: AC

## 2024-04-25 NOTE — Patient Instructions (Signed)
 VISIT SUMMARY:  Today, we discussed your ongoing back pain, possible urinary tract infection symptoms, and reviewed your current medications for hypertension, acid reflux, and COPD. We also talked about potential treatments and next steps for your back pain.  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 We have ordered an x-ray of your lumbar spine to investigate further. Continue taking tizanidine  as needed for pain relief. We also discussed the option of physical therapy and possibly seeing an orthopedic specialist for joint injections if physical therapy does not help. Additionally, a TENS unit was recommended for managing your pain.  -ESSENTIAL HYPERTENSION: Your high blood pressure is being managed with your current medications, amlodipine  and lisinopril . Please continue taking these medications as prescribed.  -GASTROESOPHAGEAL REFLUX DISEASE (GERD): Your acid reflux is being managed with pantoprazole . We have refilled your prescription for this medication.  -CHRONIC OBSTRUCTIVE PULMONARY DISEASE (COPD) WITH EMPHYSEMA: Your COPD with emphysema is being managed with your Ventolin  inhaler, which you use as needed. We have refilled your prescription for the inhaler.  INSTRUCTIONS:  Please follow up after your lumbar spine x-ray to discuss the results and next steps. If your back pain persists or worsens, consider starting physical therapy or scheduling an appointment with an orthopedic specialist. Continue taking your medications as prescribed and use the TENS unit as recommended for pain management.

## 2024-04-25 NOTE — Progress Notes (Signed)
 Subjective:  Patient ID: Maria Oliver, female    DOB: 1960/06/23  Age: 63 y.o. MRN: [id number]  CC: Medical Management of Chronic Issues (Lower back pain/UTI symptoms)     Discussed the use of AI scribe software for clinical note transcription with the patient, who gave verbal consent to proceed.  History of Present Illness Maria Oliver is a 63 year old female with  a history of hypertension, nicotine  dependence (since elementary school) COPD who presents with back pain which she attributes to possible UTI symptoms.  She experiences persistent back pain since the summer, primarily on the left side, sometimes spreading to the midline and upwards. The pain was severe over the weekend, affecting her ability to participate in activities. It occasionally radiates to her leg, associated with her sciatica. Gabapentin  and tizanidine  provide relief, though gabapentin  is not taken daily due to side effects.  Urinary symptoms include bubbles, cloudiness, and dark urine upon waking, without frequency, burning, or hematuria. She is concerned about a possible urinary tract infection.  Current medications include amlodipine  and lisinopril  for hypertension, pantoprazole  for acid reflux, and a Ventolin  inhaler. She recently requested refills for pantoprazole  and Ventolin .     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 09/09/2016   Generalized headaches    Hernia, inguinal, right    Hypertension    Leg swelling     Past Surgical History:  Procedure Laterality Date   COLON SURGERY  2010   per patient colon take down   COLOSTOMY  2010   LAPAROSCOPIC  CHOLECYSTECTOMY SINGLE SITE WITH INTRAOPERATIVE CHOLANGIOGRAM N/A 09/09/2016   Procedure: LAPAROSCOPIC CHOLECYSTECTOMY  WITH INTRAOPERATIVE CHOLANGIOGRAM;  Surgeon: Elspeth Schultze, MD;  Location: [facility];  Service: General;  Laterality: N/A;   TUBAL LIGATION      Family History  Problem Relation Age of Onset   Heart disease Mother 72   Heart failure Mother    Asthma Mother    Emphysema Mother    Alzheimer's disease Father 39   Emphysema Brother    Asthma Brother    Colon cancer Neg Hx    Rectal cancer Neg Hx    Esophageal cancer Neg Hx    Pancreatic cancer Neg Hx     Social History   Socioeconomic History   Marital status: Widowed    Spouse name: Not on file   Number of children: 3   Years of education: Not on file   Highest education level: Not on file  Occupational History   Occupation: disabled  Tobacco Use   Smoking status: Every Day    Current packs/day: 1.00    Average packs/day: 1 pack/day for 47.0 years (47.0 ttl pk-yrs)    Types: Cigarettes   Smokeless tobacco: Never   Tobacco comments:    1/2 ppd 10/31/2023  Vaping Use   Vaping status: Never Used  Substance and Sexual Activity   Alcohol use: No    Alcohol/week: 0.0 standard drinks of alcohol   Drug use: No   Sexual activity: Not Currently    Birth control/protection: Surgical, None  Other Topics Concern   Not  on file  Social History Narrative   Not on file   Social Drivers of Health   Financial Resource Strain: Low Risk  (10/25/2023)   Overall Financial Resource Strain (CARDIA)    Difficulty of Paying Living Expenses: Not hard at all  Food Insecurity: No Food Insecurity (10/25/2023)   Hunger Vital Sign    Worried About Running Out of Food in the Last Year: Never true    Ran Out of Food in the Last Year: Never true  Transportation Needs: No Transportation Needs (10/25/2023)   PRAPARE - Administrator, Civil Service (Medical): No    Lack of Transportation (Non-Medical): No  Physical Activity:  Insufficiently Active (10/25/2023)   Exercise Vital Sign    Days of Exercise per Week: 3 days    Minutes of Exercise per Session: 30 min  Stress: No Stress Concern Present (10/25/2023)   [employer] of Occupational Health - Occupational Stress Questionnaire    Feeling of Stress : Not at all  Social Connections: Socially Isolated (10/25/2023)   Social Connection and Isolation Panel    Frequency of Communication with Friends and Family: More than three times a week    Frequency of Social Gatherings with Friends and Family: More than three times a week    Attends Religious Services: Never    Database Administrator or Organizations: No    Attends Banker Meetings: Never    Marital Status: Widowed    Allergies  Allergen Reactions   Aspirin Nausea Only and Other (See Comments)    GI upset/Hernia     Ciprofloxacin  Nausea Only and Other (See Comments)    Tendonitis, joint pain, and nausea.  Interfered with her HCTZ (also)   Codeine Nausea And Vomiting and Other (See Comments)    (Also) GI upset/Hernia (CAN TOLERATE DILAUDID  & MORPHINE )   Egg Protein-Containing Drug Products Nausea Only    Stomach pains   Fentanyl  Nausea Only and Other (See Comments)    Tolerates Dilaudid  or morphine  much better   Hydrocodone  Nausea And Vomiting   Lactose Intolerance (Gi) Nausea And Vomiting and Other (See Comments)    (Also) vertigo   Oxycodone  Nausea And Vomiting    Tolerates hydromorphone  better   Penicillin G Rash   Penicillins Rash    Has patient had a PCN reaction causing immediate rash, facial/tongue/throat swelling, SOB or lightheadedness with hypotension: Yes Has patient had a PCN reaction causing severe rash involving mucus membranes or skin necrosis: No Has patient had a PCN reaction that required hospitalization No Has patient had a PCN reaction occurring within the last 10 years: No If all of the above answers are NO, then may proceed with Cephalosporin use.    Tape  Rash and Other (See Comments)    Please try paper tape    Outpatient Medications Prior to Visit  Medication Sig Dispense Refill   acetaminophen  (TYLENOL ) 500 MG tablet Take 500-1,000 mg by mouth daily as needed for headache, mild pain or fever.     cimetidine (TAGAMET) 200 MG tablet Take 200 mg by mouth 2 (two) times daily as needed (stomach pain).     fluticasone  (FLONASE ) 50 MCG/ACT nasal spray Place 1 spray into both nostrils daily. 18.2 mL 2   Fluticasone -Umeclidin-Vilant (TRELEGY ELLIPTA ) 100-62.5-25 MCG/ACT AEPB Inhale 1 puff into the lungs daily. 60 each 5   gabapentin  (NEURONTIN ) 300 MG capsule Take 1 capsule (300 mg total) by mouth at bedtime. 90 capsule 1   Misc.  Devices MISC Pull ups - Medium.  Wipes Diagnosis- incontinence 1 Box 0   Multiple Vitamin (MULTIVITAMIN WITH MINERALS) TABS tablet Take 1 tablet by mouth daily.     polyethylene glycol powder (MIRALAX ) 17 GM/SCOOP powder Take 17 g by mouth daily. 510 g 0   tiZANidine  (ZANAFLEX ) 4 MG tablet Take 1 tablet (4 mg total) by mouth every 8 (eight) hours as needed. 60 tablet 1   albuterol  (VENTOLIN  HFA) 108 (90 Base) MCG/ACT inhaler INHALE 2 PUFFS INTO THE LUNGS EVERY 6 HOURS AS NEEDED FOR WHEEZING OR SHORTNESS OF BREATH 25.5 g 1   amLODipine  (NORVASC ) 5 MG tablet Take 1 tablet (5 mg total) by mouth daily. 90 tablet 1   lisinopril  (ZESTRIL ) 20 MG tablet TAKE 1 TABLET(20 MG) BY MOUTH DAILY 90 tablet 1   pantoprazole  (PROTONIX ) 40 MG tablet Take 1 tablet (40 mg total) by mouth daily. 90 tablet 1   rosuvastatin  (CRESTOR ) 5 MG tablet Take 1 tablet (5 mg total) by mouth daily. 90 tablet 1   baclofen  (LIORESAL ) 10 MG tablet Take 1 tablet (10 mg total) by mouth 3 (three) times daily. (Patient not taking: Reported on 04/25/2024) 30 each 0   clindamycin  (CLEOCIN ) 300 MG capsule Take 300 mg by mouth every 6 (six) hours. (Patient not taking: Reported on 04/25/2024)     diclofenac  (VOLTAREN ) 50 MG EC tablet Take 1 tablet (50 mg total) by mouth  2 (two) times daily. (Patient not taking: Reported on 04/25/2024) 30 tablet 1   No facility-administered medications prior to visit.     ROS Review of Systems  Constitutional:  Negative for activity change and appetite change.  HENT:  Negative for sinus pressure and sore throat.   Respiratory:  Negative for chest tightness, shortness of breath and wheezing.   Cardiovascular:  Negative for chest pain and palpitations.  Gastrointestinal:  Negative for abdominal distention, abdominal pain and constipation.  Genitourinary:  Positive for flank pain. Negative for dysuria, frequency and hematuria.  Musculoskeletal:  Positive for back pain.  Psychiatric/Behavioral:  Negative for behavioral problems and dysphoric mood.     Objective:  BP 129/78   Pulse 80   Temp 98.1 F (36.7 C) (Oral)   Ht 5' 4 (1.626 m)   Wt 147 lb 6.4 oz (66.9 kg)   SpO2 96%   BMI 25.30 kg/m      04/25/2024    2:58 PM 11/18/2023    5:56 PM 10/31/2023    3:11 PM  BP/Weight  Systolic BP 129 150 130  Diastolic BP 78 93 80  Wt. (Lbs) 147.4  147.2  BMI 25.3 kg/m2  25.27 kg/m2      Physical Exam Constitutional:      Appearance: She is well-developed.  Cardiovascular:     Rate and Rhythm: Normal rate.     Heart sounds: Normal heart sounds. No murmur heard. Pulmonary:     Effort: Pulmonary effort is normal.     Breath sounds: Normal breath sounds. No wheezing or rales.  Chest:     Chest wall: No tenderness.  Abdominal:     General: Bowel sounds are normal. There is no distension.     Palpations: Abdomen is soft. There is no mass.     Tenderness: There is no abdominal tenderness. There is no right CVA tenderness or left CVA tenderness.     Hernia: A hernia is present.  Musculoskeletal:        General: Normal range of motion.  Right lower leg: No edema.     Left lower leg: No edema.     Comments: TTP of left sacroiliac joint; right is normal Negative straight leg bilaterally  Neurological:      Mental Status: She is alert and oriented to person, place, and time.  Psychiatric:        Mood and Affect: Mood normal.        Latest Ref Rng & Units 10/25/2023    9:00 AM 03/21/2023   12:23 PM 01/25/2023    3:19 PM  CMP  Glucose 70 - 99 mg/dL 99  92  898   BUN 8 - 27 mg/dL 7  <5  10   Creatinine 0.57 - 1.00 mg/dL 9.35  9.31  9.16   Sodium 134 - 144 mmol/L 141  139  141   Potassium 3.5 - 5.2 mmol/L 4.1  3.5  4.2   Chloride 96 - 106 mmol/L 103  103  102   CO2 20 - 29 mmol/L 24  26  32   Calcium  8.7 - 10.3 mg/dL 9.2  9.1  9.4   Total Protein 6.0 - 8.5 g/dL 6.5  7.0  6.9   Total Bilirubin 0.0 - 1.2 mg/dL <9.7  0.2  0.3   Alkaline Phos 44 - 121 IU/L 82  77  72   AST 0 - 40 IU/L 16  17  15    ALT 0 - 32 IU/L 13  18  14      Lipid Panel     Component Value Date/Time   CHOL 161 10/25/2023 0900   TRIG 81 10/25/2023 0900   HDL 56 10/25/2023 0900   CHOLHDL 3.5 06/28/2017 1157   CHOLHDL 4.7 05/09/2013 1445   VLDL 32 05/09/2013 1445   LDLCALC 90 10/25/2023 0900    CBC    Component Value Date/Time   WBC 9.1 10/25/2023 0900   WBC 15.7 (H) 03/21/2023 1223   RBC 5.24 10/25/2023 0900   RBC 5.03 03/21/2023 1223   HGB 14.8 10/25/2023 0900   HCT 46.0 10/25/2023 0900   PLT 328 10/25/2023 0900   MCV 88 10/25/2023 0900   MCH 28.2 10/25/2023 0900   MCH 27.8 03/21/2023 1223   MCHC 32.2 10/25/2023 0900   MCHC 31.6 03/21/2023 1223   RDW 13.9 10/25/2023 0900   LYMPHSABS 3.1 10/25/2023 0900   MONOABS 0.7 07/27/2022 0411   EOSABS 0.5 (H) 10/25/2023 0900   BASOSABS 0.1 10/25/2023 0900    Lab Results  Component Value Date   HGBA1C 5.8 (H) 10/25/2023       Assessment & Plan Musculoskeletal back pain Chronic back pain with sciatica, primarily left flank, radiating to midline and occasionally down leg. Negative UTI test suggests musculoskeletal origin. Pain relieved by tizanidine  and gabapentin . Differential includes musculoskeletal pain versus arthritis. - Ordered lumbar spine  x-ray. - Continue tizanidine  as needed. - Discussed physical therapy as treatment option. - Discussed referral to orthopedics for joint injections if physical therapy ineffective. - Recommended TENS unit for pain management.   Essential hypertension Hypertension managed with amlodipine  and lisinopril . - Continue amlodipine  and lisinopril . -Counseled on blood pressure goal of less than 130/80, low-sodium, DASH diet, medication compliance, 150 minutes of moderate intensity exercise per week. Discussed medication compliance, adverse effects.   Gastroesophageal reflux disease (GERD) GERD managed with pantoprazole . - Refilled pantoprazole  prescription. -Advised to avoid recumbency up to 2 hours postmeal, avoid late meals, avoid foods that trigger symptoms.   Emphysema COPD with emphysema managed with  Ventolin  inhaler. Reports using inhaler as needed. - Refilled Ventolin  inhaler.   Other hyperlipidemia - Controlled - Continue statin - Low-cholesterol diet    Meds ordered this encounter  Medications   amLODipine  (NORVASC ) 5 MG tablet    Sig: Take 1 tablet (5 mg total) by mouth daily.    Dispense:  90 tablet    Refill:  1   lisinopril  (ZESTRIL ) 20 MG tablet    Sig: TAKE 1 TABLET(20 MG) BY MOUTH DAILY    Dispense:  90 tablet    Refill:  1   pantoprazole  (PROTONIX ) 40 MG tablet    Sig: Take 1 tablet (40 mg total) by mouth daily.    Dispense:  90 tablet    Refill:  1   rosuvastatin  (CRESTOR ) 5 MG tablet    Sig: Take 1 tablet (5 mg total) by mouth daily.    Dispense:  90 tablet    Refill:  1   albuterol  (VENTOLIN  HFA) 108 (90 Base) MCG/ACT inhaler    Sig: Inhale 2 puffs into the lungs every 6 (six) hours as needed for wheezing or shortness of breath.    Dispense:  25.5 g    Refill:  1    Follow-up: Return in about 6 months (around 10/23/2024) for Chronic medical conditions.       Corrina Sabin, MD, FAAFP. [facility] [place],  [place] [phone]   04/25/2024, 4:44 PM

## 2024-04-26 ENCOUNTER — Ambulatory Visit: Payer: Self-pay | Admitting: Family Medicine

## 2024-04-26 ENCOUNTER — Ambulatory Visit
Admission: RE | Admit: 2024-04-26 | Discharge: 2024-04-26 | Disposition: A | Discharge status: Home / Self Care | Source: Ambulatory Visit | Attending: Family Medicine | Admitting: Family Medicine

## 2024-04-26 DIAGNOSIS — M549 Dorsalgia, unspecified: Secondary | ICD-10-CM

## 2024-04-26 DIAGNOSIS — M8580 Other specified disorders of bone density and structure, unspecified site: Secondary | ICD-10-CM

## 2024-04-26 LAB — BASIC METABOLIC PANEL WITH GFR
BUN/Creatinine Ratio: 11 — ABNORMAL LOW (ref 12–28)
BUN: 7 mg/dL — ABNORMAL LOW (ref 8–27)
CO2: 27 mmol/L (ref 20–29)
Calcium: 10.2 mg/dL (ref 8.7–10.3)
Chloride: 102 mmol/L (ref 96–106)
Creatinine, Ser: 0.61 mg/dL (ref 0.57–1.00)
Glucose: 71 mg/dL (ref 70–99)
Potassium: 4.5 mmol/L (ref 3.5–5.2)
Sodium: 143 mmol/L (ref 134–144)
eGFR: 99 mL/min/1.73 (ref >59)

## 2024-06-20 ENCOUNTER — Telehealth: Payer: Self-pay | Admitting: Family Medicine

## 2024-06-20 NOTE — Telephone Encounter (Signed)
 Copied from CRM #[id number]. Topic: Clinical - Medical Advice >> Jun 20, 2024  2:21 PM Winona R wrote:  Pt states her grandchildren has a cat that gave everyone ringworm and would like to know if she can have something for it. Pt experiencing what she believes is ringworm on her face and scalp

## 2024-06-21 NOTE — Telephone Encounter (Signed)
Does patient need a virtual visit to discuss?

## 2024-06-21 NOTE — Telephone Encounter (Signed)
 Patient was called and given virtual appointment to discuss concerns.

## 2024-06-21 NOTE — Telephone Encounter (Signed)
 Yes please

## 2024-06-25 ENCOUNTER — Encounter: Payer: Self-pay | Admitting: Family Medicine

## 2024-06-25 ENCOUNTER — Ambulatory Visit: Admitting: Family Medicine

## 2024-06-25 DIAGNOSIS — L680 Hirsutism: Secondary | ICD-10-CM | POA: Diagnosis not present

## 2024-06-25 DIAGNOSIS — L309 Dermatitis, unspecified: Secondary | ICD-10-CM | POA: Diagnosis not present

## 2024-06-25 DIAGNOSIS — L219 Seborrheic dermatitis, unspecified: Secondary | ICD-10-CM

## 2024-06-25 MED ORDER — SELENIUM SULFIDE 2.25 % EX SHAM: MEDICATED_SHAMPOO | CUTANEOUS | 0 refills | Status: AC

## 2024-06-25 MED ORDER — TRIAMCINOLONE ACETONIDE 0.1 % EX CREA: 1.0000 | TOPICAL_CREAM | Freq: Two times a day (BID) | CUTANEOUS | 0 refills | Status: AC

## 2024-06-25 NOTE — Patient Instructions (Signed)
 VISIT SUMMARY:  Today you were seen for concerns about possible ringworm and scalp bumps. You have had an itchy area on your neck for the past two weeks and persistent bumps on your scalp for the past two years. You also mentioned having sensitive facial skin and new facial hair causing discomfort.  YOUR PLAN:  -DERMATITIS OF THE NECK: Dermatitis is a condition that causes red, itchy, and inflamed skin. You have been prescribed triamcinolone  cream to apply to the affected area on your neck to help reduce the inflammation and itching.  -SCALP DERMATITIS (POSSIBLE SEBORRHEIC DERMATITIS): Seborrheic dermatitis is a common skin condition that causes scaly patches, red skin, and stubborn dandruff. You have been prescribed selenium  sulfide shampoo to use twice a week to help manage the symptoms. Additionally, you have been referred to [facility] for further evaluation.  INSTRUCTIONS:  Please follow up with [facility] as recommended for further evaluation of your scalp condition.

## 2024-06-25 NOTE — Progress Notes (Signed)
 "  Virtual Visit via Video Note  I connected with Maria Oliver, on 06/25/2024 at 8:24 AM by video enabled telemedicine device and verified that I am speaking with the correct person using two identifiers.   Consent: I discussed the limitations, risks, security and privacy concerns of performing an evaluation and management service by telemedicine and the availability of in person appointments. I also discussed with the patient that there may be a patient responsible charge related to this service. The patient expressed understanding and agreed to proceed.   Location of Patient: Home  Location of Provider: Clinic   Persons participating in Telemedicine visit: Maria Oliver Dr. Delbert    Discussed the use of AI scribe software for clinical note transcription with the patient, who gave verbal consent to proceed.  History of Present Illness Maria Oliver is a 64 year old female with  a history of hypertension, nicotine  dependence (since elementary school) COPD who presents with concerns of possible ringworm and scalp bumps.  For the past two weeks she has had an itchy area on her neck that her son described as red bumps. She stopped using hairspray and changed shampoos without clear improvement. She is worried about ringworm because her grandchildren who live with her have had ringworm and they recently got a new kitten.  She has had a persistent bump on the top of her head for years that has not changed despite attempts to press on it. Over the past two years she has noticed additional small scalp bumps with mild burning and itching.  She reports very sensitive facial skin, with new facial hair causing burning and stinging until she removes it. She is unsure if this is related to her scalp symptoms.  She was referred to dermatology for hirsutism few years back but was unsatisfied with her dermatology visit.      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  No appreciable tinea lesion noted      Latest Ref Rng & Units 04/25/2024    3:50 PM 10/25/2023    9:00 AM 03/21/2023   12:23 PM  CMP  Glucose 70 - 99 mg/dL 71  99  92   BUN 8 - 27 mg/dL 7  7  <5   Creatinine 9.42 - 1.00 mg/dL 9.38  9.35  9.31   Sodium 134 - 144 mmol/L 143  141  139   Potassium 3.5 - 5.2 mmol/L 4.5  4.1  3.5   Chloride 96 - 106 mmol/L 102  103  103   CO2 20 - 29 mmol/L 27  24  26    Calcium  8.7 - 10.3 mg/dL 89.7  9.2  9.1   Total Protein 6.0 - 8.5 g/dL  6.5  7.0   Total Bilirubin 0.0 - 1.2 mg/dL  <  0.2  0.2   Alkaline Phos 44 - 121 IU/L  82  77   AST 0 - 40 IU/L  16  17   ALT 0 - 32 IU/L  13  18     Lipid Panel     Component Value Date/Time   CHOL 161 10/25/2023 0900   TRIG 81 10/25/2023 0900   HDL 56 10/25/2023 0900   CHOLHDL 3.5 06/28/2017 1157   CHOLHDL 4.7 05/09/2013 1445   VLDL 32 05/09/2013 1445   LDLCALC 90 10/25/2023 0900   LABVLDL 15 10/25/2023 0900    Lab Results  Component Value Date   HGBA1C 5.8 (H) 10/25/2023     Assessment and plan:  Assessment & Plan Dermatitis of the neck Reddish patch with itching for two weeks. Differential includes dermatitis. - Prescribed triamcinolone  cream for application on the neck.  Scalp dermatitis (possible seborrheic dermatitis) Bumps with burning and itching for two years.  Possible seborrheic dermatitis. - Prescribed selenium  sulfide shampoo for use twice a week.  Hirsutism/ingrown facial hair - Referred to [facility] for further evaluation.      Meds ordered this encounter  Medications   Selenium  Sulfide 2.25 % SHAM    Sig: Apply twice a week    Dispense:  180 mL    Refill:  0   triamcinolone  cream (KENALOG ) 0.1 %    Sig: Apply 1 Application topically 2 (two) times daily.    Dispense:  30 g    Refill:  0    Follow Up Instructions: Keep previously scheduled appointment   I discussed the assessment and treatment plan with the patient. The patient was provided an opportunity to ask questions and all were answered. The patient agreed with the plan and demonstrated an understanding of the instructions.   The patient was advised to call back or seek an in-person evaluation if the symptoms worsen or if the condition fails to improve as anticipated.     I provided 13 minutes total of Telehealth time during this encounter including median intraservice time, reviewing previous notes, investigations, ordering medications, medical decision making, coordinating care and patient verbalized understanding at the end of the visit.     Corrina Sabin, MD, FAAFP. [facility] [place], [place] [phone]   06/25/2024, 8:24 AM     [1]  Allergies Allergen Reactions   Aspirin Nausea Only and Other (See Comments)    GI upset/Hernia     Ciprofloxacin  Nausea Only and Other (See Comments)    Tendonitis, joint pain, and nausea.  Interfered with her HCTZ (also)   Codeine Nausea And Vomiting and Other (See Comments)    (Also) GI upset/Hernia (CAN TOLERATE DILAUDID  & MORPHINE )   Egg Protein-Containing Drug Products Nausea Only    Stomach pains   Fentanyl  Nausea Only and Other (See Comments)    Tolerates Dilaudid  or morphine  much better   Hydrocodone  Nausea And Vomiting   Lactose Intolerance (Gi) Nausea And  Vomiting and Other (See Comments)    (Also) vertigo   Oxycodone  Nausea And Vomiting    Tolerates hydromorphone  better   Penicillin G Rash   Penicillins Rash    Has patient had a PCN reaction causing immediate rash, facial/tongue/throat swelling, SOB or lightheadedness with hypotension: Yes Has patient had a PCN reaction causing severe rash involving mucus membranes or skin necrosis: No Has patient had a PCN reaction that required hospitalization No Has patient had a PCN reaction occurring within the last 10 years:  No If all of the above answers are NO, then may proceed with Cephalosporin use.    Tape Rash and Other (See Comments)    Please try paper tape  [2]  Current Outpatient Medications on File Prior to Visit  Medication Sig Dispense Refill   acetaminophen  (TYLENOL ) 500 MG tablet Take 500-1,000 mg by mouth daily as needed for headache, mild pain or fever.     albuterol  (VENTOLIN  HFA) 108 (90 Base) MCG/ACT inhaler Inhale 2 puffs into the lungs every 6 (six) hours as needed for wheezing or shortness of breath. 25.5 g 1   amLODipine  (NORVASC ) 5 MG tablet Take 1 tablet (5 mg total) by mouth daily. 90 tablet 1   cimetidine (TAGAMET) 200 MG tablet Take 200 mg by mouth 2 (two) times daily as needed (stomach pain).     fluticasone  (FLONASE ) 50 MCG/ACT nasal spray Place 1 spray into both nostrils daily. 18.2 mL 2   Fluticasone -Umeclidin-Vilant (TRELEGY ELLIPTA ) 100-62.5-25 MCG/ACT AEPB Inhale 1 puff into the lungs daily. 60 each 5   gabapentin  (NEURONTIN ) 300 MG capsule Take 1 capsule (300 mg total) by mouth at bedtime. 90 capsule 1   lisinopril  (ZESTRIL ) 20 MG tablet TAKE 1 TABLET(20 MG) BY MOUTH DAILY 90 tablet 1   Misc. Devices MISC Pull ups - Medium.  Wipes Diagnosis- incontinence 1 Box 0   Multiple Vitamin (MULTIVITAMIN WITH MINERALS) TABS tablet Take 1 tablet by mouth daily.     pantoprazole  (PROTONIX ) 40 MG tablet Take 1 tablet (40 mg total) by mouth daily. 90 tablet 1    polyethylene glycol powder (MIRALAX ) 17 GM/SCOOP powder Take 17 g by mouth daily. 510 g 0   rosuvastatin  (CRESTOR ) 5 MG tablet Take 1 tablet (5 mg total) by mouth daily. 90 tablet 1   tiZANidine  (ZANAFLEX ) 4 MG tablet Take 1 tablet (4 mg total) by mouth every 8 (eight) hours as needed. 60 tablet 1   No current facility-administered medications on file prior to visit.   "

## 2024-06-27 ENCOUNTER — Telehealth: Payer: Self-pay | Admitting: Family Medicine

## 2024-06-27 NOTE — Telephone Encounter (Signed)
 Copied from CRM [id number]. Topic: Referral - Question >> Jun 27, 2024 11:48 AM   Travis F wrote:  Reason for CRM: Patient is calling in because a referral for physical therapy/rehab was put in for her and she says she was never contacted. She says that Dr. Newlin put in a Dermatology referral but she hadn't heard anything either. After reviewing the chart, saw that both referrals had been closed. Informed patient, patient is requesting new referrals be put in. She said one in [facility] is fine as well.

## 2024-06-27 NOTE — Telephone Encounter (Addendum)
 Hi Altamese,   Could you please assist with this. Thanks!

## 2024-06-28 NOTE — Telephone Encounter (Addendum)
 Hi Maria Oliver,  Patient informed of dermatology referral. She is requesting a new referral for Physical Therapy, also Dr. Newlin prescribed Selenium  Sulfide 2.25% Shampoo that she said medicaid will not cover, she requested an alternative that her insurance will cover.

## 2024-06-28 NOTE — Telephone Encounter (Signed)
 Routing to PCP

## 2024-06-29 ENCOUNTER — Other Ambulatory Visit: Payer: Self-pay | Admitting: Pharmacist

## 2024-06-29 MED ORDER — KETOCONAZOLE 2 % EX SHAM: 1.0000 | MEDICATED_SHAMPOO | CUTANEOUS | 0 refills | Status: AC

## 2024-06-29 NOTE — Telephone Encounter (Signed)
 Patient has been informed.

## 2024-06-29 NOTE — Telephone Encounter (Signed)
 Would you happen to know of any alternative covered by her insurance?  Thank you.

## 2024-08-09 ENCOUNTER — Other Ambulatory Visit (HOSPITAL_BASED_OUTPATIENT_CLINIC_OR_DEPARTMENT_OTHER)

## 2024-10-30 ENCOUNTER — Ambulatory Visit
